# Patient Record
Sex: Female | Born: 1935 | Race: White | Hispanic: No | Smoking: Former smoker
Health system: Southern US, Community
[De-identification: ages and names within clinical notes are randomized; demographics above are authoritative.]

## PROBLEM LIST (undated history)

## (undated) DIAGNOSIS — K624 Stenosis of anus and rectum: Secondary | ICD-10-CM

## (undated) DIAGNOSIS — I4891 Unspecified atrial fibrillation: Secondary | ICD-10-CM

## (undated) DIAGNOSIS — M810 Age-related osteoporosis without current pathological fracture: Secondary | ICD-10-CM

## (undated) DIAGNOSIS — Z974 Presence of external hearing-aid: Secondary | ICD-10-CM

## (undated) DIAGNOSIS — J449 Chronic obstructive pulmonary disease, unspecified: Secondary | ICD-10-CM

## (undated) DIAGNOSIS — Q438 Other specified congenital malformations of intestine: Secondary | ICD-10-CM

## (undated) DIAGNOSIS — Z8719 Personal history of other diseases of the digestive system: Secondary | ICD-10-CM

## (undated) DIAGNOSIS — I10 Essential (primary) hypertension: Secondary | ICD-10-CM

## (undated) DIAGNOSIS — K5730 Diverticulosis of large intestine without perforation or abscess without bleeding: Secondary | ICD-10-CM

## (undated) DIAGNOSIS — K5792 Diverticulitis of intestine, part unspecified, without perforation or abscess without bleeding: Secondary | ICD-10-CM

## (undated) DIAGNOSIS — K529 Noninfective gastroenteritis and colitis, unspecified: Secondary | ICD-10-CM

## (undated) DIAGNOSIS — A310 Pulmonary mycobacterial infection: Secondary | ICD-10-CM

## (undated) DIAGNOSIS — K222 Esophageal obstruction: Secondary | ICD-10-CM

## (undated) DIAGNOSIS — K449 Diaphragmatic hernia without obstruction or gangrene: Secondary | ICD-10-CM

## (undated) DIAGNOSIS — K589 Irritable bowel syndrome without diarrhea: Secondary | ICD-10-CM

## (undated) DIAGNOSIS — J988 Other specified respiratory disorders: Secondary | ICD-10-CM

## (undated) DIAGNOSIS — G4710 Hypersomnia, unspecified: Secondary | ICD-10-CM

## (undated) DIAGNOSIS — K219 Gastro-esophageal reflux disease without esophagitis: Secondary | ICD-10-CM

## (undated) DIAGNOSIS — I2510 Atherosclerotic heart disease of native coronary artery without angina pectoris: Secondary | ICD-10-CM

## (undated) DIAGNOSIS — T4145XA Adverse effect of unspecified anesthetic, initial encounter: Secondary | ICD-10-CM

## (undated) DIAGNOSIS — F411 Generalized anxiety disorder: Secondary | ICD-10-CM

## (undated) DIAGNOSIS — J439 Emphysema, unspecified: Secondary | ICD-10-CM

## (undated) DIAGNOSIS — G4730 Sleep apnea, unspecified: Secondary | ICD-10-CM

## (undated) DIAGNOSIS — N390 Urinary tract infection, site not specified: Secondary | ICD-10-CM

## (undated) DIAGNOSIS — T8859XA Other complications of anesthesia, initial encounter: Secondary | ICD-10-CM

## (undated) DIAGNOSIS — K5909 Other constipation: Secondary | ICD-10-CM

## (undated) DIAGNOSIS — B3781 Candidal esophagitis: Secondary | ICD-10-CM

## (undated) DIAGNOSIS — I7300 Raynaud's syndrome without gangrene: Secondary | ICD-10-CM

## (undated) DIAGNOSIS — K760 Fatty (change of) liver, not elsewhere classified: Secondary | ICD-10-CM

## (undated) DIAGNOSIS — Z8679 Personal history of other diseases of the circulatory system: Secondary | ICD-10-CM

## (undated) DIAGNOSIS — J8410 Pulmonary fibrosis, unspecified: Secondary | ICD-10-CM

## (undated) DIAGNOSIS — C50919 Malignant neoplasm of unspecified site of unspecified female breast: Secondary | ICD-10-CM

## (undated) DIAGNOSIS — J309 Allergic rhinitis, unspecified: Secondary | ICD-10-CM

## (undated) DIAGNOSIS — K8020 Calculus of gallbladder without cholecystitis without obstruction: Secondary | ICD-10-CM

## (undated) HISTORY — DX: Diaphragmatic hernia without obstruction or gangrene: K44.9

## (undated) HISTORY — DX: Sleep apnea, unspecified: G47.30

## (undated) HISTORY — DX: Diverticulosis of large intestine without perforation or abscess without bleeding: K57.30

## (undated) HISTORY — PX: BLADDER SURGERY: SHX569

## (undated) HISTORY — DX: Allergic rhinitis, unspecified: J30.9

## (undated) HISTORY — PX: BILATERAL SALPINGOOPHORECTOMY: SHX1223

## (undated) HISTORY — DX: Pulmonary mycobacterial infection: A31.0

## (undated) HISTORY — DX: Gastro-esophageal reflux disease without esophagitis: K21.9

## (undated) HISTORY — DX: Malignant neoplasm of unspecified site of unspecified female breast: C50.919

## (undated) HISTORY — DX: Personal history of other diseases of the digestive system: Z87.19

## (undated) HISTORY — PX: MOUTH SURGERY: SHX715

## (undated) HISTORY — DX: Irritable bowel syndrome, unspecified: K58.9

## (undated) HISTORY — DX: Diverticulitis of intestine, part unspecified, without perforation or abscess without bleeding: K57.92

## (undated) HISTORY — DX: Chronic obstructive pulmonary disease, unspecified: J44.9

## (undated) HISTORY — DX: Generalized anxiety disorder: F41.1

## (undated) HISTORY — DX: Age-related osteoporosis without current pathological fracture: M81.0

## (undated) HISTORY — DX: Raynaud's syndrome without gangrene: I73.00

## (undated) HISTORY — DX: Fatty (change of) liver, not elsewhere classified: K76.0

## (undated) HISTORY — DX: Noninfective gastroenteritis and colitis, unspecified: K52.9

## (undated) HISTORY — PX: APPENDECTOMY: SHX54

## (undated) HISTORY — PX: TONSILLECTOMY: SUR1361

## (undated) HISTORY — DX: Personal history of other diseases of the circulatory system: Z86.79

## (undated) HISTORY — DX: Unspecified atrial fibrillation: I48.91

## (undated) HISTORY — PX: BREAST CYST EXCISION: SHX579

## (undated) HISTORY — DX: Pulmonary fibrosis, unspecified: J84.10

## (undated) HISTORY — PX: EYE SURGERY: SHX253

## (undated) HISTORY — DX: Hypersomnia, unspecified: G47.10

## (undated) HISTORY — DX: Esophageal obstruction: K22.2

## (undated) HISTORY — DX: Calculus of gallbladder without cholecystitis without obstruction: K80.20

## (undated) HISTORY — PX: SHOULDER OPEN ROTATOR CUFF REPAIR: SHX2407

## (undated) HISTORY — DX: Other specified respiratory disorders: J98.8

## (undated) HISTORY — DX: Candidal esophagitis: B37.81

## (undated) HISTORY — DX: Atherosclerotic heart disease of native coronary artery without angina pectoris: I25.10

## (undated) HISTORY — PX: MELANOMA EXCISION: SHX5266

## (undated) HISTORY — PX: THORACOTOMY: SUR1349

## (undated) HISTORY — DX: Other specified congenital malformations of intestine: Q43.8

## (undated) HISTORY — DX: Emphysema, unspecified: J43.9

## (undated) HISTORY — PX: LUNG SURGERY: SHX703

## (undated) HISTORY — DX: Stenosis of anus and rectum: K62.4

## (undated) HISTORY — DX: Other constipation: K59.09

## (undated) HISTORY — DX: Essential (primary) hypertension: I10

## (undated) HISTORY — PX: ABDOMINAL HYSTERECTOMY: SHX81

---

## 1997-01-11 ENCOUNTER — Encounter: Payer: Self-pay | Admitting: Gastroenterology

## 1997-09-14 ENCOUNTER — Encounter: Admission: RE | Admit: 1997-09-14 | Discharge: 1997-12-13 | Payer: Self-pay | Admitting: Otolaryngology

## 1997-12-25 ENCOUNTER — Encounter: Admission: RE | Admit: 1997-12-25 | Discharge: 1998-03-25 | Payer: Self-pay

## 1998-02-06 ENCOUNTER — Encounter: Admission: RE | Admit: 1998-02-06 | Discharge: 1998-05-02 | Payer: Self-pay | Admitting: Anesthesiology

## 1998-03-12 ENCOUNTER — Encounter: Admission: RE | Admit: 1998-03-12 | Discharge: 1998-06-10 | Payer: Self-pay | Admitting: Anesthesiology

## 1998-09-17 ENCOUNTER — Other Ambulatory Visit: Admission: RE | Admit: 1998-09-17 | Discharge: 1998-09-17 | Payer: Self-pay | Admitting: Gynecology

## 1999-07-05 ENCOUNTER — Encounter: Payer: Self-pay | Admitting: Surgery

## 1999-07-05 ENCOUNTER — Encounter: Admission: RE | Admit: 1999-07-05 | Discharge: 1999-07-05 | Payer: Self-pay | Admitting: Surgery

## 2000-07-24 ENCOUNTER — Encounter: Admission: RE | Admit: 2000-07-24 | Discharge: 2000-07-24 | Payer: Self-pay | Admitting: Surgery

## 2000-07-24 ENCOUNTER — Encounter: Payer: Self-pay | Admitting: Surgery

## 2001-04-02 ENCOUNTER — Encounter: Payer: Self-pay | Admitting: Gastroenterology

## 2001-04-02 ENCOUNTER — Encounter: Admission: RE | Admit: 2001-04-02 | Discharge: 2001-04-02 | Payer: Self-pay | Admitting: Gastroenterology

## 2001-10-22 ENCOUNTER — Encounter: Admission: RE | Admit: 2001-10-22 | Discharge: 2001-10-22 | Payer: Self-pay | Admitting: Critical Care Medicine

## 2001-10-22 ENCOUNTER — Encounter: Payer: Self-pay | Admitting: Critical Care Medicine

## 2001-11-08 ENCOUNTER — Encounter: Admission: RE | Admit: 2001-11-08 | Discharge: 2001-11-08 | Payer: Self-pay | Admitting: Surgery

## 2001-11-08 ENCOUNTER — Encounter: Payer: Self-pay | Admitting: Surgery

## 2002-09-23 ENCOUNTER — Encounter: Payer: Self-pay | Admitting: Internal Medicine

## 2002-09-23 ENCOUNTER — Encounter: Admission: RE | Admit: 2002-09-23 | Discharge: 2002-09-23 | Payer: Self-pay | Admitting: Internal Medicine

## 2003-07-26 ENCOUNTER — Encounter: Admission: RE | Admit: 2003-07-26 | Discharge: 2003-07-26 | Payer: Self-pay | Admitting: Surgery

## 2003-07-28 ENCOUNTER — Ambulatory Visit (HOSPITAL_BASED_OUTPATIENT_CLINIC_OR_DEPARTMENT_OTHER): Admission: RE | Admit: 2003-07-28 | Discharge: 2003-07-28 | Payer: Self-pay | Admitting: Pulmonary Disease

## 2003-11-06 ENCOUNTER — Encounter: Admission: RE | Admit: 2003-11-06 | Discharge: 2003-11-06 | Payer: Self-pay | Admitting: Internal Medicine

## 2003-11-06 ENCOUNTER — Encounter: Payer: Self-pay | Admitting: Internal Medicine

## 2004-03-12 ENCOUNTER — Ambulatory Visit: Payer: Self-pay | Admitting: Critical Care Medicine

## 2004-03-22 ENCOUNTER — Ambulatory Visit: Payer: Self-pay | Admitting: Internal Medicine

## 2004-04-17 ENCOUNTER — Ambulatory Visit: Payer: Self-pay | Admitting: Pulmonary Disease

## 2004-05-09 ENCOUNTER — Ambulatory Visit: Payer: Self-pay | Admitting: Family Medicine

## 2004-05-09 ENCOUNTER — Encounter: Admission: RE | Admit: 2004-05-09 | Discharge: 2004-05-09 | Payer: Self-pay | Admitting: Family Medicine

## 2004-06-10 ENCOUNTER — Ambulatory Visit: Payer: Self-pay | Admitting: Critical Care Medicine

## 2004-07-19 ENCOUNTER — Ambulatory Visit: Payer: Self-pay | Admitting: Internal Medicine

## 2004-08-13 ENCOUNTER — Ambulatory Visit: Payer: Self-pay | Admitting: Internal Medicine

## 2004-08-19 ENCOUNTER — Ambulatory Visit: Payer: Self-pay | Admitting: Critical Care Medicine

## 2004-08-27 ENCOUNTER — Ambulatory Visit: Payer: Self-pay | Admitting: Internal Medicine

## 2004-08-30 ENCOUNTER — Ambulatory Visit: Payer: Self-pay | Admitting: Internal Medicine

## 2004-09-10 ENCOUNTER — Ambulatory Visit: Payer: Self-pay | Admitting: Internal Medicine

## 2004-09-13 ENCOUNTER — Ambulatory Visit: Payer: Self-pay | Admitting: Internal Medicine

## 2004-09-16 ENCOUNTER — Ambulatory Visit: Payer: Self-pay | Admitting: Internal Medicine

## 2004-09-20 ENCOUNTER — Ambulatory Visit: Payer: Self-pay | Admitting: Critical Care Medicine

## 2004-10-11 ENCOUNTER — Ambulatory Visit: Payer: Self-pay | Admitting: Internal Medicine

## 2004-10-18 ENCOUNTER — Ambulatory Visit: Payer: Self-pay | Admitting: Critical Care Medicine

## 2004-11-06 ENCOUNTER — Ambulatory Visit: Payer: Self-pay | Admitting: Internal Medicine

## 2004-12-04 ENCOUNTER — Encounter: Admission: RE | Admit: 2004-12-04 | Discharge: 2004-12-04 | Payer: Self-pay | Admitting: Internal Medicine

## 2004-12-05 ENCOUNTER — Ambulatory Visit: Payer: Self-pay | Admitting: Internal Medicine

## 2004-12-06 ENCOUNTER — Encounter: Payer: Self-pay | Admitting: Gastroenterology

## 2004-12-10 ENCOUNTER — Ambulatory Visit: Payer: Self-pay | Admitting: Internal Medicine

## 2004-12-19 ENCOUNTER — Ambulatory Visit: Payer: Self-pay | Admitting: Critical Care Medicine

## 2005-01-14 ENCOUNTER — Encounter: Payer: Self-pay | Admitting: Gastroenterology

## 2005-03-11 ENCOUNTER — Ambulatory Visit: Payer: Self-pay | Admitting: Critical Care Medicine

## 2005-03-14 ENCOUNTER — Encounter: Admission: RE | Admit: 2005-03-14 | Discharge: 2005-03-14 | Payer: Self-pay | Admitting: Internal Medicine

## 2005-03-18 ENCOUNTER — Ambulatory Visit: Payer: Self-pay | Admitting: Internal Medicine

## 2005-03-19 ENCOUNTER — Ambulatory Visit: Payer: Self-pay | Admitting: Cardiology

## 2005-03-28 ENCOUNTER — Ambulatory Visit: Payer: Self-pay | Admitting: Internal Medicine

## 2005-06-17 ENCOUNTER — Ambulatory Visit: Payer: Self-pay | Admitting: Critical Care Medicine

## 2005-06-23 ENCOUNTER — Ambulatory Visit: Payer: Self-pay | Admitting: Internal Medicine

## 2005-07-21 ENCOUNTER — Ambulatory Visit: Payer: Self-pay | Admitting: Internal Medicine

## 2005-08-04 ENCOUNTER — Encounter: Payer: Self-pay | Admitting: Critical Care Medicine

## 2005-08-06 ENCOUNTER — Ambulatory Visit: Payer: Self-pay | Admitting: Internal Medicine

## 2005-08-13 ENCOUNTER — Ambulatory Visit: Payer: Self-pay | Admitting: Internal Medicine

## 2005-09-11 ENCOUNTER — Encounter: Payer: Self-pay | Admitting: Critical Care Medicine

## 2005-09-16 ENCOUNTER — Ambulatory Visit: Payer: Self-pay | Admitting: Internal Medicine

## 2005-09-22 ENCOUNTER — Ambulatory Visit: Payer: Self-pay | Admitting: Critical Care Medicine

## 2005-10-02 ENCOUNTER — Ambulatory Visit: Payer: Self-pay | Admitting: Internal Medicine

## 2005-10-05 ENCOUNTER — Encounter: Payer: Self-pay | Admitting: Critical Care Medicine

## 2005-10-23 ENCOUNTER — Ambulatory Visit: Payer: Self-pay | Admitting: Pulmonary Disease

## 2005-11-10 ENCOUNTER — Ambulatory Visit: Payer: Self-pay | Admitting: Internal Medicine

## 2005-12-04 ENCOUNTER — Ambulatory Visit (HOSPITAL_BASED_OUTPATIENT_CLINIC_OR_DEPARTMENT_OTHER): Admission: RE | Admit: 2005-12-04 | Discharge: 2005-12-04 | Payer: Self-pay | Admitting: Pulmonary Disease

## 2005-12-12 ENCOUNTER — Ambulatory Visit: Payer: Self-pay | Admitting: Pulmonary Disease

## 2005-12-30 ENCOUNTER — Ambulatory Visit: Payer: Self-pay | Admitting: Internal Medicine

## 2006-01-06 ENCOUNTER — Ambulatory Visit: Payer: Self-pay | Admitting: Critical Care Medicine

## 2006-01-14 ENCOUNTER — Ambulatory Visit: Payer: Self-pay | Admitting: Pulmonary Disease

## 2006-01-28 ENCOUNTER — Ambulatory Visit: Payer: Self-pay | Admitting: Internal Medicine

## 2006-02-24 ENCOUNTER — Ambulatory Visit: Payer: Self-pay

## 2006-03-04 ENCOUNTER — Ambulatory Visit: Payer: Self-pay | Admitting: Internal Medicine

## 2006-03-06 ENCOUNTER — Ambulatory Visit: Payer: Self-pay | Admitting: Internal Medicine

## 2006-03-09 ENCOUNTER — Ambulatory Visit: Payer: Self-pay | Admitting: Critical Care Medicine

## 2006-03-23 ENCOUNTER — Encounter: Admission: RE | Admit: 2006-03-23 | Discharge: 2006-03-23 | Payer: Self-pay | Admitting: Internal Medicine

## 2006-04-14 ENCOUNTER — Ambulatory Visit: Payer: Self-pay | Admitting: Internal Medicine

## 2006-05-08 ENCOUNTER — Ambulatory Visit: Payer: Self-pay | Admitting: Critical Care Medicine

## 2006-05-25 ENCOUNTER — Ambulatory Visit: Payer: Self-pay | Admitting: Critical Care Medicine

## 2006-06-01 ENCOUNTER — Ambulatory Visit: Payer: Self-pay | Admitting: Pulmonary Disease

## 2006-06-23 ENCOUNTER — Ambulatory Visit: Payer: Self-pay | Admitting: Critical Care Medicine

## 2006-06-24 ENCOUNTER — Ambulatory Visit: Payer: Self-pay | Admitting: *Deleted

## 2006-06-30 ENCOUNTER — Ambulatory Visit: Payer: Self-pay | Admitting: Internal Medicine

## 2006-06-30 LAB — CONVERTED CEMR LAB
ALT: 32 units/L (ref 0–40)
AST: 43 units/L — ABNORMAL HIGH (ref 0–37)
Albumin: 3.9 g/dL (ref 3.5–5.2)
Alkaline Phosphatase: 56 units/L (ref 39–117)
BUN: 8 mg/dL (ref 6–23)
Basophils Absolute: 0 10*3/uL (ref 0.0–0.1)
Basophils Relative: 0.3 % (ref 0.0–1.0)
Bilirubin, Direct: 0.1 mg/dL (ref 0.0–0.3)
CO2: 32 meq/L (ref 19–32)
Calcium: 9.7 mg/dL (ref 8.4–10.5)
Chloride: 101 meq/L (ref 96–112)
Cholesterol: 201 mg/dL (ref 0–200)
Creatinine, Ser: 0.9 mg/dL (ref 0.4–1.2)
Direct LDL: 131.5 mg/dL
Eosinophils Absolute: 0.1 10*3/uL (ref 0.0–0.6)
Eosinophils Relative: 1.4 % (ref 0.0–5.0)
GFR calc Af Amer: 77 mL/min
GFR calc non Af Amer: 64 mL/min
Glucose, Bld: 105 mg/dL — ABNORMAL HIGH (ref 70–99)
HCT: 42.6 % (ref 36.0–46.0)
HDL: 57.3 mg/dL (ref >39.0)
Hemoglobin: 14.6 g/dL (ref 12.0–15.0)
Lymphocytes Relative: 27.4 % (ref 12.0–46.0)
MCHC: 34.2 g/dL (ref 30.0–36.0)
MCV: 90.5 fL (ref 78.0–100.0)
Monocytes Absolute: 0.6 10*3/uL (ref 0.2–0.7)
Monocytes Relative: 10.7 % (ref 3.0–11.0)
Neutro Abs: 3.2 10*3/uL (ref 1.4–7.7)
Neutrophils Relative %: 60.2 % (ref 43.0–77.0)
Platelets: 221 10*3/uL (ref 150–400)
Potassium: 4.9 meq/L (ref 3.5–5.1)
RBC: 4.71 M/uL (ref 3.87–5.11)
RDW: 12.8 % (ref 11.5–14.6)
Sodium: 140 meq/L (ref 135–145)
Total Bilirubin: 0.9 mg/dL (ref 0.3–1.2)
Total CHOL/HDL Ratio: 3.5
Total Protein: 6.7 g/dL (ref 6.0–8.3)
Triglycerides: 120 mg/dL (ref 0–149)
VLDL: 24 mg/dL (ref 0–40)
WBC: 5.4 10*3/uL (ref 4.5–10.5)

## 2006-07-28 ENCOUNTER — Ambulatory Visit: Payer: Self-pay | Admitting: Critical Care Medicine

## 2006-08-12 ENCOUNTER — Ambulatory Visit: Payer: Self-pay | Admitting: Critical Care Medicine

## 2006-08-12 LAB — CONVERTED CEMR LAB
ALT: 36 units/L (ref 0–40)
Basophils Absolute: 0 10*3/uL (ref 0.0–0.1)
CK-MB: 2.1 ng/mL (ref 0.3–4.0)
Chloride: 104 meq/L (ref 96–112)
Creatinine, Ser: 0.9 mg/dL (ref 0.4–1.2)
Eosinophils Absolute: 0.1 10*3/uL (ref 0.0–0.6)
Glucose, Bld: 113 mg/dL — ABNORMAL HIGH (ref 70–99)
HCT: 44.1 % (ref 36.0–46.0)
MCHC: 34.5 g/dL (ref 30.0–36.0)
MCV: 89.3 fL (ref 78.0–100.0)
Monocytes Absolute: 0.4 10*3/uL (ref 0.2–0.7)
Monocytes Relative: 7.5 % (ref 3.0–11.0)
Neutrophils Relative %: 61.5 % (ref 43.0–77.0)
Pro B Natriuretic peptide (BNP): 70 pg/mL (ref 0.0–100.0)
RBC: 4.94 M/uL (ref 3.87–5.11)
Sodium: 140 meq/L (ref 135–145)

## 2006-09-24 ENCOUNTER — Ambulatory Visit: Payer: Self-pay | Admitting: Pulmonary Disease

## 2006-10-09 ENCOUNTER — Ambulatory Visit: Payer: Self-pay | Admitting: Internal Medicine

## 2006-10-09 LAB — CONVERTED CEMR LAB
Basophils Absolute: 0 10*3/uL (ref 0.0–0.1)
Basophils Relative: 0.1 % (ref 0.0–1.0)
Eosinophils Absolute: 0.1 10*3/uL (ref 0.0–0.6)
Eosinophils Relative: 0.9 % (ref 0.0–5.0)
Folate: >20 ng/mL
HCT: 46.1 % — ABNORMAL HIGH (ref 36.0–46.0)
Hemoglobin: 15.6 g/dL — ABNORMAL HIGH (ref 12.0–15.0)
Lymphocytes Relative: 28.8 % (ref 12.0–46.0)
MCHC: 33.9 g/dL (ref 30.0–36.0)
MCV: 89.5 fL (ref 78.0–100.0)
Monocytes Absolute: 0.6 10*3/uL (ref 0.2–0.7)
Monocytes Relative: 8.9 % (ref 3.0–11.0)
Neutro Abs: 3.9 10*3/uL (ref 1.4–7.7)
Neutrophils Relative %: 61.3 % (ref 43.0–77.0)
Platelets: 211 10*3/uL (ref 150–400)
RBC: 5.15 M/uL — ABNORMAL HIGH (ref 3.87–5.11)
RDW: 12.8 % (ref 11.5–14.6)
TSH: 1.52 microintl units/mL (ref 0.35–5.50)
Vitamin B-12: 423 pg/mL (ref 211–911)
WBC: 6.5 10*3/uL (ref 4.5–10.5)

## 2006-10-19 ENCOUNTER — Ambulatory Visit: Payer: Self-pay | Admitting: Internal Medicine

## 2006-11-11 ENCOUNTER — Ambulatory Visit: Payer: Self-pay | Admitting: Internal Medicine

## 2006-11-12 DIAGNOSIS — I10 Essential (primary) hypertension: Secondary | ICD-10-CM

## 2006-11-12 HISTORY — DX: Essential (primary) hypertension: I10

## 2006-11-18 ENCOUNTER — Ambulatory Visit: Payer: Self-pay | Admitting: Critical Care Medicine

## 2007-01-27 ENCOUNTER — Ambulatory Visit: Payer: Self-pay | Admitting: Critical Care Medicine

## 2007-02-01 DIAGNOSIS — A310 Pulmonary mycobacterial infection: Secondary | ICD-10-CM

## 2007-02-01 DIAGNOSIS — G4710 Hypersomnia, unspecified: Secondary | ICD-10-CM

## 2007-02-01 DIAGNOSIS — G4730 Sleep apnea, unspecified: Secondary | ICD-10-CM

## 2007-02-01 HISTORY — DX: Pulmonary mycobacterial infection: A31.0

## 2007-02-01 HISTORY — DX: Hypersomnia, unspecified: G47.10

## 2007-02-16 ENCOUNTER — Ambulatory Visit: Payer: Self-pay | Admitting: Internal Medicine

## 2007-02-17 LAB — CONVERTED CEMR LAB
ALT: 40 U/L — ABNORMAL HIGH
AST: 41 U/L — ABNORMAL HIGH
Albumin: 4 g/dL
Alkaline Phosphatase: 60 U/L
BUN: 9 mg/dL
Basophils Absolute: 0 K/uL
Basophils Relative: 0.2 %
Bilirubin, Direct: 0.2 mg/dL
CO2: 32 meq/L
Calcium: 9.5 mg/dL
Chloride: 98 meq/L
Creatinine, Ser: 0.9 mg/dL
Eosinophils Absolute: 0.1 K/uL
Eosinophils Relative: 1.4 %
Folate: >20 ng/mL
GFR calc Af Amer: 77 mL/min
GFR calc non Af Amer: 64 mL/min
Glucose, Bld: 83 mg/dL
HCT: 45 %
Hemoglobin: 15.4 g/dL — ABNORMAL HIGH
Lymphocytes Relative: 26.3 %
MCHC: 34.2 g/dL
MCV: 92.1 fL
Monocytes Absolute: 0.5 K/uL
Monocytes Relative: 8.6 %
Neutro Abs: 4 K/uL
Neutrophils Relative %: 63.5 %
Platelets: 194 K/uL
Potassium: 5.5 meq/L — ABNORMAL HIGH
RBC: 4.88 M/uL
RDW: 12.6 %
Sodium: 137 meq/L
TSH: 1.67 u[IU]/mL
Total Bilirubin: 1.1 mg/dL
Total Protein: 6.5 g/dL
Vitamin B-12: 314 pg/mL
WBC: 6.3 10*3/microliter

## 2007-03-08 ENCOUNTER — Ambulatory Visit: Payer: Self-pay | Admitting: Internal Medicine

## 2007-04-14 ENCOUNTER — Encounter: Payer: Self-pay | Admitting: Internal Medicine

## 2007-04-14 ENCOUNTER — Encounter: Payer: Self-pay | Admitting: Gastroenterology

## 2007-04-19 ENCOUNTER — Ambulatory Visit: Payer: Self-pay | Admitting: Critical Care Medicine

## 2007-04-19 DIAGNOSIS — K219 Gastro-esophageal reflux disease without esophagitis: Secondary | ICD-10-CM

## 2007-04-19 HISTORY — DX: Gastro-esophageal reflux disease without esophagitis: K21.9

## 2007-04-21 ENCOUNTER — Ambulatory Visit: Payer: Self-pay | Admitting: Internal Medicine

## 2007-05-03 ENCOUNTER — Telehealth (INDEPENDENT_AMBULATORY_CARE_PROVIDER_SITE_OTHER): Payer: Self-pay | Admitting: *Deleted

## 2007-05-17 ENCOUNTER — Ambulatory Visit: Payer: Self-pay | Admitting: Internal Medicine

## 2007-06-16 ENCOUNTER — Encounter: Admission: RE | Admit: 2007-06-16 | Discharge: 2007-06-16 | Payer: Self-pay | Admitting: Internal Medicine

## 2007-06-28 ENCOUNTER — Encounter (INDEPENDENT_AMBULATORY_CARE_PROVIDER_SITE_OTHER): Payer: Self-pay | Admitting: Diagnostic Radiology

## 2007-06-28 ENCOUNTER — Encounter: Payer: Self-pay | Admitting: Internal Medicine

## 2007-06-28 ENCOUNTER — Encounter: Admission: RE | Admit: 2007-06-28 | Discharge: 2007-06-28 | Payer: Self-pay | Admitting: Internal Medicine

## 2007-06-29 ENCOUNTER — Telehealth: Payer: Self-pay | Admitting: Internal Medicine

## 2007-07-02 ENCOUNTER — Ambulatory Visit: Payer: Self-pay | Admitting: Critical Care Medicine

## 2007-07-05 ENCOUNTER — Telehealth: Payer: Self-pay | Admitting: Critical Care Medicine

## 2007-07-06 ENCOUNTER — Encounter: Payer: Self-pay | Admitting: Internal Medicine

## 2007-07-07 ENCOUNTER — Ambulatory Visit: Payer: Self-pay | Admitting: Internal Medicine

## 2007-07-08 ENCOUNTER — Encounter: Admission: RE | Admit: 2007-07-08 | Discharge: 2007-07-08 | Payer: Self-pay | Admitting: Internal Medicine

## 2007-07-14 ENCOUNTER — Encounter: Admission: RE | Admit: 2007-07-14 | Discharge: 2007-07-14 | Payer: Self-pay | Admitting: Internal Medicine

## 2007-07-21 ENCOUNTER — Ambulatory Visit (HOSPITAL_COMMUNITY): Admission: RE | Admit: 2007-07-21 | Discharge: 2007-07-22 | Payer: Self-pay | Admitting: Surgery

## 2007-07-21 ENCOUNTER — Encounter: Admission: RE | Admit: 2007-07-21 | Discharge: 2007-07-21 | Payer: Self-pay | Admitting: Surgery

## 2007-07-21 ENCOUNTER — Encounter (INDEPENDENT_AMBULATORY_CARE_PROVIDER_SITE_OTHER): Payer: Self-pay | Admitting: Surgery

## 2007-07-21 ENCOUNTER — Encounter: Payer: Self-pay | Admitting: Internal Medicine

## 2007-07-21 HISTORY — PX: MASTECTOMY: SHX3

## 2007-07-21 HISTORY — PX: BREAST SURGERY: SHX581

## 2007-07-28 ENCOUNTER — Encounter: Payer: Self-pay | Admitting: Internal Medicine

## 2007-07-29 ENCOUNTER — Ambulatory Visit: Payer: Self-pay | Admitting: Oncology

## 2007-08-05 ENCOUNTER — Ambulatory Visit: Admission: RE | Admit: 2007-08-05 | Discharge: 2007-11-03 | Payer: Self-pay | Admitting: Radiation Oncology

## 2007-08-06 ENCOUNTER — Encounter: Payer: Self-pay | Admitting: Internal Medicine

## 2007-08-09 ENCOUNTER — Encounter: Payer: Self-pay | Admitting: Internal Medicine

## 2007-08-19 ENCOUNTER — Ambulatory Visit: Payer: Self-pay | Admitting: Critical Care Medicine

## 2007-09-29 ENCOUNTER — Ambulatory Visit: Payer: Self-pay | Admitting: Critical Care Medicine

## 2007-10-25 ENCOUNTER — Telehealth: Payer: Self-pay | Admitting: Critical Care Medicine

## 2007-10-29 ENCOUNTER — Encounter: Payer: Self-pay | Admitting: Critical Care Medicine

## 2007-11-10 ENCOUNTER — Encounter: Payer: Self-pay | Admitting: Critical Care Medicine

## 2007-11-10 ENCOUNTER — Ambulatory Visit: Payer: Self-pay | Admitting: Internal Medicine

## 2007-11-10 LAB — CONVERTED CEMR LAB
Glucose, Urine, Semiquant: NEGATIVE
Nitrite: NEGATIVE
Protein, U semiquant: NEGATIVE
Urobilinogen, UA: 1

## 2007-11-11 ENCOUNTER — Telehealth: Payer: Self-pay | Admitting: Internal Medicine

## 2007-11-29 ENCOUNTER — Encounter: Payer: Self-pay | Admitting: Critical Care Medicine

## 2007-12-22 ENCOUNTER — Ambulatory Visit: Payer: Self-pay | Admitting: Critical Care Medicine

## 2007-12-28 ENCOUNTER — Telehealth: Payer: Self-pay | Admitting: Internal Medicine

## 2007-12-29 ENCOUNTER — Ambulatory Visit: Payer: Self-pay | Admitting: Internal Medicine

## 2007-12-30 ENCOUNTER — Encounter: Payer: Self-pay | Admitting: Internal Medicine

## 2007-12-30 LAB — CONVERTED CEMR LAB
Chloride: 101 meq/L (ref 96–112)
Direct LDL: 138.7 mg/dL
GFR calc Af Amer: 77 mL/min
GFR calc non Af Amer: 64 mL/min
HDL: 52.5 mg/dL (ref >39.0)
Potassium: 4.4 meq/L (ref 3.5–5.1)
Sodium: 140 meq/L (ref 135–145)
Total CHOL/HDL Ratio: 3.8
VLDL: 17 mg/dL (ref 0–40)

## 2008-02-01 ENCOUNTER — Telehealth: Payer: Self-pay | Admitting: Internal Medicine

## 2008-02-03 ENCOUNTER — Telehealth: Payer: Self-pay | Admitting: Internal Medicine

## 2008-02-03 ENCOUNTER — Encounter: Payer: Self-pay | Admitting: Internal Medicine

## 2008-02-15 ENCOUNTER — Ambulatory Visit: Payer: Self-pay | Admitting: Internal Medicine

## 2008-02-15 DIAGNOSIS — C50919 Malignant neoplasm of unspecified site of unspecified female breast: Secondary | ICD-10-CM

## 2008-02-15 HISTORY — DX: Malignant neoplasm of unspecified site of unspecified female breast: C50.919

## 2008-02-16 ENCOUNTER — Encounter: Payer: Self-pay | Admitting: Internal Medicine

## 2008-03-03 ENCOUNTER — Ambulatory Visit: Payer: Self-pay | Admitting: Critical Care Medicine

## 2008-03-14 ENCOUNTER — Telehealth: Payer: Self-pay | Admitting: Internal Medicine

## 2008-03-14 ENCOUNTER — Encounter: Payer: Self-pay | Admitting: Gastroenterology

## 2008-03-15 ENCOUNTER — Telehealth: Payer: Self-pay | Admitting: Internal Medicine

## 2008-03-16 ENCOUNTER — Telehealth: Payer: Self-pay | Admitting: Gastroenterology

## 2008-03-20 ENCOUNTER — Telehealth: Payer: Self-pay | Admitting: Internal Medicine

## 2008-04-04 DIAGNOSIS — M810 Age-related osteoporosis without current pathological fracture: Secondary | ICD-10-CM | POA: Insufficient documentation

## 2008-04-04 HISTORY — DX: Age-related osteoporosis without current pathological fracture: M81.0

## 2008-04-07 ENCOUNTER — Ambulatory Visit: Payer: Self-pay | Admitting: Internal Medicine

## 2008-04-07 ENCOUNTER — Ambulatory Visit: Payer: Self-pay | Admitting: Gastroenterology

## 2008-04-07 ENCOUNTER — Telehealth: Payer: Self-pay | Admitting: Internal Medicine

## 2008-04-07 DIAGNOSIS — K5730 Diverticulosis of large intestine without perforation or abscess without bleeding: Secondary | ICD-10-CM | POA: Insufficient documentation

## 2008-04-07 HISTORY — DX: Diverticulosis of large intestine without perforation or abscess without bleeding: K57.30

## 2008-04-07 LAB — CONVERTED CEMR LAB
Bilirubin Urine: NEGATIVE
Ketones, urine, test strip: NEGATIVE
Specific Gravity, Urine: 1.02

## 2008-04-08 ENCOUNTER — Encounter: Payer: Self-pay | Admitting: Internal Medicine

## 2008-04-21 ENCOUNTER — Ambulatory Visit: Payer: Self-pay | Admitting: Pulmonary Disease

## 2008-05-03 ENCOUNTER — Ambulatory Visit: Payer: Self-pay | Admitting: Cardiology

## 2008-05-03 ENCOUNTER — Ambulatory Visit: Payer: Self-pay | Admitting: Internal Medicine

## 2008-05-03 ENCOUNTER — Inpatient Hospital Stay (HOSPITAL_COMMUNITY): Admission: AD | Admit: 2008-05-03 | Discharge: 2008-05-04 | Payer: Self-pay | Admitting: Internal Medicine

## 2008-05-10 ENCOUNTER — Ambulatory Visit: Payer: Self-pay | Admitting: Gastroenterology

## 2008-05-16 ENCOUNTER — Ambulatory Visit: Payer: Self-pay | Admitting: Critical Care Medicine

## 2008-05-17 ENCOUNTER — Telehealth: Payer: Self-pay | Admitting: Gastroenterology

## 2008-05-17 ENCOUNTER — Telehealth: Payer: Self-pay | Admitting: Internal Medicine

## 2008-05-19 ENCOUNTER — Ambulatory Visit: Payer: Self-pay | Admitting: Gastroenterology

## 2008-05-19 ENCOUNTER — Encounter: Payer: Self-pay | Admitting: Gastroenterology

## 2008-05-24 ENCOUNTER — Encounter: Payer: Self-pay | Admitting: Gastroenterology

## 2008-05-24 ENCOUNTER — Ambulatory Visit: Payer: Self-pay

## 2008-05-24 ENCOUNTER — Encounter: Payer: Self-pay | Admitting: Internal Medicine

## 2008-05-24 ENCOUNTER — Encounter: Payer: Self-pay | Admitting: Cardiology

## 2008-05-24 ENCOUNTER — Encounter: Payer: Self-pay | Admitting: Critical Care Medicine

## 2008-05-31 ENCOUNTER — Ambulatory Visit: Payer: Self-pay | Admitting: Cardiology

## 2008-06-05 ENCOUNTER — Ambulatory Visit: Payer: Self-pay | Admitting: Internal Medicine

## 2008-06-19 ENCOUNTER — Telehealth: Payer: Self-pay | Admitting: Internal Medicine

## 2008-07-17 ENCOUNTER — Encounter: Admission: RE | Admit: 2008-07-17 | Discharge: 2008-07-17 | Payer: Self-pay | Admitting: Surgery

## 2008-08-16 ENCOUNTER — Ambulatory Visit: Payer: Self-pay | Admitting: Gastroenterology

## 2008-08-16 DIAGNOSIS — F411 Generalized anxiety disorder: Secondary | ICD-10-CM | POA: Insufficient documentation

## 2008-08-16 HISTORY — DX: Generalized anxiety disorder: F41.1

## 2008-08-23 ENCOUNTER — Ambulatory Visit (HOSPITAL_COMMUNITY): Admission: RE | Admit: 2008-08-23 | Discharge: 2008-08-23 | Payer: Self-pay | Admitting: Gastroenterology

## 2008-08-23 DIAGNOSIS — K8020 Calculus of gallbladder without cholecystitis without obstruction: Secondary | ICD-10-CM

## 2008-08-23 HISTORY — DX: Calculus of gallbladder without cholecystitis without obstruction: K80.20

## 2008-08-30 ENCOUNTER — Ambulatory Visit: Payer: Self-pay | Admitting: Internal Medicine

## 2008-09-04 ENCOUNTER — Ambulatory Visit: Payer: Self-pay | Admitting: Critical Care Medicine

## 2008-09-27 ENCOUNTER — Encounter: Payer: Self-pay | Admitting: Gastroenterology

## 2008-09-27 ENCOUNTER — Encounter: Payer: Self-pay | Admitting: Cardiology

## 2008-10-07 DIAGNOSIS — Z8679 Personal history of other diseases of the circulatory system: Secondary | ICD-10-CM | POA: Insufficient documentation

## 2008-10-07 HISTORY — DX: Personal history of other diseases of the circulatory system: Z86.79

## 2008-10-11 ENCOUNTER — Ambulatory Visit: Payer: Self-pay | Admitting: Internal Medicine

## 2008-10-16 LAB — CONVERTED CEMR LAB
Basophils Relative: 0.7 % (ref 0.0–3.0)
Eosinophils Absolute: 0.1 10*3/uL (ref 0.0–0.7)
Eosinophils Relative: 2.9 % (ref 0.0–5.0)
Lymphocytes Relative: 29.1 % (ref 12.0–46.0)
MCHC: 34.1 g/dL (ref 30.0–36.0)
MCV: 93.6 fL (ref 78.0–100.0)
Monocytes Absolute: 0.6 10*3/uL (ref 0.1–1.0)
Neutrophils Relative %: 54.5 % (ref 43.0–77.0)
Platelets: 153 10*3/uL (ref 150.0–400.0)
RBC: 4.41 M/uL (ref 3.87–5.11)
WBC: 4.6 10*3/uL (ref 4.5–10.5)

## 2008-10-17 ENCOUNTER — Ambulatory Visit: Payer: Self-pay | Admitting: Cardiology

## 2008-10-17 DIAGNOSIS — I2510 Atherosclerotic heart disease of native coronary artery without angina pectoris: Secondary | ICD-10-CM

## 2008-10-17 HISTORY — DX: Atherosclerotic heart disease of native coronary artery without angina pectoris: I25.10

## 2008-10-18 ENCOUNTER — Encounter: Payer: Self-pay | Admitting: Internal Medicine

## 2008-10-18 ENCOUNTER — Ambulatory Visit (HOSPITAL_COMMUNITY): Admission: RE | Admit: 2008-10-18 | Discharge: 2008-10-18 | Payer: Self-pay | Admitting: Surgery

## 2008-10-23 ENCOUNTER — Telehealth: Payer: Self-pay | Admitting: Internal Medicine

## 2008-11-06 ENCOUNTER — Ambulatory Visit: Payer: Self-pay | Admitting: Critical Care Medicine

## 2008-11-08 ENCOUNTER — Ambulatory Visit: Payer: Self-pay | Admitting: Internal Medicine

## 2008-11-08 LAB — CONVERTED CEMR LAB
Basophils Relative: 0.2 % (ref 0.0–3.0)
Eosinophils Absolute: 0.1 10*3/uL (ref 0.0–0.7)
Eosinophils Relative: 3.3 % (ref 0.0–5.0)
Hemoglobin: 15 g/dL (ref 12.0–15.0)
Lymphocytes Relative: 29.2 % (ref 12.0–46.0)
MCHC: 34.9 g/dL (ref 30.0–36.0)
Neutro Abs: 2.3 10*3/uL (ref 1.4–7.7)
Neutrophils Relative %: 54.5 % (ref 43.0–77.0)
RBC: 4.63 M/uL (ref 3.87–5.11)
WBC: 4.3 10*3/uL — ABNORMAL LOW (ref 4.5–10.5)

## 2008-12-06 ENCOUNTER — Telehealth (INDEPENDENT_AMBULATORY_CARE_PROVIDER_SITE_OTHER): Payer: Self-pay | Admitting: *Deleted

## 2008-12-08 ENCOUNTER — Encounter: Payer: Self-pay | Admitting: Cardiology

## 2008-12-08 ENCOUNTER — Encounter: Payer: Self-pay | Admitting: Internal Medicine

## 2008-12-29 ENCOUNTER — Ambulatory Visit: Payer: Self-pay | Admitting: Critical Care Medicine

## 2008-12-31 ENCOUNTER — Encounter: Payer: Self-pay | Admitting: Critical Care Medicine

## 2009-01-08 ENCOUNTER — Telehealth: Payer: Self-pay | Admitting: Internal Medicine

## 2009-01-26 ENCOUNTER — Ambulatory Visit: Payer: Self-pay | Admitting: Internal Medicine

## 2009-01-29 LAB — CONVERTED CEMR LAB
ALT: 35 units/L (ref 0–35)
AST: 48 units/L — ABNORMAL HIGH (ref 0–37)
Albumin: 4 g/dL (ref 3.5–5.2)
Alkaline Phosphatase: 65 units/L (ref 39–117)
BUN: 7 mg/dL (ref 6–23)
CO2: 30 meq/L (ref 19–32)
Calcium: 9.3 mg/dL (ref 8.4–10.5)
Chloride: 106 meq/L (ref 96–112)
Creatinine, Ser: 0.9 mg/dL (ref 0.4–1.2)
GFR calc non Af Amer: 63.52 mL/min (ref >60)
Glucose, Bld: 104 mg/dL — ABNORMAL HIGH (ref 70–99)
Potassium: 5.3 meq/L — ABNORMAL HIGH (ref 3.5–5.1)
Sodium: 141 meq/L (ref 135–145)
Total Bilirubin: 1.5 mg/dL — ABNORMAL HIGH (ref 0.3–1.2)
Total Protein: 7.1 g/dL (ref 6.0–8.3)

## 2009-02-07 ENCOUNTER — Telehealth: Payer: Self-pay | Admitting: Internal Medicine

## 2009-02-28 ENCOUNTER — Ambulatory Visit: Payer: Self-pay | Admitting: Internal Medicine

## 2009-03-01 LAB — CONVERTED CEMR LAB
BUN: 8 mg/dL (ref 6–23)
Chloride: 102 meq/L (ref 96–112)
GFR calc non Af Amer: 84.88 mL/min (ref >60)
Potassium: 4.9 meq/L (ref 3.5–5.1)
Sodium: 138 meq/L (ref 135–145)

## 2009-03-09 ENCOUNTER — Ambulatory Visit: Payer: Self-pay | Admitting: Critical Care Medicine

## 2009-04-13 ENCOUNTER — Encounter (INDEPENDENT_AMBULATORY_CARE_PROVIDER_SITE_OTHER): Payer: Self-pay | Admitting: *Deleted

## 2009-04-25 ENCOUNTER — Ambulatory Visit: Payer: Self-pay | Admitting: Internal Medicine

## 2009-04-25 DIAGNOSIS — I7300 Raynaud's syndrome without gangrene: Secondary | ICD-10-CM

## 2009-04-25 HISTORY — DX: Raynaud's syndrome without gangrene: I73.00

## 2009-05-28 ENCOUNTER — Telehealth: Payer: Self-pay | Admitting: Internal Medicine

## 2009-06-19 ENCOUNTER — Ambulatory Visit: Payer: Self-pay | Admitting: Cardiology

## 2009-06-22 ENCOUNTER — Encounter: Admission: RE | Admit: 2009-06-22 | Discharge: 2009-06-22 | Payer: Self-pay | Admitting: Surgery

## 2009-07-02 ENCOUNTER — Telehealth: Payer: Self-pay | Admitting: Internal Medicine

## 2009-07-06 ENCOUNTER — Telehealth: Payer: Self-pay | Admitting: Internal Medicine

## 2009-07-09 ENCOUNTER — Ambulatory Visit: Payer: Self-pay | Admitting: Critical Care Medicine

## 2009-07-12 ENCOUNTER — Telehealth: Payer: Self-pay | Admitting: Internal Medicine

## 2009-07-20 ENCOUNTER — Encounter: Admission: RE | Admit: 2009-07-20 | Discharge: 2009-07-20 | Payer: Self-pay | Admitting: Surgery

## 2009-07-24 ENCOUNTER — Encounter: Payer: Self-pay | Admitting: Internal Medicine

## 2009-08-03 ENCOUNTER — Encounter: Payer: Self-pay | Admitting: Internal Medicine

## 2009-08-03 ENCOUNTER — Encounter: Payer: Self-pay | Admitting: Gastroenterology

## 2009-08-23 ENCOUNTER — Ambulatory Visit: Payer: Self-pay | Admitting: Internal Medicine

## 2009-09-03 ENCOUNTER — Encounter: Payer: Self-pay | Admitting: Gastroenterology

## 2009-09-05 LAB — CONVERTED CEMR LAB
ALT: 20 units/L (ref 0–35)
AST: 27 units/L (ref 0–37)
BUN: 9 mg/dL (ref 6–23)
Basophils Relative: 0.4 % (ref 0.0–3.0)
Bilirubin, Direct: 0.1 mg/dL (ref 0.0–0.3)
Calcium: 9.3 mg/dL (ref 8.4–10.5)
Eosinophils Absolute: 0.1 10*3/uL (ref 0.0–0.7)
Eosinophils Relative: 1.5 % (ref 0.0–5.0)
GFR calc non Af Amer: 72.67 mL/min (ref >60)
Glucose, Bld: 91 mg/dL (ref 70–99)
HCT: 42.9 % (ref 36.0–46.0)
Lymphs Abs: 1.6 10*3/uL (ref 0.7–4.0)
MCHC: 34.6 g/dL (ref 30.0–36.0)
MCV: 92.2 fL (ref 78.0–100.0)
Monocytes Absolute: 0.6 10*3/uL (ref 0.1–1.0)
Platelets: 177 10*3/uL (ref 150.0–400.0)
Sodium: 137 meq/L (ref 135–145)
Total Bilirubin: 0.6 mg/dL (ref 0.3–1.2)
WBC: 6.2 10*3/uL (ref 4.5–10.5)

## 2009-09-12 ENCOUNTER — Encounter: Payer: Self-pay | Admitting: Gastroenterology

## 2009-09-21 ENCOUNTER — Ambulatory Visit: Payer: Self-pay | Admitting: Critical Care Medicine

## 2009-09-21 ENCOUNTER — Encounter: Payer: Self-pay | Admitting: Internal Medicine

## 2009-10-01 ENCOUNTER — Ambulatory Visit: Payer: Self-pay | Admitting: Internal Medicine

## 2009-10-01 ENCOUNTER — Encounter: Payer: Self-pay | Admitting: Internal Medicine

## 2009-10-15 ENCOUNTER — Telehealth: Payer: Self-pay | Admitting: Pulmonary Disease

## 2009-10-17 ENCOUNTER — Encounter: Payer: Self-pay | Admitting: Pulmonary Disease

## 2009-11-26 ENCOUNTER — Telehealth: Payer: Self-pay | Admitting: Critical Care Medicine

## 2009-11-29 ENCOUNTER — Ambulatory Visit: Payer: Self-pay | Admitting: Diagnostic Radiology

## 2009-11-29 ENCOUNTER — Ambulatory Visit (HOSPITAL_BASED_OUTPATIENT_CLINIC_OR_DEPARTMENT_OTHER): Admission: RE | Admit: 2009-11-29 | Discharge: 2009-11-29 | Payer: Self-pay | Admitting: Critical Care Medicine

## 2009-11-29 ENCOUNTER — Ambulatory Visit: Payer: Self-pay | Admitting: Critical Care Medicine

## 2009-11-30 ENCOUNTER — Ambulatory Visit: Payer: Self-pay | Admitting: Internal Medicine

## 2009-12-10 ENCOUNTER — Telehealth (INDEPENDENT_AMBULATORY_CARE_PROVIDER_SITE_OTHER): Payer: Self-pay | Admitting: *Deleted

## 2009-12-17 ENCOUNTER — Ambulatory Visit: Payer: Self-pay | Admitting: Critical Care Medicine

## 2009-12-17 ENCOUNTER — Telehealth (INDEPENDENT_AMBULATORY_CARE_PROVIDER_SITE_OTHER): Payer: Self-pay | Admitting: *Deleted

## 2009-12-19 ENCOUNTER — Telehealth (INDEPENDENT_AMBULATORY_CARE_PROVIDER_SITE_OTHER): Payer: Self-pay | Admitting: *Deleted

## 2009-12-20 ENCOUNTER — Telehealth: Payer: Self-pay | Admitting: Internal Medicine

## 2010-01-04 ENCOUNTER — Ambulatory Visit: Payer: Self-pay | Admitting: Internal Medicine

## 2010-01-04 DIAGNOSIS — J4489 Other specified chronic obstructive pulmonary disease: Secondary | ICD-10-CM

## 2010-01-04 DIAGNOSIS — J439 Emphysema, unspecified: Secondary | ICD-10-CM

## 2010-01-04 DIAGNOSIS — J449 Chronic obstructive pulmonary disease, unspecified: Secondary | ICD-10-CM

## 2010-01-04 HISTORY — DX: Chronic obstructive pulmonary disease, unspecified: J44.9

## 2010-01-04 HISTORY — DX: Other specified chronic obstructive pulmonary disease: J44.89

## 2010-01-09 ENCOUNTER — Ambulatory Visit: Payer: Self-pay | Admitting: Critical Care Medicine

## 2010-01-30 ENCOUNTER — Telehealth (INDEPENDENT_AMBULATORY_CARE_PROVIDER_SITE_OTHER): Payer: Self-pay | Admitting: *Deleted

## 2010-02-25 ENCOUNTER — Encounter: Payer: Self-pay | Admitting: Internal Medicine

## 2010-03-01 ENCOUNTER — Ambulatory Visit: Payer: Self-pay | Admitting: Internal Medicine

## 2010-03-07 ENCOUNTER — Telehealth: Payer: Self-pay | Admitting: Internal Medicine

## 2010-03-11 ENCOUNTER — Encounter
Admission: RE | Admit: 2010-03-11 | Discharge: 2010-04-12 | Payer: Self-pay | Source: Home / Self Care | Admitting: Internal Medicine

## 2010-03-12 ENCOUNTER — Telehealth: Payer: Self-pay | Admitting: Internal Medicine

## 2010-03-13 ENCOUNTER — Encounter: Payer: Self-pay | Admitting: Internal Medicine

## 2010-03-14 ENCOUNTER — Encounter: Payer: Self-pay | Admitting: Internal Medicine

## 2010-03-15 ENCOUNTER — Ambulatory Visit: Payer: Self-pay | Admitting: Critical Care Medicine

## 2010-04-02 ENCOUNTER — Telehealth (INDEPENDENT_AMBULATORY_CARE_PROVIDER_SITE_OTHER): Payer: Self-pay | Admitting: *Deleted

## 2010-04-03 ENCOUNTER — Encounter: Payer: Self-pay | Admitting: Critical Care Medicine

## 2010-04-25 ENCOUNTER — Telehealth: Payer: Self-pay | Admitting: Critical Care Medicine

## 2010-05-11 ENCOUNTER — Ambulatory Visit
Admission: RE | Admit: 2010-05-11 | Discharge: 2010-05-11 | Payer: Self-pay | Source: Home / Self Care | Attending: Family Medicine | Admitting: Family Medicine

## 2010-05-14 ENCOUNTER — Telehealth: Payer: Self-pay | Admitting: Internal Medicine

## 2010-05-15 ENCOUNTER — Ambulatory Visit
Admission: RE | Admit: 2010-05-15 | Discharge: 2010-05-15 | Payer: Self-pay | Source: Home / Self Care | Attending: Critical Care Medicine | Admitting: Critical Care Medicine

## 2010-05-15 DIAGNOSIS — M25569 Pain in unspecified knee: Secondary | ICD-10-CM | POA: Insufficient documentation

## 2010-05-17 ENCOUNTER — Encounter: Payer: Self-pay | Admitting: Critical Care Medicine

## 2010-05-17 ENCOUNTER — Encounter: Payer: Self-pay | Admitting: Internal Medicine

## 2010-05-28 ENCOUNTER — Ambulatory Visit
Admission: RE | Admit: 2010-05-28 | Discharge: 2010-05-28 | Payer: Self-pay | Source: Home / Self Care | Attending: Internal Medicine | Admitting: Internal Medicine

## 2010-06-02 ENCOUNTER — Encounter: Payer: Self-pay | Admitting: Surgery

## 2010-06-02 ENCOUNTER — Encounter: Payer: Self-pay | Admitting: Internal Medicine

## 2010-06-13 NOTE — Assessment & Plan Note (Signed)
Summary: Pulmonary OV   Copy to:  [name] Primary Provider/Referring Provider:  Birdie Sons, MD  CC:  4 month follow up.  states breathing is worse-having increased SOB with activity and chest pressure in left chest-states these sxs have gotten worse over the past 2 months.  states she has a "hacky cough every now and then."  .  History of Present Illness: Pulmonary OV:    This is an 77 yo patient with a history of COPD and mycobacterium avium intracellulare with  chronic granulomatous lung disease.  The pt also has hx of upper airway obstruction with difficult intubation with prior operative procedures. Also hx Breast CA s/p resection and XRT. Hx Gallstone disease with conservative Rx due to difficult intubation status.  March 09, 2009 2:35 PM No new issues.  Dyspnea is at baseline.  Chronic pn drip noted. Pt denies any significant sore throat, nasal congestion or excess secretions, fever, chills, sweats, unintended weight loss, pleurtic or exertional chest pain, orthopnea PND, or leg swelling Pt denies any increase in rescue therapy over baseline, denies waking up needing it or having any early am or nocturnal exacerbations of coughing/wheezing/or dyspnea.   July 09, 2009 3:23 PM since last ov spo2 98% dyspnea has been up and down. saw T wall few weeks ago, BP was soft, cut bp meds in 1/2???  pt did not f/u no real cough now. pt has a dry hacky cough.  pt is running around and trying to get house on the market has a pain on the L side.  had an u/s on gallstones.  gallstones are more numerous and are larger.   Preventive Screening-Counseling & Management  Alcohol-Tobacco     Smoking Status: never  Current Medications (verified): 1)  Aspirin 325 Mg Tabs (Aspirin) .... Once Daily 2)  Oxygen .... 2l At Bedtime 3)  Polyethylene Glycol 1000   Powd (Polyethylene Glycol 1000) .... 1/2 Dose Every Day 4)  Benadryl 25 Mg  Caps (Diphenhydramine Hcl) .... Take 1  Capsule By Mouth At Bedtime 5)  Diazepam 5 Mg Tabs (Diazepam) .... 1/2 Tablet By Mouth At Bedtime 6)  Nitroglycerin 0.4 Mg Subl (Nitroglycerin) .... One Tablet Under Tongue Every 5 Minutes As Needed For Chest Pain---May Repeat Times Three 7)  Afrin Nasal Spray 0.05 % Soln (Oxymetazoline Hcl) .... At Bedtime 8)  Losartan Potassium 100 Mg Tabs (Losartan Potassium) .... Take 1 Tablet By Mouth Once A Day 9)  Actigall 300 Mg Caps (Ursodiol) .... Two Times A Day  Allergies (verified): 1)  ! * Z Pack 2)  Penicillin G Potassium (Penicillin G Potassium) 3)  Spiriva Handihaler (Tiotropium Bromide Monohydrate) 4)  Sulfamethoxazole (Sulfamethoxazole)  Past History:  Past medical, surgical, family and social histories (including risk factors) reviewed, and no changes noted (except as noted below).  Past Medical History: Reviewed history from 01/26/2009 and no changes required. MITRAL VALVE PROLAPSE, HX OF (ICD-V12.50) ATRIAL FIBRILLATION (ICD-427.31) CHEST PAIN (ICD-786.50) COPD (ICD-496)...  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 Hysterectomy age 4 Oophorectomy, bilateral SO age 51 prolapsed bladder S/P surgery granuloma,  pulmonary-thoracotomy Breast Surgery: partial mastectomy R 07/21/07 for breast CA all margins neg and LNs neg Tonsillectomy Appendectomy Rotator Cuff  Repair  Family History: Reviewed history from 04/07/2008 and no changes required. father had parkinsonism. Mother died in her 33s of old age. Cancer in the family  Family History of Colon Cancer:Aunt (diagnosed age 41) Family History of Uterine Cancer:Aunt   Social History: Reviewed history from 04/07/2008 and no changes required. lives alone widow Former Smoker Alcohol Use - yes Illicit Drug Use - no  Smoking Status:  never  Review of Systems       The patient complains of shortness of breath with activity.  The patient denies shortness of breath at rest, productive cough, non-productive cough, coughing up blood, chest pain, irregular heartbeats, acid heartburn, indigestion, loss of appetite, weight change, abdominal pain, difficulty swallowing, sore throat, tooth/dental problems, headaches, nasal congestion/difficulty breathing through nose, sneezing, itching, ear ache, anxiety, depression, hand/feet swelling, joint stiffness or pain, rash, change in color of mucus, and fever.    Vital Signs:  Patient profile:   77 year old female Height:      61 inches Weight:      135 pounds O2 Sat:      98 % on Room air Temp:     97.8 degrees F oral Pulse rate:   70 / minute BP sitting:   118 / 70  (left arm) Cuff size:   regular  Vitals Entered By: Gweneth Dimitri RN (July 09, 2009 3:17 PM)  O2 Flow:  Room air CC: 4 month follow up.  states breathing is worse-having increased SOB with activity and chest pressure in left chest-states these sxs have gotten worse over the past 2 months.  states she has a "hacky cough every now and then."   Comments Medications reviewed with patient Daytime contact number verified with patient. Gweneth Dimitri RN  July 09, 2009 3:17 PM    Physical Exam  Additional Exam:  No acute distress with stable vital signs. HEENT:  Unremarkable.  Oropharynx is clear. LUNG FIELDS:  clear HEART:  There is a regular rhythm without murmur, gallop,  rub. ABDOMEN:  Soft, non-tender EXTREMITIES:  Warm without calf tenderness, cyanosis, clubbing, edema.      Impression & Recommendations:  Problem # 1:  COPD (ICD-496) Assessment Unchanged  doing well Subglottic stenosis,  COPD stable at this time, CXR stable 8/10 plan: cont inhaled meds as prescribed  Medications Added to Medication List This Visit: 1)  Afrin Nasal Spray 0.05 % Soln (Oxymetazoline hcl) .... At bedtime 2)  Actigall 300 Mg Caps (Ursodiol) .... Two times a day  Complete Medication List: 1)  Aspirin 325 Mg Tabs (Aspirin) .... Once daily 2)  Oxygen  .... 2l at bedtime 3)  Polyethylene Glycol 1000 Powd (Polyethylene glycol 1000) .... 1/2 dose every day 4)  Benadryl 25 Mg Caps (Diphenhydramine hcl) .... Take 1 capsule by mouth at bedtime 5)  Diazepam 5 Mg Tabs (Diazepam) .... 1/2 tablet by mouth at bedtime 6)  Nitroglycerin 0.4 Mg Subl (Nitroglycerin) .... One tablet under tongue every 5 minutes as needed for chest pain---may repeat times three 7)  Afrin Nasal Spray 0.05 % Soln (Oxymetazoline hcl) .... At bedtime 8)  Losartan Potassium 100 Mg Tabs (Losartan potassium) .... Take 1 tablet by mouth once a day 9)  Actigall 300 Mg Caps (Ursodiol) .... Two times a day  Other Orders: Est. Patient Level III (46962)  Patient Instructions: 1)  No changes in medications 2)  Return 3 months

## 2010-06-13 NOTE — Assessment & Plan Note (Signed)
Summary: Pulmonary OV   Copy to:  [name] Primary Provider/Referring Provider:  Birdie Sons, MD  CC:  3 mos, Pt C/O dizziness & fattigue X4 wks, pt states she has not had any syncope episides. Pt also complains of SOB upon exertion x 4wks, and she states she is having a productive cough in the morning that brings up a yellowish mucus x 4 weeks as well.Marland Kitchen  History of Present Illness: Pulmonary OV:    This is an 77 yo patient with a history of COPD and mycobacterium avium intracellulare with  chronic granulomatous lung disease.  The pt also has hx of upper airway obstruction with difficult intubation with prior operative procedures. Also hx Breast CA s/p resection and XRT. Hx Gallstone disease with conservative Rx due to difficult intubation status.  March 09, 2009 2:35 PM No new issues.  Dyspnea is at baseline.  Chronic pn drip noted. Pt denies any significant sore throat, nasal congestion or excess secretions, fever, chills, sweats, unintended weight loss, pleurtic or exertional chest pain, orthopnea PND, or leg swelling Pt denies any increase in rescue therapy over baseline, denies waking up needing it or having any early am or nocturnal exacerbations of coughing/wheezing/or dyspnea.   July 09, 2009 3:23 PM since last ov spo2 98% dyspnea has been up and down. saw T wall few weeks ago, BP was soft, cut bp meds in 1/2???  pt did not f/u no real cough now. pt has a dry hacky cough.  pt is running around and trying to get house on the market has a pain on the L side.  had an u/s on gallstones.  gallstones are more numerous and are larger.  Sep 21, 2009 2:42 PM  [name] to [facility]  for opinion for GB surgery.  Saw a PA .   Surgeon said no further surgery. Is still on Actigall.  Dr Ezzard Standing.   Also rx protonx has scar in LLs  and nodules due to MAI Still dyspneic and has dry cough.   Pt is still on nasal oxygen 2L.    Clinical Reports  Reviewed:  PFT's:  12/31/2008: FEF 25/75 %Predicted:  90 FEV1 %Predicted:  108 FVC %Predicted:  86   Preventive Screening-Counseling & Management  Alcohol-Tobacco     Smoking Status: quit > 6 months  Current Medications (verified): 1)  Aspirin 325 Mg Tabs (Aspirin) .... Once Daily 2)  Oxygen .... 2l At Bedtime 3)  Polyethylene Glycol 1000   Powd (Polyethylene Glycol 1000) .... 1/2 Dose Every Day 4)  Benadryl 25 Mg  Caps (Diphenhydramine Hcl) .... Take 1 Capsule By Mouth At Bedtime 5)  Nitroglycerin 0.4 Mg Subl (Nitroglycerin) .... One Tablet Under Tongue Every 5 Minutes As Needed For Chest Pain---May Repeat Times Three 6)  Afrin Nasal Spray 0.05 % Soln (Oxymetazoline Hcl) .... At Bedtime As Needed 7)  Losartan Potassium 100 Mg Tabs (Losartan Potassium) .... Take 1 Tablet By Mouth Once A Day  Allergies: 1)  ! * Z Pack 2)  ! * Shellfish 3)  Penicillin G Potassium (Penicillin G Potassium) 4)  Spiriva Handihaler (Tiotropium Bromide Monohydrate) 5)  Sulfamethoxazole (Sulfamethoxazole)  Past History:  Past medical, surgical, family and social histories (including risk factors) reviewed, and no changes noted (except as noted below).  Past Medical History: Reviewed history from 01/26/2009 and no changes required. MITRAL VALVE PROLAPSE, HX OF (ICD-V12.50) ATRIAL FIBRILLATION (ICD-427.31) CHEST PAIN (ICD-786.50) COPD (ICD-496)...  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 Hysterectomy age 16 Oophorectomy, bilateral SO age 30 prolapsed bladder S/P surgery granuloma, pulmonary-thoracotomy Breast Surgery: partial mastectomy R 07/21/07 for breast CA all margins neg and LNs neg Tonsillectomy Appendectomy Rotator Cuff Repair  Family History: Reviewed history from 04/07/2008 and no changes required. father had parkinsonism. Mother died in her 53s of old age. Cancer in the family  Family History of Colon Cancer:Aunt (diagnosed age 10) Family History of Uterine Cancer:Aunt   Social History: Reviewed history from 04/07/2008 and no changes required. lives alone widow Former Smoker Alcohol Use - yes Illicit Drug Use - no  Smoking Status:  quit > 6 months  Review of Systems       The patient complains of shortness of breath with activity and non-productive cough.  The patient denies shortness of breath at rest, productive cough, coughing up blood, chest pain, irregular heartbeats, acid heartburn, indigestion, loss of appetite, weight change, abdominal pain, difficulty swallowing, sore throat, tooth/dental problems, headaches, nasal congestion/difficulty breathing through nose, sneezing, itching, ear ache, anxiety, depression, hand/feet swelling, joint stiffness or pain, rash, change in color of mucus, and fever.    Vital Signs:  Patient profile:   77 year old female Height:      61 inches Weight:      134 pounds O2 Sat:      96 % on room air Temp:     97.9 degrees F oral Pulse rate:   66 / minute BP sitting:   120 / 68  (left arm) Cuff size:   regular  Vitals Entered By: Denna Haggard, CMA (Sep 21, 2009 2:32 PM)  O2 Sat at Rest %:  96% O2 Flow:  room air  Reason for Visit 3 month ROV patient states she thinks her new problems are accompanied by her moving from her home and being exposed to dust. Denna Haggard, CMA  Sep 21, 2009  2:37 PM   Physical Exam  Additional Exam:  No acute distress with stable vital signs. HEENT:  Unremarkable.  Oropharynx is clear. LUNG FIELDS:  clear HEART:  There is a regular rhythm without murmur, gallop, rub. ABDOMEN:  Soft, non-tender EXTREMITIES:  Warm without calf tenderness, cyanosis, clubbing, edema.      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 Patient Level III (09323)  Problem # 2:  GERD (ICD-530.81) Assessment: Unchanged  On ppi  Orders: Est. Patient Level IV (55732)  Problem # 3:  COPD (ICD-496) Assessment: Unchanged stable copd golds stage II,  spirometry stable,  historically intolerant of DPIs plan no changes  Complete Medication List: 1)  Aspirin  325 Mg Tabs (Aspirin) .... Once daily 2)  Oxygen  .... 2l at bedtime 3)  Polyethylene Glycol 1000 Powd (Polyethylene glycol 1000) .... 1/2 dose every day 4)  Benadryl 25 Mg Caps (Diphenhydramine hcl) .... Take 1 capsule by mouth at bedtime 5)  Nitroglycerin 0.4 Mg Subl (Nitroglycerin) .... One tablet under tongue every 5 minutes as needed for chest pain---may repeat times three 6)  Afrin Nasal Spray 0.05 % Soln (Oxymetazoline hcl) .... At bedtime as needed 7)  Losartan Potassium 100 Mg Tabs (Losartan potassium) .... Take 1 tablet by mouth once a day  Patient Instructions: 1)  No change in medications 2)  Return in     3     months     CardioPerfect Spirometry  ID: [id number] Patient: [name] [name], [name] [name] DOB: 1923/08/05 Age: 77 Years Old Sex: Female Race: White Physician: Birdie Sons  MD Height: 61 Weight: 134 Status: Confirmed Past Medical History:  MITRAL VALVE PROLAPSE, HX OF (ICD-V12.50) ATRIAL FIBRILLATION (ICD-427.31) CHEST PAIN (ICD-786.50) COPD (ICD-496)...  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

## 2010-06-13 NOTE — Assessment & Plan Note (Signed)
Summary: 4 month rov/njrpt rescd from [facility]//ccm   Vital Signs:  Patient profile:   77 year old female Weight:      133 pounds Temp:     98.8 degrees F oral Pulse rate:   72 / minute Pulse rhythm:   regular Resp:     12 per minute BP sitting:   106 / 78  (left arm) Cuff size:   regular  Vitals Entered By: Gladis Riffle, RN (August 23, 2009 12:19 PM) CC: 4 month rov--discuss [facility] consult--takes 1/2 diazepam at bedtime and another 1/2 when wakes up in middle of night per self Is Patient Diabetic? No   Primary Care Provider:  Birdie Sons, MD  CC:  4 month rov--discuss [facility] consult--takes 1/2 diazepam at bedtime and another 1/2 when wakes up in middle of night per self.  History of Present Illness:  Follow-Up Visit      This is an 77 year old woman who presents for Follow-up visit.  The patient denies chest pain and palpitations.  Since the last visit the patient notes being seen by a specialist.  The patient reports taking meds as prescribed.  When questioned about possible medication side effects, the patient notes none.  reviewed [facility] note regarding gall stones (discussed with dr Delford Field previously). She has noted that abdominal discomfort related to cholelithiasis have minimized.   All other systems reviewed and were negative   Preventive Screening-Counseling & Management  Alcohol-Tobacco     Smoking Status: never     Year Started: 1942     Year Quit: 1990     Pack years: 21  Current Problems (verified): 1)  Raynaud's Syndrome  (ICD-443.0) 2)  Cad, Unspecified Site  (ICD-414.00) 3)  Mitral Valve Prolapse, Hx of  (ICD-V12.50) 4)  COPD  (ICD-496) 5)  Hypertension  (ICD-401.9) 6)  Gerd  (ICD-530.81) 7)  Cholelithiasis  (ICD-574.20) 8)  Diverticulosis-colon  (ICD-562.10) 9)  Osteoporosis  (ICD-733.00) 10)  Airway Obstruction  (ICD-519.8) 11)  Neoplasm, Malignant, Breast, Right  (ICD-174.9) 12)  Hypersomnia, Associated With Sleep Apnea  (ICD-780.53) 13)  Disease,  Pulmonary D/t Mycobacteria  (ICD-031.0) 14)  Allergic Rhinitis  (ICD-477.9)  Current Medications (verified): 1)  Aspirin 325 Mg Tabs (Aspirin) .... Once Daily 2)  Oxygen .... 2l At Bedtime 3)  Polyethylene Glycol 1000   Powd (Polyethylene Glycol 1000) .... 1/2 Dose Every Day 4)  Benadryl 25 Mg  Caps (Diphenhydramine Hcl) .... Take 1 Capsule By Mouth At Bedtime 5)  Diazepam 5 Mg Tabs (Diazepam) .... 1/2 Tablet By Mouth At Bedtime, Repeats Once As Needed 6)  Nitroglycerin 0.4 Mg Subl (Nitroglycerin) .... One Tablet Under Tongue Every 5 Minutes As Needed For Chest Pain---May Repeat Times Three 7)  Afrin Nasal Spray 0.05 % Soln (Oxymetazoline Hcl) .... At Bedtime As Needed 8)  Losartan Potassium 100 Mg Tabs (Losartan Potassium) .... Take 1 Tablet By Mouth Once A Day 9)  Actigall 300 Mg Caps (Ursodiol) .... Two Times A Day 10)  Nasacort Aq 55 Mcg/act Aers (Triamcinolone Acetonide(Nasal)) .... One Spray Each Nostril At Bedtime  Allergies: 1)  ! * Z Pack 2)  ! * Shellfish 3)  Penicillin G Potassium (Penicillin G Potassium) 4)  Spiriva Handihaler (Tiotropium Bromide Monohydrate) 5)  Sulfamethoxazole (Sulfamethoxazole)  Past History:  Past Medical History: Last updated: 01/26/2009 MITRAL VALVE PROLAPSE, HX OF (ICD-V12.50) ATRIAL FIBRILLATION (ICD-427.31) CHEST PAIN (ICD-786.50) COPD (ICD-496)...  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 Mother died in her 42s of old age. Cancer in the family  Family History of Colon Cancer:Aunt (diagnosed age 72) Family History of Uterine Cancer:Aunt   Social History: Last updated: 04/07/2008 lives alone widow Former Smoker Alcohol Use - yes Illicit Drug Use - no   Risk Factors: Smoking Status: never (08/23/2009)  Physical Exam  General:  Well developed, well nourished, in no acute distress. Head:  normocephalic and atraumatic.   Eyes:  pupils equal and pupils round.   Ears:  R ear normal and L ear normal.   Nose:  no external deformity and no external erythema.   Neck:  No deformities, masses, or tenderness noted. Chest Wall:  no deformities.   Lungs:  normal respiratory effort and no intercostal retractions.   Heart:  normal rate and regular rhythm.   Abdomen:  soft and non-tender.   Msk:  No deformity or scoliosis noted of thoracic or lumbar spine.   Neurologic:  cranial nerves II-XII intact and gait normal.     Impression & Recommendations:  Problem # 1:  CHOLELITHIASIS (ICD-574.20)  minimal sxs if any conitnue Actigall she had a Contrast CT---will [facility] from [facility]  Orders: Venipuncture (16109) TLB-Hepatic/Liver Function Pnl (80076-HEPATIC) TLB-CBC Platelet - w/Differential (85025-CBCD)  Problem # 2:  GERD (ICD-530.81) she is not taking any PPI and is doing well   Problem # 3:  HYPERTENSION (ICD-401.9)  controlled continue current medications  Her updated medication list for this problem includes:    Losartan Potassium 100 Mg Tabs (Losartan potassium) .Marland Kitchen... Take 1 tablet by mouth once a day  BP  today: 106/78 Prior BP: 118/70 (07/09/2009)  Labs Reviewed: K+: 4.9 (02/28/2009) Creat: : 0.7 (02/28/2009)   Chol: 201 (12/29/2007)   HDL: 52.5 (12/29/2007)   LDL: DEL (12/29/2007)   TG: 84 (12/29/2007)  Orders: TLB-BMP (Basic Metabolic Panel-BMET) (80048-METABOL)  Problem # 4:  NEOPLASM, MALIGNANT, BREAST, RIGHT (ICD-174.9) no known recurrence  Problem # 5:  CAD, UNSPECIFIED SITE (ICD-414.00) nonobstructive dzs Her updated medication list for this problem includes:    Aspirin 325 Mg Tabs (Aspirin) ..... Once daily    Nitroglycerin 0.4 Mg Subl (Nitroglycerin) ..... One tablet under tongue every 5 minutes as needed for chest pain---may repeat times three    Losartan Potassium 100 Mg Tabs (Losartan potassium) .Marland Kitchen... Take 1 tablet by mouth once a day she has a significant amount of stress related to life changes (moving, selling a home, etc...) she describes a reaction to shellfish  Complete Medication List: 1)  Aspirin 325 Mg Tabs (Aspirin) .... Once daily 2)  Oxygen  .... 2l at bedtime 3)  Polyethylene Glycol 1000 Powd (Polyethylene glycol 1000) .... 1/2 dose every day 4)  Benadryl 25 Mg Caps (Diphenhydramine hcl) .... Take 1 capsule by mouth at bedtime 5)  Diazepam 5 Mg Tabs (Diazepam) .... Take 1 tab by mouth at bedtime 6)  Nitroglycerin 0.4 Mg Subl (Nitroglycerin) .... One tablet under tongue  every 5 minutes as needed for chest pain---may repeat times three 7)  Afrin Nasal Spray 0.05 % Soln (Oxymetazoline hcl) .... At bedtime as needed 8)  Losartan Potassium 100 Mg Tabs (Losartan potassium) .... Take 1 tablet by mouth once a day 9)  Actigall 300 Mg Caps (Ursodiol) .... Two times a day 10)  Nasacort Aq 55 Mcg/act Aers (Triamcinolone acetonide(nasal)) .... One spray each nostril at bedtime  Patient Instructions: 1)  Please schedule a follow-up appointment in 4 months. Prescriptions: DIAZEPAM 5 MG TABS (DIAZEPAM) Take 1 tab by mouth at bedtime  #30 x 3   Entered and Authorized  by:   Birdie Sons MD   Signed by:   Birdie Sons MD on 08/23/2009   Method used:   Print then Give to Patient   RxID:   [id number]

## 2010-06-13 NOTE — Progress Notes (Signed)
Summary: returned call  Phone Note Call from Patient Call back at Home Phone [phone]   Caller: Patient Call For: wright Summary of Call: pt says someone called her but she couldn't get to the phone.  Initial call taken by: Tivis Ringer, CNA,  December 19, 2009 12:51 PM  Follow-up for Phone Call        I do not see anything in EMR as to why the patient may have been called. PW or Crystal, did either one of you call the patient, if so please advise and I dont mind calling her for you. Thanks.Reynaldo Minium CMA  December 19, 2009 2:42 PM   I have not tried to call this pt.  Dr. Delford Field, did you try calling her? Gweneth Dimitri RN  December 19, 2009 3:55 PM   Additional Follow-up for Phone Call Additional follow up Details #1::        I have not tried to call this pt Additional Follow-up by: Storm Frisk MD,  December 19, 2009 4:35 PM    Additional Follow-up for Phone Call Additional follow up Details #2::    Patient is aware no one has tried to call her from this office. She says meant to call Dr. Cato Mulligan office. Follow-up by: Michel Bickers CMA,  December 19, 2009 4:54 PM

## 2010-06-13 NOTE — Consult Note (Signed)
Summary: [facility]  [facility]   Imported By: Maryln Gottron 08/29/2009 14:35:33  _____________________________________________________________________  External Attachment:    Type:   Image     Comment:   External Document

## 2010-06-13 NOTE — Progress Notes (Signed)
Summary: TALK TO NURSE  Phone Note Call from Patient   Caller: Patient Call For: Tinsley Everman Summary of Call: PT HAD PNUEMONIA WHILE ON VACATION WAS TREATED AND FEELING BETTER WOULD LIKE TO KNOW IF SHE NEED TO HAVE AN OV SOONER THAN 8/8. Initial call taken by: Rickard Patience,  November 26, 2009 8:59 AM  Follow-up for Phone Call        called and spoke with pt.  pt states she recently got back from vacation to [place].  Pt states when she left for this trip she noted a sore throat.  Pt states while in [place] sore throat worsened and she was seen by urgent care MD and dx with sinus infection and given a zpak.  Pt states she "got worse" over the days and went to another urgent care and was in "acute critical condition" with sats in the 60s and dx with pna.  Pt states she was hospitalized at [facility] (pt has hosp records with her) x 6 days.  Pt states she was d/c'd from hosp on 11/10/2009.  Pt flew back to [place] yesterday.  Pt states she is now "feeling better."  States her sats are 94% RA.  Pt denies a cough, sore throat or increased sob.  Pt states she just feels "weak."  Pt has pending appt with PW for 12-17-2009.  Pt wanted to know since these recent events if he wanted to see her sooner.   Lorain Childes- pt does have an appt with Dr. Cato Mulligan this Friday 7-22).  Pelase advise.  Aundra Millet Reynolds LPN  November 26, 2009 10:17 AM   Additional Follow-up for Phone Call Additional follow up Details #1::        she will need to be seen this week ?[place]? CXR first  Additional Follow-up by: Storm Frisk MD,  November 26, 2009 10:21 AM  New Problems: PNEUMONIA, COMMUNITY ACQUIRED, PNEUMOCOCCAL (ICD-481)   Additional Follow-up for Phone Call Additional follow up Details #2::    called and spoke with pt and she agreed to appt on thursday am with PW in [facility] office--pt is aware to arrive a little early and have cxr prior to ov.  gave pt address and phone number in case she gets lost. Randell Loop CMA  November 26, 2009 5:07 PM   New Problems: PNEUMONIA, COMMUNITY ACQUIRED, PNEUMOCOCCAL (ICD-481)

## 2010-06-13 NOTE — Progress Notes (Signed)
Summary: records/triage   Phone Note Call from Patient Call back at Home Phone [phone]   Caller: Patient Call For: Nerea Bordenave Reason for Call: Talk to Nurse Details for Reason: records/triage Summary of Call: would like status on M?R from Dr Aurora Mask office ---pt has Colitis and needs to be seen soon  Initial call taken by: Guadlupe Spanish [facility],  March 16, 2008 9:51 AM  Follow-up for Phone Call        Pt wanted to know if we have recieved records from Dr. Jennye Boroughs office. I informed pt that we do not have them yet. Pt demanded to be seen tomorrow by Dr. Russella Dar. I told pt that Dr. Russella Dar will not see her until we have the records to review. I told pt to call her PCP to be seen sooner and she states she tried and they refused her a appt and referred her to [place]. Pt states she will call Dr. Jennye Boroughs office and will call me back.  Follow-up by: Christie Nottingham CMA,  March 16, 2008 10:05 AM  Additional Follow-up for Phone Call Additional follow up Details #1::        Pt states that Dr. Jennye Boroughs office mailed records to [place] on Tuesday. I told pt that we have not recieved the records yet but as soon as the doctor reviews them and accepts her as a pt then we can schedule her appt. Pt verbalized understanding.  Additional Follow-up by: Christie Nottingham CMA,  March 16, 2008 12:13 PM      Appended Document: records/triage spoke to the patient again today and she would like to know if we have recieved her records yet, I advised her that we have not and Marchelle Folks would call her after Dr. Russella Dar has reviewed the records.

## 2010-06-13 NOTE — Progress Notes (Signed)
Summary: speak to nurse  Phone Note Call from Patient Call back at Home Phone [phone]   Caller: Patient Call For: wright Summary of Call: Wants to order a portable O2 meter from [facility], pls advise. Initial call taken by: Darletta Moll,  December 10, 2009 3:30 PM  Follow-up for Phone Call        Spoke with pt.  She states would like order for her own pulse ox.  She feels like she would really benefit this and wants order sent asap.  Will forward to doc of the day in PW's absense.  Please advise, thanks! Follow-up by: Vernie Murders,  December 10, 2009 4:41 PM  Additional Follow-up for Phone Call Additional follow up Details #1::        We can give her a script for this, but it wont always matter (won't always help w insurance, etc). Sh ecan buy one at pharmacy but they are costly. We can write her a script if she wants to take it with he rto pharm.  Additional Follow-up by: Leslye Peer MD,  December 10, 2009 5:31 PM    Additional Follow-up for Phone Call Additional follow up Details #2::    called and spoke with pt.  pt aware of RB's response.  Pt would still like rx for pulse ox.  had RB sign rx for pulse ox and mailed to pt's home adress per pt's request which i verified we had correct home address on file.  Arman Filter LPN  December 10, 2009 5:38 PM    Appended Document: speak to nurse agree  pw

## 2010-06-13 NOTE — Assessment & Plan Note (Signed)
Summary: leg pain//ccm   Vital Signs:  Patient profile:   77 year old female Weight:      136 pounds Temp:     98.6 degrees F oral Pulse rate:   72 / minute Pulse rhythm:   regular BP sitting:   142 / 88  (left arm) Cuff size:   regular  Vitals Entered By: Alfred Levins, CMA (May 28, 2010 11:11 AM) CC: f/u on leg pain   Primary Care Provider:  Birdie Sons, MD  CC:  f/u on leg pain.  History of Present Illness: reviewed Dr. Ernst Spell note from December she comes in now complaining of left knee pain---she starts her hx with a long description of a knee injury in the 1980s. she tells me that she had a fractured tibia at that s he---no surgical intervention.   She states thatshe fell this fall when trimming a tree. She has some discomfort all of the time and notes most of the pain when walking.  Pain intensity: 8/10 at times.  she tells me that she has seen ortho: I have called for the consult note. she has tried [facility]  She has a list of medical concerns: she wonders about b12 deficiency, she wonders about OA of back, she complains of fatigue  Current Medications (verified): 1)  Aspirin 325 Mg Tabs (Aspirin) .... Once Daily 2)  Oxygen .... 2l At Night, You May Take Oxygen Off At Rest 3)  Polyethylene Glycol 1000   Powd (Polyethylene Glycol 1000) .... 1/2 Dose Every Day 4)  Nitroglycerin 0.4 Mg Subl (Nitroglycerin) .... One Tablet Under Tongue Every 5 Minutes As Needed For Chest Pain---May Repeat Times Three 5)  Losartan Potassium 100 Mg Tabs (Losartan Potassium) .... Take 1 Tablet By Mouth Once A Day 6)  Protonix 40 Mg Tbec (Pantoprazole Sodium) .... Qd 7)  Spiriva Handihaler 18 Mcg Caps (Tiotropium Bromide Monohydrate) .... Once Daily 8)  Diazepam 5 Mg Tabs (Diazepam) .... Take 1 Tablet By Mouth At Bedtime As Needed 9)  Nasonex 50 Mcg/act Susp (Mometasone Furoate) .Marland Kitchen.. 1 Spray Each Nostril Once Daily 10)  Aleve 220 Mg Tabs (Naproxen Sodium) .... As Needed  Allergies  (verified): 1)  ! * Z Pack 2)  ! * Shellfish 3)  ! * Shrimp 4)  Penicillin G Potassium (Penicillin G Potassium) 5)  Spiriva Handihaler (Tiotropium Bromide Monohydrate) 6)  Sulfamethoxazole (Sulfamethoxazole)  Past History:  Past Medical History: Last updated: 03/01/2010 MITRAL VALVE PROLAPSE, HX OF (ICD-V12.50) ATRIAL FIBRILLATION (ICD-427.31) COPD (ICD-496)...  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 Mother died in her 47s of old age. Cancer in the family  Family History of Colon Cancer:Aunt (diagnosed age 46) Family History of Uterine Cancer:Aunt   Social History: Last updated: 04/07/2008 lives alone widow Former Smoker Alcohol Use - yes Illicit Drug Use - no   Risk Factors: Smoking Status: quit > 6 months (01/09/2010)  Physical Exam  General:   elderly female in no acute distress. HEENT exam atraumatic,  normocephalic, neck supple. Chest clear to auscultation cardiac exam S1-S2 are regular. Extremities she has no edema. Musculoskeletal exam she has full range of motion of both knees. I do think she has a left knee joint effusion. nontender to palpation.   Impression & Recommendations:  Problem # 1:  KNEE PAIN, LEFT (ICD-719.46)  Her  updated medication list for this problem includes:    Aspirin 325 Mg Tabs (Aspirin) ..... Once daily  Complete Medication List: 1)  Aspirin 325 Mg Tabs (Aspirin) .... Once daily 2)  Oxygen  .... 2l at night, you may take oxygen off at rest 3)  Polyethylene Glycol 1000 Powd (Polyethylene glycol 1000) .... 1/2 dose every day 4)  Nitroglycerin 0.4 Mg Subl (Nitroglycerin) .... One tablet under tongue every 5 minutes as needed for chest pain---may repeat times three 5)  Losartan Potassium 100 Mg Tabs (Losartan potassium) .... Take 1 tablet by mouth once a day 6)  Protonix 40 Mg Tbec (Pantoprazole sodium) .... Qd 7)  Spiriva Handihaler 18 Mcg Caps (Tiotropium bromide monohydrate) .... Once daily 8)  Diazepam 5 Mg Tabs (Diazepam) .... Take 1 tablet by mouth at bedtime as needed 9)  Nasonex 50 Mcg/act Susp (Mometasone furoate) .Marland Kitchen.. 1 spray each nostril once daily 10)  Meloxicam 7.5 Mg Tabs (Meloxicam) .... One by mouth  with food  Patient Instructions: 1)  if sxs persist for 3-4 weeks call and we will consider steroid injection.  Prescriptions: MELOXICAM 7.5 MG  TABS (MELOXICAM) one by mouth  with food  #30 x 0   Entered and Authorized by:   Birdie Sons MD   Signed by:   Birdie Sons MD on 05/28/2010   Method used:   Electronically to        [facility]. [phone]* (retail)       [phone]       [place], [place]  [address]       Ph: [phone]       Fax: [phone]   RxID:   [id number]    Orders Added: 1)  Est. Patient Level III [66440]

## 2010-06-13 NOTE — Progress Notes (Signed)
Summary: letter for airline-LMTCB x 1  Phone Note Call from Patient Call back at Home Phone [phone]   Caller: Patient Call For: wright Summary of Call: pt leaves for [place] in 1 wk. needs a letter of medical necessity re:" travel equipment converter"/ O2  that she uses at night. pt wants this mailed to her home address.  Initial call taken by: Tivis Ringer, CNA,  October 15, 2009 12:39 PM  Follow-up for Phone Call        [facility] Vernie Murders  October 15, 2009 3:15 PM   pt returned call Follow-up by: Eugene Gavia,  October 16, 2009 9:57 AM  Additional Follow-up for Phone Call Additional follow up Details #1::        pt states she only uses oxygen 2 liters at night, but she wants to carry her portable converter onthe plane with her. She will be out of town x 2 weeks. She states even though she will not be using the oxygen on the plane she still needs a letter of medical nec. in order to carry converter on the plane. She is scheduled to fly out on 10-28-09. i advised that PW is out of office until 10-22-09. Pt is concerned this will be cutting it too close to when she is set to fly and wants Dr. Shelle Iron asked if he will write the letter because she has seen him in the past for sleep. According to IDX pt last saw [facility] in 2008. I advised I will ask KC, but if he cannot do letter then we will have to wait until PW arrives in office. Pt staets understanding. KC please advise if you are comfortable writing letter. Carron Curie CMA  October 16, 2009 10:12 AM      Additional Follow-up for Phone Call Additional follow up Details #2::    done.  put in triage box Follow-up by: Barbaraann Share MD,  October 17, 2009 5:49 PM   Appended Document: letter for airline-LMTCB x 1 Letter mailed to pt. pt aware. copy sent to be scanned.

## 2010-06-13 NOTE — Progress Notes (Signed)
Summary: [facility] rx  Phone Note Refill Request   Refills Requested: Medication #1:  PROTONIX 40 MG TBEC qd pt needs 90 day rx with 3 refills fax to [phone]  Initial call taken by: Heron Sabins,  December 20, 2009 11:38 AM  Follow-up for Phone Call        see Rx. Follow-up by: Gladis Riffle, RN,  December 20, 2009 11:54 AM    Prescriptions: PROTONIX 40 MG TBEC (PANTOPRAZOLE SODIUM) qd  #90 x 3   Entered by:   Gladis Riffle, RN   Authorized by:   Birdie Sons MD   Signed by:   Gladis Riffle, RN on 12/20/2009   Method used:   Faxed to ...       [facility] (mail-order)             , [place]         Ph: [phone]       Fax: [phone]   RxID:   [id number]

## 2010-06-13 NOTE — Consult Note (Signed)
Summary: [facility]  [facility]   Imported By: Maryln Gottron 10/22/2009 15:09:34  _____________________________________________________________________  External Attachment:    Type:   Image     Comment:   External Document

## 2010-06-13 NOTE — Assessment & Plan Note (Signed)
Summary: yearly/sl    Visit Type:  77 yr f/u Referring Provider:  [name] Primary Provider:  Birdie Sons, MD  CC:  chest pain..sob...edema/hands ..pt c/o bilateral leg recently while sleeping.  History of Present Illness: [name] comes in today for further management are nonobstructive coronary artery disease, hypertension, and mitral valve prolapse.  She has an occasional episode of chest discomfort relieved with one or 2 nitroglycerin. This is using at night. She also wears oxygen at night for O2 dependent COPD.  She's had a lot of problems with cold hands and feet and has a diagnosis of Raynaud's. We are managing this conservatively.  She's the process of selling her home. She is derived great benefit and pleasure by attending her yard in the past. She says she just does not have the energy to do that anymore.  Current Medications (verified): 1)  Aspirin 325 Mg Tabs (Aspirin) .... Once Daily 2)  Oxygen .... 2l At Bedtime 3)  Polyethylene Glycol 1000   Powd (Polyethylene Glycol 1000) .... 1/2 Dose Every Day 4)  Benadryl 25 Mg  Caps (Diphenhydramine Hcl) .... Take 1 Capsule By Mouth At Bedtime 5)  Diazepam 5 Mg Tabs (Diazepam) .... 1/2 Tablet By Mouth At Bedtime 6)  Nitroglycerin 0.4 Mg Subl (Nitroglycerin) .... One Tablet Under Tongue Every 5 Minutes As Needed For Chest Pain---May Repeat Times Three 7)  Afrin Nasal Spray 0.05 % Soln (Oxymetazoline Hcl) .... Prn 8)  Astelin 137 Mcg/spray Soln (Azelastine Hcl) .... Use 2 Sprays in Each Nostril Once Daily 9)  Losartan Potassium 100 Mg Tabs (Losartan Potassium) .... Take 1 Tablet By Mouth Once A Day  Allergies: 1)  ! * Z Pack 2)  Penicillin G Potassium (Penicillin G Potassium) 3)  Spiriva Handihaler (Tiotropium Bromide Monohydrate) 4)  Sulfamethoxazole (Sulfamethoxazole)  Review of Systems       negative other than history of present illness  Vital Signs:  Patient profile:   77 year old female Height:       61 inches Weight:      131 pounds Pulse rate:   71 / minute Pulse rhythm:   irregular BP sitting:   100 / 62  (left arm) Cuff size:   large  Vitals Entered By: Danielle Rankin, CMA (June 19, 2009 1:37 PM)  Physical Exam  General:  Well developed, well nourished, in no acute distress. Head:  normocephalic and atraumatic Eyes:  PERRLA/EOM intact; conjunctiva and lids normal. Neck:  Neck supple, no JVD. No masses, thyromegaly or abnormal cervical nodes. Chest Willis Holquin:  no deformities or breast masses noted Lungs:  Clear bilaterally to auscultation and percussion. Heart:  regular rate and rhythm, no click or significant murmur Msk:  Back normal, normal gait. Muscle strength and tone normal. Pulses:  pulses normal in all 4 extremities Extremities:  trace left pedal edema and trace right pedal edema.   Neurologic:  Alert and oriented x 3. Skin:  Intact without lesions or rashes. Psych:  Normal affect.   EKG  Procedure date:  06/19/2009  Findings:      normal sinus rhythm with a first-degree AV block, left atrial enlargement, no significant change since previous ECG  Impression & Recommendations:  Problem # 1:  CAD, UNSPECIFIED SITE (ICD-414.00) Assessment Unchanged  Her updated medication list for this problem includes:    Aspirin 325 Mg Tabs (Aspirin) ..... Once daily    Nitroglycerin 0.4 Mg Subl (Nitroglycerin) ..... One tablet under tongue  every 5 minutes as needed for chest pain---may repeat times three  Problem # 2:  MITRAL VALVE PROLAPSE, HX OF (ICD-V12.50) Assessment: Unchanged  Problem # 3:  HYPERTENSION (ICD-401.9) Assessment: Improved her pressures running a little bit low and she's complaining of some fatigue. We'll decrease her losartan 50 mg per day. Her updated medication list for this problem includes:    Aspirin 325 Mg Tabs (Aspirin) ..... Once daily    Losartan Potassium 100 Mg Tabs (Losartan potassium) .Marland Kitchen... Take 1 tablet by mouth once a day  Other  Orders: EKG w/ Interpretation (93000)  Patient Instructions: 1)  Your physician recommends that you schedule a follow-up appointment in: year with dr Teniqua Marron 2)  Your physician has recommended you make the following change in your medication: decrease losarten to 50 mg every day Prescriptions: NITROGLYCERIN 0.4 MG SUBL (NITROGLYCERIN) One tablet under tongue every 5 minutes as needed for chest pain---may repeat times three  #25 x 4   Entered by:   Scherrie Bateman, LPN   Authorized by:   Gaylord Shih, MD, [facility]   Signed by:   Scherrie Bateman, LPN on 11/91/4782   Method used:   Electronically to        [facility]. [phone]* (retail)       [phone]       [place], [place]  [address]       Ph: [phone]       Fax: [phone]   RxID:   [id number]

## 2010-06-13 NOTE — Letter (Signed)
Summary: [facility]   Imported By: Sherian Rein 10/10/2009 14:05:44  _____________________________________________________________________  External Attachment:    Type:   Image     Comment:   External Document

## 2010-06-13 NOTE — Assessment & Plan Note (Signed)
Summary: LEG PAIN/DLO   Vital Signs:  Patient profile:   77 year old female Height:      62 inches (157.48 cm) Weight:      135.50 pounds (61.59 kg) BMI:     24.87 O2 Sat:      96 % on Room air Temp:     98.5 degrees F (36.94 degrees C) oral Pulse rate:   70 / minute BP sitting:   130 / 86  (left arm) Cuff size:   regular  Vitals Entered By: Brenton Grills CMA Duncan Dull) (May 11, 2010 12:45 PM)  O2 Flow:  Room air CC: Bilateral leg pain x 3 days (sharp shooting pain at night)/aj Is Patient Diabetic? No Pain Assessment Patient in pain? yes     Location: thigh Onset of pain  Sudden   History of Present Illness: Pt here c/o leg pain,  L knee pain.   Pt has been to PT ---she saw Dr Cato Mulligan for same complaint and she was sent to PT.  Pt states pain no better.  She called on call nurse who insisted she come in today.    Pt states pain is now in both legs.  No known injury.    No CP, no calf pain.  Current Medications (verified): 1)  Aspirin 325 Mg Tabs (Aspirin) .... Once Daily 2)  Oxygen .... 2l  With Exertion and At Night, You May Take Oxygen Off At Rest 3)  Polyethylene Glycol 1000   Powd (Polyethylene Glycol 1000) .... 1/2 Dose Every Day 4)  Nitroglycerin 0.4 Mg Subl (Nitroglycerin) .... One Tablet Under Tongue Every 5 Minutes As Needed For Chest Pain---May Repeat Times Three 5)  Losartan Potassium 100 Mg Tabs (Losartan Potassium) .... Take 1 Tablet By Mouth Once A Day 6)  Protonix 40 Mg Tbec (Pantoprazole Sodium) .... Qd 7)  Spiriva Handihaler 18 Mcg Caps (Tiotropium Bromide Monohydrate) .... Once Daily 8)  Diazepam 5 Mg Tabs (Diazepam) .... Take 1 Tablet By Mouth At Bedtime As Needed 9)  Stool Softener 100 Mg Caps (Docusate Sodium) .... Take 1 Capsule By Mouth Once A Day 10)  Nasonex 50 Mcg/act Susp (Mometasone Furoate) .... 2 Sprays Each Nostril Once Daily 11)  Aleve 220 Mg Tabs (Naproxen Sodium) 12)  Ultram 50 Mg Tabs (Tramadol Hcl) .... 1/2 -1 By Mouth Every 6 Hours As  Needed  Allergies (verified): 1)  ! * Z Pack 2)  ! * Shellfish 3)  Penicillin G Potassium (Penicillin G Potassium) 4)  Spiriva Handihaler (Tiotropium Bromide Monohydrate) 5)  Sulfamethoxazole (Sulfamethoxazole)  Family History: Reviewed history from 04/07/2008 and no changes required. father had parkinsonism. Mother died in her 21s of old age. Cancer in the family  Family History of Colon Cancer:Aunt (diagnosed age 59) Family History of Uterine Cancer:Aunt   Social History: Reviewed history from 04/07/2008 and no changes required. lives alone widow Former Smoker Alcohol Use - yes Illicit Drug Use - no   Review of Systems      See HPI  Physical Exam  General:  Well-developed,well-nourished,in no acute distress; alert,appropriate and cooperative throughout examination Lungs:  Normal respiratory effort, chest expands symmetrically. Lungs are clear to auscultation, no crackles or wheezes. Heart:  normal rate and no murmur.   Msk:  normal ROM.   Extremities:  No clubbing, cyanosis, edema, or deformity noted with normal full range of motion of all joints.   no calf pain Neurologic:  alert & oriented X3, strength normal in all extremities, gait normal, and  DTRs symmetrical and normal.   Psych:  Oriented X3 and normally interactive.     Impression & Recommendations:  Problem # 1:  LEG PAIN, BILATERAL (ICD-729.5)  Pt will try aleve first --if no relief she will fill ultram  f/u pcp next week  Complete Medication List: 1)  Aspirin 325 Mg Tabs (Aspirin) .... Once daily 2)  Oxygen  .... 2l  with exertion and at night, you may take oxygen off at rest 3)  Polyethylene Glycol 1000 Powd (Polyethylene glycol 1000) .... 1/2 dose every day 4)  Nitroglycerin 0.4 Mg Subl (Nitroglycerin) .... One tablet under tongue every 5 minutes as needed for chest pain---may repeat times three 5)  Losartan Potassium 100 Mg Tabs (Losartan potassium) .... Take 1 tablet by mouth once a day 6)   Protonix 40 Mg Tbec (Pantoprazole sodium) .... Qd 7)  Spiriva Handihaler 18 Mcg Caps (Tiotropium bromide monohydrate) .... Once daily 8)  Diazepam 5 Mg Tabs (Diazepam) .... Take 1 tablet by mouth at bedtime as needed 9)  Stool Softener 100 Mg Caps (Docusate sodium) .... Take 1 capsule by mouth once a day 10)  Nasonex 50 Mcg/act Susp (Mometasone furoate) .... 2 sprays each nostril once daily 11)  Aleve 220 Mg Tabs (Naproxen sodium) 12)  Ultram 50 Mg Tabs (Tramadol hcl) .... 1/2 -1 by mouth every 6 hours as needed Prescriptions: ULTRAM 50 MG TABS (TRAMADOL HCL) 1/2 -1 by mouth every 6 hours as needed  #30 x 0   Entered and Authorized by:   Loreen Freud DO   Signed by:   Loreen Freud DO on 05/11/2010   Method used:   Print then Give to Patient   RxID:   [id number]    Orders Added: 1)  Est. Patient Level III [86578]

## 2010-06-13 NOTE — Progress Notes (Signed)
Summary: u/s report  Phone Note Call from Patient   Caller: Patient Call For: Birdie Sons MD Summary of Call: had abd u/s 1 week ago at [facility] ; hasn't heard anything, Dr Tommi Emery) said he was going to talk with Dr Cato Mulligan- waiting for report. Initial call taken by: Raechel Ache, RN,  July 02, 2009 8:20 AM

## 2010-06-13 NOTE — Assessment & Plan Note (Signed)
Summary: fu from pneumonia/njr   Vital Signs:  Patient profile:   77 year old female Weight:      131 pounds O2 Sat:      94 % on Room air Temp:     98.8 degrees F oral Pulse rate:   80 / minute Pulse rhythm:   regular Resp:     12 per minute BP sitting:   110 / 76  (left arm) Cuff size:   regular  Vitals Entered By: Gladis Riffle, RN (November 30, 2009 8:48 AM)  O2 Flow:  Room air CC: FU pneumonia, was in hospital x 6 days Is Patient Diabetic? No   Primary Care Provider:  Birdie Sons, MD  CC:  FU pneumonia and was in hospital x 6 days.  History of Present Illness: in hosp  6/27- 7/2   went to [place] first  had sore throat and first went to [place] and was in [facility]  and urgent care first rx zpak and hydromet and proair then got worse  (rx 6/25) GOT TO  point of altered mental status, and walking sideways.  went to [place] and was hosp  [place] and hosp ED and admitted rx levaquin and oxygen.   was d/c 11/10/09.   Stayed out [place] until 11/23/09.  Son flew back. did not used oxygen.  90% on oximetry on plane since home:  sats in 80%.  and HR up.  advair and spiriva added.    Now  symptoms:  chest stays tight, there is chest pain across the top.    no cough,  is dypsneic with exertion and is weak and tired. Not self.  slurrs speech now.   Notes pn drip.   [facility] ,  nasal cannula irritates. no f/c/s.    no edema.  eats ok   she is feeling much better reviewed above hx with pt---she agrees  All other systems reviewed and were negative   Preventive Screening-Counseling & Management  Alcohol-Tobacco     Smoking Status: quit > 6 months     Year Started: 1942     Year Quit: 1990     Pack years: 4  Current Problems (verified): 1)  Pneumonia, Community Acquired, Pneumococcal  (ICD-481) 2)  Raynaud's Syndrome  (ICD-443.0) 3)  Cad, Unspecified Site  (ICD-414.00) 4)  Mitral Valve Prolapse, Hx of  (ICD-V12.50) 5)  COPD  (ICD-496) 6)  Hypertension   (ICD-401.9) 7)  Gerd  (ICD-530.81) 8)  Cholelithiasis  (ICD-574.20) 9)  Diverticulosis-colon  (ICD-562.10) 10)  Osteoporosis  (ICD-733.00) 11)  Airway Obstruction  (ICD-519.8) 12)  Neoplasm, Malignant, Breast, Right  (ICD-174.9) 13)  Hypersomnia, Associated With Sleep Apnea  (ICD-780.53) 14)  Disease, 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 Once Daily 2)  Oxygen .... 2l  With Exertion and At Night, You May Take Oxygen Off At Rest 3)  Polyethylene Glycol 1000   Powd (Polyethylene Glycol 1000) .... 1/2 Dose Every Day 4)  Nitroglycerin 0.4 Mg Subl (Nitroglycerin) .... One Tablet Under Tongue Every 5 Minutes As Needed For Chest Pain---May Repeat Times Three 5)  Afrin Nasal Spray 0.05 % Soln (Oxymetazoline Hcl) .... At Bedtime As Needed 6)  Losartan Potassium 100 Mg Tabs (Losartan Potassium) .... Take 1 Tablet By Mouth Once A Day 7)  Protonix 40 Mg Tbec (Pantoprazole Sodium) .... Qd 8)  Spiriva Handihaler 18 Mcg Caps (Tiotropium Bromide Monohydrate) .... Once Daily 9)  Diazepam 5 Mg Tabs (Diazepam) .... Take 1 Tablet By Mouth At Bedtime As Needed  Allergies: 1)  ! * Z Pack 2)  ! * Shellfish 3)  Penicillin G Potassium (Penicillin G Potassium) 4)  Spiriva Handihaler (Tiotropium Bromide Monohydrate) 5)  Sulfamethoxazole (Sulfamethoxazole)  Past History:  Past Surgical History: Last updated: 04/07/2008 Hysterectomy age 28 Oophorectomy, bilateral SO age 36 prolapsed bladder S/P surgery granuloma, pulmonary-thoracotomy Breast Surgery: partial mastectomy R 07/21/07 for breast CA all margins neg and LNs neg Tonsillectomy Appendectomy Rotator Cuff Repair  Family History: Last updated: 04/07/2008 father had parkinsonism. Mother died in her 74s of old age. Cancer in the family  Family History of Colon Cancer:Aunt (diagnosed age 75) Family History of Uterine Cancer:Aunt   Social History: Last updated:  04/07/2008 lives alone widow Former Smoker Alcohol Use - yes Illicit Drug Use - no   Risk Factors: Smoking Status: quit > 6 months (11/30/2009)  Past Medical History: MITRAL VALVE PROLAPSE, HX OF (ICD-V12.50) ATRIAL FIBRILLATION (ICD-427.31) COPD (ICD-496)...  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   Head:  normocephalic and atraumatic.   Eyes:  pupils equal and pupils round.   Ears:  R ear normal and L ear normal.   Neck:  No deformities, masses, or tenderness noted. Chest Wall:  no deformities.   Lungs:  normal respiratory effort and no intercostal retractions.   Heart:  normal rate and regular rhythm.   Abdomen:  soft and non-tender.   Msk:  the left fifth finger was swollen and ecchymotic with a flexion deformity. Neurologic:  cranial nerves II-XII intact and gait normal.   Skin:  turgor normal and color normal.     Impression & Recommendations:  Problem # 1:  PNEUMONIA, COMMUNITY ACQUIRED, PNEUMOCOCCAL (ICD-481)  reviewed records from dr Delford Field and from [place] she is clearly much better she is using oxygen 24 hours per day she has completed antibiotics  Problem # 2:  COPD (ICD-496) clearly contributes to her risk of pneumonia Her updated medication list for this problem includes:    Spiriva Handihaler 18 Mcg Caps (Tiotropium bromide monohydrate) ..... Once daily  Complete  Medication List: 1)  Aspirin 325 Mg Tabs (Aspirin) .... Once daily 2)  Oxygen  .... 2l  with exertion and at night, you may take oxygen off at rest 3)  Polyethylene Glycol 1000 Powd (Polyethylene glycol 1000) .... 1/2 dose every day 4)  Nitroglycerin 0.4 Mg Subl (Nitroglycerin) .... One tablet under tongue every 5 minutes as needed for chest pain---may repeat times three 5)  Afrin Nasal Spray 0.05 % Soln (Oxymetazoline hcl) .... At bedtime as needed 6)  Losartan Potassium 100 Mg Tabs (Losartan potassium) .... Take 1 tablet by mouth once a day 7)  Protonix 40 Mg Tbec (Pantoprazole sodium) .... Qd 8)  Spiriva Handihaler 18 Mcg Caps (Tiotropium bromide monohydrate) .... Once daily 9)  Diazepam 5 Mg Tabs (Diazepam) .... Take 1 tablet by mouth at bedtime as needed  Patient Instructions: 1)  see me 6 weeks

## 2010-06-13 NOTE — Progress Notes (Signed)
Summary: prior auth for Pantoprazole approved for 1 year.  Phone Note Call from Patient Call back at Home Phone [phone]   Caller: Patient---triage vm Summary of Call: [phone] for pantoprazole. needs prior auth. on early refill. they have the rx. Initial call taken by: Warnell Forester,  March 12, 2010 10:19 AM  Follow-up for Phone Call        spoke with gina @ [employer]. Prior auth for Pantoprazole has been approved from 03-12-2010 through 03-13-2011. Follow-up by: Warnell Forester,  March 12, 2010 10:27 AM

## 2010-06-13 NOTE — Letter (Signed)
Summary: [facility]   Imported By: Lennie Odor 05/23/2010 17:13:15  _____________________________________________________________________  External Attachment:    Type:   Image     Comment:   External Document

## 2010-06-13 NOTE — Progress Notes (Signed)
Summary: talk to nurse  Phone Note Call from Patient Call back at Home Phone [phone]   Caller: Patient Call For: wright Reason for Call: Talk to Nurse Summary of Call: Pt has appt with PW at 4p today, wants to know if he wants her to come in for x-ray prior to her appt pls advise. Initial call taken by: Darletta Moll,  December 17, 2009 8:09 AM  Follow-up for Phone Call        I do not see on last office note that Dr. Delford Field wanted pt to have a cxr prior to seeing him. Told the pt to come for 4pm appt and if Dr. Delford Field decided to do cxr he would order it while she is here for OV. Follow-up by: Michel Bickers CMA,  December 17, 2009 9:43 AM

## 2010-06-13 NOTE — Medication Information (Signed)
Summary: Protonix Approved  Protonix Approved   Imported By: Maryln Gottron 03/15/2010 13:12:56  _____________________________________________________________________  External Attachment:    Type:   Image     Comment:   External Document

## 2010-06-13 NOTE — Progress Notes (Signed)
Summary: called pt and she is still having pain.  Will await referral.  ---- Converted from flag ---- ---- 07/12/2009 7:48 AM, Birdie Sons MD wrote: Call patient. if she is still having symptomatic gall bladder disease please refer to [facility] for surgical and anesthesia consultation (she has tracheal stenosis and needs anesthesia consultation. if she is feeling well then no need for referral  ---- 07/10/2009 1:34 PM, Storm Frisk MD wrote: ok  ---- 07/10/2009 7:05 AM, Birdie Sons MD wrote: Thanks, we'll call her  ---- 07/09/2009 8:32 PM, Storm Frisk MD wrote: Smitty Cords  this pt has progressive gallstone disease.  needs her GB out but [place] anesthesia wont put her to sleep due to upper airway obstruction  ?referral to [facility] or [facility] for GB surgery??????  pw ------------------------------ called pt and she continues to have pains,  she will await when and where of referral.  order in process. Gladis Riffle, RN  July 12, 2009 1:47 PM

## 2010-06-13 NOTE — Assessment & Plan Note (Signed)
Summary: 6 wk rov/njr pt rsc/njr   Vital Signs:  Patient profile:   77 year old female Weight:      134 pounds Temp:     98.2 degrees F oral Pulse rate:   68 / minute Resp:     12 per minute BP sitting:   128 / 74  Vitals Entered By: Lynann Beaver CMA (January 04, 2010 10:10 AM) CC: rov Is Patient Diabetic? No Pain Assessment Patient in pain? no        Primary Care Diahann Guajardo:  Birdie Sons, MD  CC:  rov.  History of Present Illness:  Follow-Up Visit      This is an 77 year old woman who presents for Follow-up visit.  The patient complains of SOB, but denies chest pain and palpitations.  Since the last visit the patient notes no new problems or concerns and being seen by a specialist---has seen dr wright--reviewed note.  The patient reports taking meds as prescribed.  When questioned about possible medication side effects, the patient notes none.    All other systems reviewed and were negative except she says that astelin doesn't work and that she would like to stop she also reports intermittent constipation---only if she misses glycolax  Current Problems (verified): 1)  Pneumonia, Community Acquired, Pneumococcal  (ICD-481) 2)  Raynaud's Syndrome  (ICD-443.0) 3)  Cad, Unspecified Site  (ICD-414.00) 4)  Mitral Valve Prolapse, Hx of  (ICD-V12.50) 5)  COPD  (ICD-496) 6)  Hypertension  (ICD-401.9) 7)  Gerd  (ICD-530.81) 8)  Cholelithiasis  (ICD-574.20) 9)  Diverticulosis-colon  (ICD-562.10) 10)  Osteoporosis  (ICD-733.00) 11)  Airway Obstruction  (ICD-519.8) 12)  Neoplasm, Malignant, Breast, Right  (ICD-174.9) 13)  Hypersomnia, Associated With Sleep Apnea  (ICD-780.53) 14)  Disease, 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 Once Daily 2)  Oxygen .... 2l  With Exertion and At Night, You May Take Oxygen Off At Rest 3)  Polyethylene Glycol 1000   Powd (Polyethylene Glycol 1000) .... 1/2 Dose Every  Day 4)  Nitroglycerin 0.4 Mg Subl (Nitroglycerin) .... One Tablet Under Tongue Every 5 Minutes As Needed For Chest Pain---May Repeat Times Three 5)  Afrin Nasal Spray 0.05 % Soln (Oxymetazoline Hcl) .... At Bedtime As Needed 6)  Losartan Potassium 100 Mg Tabs (Losartan Potassium) .... Take 1 Tablet By Mouth Once A Day 7)  Protonix 40 Mg Tbec (Pantoprazole Sodium) .... Qd 8)  Spiriva Handihaler 18 Mcg Caps (Tiotropium Bromide Monohydrate) .... Once Daily 9)  Diazepam 5 Mg Tabs (Diazepam) .... Take 1 Tablet By Mouth At Bedtime As Needed 10)  Astelin 137 Mcg/spray Soln (Azelastine Hcl) .... Two Sprays Each Nostril Daily As Needed At Bedtime  Allergies (verified): 1)  ! * Z Pack 2)  ! * Shellfish 3)  Penicillin G Potassium (Penicillin G Potassium) 4)  Spiriva Handihaler (Tiotropium Bromide Monohydrate) 5)  Sulfamethoxazole (Sulfamethoxazole)  Past History:  Past Medical History: Last updated: 11/30/2009 MITRAL VALVE PROLAPSE, HX OF (ICD-V12.50) ATRIAL FIBRILLATION (ICD-427.31) COPD (ICD-496)...  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 Mother died in her 80s of old age. Cancer in the family  Family History of Colon Cancer:Aunt (diagnosed age 42) Family History of Uterine Cancer:Aunt   Social History: Last updated: 04/07/2008 lives alone widow Former Smoker Alcohol Use - yes Illicit Drug Use - no   Risk Factors: Smoking Status: quit > 6 months (12/17/2009)  Physical Exam  General:  alert and well-developed.   Head:  normocephalic and atraumatic.   Eyes:  pupils equal and pupils round.   Ears:  R ear normal and L ear normal.   Neck:  No deformities, masses, or tenderness noted. Lungs:  normal respiratory effort and no intercostal retractions.   Heart:  normal rate and regular rhythm.   Abdomen:  soft and non-tender.   Msk:  No deformity or scoliosis noted of thoracic or lumbar spine.   Pulses:  R radial normal and L radial normal.   Extremities:  No clubbing, cyanosis, edema, or deformity noted (heberden's , bouchard's nodes) Neurologic:  cranial nerves II-XII intact and gait normal.   Skin:  turgor normal and color normal.   Cervical Nodes:  no anterior cervical adenopathy and no posterior cervical adenopathy.   Psych:  normally interactive and good eye contact.     Impression & Recommendations:  Problem # 1:  PNEUMONIA, COMMUNITY ACQUIRED, PNEUMOCOCCAL (ICD-481) clinically resolved she says her SOB is better than when she was sick she does note that she does better with oxygen with acitivity  Problem # 2:  HYPERTENSION (ICD-401.9) will need to follow Her updated medication list for this problem includes:    Losartan Potassium 100 Mg Tabs (Losartan potassium) .Marland Kitchen... Take 1 tablet by mouth once a day  BP today: 146/80 Prior BP: 126/78 (12/17/2009)  Labs Reviewed: K+: 4.6 (08/23/2009) Creat: : 0.8 (08/23/2009)   Chol: 201 (12/29/2007)   HDL: 52.5 (12/29/2007)   LDL: DEL (12/29/2007)   TG: 84 (12/29/2007)  Problem # 3:  COPD  (ICD-496) chronic, stable sxs she feels that she does better with oxygen Her updated medication list for this problem includes:    Spiriva Handihaler 18 Mcg Caps (Tiotropium bromide monohydrate) ..... Once daily  Problem # 4:  RAYNAUD'S SYNDROME (ICD-443.0)  no significant sxs  Problem # 5:  GERD (ICD-530.81) she is compliant with meds has rare break through discomfort---advised Tums.  Her updated medication list for this problem includes:    Protonix 40 Mg Tbec (Pantoprazole sodium) ..... Qd  Problem # 6:  CONSTIPATION (ICD-564.00) she should be complaint with miralax---she knows that it works  Problem # 7:  URINARY HESITANCY (ZOX-096.04)  she reports ongoing trouble with urinary hesitancy refer urology  Orders: Urology Referral (Urology)  Complete Medication List: 1)  Aspirin 325 Mg Tabs (Aspirin) .... Once daily 2)  Oxygen  .... 2l  with exertion and at night, you may take oxygen off at rest 3)  Polyethylene Glycol 1000 Powd (Polyethylene glycol 1000) .... 1/2 dose every day 4)  Nitroglycerin 0.4 Mg Subl (Nitroglycerin) .... One tablet under tongue every 5 minutes as needed for chest pain---may repeat times three 5)  Losartan Potassium 100 Mg Tabs (Losartan potassium) .... Take 1 tablet by mouth once a day 6)  Protonix 40 Mg Tbec (Pantoprazole sodium) .... Qd 7)  Spiriva Handihaler 18 Mcg  Caps (Tiotropium bromide monohydrate) .... Once daily 8)  Diazepam 5 Mg Tabs (Diazepam) .... Take 1 tablet by mouth at bedtime as needed  Patient Instructions: 1)  Please schedule a follow-up appointment in 2 months.

## 2010-06-13 NOTE — Assessment & Plan Note (Signed)
Summary: finger trauma/dm   Vital Signs:  Patient profile:   77 year old female Weight:      134 pounds Temp:     98.0 degrees F oral BP sitting:   130 / 70  (right arm) Cuff size:   regular  Vitals Entered By: Duard Brady LPN (Oct 01, 2009 12:54 PM) CC: c/o (L) little finger trauma - shut in car door this weekend   Is Patient Diabetic? No   Primary Care Provider:  Birdie Sons, MD  CC:  c/o (L) little finger trauma - shut in car door this weekend  .  History of Present Illness: 77 year old patient of who is seen today for follow-up.  yesterday she accidentally slammed a car door on her left fifth finger.  She has had persistent pain and ecchymoses and has been unable to extend the finger.  She has a history of COPD and hypertension, which have been stable  Allergies: 1)  ! * Z Pack 2)  ! * Shellfish 3)  Penicillin G Potassium (Penicillin G Potassium) 4)  Spiriva Handihaler (Tiotropium Bromide Monohydrate) 5)  Sulfamethoxazole (Sulfamethoxazole)  Physical Exam  General:  Well-developed,well-nourished,in no acute distress; alert,appropriate and cooperative throughout examination Msk:  the left fifth finger was swollen and ecchymotic with a flexion deformity.   Impression & Recommendations:  Problem # 1:  FRACTURE, FINGER (ICD-816.00)  Orders: Orthopedic Surgeon Referral (Ortho Surgeon)  Problem # 2:  COPD (ICD-496)  Problem # 3:  HYPERTENSION (ICD-401.9)  Her updated medication list for this problem includes:    Losartan Potassium 100 Mg Tabs (Losartan potassium) .Marland Kitchen... Take 1 tablet by mouth once a day  Complete Medication List: 1)  Aspirin 325 Mg Tabs (Aspirin) .... Once daily 2)  Oxygen  .... 2l at bedtime 3)  Polyethylene Glycol 1000 Powd (Polyethylene glycol 1000) .... 1/2 dose every day 4)  Benadryl 25 Mg Caps (Diphenhydramine hcl) .... Take 1 capsule by mouth at bedtime 5)  Nitroglycerin 0.4 Mg Subl (Nitroglycerin) .... One tablet under tongue every  5 minutes as needed for chest pain---may repeat times three 6)  Afrin Nasal Spray 0.05 % Soln (Oxymetazoline hcl) .... At bedtime as needed 7)  Losartan Potassium 100 Mg Tabs (Losartan potassium) .... Take 1 tablet by mouth once a day 8)  Protonix 40 Mg Tbec (Pantoprazole sodium) .... Qd  Patient Instructions: 1)  orthopedic evaluation today

## 2010-06-13 NOTE — Assessment & Plan Note (Signed)
Summary: Pulmonary OV   Copy to:  [name] Primary Ryland Tungate/Referring Honor Frison:  Birdie Sons, MD  CC:  3 wk follow up with CXR.  Pt states breathing has improved.  Hoarseness more noticable x couple weeks.  Off o2 during the day x 2 days -- doing fine.  Still coughing "a little."  Cough prod at times with yellow mucus..  History of Present Illness: Pulmonary OV:     This is an 77  yo patient with a history of COPD and mycobacterium avium intracellulare with  chronic granulomatous lung disease.  The pt also has hx of upper airway obstruction with difficult intubation with prior operative procedures. Also hx Breast CA s/p resection and XRT. Hx Gallstone disease with conservative Rx due to difficult intubation status.  Aug 31: Since last ov has done well, now ambulating without oxygen. Has a home pulse ox and does well with this. Pt denies any significant sore throat, nasal congestion or excess secretions, fever, chills, sweats, unintended weight loss, pleurtic or exertional chest pain, orthopnea PND, or leg swelling Pt denies any increase in rescue therapy over baseline, denies waking up needing it or having any early am or nocturnal exacerbations of coughing/wheezing/or dyspnea.  December 17, 2009 4:03 PM At last ov we Rec: Stop Advair Stay on Spiriva daily No further antibiotics Stay on oxygen 2 Liter at bedtime and with exertion,  Pt still dyspneic with exertion.  Too hot to get out.  Not very active.  Feels weak and not feels like doing anything. Wants to lie down in middle of the day.  Now sl cough.  Will get hoarse.     Preventive Screening-Counseling & Management  Alcohol-Tobacco     Smoking Status: quit > 6 months     Year Started: 1942     Year Quit: 1990     Pack years: 78  Current Medications (verified): 1)  Aspirin 325 Mg Tabs (Aspirin) .... Once Daily 2)  Oxygen .... 2l  With Exertion and At Night, You May Take Oxygen Off At Rest 3)  Polyethylene  Glycol 1000   Powd (Polyethylene Glycol 1000) .... 1/2 Dose Every Day 4)  Nitroglycerin 0.4 Mg Subl (Nitroglycerin) .... One Tablet Under Tongue Every 5 Minutes As Needed For Chest Pain---May Repeat Times Three 5)  Losartan Potassium 100 Mg Tabs (Losartan Potassium) .... Take 1 Tablet By Mouth Once A Day 6)  Protonix 40 Mg Tbec (Pantoprazole Sodium) .... Qd 7)  Spiriva Handihaler 18 Mcg Caps (Tiotropium Bromide Monohydrate) .... Once Daily 8)  Diazepam 5 Mg Tabs (Diazepam) .... Take 1 Tablet By Mouth At Bedtime As Needed 9)  Stool Softener 100 Mg Caps (Docusate Sodium) .... Take 1 Capsule By Mouth Once A Day  Allergies (verified): 1)  ! * Z Pack 2)  ! * Shellfish 3)  Penicillin G Potassium (Penicillin G Potassium) 4)  Spiriva Handihaler (Tiotropium Bromide Monohydrate) 5)  Sulfamethoxazole (Sulfamethoxazole)  Past History:  Past medical, surgical, family and social histories (including risk factors) reviewed, and no changes noted (except as noted below).  Past Medical History: Reviewed history from 11/30/2009 and no changes required. MITRAL VALVE PROLAPSE, HX OF (ICD-V12.50) ATRIAL FIBRILLATION (ICD-427.31) COPD (ICD-496)...  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 Hysterectomy age 29 Oophorectomy, bilateral SO age 68 prolapsed bladder S/P surgery granuloma,  pulmonary-thoracotomy Breast Surgery: partial mastectomy R 07/21/07 for breast CA all margins neg and LNs neg Tonsillectomy Appendectomy Rotator Cuff Repair  Family History: Reviewed history from 04/07/2008 and no changes required. father had parkinsonism. Mother died in her 21s of old age. Cancer in the family  Family History of Colon Cancer:Aunt (diagnosed age 51) Family History of Uterine Cancer:Aunt   Social History: Reviewed history from 04/07/2008 and no changes required. lives alone widow Former Smoker Alcohol Use - yes Illicit Drug Use - no   Review of Systems       The patient complains of shortness of breath with activity.  The patient denies shortness of breath at rest, productive cough, non-productive cough, coughing up blood, chest pain, irregular heartbeats, acid heartburn, indigestion, loss of appetite, weight change, abdominal pain, difficulty swallowing, sore throat, tooth/dental problems, headaches, nasal congestion/difficulty breathing through nose, sneezing, itching, ear ache, anxiety, depression, hand/feet swelling, joint stiffness or pain, rash, change in color of mucus, and fever.    Vital Signs:  Patient profile:   77 year old female Height:      62 inches Weight:      135.50 pounds BMI:     24.87 O2 Sat:      97 % on Room air Temp:     98.1 degrees F oral Pulse rate:   66 / minute BP sitting:   126 / 70  (left arm) Cuff size:   regular  Vitals Entered By: Gweneth Dimitri RN (January 09, 2010 2:34 PM)  O2 Flow:  Room air CC: 3 wk follow up with CXR.  Pt states breathing has improved.  Hoarseness more noticable x couple weeks.  Off o2 during the day x 2 days -- doing fine.  Still coughing "a little."  Cough prod at times with yellow mucus. Comments Medications reviewed with patient Daytime contact number verified with patient. Gweneth Dimitri RN  January 09, 2010 2:32 PM    Physical Exam  Additional Exam:  Gen: Pleasant, well-nourished, in no distress  , normal affect ENT: no lesions, no post nasal drip Neck: No JVD, no TMG, no carotid bruits Lungs: No use of accessory muscles, no dullness to percussion, distant BS,  Cardiovascular: RRR, heart sounds normal, no murmurs or gallops, no peripheral edema Abdomen: soft and non-tender, no HSM, BS normal Musculoskeletal: No deformities, no cyanosis or clubbing Neuro: alert, non-focal     CXR  Procedure date:  01/09/2010  Findings:      IMPRESSION: Postoperative changes in the left hemithorax, stable.  No acute findings.  Impression & Recommendations:  Problem # 1:  PNEUMONIA, COMMUNITY ACQUIRED, PNEUMOCOCCAL (ICD-481) Assessment Improved resolved pna on cxr plan  no further abx Orders: T-2 View CXR (71020TC)  Problem # 2:  COPD (ICD-496) Assessment: Improved copd stable plan No change in inhaled medications.   Maintain treatment program as currently prescribed. cont at bedtime oxygen d/c portable oxygen flu vaccine   Medications Added to Medication List This Visit: 1)  Stool Softener 100 Mg Caps (Docusate sodium) .... Take 1 capsule by mouth once a day  Complete Medication List: 1)  Aspirin 325 Mg Tabs (Aspirin) .... Once daily 2)  Oxygen  .... 2l  with exertion and at night, you may take oxygen  off at rest 3)  Polyethylene Glycol 1000 Powd (Polyethylene glycol 1000) .... 1/2 dose every day 4)  Nitroglycerin 0.4 Mg Subl (Nitroglycerin) .... One tablet under tongue every 5 minutes as needed for chest pain---may repeat times three 5)  Losartan Potassium 100 Mg Tabs (Losartan potassium) .... Take 1 tablet by mouth once a day 6)  Protonix 40 Mg Tbec (Pantoprazole sodium) .... Qd 7)  Spiriva Handihaler 18 Mcg Caps (Tiotropium bromide monohydrate) .... Once daily 8)  Diazepam 5 Mg Tabs (Diazepam) .... Take 1 tablet by mouth at bedtime as needed 9)  Stool Softener 100 Mg Caps (Docusate sodium) .... Take 1 capsule by mouth once a day  Other Orders: Est. Patient Level III  (14782) Influenza Vaccine MCR (95621) DME Referral (DME)  Patient Instructions: 1)  Stop portable oxygen 2)  Stay on nocturnal oxygen 3)  No other medication changes 4)  Return 3 months 5)  Flu vaccine today  Prevention & Chronic Care Immunizations   Influenza vaccine: Fluvax MCR  (01/09/2010)    Tetanus booster: 05/12/2005: Td    Pneumococcal vaccine: Pneumovax (Medicare)  (03/09/2009)    H. zoster vaccine: 04/14/2006: Zostavax  Colorectal Screening   Hemoccult: Not documented    Colonoscopy: Not documented  Other Screening   Pap smear: Not documented    Mammogram: Not documented    DXA bone density scan: Not documented   Smoking status: quit > 6 months  (01/09/2010)  Lipids   Total Cholesterol: 201  (12/29/2007)   LDL: DEL  (12/29/2007)   LDL Direct: 138.7  (12/29/2007)   HDL: 52.5  (12/29/2007)   Triglycerides: 84  (12/29/2007)  Hypertension   Last Blood Pressure: 126 / 70  (01/09/2010)   Serum creatinine: 0.8  (08/23/2009)   Serum potassium 4.6  (08/23/2009)  Self-Management Support :    Hypertension self-management support: Not documented   Nursing Instructions: Give Flu vaccine today    Immunizations Administered:  Influenza Vaccine # 1:    Vaccine Type: Fluvax MCR    Site: left deltoid    Mfr: Gl[employer]oSmith[employer]    Dose: 0.5 ml    Route: IM    Given by: Gweneth Dimitri RN    Exp. Date: 11/09/2010    Lot #: [id number]    VIS given: 12/03/06 version given January 09, 2010.  Flu Vaccine Consent Questions:    Do you have a history of severe allergic reactions to this vaccine? no    Any prior history of allergic reactions to egg and/or gelatin? no    Do you have a sensitivity to the preservative Thimersol? no    Do you have a past history of Guillan-Barre Syndrome? no    Do you currently have an acute febrile illness? no    Have you ever had a severe reaction to latex? no    Vaccine information given and explained to patient? yes    Are you  currently pregnant? no

## 2010-06-13 NOTE — Progress Notes (Signed)
Summary: leg pain, burning sensation of feet  Phone Note Call from Patient Call back at Home Phone [phone]   Caller: vm Call For: swords/ellen Reason for Call: Talk to Nurse, Talk to Doctor Summary of Call: Molar removal & gum work Fri.  Sleep sedation.  Also bad problems legs. Initial call taken by: Rudy Jew, RN,  May 28, 2009 9:29 AM  Follow-up for Phone Call        c/o bilateral leg pain that awakens her (not cramping).  Also when elevated feet from dependent position, they feel ice cold even with socks on.  At times has burning sensation of feet.  Has had x 2 months, but is worsening.  Also is to have molar extraction and gum surgery on Fri 1/21 and wants advise if should do now in light of above. Follow-up by: Gladis Riffle, RN,  May 28, 2009 10:28 AM  Additional Follow-up for Phone Call Additional follow up Details #1::        surgery should be fine schedule non-urgent visit for this complaint only.  Additional Follow-up by: Birdie Sons MD,  May 28, 2009 3:45 PM    Additional Follow-up for Phone Call Additional follow up Details #2::    Patient notified. Appt made for 1/31 Follow-up by: Gladis Riffle, RN,  May 28, 2009 3:57 PM

## 2010-06-13 NOTE — Assessment & Plan Note (Signed)
Summary: Pulmonary OV   Visit Type:  Follow-up Copy to:  [name] [name] Primary [name] [name]/Referring Aviv Lengacher:  Birdie Sons, MD  CC:  COPD follow-up...the patient says her breathing has improved on Spiriva...cough is worse and both prod and non-prod.  History of Present Illness: Pulmonary OV:     This is an 77  yo patient with a history of COPD and mycobacterium avium intracellulare with  chronic granulomatous lung disease.  The pt also has hx of upper airway obstruction with difficult intubation with prior operative procedures. Also hx Breast CA s/p resection and XRT. Hx Gallstone disease with conservative Rx due to difficult intubation status.  Aug 31: Since last ov has done well, now ambulating without oxygen. Has a home pulse ox and does well with this. Pt denies any significant sore throat, nasal congestion or excess secretions, fever, chills, sweats, unintended weight loss, pleurtic or exertional chest pain, orthopnea PND, or leg swelling Pt denies any increase in rescue therapy over baseline, denies waking up needing it or having any early am or nocturnal exacerbations of coughing/wheezing/or dyspnea.   March 15, 2010 12:10 PM  Not as good as before the pneumonia.  No energy.  Feels depressed, house is not selling. Notes cough and is dry and occ mucus and is yellowish.  Spiriva has helped sats stay ok during day, uses oxygen at night.  Has had two falls. December 17, 2009 4:03 PM At last ov we Rec: Stop Advair Stay on Spiriva daily No further antibiotics Stay on oxygen 2 Liter at bedtime and with exertion,  Pt still dyspneic with exertion.  Too hot to get out.  Not very active.  Feels weak and not feels like doing anything. Wants to lie down in middle of the day.  Now sl cough.  Will get hoarse.     Current Medications (verified): 1)  Aspirin 325 Mg Tabs (Aspirin) .... Once Daily 2)  Oxygen .... 2l  With Exertion and At Night, You May Take Oxygen Off At  Rest 3)  Polyethylene Glycol 1000   Powd (Polyethylene Glycol 1000) .... 1/2 Dose Every Day 4)  Nitroglycerin 0.4 Mg Subl (Nitroglycerin) .... One Tablet Under Tongue Every 5 Minutes As Needed For Chest Pain---May Repeat Times Three 5)  Losartan Potassium 100 Mg Tabs (Losartan Potassium) .... Take 1 Tablet By Mouth Once A Day 6)  Protonix 40 Mg Tbec (Pantoprazole Sodium) .... Qd 7)  Spiriva Handihaler 18 Mcg Caps (Tiotropium Bromide Monohydrate) .... Once Daily 8)  Diazepam 5 Mg Tabs (Diazepam) .... Take 1 Tablet By Mouth At Bedtime As Needed 9)  Stool Softener 100 Mg Caps (Docusate Sodium) .... Take 1 Capsule By Mouth Once A Day  Allergies (verified): 1)  ! * Z Pack 2)  ! * Shellfish 3)  Penicillin G Potassium (Penicillin G Potassium) 4)  Spiriva Handihaler (Tiotropium Bromide Monohydrate) 5)  Sulfamethoxazole (Sulfamethoxazole)  Past History:  Past medical, surgical, family and social histories (including risk factors) reviewed, and no changes noted (except as noted below).  Past Medical History: Reviewed history from 03/01/2010 and no changes required. MITRAL VALVE PROLAPSE, HX OF (ICD-V12.50) ATRIAL FIBRILLATION (ICD-427.31) COPD (ICD-496)...  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 Hysterectomy age 43 Oophorectomy, bilateral SO age 28 prolapsed bladder S/P surgery granuloma, pulmonary-thoracotomy Breast Surgery: partial mastectomy R 07/21/07 for breast CA all margins neg and LNs neg Tonsillectomy Appendectomy Rotator Cuff  Repair  Family History: Reviewed history from 04/07/2008 and no changes required. father had parkinsonism. Mother died in her 104s of old age. Cancer in the family  Family History of Colon Cancer:Aunt (diagnosed age 55) Family History of Uterine Cancer:Aunt   Social History: Reviewed history from 04/07/2008 and no changes required. lives alone widow Former Smoker Alcohol Use - yes Illicit Drug Use - no   Vital Signs:  Patient profile:   77 year old female Height:      62 inches (157.48 cm) Weight:      137 pounds (62.27 kg) BMI:     25.15 O2 Sat:      96 % on Room air Temp:     97.8 degrees F (36.56 degrees C) oral Pulse rate:   69 / minute BP sitting:   118 / 72  (left arm) Cuff size:   regular  Vitals Entered By: Michel Bickers CMA (March 15, 2010 12:03 PM)  O2 Sat at Rest %:  96 O2 Flow:  Room air  Clinical Reports Reviewed:  PFT's:  12/05/2009: FEF 25/75 %Predicted:  99 FEV1 %Predicted:  108 FEV1/FVC %Predicted:  122 FVC %Predicted:  84  09/21/2009: FEV1 %Predicted:  71.80 FEV1/FVC %Predicted:  109.80 FVC %Predicted:  65.80  12/31/2008: FEF 25/75 %Predicted:  90 FEV1 %Predicted:  108 FVC %Predicted:  86  CC: COPD follow-up...the patient says her breathing has improved on Spiriva...cough is worse and both prod and non-prod Comments Medications reviewed with patient Michel Bickers CMA  March 15, 2010 12:04 PM   Physical Exam  Additional Exam:  Gen: Pleasant, well-nourished, in no distress , normal affect ENT: no lesions, no post nasal drip Neck: No JVD, no TMG, no carotid bruits Lungs: No use of accessory muscles, no dullness to percussion, distant BS,  Cardiovascular: RRR, heart sounds normal, no murmurs or gallops, no peripheral edema Abdomen: soft and non-tender, no HSM, BS normal Musculoskeletal: No deformities, no cyanosis or clubbing Neuro: alert, non-focal     Impression & Recommendations:  Problem # 1:  COPD (ICD-496) Assessment  Unchanged  tracheobronchitis flare plan avelox x 5days  cont inhalerd med s   Medications Added to Medication List This Visit: 1)  Avelox 400 Mg Tabs (Moxifloxacin hcl) .... By mouth daily  Complete Medication List: 1)  Aspirin 325 Mg Tabs (Aspirin) .... Once daily 2)  Oxygen  .... 2l  with exertion and at night, you may take oxygen off at rest 3)  Polyethylene Glycol 1000 Powd (Polyethylene glycol 1000) .... 1/2 dose every day 4)  Nitroglycerin 0.4 Mg Subl (Nitroglycerin) .... One tablet under tongue every 5 minutes as needed for chest pain---may repeat times three 5)  Losartan Potassium 100 Mg Tabs (Losartan potassium) .... Take 1 tablet by mouth once a day 6)  Protonix 40 Mg Tbec (Pantoprazole sodium) .... Qd 7)  Spiriva Handihaler 18 Mcg Caps (Tiotropium bromide monohydrate) .... Once daily 8)  Diazepam 5 Mg Tabs (Diazepam) .... Take 1 tablet by mouth at bedtime as needed 9)  Stool Softener 100 Mg Caps (Docusate sodium) .... Take 1 capsule by mouth once a day 10)  Avelox 400 Mg Tabs (Moxifloxacin hcl) .... By mouth daily  Other Orders: Est. Patient Level IV (16109)  Patient Instructions:  1)  Avelox one daily for 5days (2 samples, 3 sent to pharmacy) 2)  No change in spiriva 3)  Return two months Prescriptions: AVELOX 400 MG  TABS (MOXIFLOXACIN HCL) By mouth daily  #3 x 0   Entered and Authorized by:   Storm Frisk MD   Signed by:   Storm Frisk MD on 03/15/2010   Method used:   Electronically to        [facility]. [phone]* (retail)       [phone]       [place], [place]  [address]       Ph: [phone]       Fax: [phone]   RxID:   [id number]   Prevention & Chronic Care Immunizations   Influenza vaccine: Fluvax MCR  (01/09/2010)    Tetanus booster: 05/12/2005: Td    Pneumococcal vaccine: Pneumovax (Medicare)  (03/09/2009)    H. zoster vaccine: 04/14/2006: Zostavax  Colorectal Screening   Hemoccult:  Not documented    Colonoscopy: Not documented  Other Screening   Pap smear: Not documented    Mammogram: Not documented    DXA bone density scan: Not documented   Smoking status: quit > 6 months  (01/09/2010)  Lipids   Total Cholesterol: 201  (12/29/2007)   LDL: DEL  (12/29/2007)   LDL Direct: 138.7  (12/29/2007)   HDL: 52.5  (12/29/2007)   Triglycerides: 84  (12/29/2007)  Hypertension   Last Blood Pressure: 118 / 72  (03/15/2010)   Serum creatinine: 0.8  (08/23/2009)   Serum potassium 4.6  (08/23/2009)  Self-Management Support :    Hypertension self-management support: Not documented

## 2010-06-13 NOTE — Letter (Signed)
Summary: Letter for [employer]  Letter for [employer]   Imported By: Sherian Rein 10/25/2009 08:47:37  _____________________________________________________________________  External Attachment:    Type:   Image     Comment:   External Document

## 2010-06-13 NOTE — Progress Notes (Signed)
Summary: Call-A-Nurse Report    Call-A-Nurse Triage Call Report Triage Record Num: [id number] Operator: Audelia Hives Patient Name: [name] [name] Call Date & Time: 05/11/2010 11:15:37AM Patient Phone: [phone] PCP: Valetta Mole. Demarrius Guerrero Patient Gender: Female PCP Fax : [phone] Patient DOB: 1923-06-04 [facility] Name: [facility] Reason for Call: [facility] calling regarding a fall that happened in sept 2011, went to rehab x 1 month. Had balance test in rehab.Having pain to both legs that started 05/08/10 and the pain is sever that is has woke her up from her sleep. Has a burnig sensation in her legs. No swelling and pt able to walk, does not want to go to the ED. Emergent s/s for Leg-Non Injury r/o per protocol except for see in 24 hours. Transferred pt to [facility] office and appt sch for 12:00 pm and appt made by Lupita Leash. Protocol(s) Used: Leg Non-Injury Recommended Outcome per Protocol: See Provider within 24 hours Reason for Outcome: New onset mild to moderate pain that has not improved with 24 hours of home care Care Advice:  ~ 05/11/2010 11:34:04AM Page 1 of 1 [email or web address]

## 2010-06-13 NOTE — Miscellaneous (Signed)
Summary: Initial Summary for PT Services/[facility]  Initial Summary for PT Services/[facility]   Imported By: Maryln Gottron 03/22/2010 13:21:57  _____________________________________________________________________  External Attachment:    Type:   Image     Comment:   External Document

## 2010-06-13 NOTE — Assessment & Plan Note (Signed)
Summary: 2 month rov/njr   Vital Signs:  Patient profile:   77 year old female Weight:      136 pounds Temp:     98.2 degrees F oral Pulse rhythm:   regular Resp:     12 per minute BP sitting:   142 / 80  Vitals Entered By: Lynann Beaver CMA AAMA (March 01, 2010 11:11 AM) CC: rov Pain Assessment Patient in pain? no        Primary Care Provider:  Birdie Sons, MD  CC:  rov.  History of Present Illness: she reports two falls in the past two weeks. she can't recall what made her fall. However, after questioning her she is able to describe each fall in exauisite detail She had been relatively inactive this summer she states that one of the falls was related to clumsiness related to working on a limb.  second fall was in garage when she was trying to catch "a bug". Had a large echymosis on right hip. Slight headache initially where she hit head...resolved.   She also complains of left knee pain---ongoing for decades---some worse after the fall.   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 Once Daily 2)  Oxygen .... 2l  With Exertion and At Night, You May Take Oxygen Off At Rest 3)  Polyethylene Glycol 1000   Powd (Polyethylene Glycol 1000) .... 1/2 Dose Every Day 4)  Nitroglycerin 0.4 Mg Subl (Nitroglycerin) .... One Tablet Under Tongue Every 5 Minutes As  Needed For Chest Pain---May Repeat Times Three 5)  Losartan Potassium 100 Mg Tabs (Losartan Potassium) .... Take 1 Tablet By Mouth Once A Day 6)  Protonix 40 Mg Tbec (Pantoprazole Sodium) .... Qd 7)  Spiriva Handihaler 18 Mcg Caps (Tiotropium Bromide Monohydrate) .... Once Daily 8)  Diazepam 5 Mg Tabs (Diazepam) .... Take 1 Tablet By Mouth At Bedtime As Needed 9)  Stool Softener 100 Mg Caps (Docusate Sodium) .... Take 1 Capsule By Mouth Once A Day  Allergies (verified): 1)  ! * Z Pack 2)  ! * Shellfish 3)  Penicillin G Potassium (Penicillin G Potassium) 4)  Spiriva Handihaler (Tiotropium Bromide Monohydrate) 5)  Sulfamethoxazole (Sulfamethoxazole)  Past History:  Past Surgical History: Last updated: 04/07/2008 Hysterectomy age 45 Oophorectomy, bilateral SO age 47 prolapsed bladder S/P surgery granuloma, pulmonary-thoracotomy Breast Surgery: partial mastectomy R 07/21/07 for breast CA all margins neg and LNs neg Tonsillectomy Appendectomy Rotator Cuff Repair  Family History: Last updated: 04/07/2008 father had parkinsonism. Mother died in her 93s of old age. Cancer in the family  Family History of Colon Cancer:Aunt (diagnosed age 24) Family History of Uterine Cancer:Aunt   Social History: Last updated: 04/07/2008 lives alone widow Former Smoker Alcohol Use - yes Illicit Drug Use - no   Risk Factors: Smoking Status: quit > 6 months (01/09/2010)  Past Medical History: MITRAL VALVE PROLAPSE, HX OF (ICD-V12.50) ATRIAL FIBRILLATION (ICD-427.31) COPD (ICD-496)...  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 father had parkinsonism. Mother died in her 102s of old age. Cancer in the family  Family History of Colon Cancer:Aunt (diagnosed age 86) Family History of Uterine Cancer:Aunt   Social History: Reviewed history from 04/07/2008 and no changes required. lives alone widow Former Smoker Alcohol Use - yes Illicit Drug Use - no   Physical Exam  General:  alert and well-developed.   Head:  normocephalic and atraumatic.   Eyes:  pupils equal and pupils round.   Ears:  R ear normal and L ear normal.   Neck:  No deformities, masses, or tenderness noted. Lungs:  normal respiratory effort and no intercostal retractions.   Heart:  normal rate and regular rhythm.   Abdomen:  soft and non-tender.   Skin:  turgor normal and color normal.  echymosis right hip   Impression & Recommendations:  Problem # 1:  GAIT DISTURBANCE (ICD-781.2)  discussed falls she is convinced that her gait is "off" I will refer to PT for eval and treat  Orders: Physical Therapy Referral (PT)  Problem # 2:  COPD (ICD-496) she has f/u with Dr. Delford Field Her updated medication list for this problem includes:    Spiriva Handihaler 18 Mcg Caps (Tiotropium bromide monohydrate) ..... Once daily  Pulmonary Functions Reviewed: FEV1: 1.61 (12/05/2009)   FEV 25-75: 1.73 (12/05/2009)   O2 sat: 97 (01/09/2010)     Vaccines Reviewed: Pneumovax: Pneumovax (Medicare) (03/09/2009)   Flu Vax: Fluvax MCR (01/09/2010)  Problem # 3:  ANXIETY (ICD-300.00) she admits to a great deal of stress and discusses all of her stressors in great detail she discussed family issues, car trouble, home trouble, etc Her updated medication list for this problem includes:    Diazepam 5 Mg Tabs (Diazepam) .Marland Kitchen... Take 1 tablet by mouth at bedtime as needed  Problem # 4:  GERD (ICD-530.81) no sxs continue current medications  Her updated medication list for  this problem includes:    Protonix 40 Mg Tbec (Pantoprazole sodium) ..... Qd  EGD: Location: [facility]   (05/19/2008)  Labs Reviewed: Hgb: 14.8 (08/23/2009)   Hct: 42.9 (08/23/2009) she has seen dr Terri Piedra recently for dermatologic eval  Complete Medication List: 1)  Aspirin 325 Mg Tabs (Aspirin) .... Once daily 2)  Oxygen  .... 2l  with exertion and at night, you may take oxygen off at rest 3)  Polyethylene Glycol 1000 Powd (Polyethylene glycol 1000) .... 1/2 dose every day 4)  Nitroglycerin 0.4 Mg Subl (Nitroglycerin) .... One tablet under tongue every 5 minutes as needed for chest pain---may repeat times three 5)  Losartan Potassium 100 Mg Tabs (Losartan potassium) .... Take 1 tablet by mouth once a day 6)  Protonix 40 Mg Tbec (Pantoprazole sodium) .... Qd 7)  Spiriva Handihaler 18 Mcg Caps (Tiotropium bromide monohydrate) .... Once daily 8)  Diazepam 5 Mg Tabs (Diazepam) .... Take 1 tablet by mouth at bedtime as needed 9)  Stool Softener 100 Mg Caps (Docusate sodium) .... Take 1 capsule by mouth once a day   Orders Added: 1)  Est. Patient Level IV [04540] 2)  Physical Therapy Referral [PT]

## 2010-06-13 NOTE — Progress Notes (Signed)
Summary: refill  Phone Note Call from Patient Call back at Home Phone [phone]   Caller: Patient Call For: [name] [name] Reason for Call: Refill Medication Summary of Call: Patient needs refill for spiriva--[facility]--3 month supply Initial call taken by: Lehman Prom,  April 25, 2010 11:07 AM  Follow-up for Phone Call        Called and verified with pt on Spiriva and 90 day supply to go to [facility]. Zackery Barefoot CMA  April 25, 2010 11:46 AM     Prescriptions: SPIRIVA HANDIHALER 18 MCG CAPS (TIOTROPIUM BROMIDE MONOHYDRATE) once daily  #90 x 3   Entered by:   Zackery Barefoot CMA   Authorized by:   Storm Frisk MD   Signed by:   Zackery Barefoot CMA on 04/25/2010   Method used:   Faxed to ...       [facility] (mail-order)             , [place]         Ph: [phone]       Fax: [phone]   RxID:   [id number]

## 2010-06-13 NOTE — Assessment & Plan Note (Signed)
Summary: Pulmonary OV   Copy to:  [name] Primary Shriley Joffe/Referring Mikah Poss:  Birdie Sons, MD  CC:  3 wk follow up.  Pt states breathing is the same and still having SOB with activity but states this is no better or worse.  Occ nonprod cough..  History of Present Illness: Pulmonary OV:     This is an 77  yo patient with a history of COPD and mycobacterium avium intracellulare with  chronic granulomatous lung disease.  The pt also has hx of upper airway obstruction with difficult intubation with prior operative procedures. Also hx Breast CA s/p resection and XRT. Hx Gallstone disease with conservative Rx due to difficult intubation status.  November 29, 2009 11:24 AM The pt was in hosp  6/27- 7/2  in [place].  Pt with prior hx of trip to [place]. The pt went to [place] first  had sore throat and first went to [place] and was in [facility]  and urgent care  where the pt was first rx with zpak and hydromet and proair.  She then travelled by car to [facility], (her son lives in [place]).  Pt  then got worse and went to ED whereupon sats noted 67%.   CXR with RUL infilt and LLL Infiltr. All c/s was neg.  Pt rx with levaquin IV.  She gradually improved and was d/c to home. The pt had gotten to the point of altered mental status, and walking sideways.  ED and admitted rx levaquin and oxygen.   was d/c 11/10/09.   Stayed out [place] until 11/23/09.  Son flew back with the pt.  She did not require oxygen therapy though it was set up as such.    Since home sats ok on home oximeter.  Pt had   advair and spiriva added upon d/c from [place] hosp.  Now  symptoms:  chest stays tight, there is chest pain across the top.    no cough,  is dypsneic with exertion and is weak and tired. Not self.  slurrs speech now.   Notes pn drip.   The pt notes nasal cannula irritates her nose. no f/c/s.    no edema.  Appt is ok December 17, 2009 4:03 PM At last ov we Rec: Stop Advair Stay on Spiriva daily No  further antibiotics Stay on oxygen 2 Liter at bedtime and with exertion,  Pt still dyspneic with exertion.  Too hot to get out.  Not very active.  Feels weak and not feels like doing anything. Wants to lie down in middle of the day.  Now sl cough.  Will get hoarse.   December 17, 2009 4:03 PM At last ov we Rec: Stop Advair Stay on Spiriva daily No further antibiotics Stay on oxygen 2 Liter at bedtime and with exertion,  Pt still dyspneic with exertion.  Too hot to get out.  Not very active.  Feels weak and not feels like doing anything. Wants to lie down in middle of the day.  Now sl cough.  Will get hoarse.     Preventive Screening-Counseling & Management  Alcohol-Tobacco     Smoking Status: quit > 6 months     Year Started: 1942     Year Quit: 1990     Pack years: 8  Current Medications (verified): 1)  Aspirin 325 Mg Tabs (Aspirin) .... Once Daily 2)  Oxygen .... 2l  With Exertion and At Night, You May Take  Oxygen Off At Rest 3)  Polyethylene Glycol 1000   Powd (Polyethylene Glycol 1000) .... 1/2 Dose Every Day 4)  Nitroglycerin 0.4 Mg Subl (Nitroglycerin) .... One Tablet Under Tongue Every 5 Minutes As Needed For Chest Pain---May Repeat Times Three 5)  Afrin Nasal Spray 0.05 % Soln (Oxymetazoline Hcl) .... At Bedtime As Needed 6)  Losartan Potassium 100 Mg Tabs (Losartan Potassium) .... Take 1 Tablet By Mouth Once A Day 7)  Protonix 40 Mg Tbec (Pantoprazole Sodium) .... Qd 8)  Spiriva Handihaler 18 Mcg Caps (Tiotropium Bromide Monohydrate) .... Once Daily 9)  Diazepam 5 Mg Tabs (Diazepam) .... Take 1 Tablet By Mouth At Bedtime As Needed  Allergies (verified): 1)  ! * Z Pack 2)  ! * Shellfish 3)  Penicillin G Potassium (Penicillin G Potassium) 4)  Spiriva Handihaler (Tiotropium Bromide Monohydrate) 5)  Sulfamethoxazole (Sulfamethoxazole)  Past History:  Past medical, surgical, family and social histories (including risk factors) reviewed, and no changes noted (except as noted  below).  Past Medical History: Reviewed history from 11/30/2009 and no changes required. MITRAL VALVE PROLAPSE, HX OF (ICD-V12.50) ATRIAL FIBRILLATION (ICD-427.31) COPD (ICD-496)...  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 Hysterectomy age 66 Oophorectomy, bilateral SO age 41 prolapsed bladder S/P surgery granuloma, pulmonary-thoracotomy Breast Surgery: partial mastectomy R 07/21/07 for breast CA all margins neg and LNs neg Tonsillectomy Appendectomy Rotator Cuff Repair  Family History: Reviewed history from 04/07/2008 and no changes required. father had parkinsonism. Mother died in her 23s of old age. Cancer in the family  Family History of Colon Cancer:Aunt (diagnosed age 25) Family History of Uterine Cancer:Aunt   Social History: Reviewed history from 04/07/2008 and no changes required. lives alone widow Former Smoker Alcohol Use - yes Illicit Drug Use - no   Review of Systems       The patient complains of shortness of breath with activity, shortness of breath at rest, productive cough, and non-productive cough.  The patient denies coughing up blood, chest pain, irregular heartbeats, acid heartburn, indigestion, loss of appetite, weight change, abdominal pain, difficulty swallowing, sore throat, tooth/dental problems, headaches, nasal congestion/difficulty  breathing through nose, sneezing, itching, ear ache, anxiety, depression, hand/feet swelling, joint stiffness or pain, rash, change in color of mucus, and fever.    Vital Signs:  Patient profile:   77 year old female Height:      62 inches Weight:      134.25 pounds BMI:     24.64 O2 Sat:      93 % on Room air Temp:     98.8 degrees F rectal Pulse rate:   83 / minute BP sitting:   126 / 78  (right arm) Cuff size:   regular  Vitals Entered By: Gweneth Dimitri RN (December 17, 2009 3:54 PM)  O2 Flow:  Room air CC: 3 wk follow up.  Pt states breathing is the same, still having SOB with activity but states this is no better or worse.  Occ nonprod cough. Comments Medications reviewed with patient Daytime contact number verified with patient. Gweneth Dimitri RN  December 17, 2009 3:56  PM    Physical Exam  Additional Exam:  Gen: Pleasant, well-nourished, in no distress , normal affect ENT: no lesions, no post nasal drip Neck: No JVD, no TMG, no carotid bruits Lungs: No use of accessory muscles, no dullness to percussion, distant BS,  focal wheeze in LLL Cardiovascular: RRR, heart sounds normal, no murmurs or gallops, no peripheral edema Abdomen: soft and non-tender, no HSM, BS normal Musculoskeletal: No deformities, no cyanosis or clubbing Neuro: alert, non-focal     Impression & Recommendations:  Problem # 1:  PNEUMONIA, COMMUNITY ACQUIRED, PNEUMOCOCCAL (ICD-481) Assessment Unchanged probable organized PNA organism unspecified plan avelox x 7days repulse prednisone Her updated medication list for this problem includes:    Avelox 400 Mg Tabs (Moxifloxacin hcl) ..... One by mouth daily  Orders: Est. Patient Level IV (40981)  Medications Added to Medication List This Visit: 1)  Astelin 137 Mcg/spray Soln (Azelastine hcl) .... Two sprays each nostril daily as needed at bedtime 2)  Avelox 400 Mg Tabs (Moxifloxacin hcl) .... One by mouth daily 3)  Prednisone 10 Mg Tabs (Prednisone)  .... Take as directed take 4 daily for two days, then 3 daily for two days, then two daily for two days then one daily for two days then stop  Complete Medication List: 1)  Aspirin 325 Mg Tabs (Aspirin) .... Once daily 2)  Oxygen  .... 2l  with exertion and at night, you may take oxygen off at rest 3)  Polyethylene Glycol 1000 Powd (Polyethylene glycol 1000) .... 1/2 dose every day 4)  Nitroglycerin 0.4 Mg Subl (Nitroglycerin) .... One tablet under tongue every 5 minutes as needed for chest pain---may repeat times three 5)  Afrin Nasal Spray 0.05 % Soln (Oxymetazoline hcl) .... At bedtime as needed 6)  Losartan Potassium 100 Mg Tabs (Losartan potassium) .... Take 1 tablet by mouth once a day 7)  Protonix 40 Mg Tbec (Pantoprazole sodium) .... Qd 8)  Spiriva Handihaler 18 Mcg Caps (Tiotropium bromide monohydrate) .... Once daily 9)  Diazepam 5 Mg Tabs (Diazepam) .... Take 1 tablet by mouth at bedtime as needed 10)  Astelin 137 Mcg/spray Soln (Azelastine hcl) .... Two sprays each nostril daily as needed at bedtime 11)  Avelox 400 Mg Tabs (Moxifloxacin hcl) .... One by mouth daily 12)  Prednisone 10 Mg Tabs (Prednisone) .... Take as directed take 4 daily for two days, then 3 daily for two days, then two daily for two days then one daily for two days then stop  Patient Instructions: 1)  Take avelox one daily for 5 days 2)  Prednisone 10mg  Take 4 daily for two days, then 3 daily for two days, then two daily for two days then one daily for two days then stop 3)  No other medication changes 4)  Return in 3 weeks with Chest Xray Prescriptions: PREDNISONE 10 MG  TABS (PREDNISONE) Take as directed Take 4 daily for two days, then 3 daily for two days, then two daily for two days then one daily for two days then stop  #20 x 0   Entered and Authorized by:   Storm Frisk MD   Signed by:   Storm Frisk MD on 12/17/2009   Method used:   Electronically to        [facility]. [phone]*  (retail)       [phone] [address]       [place], [place]  [address]  Ph: [phone]       Fax: [phone]   RxID:   [id number] AVELOX 400 MG TABS (MOXIFLOXACIN HCL) One by mouth daily  #3 x 0   Entered and Authorized by:   Storm Frisk MD   Signed by:   Storm Frisk MD on 12/17/2009   Method used:   Electronically to        [facility]. [phone]* (retail)       [phone]       [place], [place]  [address]       Ph: [phone]       Fax: [phone]   RxID:   [id number]

## 2010-06-13 NOTE — Assessment & Plan Note (Signed)
Summary: Pulmonary OV   Copy to:  [name] Primary Provider/Referring Provider:  Birdie Sons, MD  CC:  2 month follow up. Pt states breathing is slightly worse when doing activities and c/o faitgue.  Marland Kitchen  History of Present Illness: Pulmonary OV:     This is an 77  yo patient with a history of COPD and mycobacterium avium intracellulare with  chronic granulomatous lung disease.  The pt also has hx of upper airway obstruction with difficult intubation with prior operative procedures. Also hx Breast CA s/p resection and XRT. Hx Gallstone disease with conservative Rx due to difficult intubation status.  Aug 31: Since last ov has done well, now ambulating without oxygen. Has a home pulse ox and does well with this. Pt denies any significant sore throat, nasal congestion or excess secretions, fever, chills, sweats, unintended weight loss, pleurtic or exertional chest pain, orthopnea PND, or leg swelling Pt denies any increase in rescue therapy over baseline, denies waking up needing it or having any early am or nocturnal exacerbations of coughing/wheezing/or dyspnea.   March 15, 2010 12:10 PM  Not as good as before the pneumonia.  No energy.  Feels depressed, house is not selling. Notes cough and is dry and occ mucus and is yellowish.  Spiriva has helped sats stay ok during day, uses oxygen at night.  Has had two falls. Pt R knee is stiff and hurts, pt desires ortho referral for non surgical approach,  PT did not helpJanuary  4, 2012 2:22 PM Had two falls since last OV.  Knee hurts due to this on L cannot walk properly and has to use legs differently dyspnea comes and goes. December 17, 2009 4:03 PM At last ov we Rec: Stop Advair Stay on Spiriva daily No further antibiotics Stay on oxygen 2 Liter at bedtime and with exertion,  Pt still dyspneic with exertion.  Too hot to get out.  Not very active.  Feels weak and not feels like doing anything. Wants to lie down in  middle of the day.  Now sl cough.  Will get hoarse.     Current Medications (verified): 1)  Aspirin 325 Mg Tabs (Aspirin) .... Once Daily 2)  Oxygen .... 2l At Night, You May Take Oxygen Off At Rest 3)  Polyethylene Glycol 1000   Powd (Polyethylene Glycol 1000) .... 1/2 Dose Every Day 4)  Nitroglycerin 0.4 Mg Subl (Nitroglycerin) .... One Tablet Under Tongue Every 5 Minutes As Needed For Chest Pain---May Repeat Times Three 5)  Losartan Potassium 100 Mg Tabs (Losartan Potassium) .... Take 1 Tablet By Mouth Once A Day 6)  Protonix 40 Mg Tbec (Pantoprazole Sodium) .... Qd 7)  Spiriva Handihaler 18 Mcg Caps (Tiotropium Bromide Monohydrate) .... Once Daily 8)  Diazepam 5 Mg Tabs (Diazepam) .... Take 1 Tablet By Mouth At Bedtime As Needed 9)  Stool Softener 100 Mg Caps (Docusate Sodium) .... Take 1 Capsule By Mouth Once A Day 10)  Nasonex 50 Mcg/act Susp (Mometasone Furoate) .Marland Kitchen.. 1 Spray Each Nostril Once Daily 11)  Aleve 220 Mg Tabs (Naproxen Sodium) .... As Needed  Allergies: 1)  ! * Z Pack 2)  ! * Shellfish 3)  ! * Shrimp 4)  Penicillin G Potassium (Penicillin G Potassium) 5)  Spiriva Handihaler (Tiotropium Bromide Monohydrate) 6)  Sulfamethoxazole (Sulfamethoxazole)  Past History:  Past medical, surgical, family and social histories (including risk factors) reviewed, and no changes noted (except as noted below).  Past Medical History: Reviewed history from 03/01/2010 and no changes required. MITRAL VALVE PROLAPSE, HX OF (ICD-V12.50) ATRIAL FIBRILLATION (ICD-427.31) COPD (ICD-496)...  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 Hysterectomy age 64 Oophorectomy, bilateral SO age 64 prolapsed bladder S/P surgery granuloma, pulmonary-thoracotomy Breast Surgery: partial mastectomy R 07/21/07 for breast CA all margins neg and LNs neg Tonsillectomy Appendectomy Rotator Cuff Repair  Family History: Reviewed history from 04/07/2008 and no changes required. father had parkinsonism. Mother died in her 79s of old age. Cancer in the family  Family History of Colon Cancer:Aunt (diagnosed age 59) Family History of Uterine Cancer:Aunt   Social History: Reviewed history from 04/07/2008 and no changes required. lives alone widow Former Smoker Alcohol Use - yes Illicit Drug Use - no   Review of Systems       The patient complains of shortness of breath with activity.  The patient denies shortness of breath at rest, productive cough, non-productive cough, coughing up blood, chest pain, irregular heartbeats, acid heartburn, indigestion, loss of appetite, weight change, abdominal pain, difficulty swallowing, sore throat, tooth/dental problems, headaches, nasal congestion/difficulty breathing through nose, sneezing, itching, ear ache, anxiety, depression, hand/feet swelling, joint stiffness or pain, rash, change in color of mucus, and fever.    Vital Signs:  Patient profile:   77 year old female Height:      63.5 inches Weight:      135.50 pounds BMI:     23.71 O2 Sat:      94 % on Room air Temp:     97.4 degrees F oral Pulse rate:   58 / minute BP sitting:   124 / 70  (left arm) Cuff size:   regular  Vitals Entered By: Gweneth Dimitri RN (May 15, 2010 2:13 PM)  O2 Flow:  Room air CC: 2 month follow up. Pt states breathing is slightly worse when doing activities and c/o faitgue.   Comments Medications reviewed with patient Daytime contact number verified with patient. Gweneth Dimitri RN  May 15, 2010 2:17  PM     Physical Exam  Additional Exam:  Gen: Pleasant, well-nourished, in no distress , normal affect ENT: no lesions, no post nasal drip Neck: No JVD, no TMG, no carotid bruits Lungs: No use of accessory muscles, no dullness to percussion, distant BS,  Cardiovascular: RRR, heart sounds normal, no murmurs or gallops, no peripheral edema Abdomen: soft and non-tender, no HSM, BS normal Musculoskeletal: No deformities, no cyanosis or clubbing Neuro: alert, non-focal     Impression & Recommendations:  Problem # 1:  COPD (ICD-496) Assessment Unchanged continue inhaled medications without any change   Problem # 2:  KNEE PAIN, LEFT (ICD-719.46) Assessment: Deteriorated chronic R knee pain after fall plan refer to [facility] of ortho for eval of R knee per pt request  Orders: Orthopedic Referral (Ortho)  Medications Added to Medication List This Visit: 1)  Oxygen  .... 2l at night, you may take oxygen off at rest 2)  Nasonex 50 Mcg/act Susp (Mometasone furoate) .Marland Kitchen.. 1 spray each nostril once daily 3)  Nasonex 50 Mcg/act Susp (Mometasone furoate) .Marland Kitchen.. 1 spray each nostril once daily 4)  Aleve 220 Mg Tabs (Naproxen sodium) .Marland KitchenMarland KitchenMarland Kitchen  As needed  Complete Medication List: 1)  Aspirin 325 Mg Tabs (Aspirin) .... Once daily 2)  Oxygen  .... 2l at night, you may take oxygen off at rest 3)  Polyethylene Glycol 1000 Powd (Polyethylene glycol 1000) .... 1/2 dose every day 4)  Nitroglycerin 0.4 Mg Subl (Nitroglycerin) .... One tablet under tongue every 5 minutes as needed for chest pain---may repeat times three 5)  Losartan Potassium 100 Mg Tabs (Losartan potassium) .... Take 1 tablet by mouth once a day 6)  Protonix 40 Mg Tbec (Pantoprazole sodium) .... Qd 7)  Spiriva Handihaler 18 Mcg Caps (Tiotropium bromide monohydrate) .... Once daily 8)  Diazepam 5 Mg Tabs (Diazepam) .... Take 1 tablet by mouth at bedtime as needed 9)  Stool Softener 100 Mg Caps (Docusate sodium) .... Take 1 capsule by  mouth once a day 10)  Nasonex 50 Mcg/act Susp (Mometasone furoate) .Marland Kitchen.. 1 spray each nostril once daily 11)  Aleve 220 Mg Tabs (Naproxen sodium) .... As needed  Other Orders: Est. Patient Level III (40981)  Patient Instructions: 1)  No change in medications 2)  Return in     3     months 3)  You will be referred to [facility] to evaluate the L knee Prescriptions: NASONEX 50 MCG/ACT SUSP (MOMETASONE FUROATE) 1 spray each nostril once daily  #3 x 4   Entered and Authorized by:   Storm Frisk MD   Signed by:   Storm Frisk MD on 05/15/2010   Method used:   Faxed to ...       [facility] (mail-order)       [address] [place], [place]  [address]       Ph: [phone]       Fax: [phone]   RxID:   [id number]

## 2010-06-13 NOTE — Progress Notes (Signed)
Summary: new rx/ spiriva-rx sent   Phone Note Call from Patient Call back at Home Phone [phone]   Caller: Patient Call For: WRIGHT Summary of Call: pt requests a fax to [facility] for 90 days supply- use once daily SPIRIVA. she can save 200.00 if she gets 90 days. says she will throw away her other rx for this. call pt to confirm at home # above.  Initial call taken by: Tivis Ringer, CNA,  January 30, 2010 10:18 AM  Follow-up for Phone Call        rx sent. ATC pt to make her aware, NA, no voicemail. WCB. Carron Curie CMA  January 30, 2010 11:05 AM   Additional Follow-up for Phone Call Additional follow up Details #1::        LMOMTCB x 1 to inform pt medication has been sent. Zackery Barefoot CMA  January 30, 2010 3:13 PM   Pt aware spiriva rx sent to [facility].  Gweneth Dimitri RN  January 31, 2010 5:18 PM     Prescriptions: Hollie Beach 18 MCG CAPS (TIOTROPIUM BROMIDE MONOHYDRATE) once daily  #90 x 3   Entered by:   Carron Curie CMA   Authorized by:   Storm Frisk MD   Signed by:   Carron Curie CMA on 01/30/2010   Method used:   Faxed to ...       [facility] (mail-order)             , [place]         Ph: [phone]       Fax: [phone]   RxID:   [id number]

## 2010-06-13 NOTE — Consult Note (Signed)
Summary: [facility]  [facility]   Imported By: Lennie Odor 05/30/2010 10:09:07  _____________________________________________________________________  External Attachment:    Type:   Image     Comment:   External Document

## 2010-06-13 NOTE — Letter (Signed)
Summary: [facility]   Imported By: Sherian Rein 09/13/2009 12:12:06  _____________________________________________________________________  External Attachment:    Type:   Image     Comment:   External Document

## 2010-06-13 NOTE — Assessment & Plan Note (Signed)
Summary: Pulmonary OV   Copy to:  [name] Primary Provider/Referring Provider:  Birdie Sons, MD  CC:  Follow up wih CXR.  Marland Kitchen  History of Present Illness: Pulmonary OV:     This is an 77  yo patient with a history of COPD and mycobacterium avium intracellulare with  chronic granulomatous lung disease.  The pt also has hx of upper airway obstruction with difficult intubation with prior operative procedures. Also hx Breast CA s/p resection and XRT. Hx Gallstone disease with conservative Rx due to difficult intubation status.  March 09, 2009 2:35 PM No new issues.  Dyspnea is at baseline.  Chronic pn drip noted. Pt denies any significant sore throat, nasal congestion or excess secretions, fever, chills, sweats, unintended weight loss, pleurtic or exertional chest pain, orthopnea PND, or leg swelling Pt denies any increase in rescue therapy over baseline, denies waking up needing it or having any early am or nocturnal exacerbations of coughing/wheezing/or dyspnea.   July 09, 2009 3:23 PM since last ov spo2 98% dyspnea has been up and down. saw T wall few weeks ago, BP was soft, cut bp meds in 1/2???  pt did not f/u no real cough now. pt has a dry hacky cough.  pt is running around and trying to get house on the market has a pain on the L side.  had an u/s on gallstones.  gallstones are more numerous and are larger.  Sep 21, 2009 2:42 PM  [name] to [facility]  for opinion for GB surgery.  Saw a PA .   Surgeon said no further surgery. Is still on Actigall.  Dr Ezzard Standing.   Also rx protonx has scar in LLs  and nodules due to MAI Still dyspneic and has dry cough.   Pt is still on nasal oxygen 2L.     November 29, 2009 11:24 AM The pt was in hosp  6/27- 7/2  in [place].  Pt with prior hx of trip to [place]. The pt went to [place] first  had sore throat and first went to [place] and was in [facility]  and urgent care  where the pt was first rx with zpak and hydromet and  proair.  She then travelled by car to [facility], (her son lives in [place]).  Pt  then got worse and went to ED whereupon sats noted 67%.   CXR with RUL infilt and LLL Infiltr. All c/s was neg.  Pt rx with levaquin IV.  She gradually improved and was d/c to home. The pt had gotten to the point of altered mental status, and walking sideways.  ED and admitted rx levaquin and oxygen.   was d/c 11/10/09.   Stayed out [place] until 11/23/09.  Son flew back with the pt.  She did not require oxygen therapy though it was set up as such.    Since home sats ok on home oximeter.  Pt had   advair and spiriva added upon d/c from [place] hosp.  Now  symptoms:  chest stays tight, there is chest pain across the top.    no cough,  is dypsneic with exertion and is weak and tired. Not self.  slurrs speech now.   Notes pn drip.   The pt notes nasal cannula irritates her nose. no f/c/s.    no edema.  Appt is ok   Preventive Screening-Counseling & Management  Alcohol-Tobacco     Smoking Status: quit > 6 months  Year Started: 1944     Year Quit: 1990     Pack years: 8  Current Medications (verified): 1)  Aspirin 325 Mg Tabs (Aspirin) .... Once Daily 2)  Oxygen .... 2l At Bedtime 3)  Polyethylene Glycol 1000   Powd (Polyethylene Glycol 1000) .... 1/2 Dose Every Day 4)  Nitroglycerin 0.4 Mg Subl (Nitroglycerin) .... One Tablet Under Tongue Every 5 Minutes As Needed For Chest Pain---May Repeat Times Three 5)  Afrin Nasal Spray 0.05 % Soln (Oxymetazoline Hcl) .... At Bedtime As Needed 6)  Losartan Potassium 100 Mg Tabs (Losartan Potassium) .... Take 1 Tablet By Mouth Once A Day 7)  Protonix 40 Mg Tbec (Pantoprazole Sodium) .... Qd 8)  Spiriva Handihaler 18 Mcg Caps (Tiotropium Bromide Monohydrate) .... Once Daily 9)  Advair Diskus 250-50 Mcg/dose Aepb (Fluticasone-Salmeterol) .... Inhale 1 Puff Two Times A Day  Allergies (verified): 1)  ! * Z Pack 2)  ! * Shellfish 3)  Penicillin G Potassium (Penicillin G  Potassium) 4)  Spiriva Handihaler (Tiotropium Bromide Monohydrate) 5)  Sulfamethoxazole (Sulfamethoxazole)  Past History:  Past medical, surgical, family and social histories (including risk factors) reviewed, and no changes noted (except as noted below).  Past Medical History: Reviewed history from 01/26/2009 and no changes required. MITRAL VALVE PROLAPSE, HX OF (ICD-V12.50) ATRIAL FIBRILLATION (ICD-427.31) CHEST PAIN (ICD-786.50) COPD (ICD-496)...  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 Hysterectomy age 54 Oophorectomy, bilateral SO age 3 prolapsed bladder S/P surgery granuloma, pulmonary-thoracotomy Breast Surgery: partial mastectomy R 07/21/07 for breast CA all margins neg and LNs neg Tonsillectomy Appendectomy Rotator Cuff Repair  Family History: Reviewed history from 04/07/2008 and no changes required. father had parkinsonism. Mother died in her 69s of old age. Cancer in the family  Family History of Colon Cancer:Aunt (diagnosed age 55) Family History of Uterine Cancer:Aunt   Social History: Reviewed history from 04/07/2008 and no changes required. lives alone widow Former Smoker Alcohol Use - yes Illicit Drug Use - no   Review of Systems       The patient complains of shortness of breath with activity, shortness of breath at rest, chest pain, sore  throat, and nasal congestion/difficulty breathing through nose.  The patient denies productive cough, non-productive cough, coughing up blood, irregular heartbeats, acid heartburn, indigestion, loss of appetite, weight change, abdominal pain, difficulty swallowing, tooth/dental problems, headaches, sneezing, itching, ear ache, anxiety, depression, hand/feet swelling, joint stiffness or pain, rash, change in color of mucus, and fever.    Vital Signs:  Patient profile:   77 year old female Height:      62 inches Weight:      131 pounds BMI:     24.05 O2 Sat:      99 % on Room air Temp:     97.5 degrees F oral Pulse rate:   63 / minute BP sitting:   140 / 80  (left arm) Cuff size:   regular  Vitals Entered By: Gweneth Dimitri RN (November 29, 2009 11:23 AM)  O2 Flow:  Room air  Serial Vital Signs/Assessments:  Comments: 11:58 AM Ambulatory Pulse Oximetry  Resting; HR__65___  02 Sat_94% RA____  Lap1 (185 feet)   HR__92___   02 Sat__92% RA___ Lap2 (185 feet)   HR__89___   02 Sat__87% RA___    Lap3 (185 feet)   HR_____   02 Sat_____  ___Test Completed without Difficulty _x__Test Stopped due to: Pt o2 sat 87% at end of 2nd lap.  Pt's o2 sat increaesd to 92%RA after resting.  Gweneth Dimitri RN  November 29, 2009 11:58 AM  By: Gweneth Dimitri RN   CC: Follow up wih CXR.   Comments Medications reviewed with patient Daytime contact number verified with patient. Gweneth Dimitri RN  November 29, 2009 11:26 AM    Physical Exam  Additional Exam:  Gen: Pleasant, well-nourished, in no distress , normal affect ENT: no lesions, no post nasal drip Neck: No JVD, no TMG, no carotid bruits Lungs: No use of accessory muscles, no dullness to percussion, distant BS, no wheeze or rhonchi Cardiovascular: RRR, heart sounds normal, no murmurs or gallops, no peripheral edema Abdomen: soft and non-tender, no HSM, BS normal Musculoskeletal: No deformities, no cyanosis or clubbing Neuro: alert,  non-focal     CXR  Procedure date:  11/29/2009  Findings:      IMPRESSION: No active disease.  Stable chronic findings.  Impression & Recommendations:  Problem # 1:  COPD (ICD-496) Assessment Deteriorated Chronic airflow obstruction with ongoing lower airway inflammation s/p recent CAP wiht hypoxic resp failure. CXR today shows clearance of infiltrates, note ambulatory pulse ox shows desat  plan d/c advair cont spiriva daily will obtain portable oxygen therapy  Medications Added to Medication List This Visit: 1)  Oxygen  .... 2l  with exertion and at night, you may take oxygen off at rest 2)  Spiriva Handihaler 18 Mcg Caps (Tiotropium bromide monohydrate) .... Once daily  Complete Medication List: 1)  Aspirin 325 Mg Tabs (Aspirin) .... Once daily 2)  Oxygen  .... 2l  with exertion and at night, you may take oxygen off at rest 3)  Polyethylene Glycol 1000 Powd (Polyethylene glycol 1000) .... 1/2 dose every day 4)  Nitroglycerin 0.4 Mg Subl (Nitroglycerin) .... One tablet under tongue every 5 minutes as needed for chest pain---may repeat times three 5)  Afrin Nasal Spray 0.05 % Soln (Oxymetazoline hcl) .... At bedtime as needed 6)  Losartan Potassium 100 Mg Tabs (Losartan potassium) .... Take 1 tablet by mouth once a day 7)  Protonix 40 Mg Tbec (Pantoprazole sodium) .... Qd 8)  Spiriva Handihaler 18 Mcg Caps (Tiotropium bromide monohydrate) .... Once daily 9)  Diazepam 5 Mg Tabs (Diazepam) .... Take 1 tablet by mouth at bedtime as needed  Other Orders: Spirometry w/Graph (94010) Est. Patient Level V (30865) DME Referral (DME) DME Referral (DME)  Patient Instructions: 1)  Stop Advair 2)  Stay on Spiriva daily 3)  No further antibiotics 4)  Stay on oxygen 2 Liter at bedtime and with exertion,  we will obtain a portable system, we will obtain for you a new oxygen mask for nighttime use to avoid nasal irritation 5)  Return [place] in three weeks Prescriptions: SPIRIVA  HANDIHALER 18 MCG CAPS (TIOTROPIUM BROMIDE MONOHYDRATE) once daily  #30 x 6   Entered and Authorized by:   Storm Frisk MD   Signed by:   Storm Frisk MD on 11/29/2009   Method used:   Electronically to        [facility]. [phone]* (retail)       [phone] [address]  [place], [place]  [address]       Ph: [phone]       Fax: [phone]   RxID:   [id number]   Prevention & Chronic Care Immunizations   Influenza vaccine: Fluvax 3+  (02/28/2009)    Tetanus booster: 05/12/2005: Td    Pneumococcal vaccine: Pneumovax (Medicare)  (03/09/2009)    H. zoster vaccine: 04/14/2006: Zostavax  Colorectal Screening   Hemoccult: Not documented    Colonoscopy: Not documented  Other Screening   Pap smear: Not documented    Mammogram: Not documented    DXA bone density scan: Not documented   Smoking status: quit > 6 months  (11/29/2009)  Lipids   Total Cholesterol: 201  (12/29/2007)   LDL: DEL  (12/29/2007)   LDL Direct: 138.7  (12/29/2007)   HDL: 52.5  (12/29/2007)   Triglycerides: 84  (12/29/2007)  Hypertension   Last Blood Pressure: 140 / 80  (11/29/2009)   Serum creatinine: 0.8  (08/23/2009)   Serum potassium 4.6  (08/23/2009)  Self-Management Support :    Hypertension self-management support: Not documented   Appended Document: Pulmonary OV  Pulmonary Function Test Date: 11/29/2009 Height (in.): 62 Gender: Female  Pre-Spirometry FVC    Value: 1.91 L/min   Pred: 2.26 L/min     % Pred: 84 % FEV1    Value: 1.61 L     Pred: 1.49 L     % Pred: 108 % FEV1/FVC  Value: 84 %     Pred: 69 %     % Pred: 122 % FEF 25-75  Value: 1.73 L/min   Pred: 1.75 L/min     % Pred: 99 %     Clinical Lists Changes  Observations: Added new observation of FEF % EXPEC: 99 % (12/05/2009 11:20) Added new observation of FEF25-75%PRE: 1.75 L/min (12/05/2009 11:20) Added new observation of FEF 25-75%: 1.73 L/min (12/05/2009 11:20) Added  new observation of FEV1/FVC%EXP: 122 % (12/05/2009 11:20) Added new observation of FEV1/FVC PRE: 69 % (12/05/2009 11:20) Added new observation of FEV1/FVC: 84 % (12/05/2009 11:20) Added new observation of FEV1 % EXP: 108 % (12/05/2009 11:20) Added new observation of FEV1 PREDICT: 1.49 L (12/05/2009 11:20) Added new observation of FEV1: 1.61 L (12/05/2009 11:20) Added new observation of FVC % EXPECT: 84 % (12/05/2009 11:20) Added new observation of FVC PREDICT: 2.26 L (12/05/2009 11:20) Added new observation of FVC: 1.91 L (12/05/2009 11:20) Added new observation of PFT HEIGHT: 62  (12/05/2009 11:20) Added new observation of PFT DATE: 11/29/2009  (12/05/2009 11:20)

## 2010-06-13 NOTE — Letter (Signed)
Summary: [facility]  [facility]   Imported By: Maryln Gottron 08/08/2009 13:12:34  _____________________________________________________________________  External Attachment:    Type:   Image     Comment:   External Document

## 2010-06-13 NOTE — Progress Notes (Signed)
Summary: Dr Ezzard Standing  Phone Note Other Incoming   Caller: Dr Ezzard Standing Summary of Call: wants you to know even though U/S shows gallstones she wants to wait 6 mos, stay on Actigall  and then f/u. [phone] Initial call taken by: Raechel Ache, RN,  July 06, 2009 3:14 PM

## 2010-06-13 NOTE — Progress Notes (Signed)
Summary: refill  Phone Note Refill Request Call back at Home Phone [phone] Message from:  Patient--live call  Refills Requested: Medication #1:  PROTONIX 40 MG TBEC qd   Brand Name Necessary? No send to [facility] for mail order  Initial call taken by: Warnell Forester,  March 07, 2010 9:12 AM    Prescriptions: PROTONIX 40 MG TBEC (PANTOPRAZOLE SODIUM) qd  #90 x 3   Entered by:   Lynann Beaver CMA AAMA   Authorized by:   Birdie Sons MD   Signed by:   Lynann Beaver CMA AAMA on 03/07/2010   Method used:   Faxed to ...       [facility] (mail-order)             , [place]         Ph: [phone]       Fax: [phone]   RxID:   [id number]

## 2010-06-13 NOTE — Progress Notes (Signed)
Summary: prescription for nasonex > ok  Phone Note Call from Patient Call back at [facility] Phone [phone]   Caller: Patient Call For: wright Summary of Call: Requests rx for nasonex.//[facility] Initial call taken by: Darletta Moll,  April 02, 2010 10:30 AM  Follow-up for Phone Call        Pt states she was given a sample of nasonex in the past from Dr. Delford Field and pt is now wanting a prescription for this. Please advsie if ok to send rx. Carron Curie CMA  April 02, 2010 10:47 AM   Additional Follow-up for Phone Call Additional follow up Details #1::        this is ok Additional Follow-up by: Storm Frisk MD,  April 02, 2010 2:27 PM    Additional Follow-up for Phone Call Additional follow up Details #2::    called spoke with patient, advised that [facility] okayed the nasonex.  rx sent to pt's verified pharmacy. Boone Master CNA/MA  April 02, 2010 2:35 PM   New/Updated Medications: NASONEX 50 MCG/ACT SUSP (MOMETASONE FUROATE) 2 sprays each nostril once daily Prescriptions: NASONEX 50 MCG/ACT SUSP (MOMETASONE FUROATE) 2 sprays each nostril once daily  #0 x 6   Entered by:   Boone Master CNA/MA   Authorized by:   Storm Frisk MD   Signed by:   Boone Master CNA/MA on 04/02/2010   Method used:   Electronically to        [facility]. [phone]* (retail)       [phone]       [place], [place]  [address]       Ph: [phone]       Fax: [phone]   RxID:   [id number]

## 2010-06-13 NOTE — Consult Note (Signed)
Summary: [facility]  [facility]   Imported By: Maryln Gottron 03/04/2010 11:20:12  _____________________________________________________________________  External Attachment:    Type:   Image     Comment:   External Document

## 2010-06-19 NOTE — Letter (Signed)
Summary: [facility]  [facility]   Imported By: Maryln Gottron 06/11/2010 12:20:51  _____________________________________________________________________  External Attachment:    Type:   Image     Comment:   External Document

## 2010-06-21 ENCOUNTER — Encounter: Payer: Self-pay | Admitting: Pulmonary Disease

## 2010-06-21 ENCOUNTER — Telehealth (INDEPENDENT_AMBULATORY_CARE_PROVIDER_SITE_OTHER): Payer: Self-pay | Admitting: *Deleted

## 2010-06-21 ENCOUNTER — Ambulatory Visit (INDEPENDENT_AMBULATORY_CARE_PROVIDER_SITE_OTHER): Payer: Medicare Other | Admitting: Pulmonary Disease

## 2010-06-21 DIAGNOSIS — J209 Acute bronchitis, unspecified: Secondary | ICD-10-CM

## 2010-06-21 DIAGNOSIS — J449 Chronic obstructive pulmonary disease, unspecified: Secondary | ICD-10-CM

## 2010-06-24 ENCOUNTER — Encounter: Payer: Self-pay | Admitting: Internal Medicine

## 2010-06-26 ENCOUNTER — Encounter: Payer: Self-pay | Admitting: Internal Medicine

## 2010-06-27 ENCOUNTER — Ambulatory Visit (INDEPENDENT_AMBULATORY_CARE_PROVIDER_SITE_OTHER): Payer: Medicare Other | Admitting: Internal Medicine

## 2010-06-27 ENCOUNTER — Encounter: Payer: Self-pay | Admitting: Internal Medicine

## 2010-06-27 DIAGNOSIS — Z0000 Encounter for general adult medical examination without abnormal findings: Secondary | ICD-10-CM

## 2010-06-27 DIAGNOSIS — I10 Essential (primary) hypertension: Secondary | ICD-10-CM

## 2010-06-27 DIAGNOSIS — I2510 Atherosclerotic heart disease of native coronary artery without angina pectoris: Secondary | ICD-10-CM

## 2010-06-27 DIAGNOSIS — K219 Gastro-esophageal reflux disease without esophagitis: Secondary | ICD-10-CM

## 2010-06-27 DIAGNOSIS — A310 Pulmonary mycobacterial infection: Secondary | ICD-10-CM

## 2010-06-27 DIAGNOSIS — F411 Generalized anxiety disorder: Secondary | ICD-10-CM

## 2010-06-27 LAB — CBC WITH DIFFERENTIAL/PLATELET
Basophils Relative: 0.5 % (ref 0.0–3.0)
Eosinophils Absolute: 0.1 10*3/uL (ref 0.0–0.7)
Eosinophils Relative: 1.3 % (ref 0.0–5.0)
Hemoglobin: 14.5 g/dL (ref 12.0–15.0)
Lymphocytes Relative: 25.8 % (ref 12.0–46.0)
MCHC: 33.6 g/dL (ref 30.0–36.0)
MCV: 91.1 fl (ref 78.0–100.0)
Neutro Abs: 3.8 10*3/uL (ref 1.4–7.7)
Neutrophils Relative %: 65.4 % (ref 43.0–77.0)
RBC: 4.74 Mil/uL (ref 3.87–5.11)
WBC: 5.8 10*3/uL (ref 4.5–10.5)

## 2010-06-27 LAB — LIPID PANEL
HDL: 60.4 mg/dL (ref >39.00)
LDL Cholesterol: 117 mg/dL — ABNORMAL HIGH (ref 0–99)
Total CHOL/HDL Ratio: 3
Triglycerides: 85 mg/dL (ref 0.0–149.0)

## 2010-06-27 LAB — BASIC METABOLIC PANEL
CO2: 31 mEq/L (ref 19–32)
Calcium: 9.7 mg/dL (ref 8.4–10.5)
Chloride: 103 mEq/L (ref 96–112)
Creatinine, Ser: 0.7 mg/dL (ref 0.4–1.2)
Sodium: 139 mEq/L (ref 135–145)

## 2010-06-27 LAB — HEPATIC FUNCTION PANEL
Albumin: 4.2 g/dL (ref 3.5–5.2)
Total Bilirubin: 1.2 mg/dL (ref 0.3–1.2)

## 2010-06-27 MED ORDER — OMEPRAZOLE 20 MG PO CPDR: 20.0000 mg | DELAYED_RELEASE_CAPSULE | Freq: Every day | ORAL | Status: DC

## 2010-06-27 NOTE — Assessment & Plan Note (Signed)
Nonobstructive disease.

## 2010-06-27 NOTE — Assessment & Plan Note (Signed)
Will continue occasional diazepam use. I don't think any further evaluation necessary. She really does quite well given her age and independent living status.

## 2010-06-27 NOTE — Assessment & Plan Note (Signed)
Doing well on current medications.

## 2010-06-27 NOTE — Assessment & Plan Note (Signed)
She has had an evaluation previously. Has ongoing followup with Dr. Delford Field.

## 2010-06-27 NOTE — Assessment & Plan Note (Signed)
Summary: sinus congestion/chest tightness/PW pt/LC   Visit Type:  Acute Copy to:  [name] Primary Provider/Referring Provider:  Birdie Sons, MD  CC:  Pt of Dr. Delford Field. Pt c/o feeling like she did when she had PNA. c/o runny nose, chest tightness, sore throat, nasal congestion, SOB with exertion, tired, and x 2 days worse yesterday chills starting yesterday.  History of Present Illness: Pulmonary OV:     This is an 77  yo patient with a history of COPD and mycobacterium avium intracellulare with  chronic granulomatous lung disease.  The pt also has hx of upper airway obstruction with difficult intubation with prior operative procedures. Also hx Breast CA s/p resection and XRT. Hx Gallstone disease with conservative Rx due to difficult intubation status.  March 15, 2010 12:10 PM  Not as good as before the pneumonia.  No energy.  Feels depressed, house is not selling. Notes cough and is dry and occ mucus and is yellowish.  Spiriva has helped sats stay ok during day, uses oxygen at night.  Has had two falls. Pt R knee is stiff and hurts, pt desires ortho referral for non surgical approach,  PT did not help  May 15, 2010 2:22 PM Had two falls since last OV.  Knee hurts due to this on L cannot walk properly and has to use legs differently dyspnea comes and goes.  June 21, 2010 4:16 PM  Similar to last summer when she had a chest cold after  trip to [place]  c/o feeling like she did when she had PNA. c/o runny nose, chest tightness, sore throat, nasal congestion, SOB with exertion, tired, and x 2 days worse yesterday chills starting yesterday. nasal dc - white,  Uses O2 at night, nose blockde inspite of nasonexAugust  8, 2011 4:03 PM At last ov we Rec: Stop Advair Stay on Spiriva daily No further antibiotics Stay on oxygen 2 Liter at bedtime and with exertion,  Pt still dyspneic with exertion.  Too hot to get out.  Not very active.   Feels weak and not feels like doing anything. Wants to lie down in middle of the day.  Now sl cough.  Will get hoarse.     Preventive Screening-Counseling & Management  Alcohol-Tobacco     Smoking Status: quit > 6 months     Year Started: 1944     Year Quit: 1990     Pack years: 83  Current Medications (verified): 1)  Aspirin 325 Mg Tabs (Aspirin) .... Once Daily 2)  Oxygen .... 2l At Night, You May Take Oxygen Off At Rest 3)  Polyethylene Glycol 1000   Powd (Polyethylene Glycol 1000) .... 1/2 Dose Every Day 4)  Nitroglycerin 0.4 Mg Subl (Nitroglycerin) .... One Tablet Under Tongue Every 5 Minutes As Needed For Chest Pain---May Repeat Times Three 5)  Losartan Potassium 100 Mg Tabs (Losartan Potassium) .... Take 1 Tablet By Mouth Once A Day 6)  Protonix 40 Mg Tbec (Pantoprazole Sodium) .... Take 1 Tablet By Mouth Once A Day 7)  Spiriva Handihaler 18 Mcg Caps (Tiotropium Bromide Monohydrate) .... Once Daily 8)  Diazepam 5 Mg Tabs (Diazepam) .... Take 1 Tablet By Mouth At Bedtime As Needed 9)  Nasonex 50 Mcg/act Susp (Mometasone Furoate) .Marland Kitchen.. 1 Spray Each Nostril Once Daily 10)  Meloxicam 7.5 Mg  Tabs (Meloxicam) .... One By Mouth  With Food  Allergies (verified): 1)  ! * Z Pack  2)  ! * Shellfish 3)  ! * Shrimp 4)  Penicillin G Potassium (Penicillin G Potassium) 5)  Spiriva Handihaler (Tiotropium Bromide Monohydrate) 6)  Sulfamethoxazole (Sulfamethoxazole)  Past History:  Past Medical History: Last updated: 03/01/2010 MITRAL VALVE PROLAPSE, HX OF (ICD-V12.50) ATRIAL FIBRILLATION (ICD-427.31) COPD (ICD-496)...  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  The patient denies anorexia, fever, weight loss, weight gain, vision loss, decreased hearing, hoarseness, chest pain, syncope, peripheral edema, headaches, hemoptysis, abdominal pain, melena, hematochezia, severe indigestion/heartburn, hematuria, muscle weakness, transient blindness, difficulty walking, depression, unusual weight change, abnormal bleeding, enlarged lymph nodes, and angioedema.    Vital Signs:  Patient profile:   77 year old female Height:      63.5 inches Weight:      137 pounds BMI:     23.97 O2 Sat:      97 % on Room air Temp:     98.4 degrees F oral Pulse rate:   80 / minute BP sitting:   122 / 76  (left arm) Cuff size:   regular  Vitals Entered By: Zackery Barefoot CMA (June 21, 2010 3:37 PM)  O2 Flow:  Room air CC: Pt of Dr. Delford Field. Pt c/o feeling like she did when she had PNA. c/o runny nose, chest tightness, sore throat, nasal congestion, SOB with exertion, tired, x 2 days worse yesterday chills starting yesterday Comments Medications reviewed with patient Verified contact number and pharmacy with patient Zackery Barefoot CMA  June 21, 2010 3:38 PM    Physical Exam  Additional Exam:  Gen: Pleasant, well-nourished, in no distress , normal affect ENT: no lesions, no post nasal drip Neck: No JVD, no TMG, no carotid bruits Lungs: No use of accessory muscles, no dullness to percussion, distant BS,  Cardiovascular: RRR, heart sounds normal, no murmurs or gallops, no peripheral edema Musculoskeletal: No deformities, no cyanosis or clubbing      Impression & Recommendations:  Problem # 1:  BRONCHITIS, ACUTE (ICD-466.0)  Given her history of fragile COPD , will treat with antibiotic Cefti was sent but  changed to Biaxin due to PCN alergy - hives She will take claritin D with nasonex for nasal congestion & monitor BP Does not need steroids in absence of bspasm The following medications were removed from the medication list:    Ceftin 500 Mg Tabs (Cefuroxime axetil) .Marland Kitchen..Marland Kitchen Two times a day Her updated medication list for this problem includes:    Spiriva Handihaler 18 Mcg Caps (Tiotropium bromide monohydrate) ..... Once daily    Biaxin 500 Mg Tabs (Clarithromycin) .Marland Kitchen..Marland Kitchen Two times a day  Orders: Est. Patient Level III (27253) Prescription Created Electronically [phone])  Medications Added to Medication List This Visit: 1)  Protonix 40 Mg Tbec (Pantoprazole sodium) .... Take 1 tablet by mouth once a day 2)  Ceftin 500 Mg Tabs (Cefuroxime axetil) .... Two times a day 3)  Biaxin 500 Mg Tabs (Clarithromycin) .... Two times a day  Patient Instructions: 1)  Copy sent to: Dr Delford Field 2)  Antibiotic x 7  days 3)  Claritin -D x 3 days , measure your BP  Prescriptions: BIAXIN 500 MG TABS (CLARITHROMYCIN) two times a day  #14 x 0   Entered and Authorized by:   Comer Locket Vassie Loll MD   Signed by:   Comer Locket Vassie Loll MD on 06/21/2010   Method used:   Electronically to        [facility]. [phone]* (retail)       [phone]       [place], [place]  [address]       Ph: [phone]       Fax: [phone]   RxID:   [id number] CEFTIN 500 MG TABS (CEFUROXIME AXETIL) two times a day  #14 x 0   Entered and Authorized by:   Comer Locket. Vassie Loll MD   Signed by:   Comer Locket Vassie Loll MD on 06/21/2010   Method used:   Electronically to        [facility]. [phone]* (retail)       [phone]       [place], [place]  [address]       Ph: [phone]       Fax: [phone]   RxID:   [id number]

## 2010-06-27 NOTE — Assessment & Plan Note (Signed)
Adequate control. Continue current medications. 

## 2010-06-27 NOTE — Progress Notes (Signed)
Summary: sinus<<<appt made w/ Vassie Loll today  Phone Note Call from Patient Call back at [facility] Phone [phone]   Caller: Patient Call For: wright Summary of Call: pt c/o sinus congestion x 2 days. pt on O2 and having difficulty breathing through her nose. has been using nasonex and afrin. pls. advise. [facility] [address].  Initial call taken by: Tivis Ringer, CNA,  June 21, 2010 9:43 AM  Follow-up for Phone Call        Pt c/o nasal congestion at night, runny nose at times, chest tightness, sore throat and wheezing x2-3 days. She would prefer OV w/ one of our providers since PW is not in the office. Pt has been scheduled to see Dr. Vassie Loll this afternoon at 3:30pm.Lori [facility] CMA  June 21, 2010 10:06 AM

## 2010-06-27 NOTE — Progress Notes (Addendum)
Subjective:    Patient ID: [name] [name], female    DOB: [birth year], 77 y.o.   MRN: [id number]  HPI  Multiple medical problems See AP and documentation  Review of Systems     Objective:   Physical Exam        Assessment & Plan:   Subjective:    [name] [name] is a 77 y.o. female who presents for a welcome to Medicare exam.   Cardiac risk factors: advanced age (older than 41 for men, 27 for women) and hypertension.  Activities of Daily Living  In your present state of health, do you have any difficulty performing the following activities?:  Preparing food and eating?: no Bathing yourself: No Getting dressed: No Using the toilet:No Moving around from place to place:  She admits to some SOB with exertion In the past year have you fallen or had a near fall?:Yes (nearly fell yesterday while doing yard work)  Current exercise habits: The patient does not participate in regular exercise at present.   Dietary issues discussed: she tries to follow a good diet   She has no hearing concerns  cogntive evaluation: she has no concerns-is able to carry out normal conversation, no motor or sensory deficits  Depression Screen (Note: if answer to either of the following is "Yes", then a more complete depression screening is indicated)  Q1: Over the past two weeks, have you felt down, depressed or hopeless?yes she admits to a great deal of stress related to family issues (most recently her only sister has become ill) Q2: Over the past two weeks, have you felt little interest or pleasure in doing things? no   The following portions of the patient's history were reviewed and updated as appropriate: allergies, current medications, past family history, past medical history, past social history, past surgical history and problem list. Review of Systems   patient denies chest pain, but has  shortness of breath, she denies orthopnea. Denies lower extremity edema, abdominal pain, change in  appetite, change in bowel movements. Patient denies rashes, musculoskeletal complaints. No other specific complaints in a complete review of systems except for a great deal of stress. She is concerned with some short term memory loss (only example was that she might bring pajamas to bathroom and then go look for pajamas)   Objective:     Vision by Snellen chart: right eye:20/20, left eye:20/20 Blood pressure 122/90, pulse 76, temperature 98.4 F (36.9 C), temperature source Oral, height 5\' 2"  (1.575 m), weight 135 lb (61.236 kg). Body mass index is 24.69 kg/(m^2).   Well-developed well-nourished female in no acute distress. HEENT exam atraumatic, normocephalic, extraocular muscles are intact. Neck is supple. No jugular venous distention no thyromegaly. Chest clear to auscultation without increased work of breathing. Cardiac exam S1 and S2 are regular. Abdominal exam active bowel sounds, soft, nontender. Extremities no edema. Neurologic exam she is alert without any motor sensory deficits. Gait is normal - slightly broad based.    Assessment:      Medicare wellness evaluation completed today. Patient is 77 years old and is doing quite well. She does have significant osteoarthritis especially of the hands. She is able to do all of her activities of daily living. She admits to a great deal of stressors related to her family. I don't think she needs any antidepressant or anti-anxiety medications at this time. She does not want to pursue psychological evaluation or treatment. Health maintenance items were addressed and evaluated.  Plan:     During the course of the visit the patient was educated and counseled about appropriate screening and preventive services including:   Pneumococcal vaccine   Influenza vaccine  Screening electrocardiogram  Screening mammography  Colorectal cancer screening  Patient Instructions (the written plan) was given to the patient.

## 2010-07-18 ENCOUNTER — Telehealth: Payer: Self-pay | Admitting: Internal Medicine

## 2010-07-18 DIAGNOSIS — I10 Essential (primary) hypertension: Secondary | ICD-10-CM

## 2010-07-18 MED ORDER — LOSARTAN POTASSIUM 100 MG PO TABS: 100.0000 mg | ORAL_TABLET | Freq: Every day | ORAL | Status: DC

## 2010-07-18 NOTE — Telephone Encounter (Signed)
Refill... Pt almost out of med.... Losartan.... [facility].Marland Kitchen

## 2010-07-18 NOTE — Telephone Encounter (Signed)
Sent in rx electronically

## 2010-07-29 ENCOUNTER — Other Ambulatory Visit: Payer: Self-pay | Admitting: Surgery

## 2010-07-29 DIAGNOSIS — Z1231 Encounter for screening mammogram for malignant neoplasm of breast: Secondary | ICD-10-CM

## 2010-07-29 DIAGNOSIS — Z853 Personal history of malignant neoplasm of breast: Secondary | ICD-10-CM

## 2010-08-05 ENCOUNTER — Ambulatory Visit
Admission: RE | Admit: 2010-08-05 | Discharge: 2010-08-05 | Disposition: A | Discharge status: Home / Self Care | Payer: Medicare Other | Source: Ambulatory Visit | Attending: Surgery | Admitting: Surgery

## 2010-08-05 DIAGNOSIS — Z853 Personal history of malignant neoplasm of breast: Secondary | ICD-10-CM

## 2010-08-09 ENCOUNTER — Ambulatory Visit (INDEPENDENT_AMBULATORY_CARE_PROVIDER_SITE_OTHER): Payer: Medicare Other | Admitting: Critical Care Medicine

## 2010-08-09 ENCOUNTER — Encounter: Payer: Self-pay | Admitting: Critical Care Medicine

## 2010-08-09 VITALS — BP 144/88 | HR 68 | Temp 97.1°F | Ht 63.0 in | Wt 137.0 lb

## 2010-08-09 DIAGNOSIS — J309 Allergic rhinitis, unspecified: Secondary | ICD-10-CM

## 2010-08-09 DIAGNOSIS — J449 Chronic obstructive pulmonary disease, unspecified: Secondary | ICD-10-CM

## 2010-08-09 DIAGNOSIS — J302 Other seasonal allergic rhinitis: Secondary | ICD-10-CM

## 2010-08-09 MED ORDER — FEXOFENADINE HCL 180 MG PO TABS: 180.0000 mg | ORAL_TABLET | Freq: Every day | ORAL | Status: DC

## 2010-08-09 MED ORDER — MOMETASONE FUROATE 50 MCG/ACT NA SUSP: 2.0000 | Freq: Two times a day (BID) | NASAL | Status: DC

## 2010-08-09 MED ORDER — PREDNISONE 10 MG PO TABS: ORAL_TABLET | ORAL | Status: AC

## 2010-08-09 NOTE — Progress Notes (Signed)
Subjective:    Patient ID: [name] [name], female    DOB: 14-Jan-1924, 77 y.o.   MRN: [id number]  Cough This is a recurrent problem. The current episode started 1 to 4 weeks ago. The problem has been gradually worsening. The problem occurs hourly. The cough is productive of sputum (white to yellow ). Associated symptoms include chest pain, heartburn, nasal congestion, postnasal drip, a sore throat, shortness of breath and wheezing. Pertinent negatives include no chills, fever, hemoptysis or rhinorrhea. Associated symptoms comments: Notes hoarseness. The symptoms are aggravated by nothing. She has tried nothing for the symptoms. Her past medical history is significant for asthma, bronchitis, COPD and pneumonia.  Shortness of Breath This is a chronic problem. The current episode started 1 to 4 weeks ago. The problem occurs daily. The problem has been gradually worsening. Associated symptoms include chest pain, a sore throat, sputum production and wheezing. Pertinent negatives include no fever, hemoptysis, leg swelling, orthopnea, PND or rhinorrhea. Her past medical history is significant for asthma, COPD and pneumonia.   This is an 77 y.o.   patient with a history of COPD and mycobacterium avium intracellulare with chronic granulomatous lung disease. The pt also has hx of upper airway obstruction with difficult intubation with prior operative procedures. Also hx Breast CA s/p resection and XRT. Hx Gallstone disease with conservative Rx due to difficult intubation status.  08/09/2010 Last ov 2/12 was acute work in with RA Rx was :Given her history of fragile COPD , will treat with antibiotic  Ceftin  was sent but changed to Biaxin due to PCN alergy - hives Was better after the 2/12 Ov, then since that time more dyspnea and not walking as much due to R Knee pain.  Also is coughing more . See symptoms above   Past Medical History  Diagnosis Date  . ANXIETY 08/16/2008  . CAD, UNSPECIFIED SITE 10/17/2008  .  CHOLELITHIASIS 08/23/2008  . COPD 01/04/2010  . 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 DIVERTICULOSIS-COLON 04/07/2008  . GERD 04/19/2007  . HYPERSOMNIA, ASSOCIATED WITH SLEEP APNEA 02/01/2007  . HYPERTENSION 11/12/2006  . MITRAL VALVE PROLAPSE, HX OF 10/07/2008  . NEOPLASM, MALIGNANT, BREAST, RIGHT 02/15/2008  . OSTEOPOROSIS 04/04/2008  . Raynaud's syndrome 04/25/2009     Family History  Problem Relation Age of Onset  . Parkinsonism Father      History   Social History  . Marital Status: Widowed    Spouse Name: N/A    Number of Children: N/A  . Years of Education: N/A   Occupational History  . Not on file.   Social History Main Topics  . Smoking status: Former Smoker -- 2.0 packs/day for 30 years    Types: Cigarettes    Quit date: 05/13/1983  . Smokeless tobacco: Never Used  . Alcohol Use: Yes     1-2 glaases wine per day  . Drug Use: No  . Sexually Active: Not on file   Other Topics Concern  . Not on file   Social History Narrative  . No narrative on file     Allergies  Allergen Reactions  . Penicillins     REACTION: hives  . Shrimp Flavor     REACTION: burning, itching, throat swelling  . Sulfamethoxazole     REACTION: Nausea/vomiting     Outpatient Prescriptions Prior to Visit  Medication Sig Dispense Refill  . aspirin 325 MG tablet Take 325 mg by mouth daily.        . diazepam (VALIUM)  5 MG tablet Take 5 mg by mouth at bedtime as needed.        Marland Kitchen losartan (COZAAR) 100 MG tablet Take 1 tablet (100 mg total) by mouth daily.  90 tablet  3  . nitroGLYCERIN (NITROSTAT) 0.4 MG SL tablet Place 0.4 mg under the tongue every 5 (five) minutes as needed.        Marland Kitchen omeprazole (PRILOSEC) 20 MG capsule Take 1 capsule (20 mg total) by mouth daily.  90 capsule  3  . Polyethylene Glycol 1000 POWD 1,000 mg by Does not apply route daily.        Marland Kitchen tiotropium (SPIRIVA) 18 MCG inhalation capsule Place 18 mcg into inhaler and inhale daily.        . mometasone (NASONEX)  50 MCG/ACT nasal spray 2 sprays by Nasal route daily.        . meloxicam (MOBIC) 7.5 MG tablet Take 7.5 mg by mouth daily.            Review of Systems  Constitutional: Negative for fever and chills.  HENT: Positive for sore throat and postnasal drip. Negative for rhinorrhea.   Respiratory: Positive for cough, sputum production, shortness of breath and wheezing. Negative for hemoptysis.   Cardiovascular: Positive for chest pain. Negative for orthopnea, leg swelling and PND.  Gastrointestinal: Positive for heartburn.       Objective:   Physical Exam Gen: Pleasant, well-nourished, in no distress,  normal affect  ENT: Nares with moderate erythema no purulence,  mouth clear,  oropharynx clear, moderate  postnasal drip  Neck: No JVD, no TMG, no carotid bruits  Lungs: No use of accessory muscles, no dullness to percussion, distant BS   Cardiovascular: RRR, heart sounds normal, no murmur or gallops, no peripheral edema  Abdomen: soft and NT, no HSM,  BS normal  Musculoskeletal: No deformities, no cyanosis or clubbing  Neuro: alert, non focal  Skin: Warm, no lesions or rashes         Assessment & Plan:   Allergic rhinitis, seasonal Flare of allergic rhinitis   Plan Generic allegra 180mg /d Increase nasonex to two puff bid Pulse prednisone   COPD Copd, mild exacerbation d/t allergic rhinitis flare with postnasal drip syndrome Plan Pulse prednisone 10mg  Take 4 for two days, three for two days, two for two days, then one for two days and stop Double nasonex to two puff bid Start fexofenidine daily    Updated Medication List Outpatient Encounter Prescriptions as of 08/09/2010  Medication Sig Dispense Refill  . aspirin 325 MG tablet Take 325 mg by mouth daily.        . diazepam (VALIUM) 5 MG tablet Take 5 mg by mouth at bedtime as needed.        Marland Kitchen losartan (COZAAR) 100 MG tablet Take 1 tablet (100 mg total) by mouth daily.  90 tablet  3  . mometasone (NASONEX) 50  MCG/ACT nasal spray 2 sprays by Nasal route 2 (two) times daily.  17 g  11  . nitroGLYCERIN (NITROSTAT) 0.4 MG SL tablet Place 0.4 mg under the tongue every 5 (five) minutes as needed.        Marland Kitchen omeprazole (PRILOSEC) 20 MG capsule Take 1 capsule (20 mg total) by mouth daily.  90 capsule  3  . Polyethylene Glycol 1000 POWD 1,000 mg by Does not apply route daily.        Marland Kitchen tiotropium (SPIRIVA) 18 MCG inhalation capsule Place 18 mcg into inhaler and inhale daily.        Marland Kitchen  DISCONTD: mometasone (NASONEX) 50 MCG/ACT nasal spray 2 sprays by Nasal route daily.        . fexofenadine (ALLEGRA) 180 MG tablet Take 1 tablet (180 mg total) by mouth daily.  30 tablet  11  . meloxicam (MOBIC) 7.5 MG tablet Take 7.5 mg by mouth daily.        . predniSONE (DELTASONE) 10 MG tablet Take 4 for two days, three for two days, two for two days, then one for two days and stop   20 tablet  0

## 2010-08-09 NOTE — Assessment & Plan Note (Signed)
Copd, mild exacerbation d/t allergic rhinitis flare with postnasal drip syndrome Plan Pulse prednisone 10mg  Take 4 for two days, three for two days, two for two days, then one for two days and stop Double nasonex to two puff bid Start fexofenidine daily

## 2010-08-09 NOTE — Patient Instructions (Signed)
Take prednisone 10mg  Take 4 for two days, three for two days, two for two days, then one for two days and stop Start Allegra one daily  (generic fexofenidene) Increase nasonex two puff twice daily Stay on spiriva Return 2 months

## 2010-08-09 NOTE — Assessment & Plan Note (Signed)
Flare of allergic rhinitis   Plan Generic allegra 180mg /d Increase nasonex to two puff bid Pulse prednisone

## 2010-08-13 ENCOUNTER — Other Ambulatory Visit: Payer: Self-pay | Admitting: Internal Medicine

## 2010-08-14 ENCOUNTER — Telehealth: Payer: Self-pay | Admitting: Critical Care Medicine

## 2010-08-14 NOTE — Telephone Encounter (Signed)
Spoke with pt and she states she started prednisone on Saturday and every since she has been wired. Pt is not able to sleep at all and she feels exhausted. Pt is currently down to taking 2 x2 days. Pt is wanting Dr. Lynelle Doctor rec's on what to do. Pt states she doesn't think she continue on like this. Please advise Dr. Delford Field. Thanks  Allergies  Allergen Reactions  . Penicillins     REACTION: hives  . Shrimp Flavor     REACTION: burning, itching, throat swelling  . Sulfamethoxazole     REACTION: Nausea/vomiting    Carver Fila, CMA

## 2010-08-14 NOTE — Telephone Encounter (Signed)
Stop prednisone now

## 2010-08-14 NOTE — Telephone Encounter (Signed)
Pt advised to stop prednisone and she verbalized Chad

## 2010-08-21 ENCOUNTER — Telehealth: Payer: Self-pay | Admitting: Internal Medicine

## 2010-08-21 NOTE — Telephone Encounter (Signed)
Pt needs refill diazepam call into [facility] [address] [phone]

## 2010-08-22 NOTE — Telephone Encounter (Signed)
rx called into pharmacy

## 2010-09-02 ENCOUNTER — Telehealth: Payer: Self-pay | Admitting: Critical Care Medicine

## 2010-09-02 MED ORDER — TIOTROPIUM BROMIDE MONOHYDRATE 18 MCG IN CAPS: 18.0000 ug | ORAL_CAPSULE | Freq: Every day | RESPIRATORY_TRACT | Status: DC

## 2010-09-02 NOTE — Telephone Encounter (Signed)
Advised pt rx was sent to pharmacy and nothing further was needed

## 2010-09-04 ENCOUNTER — Other Ambulatory Visit: Payer: Self-pay | Admitting: *Deleted

## 2010-09-04 MED ORDER — TIOTROPIUM BROMIDE MONOHYDRATE 18 MCG IN CAPS: 18.0000 ug | ORAL_CAPSULE | Freq: Every day | RESPIRATORY_TRACT | Status: DC

## 2010-09-04 NOTE — Telephone Encounter (Signed)
Fax received from [facility] stating that the pt no longer has prescription benefits through them and that the prescription needs to go to [facility]. I called and spoke with the patient and she confirmed that the prescription should go to [facility]. I will resned spiriva to [facility].

## 2010-09-11 ENCOUNTER — Other Ambulatory Visit: Payer: Self-pay | Admitting: Internal Medicine

## 2010-09-18 ENCOUNTER — Encounter (INDEPENDENT_AMBULATORY_CARE_PROVIDER_SITE_OTHER): Payer: Self-pay | Admitting: Surgery

## 2010-09-18 DIAGNOSIS — Z803 Family history of malignant neoplasm of breast: Secondary | ICD-10-CM

## 2010-09-18 DIAGNOSIS — H9190 Unspecified hearing loss, unspecified ear: Secondary | ICD-10-CM | POA: Insufficient documentation

## 2010-09-18 DIAGNOSIS — R32 Unspecified urinary incontinence: Secondary | ICD-10-CM

## 2010-09-18 DIAGNOSIS — J349 Unspecified disorder of nose and nasal sinuses: Secondary | ICD-10-CM | POA: Insufficient documentation

## 2010-09-18 DIAGNOSIS — J029 Acute pharyngitis, unspecified: Secondary | ICD-10-CM

## 2010-09-19 ENCOUNTER — Encounter: Payer: Self-pay | Admitting: Cardiology

## 2010-09-20 ENCOUNTER — Encounter: Payer: Self-pay | Admitting: Cardiology

## 2010-09-20 ENCOUNTER — Ambulatory Visit (INDEPENDENT_AMBULATORY_CARE_PROVIDER_SITE_OTHER): Payer: Medicare Other | Admitting: Cardiology

## 2010-09-20 DIAGNOSIS — I2510 Atherosclerotic heart disease of native coronary artery without angina pectoris: Secondary | ICD-10-CM

## 2010-09-20 DIAGNOSIS — Z8679 Personal history of other diseases of the circulatory system: Secondary | ICD-10-CM

## 2010-09-20 NOTE — Patient Instructions (Signed)
Your physician recommends that you schedule a follow-up appointment in: 1 year with Dr. Wall  

## 2010-09-20 NOTE — Assessment & Plan Note (Signed)
Stable, no change in treatment.

## 2010-09-20 NOTE — Progress Notes (Signed)
   Patient ID: [name] [name], female    DOB: 11/19/1923, 77 y.o.   MRN: [id number]  HPI  [name] [name] returns for E and M of her nonobtructive CAD, MVP, and history of palpitations. Her biggest complaint today are nocturnal leg cramps. She wears O2 at night and does not get around walking like she used to. She took a bad fall in October which she has not recovered from. Her potassium was 5 in 2/12. In addition, she had a life threatening pneumonia last summer in [place] while visiting her son. She finally got off of O2 in the fall. She is followed closely by Dr Delford Field.  She denies angina or palpitations.   EKG today shows NSR with LAE and low voltage, no change.   Review of Systems  Constitutional: Positive for activity change and fatigue.  Respiratory: Positive for shortness of breath.   Cardiovascular: Negative for chest pain and palpitations.  Musculoskeletal: Positive for arthralgias and gait problem.  Neurological: Positive for weakness.  All other systems reviewed and are negative.      Physical Exam  Nursing note and vitals reviewed. Constitutional: She is oriented to person, place, and time. She appears well-developed and well-nourished. No distress.  HENT:  Head: Normocephalic and atraumatic.  Eyes: EOM are normal. Pupils are equal, round, and reactive to light.  Neck: Neck supple. No JVD present. No tracheal deviation present. No thyromegaly present.  Cardiovascular: Normal rate, regular rhythm, S1 normal, S2 normal, normal heart sounds and normal pulses.   No extrasystoles are present. PMI is not displaced.   No murmur heard. Pulmonary/Chest: Effort normal.       Decreased breath sounds throughout  Abdominal: Soft. Bowel sounds are normal.  Musculoskeletal:       Decreased ROM  Neurological: She is alert and oriented to person, place, and time.  Skin: Skin is warm and dry.  Psychiatric: She has a normal mood and affect.

## 2010-09-20 NOTE — Assessment & Plan Note (Signed)
Stable, no change in management 

## 2010-09-24 NOTE — Assessment & Plan Note (Signed)
[facility]                            CARDIOLOGY OFFICE NOTE   NAME:[name], [name] GAUER                         MRN:          [id number]  DATE:05/31/2008                            DOB:          1923/08/03    HISTORY OF PRESENT ILLNESS:  [name] [name] comes in today.   The day before Christmas, she was out shoveling snow in very cold  weather.  She is 73!   She developed substernal chest pressure and [name] [name] left arm felt heavy.  She refused to go to the hospital or call 911.   She went to Dr. Cato Mulligan' office.  He admitted [name] [name] to [facility].   During [name] [name] admission, Dr. Willa Rough from our service saw [name] [name].  She  had no acute EKG changes.  She had had no previous history of coronary  artery disease.  [name] [name] cardiac enzymes were negative and was arranged for  [name] [name] to have a 2-D echocardiogram to assess [name] [name] LV function as well as a  stress adenosine Myoview.  The adenosine Myoview on May 24, 2008  showed EF that could not be gated because of frequent PACs, but she had  no sinus scar or ischemia.  [name] [name] 2-D echocardiogram was essentially  normal with no regional wall motion abnormalities.  She had mild LVH.  [name] [name] aortic valve was mildly thickened and she had mild mitral valve  regurgitation.   She has had no further events.   She has a history of mild mitral valve prolapse that I used to follow  for years ago.   CURRENT MEDICATIONS:  Include  1. Protonix 40 mg a day.  2. Singulair 10 mg per day.  3. Actonel 30 mg per week.  4. MiraLax.  5. O2 at night.  6. Benicar 20 mg per day.  7. Aspirin 81 mg a day.  8. Veramyst bilaterally, spray.  9. Aspirin 325 mg per day.   She is not carrying sublingual nitroglycerin.   PHYSICAL EXAMINATION:  GENERAL:  She is very pleasant.  She looks  younger than stated age.  VITAL SIGNS:  [name] [name] blood pressure is 120/82.  [name] [name] pulse is 80 and  regular.  Weight is 134.  HEENT:  Normal.  NECK:  Carotid upstrokes are equal  bilaterally without bruits, no JVD.  Thyroid is not enlarged.  Trachea is midline.  LUNGS:  Clear to auscultation and percussion.  HEART:  Reveals regular rate and rhythm.  No gallop.  PMI is  nondisplaced.  No click was audible.  ABDOMEN:  Soft, good bowel sounds.  No midline bruit.  No pulsatile  mass.  EXTREMITIES:  No cyanosis, clubbing, or edema.  Pulses are present.  She  has some dependent rubor and [name] [name] toes are cool with decreased capillary  refill.  NEUROLOGIC:  Grossly intact.   [name] [name] EKG shows normal sinus rhythm with some small, insignificant Qs  inferiorly.  These have been noted in the past when I saw [name] [name] remotely.   ASSESSMENT AND PLAN:  I suspect [name] [name] did have some coronary  vasospasm with [name] [name] typical symptoms.  She probably has some mild  underlying coronary artery disease, but certainly not obstructive  disease based on [name] [name] Myoview.  She did not have an acute myocardial  injury.   I have recommended sublingual nitroglycerin p.r.n..  It is of note that  EMS at [facility]' office gave [name] [name] (nitro, which relieved [name] [name] pain).   She will continue with other medications.  She has been instructed on  the use of sublingual nitroglycerin.  If she has an episode again that  does not respond to 2 or 3 sublingual nitroglycerin she has been advised  to call 911.  I will plan on seeing [name] [name] back in a year.     Thomas C. Daleen Squibb, MD, [facility]  Electronically Signed    TCW/MedQ  DD: 05/31/2008  DT: 06/01/2008  Job #: [id number]

## 2010-09-24 NOTE — Assessment & Plan Note (Signed)
[facility]                             PULMONARY OFFICE NOTE   NAME:Boakye, SHATON LORE                         MRN:          [id number]  DATE:01/27/2007                            DOB:          Feb 27, 1924    Ms. Cozad returns in followup and is an 77 year old white female with a  history of chronic obstructive lung disease, asthmatic bronchitis,  obstructive sleep apnea, allergic rhinitis, reflux disease.  She is  having some hoarseness, minimal reflux symptoms, but she is using  Protonix at bedtime, instead of before meals.  She maintains this 40 mg  nightly, Singulair 10 mg daily, Actonel 35 mg weekly, oxygen 2 liters  nightly, Benicar 20 mg daily, aspirin 325 mg daily, Veramyst 2 sprays  each nostril daily.   PHYSICAL EXAMINATION:  Temp 97, blood pressure 108/70, pulse 53,  saturation 98% on room air.  CHEST:  Clear without evidence of wheeze or rhonchi.  CARDIAC:  Regular rate and rhythm without S3.  Normal S1 and S2.  ABDOMEN:  Soft and nontender.  EXTREMITIES:  No clubbing or edema.  SKIN:  Clear.   IMPRESSION:  Chronic obstructive lung disease with obstructive sleep  apnea, allergic rhinitis.  Reflux disease.   Patient was instructed to utilize her Protonix before meals, otherwise  no change in plan of care was made.  We will see the patient back in  followup.     Charlcie Cradle Delford Field, MD, [facility]  Electronically Signed    PEW/MedQ  DD: 01/27/2007  DT: 01/27/2007  Job #: [id number]

## 2010-09-24 NOTE — Op Note (Signed)
NAMELAMYAH, CREED                  ACCOUNT NO.:  [id number]   MEDICAL RECORD NO.:  [id number]          PATIENT TYPE:  AMB   LOCATION:  SDS                          FACILITY:  [facility]   PHYSICIAN:  Sandria Bales. Ezzard Standing, M.D.  DATE OF BIRTH:  [birth year]   DATE OF PROCEDURE:  07/21/2007  DATE OF DISCHARGE:                               OPERATIVE REPORT   Why can't surgery date be placed here?   PREOPERATIVE DIAGNOSIS:  Carcinoma of the right breast at the 11 o'clock  position.   POSTOPERATIVE DIAGNOSIS:  Carcinoma of the right breast at the 11  o'clock position, negative sentinel lymph node biopsy.   PROCEDURE:  Needle localization right breast lumpectomy, injection of  methylene blue, and right axillary sentinel lymph node biopsy.   SURGEON:  Sandria Bales. Ezzard Standing, M.D.   FIRST ASSISTANT:  None.   ANESTHESIA:  MAC anesthesia, approximately 67 mL of 0.5% Xylocaine  plain.   COMPLICATIONS:  None.   PROCEDURE IN DETAIL:  Ms. Beneke is an 77 year old white female patient  of Dr. Birdie Sons who has a biopsy proven right breast carcinoma.  A  discussion was carried out with her about the options for treatment  which include mastectomy versus lumpectomy and the need for sentinel  lymph node biopsy.  Ms. Waguespack has a peculiar history in that she has had  a history of airway problems involving severe hypopharyngeal stenosis.  For this reason, a lengthy discussion was carried out about the  indications and risks of surgery.  For her, the risks of airway  management exceed the risk of the surgery.   She wants to undergo a lumpectomy under MAC anesthesia and a sentinel  lymph node biopsy.  She understands she may need general anesthesia and  may require a tracheostomy just for airway management.  The tumor,  itself, the risks of bleeding, infection, nerve injury, and recurrence  of tumor.   OPERATIVE NOTE:  The patient was placed in a supine position.  She had a  wire placed in the right [facility]  [facility]. She had an  injection of 1 millicurie of technetium sulphur colloid in a  circumareolar fashion in her right breast.  Her right breast was prepped  and draped with Betadine solution.  The right breast had been marked  before surgery and a time out was held identifying the operation and the  patient.  I injected about 3 mL of 40% methylene blue mixed with 0.25%  Marcaine.  We decided to use MAC anesthesia and then I used 0.25%  Marcaine plain as a local anesthetic.   Attention was turned first to the right axilla.  She never really found  much hot in the right axilla.  There was one area that I found which was  very mildly warm, maybe counts of 4-6 with a background of 0, and I went  on and took out the lower axillary fat pad.  I found that one area that  had a warm node and marked another area that was pale blue, but there  was  no convincing sentinel lymph node in a normal fashion.  I thought  that I got adequate specimens for evaluation.   In speaking with Dr. Baruch Merl, the pathologist, he did find a lymph  node node near the radioactive counts area, so I think this was a  sentinel lymph node.  That node was negative.  Hemostasis was with Bovie  electrocautery and clips and 3-0 Vicryl ties.   I then turn my attention to the breast. The wire came out at the 11  o'clock position, the right breast was angled along the lateral aspect  of the breast, so I took out a block of breast tissue approximately 3-4  cm diameter about 5-6 cm in length and sent this for specimen  mammography.  I marked a long suture in the medial aspect, a short  suture cranial.  In orienting this for Dr. Rosalie Gums, first the clip  that had been left in the breast did not represent where the tumor was,  it may have migrated or moved some, and she was worried about the deep  and lateral margins being close to what she saw on specimen mammography.  I then took back another section of breast  tissue, probably 5 cm in  length, about 1 cm of thickness, which was the lateral and deep margin  of the orginal biopsy cavity.  This was labeled with a long suture which  abutted the old breast specimen, a short suture medial, which  goes  under the nippple, and then a suture with clips which is cranial.   I thought this completed the breast dissection.  There was no obvious  tumor that I left behind.  I irrigated the wounds out.  I closed the  wounds with 3-0 Vicryl suture and the skin with 5-0 Vicryl suture,  painted each with tincture of Benzoin and Steri-Strips and put an Ace  bandage on her for a binder.  Sponge and needle count were correct at  the end of the case.  There were no airway issues during the operation.   The patient tolerated the procedure well and was transported to the  recovery room in good condition.      Sandria Bales. Ezzard Standing, M.D.  Electronically Signed     DHN/MEDQ  D:  07/21/2007  T:  07/22/2007  Job:  [id number]   cc:   Valetta Mole. Swords, MD  Gloris Manchester. Lazarus Salines, M.D.  Leatha Gilding. Mezer, M.D.

## 2010-09-24 NOTE — Assessment & Plan Note (Signed)
[facility]                             PULMONARY OFFICE NOTE   NAME:[name], [name] KEN                         MRN:          [id number]  DATE:11/18/2006                            DOB:          1923/05/24    [name] [name] is an 77 year old white female.   HISTORY:  1. Chronic obstructive lung disease, asthmatic bronchitic component.  2. 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  3. Reflux disease.  4. Obstructive sleep apnea   Overall her level of dyspnea and cough are at baseline.  She is having  no active symptoms on a respiratory standpoint except for minimal  postnasal drip.  She is not tolerating her bilevel therapy, uses oxygen  at bedtime.  Her symptoms have not worsened since stopping Symbicort.  She is on the Veramyst and Singulair daily.  Protonix is maintained also  at 40 mg daily.   EXAMINATION:  Temperature 97, blood pressure 120/72, pulse 60,  saturation 97% room air.  CHEST:  Showed to be clear today without evidence of adventitious breath  sounds.  CARDIAC:  Showed a regular rate and rhythm, without S3, normal S1-S2.  ABDOMEN:  Soft, nontender.  EXTREMITIES:  Showed no edema or clubbing.  SKIN:  Clear.   IMPRESSION:  1. Postnasal drip syndrome.  2. Stable vocal cord dysfunction.  3. No evidence of active mycobacterial infection.  4. Stable airway function.   PLAN:  The patient maintain off Symbicort and maintain Veramyst and  Singulair as is and we will see the patient back in return followup in 4-  5 months.     Charlcie Cradle Delford Field, MD, [facility]  Electronically Signed    PEW/MedQ  DD: 11/19/2006  DT: 11/19/2006  Job #: [id number]   cc:   Valetta Mole. Swords, MD

## 2010-09-24 NOTE — Consult Note (Signed)
NAME:  [name] [name], [name] [name] NO.:  [id number]   MEDICAL RECORD NO.:  [id number]          PATIENT TYPE:  INP   LOCATION:  4706                         FACILITY:  [facility]   PHYSICIAN:  Luis Abed, MD, FACCDATE OF BIRTH:  11/05/1923   DATE OF CONSULTATION:  DATE OF DISCHARGE:                                 CONSULTATION   [name] [name] is a delightful, active, spry, 77 year old female.  She is a  friend of Dr. Daleen Squibb and has seen him as a patient many years ago.  She  follows carefully with Dr. Cato Mulligan and with Dr. Danise Mina of our  pulmonary division.  The patient has a history of atrial fib.  She has a  history of GERD.  She also has chronic tracheal stenosis and COPD.  She  has had some increasing shortness of breath.  However, she remains  active.  Recently, she went outside to shovel in the snow.  She became  very short of breath and had some chest heaviness.  She was quite  concerned.  She was encouraged to go to the emergency room but insisted  on seeing Dr. Cato Mulligan in the office which she did.  She was stable and he  admitted her to the hospital.  Since being here, there has been no  definite evidence of a myocardial infarction.   PAST MEDICAL HISTORY:   ALLERGIES:  1. PENICILLIN.  2. SULFA.   MEDICATIONS:  1. Aspirin.  2. Benicar.  3. Protonix.  4. Valium p.r.n.  5. Tylenol.  6. Benadryl p.r.n.  7. Astelin spray.   OTHER MEDICAL PROBLEMS:  See the complete list below.   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  She  lives in [place].  She is widowed.  She stopped smoking 20 years ago.  She is very active.   FAMILY HISTORY:  There is no strong family history of coronary disease.   REVIEW OF SYSTEMS:  She has had some sweating episodes.  She has had  chest pain and shortness of breath as described in the HPI.  She has had  some increasing fatigue over time and shortness of breath.  She is not  having any GI or GU symptoms.   However, she might have some reflux  symptoms.  Otherwise, her review of systems is negative.   PHYSICAL EXAM:  Blood pressure is 150/79.  Her pulse is 59.  Respiratory  rate is 20.  Temperature is 97.  O2 sat is 96% on 2 L.  GENERAL:  She is oriented to person, time, and place.  Her affect is  normal.  She is pleasantly talkative.  HEENT:  Reveals no xanthelasma.  She has normal extraocular motion.  There are no carotid bruits.  There is no jugular venous distention.  LUNGS:  Reveal some scattered wheezes.  She does not have any rales.  CARDIAC EXAM:  Reveals a soft systolic murmur.  ABDOMEN:  Soft.  She has no significant peripheral edema.  NEUROLOGIC:  Grossly intact.   The EKG at this time  shows small Q-waves.  These are seen in the  inferior leads.  She does have sinus rhythm.  BUN is 6 and creatinine  1.0.  Potassium 4.6.  Hemoglobin is 14.4.  Chest x-ray reveals some mild  cardiac enlargement and some mild chronic interstitial changes.  Troponins are 0.01, 0.01, at 0.01.   PROBLEMS:  Include:  1. History of right-sided breast cancer in the past.  2. GERD.  3. COPD.  4. History of diverticulosis.  5. History of fatty liver.  6. Hypertension.  7. Chronic tracheal stenosis.  8. ALLERGY TO PENICILLIN AND SULFA.  9. Recent shortness of breath and some chest heaviness after shoveling      some snow.   At this point, there is no proof of an acute coronary syndrome.  Her  enzymes are negative.  Her troponins are normal.  Her EKG shows no  significant change.  The patient is stable.  She will be discharged  home.  We will proceed with a followup nuclear exercise test to rule out  ischemia and an echo to reassess her LV function since she has very  small inferior Q-waves.  Her medicines will not be changed.  She will  have a followup visit with Dr. Juanito Doom.      Luis Abed, MD, [facility]  Electronically Signed     JDK/MEDQ  D:  05/04/2008  T:  05/04/2008  Job:   [id number]   cc:   Thomas C. Wall, MD, FACC  Bruce H. Swords, MD

## 2010-09-26 ENCOUNTER — Telehealth: Payer: Self-pay | Admitting: Critical Care Medicine

## 2010-09-26 NOTE — Telephone Encounter (Signed)
Called and spoke with pt and she is aware that this test is necessary for her to requalify for the oxygen.  Pt voiced her understanding of this and will do the test since PW ordered her to have it done.  Pt is aware that [facility] will explain how the test is going to be done to her when they get there.

## 2010-09-27 NOTE — Assessment & Plan Note (Signed)
[facility]                             PULMONARY OFFICE NOTE   NAME:[name] [name], [name] [name]                         MRN:          [id number]  DATE:05/08/2006                            DOB:          1923-10-12    [name] [name] is an 77 year old white female with a history of chronic  obstructive lung disease, asthmatic bronchitis.  She notes her level of  dyspnea is worse.  She has been off all inhaled medicines for over a  year.  She states that being off, her dyspnea is worsening.  She has no  chest pain.  She used to be on Asmanex and also used to be on Foradil.  She does maintain Veramyst 1 spray each nostril daily, Benicar 20 mg  daily, oxygen 2 liters nightly, MiraLax daily, Actonel weekly, Singulair  10 mg daily, Protonix 40 mg daily, aspirin 81 mg daily, Aleve 1 b.i.d.   PHYSICAL EXAMINATION:  VITAL SIGNS:  Temp 98, blood pressure 120/80,  pulse 66, saturation 99% on room air.  CHEST:  Distant breath sounds without evidence of wheeze or rhonchi.  CARDIAC:  Regular rate and rhythm without S3.  Normal S1 and S2.  ABDOMEN:  Soft and nontender.  EXTREMITIES:  No edema or clubbing.  SKIN:  Clear.   IMPRESSION:  Likely progressive primary emphysema in a patient with  progressive dyspnea.   PLAN:  Repeat full set of pulmonary function studies in the interim,  begin Spiriva one capsule daily.  Will see the patient back in followup.     Charlcie Cradle Delford Field, MD, [facility]  Electronically Signed    PEW/MedQ  DD: 05/08/2006  DT: 05/08/2006  Job #: [id number]   cc:   Valetta Mole. Swords, MD

## 2010-09-27 NOTE — Procedures (Signed)
NAME:  [name] [name], [name] [name] NO.:  [id number]   MEDICAL RECORD NO.:  [id number]          PATIENT TYPE:  OUT   LOCATION:  SLEEP CENTER                 FACILITY:  [facility]   PHYSICIAN:  Marcelyn Bruins, M.D. [facility] DATE OF BIRTH:  August 11, 1923   DATE OF STUDY:  12/04/2005                              NOCTURNAL POLYSOMNOGRAM    REFERRING PHYSICIAN:  Dr. Marcelyn Bruins   INDICATIONS FOR THE STUDY:  Hypersomnia with sleep apnea.   EPWORTH SCORE:  3.   SLEEP ARCHITECTURE:  The patient had total sleep time of 135 minutes with no  stage 3/4 sleep or REM.  Sleep onset latency was prolonged at 97 minutes and  sleep efficiency was very decreased at 31%.  The patient was very restless  during the night secondary to joint pain.   REPIRATORY DATA:  The patient was found to have two hypopneas and 20 apneas  for a respiratory disturbance index of 10 events per hour.  Events were not  positional and loud snoring was noted throughout.   OXYGEN DATA:  The patient's sleep study was done on 2 liters nasal cannula  and her lowest O2 saturation throughout the night was 95%.   CARDIAC DATA:  No clinically significant cardiac arrhythmias.   MOVEMENT/PARASOMNIAS:  The patient had no abnormal movements or behaviors  noted.   IMPRESSION/RECOMMENDATION:  1.  Mild obstructive sleep apnea/hypopnea syndrome with a respiratory      disturbance index of 10 events per hour and O2 saturation as low as 95%      on 2 liters.  Treatment for this degree of sleep apnea may include      weight loss alone if applicable, upper airway surgery, oral appliance,      and finally CPAP.           ______________________________  Marcelyn Bruins, M.D. [facility]  Diplomate, American Board of Sleep  Medicine     KC/MEDQ  D:  12/12/2005 16:51:14  T:  12/12/2005 22:56:49  Job:  [id number]

## 2010-09-27 NOTE — Assessment & Plan Note (Signed)
[facility]                               PULMONARY OFFICE NOTE   NAME:[name], [name]                         MRN:          [id number]  DATE:03/09/2006                            DOB:          1923/06/08    Ms. 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  Overall the patient's level of chest tightness is the same. She is still  having some dyspnea. She had an adenosine Cardiolite that was normal. The  patient maintains:  1. Benicar 20 mg daily.  2. Oxygen 2 liters h.s.  3. Actonel weekly.  4. Singulair 10 mg daily.  5. Protonix 40 mg daily.  6. Aspirin 325 mg daily.   PHYSICAL EXAMINATION:  VITAL SIGNS:  Temperature 98, blood pressure 140/80,  pulse 60, saturations 96% on room air.  CHEST:  Distant breath sounds with prolonged respiratory phase, no wheeze or  rhonchi.  CARDIAC:  Regular rate and rhythm without S3, normal S1, S2.  ABDOMEN:  Soft, nontender.  EXTREMITIES:  Showed no edema or clubbing.  SKIN:  Clear.  NEUROLOGIC:  Intact.   IMPRESSION:  1. Asthmatic bronchitis with chronic lung disease stable at this time.  2. Allergic rhinitis.   PLAN:  Begin Veramyst 2 sprays each nostril daily, maintain saline lavage  via nasal passages. Maintain Singulair daily and will see the patient back  in return followup in 3 months.     Charlcie Cradle Delford Field, MD, [facility]    PEW/MedQ  DD: 03/09/2006  DT: 03/10/2006  Job #: [id number]   cc:   Valetta Mole. Swords, MD

## 2010-09-27 NOTE — Assessment & Plan Note (Signed)
[facility]                             PULMONARY OFFICE NOTE   NAME:[name] [name] CRESWELL                         MRN:          [id number]  DATE:06/01/2006                            DOB:          07-12-1923    HISTORY OF PRESENT ILLNESS:  The patient is an 77 year old white female  patient of Dr. Lynelle Doctor with a known history of COPD with an asthmatic  bronchitic component.  The patient returns today for followup.  The  patient was recently started on Spiriva three weeks ago.  The patient  does report that she has only taken the medicine a total of three times.  After her last dose, the patient complains that she felt tingling in her  arms and legs bilaterally and thought that her mouth was extremely thick  and coated.  The patient had had a similar feeling of the thickness of  the mouth with a sour taste in it after the first time she used it;  however, with rinsing, this got better.  The patient does report that  she did not rinse her mouth after the third dose.  The patient did stop  he medication two days ago and has had no further episodes of tingling,  night sweats, or distaste in her mouth.  The patient denies any chest  pain at rest, radiating pains, or exertional chest pain.  The patient  denies any nausea, vomiting, reflux symptoms, slurred speech, visual  changes, or overt reflux symptoms.  The patient does report that she has  recently had a stress Cardiolite that she reports was unremarkable.  Unfortunately, I do not have the results of this test.   PAST MEDICAL HISTORY:  Reviewed.   CURRENT MEDICATIONS:  Reviewed.   PHYSICAL EXAMINATION:  GENERAL:  The patient is a pleasant female in no  acute distress.  VITAL SIGNS:  She is afebrile with stable vital signs.  O2 saturation is  96% on room air.  HEENT:  Posterior pharynx is clear.  Tongue is normal.  NECK:  Supple without adenopathy.  LUNGS:  Lung sounds reveal diminished breath sounds in  the bases.  CARDIAC:  Regular rate and rhythm.  ABDOMEN:  Soft and benign.  EXTREMITIES:  Warm without any calf tenderness, cyanosis, clubbing, or  edema.   IMPRESSION AND PLAN:  1. Questionable adverse medication effect of Spiriva with subjective      paresthesias.  The patient has had no further symptoms since      discontinuation of Spiriva.  Will not restart this medication at      this time.  The patient is to report if symptoms return and to      contact us immediately or go to the closest emergency department.  2. Chronic obstructive pulmonary disease and asthmatic bronchitis with      recent increase in level of dyspnea with activity.  Have suggested      that we give a trial of Symbicort 80/4.5, two puffs twice daily.      The patient did take her first dose today in the office  and      demonstrated correct use of her MDI.  The patient will return here      in two weeks with Dr. Delford Field or sooner if needed.   DIAGNOSTIC DATA:  EKG done today in the office shows a sinus bradycardia  with a first-degree AV block which appears to be unchanged from previous  EKG comparison in August of 2002.  No acute changes were noted.      Rubye Oaks, NP  Electronically Signed      Charlcie Cradle Delford Field, MD, [facility]  Electronically Signed   TP/MedQ  DD: 06/02/2006  DT: 06/02/2006  Job #: [id number]

## 2010-09-27 NOTE — Assessment & Plan Note (Signed)
[facility]                             PULMONARY OFFICE NOTE   NAME:Smits, MILIA WARTH                         MRN:          [id number]  DATE:06/24/2006                            DOB:          1923-05-20    Ms. Matsuoka returns today in followup. She is an 77 year old white female  here today for followup for chronic obstructive lung disease, asthmatic  bronchitis, underlying history of mycobacterium avium intracellulare  infection. Her biggest complaint is that of continued weakness,  shortness of breath and she states that her throat becomes irritated  while using the Symbicort. She was seen by the nurse practitioner on  June 01, 2006, who stopped her Spiriva and placed her on the  Symbicort 80/4.5 at two sprays daily. She was utilizing the Spiriva and  it caused tingling sensations in the arms and legs and the mouth was  feeling thick. She also had night sweats. She continues to not sleep  well. Her hoarseness is still persistent. She is using the Symbicort  straight off the direct inhaler.   Other medicines include:  1. Protonix 40 mg daily.  2. Singulair 10 mg daily.  3. Actonel weekly.  4. Oxygen 2 liters at h.s.  5. Benicar 20 mg daily.  6. Veramyst one spray each nostril daily.   PHYSICAL EXAMINATION:  Temperature 97.0, blood pressure 108/76, pulse  60, saturation was 95% on room air.  CHEST: Showed distant breath sounds with prolonged expiratory phase. No  wheeze or rhonchi.  CARDIAC: Showed a regular rate and rhythm without S3. Normal S1, S2.  ABDOMEN: Soft, nontender.  EXTREMITIES: Showed no edema or clubbing.  SKIN: Was clear.  NEUROLOGIC: Was intact.  HEENT: Showed nares to be clear. Oropharynx was clear and supple.   IMPRESSION:  Is that of asthmatic bronchitis, history of mycobacterium  avium intracellulare infection. The patient is clearing. Whether there  could be recurrence in this area, upper airway irritability with  utilization of the Symbicort inhaler.   PLAN:  Is to provide for the patient an [employer]. She will utilize  this with two sprays daily. We will also obtain a CT scan of the chest  to evaluate for interval change in the mycobacterium  avium complex process in her chest. Otherwise, will see the patient back  in followup in two months.     Charlcie Cradle Delford Field, MD, [facility]  Electronically Signed    PEW/MedQ  DD: 06/24/2006  DT: 06/24/2006  Job #: [id number]   cc:   Valetta Mole. Swords, MD

## 2010-09-27 NOTE — Assessment & Plan Note (Signed)
[facility]                             PULMONARY OFFICE NOTE   NAME:[name], [name] DIGIULIO                         MRN:          [id number]  DATE:07/28/2006                            DOB:          February 23, 1924    [name] [name] returns today in follow-up, an 77 year old white female,  history of chronic obstructive lung disease, asthmatic bronchitic  component, allergic rhinitis, obstructive sleep apnea, reflux disease.  She notes that the Symbicort is doing well for her.  She is tolerating  it well at 80/4.5 at 2 sprays daily.  She is on the Veramyst one spray  daily, oxygen 2 L h.s., Singulair 10 mg daily, Protonix 40 mg daily.   PHYSICAL EXAMINATION:  VITAL SIGNS:  Temperature 98, blood pressure  136/80, pulse 78, saturation 97% in room air.  CHEST:  Distant breath sounds with a prolonged expiratory phase.  No  wheeze or rhonchi were noted.  CARDIAC:  A regular rate and rhythm without S3, normal S1, S2.  ABDOMEN:  Soft, nontender.  EXTREMITIES:  No edema or clubbing.   Recent CT scan of the chest was reviewed and revealed no changes  compatible with COPD, scarring at the apices compatible with previous  Mycobacterium infection but no active infection seen.   IMPRESSION:  Quiescent Mycobacterium avium intracellulare infection with  stable chronic obstructive pulmonary disease.   PLAN:  Maintain inhaled medicines as currently dosed, and we will see  this patient back in return follow-up in 2 months.     [name] Cradle Delford Field, MD, [facility]  Electronically Signed    PEW/MedQ  DD: 07/28/2006  DT: 07/29/2006  Job #: [id number]

## 2010-09-27 NOTE — Assessment & Plan Note (Signed)
[name]                             PULMONARY OFFICE NOTE   NAME:[name] [name], [name] [name]                         MRN:          [id number]  DATE:08/12/2006                            DOB:          [birth year]    [name] is an 77 year old white female seen here in followup who was  just seen in on the 18th of March. She continues to complain of chest  tightness, pain in the lower back, dyspnea. She is not able to tolerate  Symbicort after two puffs of the 80/4.5 daily. She has increased  tachycardia.   She is maintaining Veramyst one spray each nostril daily, Benicar 20 mg  daily, aspirin 81 mg daily, oxygen 2.5 liters at h.s., Singulair 10 mg  daily, Protonix 40 mg daily.   PHYSICAL EXAMINATION:  Temperature 98.6, blood pressure 162/84, pulse  63, saturation is 95% on room air.  CHEST: Showed distant breath sounds with prolonged expiratory phase. No  wheeze or rhonchi were noted.  CARDIAC: Showed a regular rate and rhythm without S3. Normal S1, S2.  ABDOMEN: Soft, nontender.  EXTREMITIES: Showed no edema or clubbing.  SKIN: Was clear.  NEUROLOGIC: Was intact.  HEENT: Showed no jugular venous distention. No lymphadenopathy.  Oropharynx was clear.  NECK: Supple.   Chest x-ray was obtained today and showed no active disease process.   IMPRESSION:  No real overt physical findings consistent with a patient's  symptom complex. We will obtain a CMET, BNP, CBC, set of cardiac enzymes  on this patient. Once the results of these are available, will confer  with Dr. Birdie Sons in regards to further internal medicine workup.  Pulmonary wise she appears to be stable. We have instructed her to stop  the Symbicort as she is not getting benefit from this and having side  effects. Will see the patient back in followup.     Charlcie Cradle Delford Field, MD, [facility]  Electronically Signed    PEW/MedQ  DD: 08/12/2006  DT: 08/12/2006  Job #: [id number]   cc:   Valetta Mole. Swords,  MD

## 2010-09-27 NOTE — Assessment & Plan Note (Signed)
[facility]                               PULMONARY OFFICE NOTE   NAME:[name], [name] [name]                         MRN:          [id number]  DATE:01/06/2006                            DOB:          02/05/1924    [name] [name] is an 77 year old white female with a history of chronic  obstructive lung disease, asthmatic bronchitis, history of chronic  Mycobacterium avium-intracellulare infection, obstructive sleep apnea,  allergic rhinitis.  Patient is noting dyspnea with exertion, needing a  Singulair refill.  Maintaining Singulair 10 mg daily, is still on Prinivil  20 mg a day, Flonase 2 sprays each nostril daily, oxygen 2 liters nightly,  Protonix 40 mg daily.   PHYSICAL EXAMINATION:  VITAL SIGNS:  Temp 97, blood pressure 110/66, pulse  70.  Saturation 98% on room air.  CHEST:  Clear without evidence of wheeze or rhonchi.  CARDIAC:  Regular rate and rhythm without S3.  Normal S1 and S2.  ABDOMEN:  Soft and nontender.  EXTREMITIES:  No clubbing or edema.  SKIN:  Clear.  NEUROLOGIC:  Intact.  HEENT/NECK:  No jugular venous distention, lymphadenopathy.  Oropharynx  clear.  Neck supple.   IMPRESSION:  Upper airway instability with chronic upper airway narrowing,  obstructive sleep apnea, mild asthmatic bronchitis, and history of  Mycobacterium avium-intracellulare infection.   Plan for the patient is to switch Prinivil to Benicar to avoid upper airway  irritability.  Benicar will be at 20 mg daily.  She will maintain Flonase as  is, nasal oxygen nightly, and Singulair daily.  Will see the patient back in  followup in four months.                                   Charlcie Cradle Delford Field, MD, FCCP   PEW/MedQ  DD:  01/06/2006  DT:  01/07/2006  Job #:  [id number]   cc:   [name] Mole. Swords, MD

## 2010-09-30 ENCOUNTER — Telehealth: Payer: Self-pay | Admitting: Critical Care Medicine

## 2010-09-30 NOTE — Telephone Encounter (Signed)
I spoke with the patient and she states that [facility] is telling her she needs an ONO  On room air to requalify for her oxygen at night. The pt  is anxious about not using her oxygen with the test. Per [facility] the pt has to do the test on room air in order to qualify. I  advised the pt of this. Carron Curie, CMA

## 2010-09-30 NOTE — Telephone Encounter (Signed)
[id number] 

## 2010-10-01 ENCOUNTER — Telehealth: Payer: Self-pay | Admitting: *Deleted

## 2010-10-01 NOTE — Telephone Encounter (Signed)
Appt made

## 2010-10-01 NOTE — Telephone Encounter (Signed)
Left message for pt to come in for her UTI she had called about earlier.  She can see any MD.

## 2010-10-02 ENCOUNTER — Ambulatory Visit (INDEPENDENT_AMBULATORY_CARE_PROVIDER_SITE_OTHER): Payer: Medicare Other | Admitting: Internal Medicine

## 2010-10-02 ENCOUNTER — Encounter: Payer: Self-pay | Admitting: Internal Medicine

## 2010-10-02 VITALS — BP 134/90 | Temp 98.3°F | Wt 134.0 lb

## 2010-10-02 DIAGNOSIS — N390 Urinary tract infection, site not specified: Secondary | ICD-10-CM

## 2010-10-02 LAB — POCT URINALYSIS DIPSTICK
Ketones, UA: NEGATIVE
Nitrite, UA: NEGATIVE
Protein, UA: NEGATIVE
pH, UA: 6.5

## 2010-10-02 MED ORDER — CIPROFLOXACIN HCL 250 MG PO TABS: 250.0000 mg | ORAL_TABLET | Freq: Two times a day (BID) | ORAL | Status: DC

## 2010-10-02 NOTE — Progress Notes (Signed)
  Subjective:    Patient ID: [name] [name], female    DOB: 1924-03-09, 77 y.o.   MRN: [id number]  HPI 1 week of dysuria, urgency, chilled sensation No relief with cranberry juice  Past Medical History  Diagnosis Date  . ANXIETY 08/16/2008  . CAD, UNSPECIFIED SITE 10/17/2008  . CHOLELITHIASIS 08/23/2008  . COPD 01/04/2010    FeV1 101%, DLCO64% 2008  . 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 DIVERTICULOSIS-COLON 04/07/2008  . GERD 04/19/2007  . HYPERSOMNIA, ASSOCIATED WITH SLEEP APNEA 02/01/2007  . HYPERTENSION 11/12/2006  . MITRAL VALVE PROLAPSE, HX OF 10/07/2008  . NEOPLASM, MALIGNANT, BREAST, RIGHT 02/15/2008  . OSTEOPOROSIS 04/04/2008  . Raynaud's syndrome 04/25/2009  . Atrial fibrillation   . Airway obstruction   . History of diverticulitis of colon   . Tortuous colon   . Anal stricture   . Chronic constipation    Past Surgical History  Procedure Date  . Abdominal hysterectomy   . Bilateral salpingoophorectomy     s/p prolapsed bladder  . Bladder surgery     prolapsed  . Thoracotomy     granuloma, pulmonary-thoracotomy  . Breast surgery 07-21-07    mastectomy (right), all margins neg and LNs neg   . Appendectomy   . Tonsillectomy   . Rotator cuff repair   . Mastectomy     right    reports that she quit smoking about 27 years ago. Her smoking use included Cigarettes. She has a 60 pack-year smoking history. She has never used smokeless tobacco. She reports that she drinks alcohol. She reports that she does not use illicit drugs. family history includes Cancer in an unspecified family member; Colon cancer in an unspecified family member; Parkinsonism in her father; and Uterine cancer in an unspecified family member. Allergies  Allergen Reactions  . Penicillins     REACTION: hives  . Prednisone   . Shrimp Flavor     REACTION: burning, itching, throat swelling  . Sulfamethoxazole     REACTION: Nausea/vomiting      Review of Systems  patient denies chest pain,  shortness of breath, orthopnea. Denies lower extremity edema, abdominal pain, change in appetite, change in bowel movements. Patient denies rashes, musculoskeletal complaints. No other specific complaints in a complete review of systems.      Objective:   Physical Exam  Well-developed well-nourished female in no acute distress. HEENT exam atraumatic, normocephalic, extraocular muscles are intact. Neck is supple. No jugular venous distention no thyromegaly. Chest clear to auscultation without increased work of breathing. Cardiac exam S1 and S2 are regular. Abdominal exam active bowel sounds, soft, nontender.       Assessment & Plan:  uti-needs Rx Side effects discussed Cipro

## 2010-10-09 ENCOUNTER — Ambulatory Visit (INDEPENDENT_AMBULATORY_CARE_PROVIDER_SITE_OTHER)
Admission: RE | Admit: 2010-10-09 | Discharge: 2010-10-09 | Disposition: A | Discharge status: Home / Self Care | Payer: Medicare Other | Source: Ambulatory Visit | Attending: Critical Care Medicine | Admitting: Critical Care Medicine

## 2010-10-09 ENCOUNTER — Ambulatory Visit (INDEPENDENT_AMBULATORY_CARE_PROVIDER_SITE_OTHER): Payer: Medicare Other | Admitting: Critical Care Medicine

## 2010-10-09 ENCOUNTER — Encounter: Payer: Self-pay | Admitting: Critical Care Medicine

## 2010-10-09 VITALS — BP 118/82 | HR 78 | Temp 98.4°F | Ht 62.0 in | Wt 132.2 lb

## 2010-10-09 DIAGNOSIS — J40 Bronchitis, not specified as acute or chronic: Secondary | ICD-10-CM

## 2010-10-09 DIAGNOSIS — J449 Chronic obstructive pulmonary disease, unspecified: Secondary | ICD-10-CM

## 2010-10-09 MED ORDER — DOXYCYCLINE HYCLATE 100 MG PO TABS: 100.0000 mg | ORAL_TABLET | Freq: Two times a day (BID) | ORAL | Status: AC

## 2010-10-09 MED ORDER — TIOTROPIUM BROMIDE MONOHYDRATE 18 MCG IN CAPS: 18.0000 ug | ORAL_CAPSULE | Freq: Every day | RESPIRATORY_TRACT | Status: DC

## 2010-10-09 MED ORDER — BECLOMETHASONE DIPROPIONATE 80 MCG/ACT IN AERS: 1.0000 | INHALATION_SPRAY | RESPIRATORY_TRACT | Status: DC | PRN

## 2010-10-09 NOTE — Patient Instructions (Signed)
Start Qvar two puff twice a day until sample gone Stay on nasonex/spiriva Start Doxycycline one twice daily for 7days A chest xray will be obtained today No other medication changes Return 2 weeks with Tammy Parrett for recheck

## 2010-10-09 NOTE — Progress Notes (Signed)
Subjective:    Patient ID: [name] [name], female    DOB: May 29, 1923, 77 y.o.   MRN: [id number]  Cough This is a recurrent problem. The current episode started 1 to 4 weeks ago. The problem has been gradually worsening. The problem occurs hourly. The cough is productive of sputum (white to yellow ). Associated symptoms include chest pain, heartburn, nasal congestion, postnasal drip, a sore throat, shortness of breath and wheezing. Pertinent negatives include no chills, fever, hemoptysis or rhinorrhea. Associated symptoms comments: Notes hoarseness. The symptoms are aggravated by nothing. She has tried nothing for the symptoms. Her past medical history is significant for asthma, bronchitis, COPD and pneumonia.  Shortness of Breath This is a chronic problem. The current episode started 1 to 4 weeks ago. The problem occurs daily. The problem has been gradually worsening. Associated symptoms include chest pain, a sore throat, sputum production and wheezing. Pertinent negatives include no fever, hemoptysis, leg swelling, orthopnea, PND or rhinorrhea. Her past medical history is significant for asthma, COPD and pneumonia.   This is an 77 y.o.   patient with a history of COPD and mycobacterium avium intracellulare with chronic granulomatous lung disease. The pt also has hx of upper airway obstruction with difficult intubation with prior operative procedures. Also hx Breast CA s/p resection and XRT. Hx Gallstone disease with conservative Rx due to difficult intubation status.  08/09/10 Last ov 2/12 was acute work in with RA Rx was :Given her history of fragile COPD , will treat with antibiotic  Ceftin  was sent but changed to Biaxin due to PCN alergy - hives Was better after the 2/12 Ov, then since that time more dyspnea and not walking as much due to R Knee pain.  Also is coughing more . See symptoms above  10/10/2010 At last ov we Rx pred and pt had severe reaction.  Never any better.  Now no chest pain.   Notes labored breathing and feels tight.  Pain in back. Cough is productive of yellow.  No real fever.  No real edema in feet.     Had a recent UTI and saw [facility] and Rx cipro.  Past Medical History  Diagnosis Date  . ANXIETY 08/16/2008  . CAD, UNSPECIFIED SITE 10/17/2008  . CHOLELITHIASIS 08/23/2008  . COPD 01/04/2010    FeV1 101%, DLCO64% 2008  . 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 DIVERTICULOSIS-COLON 04/07/2008  . GERD 04/19/2007  . HYPERSOMNIA, ASSOCIATED WITH SLEEP APNEA 02/01/2007  . HYPERTENSION 11/12/2006  . MITRAL VALVE PROLAPSE, HX OF 10/07/2008  . NEOPLASM, MALIGNANT, BREAST, RIGHT 02/15/2008  . OSTEOPOROSIS 04/04/2008  . Raynaud's syndrome 04/25/2009  . Atrial fibrillation   . Airway obstruction   . History of diverticulitis of colon   . Tortuous colon   . Anal stricture   . Chronic constipation      Family History  Problem Relation Age of Onset  . Parkinsonism Father   . Cancer      family  . Colon cancer      aunt, age 47  . Uterine cancer      aunt     History   Social History  . Marital Status: Widowed    Spouse Name: N/A    Number of Children: N/A  . Years of Education: N/A   Occupational History  . Not on file.   Social History Main Topics  . Smoking status: Former Smoker -- 2.0 packs/day for 30 years    Types: Cigarettes  Quit date: 05/13/1983  . Smokeless tobacco: Never Used  . Alcohol Use: Yes     1-2 glaases wine per day  . Drug Use: No  . Sexually Active: Not on file   Other Topics Concern  . Not on file   Social History Narrative  . No narrative on file     Allergies  Allergen Reactions  . Penicillins     REACTION: hives  . Prednisone   . Shrimp Flavor     REACTION: burning, itching, throat swelling  . Sulfamethoxazole     REACTION: Nausea/vomiting     Outpatient Prescriptions Prior to Visit  Medication Sig Dispense Refill  . aspirin 325 MG tablet Take 325 mg by mouth daily.        . diazepam (VALIUM) 5 MG  tablet take 1 tablet by mouth at bedtime if needed for sleep  30 tablet  3  . fexofenadine (ALLEGRA) 180 MG tablet Take 1 tablet (180 mg total) by mouth daily.  30 tablet  11  . losartan (COZAAR) 100 MG tablet Take 1 tablet (100 mg total) by mouth daily.  90 tablet  3  . meloxicam (MOBIC) 7.5 MG tablet Take 7.5 mg by mouth daily.        . mometasone (NASONEX) 50 MCG/ACT nasal spray 2 sprays by Nasal route 2 (two) times daily.  17 g  11  . nitroGLYCERIN (NITROSTAT) 0.4 MG SL tablet Place 0.4 mg under the tongue every 5 (five) minutes as needed.        Marland Kitchen omeprazole (PRILOSEC) 20 MG capsule Take 1 capsule (20 mg total) by mouth daily.  90 capsule  3  . Polyethylene Glycol 1000 POWD 1,000 mg by Does not apply route daily.        Marland Kitchen tiotropium (SPIRIVA) 18 MCG inhalation capsule Place 1 capsule (18 mcg total) into inhaler and inhale daily.  90 capsule  3  . ciprofloxacin (CIPRO) 250 MG tablet Take 1 tablet (250 mg total) by mouth 2 (two) times daily.  10 tablet  0      Review of Systems  Constitutional: Negative for fever and chills.  HENT: Positive for sore throat and postnasal drip. Negative for rhinorrhea.   Respiratory: Positive for cough, sputum production, shortness of breath and wheezing. Negative for hemoptysis.   Cardiovascular: Positive for chest pain. Negative for orthopnea, leg swelling and PND.  Gastrointestinal: Positive for heartburn.       Objective:   Physical Exam  Gen: Pleasant, well-nourished, in no distress,  normal affect  ENT: Nares with moderate erythema no purulence,  mouth clear,  oropharynx clear, moderate  postnasal drip  Neck: No JVD, no TMG, no carotid bruits  Lungs: No use of accessory muscles, no dullness to percussion, distant BS, scattered rhonchi,    Cardiovascular: RRR, heart sounds normal, no murmur or gallops, no peripheral edema  Abdomen: soft and NT, no HSM,  BS normal  Musculoskeletal: No deformities, no cyanosis or clubbing  Neuro: alert,  non focal  Skin: Warm, no lesions or rashes         Assessment & Plan:   COPD Ongoing lower airway inflammation, failed pred pulse d/t side effects. Plan Doxycycline 100mg  bid x 7days Stay on nasonex and spiriva Add Qvar two puff bid x one sample then d/c Rov with Tammy Parrett in two weeks      Updated Medication List Outpatient Encounter Prescriptions as of 10/09/2010  Medication Sig Dispense Refill  . aspirin 325  MG tablet Take 325 mg by mouth daily.        . diazepam (VALIUM) 5 MG tablet take 1 tablet by mouth at bedtime if needed for sleep  30 tablet  3  . fexofenadine (ALLEGRA) 180 MG tablet Take 1 tablet (180 mg total) by mouth daily.  30 tablet  11  . losartan (COZAAR) 100 MG tablet Take 1 tablet (100 mg total) by mouth daily.  90 tablet  3  . meloxicam (MOBIC) 7.5 MG tablet Take 7.5 mg by mouth daily.        . mometasone (NASONEX) 50 MCG/ACT nasal spray 2 sprays by Nasal route 2 (two) times daily.  17 g  11  . nitroGLYCERIN (NITROSTAT) 0.4 MG SL tablet Place 0.4 mg under the tongue every 5 (five) minutes as needed.        Marland Kitchen omeprazole (PRILOSEC) 20 MG capsule Take 1 capsule (20 mg total) by mouth daily.  90 capsule  3  . Polyethylene Glycol 1000 POWD 1,000 mg by Does not apply route daily.        Marland Kitchen tiotropium (SPIRIVA) 18 MCG inhalation capsule Place 1 capsule (18 mcg total) into inhaler and inhale daily.  90 capsule  3  . DISCONTD: tiotropium (SPIRIVA) 18 MCG inhalation capsule Place 1 capsule (18 mcg total) into inhaler and inhale daily.  90 capsule  3  . beclomethasone (QVAR) 80 MCG/ACT inhaler Inhale 1 puff into the lungs as needed.  1 Inhaler  0  . doxycycline (VIBRA-TABS) 100 MG tablet Take 1 tablet (100 mg total) by mouth 2 (two) times daily.  14 tablet  0  . DISCONTD: ciprofloxacin (CIPRO) 250 MG tablet Take 1 tablet (250 mg total) by mouth 2 (two) times daily.  10 tablet  0

## 2010-10-10 NOTE — Progress Notes (Signed)
Quick Note:  Called, spoke with pt. She was informed of CXR results and recs per PW. She verbalized understanding of this. ______ 

## 2010-10-10 NOTE — Assessment & Plan Note (Addendum)
Ongoing lower airway inflammation, failed pred pulse d/t side effects. Plan Doxycycline 100mg  bid x 7days Stay on nasonex and spiriva Add Qvar two puff bid x one sample then d/c Rov with Tammy Parrett in two weeks

## 2010-10-10 NOTE — Progress Notes (Signed)
Quick Note:  Notify the patient that the Xray is stable and no pneumonia No change in medications are recommended. Continue current meds as prescribed at last office visit ______ 

## 2010-10-11 ENCOUNTER — Telehealth: Payer: Self-pay | Admitting: Critical Care Medicine

## 2010-10-11 NOTE — Telephone Encounter (Signed)
Called and spoke with patient and advised of ONO on RA results. Advised pt to continue to use oxygen at night on 2 lpm. Pt stated that she uses 2.5 lpm and that this is what she has always been on. Just wanted to inform you of this and if this is correct. Please advise.

## 2010-10-11 NOTE — Telephone Encounter (Signed)
Call the pt and let her know I reviewed the ONO on RA,  She still qualifies and should use oxygen at night 2Liters  Send report to [facility],  It shows desat to 81%   28% of recorded time.   I have asked the ONO  To be scanned into emr.

## 2010-10-13 NOTE — Telephone Encounter (Signed)
This is ok

## 2010-10-14 NOTE — Telephone Encounter (Signed)
Called, spoke with pt.  She is aware this is ok per PW for the 2.5lpm at bedtime.  She verbalized understanding of this and voiced no further questions.

## 2010-10-15 ENCOUNTER — Encounter: Payer: Self-pay | Admitting: Critical Care Medicine

## 2010-10-24 ENCOUNTER — Ambulatory Visit (INDEPENDENT_AMBULATORY_CARE_PROVIDER_SITE_OTHER): Payer: Medicare Other | Admitting: Adult Health

## 2010-10-24 ENCOUNTER — Encounter: Payer: Self-pay | Admitting: Adult Health

## 2010-10-24 ENCOUNTER — Telehealth: Payer: Self-pay | Admitting: Adult Health

## 2010-10-24 DIAGNOSIS — J449 Chronic obstructive pulmonary disease, unspecified: Secondary | ICD-10-CM

## 2010-10-24 MED ORDER — CEFDINIR 300 MG PO CAPS: 300.0000 mg | ORAL_CAPSULE | Freq: Two times a day (BID) | ORAL | Status: AC

## 2010-10-24 NOTE — Telephone Encounter (Signed)
Yes we are aware she says she can take cephalosporins  So she is fine to take Thanks for [facility]

## 2010-10-24 NOTE — Progress Notes (Signed)
  Subjective:    Patient ID: [name] [name], female    DOB: Aug 24, 1923, 77 y.o.   MRN: [id number]  HPI his is an 77 y.o.   patient with a history of COPD and mycobacterium avium intracellulare with chronic granulomatous lung disease. The pt also has hx of upper airway obstruction with difficult intubation with prior operative procedures. Also hx Breast CA s/p resection and XRT. Hx Gallstone disease with conservative Rx due to difficult intubation status.  08/09/10 Last ov 2/12 was acute work in with RA Rx was :Given her history of fragile COPD , will treat with antibiotic  Ceftin  was sent but changed to Biaxin due to PCN alergy - hives Was better after the 2/12 Ov, then since that time more dyspnea and not walking as much due to R Knee pain.  Also is coughing more . See symptoms above  10/10/2010 At last ov we Rx pred and pt had severe reaction.  Never any better.  Now no chest pain.  Notes labored breathing and feels tight.  Pain in back.Cough is productive of yellow.  No real fever.  No real edema in feet.  >>tx w/ doxycycline  10/24/10 follow up  Returns for follow up . Complains that she does not feel any better. Very stress that she has family reuinion with family coming to her home next week. Reports breathing is unchanged since last ov w/ SOB, wheezing, prod cough with yellow mucus, hoarseness, loos of appetite.  reports did not tolerate the doxycycline given at last ov.         Review of Systems Constitutional:   No  weight loss, night sweats,  Fevers, chills, fatigue, or  lassitude.  HEENT:   No headaches,  Difficulty swallowing,  Tooth/dental problems, or  Sore throat,             + sneezing, itching,  nasal congestion, post nasal drip,   CV:  No chest pain,  Orthopnea, PND, swelling in lower extremities, anasarca, dizziness, palpitations, syncope.   GI  No heartburn, indigestion, abdominal pain, nausea, vomiting, diarrhea, change in bowel habits, loss of appetite, bloody stools.     Resp:  ,  No coughing up of blood.     No wheezing.  No chest wall deformity  Skin: no rash or lesions.  GU: no dysuria, change in color of urine, no urgency or frequency.  No flank pain, no hematuria   MS:  No joint pain or swelling.  No decreased range of motion.  No back pain.  Psych:  No change in mood or affect. No depression or anxiety.  No memory loss.         Objective:   Physical Exam GEN: A/Ox3; pleasant , NAD, elderly  HEENT:  [place]/AT,  EACs-clear, TMs-wnl, NOSE-clear, THROAT-clear, no lesions, no postnasal drip or exudate noted.   NECK:  Supple w/ fair ROM; no JVD; normal carotid impulses w/o bruits; no thyromegaly or nodules palpated; no lymphadenopathy.  RESP  Coarse BS w/ no wheezingno accessory muscle use, no dullness to percussion  CARD:  RRR, no m/r/g  , no peripheral edema, pulses intact, no cyanosis or clubbing.  GI:   Soft & nt; nml bowel sounds; no organomegaly or masses detected.  Musco: Warm bil, no deformities or joint swelling noted.   Neuro: alert, no focal deficits noted.    Skin: Warm, no lesions or rashes         Assessment & Plan:

## 2010-10-24 NOTE — Patient Instructions (Signed)
Omnicef 300mg  Twice daily  For 10 days  Mucinex DM Twice daily  As needed  Cough/congestion  Fluids and rest  Saline nasal rinses daily  follow up Dr. Delford Field  In 6 weeks  Please contact office for sooner follow up if symptoms do not improve or worsen or seek emergency care

## 2010-10-24 NOTE — Telephone Encounter (Signed)
Called, spoke with Denny Peon.  She is aware of TP's statement and verbalized understanding of this.

## 2010-10-24 NOTE — Assessment & Plan Note (Addendum)
Slow to resolve flare with supsected sinusitis  Plan:  Omnicef 300mg  Twice daily  For 10 days  Mucinex DM Twice daily  As needed  Cough/congestion  Fluids and rest  Saline nasal rinses daily  follow up Dr. Delford Field  In 6 weeks  Please contact office for sooner follow up if symptoms do not improve or worsen or seek emergency care

## 2010-10-24 NOTE — Telephone Encounter (Signed)
Called and spoke with Denny Peon from [facility]. Denny Peon just wanted to give [place] an FYI.  States pt was prescribed Omnicef today in the office.  Pt is allergic to PCN- causes hives/itching.  Denny Peon states pt was aware of the allergic reaction and still went ahead and picked up rx to take.  Will forward message to TP as an FYI.

## 2010-10-25 ENCOUNTER — Other Ambulatory Visit: Payer: Self-pay | Admitting: Internal Medicine

## 2010-10-28 ENCOUNTER — Emergency Department (HOSPITAL_COMMUNITY): Payer: Medicare Other

## 2010-10-28 ENCOUNTER — Ambulatory Visit: Payer: Medicare Other | Admitting: Adult Health

## 2010-10-28 ENCOUNTER — Telehealth: Payer: Self-pay | Admitting: Gastroenterology

## 2010-10-28 ENCOUNTER — Emergency Department (HOSPITAL_COMMUNITY)
Admission: EM | Admit: 2010-10-28 | Discharge: 2010-10-28 | Disposition: A | Discharge status: Home / Self Care | Payer: Medicare Other | Attending: Emergency Medicine | Admitting: Emergency Medicine

## 2010-10-28 DIAGNOSIS — K5900 Constipation, unspecified: Secondary | ICD-10-CM | POA: Insufficient documentation

## 2010-10-28 DIAGNOSIS — R109 Unspecified abdominal pain: Secondary | ICD-10-CM | POA: Insufficient documentation

## 2010-10-28 DIAGNOSIS — J449 Chronic obstructive pulmonary disease, unspecified: Secondary | ICD-10-CM | POA: Insufficient documentation

## 2010-10-28 DIAGNOSIS — I10 Essential (primary) hypertension: Secondary | ICD-10-CM | POA: Insufficient documentation

## 2010-10-28 DIAGNOSIS — Z79899 Other long term (current) drug therapy: Secondary | ICD-10-CM | POA: Insufficient documentation

## 2010-10-28 DIAGNOSIS — Z7982 Long term (current) use of aspirin: Secondary | ICD-10-CM | POA: Insufficient documentation

## 2010-10-28 DIAGNOSIS — J4489 Other specified chronic obstructive pulmonary disease: Secondary | ICD-10-CM | POA: Insufficient documentation

## 2010-10-28 DIAGNOSIS — R110 Nausea: Secondary | ICD-10-CM | POA: Insufficient documentation

## 2010-10-28 LAB — CBC
MCH: 29.9 pg (ref 26.0–34.0)
MCHC: 34.5 g/dL (ref 30.0–36.0)
MCV: 86.6 fL (ref 78.0–100.0)
Platelets: 169 10*3/uL (ref 150–400)
RBC: 4.62 MIL/uL (ref 3.87–5.11)
RDW: 13.3 % (ref 11.5–15.5)

## 2010-10-28 LAB — DIFFERENTIAL
Eosinophils Absolute: 0.1 10*3/uL (ref 0.0–0.7)
Eosinophils Relative: 2 % (ref 0–5)
Lymphs Abs: 1.2 10*3/uL (ref 0.7–4.0)
Monocytes Absolute: 0.5 10*3/uL (ref 0.1–1.0)
Monocytes Relative: 10 % (ref 3–12)

## 2010-10-28 LAB — URINALYSIS, ROUTINE W REFLEX MICROSCOPIC
Bilirubin Urine: NEGATIVE
Leukocytes, UA: NEGATIVE
Nitrite: NEGATIVE
Specific Gravity, Urine: 1.005 (ref 1.005–1.030)
Urobilinogen, UA: 0.2 mg/dL (ref 0.0–1.0)
pH: 7 (ref 5.0–8.0)

## 2010-10-28 LAB — COMPREHENSIVE METABOLIC PANEL
AST: 22 U/L (ref 0–37)
CO2: 25 mEq/L (ref 19–32)
Calcium: 8.8 mg/dL (ref 8.4–10.5)
Creatinine, Ser: 0.53 mg/dL (ref 0.50–1.10)
GFR calc non Af Amer: >60 mL/min (ref >60)
Total Protein: 6.1 g/dL (ref 6.0–8.3)

## 2010-10-28 LAB — OCCULT BLOOD, POC DEVICE: Fecal Occult Bld: NEGATIVE

## 2010-10-28 NOTE — Telephone Encounter (Signed)
OK with me.

## 2010-10-28 NOTE — Telephone Encounter (Signed)
Dr Juanda Chance will you accept the patient ?

## 2010-10-28 NOTE — Telephone Encounter (Signed)
Dr. Stark do you approve of change? 

## 2010-10-28 NOTE — Telephone Encounter (Signed)
OK with me., please oote that I will be out of town

## 2010-10-29 NOTE — Telephone Encounter (Signed)
Appointment scheduled with the patient for 11/14/10 10:00.  I have mailed her new patient paperwork.  The patient went to the ER yesterday and had a large fecal impaction.  They have started her on laxatives.  She will continue the ER regimen until she returns to the office on 11/14/10

## 2010-10-29 NOTE — Telephone Encounter (Signed)
Left message for patient to call back  

## 2010-11-14 ENCOUNTER — Ambulatory Visit (INDEPENDENT_AMBULATORY_CARE_PROVIDER_SITE_OTHER): Payer: Medicare Other | Admitting: Internal Medicine

## 2010-11-14 ENCOUNTER — Encounter: Payer: Self-pay | Admitting: Internal Medicine

## 2010-11-14 VITALS — BP 126/68 | HR 60 | Ht 63.0 in | Wt 130.0 lb

## 2010-11-14 DIAGNOSIS — K5901 Slow transit constipation: Secondary | ICD-10-CM

## 2010-11-14 DIAGNOSIS — K219 Gastro-esophageal reflux disease without esophagitis: Secondary | ICD-10-CM

## 2010-11-14 MED ORDER — PSYLLIUM 58.6 % PO POWD: ORAL | Status: DC

## 2010-11-14 MED ORDER — DIBUCAINE 1 % EX OINT: TOPICAL_OINTMENT | CUTANEOUS | Status: AC | PRN

## 2010-11-14 NOTE — Patient Instructions (Signed)
Please take Metamucil 1 teaspoon dissolved in at least 8 ounces of water or juice once daily. This may be purchased over the counter. Please purchase Nupercainal ointment over the counter. You may apply this to your rectum as a topical anesthetic. CC: Dr Garth Schlatter

## 2010-11-14 NOTE — Progress Notes (Signed)
[name] [name] 11-12-1923 MRN [id number]     History of Present Illness:  This is an 77 year old white female with chronic constipation of at least 60 years duration. She has been seen by several gastroenterologists. She was evaluated thoroughly by Dr. Kinnie [name] but switched to [facility] because Dr. Kinnie [name] did not take Medicare. She has anal stenosis and severe narrowing of her left colon which precluded the colonoscopy. The exam was attempted in 1998. She subsequently had an air-contrast barium enema which showed severe diverticulosis of the left colon. She had a virtual colonoscopy in 2005 which showed diffuse diverticulosis. She also has a history of gastroesophageal reflux and possibly LPR causing occasional hoarseness. She had breast cancer and obstructive sleep apnea followed by Dr. Sherene [name]. An upper endoscopy in January 2010 showed a mild esophageal stricture at 38 cm which was not dilated. She has urinary stress incontinence and difficult evacuation of her stool. Her current laxative regimen consists of MiraLax 9 g 3 times a week alternating with Colace. She also takes milk of magnesia occasionally.   Past Medical History  Diagnosis Date  . ANXIETY 08/16/2008  . CAD, UNSPECIFIED SITE 10/17/2008  . CHOLELITHIASIS 08/23/2008  . COPD 01/04/2010    FeV1 101%, DLCO64% 2008  . 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 DIVERTICULOSIS-COLON 04/07/2008  . GERD 04/19/2007  . HYPERSOMNIA, ASSOCIATED WITH SLEEP APNEA 02/01/2007  . HYPERTENSION 11/12/2006  . MITRAL VALVE PROLAPSE, HX OF 10/07/2008  . NEOPLASM, MALIGNANT, BREAST, RIGHT 02/15/2008  . OSTEOPOROSIS 04/04/2008  . Raynaud's syndrome 04/25/2009  . Atrial fibrillation   . Airway obstruction   . History of diverticulitis of colon   . Tortuous colon   . Anal stricture   . Chronic constipation   . Fatty liver   . Hiatal hernia   . Esophageal stricture   . IBS (irritable bowel syndrome)   . Pulmonary fibrosis   . Sleep apnea   . COPD (chronic  obstructive pulmonary disease)    Past Surgical History  Procedure Date  . Abdominal hysterectomy   . Bilateral salpingoophorectomy     s/p prolapsed bladder  . Bladder surgery     prolapsed  . Thoracotomy     granuloma, pulmonary-thoracotomy  . Breast surgery 07-21-07    mastectomy (right), all margins neg and LNs neg   . Appendectomy   . Tonsillectomy   . Rotator cuff repair   . Mastectomy     right  . Breast cyst excision     left  . Mouth surgery     reports that she quit smoking about 27 years ago. Her smoking use included Cigarettes. She has a 60 pack-year smoking history. She has never used smokeless tobacco. She reports that she drinks alcohol. She reports that she does not use illicit drugs. family history includes Breast cancer in an unspecified family member; Colon cancer in an unspecified family member; Coronary artery disease in her father; Heart failure in her mother; Parkinsonism in her father; Stroke in her mother; and Uterine cancer in an unspecified family member. Allergies  Allergen Reactions  . Penicillins     REACTION: hives  . Prednisone   . Shrimp Flavor     REACTION: burning, itching, throat swelling  . Sulfamethoxazole     REACTION: Nausea/vomiting        Review of Systems:Denies dysphagia odynophagia. Admits to shortness of breath denies chest pain  The remainder of the 10  point ROS is negative except as outlined in H&P  Physical Exam: General appearance  Well developed, in no distress. Eyes- non icteric. HEENT nontraumatic, normocephalic. Mouth no lesions, tongue papillated, no cheilosis. Neck supple without adenopathy, thyroid not enlarged, no carotid bruits, no JVD. Lungs Clear to auscultation bilaterally. Cor normal S1 normal S2, regular rhythm , no murmur,  quiet precordium. Abdomen Soft, mildly tender abdomen especially in left lower quadrant and left middle quadrant. No distention. No bruits, liver edge at costal margin. Post  appendectomy scar and post hysterectomy scar.  Rectal:Slightly increased rectal sphincter tone but definitely no stenosis. Mild discomfort on digital exam. Small amount of Hemoccult-negative stool in rectal ampulla. No external hemorrhoids.  Extremities no pedal edema. Skin no lesions. Neurological alert and oriented x 3. Psychological normal mood and affect.  Assessment and Plan:  Problem #1 Chronic constipation due to colonic hypomotility and mild narrowing of the sigmoid colon due to diverticulosis. She is managing well on the regimen of MiraLax and Colace as well as a high-fiber diet. I suggested adding Metamucil 1 teaspoon daily and possibly using glycerin suppositories or Fleets enema for difficult evacuation. I will see her again in 6 months. I encouraged her to stay physically active. We agreed that surgical correction of her urinary incontinence and rectocele would not be advisable at her age.  Problem #2 Gastroesophageal reflux disease with LPR. This is well-controlled on Prilosec 20 mg daily.   11/14/2010 [name] [name]

## 2010-12-05 ENCOUNTER — Ambulatory Visit (INDEPENDENT_AMBULATORY_CARE_PROVIDER_SITE_OTHER): Payer: Medicare Other | Admitting: Surgery

## 2010-12-05 VITALS — Temp 97.1°F

## 2010-12-05 DIAGNOSIS — C50919 Malignant neoplasm of unspecified site of unspecified female breast: Secondary | ICD-10-CM

## 2010-12-05 NOTE — Progress Notes (Signed)
  [name] [name]  Is an 77 year old white female has a very complex history. She sees Dr. Birdie Sons is her primary care doctor. She's Dr. Danise Mina from a pulmonary standpoint.  She had a right breast carcinoma which was 6 mm invasive ductal carcinoma. This was ER and PR receptor negative, Ki67 was 14%, and HER-2/neu negative. Her surgery was in July 21, 2007 and was a right breast lumpectomy and right axillary sentinel lymph node biopsy.  Social last saw her she's actually done very well with her breast cancer. She had her mammograms in March of 2012 which were negative.  Her primary problem continued to her lung disease. She is on nightly oxygen.  Physical exam: Filed Vitals:   12/05/10 1459  Temp: 97.1 F (36.2 C)   Neck: Supple. No thyroid mass. Lymph nodes: No cervical, supraclavicular, or axillary adenopathy. Breasts: No palpable breast mass or nodule.  ASSESSMENT AND PLAN: 1.  Right breast cancer.  T1b, N0.  Disease free and doing well a little over 3 years from her surgery.  She will see me back in 6 months.  2.  History of gall stones.  Continue to observe.  3.  On home O2 at night. 4.  COPD. 5.  History of left thoracotomy where she still has pain issues.

## 2010-12-05 NOTE — Patient Instructions (Signed)
Doing well from breast cancer.  See me back in 6 months.

## 2010-12-09 ENCOUNTER — Ambulatory Visit (INDEPENDENT_AMBULATORY_CARE_PROVIDER_SITE_OTHER): Payer: Medicare Other | Admitting: Critical Care Medicine

## 2010-12-09 ENCOUNTER — Encounter: Payer: Self-pay | Admitting: Critical Care Medicine

## 2010-12-09 VITALS — BP 120/76 | HR 63 | Temp 97.7°F | Ht 63.0 in | Wt 132.6 lb

## 2010-12-09 DIAGNOSIS — J449 Chronic obstructive pulmonary disease, unspecified: Secondary | ICD-10-CM

## 2010-12-09 DIAGNOSIS — J40 Bronchitis, not specified as acute or chronic: Secondary | ICD-10-CM

## 2010-12-09 MED ORDER — BECLOMETHASONE DIPROPIONATE 80 MCG/ACT IN AERS: 1.0000 | INHALATION_SPRAY | Freq: Two times a day (BID) | RESPIRATORY_TRACT | Status: DC

## 2010-12-09 NOTE — Progress Notes (Signed)
Subjective:    Patient ID: [name] [name], female    DOB: [birth year], 77 y.o.   MRN: [id number]  HPI  his is an 77 y.o.   patient with a history of COPD and mycobacterium avium intracellulare with chronic granulomatous lung disease. The pt also has hx of upper airway obstruction with difficult intubation with prior operative procedures. Also hx Breast CA s/p resection and XRT. Hx Gallstone disease with conservative Rx due to difficult intubation status.  08/09/10 Last ov 2/12 was acute work in with RA Rx was :Given her history of fragile COPD , will treat with antibiotic  Ceftin  was sent but changed to Biaxin due to PCN alergy - hives Was better after the 2/12 Ov, then since that time more dyspnea and not walking as much due to R Knee pain.  Also is coughing more . See symptoms above  10/10/2010 At last ov we Rx pred and pt had severe reaction.  Never any better.  Now no chest pain.  Notes labored breathing and feels tight.  Pain in back.Cough is productive of yellow.  No real fever.  No real edema in feet.  >>tx w/ doxycycline  10/24/10 follow up  Returns for follow up . Complains that she does not feel any better. Very stress that she has family reuinion with family coming to her home next week. Reports breathing is unchanged since last ov w/ SOB, wheezing, prod cough with yellow mucus, hoarseness, loos of appetite.  reports did not tolerate the doxycycline given at last ov.     12/09/2010 Saw NP 6/14.   Pt offered Omnicef 300mg  Twice daily For 10 days  Mucinex DM Twice daily As needed Cough/congestion  Pt did not take omniceph d/t concerns re: pcn allergy Occ mucus and deep yellow.  Still dyspneic.  Did not tolerate doxy CXR: 5/12 IMPRESSION:  Chronic surgical and interstitial scarring changes. No acute  pulmonary findings.      Review of Systems  Constitutional:   No  weight loss, night sweats,  Fevers, chills, fatigue, or  lassitude.  HEENT:   No headaches,  Difficulty  swallowing,  Tooth/dental problems, or  Sore throat,             + sneezing, itching,  nasal congestion, post nasal drip,   CV:  No chest pain,  Orthopnea, PND, swelling in lower extremities, anasarca, dizziness, palpitations, syncope.   GI  No heartburn, indigestion, abdominal pain, nausea, vomiting, diarrhea, change in bowel habits, loss of appetite, bloody stools.   Resp:  ,  No coughing up of blood.     No wheezing.  No chest wall deformity  Skin: no rash or lesions.  GU: no dysuria, change in color of urine, no urgency or frequency.  No flank pain, no hematuria   MS:  No joint pain or swelling.  No decreased range of motion.  No back pain.  Psych:  No change in mood or affect. No depression or anxiety.  No memory loss.         Objective:   Physical Exam  GEN: A/Ox3; pleasant , NAD, elderly  HEENT:  [place]/AT,  EACs-clear, TMs-wnl, NOSE-clear, THROAT-clear, no lesions, no postnasal drip or exudate noted.   NECK:  Supple w/ fair ROM; no JVD; normal carotid impulses w/o bruits; no thyromegaly or nodules palpated; no lymphadenopathy.  RESP  Coarse BS w/ no wheezingno accessory muscle use, no dullness to percussion  CARD:  RRR, no m/r/g  , no peripheral  edema, pulses intact, no cyanosis or clubbing.  GI:   Soft & nt; nml bowel sounds; no organomegaly or masses detected.  Musco: Warm bil, no deformities or joint swelling noted.   Neuro: alert, no focal deficits noted.    Skin: Warm, no lesions or rashes         Assessment & Plan:   COPD  home o2 requiring COPD Golds III Pt not using ICS regularly Major issues are anxiety and stress over home situation, needing to downsize Plan Resume Qvar Try to downsize and get house sold and move into retirement community    Updated Medication List Outpatient Encounter Prescriptions as of 12/09/2010  Medication Sig Dispense Refill  . AMBULATORY NON FORMULARY MEDICATION Oxygen at bedtime       . aspirin 325 MG tablet Take  325 mg by mouth daily.        . beclomethasone (QVAR) 80 MCG/ACT inhaler Inhale 1 puff into the lungs 2 (two) times daily.  1 Inhaler  0  . diazepam (VALIUM) 5 MG tablet take 1 tablet by mouth at bedtime if needed for sleep  30 tablet  3  . Docusate Calcium (STOOL SOFTENER PO) Take by mouth at bedtime.        . fexofenadine (ALLEGRA) 180 MG tablet Take 1 tablet (180 mg total) by mouth daily.  30 tablet  11  . losartan (COZAAR) 100 MG tablet Take 1 tablet (100 mg total) by mouth daily.  90 tablet  3  . meloxicam (MOBIC) 7.5 MG tablet Take 7.5 mg by mouth daily as needed.       . mometasone (NASONEX) 50 MCG/ACT nasal spray 2 sprays by Nasal route 2 (two) times daily.  17 g  11  . nitroGLYCERIN (NITROSTAT) 0.4 MG SL tablet Place 0.4 mg under the tongue every 5 (five) minutes as needed.        Marland Kitchen omeprazole (PRILOSEC) 20 MG capsule Take 1 capsule (20 mg total) by mouth daily.  90 capsule  3  . Polyethylene Glycol 1000 POWD Take 1,000 mg by mouth. Takes as needed      . psyllium (METAMUCIL) 58.6 % powder Take 1 teaspoons dissolved in at least 8 ounces of water or juice and drink once daily  1 g  0  . tiotropium (SPIRIVA) 18 MCG inhalation capsule Place 1 capsule (18 mcg total) into inhaler and inhale daily.  90 capsule  3  . DISCONTD: beclomethasone (QVAR) 80 MCG/ACT inhaler Inhale 1 puff into the lungs as needed.  1 Inhaler  0

## 2010-12-09 NOTE — Patient Instructions (Addendum)
Resume Qvar one puff twice daily No other medication changes Return in       3 months

## 2010-12-10 ENCOUNTER — Ambulatory Visit: Payer: Medicare Other | Admitting: Critical Care Medicine

## 2010-12-10 NOTE — Assessment & Plan Note (Signed)
home o2 requiring COPD Golds III Pt not using ICS regularly Major issues are anxiety and stress over home situation, needing to downsize Plan Resume Qvar Try to downsize and get house sold and move into retirement community

## 2010-12-11 ENCOUNTER — Telehealth: Payer: Self-pay | Admitting: *Deleted

## 2010-12-11 NOTE — Telephone Encounter (Signed)
Pt. Saw Dr. Delford Field, and asked about whooping cough.  He did not have the injection or any information to give her, and asked that she ask Dr. Cato Mulligan if she needs one.  States she is around a lot of children at church.

## 2010-12-12 NOTE — Telephone Encounter (Signed)
Will schedule Tdap.

## 2010-12-12 NOTE — Telephone Encounter (Signed)
Ok to give tdap?

## 2010-12-23 ENCOUNTER — Telehealth: Payer: Self-pay | Admitting: Critical Care Medicine

## 2010-12-23 DIAGNOSIS — J40 Bronchitis, not specified as acute or chronic: Secondary | ICD-10-CM

## 2010-12-23 MED ORDER — TIOTROPIUM BROMIDE MONOHYDRATE 18 MCG IN CAPS: 18.0000 ug | ORAL_CAPSULE | Freq: Every day | RESPIRATORY_TRACT | Status: DC

## 2010-12-23 NOTE — Telephone Encounter (Signed)
Rx verified and sent to pharmacy.

## 2010-12-25 ENCOUNTER — Telehealth: Payer: Self-pay | Admitting: Internal Medicine

## 2010-12-25 ENCOUNTER — Ambulatory Visit (INDEPENDENT_AMBULATORY_CARE_PROVIDER_SITE_OTHER): Payer: Medicare Other | Admitting: Internal Medicine

## 2010-12-25 ENCOUNTER — Encounter: Payer: Self-pay | Admitting: Internal Medicine

## 2010-12-25 VITALS — BP 132/94 | Temp 98.3°F | Wt 129.0 lb

## 2010-12-25 DIAGNOSIS — N390 Urinary tract infection, site not specified: Secondary | ICD-10-CM

## 2010-12-25 DIAGNOSIS — R300 Dysuria: Secondary | ICD-10-CM

## 2010-12-25 DIAGNOSIS — Z23 Encounter for immunization: Secondary | ICD-10-CM

## 2010-12-25 DIAGNOSIS — N3090 Cystitis, unspecified without hematuria: Secondary | ICD-10-CM | POA: Insufficient documentation

## 2010-12-25 LAB — POCT URINALYSIS DIPSTICK
Glucose, UA: NEGATIVE
Nitrite, UA: NEGATIVE
Protein, UA: NEGATIVE
Urobilinogen, UA: 0.2

## 2010-12-25 MED ORDER — CIPROFLOXACIN HCL 250 MG PO TABS: 250.0000 mg | ORAL_TABLET | Freq: Two times a day (BID) | ORAL | Status: AC

## 2010-12-25 NOTE — Progress Notes (Signed)
Subjective:    Patient ID: [name] [name], female    DOB: July 08, 1923, 77 y.o.   MRN: [id number]  HPI  77 y/o female who comes for Tdap complains of dysuria x 1 week.  She has hx of chronic urinary incontinence after bladder tac procedure.  She wears pads and usually changes 2 x per day.  She denies hx of recurrent UTIs.  She has been more stressed than usual because she is in process of selling her house.   Review of Systems  She denies fever or back pain  Past Medical History  Diagnosis Date  . ANXIETY 08/16/2008  . CAD, UNSPECIFIED SITE 10/17/2008  . CHOLELITHIASIS 08/23/2008  . COPD 01/04/2010    FeV1 101%, DLCO64% 2008  . 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 DIVERTICULOSIS-COLON 04/07/2008  . GERD 04/19/2007  . HYPERSOMNIA, ASSOCIATED WITH SLEEP APNEA 02/01/2007  . HYPERTENSION 11/12/2006  . MITRAL VALVE PROLAPSE, HX OF 10/07/2008  . NEOPLASM, MALIGNANT, BREAST, RIGHT 02/15/2008  . OSTEOPOROSIS 04/04/2008  . Raynaud's syndrome 04/25/2009  . Atrial fibrillation   . Airway obstruction   . History of diverticulitis of colon   . Tortuous colon   . Anal stricture   . Chronic constipation   . Fatty liver   . Hiatal hernia   . Esophageal stricture   . IBS (irritable bowel syndrome)   . Pulmonary fibrosis   . Sleep apnea   . COPD (chronic obstructive pulmonary disease)     History   Social History  . Marital Status: Widowed    Spouse Name: N/A    Number of Children: N/A  . Years of Education: N/A   Occupational History  . Not on file.   Social History Main Topics  . Smoking status: Former Smoker -- 2.0 packs/day for 30 years    Types: Cigarettes    Quit date: 05/13/1983  . Smokeless tobacco: Never Used  . Alcohol Use: Yes     1-2 glaases wine per day  . Drug Use: No  . Sexually Active: Not on file   Other Topics Concern  . Not on file   Social History Narrative  . No narrative on file    Past Surgical History  Procedure Date  . Abdominal hysterectomy    . Bilateral salpingoophorectomy     s/p prolapsed bladder  . Bladder surgery     prolapsed  . Thoracotomy     granuloma, pulmonary-thoracotomy  . Breast surgery 07-21-07    mastectomy (right), all margins neg and LNs neg   . Appendectomy   . Tonsillectomy   . Rotator cuff repair   . Mastectomy     right  . Breast cyst excision     left  . Mouth surgery     Family History  Problem Relation Age of Onset  . Parkinsonism Father   . Breast cancer      aunt  . Colon cancer      aunt, age 36  . Uterine cancer      aunt  . Coronary artery disease Father   . Stroke Mother   . Heart failure Mother     Allergies  Allergen Reactions  . Penicillins     REACTION: hives  . Prednisone   . Shrimp Flavor     REACTION: burning, itching, throat swelling  . Sulfamethoxazole     REACTION: Nausea/vomiting    Current Outpatient Prescriptions on File Prior to Visit  Medication Sig Dispense Refill  . AMBULATORY  NON FORMULARY MEDICATION Oxygen at bedtime       . aspirin 325 MG tablet Take 325 mg by mouth daily.        . beclomethasone (QVAR) 80 MCG/ACT inhaler Inhale 1 puff into the lungs 2 (two) times daily.  1 Inhaler  0  . diazepam (VALIUM) 5 MG tablet take 1 tablet by mouth at bedtime if needed for sleep  30 tablet  3  . Docusate Calcium (STOOL SOFTENER PO) Take by mouth at bedtime.        . fexofenadine (ALLEGRA) 180 MG tablet Take 1 tablet (180 mg total) by mouth daily.  30 tablet  11  . losartan (COZAAR) 100 MG tablet Take 1 tablet (100 mg total) by mouth daily.  90 tablet  3  . meloxicam (MOBIC) 7.5 MG tablet Take 7.5 mg by mouth daily as needed.       . mometasone (NASONEX) 50 MCG/ACT nasal spray 2 sprays by Nasal route 2 (two) times daily.  17 g  11  . nitroGLYCERIN (NITROSTAT) 0.4 MG SL tablet Place 0.4 mg under the tongue every 5 (five) minutes as needed.        Marland Kitchen omeprazole (PRILOSEC) 20 MG capsule Take 1 capsule (20 mg total) by mouth daily.  90 capsule  3  . Polyethylene  Glycol 1000 POWD Take 1,000 mg by mouth. Takes as needed      . psyllium (METAMUCIL) 58.6 % powder Take 1 teaspoons dissolved in at least 8 ounces of water or juice and drink once daily  1 g  0  . tiotropium (SPIRIVA) 18 MCG inhalation capsule Place 1 capsule (18 mcg total) into inhaler and inhale daily.  90 capsule  3    BP 132/94  Temp(Src) 98.3 F (36.8 C) (Oral)  Wt 129 lb (58.514 kg)  She is allergic to pcn - hives     Objective:   Physical Exam   Constitutional: Appears well-developed and well-nourished. No distress.  Cardiovascular: Normal rate, regular rhythm and normal heart sounds.  Exam reveals no gallop and no friction rub.   No murmur heard. Pulmonary/Chest: Effort normal and breath sounds normal.  No wheezes. No rales.  Abdominal: Soft. Bowel sounds are normal. Suprapubic tenderness. No CVA tenderness Neurological: Alert. No cranial nerve deficit.  Skin: Skin is warm and dry.  Psychiatric: Normal mood and affect. Behavior is normal.       Assessment & Plan:

## 2010-12-25 NOTE — Assessment & Plan Note (Signed)
77 y/o with symptoms of acute cystitis x 1 week. No sign of pyelo.  No fever Treat with cipro 250 bid x 1 wk.  Increase fluids Advised to call back directly if there are further questions, or if these symptoms fail to improve as anticipated or worsen. Advised nursing staff to send urine for culture

## 2010-12-25 NOTE — Telephone Encounter (Signed)
Pt is coming in today at 1:30 for whooping cough inj. Pt is req to get a UA ordered for ?uti. Pt would like to leave urine sample today.

## 2010-12-25 NOTE — Patient Instructions (Signed)
Please call our office if your urinary symptoms persist or gets worse.

## 2010-12-26 NOTE — Telephone Encounter (Signed)
Pt seen Dr Artist Pais for a UTI

## 2010-12-27 ENCOUNTER — Telehealth: Payer: Self-pay | Admitting: *Deleted

## 2010-12-27 LAB — URINE CULTURE: Colony Count: 80000

## 2010-12-27 NOTE — Telephone Encounter (Signed)
She should be ok Urine culture next week

## 2010-12-27 NOTE — Telephone Encounter (Signed)
Make sure she is ok Dr Artist Pais this afternoon if necessary

## 2010-12-27 NOTE — Telephone Encounter (Signed)
[facility] Triage Call Report Triage Record Num: [id number] Operator: Frederico Hamman Patient Name: [name] [name] Call Date & Time: 12/26/2010 5:25:37PM Patient Phone: [phone] PCP: Valetta Mole. [facility] Patient Gender: Female PCP Fax : [phone] Patient DOB: [birth year] Practice Name: [facility] Reason for Call: [name] calling on emergent line and states that she has been taking Ciprofloxin . C/o swollen throat and tongue. Per allergic reaction protocol advised to be seen in ED due to signs of Anaphylaxis-swelling of throat and tongue advised to hang up and call 911.Refuses to call 911. Refuses to go to ED. States "if it gets worse she will call her daughter and go to ED". States she has been drinking water. Explained to [facility] that these are life threatening symptoms that need to be evaluated. Cont to decline 911. Advised to f/u with office in AM if she does not go to ED. Protocol(s) Used: Allergic Reaction, Severe Recommended Outcome per Protocol: Activate EMS 911 Reason for Outcome: Signs/symptoms of anaphylaxis Care Advice: ~ Do not give the patient anything to eat or drink. ~ An adult should stay with the patient, preferably one trained in CPR. ~ IMMEDIATE ACTION Write down provider's name. List or place the following in a bag for transport with the patient: current prescription and/or nonprescription medications; alternative treatments, therapies and medications; and street drugs. ~ If previously prescribed for these symptoms by provider for this person, administer dose of epinephrine (e.g. EpiPen) as directed. ~ 12/26/2010 5:35:38PM Page 1 of 1 [id number]

## 2010-12-27 NOTE — Telephone Encounter (Signed)
Pt. Is doing much better this morning.  Is taking Benadryl and able to eat today.  Stopped Cipro.  Do you want to give her a different antibiotic for the UTI?

## 2010-12-27 NOTE — Telephone Encounter (Signed)
Pt. Has appt the 30th, and will get hr Ur. Culture at that time.  Will call if she develops UTI symptoms.

## 2011-01-10 ENCOUNTER — Ambulatory Visit (INDEPENDENT_AMBULATORY_CARE_PROVIDER_SITE_OTHER): Payer: Medicare Other | Admitting: Internal Medicine

## 2011-01-10 ENCOUNTER — Encounter: Payer: Self-pay | Admitting: Internal Medicine

## 2011-01-10 VITALS — BP 122/82 | HR 72 | Temp 97.6°F | Wt 130.0 lb

## 2011-01-10 DIAGNOSIS — N390 Urinary tract infection, site not specified: Secondary | ICD-10-CM

## 2011-01-10 DIAGNOSIS — I10 Essential (primary) hypertension: Secondary | ICD-10-CM

## 2011-01-10 DIAGNOSIS — I2510 Atherosclerotic heart disease of native coronary artery without angina pectoris: Secondary | ICD-10-CM

## 2011-01-10 DIAGNOSIS — F411 Generalized anxiety disorder: Secondary | ICD-10-CM

## 2011-01-10 LAB — POCT URINALYSIS DIPSTICK
Blood, UA: NEGATIVE
Glucose, UA: NEGATIVE
Nitrite, UA: NEGATIVE
Spec Grav, UA: 1.01
Urobilinogen, UA: 0.2
pH, UA: 7

## 2011-01-10 MED ORDER — OMEPRAZOLE 20 MG PO CPDR: 20.0000 mg | DELAYED_RELEASE_CAPSULE | Freq: Every day | ORAL | Status: DC

## 2011-01-10 NOTE — Progress Notes (Signed)
  Subjective:    Patient ID: [name] [name], female    DOB: 1924-03-06, 77 y.o.   MRN: [id number]  HPI  Reviewed Drs. Artist Pais and Wright's notes. She has trouble staying on track regarding her medical issues. She is much more interested in talking about selling her home and the stress/anxiety related to selling her home.  htn---tolerating meds  GERD---tolerating PPI  Anxiety---doing reasonably well  Recent uti---sxs resolved.  Past Medical History  Diagnosis Date  . ANXIETY 08/16/2008  . CAD, UNSPECIFIED SITE 10/17/2008  . CHOLELITHIASIS 08/23/2008  . COPD 01/04/2010    FeV1 101%, DLCO64% 2008  . 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 DIVERTICULOSIS-COLON 04/07/2008  . GERD 04/19/2007  . HYPERSOMNIA, ASSOCIATED WITH SLEEP APNEA 02/01/2007  . HYPERTENSION 11/12/2006  . MITRAL VALVE PROLAPSE, HX OF 10/07/2008  . NEOPLASM, MALIGNANT, BREAST, RIGHT 02/15/2008  . OSTEOPOROSIS 04/04/2008  . Raynaud's syndrome 04/25/2009  . Atrial fibrillation   . Airway obstruction   . History of diverticulitis of colon   . Tortuous colon   . Anal stricture   . Chronic constipation   . Fatty liver   . Hiatal hernia   . Esophageal stricture   . IBS (irritable bowel syndrome)   . Pulmonary fibrosis   . Sleep apnea   . COPD (chronic obstructive pulmonary disease)    Past Surgical History  Procedure Date  . Abdominal hysterectomy   . Bilateral salpingoophorectomy     s/p prolapsed bladder  . Bladder surgery     prolapsed  . Thoracotomy     granuloma, pulmonary-thoracotomy  . Breast surgery 07-21-07    mastectomy (right), all margins neg and LNs neg   . Appendectomy   . Tonsillectomy   . Rotator cuff repair   . Mastectomy     right  . Breast cyst excision     left  . Mouth surgery     reports that she quit smoking about 27 years ago. Her smoking use included Cigarettes. She has a 60 pack-year smoking history. She has never used smokeless tobacco. She reports that she drinks alcohol. She  reports that she does not use illicit drugs. family history includes Breast cancer in an unspecified family member; Colon cancer in an unspecified family member; Coronary artery disease in her father; Heart failure in her mother; Parkinsonism in her father; Stroke in her mother; and Uterine cancer in an unspecified family member. Allergies  Allergen Reactions  . Ciprofloxacin Swelling  . Penicillins     REACTION: hives  . Prednisone   . Shrimp Flavor     REACTION: burning, itching, throat swelling  . Sulfamethoxazole     REACTION: Nausea/vomiting     Review of Systems  patient denies chest pain, shortness of breath, orthopnea. Denies lower extremity edema, abdominal pain, change in appetite, change in bowel movements. Patient denies rashes, musculoskeletal complaints. No other specific complaints in a complete review of systems.      Objective:   Physical Exam   Well-developed well-nourished female in no acute distress. HEENT exam atraumatic, normocephalic, extraocular muscles are intact. Neck is supple. No jugular venous distention no thyromegaly. Chest clear to auscultation without increased work of breathing. Cardiac exam S1 and S2 are regular. Abdominal exam active bowel sounds, soft, nontender. Extremities no edema. Neurologic exam she is alert without any motor sensory deficits.     Assessment & Plan:

## 2011-01-19 NOTE — Assessment & Plan Note (Signed)
No symptoms. We'll continue to address risk factors. No need for statin therapy.

## 2011-01-19 NOTE — Assessment & Plan Note (Signed)
She uses occasional Valium. She has significant trouble with anxiety at times. She does report that she recently sold her house and that has provided some relief for her.

## 2011-01-19 NOTE — Assessment & Plan Note (Signed)
BP Readings from Last 3 Encounters:  01/10/11 122/82  12/25/10 132/94  12/09/10 120/76   Well-controlled. Continue current medications.

## 2011-01-22 ENCOUNTER — Ambulatory Visit (INDEPENDENT_AMBULATORY_CARE_PROVIDER_SITE_OTHER): Payer: Medicare Other | Admitting: Internal Medicine

## 2011-01-22 ENCOUNTER — Encounter: Payer: Self-pay | Admitting: Internal Medicine

## 2011-01-22 VITALS — BP 122/84 | Temp 98.4°F | Wt 127.0 lb

## 2011-01-22 DIAGNOSIS — N3090 Cystitis, unspecified without hematuria: Secondary | ICD-10-CM

## 2011-01-22 DIAGNOSIS — N390 Urinary tract infection, site not specified: Secondary | ICD-10-CM

## 2011-01-22 DIAGNOSIS — R079 Chest pain, unspecified: Secondary | ICD-10-CM

## 2011-01-22 DIAGNOSIS — R0789 Other chest pain: Secondary | ICD-10-CM

## 2011-01-22 LAB — POCT URINALYSIS DIPSTICK
Glucose, UA: NEGATIVE
Ketones, UA: NEGATIVE
Protein, UA: NEGATIVE
Spec Grav, UA: 1.01
Urobilinogen, UA: 0.2

## 2011-01-22 MED ORDER — CEFUROXIME AXETIL 250 MG PO TABS: 250.0000 mg | ORAL_TABLET | Freq: Two times a day (BID) | ORAL | Status: AC

## 2011-01-22 NOTE — Assessment & Plan Note (Signed)
Treat with cefuroxime 250 mg twice a day x7 days.  I am aware of her penicillin allergy (hives only) She was not able to tolerate Cipro in the past

## 2011-01-22 NOTE — Patient Instructions (Signed)
Call our office if your chest tightness gets worse.

## 2011-01-22 NOTE — Progress Notes (Signed)
Subjective:    Patient ID: [name] [name], female    DOB: 11/25/1923, 77 y.o.   MRN: [id number]  HPI  77 y/o female complains of dysuria.  She was initially seen on 12/25/2010 for similar complaints. She was prescribed Cipro but she could not tolerate 2 to complaints of throat swelling. She increased her fluids but did not take a different antibiotic.  Urine culture from 8/15  showed (S.AGALACTIAE).  She is allergic to pcn - hives  Patient complains she has had on and off dysuria which has recently worsened. She denies fevers or rigors.  She denies back pain.  Patient also complains of chest tightness in the morning. She has history of COPD and is followed by Dr. Delford Field. Chest history of coronary disease and is followed by Dr. Daleen Squibb.  Her chest pain does not radiate. It is substernal. She has mild associated shortness of breath. Chest pain resolves in one or 2 hours. She attributes symptoms to increased stress.  She is in the middle of selling her house and moving.  Review of Systems See HPI    Past Medical History  Diagnosis Date  . ANXIETY 08/16/2008  . CAD, UNSPECIFIED SITE 10/17/2008  . CHOLELITHIASIS 08/23/2008  . COPD 01/04/2010    FeV1 101%, DLCO64% 2008  . 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 DIVERTICULOSIS-COLON 04/07/2008  . GERD 04/19/2007  . HYPERSOMNIA, ASSOCIATED WITH SLEEP APNEA 02/01/2007  . HYPERTENSION 11/12/2006  . MITRAL VALVE PROLAPSE, HX OF 10/07/2008  . NEOPLASM, MALIGNANT, BREAST, RIGHT 02/15/2008  . OSTEOPOROSIS 04/04/2008  . Raynaud's syndrome 04/25/2009  . Atrial fibrillation   . Airway obstruction   . History of diverticulitis of colon   . Tortuous colon   . Anal stricture   . Chronic constipation   . Fatty liver   . Hiatal hernia   . Esophageal stricture   . IBS (irritable bowel syndrome)   . Pulmonary fibrosis   . Sleep apnea   . COPD (chronic obstructive pulmonary disease)     History   Social History  . Marital Status: Widowed    Spouse  Name: N/A    Number of Children: N/A  . Years of Education: N/A   Occupational History  . Not on file.   Social History Main Topics  . Smoking status: Former Smoker -- 2.0 packs/day for 30 years    Types: Cigarettes    Quit date: 05/13/1983  . Smokeless tobacco: Never Used  . Alcohol Use: Yes     1-2 glaases wine per day  . Drug Use: No  . Sexually Active: Not on file   Other Topics Concern  . Not on file   Social History Narrative  . No narrative on file    Past Surgical History  Procedure Date  . Abdominal hysterectomy   . Bilateral salpingoophorectomy     s/p prolapsed bladder  . Bladder surgery     prolapsed  . Thoracotomy     granuloma, pulmonary-thoracotomy  . Breast surgery 07-21-07    mastectomy (right), all margins neg and LNs neg   . Appendectomy   . Tonsillectomy   . Rotator cuff repair   . Mastectomy     right  . Breast cyst excision     left  . Mouth surgery     Family History  Problem Relation Age of Onset  . Parkinsonism Father   . Breast cancer      aunt  . Colon cancer  aunt, age 2  . Uterine cancer      aunt  . Coronary artery disease Father   . Stroke Mother   . Heart failure Mother     Allergies  Allergen Reactions  . Ciprofloxacin Swelling  . Penicillins     REACTION: hives  . Prednisone   . Shrimp Flavor     REACTION: burning, itching, throat swelling  . Sulfamethoxazole     REACTION: Nausea/vomiting    Current Outpatient Prescriptions on File Prior to Visit  Medication Sig Dispense Refill  . AMBULATORY NON FORMULARY MEDICATION Oxygen at bedtime       . aspirin 325 MG tablet Take 325 mg by mouth daily.        . beclomethasone (QVAR) 80 MCG/ACT inhaler Inhale 1 puff into the lungs 2 (two) times daily.  1 Inhaler  0  . diazepam (VALIUM) 5 MG tablet take 1 tablet by mouth at bedtime if needed for sleep  30 tablet  3  . Docusate Calcium (STOOL SOFTENER PO) Take by mouth at bedtime.        . fexofenadine (ALLEGRA) 180  MG tablet Take 1 tablet (180 mg total) by mouth daily.  30 tablet  11  . losartan (COZAAR) 100 MG tablet Take 1 tablet (100 mg total) by mouth daily.  90 tablet  3  . meloxicam (MOBIC) 7.5 MG tablet Take 7.5 mg by mouth daily as needed.       . mometasone (NASONEX) 50 MCG/ACT nasal spray 2 sprays by Nasal route 2 (two) times daily.  17 g  11  . nitroGLYCERIN (NITROSTAT) 0.4 MG SL tablet Place 0.4 mg under the tongue every 5 (five) minutes as needed.        Marland Kitchen omeprazole (PRILOSEC) 20 MG capsule Take 1 capsule (20 mg total) by mouth daily.  14 capsule  0  . tiotropium (SPIRIVA) 18 MCG inhalation capsule Place 1 capsule (18 mcg total) into inhaler and inhale daily.  90 capsule  3  . Polyethylene Glycol 1000 POWD Take 1,000 mg by mouth. Takes as needed      . psyllium (METAMUCIL) 58.6 % powder Take 1 teaspoons dissolved in at least 8 ounces of water or juice and drink once daily  1 g  0    BP 122/84  Temp(Src) 98.4 F (36.9 C) (Oral)  Wt 127 lb (57.607 kg)  EKG shows NSR at 66 bpm.  No ST changes.  1st degree AV block   Objective:   Physical Exam   Constitutional: Appears well-developed and well-nourished. No distress.  Neck: Normal range of motion. Neck supple. No thyromegaly present. No carotid bruit Cardiovascular: Normal rate, regular rhythm and normal heart sounds.  Exam reveals no gallop and no friction rub.   No murmur heard. Pulmonary/Chest: Effort normal and breath sounds normal.  No wheezes. No rales.  Abdominal: Soft. Mild suprapubic tenderness. No flank tenderness Neurological: Alert. No cranial nerve deficit.  Ext:  No lower extremity edema or calf tenderness Skin: Skin is warm and dry.  Psychiatric: Normal mood and affect. Behavior is normal.        Assessment & Plan:

## 2011-01-22 NOTE — Assessment & Plan Note (Signed)
77 year old female with atypical chest pain. EKG shows normal sinus rhythm at 66 beats per minute with first degree AV block. Negative for ST changes. Her symptoms are likely related to stress from moving and selling her house. She defers referral to cardiologist for now. Patient advised to call office if symptoms persist or worsen.

## 2011-01-23 ENCOUNTER — Ambulatory Visit: Payer: Medicare Other | Admitting: Internal Medicine

## 2011-02-03 LAB — PROTIME-INR
INR: 0.9
Prothrombin Time: 12.7

## 2011-02-03 LAB — COMPREHENSIVE METABOLIC PANEL
BUN: 8
CO2: 30
Calcium: 9.9
Chloride: 98
Creatinine, Ser: 0.88
GFR calc non Af Amer: >60
Total Bilirubin: 1

## 2011-02-03 LAB — DIFFERENTIAL
Basophils Absolute: 0
Lymphocytes Relative: 25
Lymphs Abs: 1.9
Neutro Abs: 5.1
Neutrophils Relative %: 67

## 2011-02-03 LAB — CBC
HCT: 45
MCHC: 34.2
MCV: 91.3
RBC: 4.93
WBC: 7.7

## 2011-02-03 LAB — APTT: aPTT: 29

## 2011-02-06 ENCOUNTER — Ambulatory Visit (INDEPENDENT_AMBULATORY_CARE_PROVIDER_SITE_OTHER): Payer: Medicare Other | Admitting: Family Medicine

## 2011-02-06 ENCOUNTER — Encounter: Payer: Self-pay | Admitting: Family Medicine

## 2011-02-06 VITALS — BP 120/70 | Temp 97.7°F | Wt 127.0 lb

## 2011-02-06 DIAGNOSIS — Z23 Encounter for immunization: Secondary | ICD-10-CM

## 2011-02-06 DIAGNOSIS — S51819A Laceration without foreign body of unspecified forearm, initial encounter: Secondary | ICD-10-CM

## 2011-02-06 DIAGNOSIS — S51809A Unspecified open wound of unspecified forearm, initial encounter: Secondary | ICD-10-CM

## 2011-02-06 NOTE — Progress Notes (Signed)
  Subjective:    Patient ID: [name] [name], female    DOB: 04-11-1924, 77 y.o.   MRN: [id number]  HPI Acute visit. Left arm injury. Occurred 3 days ago while moving. She was pulling a box and left arm hit against a shelf. Last tetanus one month ago. Denies other injury. Intermittent bleeding since then. Fragile skin. Cleaning with soap and water and daily topical antibiotic. Denies any history of dizziness or syncope. No falls. Requesting flu vaccine. No contraindications   Review of Systems  Constitutional: Negative for fever and chills.  Respiratory: Negative for cough and shortness of breath.   Cardiovascular: Negative for chest pain.  Musculoskeletal: Negative for arthralgias.       Objective:   Physical Exam  Constitutional: She appears well-developed and well-nourished. No distress.  Cardiovascular: Normal rate and regular rhythm.   Pulmonary/Chest: Effort normal and breath sounds normal. No respiratory distress. She has no wheezes. She has no rales.  Musculoskeletal:       Left forearm 2 cm laceration. Very fragile skin. Minimal ecchymosis. No cellulitis changes. No warmth or erythema. No drainage.          Assessment & Plan:  Laceration left forearm. Wound care instruction given. Tetanus up-to-date. Followup for secondary infection. Flu vaccine given

## 2011-02-06 NOTE — Patient Instructions (Signed)
Clean laceration daily with soap and water. Continued daily application of topical antibiotic and dressing for the next 3-4 days Follow up promptly for signs of secondary infection such as redness, warmth, or puslike drainage.

## 2011-02-14 LAB — D-DIMER, QUANTITATIVE: D-Dimer, Quant: 0.35 ug/mL-FEU (ref 0.00–0.48)

## 2011-02-14 LAB — CARDIAC PANEL(CRET KIN+CKTOT+MB+TROPI)
CK, MB: 1.7 ng/mL (ref 0.3–4.0)
CK, MB: 1.9 ng/mL (ref 0.3–4.0)
Relative Index: INVALID (ref 0.0–2.5)
Total CK: 53 U/L (ref 7–177)
Troponin I: <0.01 ng/mL (ref 0.00–0.06)
Troponin I: <0.01 ng/mL (ref 0.00–0.06)

## 2011-02-14 LAB — CBC
MCHC: 33.6 g/dL (ref 30.0–36.0)
MCV: 93.1 fL (ref 78.0–100.0)
RBC: 4.59 MIL/uL (ref 3.87–5.11)
RDW: 13.1 % (ref 11.5–15.5)

## 2011-02-14 LAB — BASIC METABOLIC PANEL
CO2: 27 mEq/L (ref 19–32)
Calcium: 9 mg/dL (ref 8.4–10.5)
Creatinine, Ser: 1 mg/dL (ref 0.4–1.2)
GFR calc Af Amer: >60 mL/min (ref >60)
GFR calc non Af Amer: 53 mL/min — ABNORMAL LOW (ref >60)
Sodium: 137 mEq/L (ref 135–145)

## 2011-02-21 ENCOUNTER — Encounter: Payer: Self-pay | Admitting: Gastroenterology

## 2011-02-22 ENCOUNTER — Other Ambulatory Visit: Payer: Self-pay | Admitting: Internal Medicine

## 2011-02-23 ENCOUNTER — Inpatient Hospital Stay (HOSPITAL_COMMUNITY)
Admission: EM | Admit: 2011-02-23 | Discharge: 2011-03-07 | DRG: 394 | Disposition: A | Discharge status: Skilled Nursing Facility | Payer: Medicare Other | Attending: Internal Medicine | Admitting: Internal Medicine

## 2011-02-23 ENCOUNTER — Emergency Department (HOSPITAL_COMMUNITY): Payer: Medicare Other

## 2011-02-23 DIAGNOSIS — R5381 Other malaise: Secondary | ICD-10-CM | POA: Diagnosis present

## 2011-02-23 DIAGNOSIS — G4733 Obstructive sleep apnea (adult) (pediatric): Secondary | ICD-10-CM | POA: Diagnosis present

## 2011-02-23 DIAGNOSIS — J449 Chronic obstructive pulmonary disease, unspecified: Secondary | ICD-10-CM | POA: Diagnosis present

## 2011-02-23 DIAGNOSIS — J392 Other diseases of pharynx: Secondary | ICD-10-CM | POA: Diagnosis present

## 2011-02-23 DIAGNOSIS — J9610 Chronic respiratory failure, unspecified whether with hypoxia or hypercapnia: Secondary | ICD-10-CM | POA: Diagnosis present

## 2011-02-23 DIAGNOSIS — Z853 Personal history of malignant neoplasm of breast: Secondary | ICD-10-CM

## 2011-02-23 DIAGNOSIS — K5289 Other specified noninfective gastroenteritis and colitis: Secondary | ICD-10-CM | POA: Diagnosis present

## 2011-02-23 DIAGNOSIS — J4489 Other specified chronic obstructive pulmonary disease: Secondary | ICD-10-CM | POA: Diagnosis present

## 2011-02-23 DIAGNOSIS — K5730 Diverticulosis of large intestine without perforation or abscess without bleeding: Secondary | ICD-10-CM | POA: Diagnosis present

## 2011-02-23 DIAGNOSIS — Z9981 Dependence on supplemental oxygen: Secondary | ICD-10-CM

## 2011-02-23 DIAGNOSIS — Z87891 Personal history of nicotine dependence: Secondary | ICD-10-CM

## 2011-02-23 DIAGNOSIS — K6289 Other specified diseases of anus and rectum: Principal | ICD-10-CM | POA: Diagnosis present

## 2011-02-23 DIAGNOSIS — B3781 Candidal esophagitis: Secondary | ICD-10-CM | POA: Diagnosis present

## 2011-02-23 DIAGNOSIS — E440 Moderate protein-calorie malnutrition: Secondary | ICD-10-CM | POA: Diagnosis not present

## 2011-02-23 DIAGNOSIS — E871 Hypo-osmolality and hyponatremia: Secondary | ICD-10-CM | POA: Diagnosis present

## 2011-02-23 DIAGNOSIS — R609 Edema, unspecified: Secondary | ICD-10-CM | POA: Diagnosis present

## 2011-02-23 DIAGNOSIS — D72829 Elevated white blood cell count, unspecified: Secondary | ICD-10-CM | POA: Diagnosis present

## 2011-02-23 DIAGNOSIS — Z902 Acquired absence of lung [part of]: Secondary | ICD-10-CM

## 2011-02-23 DIAGNOSIS — Z880 Allergy status to penicillin: Secondary | ICD-10-CM

## 2011-02-23 DIAGNOSIS — I10 Essential (primary) hypertension: Secondary | ICD-10-CM | POA: Diagnosis present

## 2011-02-23 DIAGNOSIS — K5900 Constipation, unspecified: Secondary | ICD-10-CM | POA: Diagnosis present

## 2011-02-23 DIAGNOSIS — Z882 Allergy status to sulfonamides status: Secondary | ICD-10-CM

## 2011-02-23 DIAGNOSIS — Z9010 Acquired absence of unspecified breast and nipple: Secondary | ICD-10-CM

## 2011-02-23 DIAGNOSIS — Z79899 Other long term (current) drug therapy: Secondary | ICD-10-CM

## 2011-02-23 LAB — CBC
HCT: 41.9 % (ref 36.0–46.0)
Hemoglobin: 15 g/dL (ref 12.0–15.0)
MCV: 85.2 fL (ref 78.0–100.0)
RBC: 4.92 MIL/uL (ref 3.87–5.11)
RDW: 12.7 % (ref 11.5–15.5)
WBC: 18.5 10*3/uL — ABNORMAL HIGH (ref 4.0–10.5)

## 2011-02-23 LAB — BASIC METABOLIC PANEL
BUN: 11 mg/dL (ref 6–23)
Chloride: 92 mEq/L — ABNORMAL LOW (ref 96–112)
GFR calc non Af Amer: 81 mL/min — ABNORMAL LOW (ref >90)
Glucose, Bld: 115 mg/dL — ABNORMAL HIGH (ref 70–99)
Potassium: 4.8 mEq/L (ref 3.5–5.1)
Sodium: 130 mEq/L — ABNORMAL LOW (ref 135–145)

## 2011-02-23 LAB — DIFFERENTIAL
Basophils Relative: 0 % (ref 0–1)
Eosinophils Absolute: 0 10*3/uL (ref 0.0–0.7)
Neutrophils Relative %: 90 % — ABNORMAL HIGH (ref 43–77)

## 2011-02-23 LAB — POCT I-STAT, CHEM 8
Calcium, Ion: 1.09 mmol/L — ABNORMAL LOW (ref 1.12–1.32)
Chloride: 95 mEq/L — ABNORMAL LOW (ref 96–112)
HCT: 48 % — ABNORMAL HIGH (ref 36.0–46.0)
Sodium: 130 mEq/L — ABNORMAL LOW (ref 135–145)
TCO2: 26 mmol/L (ref 0–100)

## 2011-02-23 LAB — URINALYSIS, ROUTINE W REFLEX MICROSCOPIC
Bilirubin Urine: NEGATIVE
Glucose, UA: NEGATIVE mg/dL
Hgb urine dipstick: NEGATIVE
Specific Gravity, Urine: 1.015 (ref 1.005–1.030)
Urobilinogen, UA: 1 mg/dL (ref 0.0–1.0)
pH: 7.5 (ref 5.0–8.0)

## 2011-02-23 MED ORDER — IOHEXOL 300 MG/ML  SOLN
100.0000 mL | Freq: Once | INTRAMUSCULAR | Status: AC | PRN
  Administered 2011-02-23: 100 mL via INTRAVENOUS

## 2011-02-24 ENCOUNTER — Telehealth: Payer: Self-pay | Admitting: *Deleted

## 2011-02-24 DIAGNOSIS — K5900 Constipation, unspecified: Secondary | ICD-10-CM

## 2011-02-24 DIAGNOSIS — R1033 Periumbilical pain: Secondary | ICD-10-CM

## 2011-02-24 DIAGNOSIS — R933 Abnormal findings on diagnostic imaging of other parts of digestive tract: Secondary | ICD-10-CM

## 2011-02-24 LAB — CBC
Hemoglobin: 13.5 g/dL (ref 12.0–15.0)
Platelets: 175 10*3/uL (ref 150–400)
RBC: 4.5 MIL/uL (ref 3.87–5.11)
WBC: 16.6 10*3/uL — ABNORMAL HIGH (ref 4.0–10.5)

## 2011-02-24 LAB — DIFFERENTIAL
Basophils Relative: 0 % (ref 0–1)
Eosinophils Absolute: 0 10*3/uL (ref 0.0–0.7)
Lymphs Abs: 1.2 10*3/uL (ref 0.7–4.0)
Neutro Abs: 14.3 10*3/uL — ABNORMAL HIGH (ref 1.7–7.7)
Neutrophils Relative %: 86 % — ABNORMAL HIGH (ref 43–77)

## 2011-02-24 LAB — MAGNESIUM: Magnesium: 2 mg/dL (ref 1.5–2.5)

## 2011-02-24 LAB — URINE CULTURE
Colony Count: NO GROWTH
Culture  Setup Time: 201210142111

## 2011-02-24 LAB — CANCER ANTIGEN 19-9: CA 19-9: 8 U/mL — ABNORMAL LOW (ref <35.0)

## 2011-02-24 NOTE — H&P (Signed)
NAMEJOSEPHENE, MARRONE NO.:  [id number]  MEDICAL RECORD NO.:  [id number]  LOCATION:  5012                         FACILITY:  [facility]  PHYSICIAN:  Talmage Nap, MD  DATE OF BIRTH:  Mar 10, 1924  DATE OF ADMISSION:  02/23/2011 DATE OF DISCHARGE:                             HISTORY & PHYSICAL   PRIMARY PULMONOLOGIST:  Charlcie Cradle. Delford Field, MD, [facility]  PRIMARY CARE PHYSICIAN:  Valetta Mole. Swords, MD  PRIMARY CARDIOLOGIST:  Jesse Sans. Daleen Squibb, MD, [facility]  PRIMARY GASTROENTEROLOGIST:  Everardo Beals Juanda Chance, MD, [facility]  History obtainable from the patient and the patient's daughter.  CHIEF COMPLAINT:  Abdominal pain of about 10 days duration.  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 The patient was said to have had difficulty in defecation over the years, however, when she moved over to the retirement home, her symptom was said to have been getting progressively worse.  It initially started as lower abdominal pain which the patient described as stabbing in nature with associated difficulty in defecating.  The patient claimed that her last defecation was about 10 days ago.  She claims she was nauseated, but denied any vomiting.  She also claims she had difficulty in trying to void urine but denies any hematuria.  The patient claims she has used different laxatives as she was having extreme difficulty in trying to defecate. She also gave a history of about 10 pounds weight loss over the past 3 months.  According to patient's daughter, this in part was attributable to the fact that the patient has not been eating well, especially when she was living alone and this got progressively worse  when she moved to the retirement home.  The lower abdominal pain was said to be getting progressively worse with extreme difficulty in defecating and with perirectal pain and subsequently brought to the emergency room to be evaluated.  PAST MEDICAL HISTORY: 1. Positive for COPD. 2. Chronic respiratory failure on home O2. 3. Hypertension. 4. Sleep apnea. 5. Breast CA. 6. Hypopharyngeal stenosis.  PAST SURGICAL HISTORY: 1. Hysterectomy. 2. Bladder prolapse status post bladder tack. 3. Rectal colonoscopy. 4. Left lobectomy secondary to lung mass and biopsy according to the     patient was said to be benign.  Her preadmission med without dosages include: 1. Aleve. 2. Diazepam. 3. Losartan. 4. Nasonex. 5. Prilosec. 6. QVAR. 7. Spiriva HandiHaler.  ALLERGIES:  PENICILLIN and SULFA.  SOCIAL HISTORY:  Negative for alcohol or tobacco use, and the patient lives in a retirement home.  FAMILY HISTORY:  The patient's side is negative for GI malignancy, however, on the side of her spouse said to be positive for diabetes mellitus, hypertension, and coronary artery disease.  REVIEW OF SYSTEMS:  The patient denies any history of headaches. Complained about nausea but no vomiting.  She denies any fever, no chills, no rigor.  No chest pain  or shortness of breath.  Denies any cough. Complained about pain in the lower abdomen, which she described as stabbing nature with associated difficulty in defecating and also void urine.  She denies any dysuria or hematuria.  Complained of chronic constipation the past 10-14 days.  No intolerance to heat or cold and no neuropsychiatric disorder.  PHYSICAL EXAMINATION:  GENERAL:  A very pleasant elderly lady in mild painful distress but not in any respiratory distress with suboptimal hydration at present. VITAL SIGNS:  Blood pressure is 152/70, pulse is 77, respiratory rate 22, temperature is 99. HEENT:  Pupils are reactive to light  and extraocular muscles are intact. NECK:  No jugular venous distention.  No carotid bruit.  No lymphadenopathy. CHEST:  Clear to auscultation. HEART:  Sounds are 1 and 2. ABDOMEN:  Soft, slightly distended at the suprapubic region.  Liver, spleen, kidney not palpable.  Bowel sounds are hypoactive. EXTREMITIES:  No pedal edema. NEUROLOGIC:  Nonfocal. MUSCULOSKELETAL SYSTEM:  Arthritic changes in the knees and in the feet. NEUROPSYCHIATRIC:  Evaluation is unremarkable. SKIN:  Decreased turgor.  LABORATORY DATA:  Chemistry showed sodium of 130, potassium of 4.8, chloride is 92, bicarb is 27, glucose is 105, BUN is 11.  Urine microscopy unremarkable.  Complete blood count differential showed WBC of 18.5, hemoglobin of 10.0, hematocrit of 41.9, MCV of 85.2 with a platelet count of 177,000, neutrophils 90, and absolute neutrophil count is 16.7.   Imaging studies done on the patient include CT of the abdomen and pelvis with contrast which showed large amount of stool in the colon with associated perirectal edema suspicious for proctitis. There is no evidence of perforation or intraabdominal abscess.  There is indeterminate low-density lesion involving the upper pole of the right kidney which appears new from CT that was done on June 22, 2009. Acute abdominal series was essentially unremarkable.  IMPRESSION: 1. Chronic constipation, rule out colonic malignancy 2. Perirectal edema, questionable proctitis. 3. Chronic obstructive pulmonary disease. 4. Respiratory failure on home O2. 5. History of hypopharyngeal stenosis - difficulty with intubation. 6. Obstructive sleep apnea. 7. Hypertension. 8. History of breast cancer.  PLAN:  Admit the patient to general medical floor.  The patient will be slowly rehydrated with normal saline IV to go at rate of 75 mL an hour. Pain control would be done with Dilaudid 1 mg IV q.4 p.r.n.  The patient will be on Colace 100 mg p.o. b.i.d. and  Dulcolax 10 mg p.o. b.i.d.  For the patient's blood pressure, this should be maintained on losartan 50 mg p.o. daily and she will also be on albuterol and Atrovent nebs q.6 p.r.n. for shortness of breath.  She will continue on O2 via nasal cannula 3 liters per minute at night.  Because of perirectal edema with questionable proctitis with leukocytosis and left shift, the patient empirically will be given Cipro 400 mg IV q.12 and Flagyl 500 mg IV q.8 hourly.  Bladder catheter will be left in situ.  GI prophylaxis will be done with Protonix 40 mg IV q.24 and DVT prophylaxis with TED stockings. Further workup to be done on this patient will include assay  tumor markers and this will include serum CEA level, CA-125, and CA 19-9. CBC, CMP, and magnesium will be repeated in the a.m.  Finally, the patient'S GI physician, Dr. Juanda Chance will be consulted in a.m. for further evaluation of this patient.  The patient will be evaluated on day-to-day basis.     Talmage Nap, MD  CN/MEDQ  D:  02/23/2011  T:  02/23/2011  Job:  [id number]  Electronically Signed by Talmage Nap  on 02/24/2011 02:30:56 AM

## 2011-02-24 NOTE — Telephone Encounter (Signed)
[facility] Triage Call Report Triage Record Num: [id number] Operator: Freddie Breech Patient Name: [name] [name] Call Date & Time: 02/23/2011 12:47:53PM Patient Phone: [phone] PCP: Valetta Mole. Swords Patient Gender: Female PCP Fax : [phone] Patient DOB: 1924/01/01 Practice Name: [facility] Reason for Call: Pt c/o unable urinate at all this AM, unbearable abd pain. Advised 911 now per Abd Pain Protocol. Protocol(s) Used: Abdominal Pain Recommended Outcome per Protocol: Activate EMS 911 Reason for Outcome: Over 26 years of age AND new onset (first episode) unbearable back or abdominal pain Care Advice: ~ Do not give the patient anything to eat or drink. ~ Do not push on abdomen. ~ An adult should stay with the patient, preferably one trained in CPR. Write down provider's name. List or place the following in a bag for transport with the patient: current prescription and/or nonprescription medications; alternative treatments, therapies and medications; and street drugs. ~ 02/23/2011 12:54:20PM Page 1 of 1 [email or web address]

## 2011-02-25 ENCOUNTER — Inpatient Hospital Stay (HOSPITAL_COMMUNITY): Payer: Medicare Other

## 2011-02-25 DIAGNOSIS — R1033 Periumbilical pain: Secondary | ICD-10-CM

## 2011-02-25 DIAGNOSIS — K5900 Constipation, unspecified: Secondary | ICD-10-CM

## 2011-02-25 DIAGNOSIS — R933 Abnormal findings on diagnostic imaging of other parts of digestive tract: Secondary | ICD-10-CM

## 2011-02-25 LAB — CBC
HCT: 39 % (ref 36.0–46.0)
MCH: 29.8 pg (ref 26.0–34.0)
MCHC: 34.1 g/dL (ref 30.0–36.0)
MCV: 87.4 fL (ref 78.0–100.0)
RDW: 13.1 % (ref 11.5–15.5)

## 2011-02-25 LAB — BASIC METABOLIC PANEL
BUN: 8 mg/dL (ref 6–23)
Calcium: 8.8 mg/dL (ref 8.4–10.5)
Creatinine, Ser: 0.72 mg/dL (ref 0.50–1.10)
GFR calc Af Amer: 88 mL/min — ABNORMAL LOW (ref >90)
GFR calc non Af Amer: 76 mL/min — ABNORMAL LOW (ref >90)

## 2011-02-26 ENCOUNTER — Other Ambulatory Visit: Payer: Self-pay | Admitting: Internal Medicine

## 2011-02-26 ENCOUNTER — Inpatient Hospital Stay (HOSPITAL_COMMUNITY): Payer: Medicare Other

## 2011-02-26 ENCOUNTER — Ambulatory Visit: Payer: Medicare Other | Admitting: Critical Care Medicine

## 2011-02-26 LAB — CBC
HCT: 36.4 % (ref 36.0–46.0)
MCHC: 34.1 g/dL (ref 30.0–36.0)
Platelets: 149 10*3/uL — ABNORMAL LOW (ref 150–400)
RDW: 13.1 % (ref 11.5–15.5)

## 2011-02-26 LAB — BASIC METABOLIC PANEL
BUN: 7 mg/dL (ref 6–23)
GFR calc Af Amer: >90 mL/min (ref >90)
GFR calc non Af Amer: 79 mL/min — ABNORMAL LOW (ref >90)
Potassium: 4 mEq/L (ref 3.5–5.1)
Sodium: 128 mEq/L — ABNORMAL LOW (ref 135–145)

## 2011-02-27 ENCOUNTER — Encounter: Payer: Self-pay | Admitting: Internal Medicine

## 2011-02-27 ENCOUNTER — Encounter (HOSPITAL_COMMUNITY): Payer: Self-pay | Admitting: Anatomic Pathology & Clinical Pathology

## 2011-02-27 LAB — CBC
Hemoglobin: 11.6 g/dL — ABNORMAL LOW (ref 12.0–15.0)
MCHC: 34 g/dL (ref 30.0–36.0)
WBC: 6.8 10*3/uL (ref 4.0–10.5)

## 2011-02-27 LAB — BASIC METABOLIC PANEL
GFR calc Af Amer: >90 mL/min (ref >90)
GFR calc non Af Amer: 80 mL/min — ABNORMAL LOW (ref >90)
Glucose, Bld: 123 mg/dL — ABNORMAL HIGH (ref 70–99)
Potassium: 3.4 mEq/L — ABNORMAL LOW (ref 3.5–5.1)
Sodium: 131 mEq/L — ABNORMAL LOW (ref 135–145)

## 2011-02-27 LAB — LACTATE DEHYDROGENASE: LDH: 134 U/L (ref 94–250)

## 2011-02-28 ENCOUNTER — Encounter (HOSPITAL_COMMUNITY): Payer: Self-pay | Admitting: Radiology

## 2011-02-28 ENCOUNTER — Inpatient Hospital Stay (HOSPITAL_COMMUNITY): Payer: Medicare Other

## 2011-02-28 ENCOUNTER — Other Ambulatory Visit (HOSPITAL_COMMUNITY): Payer: Medicare Other

## 2011-02-28 DIAGNOSIS — K5900 Constipation, unspecified: Secondary | ICD-10-CM

## 2011-02-28 DIAGNOSIS — R1033 Periumbilical pain: Secondary | ICD-10-CM

## 2011-02-28 DIAGNOSIS — R933 Abnormal findings on diagnostic imaging of other parts of digestive tract: Secondary | ICD-10-CM

## 2011-02-28 LAB — DIFFERENTIAL
Basophils Relative: 0 % (ref 0–1)
Eosinophils Absolute: 0.4 10*3/uL (ref 0.0–0.7)
Lymphs Abs: 1.6 10*3/uL (ref 0.7–4.0)
Neutrophils Relative %: 61 % (ref 43–77)

## 2011-02-28 LAB — BASIC METABOLIC PANEL
CO2: 26 mEq/L (ref 19–32)
Chloride: 96 mEq/L (ref 96–112)
Sodium: 131 mEq/L — ABNORMAL LOW (ref 135–145)

## 2011-02-28 LAB — CBC
Platelets: 197 10*3/uL (ref 150–400)
RBC: 4.32 MIL/uL (ref 3.87–5.11)
WBC: 7.5 10*3/uL (ref 4.0–10.5)

## 2011-02-28 MED ORDER — IOHEXOL 350 MG/ML SOLN
100.0000 mL | Freq: Once | INTRAVENOUS | Status: AC | PRN
  Administered 2011-02-28: 100 mL via INTRAVENOUS

## 2011-03-01 DIAGNOSIS — K5900 Constipation, unspecified: Secondary | ICD-10-CM

## 2011-03-01 DIAGNOSIS — R1033 Periumbilical pain: Secondary | ICD-10-CM

## 2011-03-01 DIAGNOSIS — R933 Abnormal findings on diagnostic imaging of other parts of digestive tract: Secondary | ICD-10-CM

## 2011-03-01 LAB — BASIC METABOLIC PANEL
GFR calc Af Amer: >90 mL/min (ref >90)
GFR calc non Af Amer: 81 mL/min — ABNORMAL LOW (ref >90)
Potassium: 4 mEq/L (ref 3.5–5.1)
Sodium: 131 mEq/L — ABNORMAL LOW (ref 135–145)

## 2011-03-02 ENCOUNTER — Other Ambulatory Visit: Payer: Self-pay | Admitting: Internal Medicine

## 2011-03-02 DIAGNOSIS — K5289 Other specified noninfective gastroenteritis and colitis: Secondary | ICD-10-CM

## 2011-03-02 LAB — STOOL CULTURE

## 2011-03-03 DIAGNOSIS — K5730 Diverticulosis of large intestine without perforation or abscess without bleeding: Secondary | ICD-10-CM

## 2011-03-03 LAB — DIFFERENTIAL
Basophils Absolute: 0 10*3/uL (ref 0.0–0.1)
Lymphocytes Relative: 18 % (ref 12–46)
Neutro Abs: 5.6 10*3/uL (ref 1.7–7.7)

## 2011-03-03 LAB — COMPREHENSIVE METABOLIC PANEL
ALT: 7 U/L (ref 0–35)
AST: 14 U/L (ref 0–37)
Albumin: 3 g/dL — ABNORMAL LOW (ref 3.5–5.2)
CO2: 28 mEq/L (ref 19–32)
Calcium: 8.8 mg/dL (ref 8.4–10.5)
Chloride: 94 mEq/L — ABNORMAL LOW (ref 96–112)
GFR calc non Af Amer: 82 mL/min — ABNORMAL LOW (ref >90)
Sodium: 130 mEq/L — ABNORMAL LOW (ref 135–145)

## 2011-03-03 LAB — CBC
HCT: 38.7 % (ref 36.0–46.0)
Hemoglobin: 13.4 g/dL (ref 12.0–15.0)
RDW: 12.7 % (ref 11.5–15.5)
WBC: 8.4 10*3/uL (ref 4.0–10.5)

## 2011-03-03 LAB — TRIGLYCERIDES: Triglycerides: 98 mg/dL (ref <150)

## 2011-03-03 LAB — GLUCOSE, CAPILLARY: Glucose-Capillary: 146 mg/dL — ABNORMAL HIGH (ref 70–99)

## 2011-03-03 LAB — CHOLESTEROL, TOTAL: Cholesterol: 158 mg/dL (ref 0–200)

## 2011-03-04 ENCOUNTER — Inpatient Hospital Stay (HOSPITAL_COMMUNITY): Payer: Medicare Other

## 2011-03-04 ENCOUNTER — Encounter: Payer: Self-pay | Admitting: Internal Medicine

## 2011-03-04 DIAGNOSIS — K5900 Constipation, unspecified: Secondary | ICD-10-CM

## 2011-03-04 DIAGNOSIS — R1033 Periumbilical pain: Secondary | ICD-10-CM

## 2011-03-04 DIAGNOSIS — K5730 Diverticulosis of large intestine without perforation or abscess without bleeding: Secondary | ICD-10-CM

## 2011-03-04 DIAGNOSIS — R933 Abnormal findings on diagnostic imaging of other parts of digestive tract: Secondary | ICD-10-CM

## 2011-03-04 LAB — BASIC METABOLIC PANEL
CO2: 29 mEq/L (ref 19–32)
Chloride: 95 mEq/L — ABNORMAL LOW (ref 96–112)
Creatinine, Ser: 0.58 mg/dL (ref 0.50–1.10)
Potassium: 3.9 mEq/L (ref 3.5–5.1)
Sodium: 132 mEq/L — ABNORMAL LOW (ref 135–145)

## 2011-03-04 LAB — GLUCOSE, CAPILLARY
Glucose-Capillary: 146 mg/dL — ABNORMAL HIGH (ref 70–99)
Glucose-Capillary: 150 mg/dL — ABNORMAL HIGH (ref 70–99)
Glucose-Capillary: 153 mg/dL — ABNORMAL HIGH (ref 70–99)

## 2011-03-04 LAB — MAGNESIUM: Magnesium: 2 mg/dL (ref 1.5–2.5)

## 2011-03-04 LAB — PHOSPHORUS: Phosphorus: 4 mg/dL (ref 2.3–4.6)

## 2011-03-04 MED ORDER — IOHEXOL 300 MG/ML  SOLN
600.0000 mL | Freq: Once | INTRAMUSCULAR | Status: AC | PRN
  Administered 2011-03-04: 600 mL

## 2011-03-06 LAB — CBC
MCHC: 35 g/dL (ref 30.0–36.0)
Platelets: 207 10*3/uL (ref 150–400)
RDW: 12.5 % (ref 11.5–15.5)
WBC: 6 10*3/uL (ref 4.0–10.5)

## 2011-03-06 LAB — COMPREHENSIVE METABOLIC PANEL
ALT: 12 U/L (ref 0–35)
Alkaline Phosphatase: 49 U/L (ref 39–117)
CO2: 28 mEq/L (ref 19–32)
Chloride: 99 mEq/L (ref 96–112)
GFR calc Af Amer: >90 mL/min (ref >90)
GFR calc non Af Amer: 83 mL/min — ABNORMAL LOW (ref >90)
Glucose, Bld: 92 mg/dL (ref 70–99)
Potassium: 3.8 mEq/L (ref 3.5–5.1)
Sodium: 134 mEq/L — ABNORMAL LOW (ref 135–145)
Total Bilirubin: 0.6 mg/dL (ref 0.3–1.2)

## 2011-03-07 LAB — URINALYSIS, ROUTINE W REFLEX MICROSCOPIC
Glucose, UA: NEGATIVE mg/dL
Hgb urine dipstick: NEGATIVE
Ketones, ur: NEGATIVE mg/dL
Protein, ur: NEGATIVE mg/dL

## 2011-03-07 LAB — URINE MICROSCOPIC-ADD ON

## 2011-03-07 NOTE — Discharge Summary (Signed)
NAMEDALANA, PFAHLER NO.:  [id number]  MEDICAL RECORD NO.:  [id number]  LOCATION:  5012                         FACILITY:  [facility]  PHYSICIAN:  Isidor Holts, M.D.  DATE OF BIRTH:  04-02-1924  DATE OF ADMISSION:  02/23/2011 DATE OF DISCHARGE:  03/07/2011                        DISCHARGE SUMMARY - REFERRING   PRIMARY MD:  Valetta Mole. Swords, MD  PRIMARY CARDIOLOGIST:  Jesse Sans. Daleen Squibb, MD, [facility]  PRIMARY PULMONOLOGIST:  Charlcie Cradle. Delford Field, MD, [facility].  PRIMARY GASTROENTEROLOGIST:  Hedwig Morton. Juanda Chance, MD  DISCHARGE DIAGNOSES: 1. Distal sigmoid colitis/proctitis. 2. Diverticulosis/constipation. 3. Candidal esophagitis, confirmed on EGD, March 02, 2011. 4. Chronic obstructive pulmonary disease. 5. Chronic respiratory failure, on home O2. 6. Hypertension. 7. Obstructive sleep apnea syndrome. 8. History of hypopharyngeal stenosis. 9. Previous history of breast cancer. 10.Moderate protein-calorie malnutrition. 11.Deconditioning.  DISCHARGE MEDICATIONS: 1. Albuterol nebulizer solution (2.5 mg/3 mL) one treatment p.r.n. q.4     hours for shortness of breath. 2. Bisacodyl 5 mg p.o. daily. 3. Bentyl 10 mg p.o. q.i.d. 4. Ensure Clinical Strength creamy milk chocolate, nutritional     supplement 237 mL p.o. t.i.d. 5. Hydrocortisone 2.5% cream (Anusol-HC) one application daily. 6. MiraLax 17 g p.o. daily. 7. Hydrocil Instant packet, one packet p.o. daily. 8. Diazepam 5 mg p.o. p.r.n. at bedtime for insomnia. 9. Losartan 100 mg p.o. daily. 10.Nasonex nasal spray 1-2 sprays each nostril daily. 11.Omeprazole 20 mg p.o. daily. 12.QVAR (beclomethasone inhalation 18 mcg) one puff b.i.d. 13.Spiriva hand inhaler (18 mcg) one capsule daily.  Note: Aleve has been discontinued.  PROCEDURES: 1. Abdominal/chest x-ray February 23, 2011.  This showed no obstruction     or free air.  There were low lung volumes, chronic changes/scarring     in the left lung. 2. Abdominal/pelvic  CT scan February 23, 2011.  This showed large     amount of stool in the colon associated with perirectal edema     suspicious for proctitis, no evidence of perforation or intra-     abdominal abscess, indeterminate low-density lesion involving the     upper pole of the right kidney, appears new from prior CT but was     seen on ultrasound December 03, 2009.  This is consistent with small     mildly complex cyst. 3. Abdominal x-ray February 25, 2011.  This showed no evidence of bowel     obstruction. 4. Abdominal x-ray February 26, 2011.  This showed residual contrast     throughout the nondistended colon with diverticula noted.  There     were gallstones. 5. CT angiogram of the abdomen/pelvis March 01, 2011.  They showed     prominent wall thickening in the distal sigmoid colon and rectum     without pneumatosis and without significant mesenteric vascular     stenosis, although vasospasm can involve small vessels and cause     ischemia and favor the prominent wall thickening in the sigmoid     colon and rectum to be inflammatory/infectious given the rectal     involvement would be unusual for ischemia.  Presacral edema is     present.  There was lumbar facet arthropathy,  scattered atelectasis     or scarring at the lung bases.  Small right renal cyst with     indeterminate small right kidney upper pole lesion as noted on     prior exam.  Cardiomegaly, atherosclerosis, possible     atherosclerotic narrowing of the left main coronary artery.     However, this could be spurious and today's examination was not     cardiac gated. 6. Water-soluble contrast enema March 04, 2011.  This was negative     for colonic obstruction or mass.  There was moderate-to-severe     diverticulosis, most severe in the sigmoid colon. 7. Flexible sigmoidoscopy done February 26, 2011 by Dr. [name],     the patient's gastroenterologist.  This showed proctitis, severe     diverticulosis in the sigmoid colon,  otherwise normal examination.     Biopsies done. 8. Upper GI endoscopy March 05, 2011 by Dr. [name].  This     showed candida esophagitis, confirmed on pathologic examination and     was otherwise normal examination.  A 3-day course of Diflucan was     recommended.  CONSULTATIONS:  Dr. [name], gastroenterologist.  ADMISSION HISTORY:  As in H and P notes of February 23, 2011 dictated by Dr. Talmage Nap. However, in brief, this is an 77 year old female, with known history of COPD, chronic respiratory failure on home oxygen supplementation, hypertension, obstructive sleep apnea syndrome, previous history of breast cancer, history of hypopharyngeal stenosis, status post hysterectomy, status post bladder prolapse status post bladder tack, status post left lobectomy secondary to lung mass with benign findings on biopsy per patient, presenting with a 10-day history of worsening abdominal pain and difficulty in defecation.  She has also had about 10 pounds weight loss over the preceding 3 months, secondary to poor appetite. Because of escalating symptoms and perirectal pain, the patient presented to the emergency department and was admitted for further evaluation, investigation, and management.   CLINICAL COURSE: 1. Distal sigmoid colitis/proctitis.  The patient presented as     described above.  Imaging studies confirmed findings consistent     with a distal sigmoid colitis/acute proctitis.  GI consultation was     kindly provided by Dr. [name], who performed a flexible     sigmoidoscopy, which confirmed the above findings.  The patient was     managed with empiric ciprofloxacin and Flagyl, as well as laxatives     and Anusol-HC suppository, with satisfactory clinical response.     Over the course of her hospitalization, GI symptoms improved.  She     was managed of course, with bowel rest, as well as parenteral     hyperalimentation. As of March 04, 2011, we were  able to wean her off     total nutritional admixture.  She was commenced on oral intake     which she tolerated and as of March 06, 2011, oral intake has     significantly improved, and clinical symptoms had considerably     ameliorated.  2. Diverticulosis.  The patient has been troubled by constipation in     the past, although this had become worse over the 10 days prior to     this hospitalization.  Imaging studies confirmed diverticulosis,     which is felt to be contributory to this.  Bentyl has been added to     her treatment regimen as well as a bowel regimen.  As of the date of  this dictation on March 06, 2011, constipation had resolved.  3. COPD.  The patient does have a known history of COPD/chronic     respiratory failure, on home oxygen.  She remained clinically     stable from this viewpoint, during the course of her hospitalization     and had no exacerbation.  4. Esophageal candidiasis.  The patient underwent upper GI endoscopy     on March 02, 2011, in an  uncomplicated procedure performed by Dr.     [name], gastroenterologist.  This demonstrated findings     consistent with candida esophagitis and was subsequently confirmed     by pathologic examination of brushings.  She was managed with a     course of Diflucan, with resolution of symptoms.  5. Hypertension.  This was managed with preadmission antihypertensive     medications.  6. History of obstructive sleep apnea syndrome.  This did not prove     problematic.  7. Moderate protein-calorie malnutrition.  The patient is     malnourished.  She had lost about 10 pounds in weight prior to     presentation and this was deemed secondary to poor oral intake.     She was evaluated by the nutritionist during this hospitalization     and appropriate recommendations made.  She has been commenced on     nutritional supplements.  8. Deconditioning.  The patient is of course, deconditioned secondary     to  her recent acute medical problems.  She has been evaluated by     PT/OT, and rehabilitation in skilled nursing facility     is recommended.   DISPOSITION:     The patient as of March 06, 2011 had clearly attained clinical stability.       There were no new issues.     Provided no acute problems arise in the interim, she will be     discharged to skilled nursing facility on March 07, 2011.  ACTIVITY:  As tolerated, otherwise per PT/OT.  DIET:  Heart healthy.  FOLLOW-UP INSTRUCTIONS:  The patient will follow up routinely with her primary MD, Dr. Birdie Sons, per prior scheduled appointment.  She will also follow up with Dr. Valera Castle, her primary cardiologist per prior scheduled appointment and with Dr. Shan Levans, primary pulmonologist per prior scheduled appointment.  She will follow up with Dr. [name], her primary gastroenterologist within 1-2 weeks of discharge. She has been instructed to call to make an appointment and has verbalized understanding.     Isidor Holts, M.D.     CO/MEDQ  D:  03/06/2011  T:  03/06/2011  Job:  [id number]  cc:   Valetta Mole. Swords, MD Jesse Sans. Daleen Squibb, MD, [facility] Charlcie Cradle. Delford Field, MD, [facility] Hedwig Morton. Juanda Chance, MD  Electronically Signed by Isidor Holts M.D. on 03/07/2011 09:11:53 AM

## 2011-03-10 NOTE — Progress Notes (Signed)
NAMEGWENDLOYN, [name] [name] NO.:  [id number]  MEDICAL RECORD NO.:  [id number]  LOCATION:  5012                         FACILITY:  [facility]  PHYSICIAN:  Altha Harm, MDDATE OF BIRTH:  1923-11-24                                PROGRESS NOTE   DATE OF DISCHARGE: Anticipated in 48 hours, however, not yet determined.  DIAGNOSES: At the time of this dictation, includes the following 1. Distal sigmoid colitis. 2. Diverticulosis. 3. Acute proctitis, improved. 4. Candidal esophagitis, currently being treated. 5. History of chronic obstructive pulmonary disease. 6. History of breast cancer. 7. Chronic respiratory failure on O2 at home. 8. Hypertension. 9. Sleep apnea. 10.Hypopharyngeal stenosis.  MEDICATIONS: To be dictated at the time of discharge.  CONSULTANTS: [facility].  PROCEDURES DONE: 1. Flexible sigmoidoscopy done by Dr. [name] which revealed proctitis with severe erythema and exudate. Contact bleeding consistent with nonspecific proctitis. 1. Gastrografin enema done on today which is negative for colonic     obstruction or mass.  There is moderate to severe diverticulosis     most severe in the sigmoid colon.  DIAGNOSTIC STUDIES: 1. Acute abdominal x-ray which shows no obstruction or free air. 2. Low lung volumes with chronic changes and scarring in the left     lung. 3. CT of the abdomen and pelvis done on the 14th which shows a large     amount of stool in the colon associated with perirectal edema     suspicious for proctitis. Impression A.  No evidence of perforation or intra-abdominal abscess. B.  Indeterminate low-density lesion involving the upper pole of the right kidney appears new from prior CTs that was seen on ultrasound on June 22, 2009, and is consistent with a small mildly complex cyst. 1. Abdomen one-view x-ray which showed no evidence of bowel     obstruction. 2. Repeat abdominal one-view x-ray which  showed residual contrast     throughout the nondistended colon with diverticulum noted. Impression A.  Gallstones. 1. CT angiogram of the abdomen which shows prominent wall thickening     of the distal sigmoid colon, rectum without pneumatosis and without     significant mesenteric vascular stenosis.  Also, vasospasm can     involve small vessels and cause ischemia, and favor the problem of     wall thickening in the sigmoid colon.  The rectum could be     inflammatory or infectious given that rectal involvement would be     unusual for ischemia.  Presacral edema is present.  It may be     prudent to test for C. diff toxin if not already performed. Impression A.  Lumbar facet arthropathy. B.  Scattered atelectasis or scarring of the lung base. C.  Small right renal cyst with indeterminate small right kidney upper pole lesion is noted on prior exam. D.  Cardiomegaly. E.  Atherosclerosis. F.  Possible atherosclerotic narrowing of the left main coronary artery. However, this could be spurious and today's examination was not cardiac gated. 1. Gastrografin enema which is negative for colonic obstruction or     mass.  There is moderate to severe diverticulosis more severe in  the sigmoid colon. 2. EGD by Dr. [name] which showed candidal esophagitis.  PRIMARY CARE PHYSICIAN: Valetta Mole. Swords, MD.  PRIMARY PULMONOLOGIST: Charlcie Cradle. Delford Field, MD, FCCP  PRIMARY CARDIOLOGIST: Jesse Sans. Wall, MD, [facility].  PRIMARY GASTROENTEROLOGIST: Hedwig Morton. Juanda Chance, MD  ALLERGIES: 1. PENICILLIN. 2. SULFA.  CODE STATUS: Full code.  CHIEF COMPLAINT: Abdominal pain.  The pain is about 10 days' duration.  HISTORY OF PRESENT ILLNESS: Please refer to the H and P by Dr. Beverly Gust for details of the HPI. However, in short, this is an 77 year old lady with a history of COPD who presented to the emergency room with abdominal pain and difficulty with defecation.  She was referred to [facility]  for further evaluation and management.  HOSPITAL COURSE: 1. Acute proctitis.  The patient is having abdominal pain, and she     related rectal pain is being the most prominent symptom that she     was having.  A CT of the abdomen and pelvis was done which showed     findings consistent with acute proctitis.  Gastroenterology was     consulted and took the patient for a flex sig where she was found     to have proctitis.  The patient has been treated with empiric     antibiotic Cipro and Flagyl.  She has also been treated with Anusol     suppositories and laxatives.  The patient has had improvement in     her rectal pain. 2. Abdominal pain.  The patient had a pattern of abdominal pain which     the patient had significant postprandial pain.  Owing to the fact     that the first CT did not show any evidence of colitis and the     patient had no symptoms of an infection, it was unclear as to the     cause of this pain.  There was some postulation that this may have     been referred pain from the upper GI system.  However, there was     also some concern that this could have been mesenteric ischemia     given the fact that the symptoms occur postprandially.  To that     end, the patient received an EGD which showed Candida esophagitis     but no evidence of ulcerations.  The patient also had a CT angiogram of the mesenteric arteries done which showed no evidence of mesenteric atherosclerosis.  The patient can be continued to have significant pain and the decision was made for bowel rest and TNA.  After the patient's bowels have chance to cool down, she was given a Gastrografin enema on today which appeared to have been diagnostic as well as therapeutic.  The patient was actually looks much better today than she has in several days.  She was up sitting up and states that she has had several watery stools which is likely partly the Gastrografin that she has developing.  Nevertheless she  feels better.  The plan at this point, after discussion with Dr. Christella Hartigan is to discontinue the TNA to advance the patient's diet, and to observe her progress.  He will treat her with fiber and laxatives.  Candidal esophagitis.  The patient is noted to have Candida on her EGD and was treated with fluconazole.  Her last dose was today.  Otherwise, the patient has remained stable during this hospitalization without any exacerbations of her chronic conditions.  Please note that the patient  had been started on Bentyl for spasms, however, as her symptoms resolved there may be some consideration of discontinuing the Bentyl.  LABORATORY STUDIES: On today shows a sodium of 132 potassium 3.9, chloride 95 bicarb 29, BUN 14, creatinine 0.58.  PHYSICAL EXAMINATION: HEENT:  She is normocephalic, atraumatic.  Pupils equally round, reactive to light and accommodation.  Extraocular movements are intact. Oropharynx is moist.  No exudate, erythema, or lesions are noted. NECK:  Trachea is midline.  No masses.  No thyromegaly.  No JVD.  No carotid bruit. RESPIRATORY:  She has a normal respiratory effort.  Equal excursion bilaterally.  No wheezing or rhonchi noted. CARDIOVASCULAR:  She has got a normal S1 and S2.  No murmurs, rubs, or gallops are noted.  PMI is nondisplaced.  No heaves or thrills on palpation. ABDOMEN:  Soft, nontender, nondistended.  No masses.  No hepatosplenomegaly is noted. EXTREMITIES:  Show no clubbing, cyanosis, or edema.  PLAN: As noted above.  This brings [place] up-to-date on the patient's clinical condition.     Altha Harm, MD     MAM/MEDQ  D:  03/04/2011  T:  03/04/2011  Job:  [id number]  Electronically Signed by Marthann Schiller MD on 03/10/2011 01:26:45 PM

## 2011-03-20 ENCOUNTER — Encounter: Payer: Self-pay | Admitting: *Deleted

## 2011-03-28 ENCOUNTER — Ambulatory Visit (INDEPENDENT_AMBULATORY_CARE_PROVIDER_SITE_OTHER): Payer: Medicare Other | Admitting: Internal Medicine

## 2011-03-28 ENCOUNTER — Encounter: Payer: Self-pay | Admitting: Internal Medicine

## 2011-03-28 VITALS — BP 124/82 | HR 85 | Ht 63.0 in | Wt 127.0 lb

## 2011-03-28 DIAGNOSIS — K5289 Other specified noninfective gastroenteritis and colitis: Secondary | ICD-10-CM

## 2011-03-28 DIAGNOSIS — K5901 Slow transit constipation: Secondary | ICD-10-CM

## 2011-03-28 MED ORDER — DOCUSATE SODIUM 100 MG PO CAPS: 100.0000 mg | ORAL_CAPSULE | Freq: Every evening | ORAL | Status: DC | PRN

## 2011-03-28 NOTE — Progress Notes (Signed)
[name] [name] 1923-12-30 MRN [id number]    History of Present Illness:  This is a 77 year old white female who is post recent hospitalization for acute colitis; likely ischemic. She has chronic constipation and was treated for colonic inertia.She has a hx of severe diverticulosis of the left colon She has difficulty with evacuation of the stool. She was transferred from the hospital to a rehabilitation unit where she is currently residing. She still has some abdominal discomfort although she is much improved. She is complaining about the food quality at the nursing home. She is on a laxative regimen which includes stool softener, MiraLax and Senokot. She is having some ropey and watery stools. Her rectum has remained irritated.   Past Medical History  Diagnosis Date  . ANXIETY 08/16/2008  . CAD, UNSPECIFIED SITE 10/17/2008  . CHOLELITHIASIS 08/23/2008  . COPD 01/04/2010    FeV1 101%, DLCO64% 2008  . 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 DIVERTICULOSIS-COLON 04/07/2008  . GERD 04/19/2007  . HYPERSOMNIA, ASSOCIATED WITH SLEEP APNEA 02/01/2007  . HYPERTENSION 11/12/2006  . MITRAL VALVE PROLAPSE, HX OF 10/07/2008  . NEOPLASM, MALIGNANT, BREAST, RIGHT 02/15/2008  . OSTEOPOROSIS 04/04/2008  . Raynaud's syndrome 04/25/2009  . Atrial fibrillation   . Airway obstruction   . History of diverticulitis of colon   . Tortuous colon   . Anal stricture   . Chronic constipation   . Fatty liver   . Hiatal hernia   . Esophageal stricture   . IBS (irritable bowel syndrome)   . Pulmonary fibrosis   . Sleep apnea   . COPD (chronic obstructive pulmonary disease)   . Colitis     sigmoid  . Candida esophagitis   . Diverticulitis    Past Surgical History  Procedure Date  . Abdominal hysterectomy   . Bilateral salpingoophorectomy     s/p prolapsed bladder  . Bladder surgery     prolapsed  . Thoracotomy     granuloma, pulmonary-thoracotomy  . Breast surgery 07-21-07    mastectomy (right), all  margins neg and LNs neg   . Appendectomy   . Tonsillectomy   . Rotator cuff repair   . Mastectomy     right  . Breast cyst excision     left  . Mouth surgery     reports that she quit smoking about 27 years ago. Her smoking use included Cigarettes. She has a 60 pack-year smoking history. She has never used smokeless tobacco. She reports that she drinks alcohol. She reports that she does not use illicit drugs. family history includes Breast cancer in an unspecified family member; Colon cancer in an unspecified family member; Coronary artery disease in her father; Heart failure in her mother; Parkinsonism in her father; Stroke in her mother; and Uterine cancer in an unspecified family member. Allergies  Allergen Reactions  . Ciprofloxacin Swelling  . Penicillins     REACTION: hives  . Prednisone   . Shrimp Flavor     REACTION: burning, itching, throat swelling  . Sulfamethoxazole     REACTION: Nausea/vomiting        Review of Systems: Denies any dysphagia odynophagia or chest pain  The remainder of the 10 point ROS is negative except as outlined in H&P   Physical Exam: General appearance  Well developed, in no distress. She is in a wheelchair Eyes- non icteric. HEENT nontraumatic, normocephalic. Mouth no lesions, tongue papillated, no cheilosis. Neck supple without adenopathy, thyroid not enlarged, no carotid bruits, no JVD. Lungs Clear  to auscultation bilaterally. Cor normal S1, normal S2, regular rhythm, no murmur,  quiet precordium. Abdomen: Soft and relaxed. Not distended. Much improved from the last exam 3 weeks ago. She has normal active bowel sounds but diffuse tenderness throughout the abdomen mostly in the left lower quadrant. There is no palpable mass. Rectal: Very tender anal canal. Small amount of Hemoccult negative stool. Extremities no pedal edema. Skin no lesions. Neurological alert and oriented x 3. Psychological normal mood and affect.  Assessment and  Plan:  Problem #1 chronic constipation due to decreased colonic motility, severe diverticulosis with partial sigmoid colon obstruction  and due to rectocele. She is much improved since her hospitalization. We have discussed extensively her laxative regimen. We will reduce the MiraLax to only 4 ounces every day or every other day. We will also change the stool softener to an as needed basis but will continue her Senokot one a day. She will add hydrocortisone cream 1% to the rectum. She will be discharged from the facility soon and we will start her on a high fiber diet. She will also continue on Metamucil 1 teaspoon daily. I will see her in 8 weeks.   03/28/2011 [name] [name]

## 2011-03-28 NOTE — Patient Instructions (Addendum)
Metamucil 1 teaspoon daily Colace 100 mg 1 capsule by mouth every night as needed. Urinalysis and culture to be completed at [facility]. Follow up with Dr Juanda Chance in January 2013. CC: Dr Cato Mulligan

## 2011-04-08 ENCOUNTER — Telehealth: Payer: Self-pay | Admitting: Internal Medicine

## 2011-04-08 NOTE — Telephone Encounter (Signed)
Pt was in hosp for a few wks and then was sent to [facility]. Pt had a blocked colon. Pt was discharged and is needs hosp fup asap with pcp.

## 2011-04-09 NOTE — Telephone Encounter (Signed)
Pt scheduled for 04/16/11 at 11:00. Left a detailed message for pt with appt time. Requested pt call back if time did not work

## 2011-04-09 NOTE — Telephone Encounter (Signed)
We will try to work in next week

## 2011-04-11 ENCOUNTER — Telehealth: Payer: Self-pay | Admitting: *Deleted

## 2011-04-11 NOTE — Telephone Encounter (Signed)
Cam Hai, nurse from [facility], called to inform that per patient they will no longer send nurses to her home.  PT will continue.  FYI.

## 2011-04-16 ENCOUNTER — Ambulatory Visit (INDEPENDENT_AMBULATORY_CARE_PROVIDER_SITE_OTHER): Payer: Medicare Other | Admitting: Internal Medicine

## 2011-04-16 ENCOUNTER — Encounter: Payer: Self-pay | Admitting: Internal Medicine

## 2011-04-16 DIAGNOSIS — K219 Gastro-esophageal reflux disease without esophagitis: Secondary | ICD-10-CM

## 2011-04-16 DIAGNOSIS — I10 Essential (primary) hypertension: Secondary | ICD-10-CM

## 2011-04-16 DIAGNOSIS — C50919 Malignant neoplasm of unspecified site of unspecified female breast: Secondary | ICD-10-CM

## 2011-04-16 NOTE — Assessment & Plan Note (Signed)
BP Readings from Last 3 Encounters:  04/16/11 114/74  03/28/11 124/82  02/06/11 120/70   Well controlled, continue current meds

## 2011-04-16 NOTE — Assessment & Plan Note (Signed)
No known recurrence 

## 2011-04-16 NOTE — Assessment & Plan Note (Signed)
She has not had any sxs and states that she is not taking any meds

## 2011-04-16 NOTE — Progress Notes (Signed)
Patient ID: [name] [name], female   DOB: July 03, 1923, 77 y.o.   MRN: [id number] Hospitalized in 02/2011 with the following d/c diagnoses DISCHARGE DIAGNOSES:  1. Distal sigmoid colitis/proctitis. --reviewed imaging studies 2. Diverticulosis/constipation.  3. Candidal esophagitis, confirmed on EGD, March 02, 2011. ---sxs have resolved 4. Chronic obstructive pulmonary disease. ---chronic problem--uses home O2 5. Chronic respiratory failure, on home O2. --- no sxs 6. Hypertension. --tolerating meds 7. Obstructive sleep apnea syndrome. --uses Home O2, has regular f/u with pulmonary 8. History of hypopharyngeal stenosis.  9. Previous history of breast cancer.  10.Moderate protein-calorie malnutrition.  11.Deconditioning.  Past Medical History  Diagnosis Date  . ANXIETY 08/16/2008  . CAD, UNSPECIFIED SITE 10/17/2008  . CHOLELITHIASIS 08/23/2008  . COPD 01/04/2010    FeV1 101%, DLCO64% 2008  . 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 DIVERTICULOSIS-COLON 04/07/2008  . GERD 04/19/2007  . HYPERSOMNIA, ASSOCIATED WITH SLEEP APNEA 02/01/2007  . HYPERTENSION 11/12/2006  . MITRAL VALVE PROLAPSE, HX OF 10/07/2008  . NEOPLASM, MALIGNANT, BREAST, RIGHT 02/15/2008  . OSTEOPOROSIS 04/04/2008  . Raynaud's syndrome 04/25/2009  . Atrial fibrillation   . Airway obstruction   . History of diverticulitis of colon   . Tortuous colon   . Anal stricture   . Chronic constipation   . Fatty liver   . Hiatal hernia   . Esophageal stricture   . IBS (irritable bowel syndrome)   . Pulmonary fibrosis   . Sleep apnea   . COPD (chronic obstructive pulmonary disease)   . Colitis     sigmoid  . Candida esophagitis   . Diverticulitis     History   Social History  . Marital Status: Widowed    Spouse Name: N/A    Number of Children: 3  . Years of Education: N/A   Occupational History  . retireed    Social History Main Topics  . Smoking status: Former Smoker -- 2.0 packs/day for 30 years    Types:  Cigarettes    Quit date: 05/13/1983  . Smokeless tobacco: Never Used  . Alcohol Use: Yes     1-2 glaases wine per day  . Drug Use: No  . Sexually Active: Not on file   Other Topics Concern  . Not on file   Social History Narrative  . No narrative on file    Past Surgical History  Procedure Date  . Abdominal hysterectomy   . Bilateral salpingoophorectomy     s/p prolapsed bladder  . Bladder surgery     prolapsed  . Thoracotomy     granuloma, pulmonary-thoracotomy  . Breast surgery 07-21-07    mastectomy (right), all margins neg and LNs neg   . Appendectomy   . Tonsillectomy   . Rotator cuff repair   . Mastectomy     right  . Breast cyst excision     left  . Mouth surgery     Family History  Problem Relation Age of Onset  . Parkinsonism Father   . Breast cancer      aunt  . Colon cancer      aunt, age 75  . Uterine cancer      aunt  . Coronary artery disease Father   . Stroke Mother   . Heart failure Mother     Allergies  Allergen Reactions  . Ciprofloxacin Swelling  . Penicillins     REACTION: hives  . Prednisone   . Shrimp Flavor     REACTION: burning, itching, throat swelling  .  Sulfamethoxazole     REACTION: Nausea/vomiting    Current Outpatient Prescriptions on File Prior to Visit  Medication Sig Dispense Refill  . AMBULATORY NON FORMULARY MEDICATION Oxygen at bedtime       . aspirin 325 MG tablet Take 325 mg by mouth daily.        . beclomethasone (QVAR) 80 MCG/ACT inhaler Inhale 1 puff into the lungs 2 (two) times daily.  1 Inhaler  0  . diazepam (VALIUM) 5 MG tablet take 1 tablet by mouth at bedtime if needed for sleep  30 tablet  3  . fexofenadine (ALLEGRA) 180 MG tablet Take 1 tablet (180 mg total) by mouth daily.  30 tablet  11  . losartan (COZAAR) 100 MG tablet Take 1 tablet (100 mg total) by mouth daily.  90 tablet  3  . meloxicam (MOBIC) 7.5 MG tablet Take 7.5 mg by mouth daily as needed.       . mometasone (NASONEX) 50 MCG/ACT nasal  spray 2 sprays by Nasal route 2 (two) times daily.  17 g  11  . nitroGLYCERIN (NITROSTAT) 0.4 MG SL tablet Place 0.4 mg under the tongue every 5 (five) minutes as needed.        Marland Kitchen omeprazole (PRILOSEC) 20 MG capsule Take 1 capsule (20 mg total) by mouth daily.  14 capsule  0  . polyethylene glycol (MIRALAX / GLYCOLAX) packet Take 17 g by mouth daily. At bedtime        . Polyethylene Glycol 1000 POWD Take 1,000 mg by mouth. Takes as needed      . sennosides-docusate sodium (SENOKOT-S) 8.6-50 MG tablet Take 1 tablet by mouth daily.        Marland Kitchen tiotropium (SPIRIVA) 18 MCG inhalation capsule Place 1 capsule (18 mcg total) into inhaler and inhale daily.  90 capsule  3     patient denies chest pain, shortness of breath, orthopnea. Denies lower extremity edema, abdominal pain, change in appetite, change in bowel movements. Patient denies rashes, musculoskeletal complaints. No other specific complaints in a complete review of systems.   BP 114/74  Pulse 72  Temp(Src) 98.5 F (36.9 C) (Oral)  Wt 123 lb (55.792 kg)  Well-developed well-nourished female in no acute distress. HEENT exam atraumatic, normocephalic, extraocular muscles are intact. Neck is supple. No jugular venous distention no thyromegaly. Chest clear to auscultation without increased work of breathing. Cardiac exam S1 and S2 are regular. Abdominal exam active bowel sounds, soft, nontender. Extremities no edema.

## 2011-04-21 ENCOUNTER — Encounter (INDEPENDENT_AMBULATORY_CARE_PROVIDER_SITE_OTHER): Payer: Self-pay | Admitting: Surgery

## 2011-04-21 ENCOUNTER — Ambulatory Visit (INDEPENDENT_AMBULATORY_CARE_PROVIDER_SITE_OTHER): Payer: Medicare Other | Admitting: Surgery

## 2011-04-21 VITALS — BP 120/84 | HR 76 | Temp 98.3°F | Resp 18 | Ht 62.5 in | Wt 123.6 lb

## 2011-04-21 DIAGNOSIS — Z853 Personal history of malignant neoplasm of breast: Secondary | ICD-10-CM | POA: Insufficient documentation

## 2011-04-21 NOTE — Patient Instructions (Signed)
1.  See me back in 6 weeks if right breast is still bothering you.  Nodules or pain.

## 2011-04-21 NOTE — Progress Notes (Signed)
[facility]  Ovidio Kin, MD,  FACS [address]  [address] [place], [place]    [address] Phone:  [phone] FAX:  [phone]   Re:   [name] [name] DOB:   04/21/1924 MRN:   [id number]  ASSESSMENT AND PLAN: 1.  . Right breast cancer. T1b, N0.   Right lumpectomy and Right SLNBx - 07/21/2007.  Disease free and doing well over 3 years from her surgery.   Pain and nodules in right breast.  There is nothing I can find that is of concern, but I will see her back in 6 weeks.  If she is still having pain or there are nodules, willl move her annual mammogram up.  If she is doing better, she can call and cancel, and we'll arrange when to see her.  2. History of gall stones. Continue to observe.  3. On home O2 at night.  4. COPD.   Sees Dr. Jonni Sanger. 5. History of left thoracotomy where she still has pain issues.  HISTORY OF PRESENT ILLNESS: Chief Complaint  Patient presents with  . Other    est pt new prob- eval swelling in lt axillary. hx of breast ca on same side    [name] [name] is a 77 y.o. (DOB: 1924/04/10)  white female who is a patient of Judie Petit, MD, MD and comes to me today for follow up breast cancer.  She had a right breast carcinoma which was 6 mm invasive ductal carcinoma. This was ER and PR receptor negative, Ki67 was 14%, and HER-2/neu negative. Her surgery was in July 21, 2007 and was a right breast lumpectomy and right axillary sentinel lymph node biopsy.  She was doing well, but felt some nodules caudad to her scar on the right and has some tenderness where these are.  She has noticed these for about 10 days.  There is no incident which started the pain.  She has had no nipple discharge.  Her daughter is with her.  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   PHYSICAL EXAM: BP 120/84  Pulse 76  Temp(Src) 98.3 F (36.8 C) (Temporal)  Resp 18  Ht 5'  2.5" (1.588 m)  Wt 123 lb 9.6 oz (56.065 kg)  BMI 22.25 kg/m2  HEENT:  Pupils equal.  Dentition good.  No injury. NECK:  Supple.  No thyroid mass. LYMPH NODES:  No cervical, supraclavicular, or axillary adenopathy. BREASTS -  RIGHT:  Scar at 12 o'clock.  Tender beneath scar and she can feel some nodules.  I may feel two areas of scar caudad to the incision.   LEFT:  No palpable mass or nodule.  No nipple discharge. UPPER EXTREMITIES:  No evidence of lymphedema.  DATA REVIEWED: [place] in office - I see only bland breast tissue.  No nodule of concern.  Ovidio Kin, MD, FACS Office:  [phone]

## 2011-06-06 ENCOUNTER — Encounter (INDEPENDENT_AMBULATORY_CARE_PROVIDER_SITE_OTHER): Payer: Medicare Other | Admitting: Surgery

## 2011-06-10 ENCOUNTER — Ambulatory Visit (INDEPENDENT_AMBULATORY_CARE_PROVIDER_SITE_OTHER): Payer: Medicare Other | Admitting: Critical Care Medicine

## 2011-06-10 ENCOUNTER — Encounter: Payer: Self-pay | Admitting: Critical Care Medicine

## 2011-06-10 DIAGNOSIS — J449 Chronic obstructive pulmonary disease, unspecified: Secondary | ICD-10-CM

## 2011-06-10 DIAGNOSIS — J302 Other seasonal allergic rhinitis: Secondary | ICD-10-CM

## 2011-06-10 DIAGNOSIS — J309 Allergic rhinitis, unspecified: Secondary | ICD-10-CM | POA: Diagnosis not present

## 2011-06-10 DIAGNOSIS — J40 Bronchitis, not specified as acute or chronic: Secondary | ICD-10-CM | POA: Diagnosis not present

## 2011-06-10 MED ORDER — MOMETASONE FUROATE 50 MCG/ACT NA SUSP: 2.0000 | Freq: Two times a day (BID) | NASAL | Status: DC

## 2011-06-10 MED ORDER — TIOTROPIUM BROMIDE MONOHYDRATE 18 MCG IN CAPS: 18.0000 ug | ORAL_CAPSULE | Freq: Every day | RESPIRATORY_TRACT | Status: DC

## 2011-06-10 NOTE — Assessment & Plan Note (Signed)
Moderate to severe chronic obstructive lung disease oxygen dependent at night stable at this time Plan Maintain inhaled medications as prescribed Maintain nasal steroid

## 2011-06-10 NOTE — Progress Notes (Signed)
Subjective:    Patient ID: [name] [name], female    DOB: 1923/06/01, 78 y.o.   MRN: [id number]  HPI  his is an 78 y.o.   patient with a history of COPD and mycobacterium avium intracellulare with chronic granulomatous lung disease. The pt also has hx of upper airway obstruction with difficult intubation with prior operative procedures. Also hx Breast CA s/p resection and XRT. Hx Gallstone disease with conservative Rx due to difficult intubation status.    06/10/2011 Did go to [facility].  Was in hospital this fall for bowel obstruction. Went to [facility] for rehab.  Now; sl cough, chest does get tight and gets chest pain at night.  No wheeze is noted. Notes dyspnea at times.  Has a dog, took over daughters dog.     Review of Systems  Constitutional:   No  weight loss, night sweats,  Fevers, chills, fatigue, or  lassitude.  HEENT:   No headaches,  Difficulty swallowing,  Tooth/dental problems, or  Sore throat,             + sneezing, itching,  nasal congestion, post nasal drip,   CV:  No chest pain,  Orthopnea, PND, swelling in lower extremities, anasarca, dizziness, palpitations, syncope.   GI  No heartburn, indigestion, abdominal pain, nausea, vomiting, diarrhea, change in bowel habits, loss of appetite, bloody stools.   Resp:  ,  No coughing up of blood.     No wheezing.  No chest wall deformity  Skin: no rash or lesions.  GU: no dysuria, change in color of urine, no urgency or frequency.  No flank pain, no hematuria   MS:  No joint pain or swelling.  No decreased range of motion.  No back pain.  Psych:  No change in mood or affect. No depression or anxiety.  No memory loss.         Objective:   Physical Exam BP 122/78  Pulse 70  Temp(Src) 97.8 F (36.6 C) (Oral)  Ht 5\' 3"  (1.6 m)  Wt 125 lb 3.2 oz (56.79 kg)  BMI 22.18 kg/m2  SpO2 96%  GEN: A/Ox3; pleasant , NAD, elderly  HEENT:  /AT,  EACs-clear, TMs-wnl, NOSE-clear, THROAT-clear, no lesions, no  postnasal drip or exudate noted.   NECK:  Supple w/ fair ROM; no JVD; normal carotid impulses w/o bruits; no thyromegaly or nodules palpated; no lymphadenopathy.  RESP  Coarse BS w/ no wheezingno accessory muscle use, no dullness to percussion  CARD:  RRR, no m/r/g  , no peripheral edema, pulses intact, no cyanosis or clubbing.  GI:   Soft & nt; nml bowel sounds; no organomegaly or masses detected.  Musco: Warm bil, no deformities or joint swelling noted.   Neuro: alert, no focal deficits noted.    Skin: Warm, no lesions or rashes         Assessment & Plan:   COPD Moderate to severe chronic obstructive lung disease oxygen dependent at night stable at this time Plan Maintain inhaled medications as prescribed Maintain nasal steroid     Updated Medication List Outpatient Encounter Prescriptions as of 06/10/2011  Medication Sig Dispense Refill  . AMBULATORY NON FORMULARY MEDICATION Oxygen at bedtime       . aspirin 325 MG tablet Take 325 mg by mouth daily.        . diazepam (VALIUM) 5 MG tablet take 1 tablet by mouth at bedtime if needed for sleep  30 tablet  3  . losartan (  COZAAR) 100 MG tablet Take 1 tablet (100 mg total) by mouth daily.  90 tablet  3  . mometasone (NASONEX) 50 MCG/ACT nasal spray Place 2 sprays into the nose 2 (two) times daily.  41 g  4  . omeprazole (PRILOSEC) 20 MG capsule Take 1 capsule by mouth Daily.      . polyethylene glycol (MIRALAX / GLYCOLAX) packet Take 17 g by mouth daily. At bedtime        . tiotropium (SPIRIVA) 18 MCG inhalation capsule Place 1 capsule (18 mcg total) into inhaler and inhale daily.  90 capsule  3  . DISCONTD: mometasone (NASONEX) 50 MCG/ACT nasal spray 2 sprays by Nasal route 2 (two) times daily.  17 g  11  . DISCONTD: tiotropium (SPIRIVA) 18 MCG inhalation capsule Place 1 capsule (18 mcg total) into inhaler and inhale daily.  90 capsule  3  . DISCONTD: beclomethasone (QVAR) 80 MCG/ACT inhaler Inhale 1 puff into the lungs 2  (two) times daily.  1 Inhaler  0

## 2011-06-10 NOTE — Patient Instructions (Signed)
No change in medications. Return in         4 months 

## 2011-06-11 ENCOUNTER — Other Ambulatory Visit: Payer: Self-pay | Admitting: *Deleted

## 2011-06-11 DIAGNOSIS — J302 Other seasonal allergic rhinitis: Secondary | ICD-10-CM

## 2011-06-11 DIAGNOSIS — J40 Bronchitis, not specified as acute or chronic: Secondary | ICD-10-CM

## 2011-06-11 NOTE — Telephone Encounter (Signed)
Pt returned triage's call & asked to be reached after 4:30 p.m. Today. [name] [name]

## 2011-06-11 NOTE — Telephone Encounter (Signed)
lmomtcb to inform pt - then will send rxs to [facility] for Spiriva and Nasonex.

## 2011-06-12 MED ORDER — TIOTROPIUM BROMIDE MONOHYDRATE 18 MCG IN CAPS: 18.0000 ug | ORAL_CAPSULE | Freq: Every day | RESPIRATORY_TRACT | Status: DC

## 2011-06-12 MED ORDER — MOMETASONE FUROATE 50 MCG/ACT NA SUSP: 2.0000 | Freq: Two times a day (BID) | NASAL | Status: DC

## 2011-06-12 NOTE — Telephone Encounter (Signed)
Called, spoke with pt.  I advised we received fax from [facility] stating her rxs will need to be sent to [facility].  She is aware rxs for nasonex and spiriva sent to [facility].  She verbalized understanding of this and voiced no further questions at this time.

## 2011-06-27 ENCOUNTER — Other Ambulatory Visit (INDEPENDENT_AMBULATORY_CARE_PROVIDER_SITE_OTHER): Payer: Self-pay | Admitting: Surgery

## 2011-06-27 DIAGNOSIS — Z853 Personal history of malignant neoplasm of breast: Secondary | ICD-10-CM

## 2011-07-11 ENCOUNTER — Encounter: Payer: Self-pay | Admitting: Internal Medicine

## 2011-07-11 ENCOUNTER — Ambulatory Visit (INDEPENDENT_AMBULATORY_CARE_PROVIDER_SITE_OTHER): Payer: Medicare Other | Admitting: Internal Medicine

## 2011-07-11 DIAGNOSIS — G4710 Hypersomnia, unspecified: Secondary | ICD-10-CM | POA: Diagnosis not present

## 2011-07-11 DIAGNOSIS — K219 Gastro-esophageal reflux disease without esophagitis: Secondary | ICD-10-CM | POA: Diagnosis not present

## 2011-07-11 DIAGNOSIS — G4730 Sleep apnea, unspecified: Secondary | ICD-10-CM

## 2011-07-11 DIAGNOSIS — I2510 Atherosclerotic heart disease of native coronary artery without angina pectoris: Secondary | ICD-10-CM | POA: Diagnosis not present

## 2011-07-11 DIAGNOSIS — I10 Essential (primary) hypertension: Secondary | ICD-10-CM | POA: Diagnosis not present

## 2011-07-11 NOTE — Assessment & Plan Note (Signed)
She states that her sleep is "cured".  She seems to think that sleep is better because she has a dog that requires a lot of exercise

## 2011-07-11 NOTE — Assessment & Plan Note (Signed)
No sxs Continue PPI

## 2011-07-11 NOTE — Assessment & Plan Note (Signed)
No sxs She has had thorough evaluation previously

## 2011-07-11 NOTE — Progress Notes (Signed)
  Subjective:    Patient ID: [name] [name], female    DOB: Mar 11, 1924, 78 y.o.   MRN: [id number]  HPI  CAD--no sxs of CP, SOB, PND Htn_tolerating meds without difficulty COPD---has regular f/u with Dr. Delford Field Breast CA--followed by Dr. Ezzard Standing  Past Medical History  Diagnosis Date  . ANXIETY 08/16/2008  . CAD, UNSPECIFIED SITE 10/17/2008  . CHOLELITHIASIS 08/23/2008  . COPD 01/04/2010    FeV1 101%, DLCO64% 2008  . 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 DIVERTICULOSIS-COLON 04/07/2008  . GERD 04/19/2007  . HYPERSOMNIA, ASSOCIATED WITH SLEEP APNEA 02/01/2007  . HYPERTENSION 11/12/2006  . MITRAL VALVE PROLAPSE, HX OF 10/07/2008  . NEOPLASM, MALIGNANT, BREAST, RIGHT 02/15/2008  . OSTEOPOROSIS 04/04/2008  . Raynaud's syndrome 04/25/2009  . Atrial fibrillation   . Airway obstruction   . History of diverticulitis of colon   . Tortuous colon   . Anal stricture   . Chronic constipation   . Fatty liver   . Hiatal hernia   . Esophageal stricture   . IBS (irritable bowel syndrome)   . Pulmonary fibrosis   . Sleep apnea   . COPD (chronic obstructive pulmonary disease)   . Colitis     sigmoid  . Candida esophagitis   . Diverticulitis    Past Surgical History  Procedure Date  . Abdominal hysterectomy   . Bilateral salpingoophorectomy     s/p prolapsed bladder  . Bladder surgery     prolapsed  . Thoracotomy     granuloma, pulmonary-thoracotomy  . Breast surgery 07-21-07    mastectomy (right), all margins neg and LNs neg   . Appendectomy   . Tonsillectomy   . Rotator cuff repair   . Mastectomy     right  . Breast cyst excision     left  . Mouth surgery     reports that she quit smoking about 28 years ago. Her smoking use included Cigarettes. She has a 60 pack-year smoking history. She has never used smokeless tobacco. She reports that she drinks alcohol. She reports that she does not use illicit drugs. family history includes Breast cancer in an unspecified family member;  Cancer in her maternal aunt; Colon cancer in an unspecified family member; Coronary artery disease in her father; Heart failure in her mother; Parkinsonism in her father; Stroke in her mother; and Uterine cancer in an unspecified family member. Allergies  Allergen Reactions  . Ciprofloxacin Swelling  . Penicillins     REACTION: hives  . Prednisone   . Shrimp Flavor     REACTION: burning, itching, throat swelling  . Sulfamethoxazole     REACTION: Nausea/vomiting   Review of Systems  patient denies chest pain, shortness of breath, orthopnea. Denies lower extremity edema, abdominal pain, change in appetite, change in bowel movements. Patient denies rashes, musculoskeletal complaints. No other specific complaints in a complete review of systems.      Objective:   Physical Exam    Well-developed well-nourished female in no acute distress. HEENT exam atraumatic, normocephalic, extraocular muscles are intact. Neck is supple. No jugular venous distention no thyromegaly. Chest clear to auscultation without increased work of breathing. Cardiac exam S1 and S2 are regular. Abdominal exam active bowel sounds, soft, nontender. Extremities no edema.     Assessment & Plan:

## 2011-07-11 NOTE — Assessment & Plan Note (Signed)
Tolerating meds Repeat BP 135/70

## 2011-07-18 ENCOUNTER — Encounter (INDEPENDENT_AMBULATORY_CARE_PROVIDER_SITE_OTHER): Payer: Medicare Other | Admitting: Surgery

## 2011-07-21 ENCOUNTER — Telehealth: Payer: Self-pay | Admitting: Internal Medicine

## 2011-07-21 NOTE — Telephone Encounter (Signed)
Pt came by office to see if her note to get a hearing test done, was ready for pick up. Pt said that she needs this note asap, because has appt with and audiologist tomorrow a.m. Pt said that Dr Cato Mulligan did a letter about 1 year ago and pt said that this letter would be fine. Pt would like to wait until letter is ready for pick up or pick up it tomorrow a.m. Before ov.

## 2011-07-21 NOTE — Telephone Encounter (Signed)
Pt needs a note stating that she need to have a hearing test so that medicare will cover it. Pt stated Dr.Swords gave her a hand written note in the past but was about a year ago and it requesting a new note. Pt needs to pick it up today her appt is in the morning

## 2011-07-22 NOTE — Telephone Encounter (Signed)
Pt picked up referral order

## 2011-08-06 DIAGNOSIS — H905 Unspecified sensorineural hearing loss: Secondary | ICD-10-CM | POA: Diagnosis not present

## 2011-08-11 ENCOUNTER — Telehealth: Payer: Self-pay | Admitting: *Deleted

## 2011-08-11 MED ORDER — CEPHALEXIN 500 MG PO CAPS: 500.0000 mg | ORAL_CAPSULE | Freq: Four times a day (QID) | ORAL | Status: AC

## 2011-08-11 NOTE — Telephone Encounter (Signed)
Daughter called back stating Mom is running a fever of about 100 and keeping some liguids down, but still has diarrhea.  She is having dysuria, and chills with low back pain when urinating now, and thinks she has an UTI.  Should we send antibiotics or send her to the ER???

## 2011-08-11 NOTE — Telephone Encounter (Signed)
Daughter is calling re: mother having vomiting and diarrhea.  Will go to her home and check her temp and evaluate her.  She will call back.  Dr. Cato Mulligan says if she cannot keep any liquids down, she has to go to the hospital.  Otherwise, she has to go to the hospital for IV's and possible admission.

## 2011-08-11 NOTE — Telephone Encounter (Signed)
Pt is incontinent and cannot get a specimen today.  Her temp is running about 101, but she is getting down water.

## 2011-08-11 NOTE — Telephone Encounter (Signed)
As long as keeping down water, i'm ok calling in keflex 500 mg po qid for 7 days. This is ok despite pcn allergy. If possible I'd like to get urine sample to be sent for culture before she takes ABX

## 2011-08-11 NOTE — Telephone Encounter (Signed)
Pt aware.

## 2011-08-11 NOTE — Telephone Encounter (Signed)
No answer when attempt to call back.

## 2011-08-12 ENCOUNTER — Telehealth: Payer: Self-pay | Admitting: *Deleted

## 2011-08-12 ENCOUNTER — Telehealth: Payer: Self-pay | Admitting: Family Medicine

## 2011-08-12 NOTE — Telephone Encounter (Signed)
Ok to start emeric ABX

## 2011-08-12 NOTE — Telephone Encounter (Signed)
Debby, it looks like you sent a request for a 7-day antibiotic to a mail order pharmacy. Please call the daughter back and ask her where she wants the abx sent to - a mail order pharmacy is not appropriate in this situation. Please call ASAP, as she's been waiting for this for some time. Thanks!

## 2011-08-12 NOTE — Telephone Encounter (Signed)
[name] [name], this lady and I spoke about 4- 5 x yesterday, and there were questions re: whether she could take pills: due to a stricture in her esophagus ?  What she was allergic to, and if it was a true allergy or side effect.  The medication has ( I hope)  gotten to the correct pharmacy now.  This happens several times daily with our local and or mail pharmacies.  In a lot of cases we end up just calling it to multiple pharmacies to finally get it right.  Thanks for the help, and info.!

## 2011-08-12 NOTE — Telephone Encounter (Signed)
Pt's daughter calling. Pulled from triage vmail. Time of call was 9:09. States she spoke with Debby several times yesterday about mother and that an abx was to be called in to pharm. Nothing has been done. Please call her back.

## 2011-08-15 NOTE — Telephone Encounter (Signed)
Opened in error//slm °

## 2011-08-25 ENCOUNTER — Ambulatory Visit
Admission: RE | Admit: 2011-08-25 | Discharge: 2011-08-25 | Disposition: A | Discharge status: Home / Self Care | Payer: Medicare Other | Source: Ambulatory Visit | Attending: Surgery | Admitting: Surgery

## 2011-08-25 ENCOUNTER — Other Ambulatory Visit (INDEPENDENT_AMBULATORY_CARE_PROVIDER_SITE_OTHER): Payer: Self-pay | Admitting: Surgery

## 2011-08-25 ENCOUNTER — Other Ambulatory Visit: Payer: Self-pay | Admitting: Internal Medicine

## 2011-08-25 DIAGNOSIS — Z853 Personal history of malignant neoplasm of breast: Secondary | ICD-10-CM

## 2011-08-25 DIAGNOSIS — N644 Mastodynia: Secondary | ICD-10-CM | POA: Diagnosis not present

## 2011-08-27 ENCOUNTER — Ambulatory Visit (INDEPENDENT_AMBULATORY_CARE_PROVIDER_SITE_OTHER): Payer: Medicare Other | Admitting: Critical Care Medicine

## 2011-08-27 ENCOUNTER — Encounter: Payer: Self-pay | Admitting: Critical Care Medicine

## 2011-08-27 VITALS — BP 122/68 | HR 78 | Temp 98.4°F | Ht 62.0 in | Wt 124.4 lb

## 2011-08-27 DIAGNOSIS — A310 Pulmonary mycobacterial infection: Secondary | ICD-10-CM

## 2011-08-27 NOTE — Assessment & Plan Note (Signed)
Chronic obstructive lower airways disease on the basis of prior mycobacterial lung infection with MAC and mild bronchiectasis  Plan Maintain Spiriva No additional systemic antibiotics indicated

## 2011-08-27 NOTE — Patient Instructions (Signed)
No change in medications. Return in         4 months 

## 2011-08-27 NOTE — Progress Notes (Signed)
Subjective:    Patient ID: [name] [name], female    DOB: 1924/01/15, 78 y.o.   MRN: [id number]  HPI  This is a 78 y.o. 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 The pt also has hx of upper airway obstruction with difficult intubation with prior operative procedures. Also hx Breast CA s/p resection and XRT. Hx Gallstone disease with conservative Rx due to difficult intubation status.    1/29 Did go to [facility].  Was in hospital this fall for bowel obstruction. Went to [facility] for rehab.  Now; sl cough, chest does get tight and gets chest pain at night.  No wheeze is noted. Notes dyspnea at times.  Has a dog, took over daughters dog.  08/27/2011 Doing well, dyspnea is the same. Issues with fast walking.  If goes slow is ok.   Uses oxygen at night.     Past Medical History  Diagnosis Date  . ANXIETY 08/16/2008  . CAD, UNSPECIFIED SITE 10/17/2008  . CHOLELITHIASIS 08/23/2008  . COPD 01/04/2010    FeV1 101%, DLCO64% 2008  . 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 DIVERTICULOSIS-COLON 04/07/2008  . GERD 04/19/2007  . HYPERSOMNIA, ASSOCIATED WITH SLEEP APNEA 02/01/2007  . HYPERTENSION 11/12/2006  . MITRAL VALVE PROLAPSE, HX OF 10/07/2008  . NEOPLASM, MALIGNANT, BREAST, RIGHT 02/15/2008  . OSTEOPOROSIS 04/04/2008  . Raynaud's syndrome 04/25/2009  . Atrial fibrillation   . Airway obstruction   . History of diverticulitis of colon   . Tortuous colon   . Anal stricture   . Chronic constipation   . Fatty liver   . Hiatal hernia   . Esophageal stricture   . IBS (irritable bowel syndrome)   . Pulmonary fibrosis   . Sleep apnea   . COPD (chronic obstructive pulmonary disease)   . Colitis     sigmoid  . Candida esophagitis   . Diverticulitis      Family History  Problem Relation Age of Onset  . Parkinsonism Father   . Coronary artery disease Father   . Breast cancer      aunt  . Colon cancer        aunt, age 60  . Uterine cancer      aunt  . Stroke Mother   . Heart failure Mother   . Cancer Maternal Aunt     breast     History   Social History  . Marital Status: Widowed    Spouse Name: N/A    Number of Children: 3  . Years of Education: N/A   Occupational History  . retireed    Social History Main Topics  . Smoking status: Former Smoker -- 2.0 packs/day for 30 years    Types: Cigarettes    Quit date: 05/13/1983  . Smokeless tobacco: Never Used  . Alcohol Use: Yes     1-2 glaases wine per day  . Drug Use: No  . Sexually Active: Not on file   Other Topics Concern  . Not on file   Social History Narrative  . No narrative on file     Allergies  Allergen Reactions  . Ciprofloxacin Swelling  . Penicillins     REACTION: hives  . Prednisone   . Shrimp Flavor     REACTION: burning, itching, throat swelling  . Sulfamethoxazole     REACTION: Nausea/vomiting     Outpatient Prescriptions Prior to Visit  Medication Sig Dispense Refill  .  AMBULATORY NON FORMULARY MEDICATION Oxygen at bedtime       . aspirin 325 MG tablet Take 325 mg by mouth daily.        . diazepam (VALIUM) 5 MG tablet take 1 tablet by mouth at bedtime if needed for sleep  30 tablet  3  . losartan (COZAAR) 100 MG tablet TAKE 1 TABLET EVERY DAY  90 tablet  0  . mometasone (NASONEX) 50 MCG/ACT nasal spray Place 2 sprays into the nose 2 (two) times daily.  41 g  4  . omeprazole (PRILOSEC) 20 MG capsule Take 1 capsule by mouth Daily.      . polyethylene glycol (MIRALAX / GLYCOLAX) packet Take 17 g by mouth daily. At bedtime        . tiotropium (SPIRIVA) 18 MCG inhalation capsule Place 1 capsule (18 mcg total) into inhaler and inhale daily.  90 capsule  3     Review of Systems  Constitutional:   No  weight loss, night sweats,  Fevers, chills, fatigue, or  lassitude.  HEENT:   No headaches,  Difficulty swallowing,  Tooth/dental problems, or  Sore throat,             + sneezing, itching,   nasal congestion, post nasal drip,   CV:  No chest pain,  Orthopnea, PND, swelling in lower extremities, anasarca, dizziness, palpitations, syncope.   GI  No heartburn, indigestion, abdominal pain, nausea, vomiting, diarrhea, change in bowel habits, loss of appetite, bloody stools.   Resp:  ,  No coughing up of blood.     No wheezing.  No chest wall deformity  Skin: no rash or lesions.  GU: no dysuria, change in color of urine, no urgency or frequency.  No flank pain, no hematuria   MS:  No joint pain or swelling.  No decreased range of motion.  No back pain.  Psych:  No change in mood or affect. No depression or anxiety.  No memory loss.         Objective:   Physical Exam BP 122/68  Pulse 78  Temp(Src) 98.4 F (36.9 C) (Oral)  Ht 5\' 2"  (1.575 m)  Wt 124 lb 6.4 oz (56.427 kg)  BMI 22.75 kg/m2  SpO2 94%  GEN: A/Ox3; pleasant , NAD, elderly  HEENT:  [place]/AT,  EACs-clear, TMs-wnl, NOSE-clear, THROAT-clear, no lesions, no postnasal drip or exudate noted.   NECK:  Supple w/ fair ROM; no JVD; normal carotid impulses w/o bruits; no thyromegaly or nodules palpated; no lymphadenopathy.  RESP  Coarse BS w/ no wheezingno accessory muscle use, no dullness to percussion  CARD:  RRR, no m/r/g  , no peripheral edema, pulses intact, no cyanosis or clubbing.  GI:   Soft & nt; nml bowel sounds; no organomegaly or masses detected.  Musco: Warm bil, no deformities or joint swelling noted.   Neuro: alert, no focal deficits noted.    Skin: Warm, no lesions or rashes         Assessment & Plan:   DISEASE, PULMONARY D/T MYCOBACTERIA Chronic obstructive lower airways disease on the basis of prior mycobacterial lung infection with MAC and mild bronchiectasis  Plan Maintain Spiriva No additional systemic antibiotics indicated     Updated Medication List Outpatient Encounter Prescriptions as of 08/27/2011  Medication Sig Dispense Refill  . AMBULATORY NON FORMULARY MEDICATION  Oxygen at bedtime       . aspirin 325 MG tablet Take 325 mg by mouth daily.        Marland Kitchen  diazepam (VALIUM) 5 MG tablet take 1 tablet by mouth at bedtime if needed for sleep  30 tablet  3  . losartan (COZAAR) 100 MG tablet TAKE 1 TABLET EVERY DAY  90 tablet  0  . mometasone (NASONEX) 50 MCG/ACT nasal spray Place 2 sprays into the nose 2 (two) times daily.  41 g  4  . omeprazole (PRILOSEC) 20 MG capsule Take 1 capsule by mouth Daily.      . polyethylene glycol (MIRALAX / GLYCOLAX) packet Take 17 g by mouth daily. At bedtime        . tiotropium (SPIRIVA) 18 MCG inhalation capsule Place 1 capsule (18 mcg total) into inhaler and inhale daily.  90 capsule  3

## 2011-09-04 ENCOUNTER — Ambulatory Visit (INDEPENDENT_AMBULATORY_CARE_PROVIDER_SITE_OTHER): Payer: Medicare Other | Admitting: Surgery

## 2011-09-04 VITALS — BP 118/82 | HR 72 | Temp 97.9°F | Resp 12 | Ht 62.0 in | Wt 123.0 lb

## 2011-09-04 DIAGNOSIS — Z853 Personal history of malignant neoplasm of breast: Secondary | ICD-10-CM | POA: Diagnosis not present

## 2011-09-04 NOTE — Progress Notes (Signed)
[facility]  Ovidio Kin, MD,  FACS [address]  [address] [place], [place]    [address] Phone:  [phone] FAX:  [phone]   Re:   [name] [name] DOB:   08/12/1923 MRN:   [id number]  ASSESSMENT AND PLAN: 1.  . Right breast cancer. T1b, N0.   Right lumpectomy and Right SLNBx - 07/21/2007.  Disease free and doing well at 4 years from her surgery.  Though she has some pain in her right breast which is chronic.   Disease free.  I will see her back in 6 months.  2. History of gall stones. Continue to observe.  3. On home O2 at night.  4. COPD.   Sees Dr. Jonni Sanger. 5. History of left thoracotomy where she still has pain issues.  HISTORY OF PRESENT ILLNESS: Chief Complaint  Patient presents with  . Breast Cancer Long Term Follow Up    [name] [name] is a 78 y.o. (DOB: 18-Dec-1923)  white female who is a patient of Judie Petit, MD, MD and comes to me today for follow up breast cancer.  She had a right breast carcinoma which was 6 mm invasive ductal carcinoma. This was ER and PR receptor negative, Ki67 was 14%, and HER-2/neu negative. Her surgery was in July 21, 2007 and was a right breast lumpectomy and right axillary sentinel lymph node biopsy.  She has some lumpiness in the right breast.  Though to me it is not much different from the left side, just sorer.  Nothing has changed since December.  [Her daughter  works with the [employer] group and they have a concert this Saturday, 12/15, at [employer]  PHYSICAL EXAM: BP 118/82  Pulse 72  Temp(Src) 97.9 F (36.6 C) (Temporal)  Resp 12  Ht 5\' 2"  (1.575 m)  Wt 123 lb (55.792 kg)  BMI 22.50 kg/m2  HEENT:  Pupils equal.  Dentition good.  No injury. NECK:  Supple.  No thyroid mass. LYMPH NODES:  No cervical, supraclavicular, or axillary adenopathy. BREASTS -  RIGHT:  Scar at 12 o'clock.  She has some tenderness around the scar.  She always has more tenderness in the  right breast compared to the left breast.   LEFT:  No palpable mass or nodule.  No nipple discharge. UPPER EXTREMITIES:  No evidence of lymphedema.  DATA REVIEWED: Mammogram and [place] - 08/25/2011 - Negative  Ovidio Kin, MD, FACS Office:  [phone]

## 2011-09-24 ENCOUNTER — Ambulatory Visit (INDEPENDENT_AMBULATORY_CARE_PROVIDER_SITE_OTHER): Payer: Medicare Other | Admitting: Cardiology

## 2011-09-24 ENCOUNTER — Encounter: Payer: Self-pay | Admitting: Cardiology

## 2011-09-24 ENCOUNTER — Telehealth: Payer: Self-pay | Admitting: Cardiology

## 2011-09-24 VITALS — BP 142/92 | HR 65 | Ht 62.0 in | Wt 122.0 lb

## 2011-09-24 DIAGNOSIS — I10 Essential (primary) hypertension: Secondary | ICD-10-CM

## 2011-09-24 DIAGNOSIS — I208 Other forms of angina pectoris: Secondary | ICD-10-CM

## 2011-09-24 DIAGNOSIS — I2510 Atherosclerotic heart disease of native coronary artery without angina pectoris: Secondary | ICD-10-CM | POA: Diagnosis not present

## 2011-09-24 DIAGNOSIS — Z8679 Personal history of other diseases of the circulatory system: Secondary | ICD-10-CM

## 2011-09-24 DIAGNOSIS — I209 Angina pectoris, unspecified: Secondary | ICD-10-CM

## 2011-09-24 DIAGNOSIS — I2089 Other forms of angina pectoris: Secondary | ICD-10-CM | POA: Insufficient documentation

## 2011-09-24 MED ORDER — AMLODIPINE BESYLATE 2.5 MG PO TABS: 2.5000 mg | ORAL_TABLET | Freq: Every day | ORAL | Status: DC

## 2011-09-24 MED ORDER — NITROGLYCERIN 0.4 MG SL SUBL: 0.4000 mg | SUBLINGUAL_TABLET | SUBLINGUAL | Status: DC | PRN

## 2011-09-24 NOTE — Patient Instructions (Addendum)
Your physician has recommended you make the following change in your medication:  Amlodipine 2.5 mg 1 tablet every morning.  Your physician wants you to follow-up in: 1 year. You will receive a reminder letter in the mail two months in advance. If you don't receive a letter, please call our office to schedule the follow-up appointment.     Nitroglycerin sublingual tablets What is this medicine? NITROGLYCERIN (nye troe GLI ser in) is a type of vasodilator. It relaxes blood vessels, increasing the blood and oxygen supply to your heart. This medicine is used to relieve chest pain caused by angina. It is also used to prevent chest pain before activities like climbing stairs, going outdoors in cold weather, or sexual activity. . How should I use this medicine? Take this medicine by mouth as needed. At the first sign of an angina attack (chest pain or tightness) place one tablet under your tongue. You can also take this medicine 5 to 10 minutes before an event likely to produce chest pain. Follow the directions on the prescription label. Let the tablet dissolve under the tongue. Do not swallow whole. Replace the dose if you accidentally swallow it. It will help if your mouth is not dry. Saliva around the tablet will help it to dissolve more quickly. Do not eat or drink, smoke or chew tobacco while a tablet is dissolving. If you are not better within 5 minutes after taking ONE dose of nitroglycerin, call 9-1-1 immediately to seek emergency medical care. Do not take more than 3 nitroglycerin tablets over 15 minutes. If you take this medicine often to relieve symptoms of angina, your doctor or health care professional may provide you with different instructions to manage your symptoms. If symptoms do not go away after following these instructions, it is important to call 9-1-1 immediately. Do not take more than 3 nitroglycerin tablets over 15 minutes.  Tell your doctor or health care professional if you feel  your medicine is no longer working. Keep this medicine with you at all times. Sit or lie down when you take your medicine to prevent falling if you feel dizzy or faint after using it. Try to remain calm. This will help you to feel better faster. If you feel dizzy, take several deep breaths and lie down with your feet propped up, or bend forward with your head resting between your knees. You may get drowsy or dizzy. Do not drive, use machinery, or do anything that needs mental alertness until you know how this drug affects you. Do not stand or sit up quickly, especially if you are an older patient. This reduces the risk of dizzy or fainting spells. Alcohol can make you more drowsy and dizzy. Avoid alcoholic drinks. Do not treat yourself for coughs, colds, or pain while you are taking this medicine without asking your doctor or health care professional for advice.

## 2011-09-24 NOTE — Assessment & Plan Note (Signed)
Clinically having angina at rest and with exertion. We'll treat conservatively with 2.5 mg of amlodipine daily and sublingual nitroglycerin. Instructions given on issues and how to activate 911.

## 2011-09-24 NOTE — Assessment & Plan Note (Signed)
Suboptimal control. Low dose amlodipine added.

## 2011-09-24 NOTE — Telephone Encounter (Signed)
PT WAS IN TODAY AND WAS PUT  ON A NEW BP MED WANTS TO KNOW IF NEEDS TO TAKE THE LOSARTIN WITH IT OR D/C?

## 2011-09-24 NOTE — Progress Notes (Signed)
HPI [name] [name] comes in today for evaluation and management of the exertional and rest chest pressure and tightness. This is relatively new onset. One episode she had some nausea. This is different from her reflux and esophageal symptoms.  She has a lot of shortness of breath which is chronic. She has sleep apnea but cannot tolerate CPAP. She wears oxygen at night.  Blood pressures a little bit elevated today.  Past Medical History  Diagnosis Date  . ANXIETY 08/16/2008  . CAD, UNSPECIFIED SITE 10/17/2008  . CHOLELITHIASIS 08/23/2008  . COPD 01/04/2010    FeV1 101%, DLCO64% 2008  . 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 DIVERTICULOSIS-COLON 04/07/2008  . GERD 04/19/2007  . HYPERSOMNIA, ASSOCIATED WITH SLEEP APNEA 02/01/2007  . HYPERTENSION 11/12/2006  . MITRAL VALVE PROLAPSE, HX OF 10/07/2008  . NEOPLASM, MALIGNANT, BREAST, RIGHT 02/15/2008  . OSTEOPOROSIS 04/04/2008  . Raynaud's syndrome 04/25/2009  . Atrial fibrillation   . Airway obstruction   . History of diverticulitis of colon   . Tortuous colon   . Anal stricture   . Chronic constipation   . Fatty liver   . Hiatal hernia   . Esophageal stricture   . IBS (irritable bowel syndrome)   . Pulmonary fibrosis   . Sleep apnea   . COPD (chronic obstructive pulmonary disease)   . Colitis     sigmoid  . Candida esophagitis   . Diverticulitis     Current Outpatient Prescriptions  Medication Sig Dispense Refill  . AMBULATORY NON FORMULARY MEDICATION Oxygen at bedtime       . aspirin 325 MG tablet Take 325 mg by mouth daily.        . diazepam (VALIUM) 5 MG tablet take 1 tablet by mouth at bedtime if needed for sleep  30 tablet  3  . losartan (COZAAR) 100 MG tablet TAKE 1 TABLET EVERY DAY  90 tablet  0  . mometasone (NASONEX) 50 MCG/ACT nasal spray Place 2 sprays into the nose 2 (two) times daily.  41 g  4  . omeprazole (PRILOSEC) 20 MG capsule Take 1 capsule by mouth Daily.      . polyethylene glycol (MIRALAX / GLYCOLAX) packet  Take 17 g by mouth daily. At bedtime        . tiotropium (SPIRIVA) 18 MCG inhalation capsule Place 1 capsule (18 mcg total) into inhaler and inhale daily.  90 capsule  3    Allergies  Allergen Reactions  . Ciprofloxacin Swelling  . Penicillins     REACTION: hives  . Prednisone   . Shrimp Flavor     REACTION: burning, itching, throat swelling  . Sulfamethoxazole     REACTION: Nausea/vomiting    Family History  Problem Relation Age of Onset  . Parkinsonism Father   . Coronary artery disease Father   . Breast cancer      aunt  . Colon cancer      aunt, age 59  . Uterine cancer      aunt  . Stroke Mother   . Heart failure Mother   . Cancer Maternal Aunt     breast    History   Social History  . Marital Status: Widowed    Spouse Name: N/A    Number of Children: 3  . Years of Education: N/A   Occupational History  . retireed    Social History Main Topics  . Smoking status: Former Smoker -- 2.0 packs/day for 30 years    Types: Cigarettes  Quit date: 05/13/1983  . Smokeless tobacco: Never Used  . Alcohol Use: Yes     1-2 glaases wine per day  . Drug Use: No  . Sexually Active: Not on file   Other Topics Concern  . Not on file   Social History Narrative  . No narrative on file    ROS ALL NEGATIVE EXCEPT THOSE NOTED IN HPI  PE  General Appearance: well developed, well nourished in no acute distress HEENT: symmetrical face, PERRLA, good dentition  Neck: no JVD, thyromegaly, or adenopathy, trachea midline Chest: symmetric without deformity Cardiac: PMI non-displaced, RRR, normal S1, S2, no gallop or murmur Lung: clear to ausculation and percussion Vascular: all pulses full without bruits  Abdominal: nondistended, nontender, good bowel sounds, no HSM, no bruits Extremities: no cyanosis, clubbing or edema, no sign of DVT, no varicosities  Skin: normal color, no rashes Neuro: alert and oriented x 3, non-focal Pysch: normal affect  EKG Normal sinus  rhythm with first-degree AV block, right atrial enlargement,  No acute changes.BMET    Component Value Date/Time   NA 134* 03/06/2011 0605   K 3.8 03/06/2011 0605   CL 99 03/06/2011 0605   CO2 28 03/06/2011 0605   GLUCOSE 92 03/06/2011 0605   BUN 9 03/06/2011 0605   CREATININE 0.55 03/06/2011 0605   CALCIUM 9.2 03/06/2011 0605   GFRNONAA 83* 03/06/2011 0605   GFRAA >90 03/06/2011 0605    Lipid Panel     Component Value Date/Time   CHOL 158 03/03/2011 0500   TRIG 98 03/03/2011 0500   HDL 60.40 06/27/2010 1032   CHOLHDL 3 06/27/2010 1032   VLDL 17.0 06/27/2010 1032   LDLCALC 117* 06/27/2010 1032    CBC    Component Value Date/Time   WBC 6.0 03/06/2011 0605   RBC 4.30 03/06/2011 0605   HGB 12.8 03/06/2011 0605   HCT 36.6 03/06/2011 0605   PLT 207 03/06/2011 0605   MCV 85.1 03/06/2011 0605   MCH 29.8 03/06/2011 0605   MCHC 35.0 03/06/2011 0605   RDW 12.5 03/06/2011 0605   LYMPHSABS 1.5 03/03/2011 0500   MONOABS 1.0 03/03/2011 0500   EOSABS 0.3 03/03/2011 0500   BASOSABS 0.0 03/03/2011 0500

## 2011-09-26 NOTE — Telephone Encounter (Signed)
Detailed message left on home voice mail to continue losartan. Mylo Red RN

## 2011-10-03 ENCOUNTER — Ambulatory Visit: Payer: Medicare Other | Admitting: Cardiology

## 2011-10-08 DIAGNOSIS — M1710 Unilateral primary osteoarthritis, unspecified knee: Secondary | ICD-10-CM | POA: Diagnosis not present

## 2011-10-08 DIAGNOSIS — S9030XA Contusion of unspecified foot, initial encounter: Secondary | ICD-10-CM | POA: Diagnosis not present

## 2011-10-10 ENCOUNTER — Ambulatory Visit: Payer: Medicare Other | Admitting: Critical Care Medicine

## 2011-10-10 ENCOUNTER — Telehealth: Payer: Self-pay | Admitting: Critical Care Medicine

## 2011-10-10 DIAGNOSIS — J302 Other seasonal allergic rhinitis: Secondary | ICD-10-CM

## 2011-10-10 MED ORDER — MOMETASONE FUROATE 50 MCG/ACT NA SUSP: 2.0000 | Freq: Two times a day (BID) | NASAL | Status: DC

## 2011-10-10 NOTE — Telephone Encounter (Signed)
Pt returned call. She is aware that her rx has been sent to pharmacy and also was reminded of her upcoming appt with dr. Delford Field. Nothing further is needed per pt. [name] [name]

## 2011-10-10 NOTE — Telephone Encounter (Signed)
RX has been sent--lmomtcb x1 for pt to make aware 

## 2011-10-15 ENCOUNTER — Ambulatory Visit (INDEPENDENT_AMBULATORY_CARE_PROVIDER_SITE_OTHER): Payer: Medicare Other | Admitting: Critical Care Medicine

## 2011-10-15 ENCOUNTER — Encounter: Payer: Self-pay | Admitting: Critical Care Medicine

## 2011-10-15 ENCOUNTER — Ambulatory Visit: Payer: Medicare Other | Admitting: Critical Care Medicine

## 2011-10-15 VITALS — BP 110/72 | HR 71 | Temp 98.0°F | Ht 62.0 in | Wt 122.8 lb

## 2011-10-15 DIAGNOSIS — J438 Other emphysema: Secondary | ICD-10-CM

## 2011-10-15 DIAGNOSIS — J439 Emphysema, unspecified: Secondary | ICD-10-CM

## 2011-10-15 MED ORDER — CHLORPHENIRAMINE MALEATE 12 MG PO CP24: 12.0000 mg | ORAL_CAPSULE | Freq: Every day | ORAL | Status: DC

## 2011-10-15 NOTE — Progress Notes (Signed)
Subjective:    Patient ID: [name] [name], female    DOB: [birth year], 78 y.o.   MRN: [id number]  HPI  This is a 78 y.o. 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 The pt also has hx of upper airway obstruction with difficult intubation with prior operative procedures. Also hx Breast CA s/p resection and XRT. Hx Gallstone disease with conservative Rx due to difficult intubation status.   10/15/2011 Pt notes dyspnea is the same. Pt did note some congestion a few weeks ago and now is gone. Pt notes hoarse d/t pndrip.  Pt notes clear water . Pt denies any significant sore throat, nasal congestion or excess secretions, fever, chills, sweats, unintended weight loss, pleurtic or exertional chest pain, orthopnea PND, or leg swelling Pt denies any increase in rescue therapy over baseline, denies waking up needing it or having any early am or nocturnal exacerbations of coughing/wheezing/or dyspnea. Pt also denies any obvious fluctuation in symptoms with  weather or environmental change or other alleviating or aggravating factors     Past Medical History  Diagnosis Date  . ANXIETY 08/16/2008  . CAD, UNSPECIFIED SITE 10/17/2008  . CHOLELITHIASIS 08/23/2008  . COPD 01/04/2010    FeV1 101%, DLCO64% 2008  . 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 DIVERTICULOSIS-COLON 04/07/2008  . GERD 04/19/2007  . HYPERSOMNIA, ASSOCIATED WITH SLEEP APNEA 02/01/2007  . HYPERTENSION 11/12/2006  . MITRAL VALVE PROLAPSE, HX OF 10/07/2008  . NEOPLASM, MALIGNANT, BREAST, RIGHT 02/15/2008  . OSTEOPOROSIS 04/04/2008  . Raynaud's syndrome 04/25/2009  . Atrial fibrillation   . Airway obstruction   . History of diverticulitis of colon   . Tortuous colon   . Anal stricture   . Chronic constipation   . Fatty liver   . Hiatal hernia   . Esophageal stricture   . IBS (irritable bowel syndrome)   . Pulmonary fibrosis   . Sleep apnea   . COPD (chronic  obstructive pulmonary disease)   . Colitis     sigmoid  . Candida esophagitis   . Diverticulitis      Family History  Problem Relation Age of Onset  . Parkinsonism Father   . Coronary artery disease Father   . Breast cancer      aunt  . Colon cancer      aunt, age 86  . Uterine cancer      aunt  . Stroke Mother   . Heart failure Mother   . Cancer Maternal Aunt     breast     History   Social History  . Marital Status: Widowed    Spouse Name: N/A    Number of Children: 3  . Years of Education: N/A   Occupational History  . retireed    Social History Main Topics  . Smoking status: Former Smoker -- 2.0 packs/day for 30 years    Types: Cigarettes    Quit date: 05/13/1983  . Smokeless tobacco: Never Used  . Alcohol Use: Yes     1-2 glaases wine per day  . Drug Use: No  . Sexually Active: Not on file   Other Topics Concern  . Not on file   Social History Narrative  . No narrative on file     Allergies  Allergen Reactions  . Ciprofloxacin Swelling  . Penicillins     REACTION: hives  . Prednisone   . Shrimp Flavor     REACTION: burning, itching,  throat swelling  . Sulfamethoxazole     REACTION: Nausea/vomiting     Outpatient Prescriptions Prior to Visit  Medication Sig Dispense Refill  . AMBULATORY NON FORMULARY MEDICATION Oxygen at bedtime-2lpm      . amLODipine (NORVASC) 2.5 MG tablet Take 1 tablet (2.5 mg total) by mouth daily.  90 tablet  3  . aspirin 325 MG tablet Take 325 mg by mouth daily.        . diazepam (VALIUM) 5 MG tablet take 1 tablet by mouth at bedtime if needed for sleep  30 tablet  3  . losartan (COZAAR) 100 MG tablet TAKE 1 TABLET EVERY DAY  90 tablet  0  . mometasone (NASONEX) 50 MCG/ACT nasal spray Place 2 sprays into the nose 2 (two) times daily.  41 g  4  . nitroGLYCERIN (NITROSTAT) 0.4 MG SL tablet Place 1 tablet (0.4 mg total) under the tongue every 5 (five) minutes as needed.  25 tablet  11  . omeprazole (PRILOSEC) 20 MG  capsule Take 1 capsule by mouth Daily.      . polyethylene glycol (MIRALAX / GLYCOLAX) packet Take 17 g by mouth daily. At bedtime        . tiotropium (SPIRIVA) 18 MCG inhalation capsule Place 1 capsule (18 mcg total) into inhaler and inhale daily.  90 capsule  3     Review of Systems  Constitutional:   No  weight loss, night sweats,  Fevers, chills, fatigue, or  lassitude.  HEENT:   No headaches,  Difficulty swallowing,  Tooth/dental problems, or  Sore throat,             + sneezing, itching,  nasal congestion, post nasal drip,   CV:  No chest pain,  Orthopnea, PND, swelling in lower extremities, anasarca, dizziness, palpitations, syncope.   GI  No heartburn, indigestion, abdominal pain, nausea, vomiting, diarrhea, change in bowel habits, loss of appetite, bloody stools.   Resp:  ,  No coughing up of blood.     No wheezing.  No chest wall deformity  Skin: no rash or lesions.  GU: no dysuria, change in color of urine, no urgency or frequency.  No flank pain, no hematuria   MS:  No joint pain or swelling.  No decreased range of motion.  No back pain.  Psych:  No change in mood or affect. No depression or anxiety.  No memory loss.         Objective:   Physical Exam BP 110/72  Pulse 71  Temp(Src) 98 F (36.7 C) (Oral)  Ht 5\' 2"  (1.575 m)  Wt 122 lb 12.8 oz (55.702 kg)  BMI 22.46 kg/m2  SpO2 96%  GEN: A/Ox3; pleasant , NAD, elderly  HEENT:  [place]/AT,  EACs-clear, TMs-wnl, NOSE-clear, THROAT-clear, no lesions, no postnasal drip or exudate noted.   NECK:  Supple w/ fair ROM; no JVD; normal carotid impulses w/o bruits; no thyromegaly or nodules palpated; no lymphadenopathy.  RESP  Coarse BS w/ no wheezingno accessory muscle use, no dullness to percussion  CARD:  RRR, no m/r/g  , no peripheral edema, pulses intact, no cyanosis or clubbing.  GI:   Soft & nt; nml bowel sounds; no organomegaly or masses detected.  Musco: Warm bil, no deformities or joint swelling noted.    Neuro: alert, no focal deficits noted.    Skin: Warm, no lesions or rashes         Assessment & Plan:   COPD with emphysema Like obstructive  lung disease gold stage C. home oxygen dependent at night only stable at this time Chronic perennial rhinitis with postnasal drip syndrome Plan Continue Nasonex Begin chlorpheniramine 12 mg at at bedtime for postnasal drip Maintain Spiriva Maintain nocturnal oxygen Return 3 months     Updated Medication List Outpatient Encounter Prescriptions as of 10/15/2011  Medication Sig Dispense Refill  . AMBULATORY NON FORMULARY MEDICATION Oxygen at bedtime-2lpm      . amLODipine (NORVASC) 2.5 MG tablet Take 1 tablet (2.5 mg total) by mouth daily.  90 tablet  3  . aspirin 325 MG tablet Take 325 mg by mouth daily.        . diazepam (VALIUM) 5 MG tablet take 1 tablet by mouth at bedtime if needed for sleep  30 tablet  3  . losartan (COZAAR) 100 MG tablet TAKE 1 TABLET EVERY DAY  90 tablet  0  . mometasone (NASONEX) 50 MCG/ACT nasal spray Place 2 sprays into the nose 2 (two) times daily.  41 g  4  . nitroGLYCERIN (NITROSTAT) 0.4 MG SL tablet Place 1 tablet (0.4 mg total) under the tongue every 5 (five) minutes as needed.  25 tablet  11  . omeprazole (PRILOSEC) 20 MG capsule Take 1 capsule by mouth Daily.      . polyethylene glycol (MIRALAX / GLYCOLAX) packet Take 17 g by mouth daily. At bedtime        . tiotropium (SPIRIVA) 18 MCG inhalation capsule Place 1 capsule (18 mcg total) into inhaler and inhale daily.  90 capsule  3  . Chlorpheniramine Maleate 12 MG CP24 Take 1 capsule (12 mg total) by mouth at bedtime.  30 each  6

## 2011-10-15 NOTE — Assessment & Plan Note (Signed)
Like obstructive lung disease gold stage C. home oxygen dependent at night only stable at this time Chronic perennial rhinitis with postnasal drip syndrome Plan Continue Nasonex Begin chlorpheniramine 12 mg at at bedtime for postnasal drip Maintain Spiriva Maintain nocturnal oxygen Return 3 months

## 2011-10-15 NOTE — Patient Instructions (Signed)
Try Chlorpheniramine 12mg  daily at bedtime No other medication changes Return 3 months

## 2011-10-27 ENCOUNTER — Other Ambulatory Visit: Payer: Self-pay | Admitting: Internal Medicine

## 2011-10-27 ENCOUNTER — Telehealth: Payer: Self-pay | Admitting: Internal Medicine

## 2011-10-27 NOTE — Telephone Encounter (Signed)
Ok to see Padonda 

## 2011-10-27 NOTE — Telephone Encounter (Signed)
Joni Reining can you call pt and schedule with pt?  Thanks

## 2011-10-27 NOTE — Telephone Encounter (Signed)
Caller: Bluford Kaufmann; PCP: Birdie Sons; CB#: [phone]; Call regarding Sleeping Trouble Due To Feet Pain/ Cramps.  Caller does not rate pain when requested and unable to sleep due to pain x 3 weeks.   Emergent sx ruled out.  See provider in 2 weeks per Foot Non-Injury protocol. Home care for the interim and parameters for callback given. See provider within 2 weeks.   Caller insists on seeing PCP only.  Contacted  Holly at office; advised none available and to send note to provider for follow up.  Caller relates she has moved and diet changed, questions if she needs to get blood work done before September.    Caller requests that message be left on her voice mail [phone] or call  daughter's cell phone [phone].

## 2011-10-28 ENCOUNTER — Ambulatory Visit (INDEPENDENT_AMBULATORY_CARE_PROVIDER_SITE_OTHER): Payer: Medicare Other | Admitting: Family

## 2011-10-28 ENCOUNTER — Encounter: Payer: Self-pay | Admitting: Family

## 2011-10-28 VITALS — BP 120/80 | Temp 97.8°F | Wt 123.0 lb

## 2011-10-28 DIAGNOSIS — R252 Cramp and spasm: Secondary | ICD-10-CM | POA: Diagnosis not present

## 2011-10-28 DIAGNOSIS — M79609 Pain in unspecified limb: Secondary | ICD-10-CM | POA: Diagnosis not present

## 2011-10-28 DIAGNOSIS — R5381 Other malaise: Secondary | ICD-10-CM

## 2011-10-28 DIAGNOSIS — M79671 Pain in right foot: Secondary | ICD-10-CM

## 2011-10-28 DIAGNOSIS — R7309 Other abnormal glucose: Secondary | ICD-10-CM | POA: Diagnosis not present

## 2011-10-28 DIAGNOSIS — R739 Hyperglycemia, unspecified: Secondary | ICD-10-CM

## 2011-10-28 DIAGNOSIS — R5383 Other fatigue: Secondary | ICD-10-CM | POA: Diagnosis not present

## 2011-10-28 LAB — BASIC METABOLIC PANEL
CO2: 30 mEq/L (ref 19–32)
Calcium: 8.7 mg/dL (ref 8.4–10.5)
Chloride: 99 mEq/L (ref 96–112)
Sodium: 137 mEq/L (ref 135–145)

## 2011-10-28 LAB — HEMOGLOBIN A1C: Hgb A1c MFr Bld: 5.8 % (ref 4.6–6.5)

## 2011-10-28 LAB — CBC WITH DIFFERENTIAL/PLATELET
Basophils Relative: 0.2 % (ref 0.0–3.0)
Eosinophils Absolute: 0.1 10*3/uL (ref 0.0–0.7)
Eosinophils Relative: 1 % (ref 0.0–5.0)
Hemoglobin: 13.2 g/dL (ref 12.0–15.0)
Lymphocytes Relative: 23.6 % (ref 12.0–46.0)
MCHC: 32.8 g/dL (ref 30.0–36.0)
MCV: 89.9 fl (ref 78.0–100.0)
Neutro Abs: 5.9 10*3/uL (ref 1.4–7.7)
RBC: 4.48 Mil/uL (ref 3.87–5.11)
WBC: 8.7 10*3/uL (ref 4.5–10.5)

## 2011-10-28 NOTE — Patient Instructions (Signed)
Restless Legs Syndrome  Restless legs syndrome is a movement disorder. It may also be called a sensori-motor disorder.   CAUSES   No one knows what specifically causes restless legs syndrome, but it tends to run in families. It is also more common in people with low iron, in pregnancy, in people who need dialysis, and those with nerve damage (neuropathy).Some medications may make restless legs syndrome worse.Those medications include drugs to treat high blood pressure, some heart conditions, nausea, colds, allergies, and depression.  SYMPTOMS  Symptoms include uncomfortable sensations in the legs. These leg sensations are worse during periods of inactivity or rest. They are also worse while sitting or lying down. Individuals that have the disorder describe sensations in the legs that feel like:   Pulling.   Drawing.   Crawling.   Worming.   Boring.   Tingling.   Pins and needles.   Prickling.   Pain.  The sensations are usually accompanied by an overwhelming urge to move the legs. Sudden muscle jerks may also occur. Movement provides temporary relief from the discomfort. In rare cases, the arms may also be affected. Symptoms may interfere with going to sleep (sleep onset insomnia). Restless legs syndrome may also be related to periodic limb movement disorder (PLMD). PLMD is another more common motor disorder. It also causes interrupted sleep. The symptoms from PLMD usually occur most often when you are awake.  TREATMENT   Treatment for restless legs syndrome is symptomatic. This means that the symptoms are treated.    Massage and cold compresses may provide temporary relief.   Walk, stretch, or take a cold or hot bath.   Get regular exercise and a good night's sleep.   Avoid caffeine, alcohol, nicotine, and medications that can make it worse.   Do activities that provide mental stimulation like discussions, needlework, and video games. These may be helpful if you are not able to walk or  stretch.  Some medications are effective in relieving the symptoms. However, many of these medications have side effects. Ask your caregiver about medications that may help your symptoms. Correcting iron deficiency may improve symptoms for some patients.  Document Released: 04/18/2002 Document Revised: 04/17/2011 Document Reviewed: 07/25/2010  [employer] Patient Information 2012 [employer].

## 2011-10-28 NOTE — Telephone Encounter (Signed)
lmom for pt to call back and schedule

## 2011-10-29 NOTE — Progress Notes (Signed)
Subjective:    Patient ID: [name] [name], female    DOB: 09/15/1923, 78 y.o.   MRN: [id number]  HPI  78 year old white female, nonsmoker, patient of Dr. Cato Mulligan is in today with complaints of pain in both of her feet, cramps and spasms that occur at bedtime x3 nights. She also reports weakness in her legs and feeling fatigued. The discomfort goes away when she ambulates. She has not taken any medication for relief. It is better with propping her feet up on a pillow. No known history of any neuropathic disorders. Her daughter reports that she has been more fatigue in general.  Her daughter is concerned that she may not begin adequate oxygen at night related to her sleep apnea.  Review of Systems  Constitutional: Positive for fatigue.  HENT: Negative.   Respiratory: Negative.   Cardiovascular: Negative.  Negative for chest pain, palpitations and leg swelling.  Gastrointestinal: Negative.   Genitourinary: Negative.   Musculoskeletal: Positive for arthralgias.  Neurological: Positive for weakness and numbness.       Numbness and tingling in the legs at night.  Psychiatric/Behavioral: Negative.    Past Medical History  Diagnosis Date  . ANXIETY 08/16/2008  . CAD, UNSPECIFIED SITE 10/17/2008  . CHOLELITHIASIS 08/23/2008  . COPD 01/04/2010    FeV1 101%, DLCO64% 2008  . 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 DIVERTICULOSIS-COLON 04/07/2008  . GERD 04/19/2007  . HYPERSOMNIA, ASSOCIATED WITH SLEEP APNEA 02/01/2007  . HYPERTENSION 11/12/2006  . MITRAL VALVE PROLAPSE, HX OF 10/07/2008  . NEOPLASM, MALIGNANT, BREAST, RIGHT 02/15/2008  . OSTEOPOROSIS 04/04/2008  . Raynaud's syndrome 04/25/2009  . Atrial fibrillation   . Airway obstruction   . History of diverticulitis of colon   . Tortuous colon   . Anal stricture   . Chronic constipation   . Fatty liver   . Hiatal hernia   . Esophageal stricture   . IBS (irritable bowel syndrome)   . Pulmonary fibrosis   . Sleep apnea   . COPD  (chronic obstructive pulmonary disease)   . Colitis     sigmoid  . Candida esophagitis   . Diverticulitis     History   Social History  . Marital Status: Widowed    Spouse Name: N/A    Number of Children: 3  . Years of Education: N/A   Occupational History  . retireed    Social History Main Topics  . Smoking status: Former Smoker -- 2.0 packs/day for 30 years    Types: Cigarettes    Quit date: 05/13/1983  . Smokeless tobacco: Never Used  . Alcohol Use: Yes     1-2 glaases wine per day  . Drug Use: No  . Sexually Active: Not on file   Other Topics Concern  . Not on file   Social History Narrative  . No narrative on file    Past Surgical History  Procedure Date  . Abdominal hysterectomy   . Bilateral salpingoophorectomy     s/p prolapsed bladder  . Bladder surgery     prolapsed  . Thoracotomy     granuloma, pulmonary-thoracotomy  . Breast surgery 07-21-07    mastectomy (right), all margins neg and LNs neg   . Appendectomy   . Tonsillectomy   . Rotator cuff repair   . Mastectomy     right  . Breast cyst excision     left  . Mouth surgery     Family History  Problem Relation Age of Onset  .  Parkinsonism Father   . Coronary artery disease Father   . Breast cancer      aunt  . Colon cancer      aunt, age 31  . Uterine cancer      aunt  . Stroke Mother   . Heart failure Mother   . Cancer Maternal Aunt     breast    Allergies  Allergen Reactions  . Ciprofloxacin Swelling  . Penicillins     REACTION: hives  . Prednisone   . Shrimp Flavor     REACTION: burning, itching, throat swelling  . Sulfamethoxazole     REACTION: Nausea/vomiting    Current Outpatient Prescriptions on File Prior to Visit  Medication Sig Dispense Refill  . AMBULATORY NON FORMULARY MEDICATION Oxygen at bedtime-2lpm      . amLODipine (NORVASC) 2.5 MG tablet Take 1 tablet (2.5 mg total) by mouth daily.  90 tablet  3  . aspirin 325 MG tablet Take 325 mg by mouth daily.         . Chlorpheniramine Maleate 12 MG CP24 Take 1 capsule (12 mg total) by mouth at bedtime.  30 each  6  . diazepam (VALIUM) 5 MG tablet take 1 tablet by mouth at bedtime if needed  30 tablet  3  . losartan (COZAAR) 100 MG tablet TAKE 1 TABLET EVERY DAY  90 tablet  0  . mometasone (NASONEX) 50 MCG/ACT nasal spray Place 2 sprays into the nose 2 (two) times daily.  41 g  4  . nitroGLYCERIN (NITROSTAT) 0.4 MG SL tablet Place 1 tablet (0.4 mg total) under the tongue every 5 (five) minutes as needed.  25 tablet  11  . omeprazole (PRILOSEC) 20 MG capsule Take 1 capsule by mouth Daily.      . polyethylene glycol (MIRALAX / GLYCOLAX) packet Take 17 g by mouth daily. At bedtime        . tiotropium (SPIRIVA) 18 MCG inhalation capsule Place 1 capsule (18 mcg total) into inhaler and inhale daily.  90 capsule  3    BP 120/80  Temp 97.8 F (36.6 C) (Oral)  Wt 123 lb (55.792 kg)chart   Objective:   Physical Exam  Constitutional: She is oriented to person, place, and time. She appears well-developed and well-nourished.  Neck: Normal range of motion. Neck supple.  Cardiovascular: Normal rate, regular rhythm and normal heart sounds.   Pulmonary/Chest: Effort normal and breath sounds normal.  Abdominal: Soft. Bowel sounds are normal.  Musculoskeletal: Normal range of motion.  Neurological: She is alert and oriented to person, place, and time. She has normal reflexes.  Skin: Skin is warm and dry.  Psychiatric: She has a normal mood and affect.          Assessment & Plan:  Assessment: Restless leg syndrome-probable, muscle spasms, fatigue  Plan: Lab sent to include BMP, CBC, TSH, A1c when the patient in the results. Patient has a home pulse oximetry reading and she will put on her finger at night and have someone read. They will call and report those results. We'll consider Mirapex for restless legs if her labs are normal.

## 2011-11-24 ENCOUNTER — Other Ambulatory Visit: Payer: Self-pay | Admitting: Internal Medicine

## 2012-01-16 ENCOUNTER — Ambulatory Visit: Payer: Medicare Other | Admitting: Internal Medicine

## 2012-01-28 ENCOUNTER — Ambulatory Visit (INDEPENDENT_AMBULATORY_CARE_PROVIDER_SITE_OTHER): Payer: Medicare Other | Admitting: Critical Care Medicine

## 2012-01-28 ENCOUNTER — Encounter: Payer: Self-pay | Admitting: Critical Care Medicine

## 2012-01-28 VITALS — BP 122/70 | HR 63 | Temp 97.7°F | Ht 62.5 in | Wt 132.2 lb

## 2012-01-28 DIAGNOSIS — J439 Emphysema, unspecified: Secondary | ICD-10-CM

## 2012-01-28 DIAGNOSIS — J438 Other emphysema: Secondary | ICD-10-CM | POA: Diagnosis not present

## 2012-01-28 MED ORDER — CETIRIZINE HCL 10 MG PO CAPS: 10.0000 mg | ORAL_CAPSULE | Freq: Every day | ORAL | Status: DC

## 2012-01-28 MED ORDER — TIOTROPIUM BROMIDE MONOHYDRATE 18 MCG IN CAPS: 18.0000 ug | ORAL_CAPSULE | Freq: Every day | RESPIRATORY_TRACT | Status: DC

## 2012-01-28 NOTE — Progress Notes (Signed)
Subjective:    Patient ID: [name] [name], female    DOB: 1923/05/29, 78 y.o.   MRN: [id number]  HPI  This is a 78 y.o. 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 The pt also has hx of upper airway obstruction with difficult intubation with prior operative procedures. Also hx Breast CA s/p resection and XRT. Hx Gallstone disease with conservative Rx due to difficult intubation status.   10/15/2011 Pt notes dyspnea is the same. Pt did note some congestion a few weeks ago and now is gone. Pt notes hoarse d/t pndrip.  Pt notes clear water . Pt denies any significant sore throat, nasal congestion or excess secretions, fever, chills, sweats, unintended weight loss, pleurtic or exertional chest pain, orthopnea PND, or leg swelling Pt denies any increase in rescue therapy over baseline, denies waking up needing it or having any early am or nocturnal exacerbations of coughing/wheezing/or dyspnea. Pt also denies any obvious fluctuation in symptoms with  weather or environmental change or other alleviating or aggravating factors  01/28/2012 Pt with more pndrip and allergies.  Hoarseness.  Dyspnea is stable.  Pt notes more cough.   Pt notes yellow drainage from sinuses.  No chest pain. Pt denies any significant sore throat, nasal congestion or excess secretions, fever, chills, sweats, unintended weight loss, pleurtic or exertional chest pain, orthopnea PND, or leg swelling Pt denies any increase in rescue therapy over baseline, denies waking up needing it or having any early am or nocturnal exacerbations of coughing/wheezing/or dyspnea. Pt also denies any obvious fluctuation in symptoms with  weather or environmental change or other alleviating or aggravating factors      Past Medical History  Diagnosis Date  . ANXIETY 08/16/2008  . CAD, UNSPECIFIED SITE 10/17/2008  . CHOLELITHIASIS 08/23/2008  . COPD 01/04/2010    FeV1 101%, DLCO64%  2008  . 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 DIVERTICULOSIS-COLON 04/07/2008  . GERD 04/19/2007  . HYPERSOMNIA, ASSOCIATED WITH SLEEP APNEA 02/01/2007  . HYPERTENSION 11/12/2006  . MITRAL VALVE PROLAPSE, HX OF 10/07/2008  . NEOPLASM, MALIGNANT, BREAST, RIGHT 02/15/2008  . OSTEOPOROSIS 04/04/2008  . Raynaud's syndrome 04/25/2009  . Atrial fibrillation   . Airway obstruction   . History of diverticulitis of colon   . Tortuous colon   . Anal stricture   . Chronic constipation   . Fatty liver   . Hiatal hernia   . Esophageal stricture   . IBS (irritable bowel syndrome)   . Pulmonary fibrosis   . Sleep apnea   . COPD (chronic obstructive pulmonary disease)   . Colitis     sigmoid  . Candida esophagitis   . Diverticulitis      Family History  Problem Relation Age of Onset  . Parkinsonism Father   . Coronary artery disease Father   . Breast cancer      aunt  . Colon cancer      aunt, age 76  . Uterine cancer      aunt  . Stroke Mother   . Heart failure Mother   . Cancer Maternal Aunt     breast     History   Social History  . Marital Status: Widowed    Spouse Name: N/A    Number of Children: 3  . Years of Education: N/A   Occupational History  . retireed    Social History Main Topics  . Smoking status: Former Smoker -- 2.0  packs/day for 30 years    Types: Cigarettes    Quit date: 05/13/1983  . Smokeless tobacco: Never Used  . Alcohol Use: Yes     1-2 glaases wine per day  . Drug Use: No  . Sexually Active: Not on file   Other Topics Concern  . Not on file   Social History Narrative  . No narrative on file     Allergies  Allergen Reactions  . Ciprofloxacin Swelling  . Penicillins     REACTION: hives  . Prednisone   . Shrimp Flavor     REACTION: burning, itching, throat swelling  . Sulfamethoxazole     REACTION: Nausea/vomiting     Outpatient Prescriptions Prior to Visit  Medication Sig Dispense Refill  . AMBULATORY NON FORMULARY  MEDICATION Oxygen at bedtime-2lpm      . amLODipine (NORVASC) 2.5 MG tablet Take 1 tablet (2.5 mg total) by mouth daily.  90 tablet  3  . aspirin 325 MG tablet Take 325 mg by mouth daily.        . diazepam (VALIUM) 5 MG tablet take 1 tablet by mouth at bedtime if needed  30 tablet  3  . losartan (COZAAR) 100 MG tablet TAKE 1 TABLET EVERY DAY  90 tablet  0  . mometasone (NASONEX) 50 MCG/ACT nasal spray Place 2 sprays into the nose 2 (two) times daily.  41 g  4  . nitroGLYCERIN (NITROSTAT) 0.4 MG SL tablet Place 1 tablet (0.4 mg total) under the tongue every 5 (five) minutes as needed.  25 tablet  11  . omeprazole (PRILOSEC) 20 MG capsule Take 1 capsule by mouth Daily.      . polyethylene glycol powder (GLYCOLAX/MIRALAX) powder use 1/2 dose once daily  527 g  6  . Chlorpheniramine Maleate 12 MG CP24 Take 1 capsule (12 mg total) by mouth at bedtime.  30 each  6  . tiotropium (SPIRIVA) 18 MCG inhalation capsule Place 1 capsule (18 mcg total) into inhaler and inhale daily.  90 capsule  3  . polyethylene glycol (MIRALAX / GLYCOLAX) packet Take 17 g by mouth daily. At bedtime           Review of Systems  Constitutional:   No  weight loss, night sweats,  Fevers, chills, fatigue, or  lassitude.  HEENT:   No headaches,  Difficulty swallowing,  Tooth/dental problems, or  Sore throat,             + sneezing, itching,  nasal congestion, post nasal drip,   CV:  No chest pain,  Orthopnea, PND, swelling in lower extremities, anasarca, dizziness, palpitations, syncope.   GI  No heartburn, indigestion, abdominal pain, nausea, vomiting, diarrhea, change in bowel habits, loss of appetite, bloody stools.   Resp:  ,  No coughing up of blood.     No wheezing.  No chest wall deformity  Skin: no rash or lesions.  GU: no dysuria, change in color of urine, no urgency or frequency.  No flank pain, no hematuria   MS:  No joint pain or swelling.  No decreased range of motion.  No back pain.  Psych:  No change  in mood or affect. No depression or anxiety.  No memory loss.         Objective:   Physical Exam BP 122/70  Pulse 63  Temp 97.7 F (36.5 C) (Oral)  Ht 5' 2.5" (1.588 m)  Wt 132 lb 3.2 oz (59.966 kg)  BMI 23.79 kg/m2  SpO2 96%  GEN: A/Ox3; pleasant , NAD, elderly  HEENT:  [place]/AT,  EACs-clear, TMs-wnl, NOSE-clear, THROAT-clear, no lesions, no postnasal drip or exudate noted.   NECK:  Supple w/ fair ROM; no JVD; normal carotid impulses w/o bruits; no thyromegaly or nodules palpated; no lymphadenopathy.  RESP  Coarse BS w/ no wheezingno accessory muscle use, no dullness to percussion  CARD:  RRR, no m/r/g  , no peripheral edema, pulses intact, no cyanosis or clubbing.  GI:   Soft & nt; nml bowel sounds; no organomegaly or masses detected.  Musco: Warm bil, no deformities or joint swelling noted.   Neuro: alert, no focal deficits noted.    Skin: Warm, no lesions or rashes         Assessment & Plan:   COPD with emphysema Oxygen QHS dependent Copd Golds C stable Plan No change in inhaled or maintenance medications. Return in  4  months Flu vaccine per PCP    Updated Medication List Outpatient Encounter Prescriptions as of 01/28/2012  Medication Sig Dispense Refill  . AMBULATORY NON FORMULARY MEDICATION Oxygen at bedtime-2lpm      . amLODipine (NORVASC) 2.5 MG tablet Take 1 tablet (2.5 mg total) by mouth daily.  90 tablet  3  . aspirin 325 MG tablet Take 325 mg by mouth daily.        . diazepam (VALIUM) 5 MG tablet take 1 tablet by mouth at bedtime if needed  30 tablet  3  . losartan (COZAAR) 100 MG tablet TAKE 1 TABLET EVERY DAY  90 tablet  0  . mometasone (NASONEX) 50 MCG/ACT nasal spray Place 2 sprays into the nose 2 (two) times daily.  41 g  4  . nitroGLYCERIN (NITROSTAT) 0.4 MG SL tablet Place 1 tablet (0.4 mg total) under the tongue every 5 (five) minutes as needed.  25 tablet  11  . omeprazole (PRILOSEC) 20 MG capsule Take 1 capsule by mouth Daily.      .  polyethylene glycol powder (GLYCOLAX/MIRALAX) powder use 1/2 dose once daily  527 g  6  . tiotropium (SPIRIVA) 18 MCG inhalation capsule Place 1 capsule (18 mcg total) into inhaler and inhale daily.  30 capsule  11  . DISCONTD: Chlorpheniramine Maleate 12 MG CP24 Take 1 capsule (12 mg total) by mouth at bedtime.  30 each  6  . DISCONTD: tiotropium (SPIRIVA) 18 MCG inhalation capsule Place 1 capsule (18 mcg total) into inhaler and inhale daily.  90 capsule  3  . DISCONTD: tiotropium (SPIRIVA) 18 MCG inhalation capsule Place 18 mcg into inhaler and inhale daily. **Using PRN per patient**      . Cetirizine HCl (ZYRTEC ALLERGY) 10 MG CAPS Take 1 capsule (10 mg total) by mouth daily.      Marland Kitchen DISCONTD: polyethylene glycol (MIRALAX / GLYCOLAX) packet Take 17 g by mouth daily. At bedtime

## 2012-01-28 NOTE — Patient Instructions (Addendum)
Get your flu vaccine from Dr swords  Use Zyrtec one daily Stay on nasonex No change in medications. Return in          4 months

## 2012-01-29 NOTE — Assessment & Plan Note (Addendum)
Oxygen QHS dependent Copd Golds C stable Plan No change in inhaled or maintenance medications. Return in  4  months Flu vaccine per PCP

## 2012-01-30 DIAGNOSIS — M1710 Unilateral primary osteoarthritis, unspecified knee: Secondary | ICD-10-CM | POA: Diagnosis not present

## 2012-01-30 DIAGNOSIS — Z23 Encounter for immunization: Secondary | ICD-10-CM | POA: Diagnosis not present

## 2012-02-13 ENCOUNTER — Encounter: Payer: Self-pay | Admitting: Internal Medicine

## 2012-02-13 ENCOUNTER — Ambulatory Visit (INDEPENDENT_AMBULATORY_CARE_PROVIDER_SITE_OTHER): Payer: Medicare Other | Admitting: Internal Medicine

## 2012-02-13 VITALS — BP 120/70 | HR 74 | Temp 97.8°F | Wt 131.0 lb

## 2012-02-13 DIAGNOSIS — J439 Emphysema, unspecified: Secondary | ICD-10-CM

## 2012-02-13 DIAGNOSIS — I10 Essential (primary) hypertension: Secondary | ICD-10-CM

## 2012-02-13 DIAGNOSIS — J438 Other emphysema: Secondary | ICD-10-CM | POA: Diagnosis not present

## 2012-02-13 DIAGNOSIS — I2510 Atherosclerotic heart disease of native coronary artery without angina pectoris: Secondary | ICD-10-CM | POA: Diagnosis not present

## 2012-02-13 NOTE — Assessment & Plan Note (Signed)
BP Readings from Last 3 Encounters:  02/13/12 120/70  01/28/12 122/70  10/28/11 120/80   Well controlled Continue meds

## 2012-02-13 NOTE — Progress Notes (Signed)
Patient ID: [name] [name], female   DOB: 11/17/1923, 78 y.o.   MRN: [id number] Knee pain- followed by dr. Charlett Blake  Sinus congestion- uses nasonex with variable results  htn- tolerating meds  Fatigue-- she states she has no energy- She states that she isn't motivated to do anything. She doesn't deny depression but denies crying  Past Medical History  Diagnosis Date  . ANXIETY 08/16/2008  . CAD, UNSPECIFIED SITE 10/17/2008  . CHOLELITHIASIS 08/23/2008  . COPD 01/04/2010    FeV1 101%, DLCO64% 2008  . 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 DIVERTICULOSIS-COLON 04/07/2008  . GERD 04/19/2007  . HYPERSOMNIA, ASSOCIATED WITH SLEEP APNEA 02/01/2007  . HYPERTENSION 11/12/2006  . MITRAL VALVE PROLAPSE, HX OF 10/07/2008  . NEOPLASM, MALIGNANT, BREAST, RIGHT 02/15/2008  . OSTEOPOROSIS 04/04/2008  . Raynaud's syndrome 04/25/2009  . Atrial fibrillation   . Airway obstruction   . History of diverticulitis of colon   . Tortuous colon   . Anal stricture   . Chronic constipation   . Fatty liver   . Hiatal hernia   . Esophageal stricture   . IBS (irritable bowel syndrome)   . Pulmonary fibrosis   . Sleep apnea   . COPD (chronic obstructive pulmonary disease)   . Colitis     sigmoid  . Candida esophagitis   . Diverticulitis     History   Social History  . Marital Status: Widowed    Spouse Name: N/A    Number of Children: 3  . Years of Education: N/A   Occupational History  . retireed    Social History Main Topics  . Smoking status: Former Smoker -- 2.0 packs/day for 30 years    Types: Cigarettes    Quit date: 05/13/1983  . Smokeless tobacco: Never Used  . Alcohol Use: Yes     1-2 glaases wine per day  . Drug Use: No  . Sexually Active: Not on file   Other Topics Concern  . Not on file   Social History Narrative  . No narrative on file    Past Surgical History  Procedure Date  . Abdominal hysterectomy   . Bilateral salpingoophorectomy     s/p prolapsed bladder  .  Bladder surgery     prolapsed  . Thoracotomy     granuloma, pulmonary-thoracotomy  . Breast surgery 07-21-07    mastectomy (right), all margins neg and LNs neg   . Appendectomy   . Tonsillectomy   . Rotator cuff repair   . Mastectomy     right  . Breast cyst excision     left  . Mouth surgery     Family History  Problem Relation Age of Onset  . Parkinsonism Father   . Coronary artery disease Father   . Breast cancer      aunt  . Colon cancer      aunt, age 63  . Uterine cancer      aunt  . Stroke Mother   . Heart failure Mother   . Cancer Maternal Aunt     breast    Allergies  Allergen Reactions  . Ciprofloxacin Swelling  . Penicillins     REACTION: hives  . Prednisone   . Shrimp Flavor     REACTION: burning, itching, throat swelling  . Sulfamethoxazole     REACTION: Nausea/vomiting    Current Outpatient Prescriptions on File Prior to Visit  Medication Sig Dispense Refill  . AMBULATORY NON FORMULARY MEDICATION Oxygen at bedtime-2lpm      .  amLODipine (NORVASC) 2.5 MG tablet Take 1 tablet (2.5 mg total) by mouth daily.  90 tablet  3  . aspirin 325 MG tablet Take 325 mg by mouth daily.        . Cetirizine HCl (ZYRTEC ALLERGY) 10 MG CAPS Take 1 capsule (10 mg total) by mouth daily.      . diazepam (VALIUM) 5 MG tablet take 1 tablet by mouth at bedtime if needed  30 tablet  3  . losartan (COZAAR) 100 MG tablet TAKE 1 TABLET EVERY DAY  90 tablet  0  . mometasone (NASONEX) 50 MCG/ACT nasal spray Place 2 sprays into the nose 2 (two) times daily.  41 g  4  . nitroGLYCERIN (NITROSTAT) 0.4 MG SL tablet Place 1 tablet (0.4 mg total) under the tongue every 5 (five) minutes as needed.  25 tablet  11  . omeprazole (PRILOSEC) 20 MG capsule Take 1 capsule by mouth Daily.      . polyethylene glycol powder (GLYCOLAX/MIRALAX) powder use 1/2 dose once daily  527 g  6  . tiotropium (SPIRIVA) 18 MCG inhalation capsule Place 1 capsule (18 mcg total) into inhaler and inhale daily.  30  capsule  11     patient denies chest pain, shortness of breath, orthopnea. Denies lower extremity edema, abdominal pain, change in appetite, change in bowel movements. Patient denies rashes, musculoskeletal complaints. No other specific complaints in a complete review of systems.   BP 120/70  Pulse 74  Temp 97.8 F (36.6 C) (Oral)  Wt 131 lb (59.421 kg)  Well-developed well-nourished female in no acute distress. HEENT exam atraumatic, normocephalic, extraocular muscles are intact. Neck is supple. No jugular venous distention no thyromegaly. Chest clear to auscultation without increased work of breathing. Cardiac exam S1 and S2 are regular. Abdominal exam active bowel sounds, soft, nontender. Extremities no edema.

## 2012-02-13 NOTE — Assessment & Plan Note (Signed)
No sxs. No need for further evaluation 

## 2012-02-13 NOTE — Assessment & Plan Note (Signed)
She continues to use home oxygen  She complains of fatigue- i think this is due to deconditioning and inactivity

## 2012-02-16 ENCOUNTER — Encounter: Payer: Self-pay | Admitting: Internal Medicine

## 2012-02-21 ENCOUNTER — Other Ambulatory Visit: Payer: Self-pay | Admitting: Internal Medicine

## 2012-02-23 ENCOUNTER — Ambulatory Visit (INDEPENDENT_AMBULATORY_CARE_PROVIDER_SITE_OTHER)
Admission: RE | Admit: 2012-02-23 | Discharge: 2012-02-23 | Disposition: A | Discharge status: Home / Self Care | Payer: Medicare Other | Source: Ambulatory Visit | Attending: Adult Health | Admitting: Adult Health

## 2012-02-23 ENCOUNTER — Ambulatory Visit (INDEPENDENT_AMBULATORY_CARE_PROVIDER_SITE_OTHER): Payer: Medicare Other | Admitting: Adult Health

## 2012-02-23 ENCOUNTER — Telehealth: Payer: Self-pay | Admitting: Critical Care Medicine

## 2012-02-23 ENCOUNTER — Encounter: Payer: Self-pay | Admitting: Adult Health

## 2012-02-23 ENCOUNTER — Ambulatory Visit: Payer: Medicare Other | Admitting: Pulmonary Disease

## 2012-02-23 VITALS — BP 126/72 | HR 75 | Temp 98.0°F | Ht 62.5 in | Wt 132.0 lb

## 2012-02-23 DIAGNOSIS — J40 Bronchitis, not specified as acute or chronic: Secondary | ICD-10-CM | POA: Diagnosis not present

## 2012-02-23 DIAGNOSIS — J439 Emphysema, unspecified: Secondary | ICD-10-CM

## 2012-02-23 DIAGNOSIS — J438 Other emphysema: Secondary | ICD-10-CM | POA: Diagnosis not present

## 2012-02-23 MED ORDER — AZITHROMYCIN 250 MG PO TABS: ORAL_TABLET | ORAL | Status: AC

## 2012-02-23 NOTE — Telephone Encounter (Signed)
Pt here now to be seen now. Will sign off message

## 2012-02-23 NOTE — Assessment & Plan Note (Signed)
Mild Flare  Check cxr today   Plan Zpack take as directed  Mucinex DM Twice daily  As needed  Cough/congestion  Fluids and rest  Saline nasal rinses daily  I will call with xray results.  follow up Dr. Delford Field  In 6 weeks  Please contact office for sooner follow up if symptoms do not improve or worsen or seek emergency care

## 2012-02-23 NOTE — Progress Notes (Signed)
Subjective:    Patient ID: [name] [name], female    DOB: 12-Nov-1923, 78 y.o.   MRN: [id number]  HPI  This is a 78 y.o. 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 The pt also has hx of upper airway obstruction with difficult intubation with prior operative procedures. Also hx Breast CA s/p resection and XRT. Hx Gallstone disease with conservative Rx due to difficult intubation status.   10/15/2011 Pt notes dyspnea is the same. Pt did note some congestion a few weeks ago and now is gone. Pt notes hoarse d/t pndrip.  Pt notes clear water .  01/28/2012 Pt with more pndrip and allergies.  Hoarseness.  Dyspnea is stable.  Pt notes more cough.   Pt notes yellow drainage from sinuses.  No chest pain.  02/23/2012 Acute OV  Pt walked in today to be seen , [facility]  Complains of sore throat , drainage, cough, stuffy nose, drippy nose, coughing up thick yellow mucus for 1 week, worse for last 2 days.  No hemoptysis , edema or chest pain.  OTC cold meds not working.  She is very worried this will lead to PNA, requests cxr today.      Past Medical History  Diagnosis Date  . ANXIETY 08/16/2008  . CAD, UNSPECIFIED SITE 10/17/2008  . CHOLELITHIASIS 08/23/2008  . COPD 01/04/2010    FeV1 101%, DLCO64% 2008  . 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 DIVERTICULOSIS-COLON 04/07/2008  . GERD 04/19/2007  . HYPERSOMNIA, ASSOCIATED WITH SLEEP APNEA 02/01/2007  . HYPERTENSION 11/12/2006  . MITRAL VALVE PROLAPSE, HX OF 10/07/2008  . NEOPLASM, MALIGNANT, BREAST, RIGHT 02/15/2008  . OSTEOPOROSIS 04/04/2008  . Raynaud's syndrome 04/25/2009  . Atrial fibrillation   . Airway obstruction   . History of diverticulitis of colon   . Tortuous colon   . Anal stricture   . Chronic constipation   . Fatty liver   . Hiatal hernia   . Esophageal stricture   . IBS (irritable bowel syndrome)   . Pulmonary  fibrosis   . Sleep apnea   . COPD (chronic obstructive pulmonary disease)   . Colitis     sigmoid  . Candida esophagitis   . Diverticulitis      Family History  Problem Relation Age of Onset  . Parkinsonism Father   . Coronary artery disease Father   . Breast cancer      aunt  . Colon cancer      aunt, age 35  . Uterine cancer      aunt  . Stroke Mother   . Heart failure Mother   . Cancer Maternal Aunt     breast     History   Social History  . Marital Status: Widowed    Spouse Name: N/A    Number of Children: 3  . Years of Education: N/A   Occupational History  . retireed    Social History Main Topics  . Smoking status: Former Smoker -- 2.0 packs/day for 30 years    Types: Cigarettes    Quit date: 05/13/1983  . Smokeless tobacco: Never Used  . Alcohol Use: Yes     1-2 glaases wine per day  . Drug Use: No  . Sexually Active: Not on file   Other Topics Concern  . Not on file   Social History Narrative   Living in assisted living  Allergies  Allergen Reactions  . Ciprofloxacin Swelling  . Penicillins     REACTION: hives  . Prednisone   . Shrimp Flavor     REACTION: burning, itching, throat swelling  . Sulfamethoxazole     REACTION: Nausea/vomiting     Outpatient Prescriptions Prior to Visit  Medication Sig Dispense Refill  . AMBULATORY NON FORMULARY MEDICATION Oxygen at bedtime-2lpm      . amLODipine (NORVASC) 2.5 MG tablet Take 1 tablet (2.5 mg total) by mouth daily.  90 tablet  3  . aspirin 325 MG tablet Take 325 mg by mouth daily.        . Cetirizine HCl (ZYRTEC ALLERGY) 10 MG CAPS Take 1 capsule (10 mg total) by mouth daily.      . diazepam (VALIUM) 5 MG tablet take 1 tablet by mouth at bedtime if needed  30 tablet  3  . losartan (COZAAR) 100 MG tablet TAKE 1 TABLET EVERY DAY  90 tablet  0  . meloxicam (MOBIC) 15 MG tablet Take 15 mg by mouth daily.      . mometasone (NASONEX) 50 MCG/ACT nasal spray Place 2 sprays into the nose 2 (two)  times daily.  41 g  4  . nitroGLYCERIN (NITROSTAT) 0.4 MG SL tablet Place 1 tablet (0.4 mg total) under the tongue every 5 (five) minutes as needed.  25 tablet  11  . omeprazole (PRILOSEC) 20 MG capsule Take 1 capsule by mouth Daily.      . polyethylene glycol powder (GLYCOLAX/MIRALAX) powder use 1/2 dose once daily  527 g  6  . tiotropium (SPIRIVA) 18 MCG inhalation capsule Place 1 capsule (18 mcg total) into inhaler and inhale daily.  30 capsule  11     Review of Systems  Constitutional:   No  weight loss, night sweats,  Fevers, chills, fatigue, or  lassitude.  HEENT:   No headaches,  Difficulty swallowing,  Tooth/dental problems, or  Sore throat,             + sneezing, itching,  nasal congestion, post nasal drip,   CV:  No chest pain,  Orthopnea, PND, swelling in lower extremities, anasarca, dizziness, palpitations, syncope.   GI  No heartburn, indigestion, abdominal pain, nausea, vomiting, diarrhea, change in bowel habits, loss of appetite, bloody stools.   Resp:  ,  No coughing up of blood.     No wheezing.  No chest wall deformity  Skin: no rash or lesions.  GU: no dysuria, change in color of urine, no urgency or frequency.  No flank pain, no hematuria   MS:  No joint pain or swelling.  No decreased range of motion.  No back pain.  Psych:  No change in mood or affect. No depression or anxiety.  No memory loss.         Objective:   Physical Exam  GEN: A/Ox3; pleasant , NAD, elderly  HEENT:  [place]/AT,  EACs-clear, TMs-wnl, NOSE-clear, THROAT-clear, no lesions, no postnasal drip or exudate noted.   NECK:  Supple w/ fair ROM; no JVD; normal carotid impulses w/o bruits; no thyromegaly or nodules palpated; no lymphadenopathy.  RESP  Coarse BS w/ no wheezingno accessory muscle use, no dullness to percussion  CARD:  RRR, no m/r/g  , no peripheral edema, pulses intact, no cyanosis or clubbing.  GI:   Soft & nt; nml bowel sounds; no organomegaly or masses detected.  Musco:  Warm bil, no deformities or joint swelling noted.   Neuro: alert,  no focal deficits noted.    Skin: Warm, no lesions or rashes         Assessment & Plan:

## 2012-02-23 NOTE — Telephone Encounter (Signed)
I spoke with pt and ordinally scheduled her to see KC at 11:30. Per pt she has to find a ride to bring her. She is not sure when she will be able to make it here. I advised her will cancel the appt for 11:30 w/ KC and to call us back once she knows when someone can bring her. She voiced her understanding. Will await call back.

## 2012-02-23 NOTE — Patient Instructions (Addendum)
Zpack take as directed  Mucinex DM Twice daily  As needed  Cough/congestion  Fluids and rest  Saline nasal rinses daily  I will call with xray results.  follow up Dr. Delford Field  In 6 weeks  Please contact office for sooner follow up if symptoms do not improve or worsen or seek emergency care

## 2012-02-25 ENCOUNTER — Other Ambulatory Visit: Payer: Self-pay | Admitting: Internal Medicine

## 2012-02-25 NOTE — Progress Notes (Signed)
Quick Note:  Called spoke with patient, advised of cxr results / recs as stated by TP. Pt verbalized her understanding and denied any questions. ______ 

## 2012-03-01 ENCOUNTER — Telehealth: Payer: Self-pay | Admitting: Adult Health

## 2012-03-01 MED ORDER — AZITHROMYCIN 250 MG PO TABS: ORAL_TABLET | ORAL | Status: DC

## 2012-03-01 NOTE — Telephone Encounter (Signed)
agree

## 2012-03-01 NOTE — Telephone Encounter (Signed)
I spoke with pt and is aware of TP recs. I have sent RX into the pharmacy. Pt will call for appt if she is not feeling any better after this round of ABX. Will forward to Dr. Delford Field as an Lorain Childes

## 2012-03-01 NOTE — Telephone Encounter (Signed)
I spoke with the pt and she states she finished antibiotics yesterday. Pt states she feels better, but is still having an increased productive cough with yellow phlegm and a lot of chest tightness and fatigue. Pt is concerned that she is still having these symptoms.  Pt states her sob is improved. Pt states she has not used the mucinex as directed but is using saline nasal rinses. Pt feels she would benefit from more antibiotics. Please advise.Carron Curie, CMA Allergies  Allergen Reactions  . Ciprofloxacin Swelling  . Penicillins     REACTION: hives  . Prednisone   . Shrimp Flavor     REACTION: burning, itching, throat swelling  . Sulfamethoxazole     REACTION: Nausea/vomiting

## 2012-03-01 NOTE — Telephone Encounter (Signed)
May have Zpack #1 take as directed.  If not any better will need ov with Dr. Delford Field   mucinex Twice daily  As needed   Please contact office for sooner follow up if symptoms do not improve or worsen or seek emergency care

## 2012-03-31 DIAGNOSIS — H26499 Other secondary cataract, unspecified eye: Secondary | ICD-10-CM | POA: Diagnosis not present

## 2012-04-01 ENCOUNTER — Encounter (INDEPENDENT_AMBULATORY_CARE_PROVIDER_SITE_OTHER): Payer: Self-pay | Admitting: Surgery

## 2012-04-01 ENCOUNTER — Ambulatory Visit (INDEPENDENT_AMBULATORY_CARE_PROVIDER_SITE_OTHER): Payer: Medicare Other | Admitting: Surgery

## 2012-04-01 VITALS — BP 128/72 | HR 60 | Temp 97.7°F | Resp 18 | Ht 61.0 in | Wt 132.2 lb

## 2012-04-01 DIAGNOSIS — Z853 Personal history of malignant neoplasm of breast: Secondary | ICD-10-CM

## 2012-04-01 NOTE — Progress Notes (Signed)
[facility]  Ovidio Kin, MD,  FACS [address] [place]  [address] [place], [place]    [address] Phone:  [phone] FAX:  [phone]   Re:   [name] [name] DOB:   11/16/1923 MRN:   [id number]  ASSESSMENT AND PLAN: 1.  . Right breast cancer. T1b, N0.   Right lumpectomy and Right SLNBx - 07/21/2007.  Disease free and doing well at 4 1/2 years from her surgery.  Though she has some pain in her right breast which is chronic.   Disease free.  I will see her back in 6 months.  2. History of gall stones. Continue to observe.  3. On home O2 at night.  4. COPD.   Sees Dr. Jonni Sanger. 5. History of left thoracotomy where she still has pain issues. 6.  5 mm mole over right clavicle  She would like this removed and we will schedule at our office.  HISTORY OF PRESENT ILLNESS: Chief Complaint  Patient presents with  . Breast Cancer Long Term Follow Up    [name] [name] is a 78 y.o. (DOB: 1923/06/13)  white female who is a patient of Judie Petit, MD and comes to me today for follow up breast cancer.  She has done well with no new concern or lump.  She had a right breast carcinoma which was 6 mm invasive ductal carcinoma. This was ER and PR receptor negative, Ki67 was 14%, and HER-2/neu negative. Her surgery was in July 21, 2007 and was a right breast lumpectomy and right axillary sentinel lymph node biopsy.  She has some lumpiness in the right breast.  Though to me, the breast is not much different from the left side, just sorer.    Her daughter  works with the [employer] group and they have a concert Saturday, 12/15, at [place]  PHYSICAL EXAM: BP 128/72  Pulse 60  Temp 97.7 F (36.5 C) (Oral)  Resp 18  Ht 5\' 1"  (1.549 m)  Wt 132 lb 3.2 oz (59.966 kg)  BMI 24.98 kg/m2  HEENT:  Pupils equal.  Dentition good.  No injury. NECK:  Supple.  No thyroid mass. LYMPH NODES:  No cervical, supraclavicular, or axillary adenopathy. BREASTS -  RIGHT:   Scar at 12 o'clock.  She has some tenderness around the scar and medial right breast.  She always has more tenderness in the right breast compared to the left breast.   LEFT:  No palpable mass or nodule.  No nipple discharge. UPPER EXTREMITIES:  No evidence of lymphedema.  DATA REVIEWED: Mammogram and [place] - 08/25/2011 - Negative.  She is for mammograms in Feb 2014.  Ovidio Kin, MD, FACS Office:  [phone]

## 2012-04-06 ENCOUNTER — Telehealth (INDEPENDENT_AMBULATORY_CARE_PROVIDER_SITE_OTHER): Payer: Self-pay

## 2012-04-06 NOTE — Telephone Encounter (Signed)
Pt aware of appt 04/15/12 11:00 . Dr Ezzard Standing will be coming from surgery to remove her mole on her chest. [facility] are aware of Date/time.

## 2012-04-15 ENCOUNTER — Other Ambulatory Visit (INDEPENDENT_AMBULATORY_CARE_PROVIDER_SITE_OTHER): Payer: Self-pay | Admitting: Surgery

## 2012-04-15 ENCOUNTER — Encounter (INDEPENDENT_AMBULATORY_CARE_PROVIDER_SITE_OTHER): Payer: Self-pay | Admitting: Surgery

## 2012-04-15 ENCOUNTER — Ambulatory Visit (INDEPENDENT_AMBULATORY_CARE_PROVIDER_SITE_OTHER): Payer: Medicare Other | Admitting: Surgery

## 2012-04-15 VITALS — BP 126/74 | HR 66 | Temp 97.0°F | Resp 18 | Ht 61.0 in | Wt 130.8 lb

## 2012-04-15 DIAGNOSIS — C4359 Malignant melanoma of other part of trunk: Secondary | ICD-10-CM

## 2012-04-15 DIAGNOSIS — L989 Disorder of the skin and subcutaneous tissue, unspecified: Secondary | ICD-10-CM

## 2012-04-15 NOTE — Addendum Note (Signed)
Addended byEldridge Scot on: 04/15/2012 02:24 PM   Modules accepted: Orders

## 2012-04-15 NOTE — Progress Notes (Signed)
*   No surgery found *  12:09 PM  PATIENT:  Karilyn Wind, 78 y.o., female, MRN: [id number]  PREOP DIAGNOSIS:  7 mm lesion right medial clavicular area  POSTOP DIAGNOSIS:   7 mm lesion right medial clavicular area  PROCEDURE:   Excision of right medial clavicular skin lesion.  SURGEON:   Ovidio Kin, M.D.  ANESTHESIA:   4 cc 1% xylocaine  EBL:  minimal  ml  SPECIMEN:   Skin lesion  INDICATIONS FOR PROCEDURE:  Ezmeralda Stefanick is a 78 y.o. (DOB: Jan 03, 1924) white female whose primary care physician is Judie Petit, MD and comes for excision of lesion at the medial right clavicular area.  Her daughter is with her.   The indications and risks of the surgery were explained to the patient.  The risks include, but are not limited to, infection, bleeding, and nerve injury.   She will see me back in 1 to 2 weeks for wound check.

## 2012-04-21 DIAGNOSIS — M1710 Unilateral primary osteoarthritis, unspecified knee: Secondary | ICD-10-CM | POA: Diagnosis not present

## 2012-04-28 ENCOUNTER — Encounter (INDEPENDENT_AMBULATORY_CARE_PROVIDER_SITE_OTHER): Payer: Self-pay | Admitting: Surgery

## 2012-04-28 ENCOUNTER — Ambulatory Visit (INDEPENDENT_AMBULATORY_CARE_PROVIDER_SITE_OTHER): Payer: Medicare Other | Admitting: Surgery

## 2012-04-28 VITALS — BP 118/72 | HR 76 | Temp 97.6°F | Resp 24 | Ht 62.0 in | Wt 133.4 lb

## 2012-04-28 DIAGNOSIS — M1710 Unilateral primary osteoarthritis, unspecified knee: Secondary | ICD-10-CM | POA: Diagnosis not present

## 2012-04-28 DIAGNOSIS — C4359 Malignant melanoma of other part of trunk: Secondary | ICD-10-CM

## 2012-04-28 NOTE — Progress Notes (Addendum)
[facility]  Ovidio Kin, MD,  FACS [address] [place]  [address] [place], [place]    [address] Phone:  [phone] FAX:  [phone]   Re:   [name] [name] DOB:   Jul 06, 1923 MRN:   [id number]  ASSESSMENT AND PLAN: 1.  . Right breast cancer. T1b, N0.   Right lumpectomy and Right SLNBx - 07/21/2007.  Disease free and doing well at 4 1/2 years from her surgery.  Though she has some pain in her right breast which is chronic.   Disease free.  I will see her back in 6 months.  2. Right subclavicular melanoma  Breslow's level - 0.5 mm  T1a N0  Comes for follow up.  She will need a wider excision to clear the margins.  I explained the indications and risks of further surgery.  I will schedule this at one of the outpatient surgical centers.  3. History of gall stones. Continue to observe.  4. On home O2 at night.  5. COPD.   Sees Dr. Jonni Sanger.  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 Per Dr. Lynelle Doctor note.  DN 05/20/2012] 6. History of left thoracotomy where she still has pain issues.  [This pt is cleared for planned operative intervention on melanoma pulmonary wise. She has a difficult airway so if you plan intubation she would need anesthesia preop consult.  From Danise Mina.  DN 05/21/2011]   HISTORY OF PRESENT ILLNESS: Chief Complaint  Patient presents with  . Follow-up    reck rt chest incision / path report    [name] [name] is a 78 y.o. (DOB: Sep 22, 1923)  white female who is a patient of Judie Petit, MD and comes to me today for follow up of a right subclavian biopsy which proved to be melanoma.  She will need a wider excision.  She showed me other moles, but they all look benign.  She is excited about her granddaughter's wedding.  Breast history: She had a right breast carcinoma which was 6 mm invasive ductal carcinoma. This was ER and PR receptor negative, Ki67 was 14%, and HER-2/neu negative. Her surgery was in  July 21, 2007 and was a right breast lumpectomy and right axillary sentinel lymph node biopsy.  She has some lumpiness in the right breast.  Though to me, the breast is not much different from the left side, just sorer.    Social history: Daughter is with her.  Her granddaughter is getting married on December 28. Her daughter  works with the [employer] group and they have a concert Saturday, 12/15, at [employer]. [employer]  PHYSICAL EXAM: BP 118/72  Pulse 76  Temp 97.6 F (36.4 C) (Temporal)  Resp 24  Ht 5\' 2"  (1.575 m)  Wt 133 lb 6.4 oz (60.51 kg)  BMI 24.40 kg/m2  Right subclavicular area - wound looks good I stripped the patient down and examined her entire dermis.  She does have a lot of moles of her trunk, but none look suspicious.  I took a Building services engineer of her back.  DATA REVIEWED: Path to patient.  Ovidio Kin, MD, FACS Office:  [phone]

## 2012-05-07 ENCOUNTER — Ambulatory Visit: Payer: Medicare Other | Admitting: Internal Medicine

## 2012-05-10 DIAGNOSIS — M1710 Unilateral primary osteoarthritis, unspecified knee: Secondary | ICD-10-CM | POA: Diagnosis not present

## 2012-05-13 ENCOUNTER — Telehealth: Payer: Self-pay | Admitting: Internal Medicine

## 2012-05-13 NOTE — Telephone Encounter (Signed)
Pt is having melanoma surgery on 05-28-2012. Pt would like to see MD before surgery. Pt decline to see another provider. Where can I sch pt at?

## 2012-05-15 NOTE — Telephone Encounter (Signed)
Have her see padonda or dr. Selena Batten

## 2012-05-17 NOTE — Telephone Encounter (Signed)
Pt is sch for 05-20-2012 Dr Selena Batten

## 2012-05-19 ENCOUNTER — Encounter: Payer: Self-pay | Admitting: Critical Care Medicine

## 2012-05-19 ENCOUNTER — Ambulatory Visit (INDEPENDENT_AMBULATORY_CARE_PROVIDER_SITE_OTHER)
Admission: RE | Admit: 2012-05-19 | Discharge: 2012-05-19 | Disposition: A | Discharge status: Home / Self Care | Payer: Medicare Other | Source: Ambulatory Visit | Attending: Critical Care Medicine | Admitting: Critical Care Medicine

## 2012-05-19 ENCOUNTER — Ambulatory Visit (INDEPENDENT_AMBULATORY_CARE_PROVIDER_SITE_OTHER): Payer: Medicare Other | Admitting: Critical Care Medicine

## 2012-05-19 VITALS — BP 130/76 | HR 74 | Temp 98.4°F | Ht 62.0 in | Wt 132.0 lb

## 2012-05-19 DIAGNOSIS — J449 Chronic obstructive pulmonary disease, unspecified: Secondary | ICD-10-CM | POA: Diagnosis not present

## 2012-05-19 DIAGNOSIS — J438 Other emphysema: Secondary | ICD-10-CM | POA: Diagnosis not present

## 2012-05-19 DIAGNOSIS — J439 Emphysema, unspecified: Secondary | ICD-10-CM

## 2012-05-19 NOTE — Patient Instructions (Addendum)
Chest xray today No change in medications I will speak to Dr Ezzard Standing regarding your pulmonary risk Return 4 months

## 2012-05-19 NOTE — Progress Notes (Signed)
Subjective:    Patient ID: [name] [name], female    DOB: 09/30/1923, 79 y.o.   MRN: [id number]  HPI  This is a 79 y.o. 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 The pt also has hx of upper airway obstruction with difficult intubation with prior operative procedures. Also hx Breast CA s/p resection and XRT. Hx Gallstone disease with conservative Rx due to difficult intubation status.   05/19/2012 Pt dx melanoma R upper chest area.  Pt scheduled [facility].  Conscious sedation and local anesthesia.   Since last OV:  More dyspneic with exertion. Pt is hoarse.  Pt notes some pndrip. Pt had recent URI.  Dyspneic with exertion.  Pt feels weaker.  Legs not moving well.    Past Medical History  Diagnosis Date  . ANXIETY 08/16/2008  . CAD, UNSPECIFIED SITE 10/17/2008  . CHOLELITHIASIS 08/23/2008  . COPD 01/04/2010    FeV1 101%, DLCO64% 2008  . 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 DIVERTICULOSIS-COLON 04/07/2008  . GERD 04/19/2007  . HYPERSOMNIA, ASSOCIATED WITH SLEEP APNEA 02/01/2007  . HYPERTENSION 11/12/2006  . MITRAL VALVE PROLAPSE, HX OF 10/07/2008  . NEOPLASM, MALIGNANT, BREAST, RIGHT 02/15/2008  . OSTEOPOROSIS 04/04/2008  . Raynaud's syndrome 04/25/2009  . Atrial fibrillation   . Airway obstruction   . History of diverticulitis of colon   . Tortuous colon   . Anal stricture   . Chronic constipation   . Fatty liver   . Hiatal hernia   . Esophageal stricture   . IBS (irritable bowel syndrome)   . Pulmonary fibrosis   . Sleep apnea   . COPD (chronic obstructive pulmonary disease)   . Colitis     sigmoid  . Candida esophagitis   . Diverticulitis      Family History  Problem Relation Age of Onset  . Parkinsonism Father   . Coronary artery disease Father   . Breast cancer      aunt  . Colon cancer      aunt, age 41  . Uterine cancer      aunt  . Stroke Mother   . Heart failure Mother   . Cancer  Maternal Aunt     breast     History   Social History  . Marital Status: Widowed    Spouse Name: N/A    Number of Children: 3  . Years of Education: N/A   Occupational History  . retireed    Social History Main Topics  . Smoking status: Former Smoker -- 2.0 packs/day for 30 years    Types: Cigarettes    Quit date: 05/13/1983  . Smokeless tobacco: Never Used  . Alcohol Use: Yes     Comment: 1-2 glaases wine per day  . Drug Use: No  . Sexually Active: Not on file   Other Topics Concern  . Not on file   Social History Narrative   Living in assisted living     Allergies  Allergen Reactions  . Ciprofloxacin Swelling  . Penicillins     REACTION: hives  . Prednisone   . Sulfamethoxazole     REACTION: Nausea/vomiting     Outpatient Prescriptions Prior to Visit  Medication Sig Dispense Refill  . AMBULATORY NON FORMULARY MEDICATION Oxygen at bedtime-2lpm      . amLODipine (NORVASC) 2.5 MG tablet Take 1 tablet (2.5 mg total) by mouth daily.  90 tablet  3  . aspirin 325 MG  tablet Take 325 mg by mouth daily.        . Cetirizine HCl (ZYRTEC ALLERGY) 10 MG CAPS Take 1 capsule (10 mg total) by mouth daily.      . diazepam (VALIUM) 5 MG tablet take 1 tablet by mouth at bedtime if needed  30 tablet  3  . losartan (COZAAR) 100 MG tablet TAKE 1 TABLET EVERY DAY  90 tablet  0  . meloxicam (MOBIC) 15 MG tablet Take 15 mg by mouth daily.      . mometasone (NASONEX) 50 MCG/ACT nasal spray Place 2 sprays into the nose 2 (two) times daily.  41 g  4  . nitroGLYCERIN (NITROSTAT) 0.4 MG SL tablet Place 1 tablet (0.4 mg total) under the tongue every 5 (five) minutes as needed.  25 tablet  11  . omeprazole (PRILOSEC) 20 MG capsule Take 1 capsule by mouth Daily.      . polyethylene glycol powder (GLYCOLAX/MIRALAX) powder use 1/2 dose once daily  527 g  6  . tiotropium (SPIRIVA) 18 MCG inhalation capsule Place 1 capsule (18 mcg total) into inhaler and inhale daily.  30 capsule  11  .  [DISCONTINUED] azithromycin (ZITHROMAX) 250 MG tablet Take as directed  6 each  0  Last reviewed on 05/19/2012 12:17 PM by Storm Frisk, MD   Review of Systems  Constitutional:   No  weight loss, night sweats,  Fevers, chills, fatigue, or  lassitude.  HEENT:   No headaches,  Difficulty swallowing,  Tooth/dental problems, or  Sore throat,             + sneezing, itching,  nasal congestion, post nasal drip,   CV:  No chest pain,  Orthopnea, PND, swelling in lower extremities, anasarca, dizziness, palpitations, syncope.   GI  No heartburn, indigestion, abdominal pain, nausea, vomiting, diarrhea, change in bowel habits, loss of appetite, bloody stools.   Resp:  ,  No coughing up of blood.     No wheezing.  No chest wall deformity  Skin: no rash or lesions.  GU: no dysuria, change in color of urine, no urgency or frequency.  No flank pain, no hematuria   MS:  No joint pain or swelling.  No decreased range of motion.  No back pain.  Psych:  No change in mood or affect. No depression or anxiety.  No memory loss.         Objective:   Physical Exam BP 130/76  Pulse 74  Temp 98.4 F (36.9 C) (Oral)  Ht 5\' 2"  (1.575 m)  Wt 59.875 kg (132 lb)  BMI 24.14 kg/m2  SpO2 97%  GEN: A/Ox3; pleasant , NAD, elderly  HEENT:  /AT,  EACs-clear, TMs-wnl, NOSE-clear, THROAT-clear, no lesions, no postnasal drip or exudate noted.   NECK:  Supple w/ fair ROM; no JVD; normal carotid impulses w/o bruits; no thyromegaly or nodules palpated; no lymphadenopathy.  RESP  Coarse BS w/ no wheezingno accessory muscle use, no dullness to percussion  CARD:  RRR, no m/r/g  , no peripheral edema, pulses intact, no cyanosis or clubbing.  GI:   Soft & nt; nml bowel sounds; no organomegaly or masses detected.  Musco: Warm bil, no deformities or joint swelling noted.   Neuro: alert, no focal deficits noted.    Skin: Warm, no lesions or rashes       Dg Chest 2 View  05/19/2012  *RADIOLOGY REPORT*   Clinical Data: COPD.  CHEST - 2 VIEW  Comparison:  February 23, 2012; February 28, 2009.  Findings: Stable cardiomegaly.  Surgical clips in right axillary region.  Stable scarring and postsurgical changes seen involving left lung.  No acute abnormality is noted.  IMPRESSION: No acute cardiopulmonary abnormality seen.   Original Report Authenticated By: Lupita Raider.,  M.D.      Assessment & Plan:   COPD with emphysema Stable golds C Copd Plan No change in inhaled meds CXR is stable Pt is clear for planned OR on melanoma on chest    Updated Medication List Outpatient Encounter Prescriptions as of 05/19/2012  Medication Sig Dispense Refill  . AMBULATORY NON FORMULARY MEDICATION Oxygen at bedtime-2lpm      . amLODipine (NORVASC) 2.5 MG tablet Take 1 tablet (2.5 mg total) by mouth daily.  90 tablet  3  . aspirin 325 MG tablet Take 325 mg by mouth daily.        . Cetirizine HCl (ZYRTEC ALLERGY) 10 MG CAPS Take 1 capsule (10 mg total) by mouth daily.      . diazepam (VALIUM) 5 MG tablet take 1 tablet by mouth at bedtime if needed  30 tablet  3  . losartan (COZAAR) 100 MG tablet TAKE 1 TABLET EVERY DAY  90 tablet  0  . meloxicam (MOBIC) 15 MG tablet Take 15 mg by mouth daily.      . mometasone (NASONEX) 50 MCG/ACT nasal spray Place 2 sprays into the nose 2 (two) times daily.  41 g  4  . nitroGLYCERIN (NITROSTAT) 0.4 MG SL tablet Place 1 tablet (0.4 mg total) under the tongue every 5 (five) minutes as needed.  25 tablet  11  . omeprazole (PRILOSEC) 20 MG capsule Take 1 capsule by mouth Daily.      . polyethylene glycol powder (GLYCOLAX/MIRALAX) powder use 1/2 dose once daily  527 g  6  . tiotropium (SPIRIVA) 18 MCG inhalation capsule Place 1 capsule (18 mcg total) into inhaler and inhale daily.  30 capsule  11  . [DISCONTINUED] azithromycin (ZITHROMAX) 250 MG tablet Take as directed  6 each  0

## 2012-05-19 NOTE — Assessment & Plan Note (Signed)
Stable golds C Copd Plan No change in inhaled meds CXR is stable Pt is clear for planned OR on melanoma on chest

## 2012-05-20 ENCOUNTER — Telehealth: Payer: Self-pay | Admitting: Critical Care Medicine

## 2012-05-20 ENCOUNTER — Ambulatory Visit (INDEPENDENT_AMBULATORY_CARE_PROVIDER_SITE_OTHER): Payer: Medicare Other | Admitting: Family Medicine

## 2012-05-20 ENCOUNTER — Encounter: Payer: Self-pay | Admitting: Family Medicine

## 2012-05-20 VITALS — BP 110/72 | HR 100 | Temp 97.7°F | Wt 130.0 lb

## 2012-05-20 DIAGNOSIS — R5383 Other fatigue: Secondary | ICD-10-CM

## 2012-05-20 DIAGNOSIS — R5381 Other malaise: Secondary | ICD-10-CM

## 2012-05-20 LAB — CBC WITH DIFFERENTIAL/PLATELET
Basophils Absolute: 0 10*3/uL (ref 0.0–0.1)
Eosinophils Absolute: 0.2 10*3/uL (ref 0.0–0.7)
HCT: 39.8 % (ref 36.0–46.0)
Lymphs Abs: 2 10*3/uL (ref 0.7–4.0)
MCHC: 33.2 g/dL (ref 30.0–36.0)
Monocytes Absolute: 0.5 10*3/uL (ref 0.1–1.0)
Monocytes Relative: 7.3 % (ref 3.0–12.0)
Platelets: 210 10*3/uL (ref 150.0–400.0)
RDW: 14.3 % (ref 11.5–14.6)

## 2012-05-20 LAB — BASIC METABOLIC PANEL
BUN: 12 mg/dL (ref 6–23)
GFR: 72.2 mL/min (ref >60.00)
Glucose, Bld: 99 mg/dL (ref 70–99)
Potassium: 4.6 mEq/L (ref 3.5–5.1)

## 2012-05-20 NOTE — Progress Notes (Signed)
Chief Complaint  Patient presents with  . Discuss melanoa surgery    pt would like to have blood work done  . 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  Past Medical History  Diagnosis Date  . ANXIETY 08/16/2008  . CAD, UNSPECIFIED SITE 10/17/2008  . CHOLELITHIASIS 08/23/2008  . COPD 01/04/2010    FeV1 101%, DLCO64% 2008  . 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 DIVERTICULOSIS-COLON 04/07/2008  . GERD 04/19/2007  . HYPERSOMNIA, ASSOCIATED WITH SLEEP APNEA 02/01/2007  . HYPERTENSION 11/12/2006  . MITRAL VALVE PROLAPSE, HX OF 10/07/2008  . NEOPLASM, MALIGNANT, BREAST, RIGHT 02/15/2008  . OSTEOPOROSIS 04/04/2008  . Raynaud's syndrome 04/25/2009  . Atrial fibrillation   . Airway obstruction   . History of diverticulitis of colon   . Tortuous colon   . Anal stricture   . Chronic constipation   . Fatty liver   . Hiatal hernia   . Esophageal stricture   . IBS (irritable bowel syndrome)   . Pulmonary fibrosis   . Sleep apnea   . COPD (chronic obstructive pulmonary disease)   . Colitis     sigmoid  . Candida  esophagitis   . Diverticulitis     Family History  Problem Relation Age of Onset  . Parkinsonism Father   . Coronary artery disease Father   . Breast cancer      aunt  . Colon cancer      aunt, age 82  . Uterine cancer      aunt  . Stroke Mother   . Heart failure Mother   . Cancer Maternal Aunt     breast    History   Social History  . Marital Status: Widowed    Spouse Name: N/A    Number of Children: 3  . Years of Education: N/A   Occupational History  . retireed    Social History Main Topics  . Smoking status: Former Smoker -- 2.0 packs/day for 30 years    Types: Cigarettes    Quit date: 05/13/1983  . Smokeless tobacco: Never Used  . Alcohol Use: Yes     Comment: 1-2 glaases wine per day  . Drug Use: No  . Sexually Active: None   Other Topics Concern  . None   Social History Narrative   Living in assisted living    Current outpatient prescriptions:AMBULATORY NON FORMULARY MEDICATION, Oxygen at bedtime-2lpm, Disp: , Rfl: ;  amLODipine (NORVASC) 2.5 MG tablet, Take 1 tablet (2.5 mg total) by mouth daily., Disp: 90 tablet, Rfl: 3;  aspirin 325 MG tablet, Take 325 mg by  mouth daily.  , Disp: , Rfl: ;  Cetirizine HCl (ZYRTEC ALLERGY) 10 MG CAPS, Take 1 capsule (10 mg total) by mouth daily., Disp: , Rfl:  diazepam (VALIUM) 5 MG tablet, take 1 tablet by mouth at bedtime if needed, Disp: 30 tablet, Rfl: 3;  losartan (COZAAR) 100 MG tablet, TAKE 1 TABLET EVERY DAY, Disp: 90 tablet, Rfl: 0;  meloxicam (MOBIC) 15 MG tablet, Take 15 mg by mouth daily., Disp: , Rfl: ;  mometasone (NASONEX) 50 MCG/ACT nasal spray, Place 2 sprays into the nose 2 (two) times daily., Disp: 41 g, Rfl: 4 nitroGLYCERIN (NITROSTAT) 0.4 MG SL tablet, Place 1 tablet (0.4 mg total) under the tongue every 5 (five) minutes as needed., Disp: 25 tablet, Rfl: 11;  omeprazole (PRILOSEC) 20 MG capsule, Take 1 capsule by mouth Daily., Disp: , Rfl: ;  polyethylene glycol powder (GLYCOLAX/MIRALAX) powder, use 1/2  dose once daily, Disp: 527 g, Rfl: 6 tiotropium (SPIRIVA) 18 MCG inhalation capsule, Place 1 capsule (18 mcg total) into inhaler and inhale daily., Disp: 30 capsule, Rfl: 11  EXAM:  Filed Vitals:   05/20/12 1441  BP: 110/72  Pulse: 100  Temp: 97.7 F (36.5 C)    There is no height on file to calculate BMI.  GENERAL: vitals reviewed and listed above, alert, oriented, appears well hydrated and in no acute distress  HEENT: atraumatic, conjunttiva clear, no obvious abnormalities on inspection of external nose and ears  NECK: no obvious masses on inspection  LUNGS:  no wheezes, rales or rhonchi, good air movement  CV: HRRR, no peripheral edema  MS: moves all extremities without noticeable abnormality  PSYCH: pleasant and cooperative, no obvious depression or anxiety  ASSESSMENT AND PLAN:  Discussed the following assessment and plan:  1. Fatigue  Basic metabolic panel, CBC with Differential   -I am not very familiar with this pt - per review of chart has many medical problems that could cause fatigue. Had lab work in 10/2011 with PCP. Will check CBC and BMP today. Advised follow up with her PCP in a month. No cardiac symptoms- but advised to follow up with her cardiologist if continued symptoms or any chest pain.  -Patient advised to return or notify a doctor immediately if symptoms worsen or persist or new concerns arise.  There are no Patient Instructions on file for this visit.   Kriste Basque R.

## 2012-05-20 NOTE — Telephone Encounter (Signed)
Pt is aware of CXR results.

## 2012-05-21 ENCOUNTER — Telehealth: Payer: Self-pay | Admitting: Family Medicine

## 2012-05-21 NOTE — Telephone Encounter (Signed)
Please let her know her labs (blood counts, kidney function, electrolytes) look great!

## 2012-05-21 NOTE — Telephone Encounter (Signed)
Called and spoke with pt and pt is aware.  

## 2012-05-25 ENCOUNTER — Telehealth: Payer: Self-pay | Admitting: Critical Care Medicine

## 2012-05-25 NOTE — Telephone Encounter (Signed)
I spoke with the pt and she states she was told that Dr. Delford Field has not spoken with Dr. Ezzard Standing about her pulmonary status and she requests that this be done before her surgery on 05-28-12. I advised I will send a message to Dr. Delford Field to advise. Please advise.   Also the pt is having a lot of nasal drainage. She states it is clear drainage. She states she is using nasonex but wanted to know if there was anything OTC that she could use. I advised her to try chlorpheniramine 4mg  tablets at bedtime. Pt states understanding. Please advise if you have spoken with Dr. Ezzard Standing. Carron Curie, CMA

## 2012-05-25 NOTE — Telephone Encounter (Signed)
i sent a message to newman already so this is incorrect information

## 2012-05-25 NOTE — Telephone Encounter (Signed)
ATC x2 line rang several times and then went to busy signal. WCB. Carron Curie, CMA

## 2012-05-25 NOTE — Telephone Encounter (Signed)
LMTC x 1  

## 2012-05-26 NOTE — Telephone Encounter (Signed)
Pt advised. Jennifer Castillo, CMA  

## 2012-05-28 ENCOUNTER — Other Ambulatory Visit (INDEPENDENT_AMBULATORY_CARE_PROVIDER_SITE_OTHER): Payer: Self-pay | Admitting: Surgery

## 2012-05-28 DIAGNOSIS — C4359 Malignant melanoma of other part of trunk: Secondary | ICD-10-CM

## 2012-05-28 DIAGNOSIS — C4360 Malignant melanoma of unspecified upper limb, including shoulder: Secondary | ICD-10-CM | POA: Diagnosis not present

## 2012-06-01 ENCOUNTER — Telehealth (INDEPENDENT_AMBULATORY_CARE_PROVIDER_SITE_OTHER): Payer: Self-pay

## 2012-06-01 NOTE — Telephone Encounter (Signed)
P/O appt 06/09/12@ 1230( Voice mail)

## 2012-06-04 ENCOUNTER — Other Ambulatory Visit: Payer: Self-pay | Admitting: Internal Medicine

## 2012-06-09 ENCOUNTER — Ambulatory Visit (INDEPENDENT_AMBULATORY_CARE_PROVIDER_SITE_OTHER): Payer: Medicare Other | Admitting: Surgery

## 2012-06-09 ENCOUNTER — Encounter (INDEPENDENT_AMBULATORY_CARE_PROVIDER_SITE_OTHER): Payer: Self-pay | Admitting: Surgery

## 2012-06-09 VITALS — BP 118/70 | HR 82 | Temp 97.0°F | Resp 18 | Ht 62.0 in | Wt 133.6 lb

## 2012-06-09 DIAGNOSIS — C4359 Malignant melanoma of other part of trunk: Secondary | ICD-10-CM

## 2012-06-09 DIAGNOSIS — M1710 Unilateral primary osteoarthritis, unspecified knee: Secondary | ICD-10-CM | POA: Diagnosis not present

## 2012-06-09 NOTE — Progress Notes (Signed)
[facility]  Ovidio Kin, MD,  FACS [address] [place]  [address] [place], [place]    [address] Phone:  [phone] FAX:  [phone]   Re:   [name] [name] DOB:   1923-12-20 MRN:   [id number]  ASSESSMENT AND PLAN: 1.  . Right breast cancer. T1b, N0.   Right lumpectomy and Right SLNBx - 07/21/2007.  Disease free and doing well at 4 1/2 years from her surgery.  Though she has some pain in her right breast which is chronic.   Disease free.  I will see her back in 6 months.  2. Right subclavicular melanoma, T1a N0  Breslow's level - 0.5 mm  Wide excision showed some in situ melanoma, margins clear - 05/28/2012.  3. History of gall stones. Continue to observe.  4. On home O2 at night.  5. COPD.   Sees Dr. Jonni Sanger.  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 Per Dr. Lynelle Doctor note.   6. History of left thoracotomy where she still has pain issues.  HISTORY OF PRESENT ILLNESS: Chief Complaint  Patient presents with  . Routine Post Op    reck lesion excision    [name] [name] is a 79 y.o. (DOB: Jul 10, 1923)  white female who is a patient of Judie Petit, MD and comes to me today for follow up of a right subclavian biopsy which proved to be melanoma.  Wider excision did well.  The margins are clear.  Daughter with patient.  The incision hurts her.  Breast history: She had a right breast carcinoma which was 6 mm invasive ductal carcinoma. This was ER and PR receptor negative, Ki67 was 14%, and HER-2/neu negative. Her surgery was in July 21, 2007 and was a right breast lumpectomy and right axillary sentinel lymph node biopsy.  She has some lumpiness in the right breast.  Though to me, the breast is not much different from the left side, just sorer.    Social history: Daughter is with her.  Her granddaughter is getting married on December 28. Her daughter  works with the [employer] group and they have a concert  Saturday, 12/15, at [employer]. [employer]  PHYSICAL EXAM: BP 118/70  Pulse 82  Temp 97 F (36.1 C) (Temporal)  Resp 18  Ht 5\' 2"  (1.575 m)  Wt 133 lb 9.6 oz (60.601 kg)  BMI 24.44 kg/m2  Right subclavicular area - wound looks good.  Sutures removed.  DATA REVIEWED: Path to patient. I have a photo of her back.  Ovidio Kin, MD, FACS Office:  [phone]

## 2012-06-14 ENCOUNTER — Other Ambulatory Visit: Payer: Self-pay | Admitting: Internal Medicine

## 2012-06-16 DIAGNOSIS — M1710 Unilateral primary osteoarthritis, unspecified knee: Secondary | ICD-10-CM | POA: Diagnosis not present

## 2012-06-18 ENCOUNTER — Encounter: Payer: Self-pay | Admitting: Adult Health

## 2012-06-18 ENCOUNTER — Ambulatory Visit (INDEPENDENT_AMBULATORY_CARE_PROVIDER_SITE_OTHER): Payer: Medicare Other | Admitting: Adult Health

## 2012-06-18 VITALS — BP 112/74 | HR 73 | Temp 99.3°F | Ht 62.0 in | Wt 132.0 lb

## 2012-06-18 DIAGNOSIS — J438 Other emphysema: Secondary | ICD-10-CM | POA: Diagnosis not present

## 2012-06-18 DIAGNOSIS — J439 Emphysema, unspecified: Secondary | ICD-10-CM

## 2012-06-18 MED ORDER — LEVALBUTEROL HCL 0.63 MG/3ML IN NEBU
0.6300 mg | INHALATION_SOLUTION | Freq: Once | RESPIRATORY_TRACT | Status: AC
  Administered 2012-06-18: 0.63 mg via RESPIRATORY_TRACT

## 2012-06-18 MED ORDER — AZITHROMYCIN 250 MG PO TABS: ORAL_TABLET | ORAL | Status: AC

## 2012-06-18 NOTE — Progress Notes (Signed)
Subjective:    Patient ID: [name] [name], female    DOB: 13-May-1923, 79 y.o.   MRN: [id number]  HPI  This is a 79 y.o. 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 The pt also has hx of upper airway obstruction with difficult intubation with prior operative procedures. Also hx Breast CA s/p resection and XRT. Hx Gallstone disease with conservative Rx due to difficult intubation status.   05/19/2012 Pt dx melanoma R upper chest area.  Pt scheduled [facility].  Conscious sedation and local anesthesia.   Since last OV:  More dyspneic with exertion. Pt is hoarse.  Pt notes some pndrip. Pt had recent URI.  Dyspneic with exertion.  Pt feels weaker.  Legs not moving well.  06/18/2012 Acute OV  Complains of prod cough with "deep" yellow mucus, increased SOB, wheezing, tightness in chest x2days - denies f/c/s.   Has not been using her spiriva every day.  Patient denies any hemoptysis, orthopnea, PND, or leg swelling Mucinex is not helping. Her cough or congestion   Past Medical History  Diagnosis Date  . ANXIETY 08/16/2008  . CAD, UNSPECIFIED SITE 10/17/2008  . CHOLELITHIASIS 08/23/2008  . COPD 01/04/2010    FeV1 101%, DLCO64% 2008  . 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 DIVERTICULOSIS-COLON 04/07/2008  . GERD 04/19/2007  . HYPERSOMNIA, ASSOCIATED WITH SLEEP APNEA 02/01/2007  . HYPERTENSION 11/12/2006  . MITRAL VALVE PROLAPSE, HX OF 10/07/2008  . NEOPLASM, MALIGNANT, BREAST, RIGHT 02/15/2008  . OSTEOPOROSIS 04/04/2008  . Raynaud's syndrome 04/25/2009  . Atrial fibrillation   . Airway obstruction   . History of diverticulitis of colon   . Tortuous colon   . Anal stricture   . Chronic constipation   . Fatty liver   . Hiatal hernia   . Esophageal stricture   . IBS (irritable bowel syndrome)   . Pulmonary fibrosis   . Sleep apnea   . COPD (chronic obstructive pulmonary disease)   . Colitis     sigmoid  . Candida  esophagitis   . Diverticulitis      Family History  Problem Relation Age of Onset  . Parkinsonism Father   . Coronary artery disease Father   . Breast cancer      aunt  . Colon cancer      aunt, age 32  . Uterine cancer      aunt  . Stroke Mother   . Heart failure Mother   . Cancer Maternal Aunt     breast     History   Social History  . Marital Status: Widowed    Spouse Name: N/A    Number of Children: 3  . Years of Education: N/A   Occupational History  . retireed    Social History Main Topics  . Smoking status: Former Smoker -- 2.0 packs/day for 30 years    Types: Cigarettes    Quit date: 05/13/1983  . Smokeless tobacco: Never Used  . Alcohol Use: Yes     Comment: 1-2 glaases wine per day  . Drug Use: No  . Sexually Active: Not on file   Other Topics Concern  . Not on file   Social History Narrative   Living in assisted living     Allergies  Allergen Reactions  . Ciprofloxacin Swelling  . Penicillins     REACTION: hives  . Prednisone   . Sulfamethoxazole     REACTION: Nausea/vomiting  Outpatient Prescriptions Prior to Visit  Medication Sig Dispense Refill  . AMBULATORY NON FORMULARY MEDICATION Oxygen at bedtime-2lpm      . amLODipine (NORVASC) 2.5 MG tablet Take 1 tablet (2.5 mg total) by mouth daily.  90 tablet  3  . aspirin 325 MG tablet Take 325 mg by mouth daily.        . Cetirizine HCl (ZYRTEC ALLERGY) 10 MG CAPS Take 1 capsule (10 mg total) by mouth daily.      . diazepam (VALIUM) 5 MG tablet take 1 tablet by mouth at bedtime if needed  30 tablet  3  . losartan (COZAAR) 100 MG tablet TAKE 1 TABLET EVERY DAY  90 tablet  0  . meloxicam (MOBIC) 15 MG tablet Take 15 mg by mouth daily.      . mometasone (NASONEX) 50 MCG/ACT nasal spray Place 2 sprays into the nose 2 (two) times daily.  41 g  4  . nitroGLYCERIN (NITROSTAT) 0.4 MG SL tablet Place 1 tablet (0.4 mg total) under the tongue every 5 (five) minutes as needed.  25 tablet  11  .  omeprazole (PRILOSEC) 20 MG capsule Take 1 capsule by mouth Daily.      . polyethylene glycol powder (GLYCOLAX/MIRALAX) powder use 1/2 dose once daily  527 g  6  . tiotropium (SPIRIVA) 18 MCG inhalation capsule Place 1 capsule (18 mcg total) into inhaler and inhale daily.  30 capsule  11  Last reviewed on 06/18/2012  5:02 PM by Julio Sicks, NP   Review of Systems  Constitutional:   No  weight loss, night sweats,  Fevers, chills, fatigue, or  lassitude.  HEENT:   No headaches,  Difficulty swallowing,  Tooth/dental problems, or  Sore throat,             + sneezing, itching,  nasal congestion, post nasal drip,   CV:  No chest pain,  Orthopnea, PND, swelling in lower extremities, anasarca, dizziness, palpitations, syncope.   GI  No heartburn, indigestion, abdominal pain, nausea, vomiting, diarrhea, change in bowel habits, loss of appetite, bloody stools.   Resp:  ,  No coughing up of blood.     No wheezing.  No chest wall deformity  Skin: no rash or lesions.  GU: no dysuria, change in color of urine, no urgency or frequency.  No flank pain, no hematuria   MS:  No joint pain or swelling.  No decreased range of motion.  No back pain.  Psych:  No change in mood or affect. No depression or anxiety.  No memory loss.         Objective:   Physical Exam BP 112/74  Pulse 73  Temp 99.3 F (37.4 C) (Oral)  Ht 5\' 2"  (1.575 m)  Wt 132 lb (59.875 kg)  BMI 24.14 kg/m2  SpO2 95%  GEN: A/Ox3; pleasant , NAD, elderly  HEENT:  [place]/AT,  EACs-clear, TMs-wnl, NOSE-clear, THROAT-clear, no lesions, no postnasal drip or exudate noted.   NECK:  Supple w/ fair ROM; no JVD; normal carotid impulses w/o bruits; no thyromegaly or nodules palpated; no lymphadenopathy.  RESP  Coarse BS w/ no wheezingno accessory muscle use, no dullness to percussion  CARD:  RRR, no m/r/g  , no peripheral edema, pulses intact, no cyanosis or clubbing.  GI:   Soft & nt; nml bowel sounds; no organomegaly or masses  detected.  Musco: Warm bil, no deformities or joint swelling noted.   Neuro: alert, no focal deficits noted.  Skin: Warm, no lesions or rashes       No results found.   Assessment & Plan:   No problem-specific assessment & plan notes found for this encounter.   Updated Medication List Outpatient Encounter Prescriptions as of 06/18/2012  Medication Sig Dispense Refill  . AMBULATORY NON FORMULARY MEDICATION Oxygen at bedtime-2lpm      . amLODipine (NORVASC) 2.5 MG tablet Take 1 tablet (2.5 mg total) by mouth daily.  90 tablet  3  . aspirin 325 MG tablet Take 325 mg by mouth daily.        . Cetirizine HCl (ZYRTEC ALLERGY) 10 MG CAPS Take 1 capsule (10 mg total) by mouth daily.      . diazepam (VALIUM) 5 MG tablet take 1 tablet by mouth at bedtime if needed  30 tablet  3  . losartan (COZAAR) 100 MG tablet TAKE 1 TABLET EVERY DAY  90 tablet  0  . meloxicam (MOBIC) 15 MG tablet Take 15 mg by mouth daily.      . mometasone (NASONEX) 50 MCG/ACT nasal spray Place 2 sprays into the nose 2 (two) times daily.  41 g  4  . nitroGLYCERIN (NITROSTAT) 0.4 MG SL tablet Place 1 tablet (0.4 mg total) under the tongue every 5 (five) minutes as needed.  25 tablet  11  . omeprazole (PRILOSEC) 20 MG capsule Take 1 capsule by mouth Daily.      . polyethylene glycol powder (GLYCOLAX/MIRALAX) powder use 1/2 dose once daily  527 g  6  . tiotropium (SPIRIVA) 18 MCG inhalation capsule Place 1 capsule (18 mcg total) into inhaler and inhale daily.  30 capsule  11

## 2012-06-18 NOTE — Patient Instructions (Addendum)
Zpack take as directed  Mucinex DM Twice daily  As needed  Cough/congestion  Fluids and rest  Saline nasal rinses daily  follow up Dr. Delford Field  In 6 weeks  Please contact office for sooner follow up if symptoms do not improve or worsen or seek emergency care

## 2012-06-21 NOTE — Assessment & Plan Note (Signed)
Exacerbation  Plan Zpack take as directed  Mucinex DM Twice daily  As needed  Cough/congestion  Fluids and rest  Saline nasal rinses daily  follow up Dr. Delford Field  In 6 weeks  Please contact office for sooner follow up if symptoms do not improve or worsen or seek emergency care

## 2012-06-22 ENCOUNTER — Telehealth: Payer: Self-pay | Admitting: Critical Care Medicine

## 2012-06-22 MED ORDER — DOXYCYCLINE HYCLATE 100 MG PO TABS: 100.0000 mg | ORAL_TABLET | Freq: Two times a day (BID) | ORAL | Status: DC

## 2012-06-22 NOTE — Telephone Encounter (Signed)
Spoke with pt She states that she is not much better since 2/7 when she was seen by TP She is still having prod cough with moderate yellow sputum, although this is some better since she finished zpack No CP, increased SOB, f/c/s- "just don't feel well yet" I offered ov and she refused at this time PW, please advise thanks! Allergies  Allergen Reactions  . Ciprofloxacin Swelling  . Penicillins     REACTION: hives  . Prednisone   . Sulfamethoxazole     REACTION: Nausea/vomiting

## 2012-06-22 NOTE — Telephone Encounter (Signed)
Call in doxycycline 100mg  bid x 7days

## 2012-06-22 NOTE — Telephone Encounter (Signed)
I spoke with pt and is aware of PW recs. I have called in RX into the pharmacy. Nothing further was needed

## 2012-07-03 ENCOUNTER — Other Ambulatory Visit: Payer: Self-pay | Admitting: Internal Medicine

## 2012-07-18 NOTE — Assessment & Plan Note (Signed)
She does remarkably well She has intermittent f/u with pulmonary

## 2012-07-18 NOTE — Progress Notes (Signed)
Reviewed recent hx. Note melanoma with clear margins. Reviewed dr Allene Pyo note.   She has known COPD-- does quite well  Hx breast CA-- no known recurrence  GERD, no sxs  Reviewed meds, shx, pmhx  ROS: she complains of knee pain- has known OA and not a candidate for knee replacement She has watery eyes and runny nose-- uses nasonex Bladder- she complains of frequent urinary incontinence   Well-developed well-nourished female in no acute distress. HEENT exam atraumatic, normocephalic, extraocular muscles are intact. Neck is supple. No jugular venous distention no thyromegaly. Chest clear to auscultation without increased work of breathing. Cardiac exam S1 and S2 are regular. Abdominal exam active bowel sounds, soft, nontender. Extremities no edema. Neurologic exam she is alert without any motor sensory deficits. Gait is normal.

## 2012-07-18 NOTE — Assessment & Plan Note (Signed)
No sxs.  No need for further eval

## 2012-07-19 ENCOUNTER — Encounter: Payer: Self-pay | Admitting: Internal Medicine

## 2012-07-19 ENCOUNTER — Ambulatory Visit (INDEPENDENT_AMBULATORY_CARE_PROVIDER_SITE_OTHER): Payer: Medicare Other | Admitting: Internal Medicine

## 2012-07-19 VITALS — BP 134/84 | Temp 98.4°F | Wt 132.0 lb

## 2012-07-19 DIAGNOSIS — J439 Emphysema, unspecified: Secondary | ICD-10-CM

## 2012-07-19 DIAGNOSIS — H9193 Unspecified hearing loss, bilateral: Secondary | ICD-10-CM

## 2012-07-19 DIAGNOSIS — A310 Pulmonary mycobacterial infection: Secondary | ICD-10-CM | POA: Diagnosis not present

## 2012-07-19 DIAGNOSIS — R32 Unspecified urinary incontinence: Secondary | ICD-10-CM

## 2012-07-19 DIAGNOSIS — J438 Other emphysema: Secondary | ICD-10-CM

## 2012-07-19 DIAGNOSIS — C4359 Malignant melanoma of other part of trunk: Secondary | ICD-10-CM

## 2012-07-19 DIAGNOSIS — I10 Essential (primary) hypertension: Secondary | ICD-10-CM | POA: Diagnosis not present

## 2012-07-19 DIAGNOSIS — H9190 Unspecified hearing loss, unspecified ear: Secondary | ICD-10-CM

## 2012-07-19 LAB — POCT URINALYSIS DIPSTICK
Leukocytes, UA: NEGATIVE
Nitrite, UA: NEGATIVE
Protein, UA: NEGATIVE
Urobilinogen, UA: 0.2
pH, UA: 6

## 2012-07-19 NOTE — Assessment & Plan Note (Signed)
Well controlled Continue meds 

## 2012-07-19 NOTE — Assessment & Plan Note (Signed)
She uses hearing aid

## 2012-07-19 NOTE — Patient Instructions (Signed)
Call Dr. Terri Piedra for dermatology appointment

## 2012-07-22 ENCOUNTER — Other Ambulatory Visit: Payer: Self-pay | Admitting: *Deleted

## 2012-07-22 MED ORDER — CIPROFLOXACIN HCL 250 MG PO TABS: 250.0000 mg | ORAL_TABLET | Freq: Two times a day (BID) | ORAL | Status: DC

## 2012-07-26 ENCOUNTER — Encounter: Payer: Self-pay | Admitting: Internal Medicine

## 2012-08-04 DIAGNOSIS — N3946 Mixed incontinence: Secondary | ICD-10-CM | POA: Diagnosis not present

## 2012-08-11 DIAGNOSIS — M25519 Pain in unspecified shoulder: Secondary | ICD-10-CM | POA: Diagnosis not present

## 2012-09-04 ENCOUNTER — Other Ambulatory Visit: Payer: Self-pay | Admitting: Internal Medicine

## 2012-09-09 ENCOUNTER — Telehealth: Payer: Self-pay | Admitting: Internal Medicine

## 2012-09-09 NOTE — Telephone Encounter (Signed)
Patient Information:  Caller Name: [name] [name]  Phone: [phone]  Patient: [name] [name], [name] [name]  Gender: Female  DOB: [birth year]  Age: 79 Years  PCP: Birdie Sons (Adults only)  Office Follow Up:  Does the office need to follow up with this patient?: Yes  Instructions For The Office: Pt requesting RX for UTI sx. Thank you.  RN note:  Caller declined appt. Instructed caller would forward for review.  Caller is overwhelmed d/t death in the family.  Symptoms  Reason For Call & Symptoms: Pt retrurning call from office.  RN unable to verify. 1.  Pt having dysuria and requesting a Rx to treat.  Pt unable to come in office this week d/t death in family.  2.  Caller wants to inform [name] [name] [name] [name]) [name] [name],  her son in law passed away on 2012/09/14 He is a pt of Dr. Cato [name].  Pt died of a heart attack while out on a run. 3. Pt needs a Rx refill for Miralax sent to [facility] located @ [address].   Reviewed Health History In EMR: Yes  Reviewed Medications In EMR: Yes  Reviewed Allergies In EMR: Yes  Reviewed Surgeries / Procedures: Yes  Date of Onset of Symptoms: 09/08/2012  Guideline(s) Used:  Urination Pain - Female  Disposition Per Guideline:   See Today in Office  Reason For Disposition Reached:   > 2 UTIs in last year  Advice Given:  Fluids:   Drink extra fluids. Drink 8-10 glasses of liquids a day (Reason: to produce a dilute, non-irritating urine).  Call back : If you develop fever Symptoms worsen  Patient Refused Recommendation:  Patient Requests Prescription

## 2012-09-10 NOTE — Telephone Encounter (Signed)
Pt is either going to have to come here or go to Urgent Care.  Dr Cato Mulligan is out of the office today

## 2012-09-10 NOTE — Telephone Encounter (Signed)
Left message for patient to call to schedule. 

## 2012-09-11 ENCOUNTER — Encounter: Payer: Self-pay | Admitting: Family Medicine

## 2012-09-11 ENCOUNTER — Ambulatory Visit (INDEPENDENT_AMBULATORY_CARE_PROVIDER_SITE_OTHER): Payer: Medicare Other | Admitting: Family Medicine

## 2012-09-11 VITALS — BP 120/72 | Temp 97.9°F | Wt 133.0 lb

## 2012-09-11 DIAGNOSIS — N390 Urinary tract infection, site not specified: Secondary | ICD-10-CM | POA: Diagnosis not present

## 2012-09-11 DIAGNOSIS — R300 Dysuria: Secondary | ICD-10-CM | POA: Diagnosis not present

## 2012-09-11 LAB — POCT URINALYSIS DIPSTICK
Bilirubin, UA: NEGATIVE
Blood, UA: NEGATIVE
Glucose, UA: NEGATIVE
Spec Grav, UA: 1.01

## 2012-09-11 NOTE — Progress Notes (Signed)
  Subjective:    Patient ID: Guy Franco, female    DOB: November 08, 1923, 79 y.o.   MRN: [id number]  HPI Tamyka is a 79 year old female who comes in today for evaluation of a urinary tract infection  She states 5 days ago she began having dysuria. At that time she began left over Cipro 1 twice a day. She took 6 doses and after one day her symptoms abated. Her last dose was Thursday. She's asymptomatic but she came in today anyway just to get her urine checked   Review of Systems Review of systems negative    Objective:   Physical Exam  Well-developed and nourished female no acute distress abdominal exam negative specifically bowel sounds normal there is no tenderness scars from previous surgery no palpable masses  Urinalysis normal      Assessment & Plan:  Urinary tract infection resolve with increased fluid intake, cranberry juice, and Cipro x6 tabs

## 2012-09-11 NOTE — Patient Instructions (Addendum)
Drink 32 ounces of water daily  Cranberry juice,,,,,,,,,, 4 ounces twice daily for 1 more week  Return when necessary

## 2012-09-17 ENCOUNTER — Telehealth: Payer: Self-pay | Admitting: Critical Care Medicine

## 2012-09-17 DIAGNOSIS — J302 Other seasonal allergic rhinitis: Secondary | ICD-10-CM

## 2012-09-17 MED ORDER — MOMETASONE FUROATE 50 MCG/ACT NA SUSP: 2.0000 | Freq: Two times a day (BID) | NASAL | Status: DC

## 2012-09-17 NOTE — Telephone Encounter (Signed)
Spoke with patient, Patient requesting Rx refill on Nasonex Rx sent to verified pharmacy and nothing further needed at this time

## 2012-09-21 ENCOUNTER — Ambulatory Visit (INDEPENDENT_AMBULATORY_CARE_PROVIDER_SITE_OTHER): Payer: Medicare Other | Admitting: Family Medicine

## 2012-09-21 ENCOUNTER — Encounter: Payer: Self-pay | Admitting: Family Medicine

## 2012-09-21 VITALS — BP 120/70 | Temp 98.1°F | Wt 137.0 lb

## 2012-09-21 DIAGNOSIS — M7989 Other specified soft tissue disorders: Secondary | ICD-10-CM

## 2012-09-21 NOTE — Progress Notes (Signed)
Chief Complaint  Patient presents with  . Foot Swelling    by late afternoon swollen     HPI:  Acute visit for bilat feet swelling: -could not get in to see PCP -noticed last week in the afternoon after a big meal of crab cakes, has now resolved -has been eating more out and at retirement home then in the past, also has been less mobile due to OA of knees being tx by ortho -MMP per [facility] followed by pulm for lung disease and cards for heart disease, circulatory issues - appears missed 1 year follow up with cards - but she will schedule this appointment -on CCB, not on diuretic -denies: CP, increase SOB or orthopnea or DOE, palpitations, swelling since, trauma, fall -has gained weight since moving to retirement community because eating a lot more  -ROS: See pertinent positives and negatives per HPI.  Past Medical History  Diagnosis Date  . ANXIETY 08/16/2008  . CAD, UNSPECIFIED SITE 10/17/2008  . CHOLELITHIASIS 08/23/2008  . COPD 01/04/2010    FeV1 101%, DLCO64% 2008  . 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 DIVERTICULOSIS-COLON 04/07/2008  . GERD 04/19/2007  . HYPERSOMNIA, ASSOCIATED WITH SLEEP APNEA 02/01/2007  . HYPERTENSION 11/12/2006  . MITRAL VALVE PROLAPSE, HX OF 10/07/2008  . NEOPLASM, MALIGNANT, BREAST, RIGHT 02/15/2008  . OSTEOPOROSIS 04/04/2008  . Raynaud's syndrome 04/25/2009  . Atrial fibrillation   . Airway obstruction   . History of diverticulitis of colon   . Tortuous colon   . Anal stricture   . Chronic constipation   . Fatty liver   . Hiatal hernia   . Esophageal stricture   . IBS (irritable bowel syndrome)   . Pulmonary fibrosis   . Sleep apnea   . COPD (chronic obstructive pulmonary disease)   . Colitis     sigmoid  . Candida esophagitis   . Diverticulitis     Family History  Problem Relation Age of Onset  . Parkinsonism Father   . Coronary artery disease Father   . Breast cancer      aunt  . Colon cancer      aunt, age 5  . Uterine cancer       aunt  . Stroke Mother   . Heart failure Mother   . Cancer Maternal Aunt     breast    History   Social History  . Marital Status: Widowed    Spouse Name: N/A    Number of Children: 3  . Years of Education: N/A   Occupational History  . retireed    Social History Main Topics  . Smoking status: Former Smoker -- 2.00 packs/day for 30 years    Types: Cigarettes    Quit date: 05/13/1983  . Smokeless tobacco: Never Used  . Alcohol Use: Yes     Comment: 1-2 glaases wine per day  . Drug Use: No  . Sexually Active: None   Other Topics Concern  . None   Social History Narrative   Living in assisted living    Current outpatient prescriptions:AMBULATORY NON FORMULARY MEDICATION, Oxygen at bedtime-2lpm, Disp: , Rfl: ;  amLODipine (NORVASC) 2.5 MG tablet, Take 1 tablet (2.5 mg total) by mouth daily., Disp: 90 tablet, Rfl: 3;  aspirin 325 MG tablet, Take 325 mg by mouth daily.  , Disp: , Rfl: ;  Cetirizine HCl (ZYRTEC ALLERGY) 10 MG CAPS, Take 1 capsule (10 mg total) by mouth daily., Disp: , Rfl:  diazepam (VALIUM) 5 MG tablet, take  1 tablet by mouth at bedtime if needed, Disp: 30 tablet, Rfl: 3;  losartan (COZAAR) 100 MG tablet, TAKE 1 TABLET BY MOUTH ONCE DAILY, Disp: 90 tablet, Rfl: 0;  meloxicam (MOBIC) 15 MG tablet, Take 15 mg by mouth daily., Disp: , Rfl: ;  mometasone (NASONEX) 50 MCG/ACT nasal spray, Place 2 sprays into the nose 2 (two) times daily., Disp: 41 g, Rfl: 4 nitroGLYCERIN (NITROSTAT) 0.4 MG SL tablet, Place 1 tablet (0.4 mg total) under the tongue every 5 (five) minutes as needed., Disp: 25 tablet, Rfl: 11;  omeprazole (PRILOSEC) 20 MG capsule, TAKE ONE CAPSULE BY MOUTH EVERY DAY, Disp: 90 capsule, Rfl: 3;  polyethylene glycol powder (GLYCOLAX/MIRALAX) powder, use 1/2 dose once daily, Disp: 527 g, Rfl: 6 tiotropium (SPIRIVA) 18 MCG inhalation capsule, Place 1 capsule (18 mcg total) into inhaler and inhale daily., Disp: 30 capsule, Rfl: 11;  ciprofloxacin (CIPRO) 250 MG  tablet, Take 1 tablet (250 mg total) by mouth 2 (two) times daily., Disp: 10 tablet, Rfl: 0  EXAM:  Filed Vitals:   09/21/12 1342  BP: 120/70  Temp: 98.1 F (36.7 C)    Body mass index is 25.05 kg/(m^2).  GENERAL: vitals reviewed and listed above, alert, oriented, appears well hydrated and in no acute distress  HEENT: atraumatic, conjunttiva clear, no obvious abnormalities on inspection of external nose and ears  NECK: no obvious masses on inspection  LUNGS: clear to auscultation bilaterally, no wheezes, rales or rhonchi, good air movement  CV: HRRR, no peripheral edema  MS: moves all extremities without noticeable abnormality  PSYCH: pleasant and cooperative, no obvious depression or anxiety  ASSESSMENT AND PLAN:  Discussed the following assessment and plan:  Bilateral swelling of feet  -no swelling on exam, no other symptoms, she will follow up with her cardiologist- but suspect this was related to less mobility due to knee pain and high sodium meal. She will monitor and let her doctor now if returns or other concerns. -Patient advised to return or notify a doctor immediately if symptoms worsen or persist or new concerns arise.  There are no Patient Instructions on file for this visit.   Kriste Basque R.

## 2012-10-08 ENCOUNTER — Encounter (INDEPENDENT_AMBULATORY_CARE_PROVIDER_SITE_OTHER): Payer: Self-pay | Admitting: Surgery

## 2012-10-09 ENCOUNTER — Other Ambulatory Visit: Payer: Self-pay | Admitting: Internal Medicine

## 2012-10-11 ENCOUNTER — Encounter: Payer: Self-pay | Admitting: Critical Care Medicine

## 2012-10-11 ENCOUNTER — Ambulatory Visit (INDEPENDENT_AMBULATORY_CARE_PROVIDER_SITE_OTHER): Payer: Medicare Other | Admitting: Critical Care Medicine

## 2012-10-11 VITALS — BP 118/70 | HR 81 | Temp 97.6°F | Ht 61.25 in | Wt 136.0 lb

## 2012-10-11 DIAGNOSIS — J439 Emphysema, unspecified: Secondary | ICD-10-CM

## 2012-10-11 DIAGNOSIS — J438 Other emphysema: Secondary | ICD-10-CM

## 2012-10-11 NOTE — Patient Instructions (Addendum)
Stop Spiriva Return 4 months, call sooner if your breathing declines

## 2012-10-11 NOTE — Assessment & Plan Note (Signed)
Golds A. COPD with nonadherence to Spiriva Note current pulmonary function studies improved with FEV1 and greater than 80% of predicted Previously the patient had a low diffusion capacity at 64% predicted Plan Maintain off Spiriva at this time The patient is to notify us if her pulmonary function decline

## 2012-10-11 NOTE — Progress Notes (Signed)
Subjective:    Patient ID: [name] [name], female    DOB: [birth year], 79 y.o.   MRN: [id number]  HPI  This is a 79 y.o. 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 The pt also has hx of upper airway obstruction with difficult intubation with prior operative procedures. Also hx Breast CA s/p resection and XRT. Hx Gallstone disease with conservative Rx due to difficult intubation status.   10/11/2012 Pt with freq UTIs. Sees PCP.  Allergies bad this year. Now not much cough. Mucus does drain from the head. No real wheeze.  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 Notes some pndrip  No mucus from lungs    Past Medical History  Diagnosis Date  . ANXIETY 08/16/2008  . CAD, UNSPECIFIED SITE 10/17/2008  . CHOLELITHIASIS 08/23/2008  . COPD 01/04/2010    FeV1 101%, DLCO64% 2008  . 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 DIVERTICULOSIS-COLON 04/07/2008  . GERD 04/19/2007  . HYPERSOMNIA, ASSOCIATED WITH SLEEP APNEA 02/01/2007  . HYPERTENSION 11/12/2006  . MITRAL VALVE PROLAPSE, HX OF 10/07/2008  . NEOPLASM, MALIGNANT, BREAST, RIGHT 02/15/2008  . OSTEOPOROSIS 04/04/2008  . Raynaud's syndrome 04/25/2009  . Atrial fibrillation   . Airway obstruction   . History of diverticulitis of colon   . Tortuous colon   . Anal stricture   . Chronic constipation   . Fatty liver   . Hiatal hernia   . Esophageal stricture   . IBS (irritable bowel syndrome)   . Pulmonary fibrosis   . Sleep apnea   . COPD (chronic obstructive pulmonary disease)   . Colitis     sigmoid  . Candida esophagitis   . Diverticulitis      Family History  Problem Relation Age of Onset  . Parkinsonism Father   . Coronary artery disease Father   . Breast cancer      aunt  . Colon cancer      aunt, age 26  . Uterine cancer      aunt  . Stroke Mother   . Heart failure Mother   . Cancer Maternal Aunt     breast     History   Social  History  . Marital Status: Widowed    Spouse Name: N/A    Number of Children: 3  . Years of Education: N/A   Occupational History  . retireed    Social History Main Topics  . Smoking status: Former Smoker -- 2.00 packs/day for 30 years    Types: Cigarettes    Quit date: 05/13/1983  . Smokeless tobacco: Never Used  . Alcohol Use: Yes     Comment: 1-2 glaases wine per day  . Drug Use: No  . Sexually Active: Not on file   Other Topics Concern  . Not on file   Social History Narrative   Living in assisted living     Allergies  Allergen Reactions  . Ciprofloxacin Swelling  . Penicillins     REACTION: hives  . Prednisone   . Sulfamethoxazole     REACTION: Nausea/vomiting     Outpatient Prescriptions Prior to Visit  Medication Sig Dispense Refill  . AMBULATORY NON FORMULARY MEDICATION Oxygen at bedtime-2lpm      . amLODipine (NORVASC) 2.5 MG tablet Take 1 tablet (2.5 mg total) by mouth daily.  90 tablet  3  . aspirin 325 MG tablet Take 325 mg by mouth daily.        Marland Kitchen  Cetirizine HCl (ZYRTEC ALLERGY) 10 MG CAPS Take 1 capsule (10 mg total) by mouth daily.      . diazepam (VALIUM) 5 MG tablet take 1 tablet by mouth at bedtime if needed  30 tablet  3  . losartan (COZAAR) 100 MG tablet TAKE 1 TABLET BY MOUTH ONCE DAILY  90 tablet  0  . mometasone (NASONEX) 50 MCG/ACT nasal spray Place 2 sprays into the nose 2 (two) times daily.  41 g  4  . nitroGLYCERIN (NITROSTAT) 0.4 MG SL tablet Place 1 tablet (0.4 mg total) under the tongue every 5 (five) minutes as needed.  25 tablet  11  . polyethylene glycol powder (GLYCOLAX/MIRALAX) powder use 1/2 dose once daily  527 g  6  . tiotropium (SPIRIVA) 18 MCG inhalation capsule Place 1 capsule (18 mcg total) into inhaler and inhale daily.  30 capsule  11  . omeprazole (PRILOSEC) 20 MG capsule TAKE ONE CAPSULE BY MOUTH EVERY DAY  90 capsule  3  . ciprofloxacin (CIPRO) 250 MG tablet Take 1 tablet (250 mg total) by mouth 2 (two) times daily.  10  tablet  0  . meloxicam (MOBIC) 15 MG tablet Take 15 mg by mouth daily.       No facility-administered medications prior to visit.     Review of Systems  Constitutional:   No  weight loss, night sweats,  Fevers, chills, fatigue, or  lassitude.  HEENT:   No headaches,  Difficulty swallowing,  Tooth/dental problems, or  Sore throat,             + sneezing, itching,  nasal congestion, post nasal drip,   CV:  No chest pain,  Orthopnea, PND, swelling in lower extremities, anasarca, dizziness, palpitations, syncope.   GI  No heartburn, indigestion, abdominal pain, nausea, vomiting, diarrhea, change in bowel habits, loss of appetite, bloody stools.   Resp:  ,  No coughing up of blood.     No wheezing.  No chest wall deformity  Skin: no rash or lesions.  GU: no dysuria, change in color of urine, no urgency or frequency.  No flank pain, no hematuria   MS:  No joint pain or swelling.  No decreased range of motion.  No back pain.  Psych:  No change in mood or affect. No depression or anxiety.  No memory loss.         Objective:   Physical Exam BP 118/70  Pulse 81  Temp(Src) 97.6 F (36.4 C) (Oral)  Ht 5' 1.25" (1.556 m)  Wt 136 lb (61.689 kg)  BMI 25.48 kg/m2  SpO2 97%  GEN: A/Ox3; pleasant , NAD, elderly  HEENT:  [place]/AT,  EACs-clear, TMs-wnl, NOSE-clear, THROAT-clear, no lesions, no postnasal drip or exudate noted.   NECK:  Supple w/ fair ROM; no JVD; normal carotid impulses w/o bruits; no thyromegaly or nodules palpated; no lymphadenopathy.  RESP  Coarse BS w/ no wheezingno accessory muscle use, no dullness to percussion  CARD:  RRR, no m/r/g  , no peripheral edema, pulses intact, no cyanosis or clubbing.  GI:   Soft & nt; nml bowel sounds; no organomegaly or masses detected.  Musco: Warm bil, no deformities or joint swelling noted.   Neuro: alert, no focal deficits noted.    Skin: Warm, no lesions or rashes  Spirometry obtained 10/11/12: FEV1 greater than 80%  predicted      No results found.   Assessment & Plan:   COPD with emphysema, gold stage A Golds  A. COPD with nonadherence to Spiriva Note current pulmonary function studies improved with FEV1 and greater than 80% of predicted Previously the patient had a low diffusion capacity at 64% predicted Plan Maintain off Spiriva at this time The patient is to notify us if her pulmonary function decline    Updated Medication List Outpatient Encounter Prescriptions as of 10/11/2012  Medication Sig Dispense Refill  . AMBULATORY NON FORMULARY MEDICATION Oxygen at bedtime-2lpm      . amLODipine (NORVASC) 2.5 MG tablet Take 1 tablet (2.5 mg total) by mouth daily.  90 tablet  3  . aspirin 325 MG tablet Take 325 mg by mouth daily.        . Cetirizine HCl (ZYRTEC ALLERGY) 10 MG CAPS Take 1 capsule (10 mg total) by mouth daily.      . diazepam (VALIUM) 5 MG tablet take 1 tablet by mouth at bedtime if needed  30 tablet  3  . losartan (COZAAR) 100 MG tablet TAKE 1 TABLET BY MOUTH ONCE DAILY  90 tablet  0  . mometasone (NASONEX) 50 MCG/ACT nasal spray Place 2 sprays into the nose 2 (two) times daily.  41 g  4  . nitroGLYCERIN (NITROSTAT) 0.4 MG SL tablet Place 1 tablet (0.4 mg total) under the tongue every 5 (five) minutes as needed.  25 tablet  11  . polyethylene glycol powder (GLYCOLAX/MIRALAX) powder use 1/2 dose once daily  527 g  6  . [DISCONTINUED] tiotropium (SPIRIVA) 18 MCG inhalation capsule Place 1 capsule (18 mcg total) into inhaler and inhale daily.  30 capsule  11  . omeprazole (PRILOSEC) 20 MG capsule TAKE ONE CAPSULE BY MOUTH EVERY DAY  90 capsule  3  . [DISCONTINUED] ciprofloxacin (CIPRO) 250 MG tablet Take 1 tablet (250 mg total) by mouth 2 (two) times daily.  10 tablet  0  . [DISCONTINUED] meloxicam (MOBIC) 15 MG tablet Take 15 mg by mouth daily.       No facility-administered encounter medications on file as of 10/11/2012.

## 2012-10-20 DIAGNOSIS — Z85820 Personal history of malignant melanoma of skin: Secondary | ICD-10-CM | POA: Diagnosis not present

## 2012-10-20 DIAGNOSIS — D485 Neoplasm of uncertain behavior of skin: Secondary | ICD-10-CM | POA: Diagnosis not present

## 2012-10-20 DIAGNOSIS — D235 Other benign neoplasm of skin of trunk: Secondary | ICD-10-CM | POA: Diagnosis not present

## 2012-10-20 DIAGNOSIS — D1801 Hemangioma of skin and subcutaneous tissue: Secondary | ICD-10-CM | POA: Diagnosis not present

## 2012-10-25 DIAGNOSIS — M1710 Unilateral primary osteoarthritis, unspecified knee: Secondary | ICD-10-CM | POA: Diagnosis not present

## 2012-10-28 ENCOUNTER — Telehealth: Payer: Self-pay | Admitting: Cardiology

## 2012-10-28 NOTE — Telephone Encounter (Signed)
New Prob    Pt would like to speak to nurse regarding her being transitioned to a new provider. Her name was not on the referral spreadsheet that was sent to all schedulers. I offered pt first available and she declined. Please call.

## 2012-10-28 NOTE — Telephone Encounter (Signed)
I spoke with pt and have scheduled her to see Dr. Daleen Squibb on 11/18/12. Mylo Red RN

## 2012-11-04 DIAGNOSIS — M1710 Unilateral primary osteoarthritis, unspecified knee: Secondary | ICD-10-CM | POA: Diagnosis not present

## 2012-11-11 ENCOUNTER — Other Ambulatory Visit: Payer: Self-pay | Admitting: Cardiology

## 2012-11-14 ENCOUNTER — Other Ambulatory Visit: Payer: Self-pay | Admitting: Critical Care Medicine

## 2012-11-15 DIAGNOSIS — M1710 Unilateral primary osteoarthritis, unspecified knee: Secondary | ICD-10-CM | POA: Diagnosis not present

## 2012-11-18 ENCOUNTER — Ambulatory Visit (INDEPENDENT_AMBULATORY_CARE_PROVIDER_SITE_OTHER): Payer: Medicare Other | Admitting: Emergency Medicine

## 2012-11-18 ENCOUNTER — Encounter: Payer: Self-pay | Admitting: Cardiology

## 2012-11-18 ENCOUNTER — Encounter: Payer: Self-pay | Admitting: Emergency Medicine

## 2012-11-18 ENCOUNTER — Ambulatory Visit (INDEPENDENT_AMBULATORY_CARE_PROVIDER_SITE_OTHER): Payer: Medicare Other | Admitting: Cardiology

## 2012-11-18 VITALS — BP 124/80 | HR 86 | Ht 61.25 in | Wt 128.0 lb

## 2012-11-18 VITALS — BP 118/78 | HR 85 | Temp 98.6°F | Ht 62.0 in | Wt 131.0 lb

## 2012-11-18 DIAGNOSIS — I2510 Atherosclerotic heart disease of native coronary artery without angina pectoris: Secondary | ICD-10-CM

## 2012-11-18 DIAGNOSIS — I10 Essential (primary) hypertension: Secondary | ICD-10-CM | POA: Diagnosis not present

## 2012-11-18 DIAGNOSIS — J069 Acute upper respiratory infection, unspecified: Secondary | ICD-10-CM | POA: Diagnosis not present

## 2012-11-18 MED ORDER — AEROCHAMBER MV MISC: Status: DC

## 2012-11-18 MED ORDER — ASPIRIN 81 MG PO TABS: 325.0000 mg | ORAL_TABLET | Freq: Every day | ORAL | Status: DC

## 2012-11-18 MED ORDER — LOSARTAN POTASSIUM 50 MG PO TABS: 50.0000 mg | ORAL_TABLET | Freq: Every day | ORAL | Status: DC

## 2012-11-18 NOTE — Patient Instructions (Addendum)
Please use your zyrtec and nasonex as directed Start decongestant containing chlorpheniramine or brompheniramine as directed (OTC) Use albuterol 2 puffs through a spacer as needed for shortness of breath or chest tightness Call our office for any changes in your symptoms, especially an increase or change in color of your mucous, or for more shortness of breath Follow with Dr Delford Field in 1 month

## 2012-11-18 NOTE — Progress Notes (Signed)
Subjective:    Patient ID: [name] [name], female    DOB: [birth year], 79 y.o.   MRN: [id number]  HPI This is a 79 y.o. 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 The pt also has hx of upper airway obstruction with difficult intubation with prior operative procedures. Also hx Breast CA s/p resection and XRT. Hx Gallstone disease with conservative Rx due to difficult intubation status.   10/11/2012 Pt with freq UTIs. Sees PCP.  Allergies bad this year. Now not much cough. Mucus does drain from the head. No real wheeze.  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 Notes some pndrip  No mucus from lungs  Acute OV 11/18/12 -- hx COPD, MAIC, UA irritation followed by Dr Timoteo Ace.  She has been well until 2 -3 days ago. Some leg weakness, fatigue. Lots of nasal congestion. Throat irritation. Probable fever last night. She ran out of nasonex a couple weeks ago.  Has taken ASA. Is on zyrtec qd.    Past Medical History  Diagnosis Date  . ANXIETY 08/16/2008  . CAD, UNSPECIFIED SITE 10/17/2008  . CHOLELITHIASIS 08/23/2008  . COPD 01/04/2010    FeV1 101%, DLCO64% 2008  . 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 DIVERTICULOSIS-COLON 04/07/2008  . GERD 04/19/2007  . HYPERSOMNIA, ASSOCIATED WITH SLEEP APNEA 02/01/2007  . HYPERTENSION 11/12/2006  . MITRAL VALVE PROLAPSE, HX OF 10/07/2008  . NEOPLASM, MALIGNANT, BREAST, RIGHT 02/15/2008  . OSTEOPOROSIS 04/04/2008  . Raynaud's syndrome 04/25/2009  . Atrial fibrillation   . Airway obstruction   . History of diverticulitis of colon   . Tortuous colon   . Anal stricture   . Chronic constipation   . Fatty liver   . Hiatal hernia   . Esophageal stricture   . IBS (irritable bowel syndrome)   . Pulmonary fibrosis   . Sleep apnea   . COPD (chronic obstructive pulmonary disease)   . Colitis     sigmoid  . Candida esophagitis   . Diverticulitis      Family History  Problem  Relation Age of Onset  . Parkinsonism Father   . Coronary artery disease Father   . Breast cancer      aunt  . Colon cancer      aunt, age 59  . Uterine cancer      aunt  . Stroke Mother   . Heart failure Mother   . Cancer Maternal Aunt     breast     History   Social History  . Marital Status: Widowed    Spouse Name: N/A    Number of Children: 3  . Years of Education: N/A   Occupational History  . retireed    Social History Main Topics  . Smoking status: Former Smoker -- 2.00 packs/day for 30 years    Types: Cigarettes    Quit date: 05/13/1983  . Smokeless tobacco: Never Used  . Alcohol Use: Yes     Comment: 1-2 glaases wine per day  . Drug Use: No  . Sexually Active: Not on file   Other Topics Concern  . Not on file   Social History Narrative   Living in assisted living     Allergies  Allergen Reactions  . Ciprofloxacin Swelling  . Penicillins     REACTION: hives  . Prednisone   . Sulfamethoxazole     REACTION: Nausea/vomiting     Outpatient Prescriptions Prior to  Visit  Medication Sig Dispense Refill  . AMBULATORY NON FORMULARY MEDICATION Oxygen at bedtime-2lpm      . amLODipine (NORVASC) 2.5 MG tablet TAKE 1 TABLET BY MOUTH EVERY DAY  90 tablet  0  . aspirin 81 MG tablet Take 4 tablets (325 mg total) by mouth daily.      . Cetirizine HCl (ZYRTEC ALLERGY) 10 MG CAPS Take 1 capsule (10 mg total) by mouth daily.      . diazepam (VALIUM) 5 MG tablet take 1 tablet by mouth at bedtime if needed  30 tablet  3  . losartan (COZAAR) 50 MG tablet Take 1 tablet (50 mg total) by mouth daily.  90 tablet  3  . mometasone (NASONEX) 50 MCG/ACT nasal spray Place 2 sprays into the nose 2 (two) times daily.  41 g  4  . nitroGLYCERIN (NITROSTAT) 0.4 MG SL tablet Place 1 tablet (0.4 mg total) under the tongue every 5 (five) minutes as needed.  25 tablet  11  . omeprazole (PRILOSEC) 20 MG capsule TAKE ONE CAPSULE BY MOUTH EVERY DAY  90 capsule  3  . polyethylene glycol  powder (GLYCOLAX/MIRALAX) powder use 1/2 dose once daily  527 g  6  . NASONEX 50 MCG/ACT nasal spray USE 2 SPRAYS IN EACH NOSTRIL TWICE DAILY  51 g  1   No facility-administered medications prior to visit.     Review of Systems As above     Objective:   Physical ExamBP 118/78  Pulse 85  Temp(Src) 98.6 F (37 C) (Oral)  Ht 5\' 2"  (1.575 m)  Wt 131 lb (59.421 kg)  BMI 23.95 kg/m2  SpO2 96%  GEN: A/Ox3; pleasant , NAD, elderly  HEENT:  [place]/AT,  EACs-clear, TMs-wnl, NOSE-clear, THROAT-clear, no lesions, no postnasal drip or exudate noted.   NECK:  Supple w/ fair ROM; no JVD; normal carotid impulses w/o bruits; no thyromegaly or nodules palpated; no lymphadenopathy.  RESP  Coarse BS w/ no wheezingno accessory muscle use,   CARD:  RRR, no m/r/g  , no peripheral edema, pulses intact, no cyanosis or clubbing.  Musco: Warm bil, no deformities or joint swelling noted.   Neuro: alert, no focal deficits noted.    Skin: Warm, no lesions or rashes   Spirometry obtained 10/11/12: FEV1 greater than 80% predicted       Assessment & Plan:   Allergic rhinitis, seasonal Adding back nasonex to zyrtec  URI, acute - symptomatic relief > add chlorpheniramine or brompheniramine to ASA - discussed with her and daughter the signs sx of an AE COPD, explained that she is at risk for this and that she must call if she develops sx - taught albuterol via spacer.  - rov PW 1 mon    Updated Medication List Outpatient Encounter Prescriptions as of 11/18/2012  Medication Sig Dispense Refill  . AMBULATORY NON FORMULARY MEDICATION Oxygen at bedtime-2lpm      . amLODipine (NORVASC) 2.5 MG tablet TAKE 1 TABLET BY MOUTH EVERY DAY  90 tablet  0  . aspirin 81 MG tablet Take 4 tablets (325 mg total) by mouth daily.      . Cetirizine HCl (ZYRTEC ALLERGY) 10 MG CAPS Take 1 capsule (10 mg total) by mouth daily.      . diazepam (VALIUM) 5 MG tablet take 1 tablet by mouth at bedtime if needed  30 tablet  3   . losartan (COZAAR) 50 MG tablet Take 1 tablet (50 mg total) by mouth daily.  90  tablet  3  . mometasone (NASONEX) 50 MCG/ACT nasal spray Place 2 sprays into the nose 2 (two) times daily.  41 g  4  . nitroGLYCERIN (NITROSTAT) 0.4 MG SL tablet Place 1 tablet (0.4 mg total) under the tongue every 5 (five) minutes as needed.  25 tablet  11  . omeprazole (PRILOSEC) 20 MG capsule TAKE ONE CAPSULE BY MOUTH EVERY DAY  90 capsule  3  . polyethylene glycol powder (GLYCOLAX/MIRALAX) powder use 1/2 dose once daily  527 g  6  . Spacer/Aero-Holding Chambers (AEROCHAMBER MV) inhaler Use as instructed  1 each  0  . [DISCONTINUED] NASONEX 50 MCG/ACT nasal spray USE 2 SPRAYS IN EACH NOSTRIL TWICE DAILY  51 g  1   No facility-administered encounter medications on file as of 11/18/2012.

## 2012-11-18 NOTE — Assessment & Plan Note (Signed)
With her weakness particularly with standing up her blood pressure may be relatively too low for her I will decrease her losartan to 50 mg a day. No change in amlodipine.

## 2012-11-18 NOTE — Assessment & Plan Note (Signed)
Adding back nasonex to zyrtec

## 2012-11-18 NOTE — Assessment & Plan Note (Signed)
-   symptomatic relief > add chlorpheniramine or brompheniramine to ASA - discussed with her and daughter the signs sx of an AE COPD, explained that she is at risk for this and that she must call if she develops sx - taught albuterol via spacer.  - rov PW 1 mon

## 2012-11-18 NOTE — Patient Instructions (Addendum)
Medication change   DECREASE: Aspirin to 81mg                                     DECREASE:  Losartan to 50mg    Your physician wants you to follow-up in:  Dr. Dietrich Pates in 1 year.  You will receive a reminder letter in the mail two months in advance. If you don't receive a letter, please call our office to schedule the follow-up appointment.

## 2012-11-18 NOTE — Assessment & Plan Note (Signed)
Stable and subclinical. Continue current medical therapy and will decrease aspirin 81 mg a day.

## 2012-11-18 NOTE — Progress Notes (Signed)
HPI [name] [name] returns today for evaluation and management of her subclinical coronary artery disease, history of mitral valve prolapse, and history of hypertension.  Other than some generalized weakness and heaviness in her legs when she walks she has no complaints today. She does not walk a lot and uses a walker. She is in assisted living.  She also feels especially weak when she first gets up. She denies any syncope or presyncope.  Past Medical History  Diagnosis Date  . ANXIETY 08/16/2008  . CAD, UNSPECIFIED SITE 10/17/2008  . CHOLELITHIASIS 08/23/2008  . COPD 01/04/2010    FeV1 101%, DLCO64% 2008  . 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 DIVERTICULOSIS-COLON 04/07/2008  . GERD 04/19/2007  . HYPERSOMNIA, ASSOCIATED WITH SLEEP APNEA 02/01/2007  . HYPERTENSION 11/12/2006  . MITRAL VALVE PROLAPSE, HX OF 10/07/2008  . NEOPLASM, MALIGNANT, BREAST, RIGHT 02/15/2008  . OSTEOPOROSIS 04/04/2008  . Raynaud's syndrome 04/25/2009  . Atrial fibrillation   . Airway obstruction   . History of diverticulitis of colon   . Tortuous colon   . Anal stricture   . Chronic constipation   . Fatty liver   . Hiatal hernia   . Esophageal stricture   . IBS (irritable bowel syndrome)   . Pulmonary fibrosis   . Sleep apnea   . COPD (chronic obstructive pulmonary disease)   . Colitis     sigmoid  . Candida esophagitis   . Diverticulitis     Current Outpatient Prescriptions  Medication Sig Dispense Refill  . AMBULATORY NON FORMULARY MEDICATION Oxygen at bedtime-2lpm      . amLODipine (NORVASC) 2.5 MG tablet TAKE 1 TABLET BY MOUTH EVERY DAY  90 tablet  0  . aspirin 325 MG tablet Take 325 mg by mouth daily.        . Cetirizine HCl (ZYRTEC ALLERGY) 10 MG CAPS Take 1 capsule (10 mg total) by mouth daily.      . diazepam (VALIUM) 5 MG tablet take 1 tablet by mouth at bedtime if needed  30 tablet  3  . losartan (COZAAR) 100 MG tablet TAKE 1 TABLET BY MOUTH ONCE DAILY  90 tablet  0  . mometasone  (NASONEX) 50 MCG/ACT nasal spray Place 2 sprays into the nose 2 (two) times daily.  41 g  4  . NASONEX 50 MCG/ACT nasal spray USE 2 SPRAYS IN EACH NOSTRIL TWICE DAILY  51 g  1  . nitroGLYCERIN (NITROSTAT) 0.4 MG SL tablet Place 1 tablet (0.4 mg total) under the tongue every 5 (five) minutes as needed.  25 tablet  11  . omeprazole (PRILOSEC) 20 MG capsule TAKE ONE CAPSULE BY MOUTH EVERY DAY  90 capsule  3  . polyethylene glycol powder (GLYCOLAX/MIRALAX) powder use 1/2 dose once daily  527 g  6   No current facility-administered medications for this visit.    Allergies  Allergen Reactions  . Ciprofloxacin Swelling  . Penicillins     REACTION: hives  . Prednisone   . Sulfamethoxazole     REACTION: Nausea/vomiting    Family History  Problem Relation Age of Onset  . Parkinsonism Father   . Coronary artery disease Father   . Breast cancer      aunt  . Colon cancer      aunt, age 18  . Uterine cancer      aunt  . Stroke Mother   . Heart failure Mother   . Cancer Maternal Aunt     breast  History   Social History  . Marital Status: Widowed    Spouse Name: N/A    Number of Children: 3  . Years of Education: N/A   Occupational History  . retireed    Social History Main Topics  . Smoking status: Former Smoker -- 2.00 packs/day for 30 years    Types: Cigarettes    Quit date: 05/13/1983  . Smokeless tobacco: Never Used  . Alcohol Use: Yes     Comment: 1-2 glaases wine per day  . Drug Use: No  . Sexually Active: Not on file   Other Topics Concern  . Not on file   Social History Narrative   Living in assisted living    ROS ALL NEGATIVE EXCEPT THOSE NOTED IN HPI  PE  General Appearance: well developed, well nourished in no acute distress, looks younger than stated HEENT: symmetrical face, PERRLA, good dentition  Neck: no JVD, thyromegaly, or adenopathy, trachea midline Chest: symmetric without deformity Cardiac: PMI non-displaced, RRR, normal S1, S2, no gallop  or murmur Lung: clear to ausculation and percussion Vascular: all pulses full without bruits, decreased capillary refill  Abdominal: nondistended, nontender, good bowel sounds, no HSM, no bruits Extremities: no cyanosis, clubbing or edema, no sign of DVT, no varicosities  Skin: normal color, no rashes Neuro: alert and oriented x 3, non-focal Pysch: normal affect  EKG Normal sinus rhythm with right atrial enlargement BMET    Component Value Date/Time   NA 135 05/20/2012 1509   K 4.6 05/20/2012 1509   CL 99 05/20/2012 1509   CO2 29 05/20/2012 1509   GLUCOSE 99 05/20/2012 1509   BUN 12 05/20/2012 1509   CREATININE 0.8 05/20/2012 1509   CALCIUM 9.1 05/20/2012 1509   GFRNONAA 83* 03/06/2011 0605   GFRAA >90 03/06/2011 0605    Lipid Panel     Component Value Date/Time   CHOL 158 03/03/2011 0500   TRIG 98 03/03/2011 0500   HDL 60.40 06/27/2010 1032   CHOLHDL 3 06/27/2010 1032   VLDL 17.0 06/27/2010 1032   LDLCALC 117* 06/27/2010 1032    CBC    Component Value Date/Time   WBC 6.4 05/20/2012 1509   RBC 4.56 05/20/2012 1509   HGB 13.2 05/20/2012 1509   HCT 39.8 05/20/2012 1509   PLT 210.0 05/20/2012 1509   MCV 87.2 05/20/2012 1509   MCH 29.8 03/06/2011 0605   MCHC 33.2 05/20/2012 1509   RDW 14.3 05/20/2012 1509   LYMPHSABS 2.0 05/20/2012 1509   MONOABS 0.5 05/20/2012 1509   EOSABS 0.2 05/20/2012 1509   BASOSABS 0.0 05/20/2012 1509

## 2012-11-21 ENCOUNTER — Telehealth: Payer: Self-pay | Admitting: Pulmonary Disease

## 2012-11-21 MED ORDER — DOXYCYCLINE HYCLATE 100 MG PO TABS: 100.0000 mg | ORAL_TABLET | Freq: Two times a day (BID) | ORAL | Status: DC

## 2012-11-21 NOTE — Telephone Encounter (Signed)
Pt called and feels she is getting worse.  Increased congestion in head and chest, higher temp to 101.  Some increased sob, but sats 91%.  Will send in abx to cover sinobronchitis.  Allergic to pcn, sulfa, cipro.  Will call in doxycycline.  Also allergic to prednisone, so asked her to use rescue inhaler as needed.  To go to er if she worsens.

## 2012-11-22 ENCOUNTER — Ambulatory Visit (INDEPENDENT_AMBULATORY_CARE_PROVIDER_SITE_OTHER): Payer: Medicare Other | Admitting: Internal Medicine

## 2012-11-22 ENCOUNTER — Encounter: Payer: Self-pay | Admitting: Internal Medicine

## 2012-11-22 VITALS — BP 104/70 | HR 68 | Temp 98.0°F | Wt 130.0 lb

## 2012-11-22 DIAGNOSIS — R5383 Other fatigue: Secondary | ICD-10-CM | POA: Diagnosis not present

## 2012-11-22 DIAGNOSIS — R5381 Other malaise: Secondary | ICD-10-CM | POA: Diagnosis not present

## 2012-11-22 LAB — BASIC METABOLIC PANEL
BUN: 12 mg/dL (ref 6–23)
Chloride: 102 mEq/L (ref 96–112)
Glucose, Bld: 102 mg/dL — ABNORMAL HIGH (ref 70–99)
Potassium: 5.8 mEq/L — ABNORMAL HIGH (ref 3.5–5.1)
Sodium: 135 mEq/L (ref 135–145)

## 2012-11-22 LAB — CBC WITH DIFFERENTIAL/PLATELET
Basophils Relative: 0.3 % (ref 0.0–3.0)
Eosinophils Absolute: 0.2 10*3/uL (ref 0.0–0.7)
Eosinophils Relative: 3.5 % (ref 0.0–5.0)
Hemoglobin: 14 g/dL (ref 12.0–15.0)
Lymphocytes Relative: 26.3 % (ref 12.0–46.0)
MCHC: 33.3 g/dL (ref 30.0–36.0)
MCV: 86.1 fl (ref 78.0–100.0)
Neutro Abs: 3.4 10*3/uL (ref 1.4–7.7)
RBC: 4.89 Mil/uL (ref 3.87–5.11)
WBC: 5.8 10*3/uL (ref 4.5–10.5)

## 2012-11-22 LAB — SEDIMENTATION RATE: Sed Rate: 13 mm/hr (ref 0–22)

## 2012-11-22 NOTE — Progress Notes (Signed)
She comes in for f/u-daughter is present with her. Has acute complaints- This weekend had temp 102- reviewed pulmonary note and Dr. Shelle Iron phone note She is feeling better (less chest tightness) Breathing is better She did have chills and sweats last night.  GI-- has had some diarrhea with eating.   Ros- fever, chills, states that "legs won't carry me down the long hallway"- she is able to do the walking.   Physical exam: Review vital signs. Elderly female in no acute distress. Neck supple without lymphadenopathy Chest clear to auscultation without wheezing, rhonchi, signs of consolidation Cardiac exam S1-S2 are regular, Extremities no edema.  Assessment and plan: Patient with febrile illness. I agree with completion of doxycycline. She will call if her symptoms worsen. Specifically she will call for shortness of breath, worsening fever, general fatigue.

## 2012-11-26 DIAGNOSIS — M1710 Unilateral primary osteoarthritis, unspecified knee: Secondary | ICD-10-CM | POA: Diagnosis not present

## 2012-12-06 DIAGNOSIS — M1710 Unilateral primary osteoarthritis, unspecified knee: Secondary | ICD-10-CM | POA: Diagnosis not present

## 2012-12-07 ENCOUNTER — Other Ambulatory Visit (INDEPENDENT_AMBULATORY_CARE_PROVIDER_SITE_OTHER): Payer: Medicare Other

## 2012-12-07 DIAGNOSIS — I10 Essential (primary) hypertension: Secondary | ICD-10-CM | POA: Diagnosis not present

## 2012-12-07 LAB — BASIC METABOLIC PANEL
BUN: 10 mg/dL (ref 6–23)
Chloride: 103 mEq/L (ref 96–112)
Creatinine, Ser: 0.9 mg/dL (ref 0.4–1.2)
Glucose, Bld: 85 mg/dL (ref 70–99)
Potassium: 5 mEq/L (ref 3.5–5.1)

## 2012-12-21 ENCOUNTER — Encounter: Payer: Self-pay | Admitting: *Deleted

## 2013-01-04 ENCOUNTER — Ambulatory Visit (INDEPENDENT_AMBULATORY_CARE_PROVIDER_SITE_OTHER): Payer: Medicare Other | Admitting: Critical Care Medicine

## 2013-01-04 ENCOUNTER — Encounter: Payer: Self-pay | Admitting: Critical Care Medicine

## 2013-01-04 VITALS — BP 114/64 | HR 74 | Temp 97.7°F | Ht 62.0 in | Wt 129.2 lb

## 2013-01-04 DIAGNOSIS — K8020 Calculus of gallbladder without cholecystitis without obstruction: Secondary | ICD-10-CM | POA: Diagnosis not present

## 2013-01-04 DIAGNOSIS — J439 Emphysema, unspecified: Secondary | ICD-10-CM

## 2013-01-04 DIAGNOSIS — J438 Other emphysema: Secondary | ICD-10-CM

## 2013-01-04 DIAGNOSIS — R3129 Other microscopic hematuria: Secondary | ICD-10-CM | POA: Diagnosis not present

## 2013-01-04 DIAGNOSIS — R82998 Other abnormal findings in urine: Secondary | ICD-10-CM | POA: Diagnosis not present

## 2013-01-04 DIAGNOSIS — R109 Unspecified abdominal pain: Secondary | ICD-10-CM | POA: Diagnosis not present

## 2013-01-04 NOTE — Progress Notes (Signed)
Subjective:    Patient ID: [name] [name], female    DOB: 1923/06/12, 79 y.o.   MRN: [id number]  HPI  This is a 79 y.o. 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 The pt also has hx of upper airway obstruction with difficult intubation with prior operative procedures. Also hx Breast CA s/p resection and XRT. Hx Gallstone disease with conservative Rx due to difficult intubation status.   10/11/2012 Pt with freq UTIs. Sees PCP.  Allergies bad this year. Now not much cough. Mucus does drain from the head. No real wheeze.  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 Notes some pndrip  No mucus from lungs  Acute OV 11/18/12 -- hx COPD, MAIC, UA irritation followed by Dr Timoteo Ace.  She has been well until 2 -3 days ago. Some leg weakness, fatigue. Lots of nasal congestion. Throat irritation. Probable fever last night. She ran out of nasonex a couple weeks ago.  Has taken ASA. Is on zyrtec qd.   01/04/2013 Chief Complaint  Patient presents with  . Follow-up    pt reports she has been feeling "pretty good"-- having some head congestion and drainage but overall doing well  Pt with UTI recurrent. Urology, bladder is painful.  Incontinent Dysuria , chills etc.  Not on much ABX Dyspnea still with exertion.  Pt still with pndrip and clear mucus.    Past Medical History  Diagnosis Date  . ANXIETY 08/16/2008  . CAD, UNSPECIFIED SITE 10/17/2008  . CHOLELITHIASIS 08/23/2008  . COPD 01/04/2010    FeV1 101%, DLCO64% 2008  . 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 DIVERTICULOSIS-COLON 04/07/2008  . GERD 04/19/2007  . HYPERSOMNIA, ASSOCIATED WITH SLEEP APNEA 02/01/2007  . HYPERTENSION 11/12/2006  . MITRAL VALVE PROLAPSE, HX OF 10/07/2008  . NEOPLASM, MALIGNANT, BREAST, RIGHT 02/15/2008  . OSTEOPOROSIS 04/04/2008  . Raynaud's syndrome 04/25/2009  . Atrial fibrillation   . Airway obstruction   . History of diverticulitis of  colon   . Tortuous colon   . Anal stricture   . Chronic constipation   . Fatty liver   . Hiatal hernia   . Esophageal stricture   . IBS (irritable bowel syndrome)   . Pulmonary fibrosis   . Sleep apnea   . COPD (chronic obstructive pulmonary disease)   . Colitis     sigmoid  . Candida esophagitis   . Diverticulitis      Family History  Problem Relation Age of Onset  . Parkinsonism Father   . Coronary artery disease Father   . Breast cancer      aunt  . Colon cancer      aunt, age 64  . Uterine cancer      aunt  . Stroke Mother   . Heart failure Mother   . Cancer Maternal Aunt     breast     History   Social History  . Marital Status: Widowed    Spouse Name: N/A    Number of Children: 3  . Years of Education: N/A   Occupational History  . retireed    Social History Main Topics  . Smoking status: Former Smoker -- 2.00 packs/day for 30 years    Types: Cigarettes    Quit date: 05/13/1983  . Smokeless tobacco: Never Used  . Alcohol Use: Yes     Comment: 1-2 glaases wine per day  . Drug Use: No  . Sexual Activity:  Not on file   Other Topics Concern  . Not on file   Social History Narrative   Living in assisted living     Allergies  Allergen Reactions  . Ciprofloxacin Swelling  . Penicillins     REACTION: hives  . Prednisone   . Sulfamethoxazole     REACTION: Nausea/vomiting     Outpatient Prescriptions Prior to Visit  Medication Sig Dispense Refill  . albuterol (PROVENTIL HFA;VENTOLIN HFA) 108 (90 BASE) MCG/ACT inhaler Inhale 2 puffs into the lungs every 6 (six) hours as needed for wheezing.      . AMBULATORY NON FORMULARY MEDICATION Oxygen at bedtime-2lpm      . amLODipine (NORVASC) 2.5 MG tablet TAKE 1 TABLET BY MOUTH EVERY DAY  90 tablet  0  . aspirin 81 MG tablet Take 4 tablets (325 mg total) by mouth daily.      . Cetirizine HCl (ZYRTEC ALLERGY) 10 MG CAPS Take 1 capsule (10 mg total) by mouth daily.      . diazepam (VALIUM) 5 MG tablet  take 1 tablet by mouth at bedtime if needed  30 tablet  3  . losartan (COZAAR) 50 MG tablet Take 1 tablet (50 mg total) by mouth daily.  90 tablet  3  . nitroGLYCERIN (NITROSTAT) 0.4 MG SL tablet Place 1 tablet (0.4 mg total) under the tongue every 5 (five) minutes as needed.  25 tablet  11  . omeprazole (PRILOSEC) 20 MG capsule TAKE ONE CAPSULE BY MOUTH EVERY DAY  90 capsule  3  . polyethylene glycol powder (GLYCOLAX/MIRALAX) powder use 1/2 dose once daily  527 g  6  . Spacer/Aero-Holding Chambers (AEROCHAMBER MV) inhaler Use as instructed  1 each  0  . mometasone (NASONEX) 50 MCG/ACT nasal spray Place 2 sprays into the nose 2 (two) times daily.  41 g  4  . doxycycline (VIBRA-TABS) 100 MG tablet Take 1 tablet (100 mg total) by mouth 2 (two) times daily.  14 tablet  0   No facility-administered medications prior to visit.     Review of Systems  As above     Objective:   Physical Exam BP 114/64  Pulse 74  Temp(Src) 97.7 F (36.5 C) (Oral)  Ht 5\' 2"  (1.575 m)  Wt 129 lb 3.2 oz (58.605 kg)  BMI 23.63 kg/m2  SpO2 94%  GEN: A/Ox3; pleasant , NAD, elderly  HEENT:  [place]/AT,  EACs-clear, TMs-wnl, NOSE-clear, THROAT-clear, no lesions, no postnasal drip or exudate noted.   NECK:  Supple w/ fair ROM; no JVD; normal carotid impulses w/o bruits; no thyromegaly or nodules palpated; no lymphadenopathy.  RESP  Coarse BS w/ no wheezingno accessory muscle use,   CARD:  RRR, no m/r/g  , no peripheral edema, pulses intact, no cyanosis or clubbing.  Musco: Warm bil, no deformities or joint swelling noted.   Neuro: alert, no focal deficits noted.    Skin: Warm, no lesions or rashes         Assessment & Plan:   COPD with emphysema, gold stage A Gold stage A  COPD with nocturnal oxygen therapy stable at this time  Plan Continue nocturnal oxygen therapy Maintain inhaled medications as prescribed    Updated Medication List Outpatient Encounter Prescriptions as of 01/04/2013   Medication Sig Dispense Refill  . albuterol (PROVENTIL HFA;VENTOLIN HFA) 108 (90 BASE) MCG/ACT inhaler Inhale 2 puffs into the lungs every 6 (six) hours as needed for wheezing.      . AMBULATORY NON FORMULARY  MEDICATION Oxygen at bedtime-2lpm      . amLODipine (NORVASC) 2.5 MG tablet TAKE 1 TABLET BY MOUTH EVERY DAY  90 tablet  0  . aspirin 81 MG tablet Take 4 tablets (325 mg total) by mouth daily.      . Cetirizine HCl (ZYRTEC ALLERGY) 10 MG CAPS Take 1 capsule (10 mg total) by mouth daily.      . diazepam (VALIUM) 5 MG tablet take 1 tablet by mouth at bedtime if needed  30 tablet  3  . losartan (COZAAR) 50 MG tablet Take 1 tablet (50 mg total) by mouth daily.  90 tablet  3  . mometasone (NASONEX) 50 MCG/ACT nasal spray Place 1 spray into the nose 2 (two) times daily.      . nitroGLYCERIN (NITROSTAT) 0.4 MG SL tablet Place 1 tablet (0.4 mg total) under the tongue every 5 (five) minutes as needed.  25 tablet  11  . omeprazole (PRILOSEC) 20 MG capsule TAKE ONE CAPSULE BY MOUTH EVERY DAY  90 capsule  3  . polyethylene glycol powder (GLYCOLAX/MIRALAX) powder use 1/2 dose once daily  527 g  6  . Spacer/Aero-Holding Chambers (AEROCHAMBER MV) inhaler Use as instructed  1 each  0  . [DISCONTINUED] mometasone (NASONEX) 50 MCG/ACT nasal spray Place 2 sprays into the nose 2 (two) times daily.  41 g  4  . [DISCONTINUED] doxycycline (VIBRA-TABS) 100 MG tablet Take 1 tablet (100 mg total) by mouth 2 (two) times daily.  14 tablet  0   No facility-administered encounter medications on file as of 01/04/2013.

## 2013-01-04 NOTE — Patient Instructions (Addendum)
No change in medications. Return in        6 months        

## 2013-01-05 NOTE — Assessment & Plan Note (Addendum)
Gold stage A  COPD with nocturnal oxygen therapy stable at this time  Plan Continue nocturnal oxygen therapy Maintain inhaled medications as prescribed

## 2013-01-06 ENCOUNTER — Encounter: Payer: Self-pay | Admitting: Internal Medicine

## 2013-01-06 ENCOUNTER — Telehealth: Payer: Self-pay | Admitting: Critical Care Medicine

## 2013-01-06 DIAGNOSIS — R918 Other nonspecific abnormal finding of lung field: Secondary | ICD-10-CM

## 2013-01-06 NOTE — Telephone Encounter (Signed)
Pt daughter stated pt saw [facility] for UTI and lower abdominal pain. They did CT abdomen and was advised yesterday with the results. Was told it showed diverticulosis and 3 nodules on her lungs. The radiologists recommended a repeat CT in 3-6 months. [facility] images can be pulled up in the PACS. Please advise Dr. Delford Field thanks

## 2013-01-06 NOTE — Telephone Encounter (Signed)
lmomtcb for pt's daughter.  I have already placed order for CT Chest in 6 months.

## 2013-01-06 NOTE — Telephone Encounter (Signed)
Call daughter, I looked at the CTs, these look benign but order CT Chest non contrast in 6 months to compare

## 2013-01-07 NOTE — Telephone Encounter (Signed)
Spoke with pt's daughter - Informed her of results and recs per Dr. Delford Field.  She verbalized understanding and is aware she will receive another call to have CT Chest scheduled for Feb 2015.

## 2013-01-18 DIAGNOSIS — IMO0002 Reserved for concepts with insufficient information to code with codable children: Secondary | ICD-10-CM | POA: Diagnosis not present

## 2013-01-21 ENCOUNTER — Encounter: Payer: Self-pay | Admitting: Adult Health

## 2013-01-25 ENCOUNTER — Other Ambulatory Visit: Payer: Self-pay | Admitting: Critical Care Medicine

## 2013-01-31 DIAGNOSIS — IMO0002 Reserved for concepts with insufficient information to code with codable children: Secondary | ICD-10-CM | POA: Diagnosis not present

## 2013-02-07 ENCOUNTER — Other Ambulatory Visit: Payer: Self-pay | Admitting: Orthopedic Surgery

## 2013-02-07 ENCOUNTER — Telehealth: Payer: Self-pay | Admitting: Internal Medicine

## 2013-02-07 DIAGNOSIS — IMO0002 Reserved for concepts with insufficient information to code with codable children: Secondary | ICD-10-CM | POA: Diagnosis not present

## 2013-02-07 DIAGNOSIS — R52 Pain, unspecified: Secondary | ICD-10-CM

## 2013-02-07 NOTE — Telephone Encounter (Signed)
Patient Information:  Caller Name: Lynden Ang  Phone: [phone]  Patient: [name] [name]  Gender: Female  DOB: 1924-02-20  Age: 79 Years  PCP: Birdie Sons (Adults only)  Office Follow Up:  Does the office need to follow up with this patient?: Yes  Instructions For The Office: Daughter declines appt. due to  patient being  immobile related to a  to leg injury 2 weeks ago that is being treated by an Orthopedist. States patient has an MRI of her leg scheduled for 02/09/13. Daughter is requesting that an antibiotic, for Urinary Tract Infection,  be called into [facility] on [address]. at [phone]. Daughter can be reached at [phone].  RN Note:  Daughter states patient has history of frequent Urinary Tract Infections. States patient developed pain with urination, urinary frequency and urgency. Onset 02/06/13. Patient is voiding small, frequent amounts. Afebrile. Patient taking fluids. Denies hematuria. Denies flank pain. Care advice given per guidelines. Daughter declines appt. Daughter states patient is immobile due to leg injury 2 weeks ago that is being treated by an Orthopedist. States patient has an MRI of her leg scheduled for 02/09/13. Daughter is requesting that an antibiotic be called into [facility] on [address]. at [phone]. Daughter can be reached at [phone]. Care advice given per guidelines. Call back parameters reviewed. Daughter verbalizes understanding.  Symptoms  Reason For Call & Symptoms: Pain and burning with urination  Reviewed Health History In EMR: Yes  Reviewed Medications In EMR: Yes  Reviewed Allergies In EMR: Yes  Reviewed Surgeries / Procedures: Yes  Date of Onset of Symptoms: 02/06/2013  Guideline(s) Used:  Urination Pain - Female  Disposition Per Guideline:   See Today in Office  Reason For Disposition Reached:   > 2 UTIs in last year  Advice Given:  Fluids:   Drink extra fluids. Drink 8-10 glasses of liquids a day  (Reason: to produce a dilute, non-irritating urine).  Warm Saline SITZ Baths to Reduce Pain:  Sit in a warm saline bath for 20 minutes to cleanse the area and to reduce pain. Add 2 oz. of table salt or baking soda to a tub of water.  Call Back If:  You become worse.  CAUTION - Phenazopyridine:  It turns the urine bright orange. This can cause staining of underwear. It can also sometimes stain contact lenses.  OTC Phenazopyrine for Severe Dysuria and Frequency:  Phenazopyridine (Uristat) is available OTC (In [place] only). Dosage is 2 pills by mouth three times a day.  Patient Refused Recommendation:  Patient Requests Prescription  Daughter declines appt. due to  patient being  immobile related to a  to leg injury 2 weeks ago that is being treated by an Orthopedist. States patient has an MRI of her leg scheduled for 02/09/13. Daughter is requesting that an antibiotic, for Urinary Tract Infection,  be called into [facility] on [address]. at [phone]. Daughter can be reached at [phone].

## 2013-02-08 MED ORDER — NITROFURANTOIN MACROCRYSTAL 100 MG PO CAPS: 100.0000 mg | ORAL_CAPSULE | Freq: Two times a day (BID) | ORAL | Status: DC

## 2013-02-08 NOTE — Telephone Encounter (Signed)
rx sent in electronically, [facility] aware

## 2013-02-08 NOTE — Telephone Encounter (Signed)
Macrodantin 100 mg po bid for 7 days

## 2013-02-09 ENCOUNTER — Ambulatory Visit
Admission: RE | Admit: 2013-02-09 | Discharge: 2013-02-09 | Disposition: A | Discharge status: Home / Self Care | Payer: Medicare Other | Source: Ambulatory Visit | Attending: Orthopedic Surgery | Admitting: Orthopedic Surgery

## 2013-02-09 DIAGNOSIS — R52 Pain, unspecified: Secondary | ICD-10-CM

## 2013-02-09 DIAGNOSIS — IMO0002 Reserved for concepts with insufficient information to code with codable children: Secondary | ICD-10-CM | POA: Diagnosis not present

## 2013-02-15 DIAGNOSIS — IMO0002 Reserved for concepts with insufficient information to code with codable children: Secondary | ICD-10-CM | POA: Diagnosis not present

## 2013-02-22 ENCOUNTER — Encounter (HOSPITAL_COMMUNITY): Payer: Self-pay | Admitting: Respiratory Therapy

## 2013-02-23 NOTE — H&P (Signed)
  [facility] [address]   [address] [place], [place] [address] [phone] A Division of [facility]  Loreta Ave, M.D.   Robert A. Thurston Hole, M.D.   Burnell Blanks, M.D.   Eulas Post, M.D.   Lunette Stands, M.D Jewel Baize. Eulah Pont, M.D.  Buford Dresser, M.D.  Charlsie Quest, M.D.  Estell Harpin, M.D.   Melina Fiddler, M.D. Danford Bad. Willa Rough, PA-C  Kirstin A. Shepperson, PA-C  Josh [place], PA-C [place], [place]   RE: [place], [place]                                [id number]      DOB: October 16, 1923 PROGRESS NOTE: 02-15-13 Deb is seen in consultation today at the request of Dr. Charlett Blake.  This is a very spry active 79 year-old female.  Relatively abrupt onset of acute mechanical symptoms, right knee.  Conservative treatment including medication and injection by Dr. Charlett Blake without resolution.  Her degree of mechanical symptoms profound and it has really limited all of her activity.  Standing x-rays back in September really showed minimal degenerative change.  With failure of conservative treatment an MRI was completed.  This is avulsion medial meniscus posterior root with displaced large fragment.  No significant degenerative change.  Ruptured Bakers cyst.  Ligaments otherwise intact.  Based on all this information the only real viable curative option we have is an arthroscopy.  Dr. Charlett Blake has gone to great lengths to try to avoid this because of the patient's age.  From her point of view she would rather proceed, as this is really making it so she can't walk and having difficulty with all activities.  I met with her to discuss definitive treatment with arthroscopy.  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  She has no respiratory symptoms, but this was found at a different procedure by Dr. Edwyna Shell.  Pulmonologist is Dr. Delford Field.   History, workup and treatment to date is reviewed.     EXAMINATION: On exam she is a very spry, very with it 79 year-old.  Exquisitely tender medial joint line, right knee.  Positive McMurray's.  Fairly good motion.  Stable ligaments.    DISPOSITION:  The only viable option I have here is arthroscopy.  Maidie and her daughter, who is with her, thoroughly understand and want to proceed.  Because of this issue with her trachea my premise would be to do a knee block/femoral nerve block and try to do this under local as much as possible.  To be on the safe side I want to get pre-op anesthesia consult in regards to her airway.  If she gets clearance to proceed we will plan on proceeding with procedure as stated above.  This has been thoroughly discussed with Corrie Dandy and her daughter.  More than 25 minutes spent face-to-face covering things with them.  Paperwork complete.  All questions answered.  Hopefully I will see her at the time of outpatient operative intervention.    Loreta Ave, M.D.   Electronically verified by Loreta Ave, M.D. DFM:jjh D 02-15-13 T 02-16-13

## 2013-02-24 ENCOUNTER — Encounter (HOSPITAL_COMMUNITY)
Admission: RE | Admit: 2013-02-24 | Discharge: 2013-02-24 | Disposition: A | Discharge status: Home / Self Care | Payer: Medicare Other | Source: Ambulatory Visit | Attending: Orthopedic Surgery | Admitting: Orthopedic Surgery

## 2013-02-24 ENCOUNTER — Encounter (HOSPITAL_COMMUNITY): Payer: Self-pay

## 2013-02-24 ENCOUNTER — Encounter (HOSPITAL_COMMUNITY): Payer: Self-pay | Admitting: Vascular Surgery

## 2013-02-24 DIAGNOSIS — Z01812 Encounter for preprocedural laboratory examination: Secondary | ICD-10-CM | POA: Insufficient documentation

## 2013-02-24 DIAGNOSIS — Z01818 Encounter for other preprocedural examination: Secondary | ICD-10-CM | POA: Insufficient documentation

## 2013-02-24 HISTORY — DX: Presence of external hearing-aid: Z97.4

## 2013-02-24 LAB — CBC
HCT: 41.4 % (ref 36.0–46.0)
MCH: 28.5 pg (ref 26.0–34.0)
MCHC: 34.1 g/dL (ref 30.0–36.0)
RDW: 14.9 % (ref 11.5–15.5)
WBC: 7.1 10*3/uL (ref 4.0–10.5)

## 2013-02-24 LAB — BASIC METABOLIC PANEL
BUN: 11 mg/dL (ref 6–23)
Chloride: 102 mEq/L (ref 96–112)
Creatinine, Ser: 0.82 mg/dL (ref 0.50–1.10)
GFR calc Af Amer: 72 mL/min — ABNORMAL LOW (ref >90)
GFR calc non Af Amer: 63 mL/min — ABNORMAL LOW (ref >90)
Potassium: 5.6 mEq/L — ABNORMAL HIGH (ref 3.5–5.1)

## 2013-02-24 LAB — ABO/RH: ABO/RH(D): O POS

## 2013-02-24 LAB — TYPE AND SCREEN: Antibody Screen: NEGATIVE

## 2013-02-24 LAB — SURGICAL PCR SCREEN: MRSA, PCR: NEGATIVE

## 2013-02-24 MED ORDER — CHLORHEXIDINE GLUCONATE 4 % EX LIQD: 60.0000 mL | Freq: Once | CUTANEOUS | Status: DC

## 2013-02-24 NOTE — Pre-Procedure Instructions (Signed)
[name] [name]  02/24/2013   Your procedure is scheduled on:  March 01, 2013  Report to [facility] Entrance A at 5:30 AM.  Call this number if you have problems the morning of surgery: [phone]   Remember:   Do not eat food or drink liquids after midnight.   Take these medicines the morning of surgery with A SIP OF WATER: albuterol (PROVENTIL HFA;VENTOLIN HFA)    Do not wear jewelry, make-up or nail polish.  Do not wear lotions, powders, or perfumes. You may wear deodorant.  Do not shave 48 hours prior to surgery. Men may shave face and neck.  Do not bring valuables to the hospital.  [facility] is not responsible                  for any belongings or valuables.               Contacts, dentures or bridgework may not be worn into surgery.  Leave suitcase in the car. After surgery it may be brought to your room.  For patients admitted to the hospital, discharge time is determined by your                treatment team.               Patients discharged the day of surgery will not be allowed to drive  home.  Name and phone number of your driver:   Special Instructions: Shower using CHG 2 nights before surgery and the night before surgery.  If you shower the day of surgery use CHG.  Use special wash - you have one bottle of CHG for all showers.  You should use approximately 1/3 of the bottle for each shower.   Please read over the following fact sheets that you were given: Pain Booklet, Coughing and Deep Breathing and Surgical Site Infection Prevention

## 2013-02-24 NOTE — Anesthesia Preprocedure Evaluation (Deleted)
Anesthesia Evaluation Anesthesia Physical Anesthesia Plan  ASA:   Anesthesia Plan:    Post-op Pain Management:    Induction:   Airway Management Planned:   Additional Equipment:   Intra-op Plan:   Post-operative Plan:   Informed Consent:   Plan Discussed with:   Anesthesia Plan Comments: (See my anesthesia note.  Per Dr. Delford Field, patient has 5 mm airway.  Also with OSA (uses 2L/[place]), COPD.  Dr. Eulah Pont anticipates nerve block and local for this procedure.  Shonna Chock, PA-C)        Anesthesia Quick Evaluation

## 2013-02-24 NOTE — Progress Notes (Signed)
Anesthesia PAT Evaluation:  Patient is a 79 year old female scheduled for right knee arthroscopy due to a medial meniscus tear on 03/01/13 by Dr. Margarita Rana.  She provides a card (handwritten by Dr. 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  BMI ~ 25. Pulmonologist is Dr. Delford Field, last visit 01/05/13.  She has been evaluated by ENT Dr. Lazarus Salines, but not in many years. PCP is Dr. Birdie Sons.  She reports that she has always had a small airway making her a difficult intubation, but in 1999 she developed bleeding following a left thoracotomy for LLL wedge resection for what was thought to be cancer but was ultimately found to be mycobacterium avium.  She subsequently required two other emergency procedures within the following 12 hours.  By her report, anesthesia team was having difficultly with advancing her airway, and had a traumatic emergent intubation by her thoracic surgeon.  She denies stridor or SOB at rest.  She sleeps slightly upright on a small wedge without difficulty.    PFTs on 10/11/2012 showed FVC 1.46 (67%), 1.27 (82%), FEF25-75% 2.09 (186%). Moderate restriction.  Cardiologist is Dr. Daleen Squibb, last visit 11/18/12.  Continued  medical therapy for CAD was recommended. She denies any chest pain, new DOE, or significant edema. Heart RRR, no significant murmur noted. Lungs clear. No pitting edema.  No carotid bruits noted.  EKG on 11/18/12 showed SR with occasional PVC's right atrial enlargement.  Echo on 05/24/08 showed: - Overall left ventricular systolic function was normal. Left ventricular ejection fraction was estimated , range being 55% to 60 %. There were no left ventricular regional wall motion abnormalities. Left ventricular wall thickness was mildly increased. There was mild focal basal septal hypertrophy. - Aortic valve thickness was mildly increased. - There was mild mitral valvular regurgitation. - Mild tricuspid regurgitation.  CXR on 05/19/12 showed no acute cardiopulmonary abnormality seen.  Preoperative labs noted.  She has mild hyperkalemia. She is not on a KCL supplement, but is taking an ARB.  Will order an ISTAT for the day of surgery to re-evaluate for hyperkalemia--to ensure K+ remains < 6.0..    Her ASA is on hold for surgery.  She feels at baseline from a cardiopulmonary standpoint.  I did review the proposed anesthesia plan with Dr. Michelle Piper. It is anticipated that she can have this procedure with a nerve block and local.  I did discuss probable use of MAC as well, but will also need to keep her airway issues and OSA/COPD history in mind.  Although not anticipated, I did discuss that a LMA could be close at hand if needed.  She and her daughter are comfortable talking further with her assigned anesthesiologist on the day of surgery.  Velna Ochs [facility] Short Stay Center/Anesthesiology Phone [phone] 02/24/2013 5:01 PM

## 2013-02-28 ENCOUNTER — Telehealth: Payer: Self-pay | Admitting: Internal Medicine

## 2013-02-28 NOTE — Telephone Encounter (Signed)
Pt called stating that she is having knee surgery tomorrow at 5:30 am.  Pt began taking nitrofurantoin 100 mg this morning.  Per patient, you prescribed this to her on 02/08/13 for a UTI.  Pt began having symptoms of a UTI today.  Pt says she feels fine and wants to know can she still go through with the knee surgery with Dr. Renaye Rakers tomorrow since she is taking antibotics. Please advise.  If you cannot reach patient, please leave a message and if you would like she says you can call Dr. Eulah Pont and let them know that the surgery can still proceed at [phone].

## 2013-03-02 DIAGNOSIS — Z23 Encounter for immunization: Secondary | ICD-10-CM | POA: Diagnosis not present

## 2013-03-03 ENCOUNTER — Other Ambulatory Visit: Payer: Self-pay | Admitting: *Deleted

## 2013-03-03 NOTE — Telephone Encounter (Signed)
Pt will take a total of 5 days of macrodantin- and then have surgery 10-27-doesn't want to cancel surgery again

## 2013-03-03 NOTE — Telephone Encounter (Signed)
They will make decision at surgery-

## 2013-03-04 ENCOUNTER — Ambulatory Visit (INDEPENDENT_AMBULATORY_CARE_PROVIDER_SITE_OTHER): Payer: Medicare Other | Admitting: Family Medicine

## 2013-03-04 ENCOUNTER — Encounter: Payer: Self-pay | Admitting: Family Medicine

## 2013-03-04 VITALS — BP 126/60 | HR 101 | Temp 98.4°F | Wt 130.0 lb

## 2013-03-04 DIAGNOSIS — R509 Fever, unspecified: Secondary | ICD-10-CM | POA: Diagnosis not present

## 2013-03-04 DIAGNOSIS — J438 Other emphysema: Secondary | ICD-10-CM | POA: Diagnosis not present

## 2013-03-04 DIAGNOSIS — I2510 Atherosclerotic heart disease of native coronary artery without angina pectoris: Secondary | ICD-10-CM | POA: Diagnosis not present

## 2013-03-04 DIAGNOSIS — J439 Emphysema, unspecified: Secondary | ICD-10-CM

## 2013-03-04 DIAGNOSIS — N390 Urinary tract infection, site not specified: Secondary | ICD-10-CM

## 2013-03-04 NOTE — Progress Notes (Signed)
Spoke with pt's daughter, Ms. Fredricka Bonine about date and time change of surgery. She states she is on the way to take her mother to the doctor for a low grade fever. She asked whom should she call if there is a problem, I told her to call her mother's Careers adviser.

## 2013-03-04 NOTE — Progress Notes (Signed)
  Subjective:    Patient ID: [name] [name], female    DOB: [birth year], 79 y.o.   MRN: [id number]  HPI Here with her daughter asking for advice about potential surgery. She is scheduled for elective surgery on her right knee on 03-07-13 per Dr. Renaye Rakers. She has a hx of frequent UTIs and they had given her a course of Macrobid to take prior to the surgery. She has no UTI sx at this time. However when she woke up this morning she felt achy all over her body, she had a stiff neck and a HA, and she had a fever of 100 degrees. No ST or cough. No NVD. She has taken some Advil today and the fever has come down. She still feels achy and weak. Of note she had a flu shot 2 days ago at her nursing facility.    Review of Systems  Constitutional: Positive for fever, chills and fatigue.  Eyes: Negative.   Respiratory: Negative.   Cardiovascular: Negative.   Gastrointestinal: Negative.   Genitourinary: Negative.   Musculoskeletal: Positive for neck pain and neck stiffness. Negative for back pain.  Neurological: Positive for headaches.       Objective:   Physical Exam  Constitutional: She is oriented to person, place, and time. She appears well-developed and well-nourished. No distress.  In her wheelchair   Eyes: Conjunctivae and EOM are normal. Pupils are equal, round, and reactive to light.  Neck: Normal range of motion. Neck supple.  Cardiovascular: Normal rate, regular rhythm, normal heart sounds and intact distal pulses.   Pulmonary/Chest: Effort normal and breath sounds normal. No respiratory distress. She has no wheezes. She has no rales.  Abdominal: Soft. Bowel sounds are normal. She exhibits no distension and no mass. There is no tenderness. There is no rebound and no guarding.  Lymphadenopathy:    She has no cervical adenopathy.  Neurological: She is alert and oriented to person, place, and time.          Assessment & Plan:  She seems to be fine right now but we have no real idea about  where her symptoms have come from. I do not think it would be wise to proceed with an elective surgery in an elderly complicated patient who has many serious health issues, when she has had febrile symptoms today. We will call Dr. Greig Right office to cancel the surgery for now. She will take the Macrobid and drink plenty of fluids. Re-evaluate next week

## 2013-03-05 ENCOUNTER — Other Ambulatory Visit: Payer: Self-pay | Admitting: Cardiology

## 2013-03-06 MED ORDER — DEXTROSE-NACL 5-0.45 % IV SOLN: 100.0000 mL/h | INTRAVENOUS | Status: DC

## 2013-03-06 MED ORDER — ACETAMINOPHEN 500 MG PO TABS: 1000.0000 mg | ORAL_TABLET | Freq: Once | ORAL | Status: DC

## 2013-03-06 MED ORDER — CLINDAMYCIN PHOSPHATE 900 MG/50ML IV SOLN
900.0000 mg | INTRAVENOUS | Status: DC
  Filled 2013-03-06: qty 50

## 2013-03-07 ENCOUNTER — Ambulatory Visit (HOSPITAL_COMMUNITY): Admission: RE | Admit: 2013-03-07 | Payer: Medicare Other | Source: Ambulatory Visit | Admitting: Orthopedic Surgery

## 2013-03-07 ENCOUNTER — Encounter (HOSPITAL_COMMUNITY): Admission: RE | Payer: Self-pay | Source: Ambulatory Visit

## 2013-03-07 SURGERY — ARTHROSCOPY, KNEE
Anesthesia: General | Laterality: Right

## 2013-03-14 ENCOUNTER — Encounter (HOSPITAL_COMMUNITY): Payer: Self-pay | Admitting: Emergency Medicine

## 2013-03-14 ENCOUNTER — Other Ambulatory Visit: Payer: Self-pay

## 2013-03-14 ENCOUNTER — Encounter: Payer: Self-pay | Admitting: Family Medicine

## 2013-03-14 ENCOUNTER — Observation Stay (HOSPITAL_COMMUNITY): Payer: Medicare Other

## 2013-03-14 ENCOUNTER — Ambulatory Visit (INDEPENDENT_AMBULATORY_CARE_PROVIDER_SITE_OTHER): Payer: Medicare Other | Admitting: Family Medicine

## 2013-03-14 ENCOUNTER — Inpatient Hospital Stay (HOSPITAL_COMMUNITY)
Admission: EM | Admit: 2013-03-14 | Discharge: 2013-03-17 | DRG: 690 | Disposition: A | Discharge status: Home Health | Payer: Medicare Other | Attending: Internal Medicine | Admitting: Internal Medicine

## 2013-03-14 ENCOUNTER — Emergency Department (HOSPITAL_COMMUNITY): Payer: Medicare Other

## 2013-03-14 VITALS — BP 124/66 | HR 105 | Temp 98.0°F

## 2013-03-14 DIAGNOSIS — R059 Cough, unspecified: Secondary | ICD-10-CM | POA: Diagnosis not present

## 2013-03-14 DIAGNOSIS — C439 Malignant melanoma of skin, unspecified: Secondary | ICD-10-CM | POA: Diagnosis not present

## 2013-03-14 DIAGNOSIS — E860 Dehydration: Secondary | ICD-10-CM | POA: Diagnosis not present

## 2013-03-14 DIAGNOSIS — R509 Fever, unspecified: Secondary | ICD-10-CM

## 2013-03-14 DIAGNOSIS — Z853 Personal history of malignant neoplasm of breast: Secondary | ICD-10-CM

## 2013-03-14 DIAGNOSIS — I2510 Atherosclerotic heart disease of native coronary artery without angina pectoris: Secondary | ICD-10-CM

## 2013-03-14 DIAGNOSIS — R05 Cough: Secondary | ICD-10-CM | POA: Diagnosis not present

## 2013-03-14 DIAGNOSIS — K219 Gastro-esophageal reflux disease without esophagitis: Secondary | ICD-10-CM | POA: Diagnosis present

## 2013-03-14 DIAGNOSIS — N390 Urinary tract infection, site not specified: Principal | ICD-10-CM | POA: Diagnosis present

## 2013-03-14 DIAGNOSIS — J438 Other emphysema: Secondary | ICD-10-CM | POA: Diagnosis present

## 2013-03-14 DIAGNOSIS — F411 Generalized anxiety disorder: Secondary | ICD-10-CM

## 2013-03-14 DIAGNOSIS — J302 Other seasonal allergic rhinitis: Secondary | ICD-10-CM

## 2013-03-14 DIAGNOSIS — R5381 Other malaise: Secondary | ICD-10-CM | POA: Diagnosis not present

## 2013-03-14 DIAGNOSIS — Z87440 Personal history of urinary (tract) infections: Secondary | ICD-10-CM

## 2013-03-14 DIAGNOSIS — R531 Weakness: Secondary | ICD-10-CM

## 2013-03-14 DIAGNOSIS — I10 Essential (primary) hypertension: Secondary | ICD-10-CM | POA: Diagnosis not present

## 2013-03-14 DIAGNOSIS — J8410 Pulmonary fibrosis, unspecified: Secondary | ICD-10-CM | POA: Diagnosis not present

## 2013-03-14 DIAGNOSIS — E876 Hypokalemia: Secondary | ICD-10-CM

## 2013-03-14 DIAGNOSIS — C4359 Malignant melanoma of other part of trunk: Secondary | ICD-10-CM

## 2013-03-14 DIAGNOSIS — J439 Emphysema, unspecified: Secondary | ICD-10-CM

## 2013-03-14 DIAGNOSIS — Z880 Allergy status to penicillin: Secondary | ICD-10-CM

## 2013-03-14 DIAGNOSIS — C768 Malignant neoplasm of other specified ill-defined sites: Secondary | ICD-10-CM | POA: Diagnosis present

## 2013-03-14 DIAGNOSIS — A310 Pulmonary mycobacterial infection: Secondary | ICD-10-CM | POA: Diagnosis present

## 2013-03-14 HISTORY — DX: Other complications of anesthesia, initial encounter: T88.59XA

## 2013-03-14 HISTORY — DX: Adverse effect of unspecified anesthetic, initial encounter: T41.45XA

## 2013-03-14 LAB — CBC WITH DIFFERENTIAL/PLATELET
Basophils Absolute: 0 10*3/uL (ref 0.0–0.1)
Basophils Relative: 0 % (ref 0–1)
Eosinophils Absolute: 0.8 10*3/uL — ABNORMAL HIGH (ref 0.0–0.7)
HCT: 36.6 % (ref 36.0–46.0)
Hemoglobin: 12.7 g/dL (ref 12.0–15.0)
Lymphocytes Relative: 13 % (ref 12–46)
MCH: 28.6 pg (ref 26.0–34.0)
MCHC: 34.7 g/dL (ref 30.0–36.0)
MCV: 82.4 fL (ref 78.0–100.0)
Monocytes Absolute: 0.7 10*3/uL (ref 0.1–1.0)
Monocytes Relative: 8 % (ref 3–12)
Neutro Abs: 6.7 10*3/uL (ref 1.7–7.7)
Neutrophils Relative %: 71 % (ref 43–77)
RDW: 14.4 % (ref 11.5–15.5)
WBC: 9.5 10*3/uL (ref 4.0–10.5)

## 2013-03-14 LAB — COMPREHENSIVE METABOLIC PANEL
AST: 11 U/L (ref 0–37)
Albumin: 3.3 g/dL — ABNORMAL LOW (ref 3.5–5.2)
Alkaline Phosphatase: 53 U/L (ref 39–117)
BUN: 10 mg/dL (ref 6–23)
CO2: 28 mEq/L (ref 19–32)
Chloride: 95 mEq/L — ABNORMAL LOW (ref 96–112)
GFR calc Af Amer: 61 mL/min — ABNORMAL LOW (ref >90)
GFR calc non Af Amer: 52 mL/min — ABNORMAL LOW (ref >90)
Potassium: 3.2 mEq/L — ABNORMAL LOW (ref 3.5–5.1)
Total Bilirubin: 0.6 mg/dL (ref 0.3–1.2)

## 2013-03-14 LAB — POCT I-STAT TROPONIN I: Troponin i, poc: 0 ng/mL (ref 0.00–0.08)

## 2013-03-14 LAB — URINALYSIS W MICROSCOPIC + REFLEX CULTURE
Bilirubin Urine: NEGATIVE
Glucose, UA: NEGATIVE mg/dL
Hgb urine dipstick: NEGATIVE
Ketones, ur: NEGATIVE mg/dL
Nitrite: NEGATIVE
Protein, ur: NEGATIVE mg/dL
Specific Gravity, Urine: 1.02 (ref 1.005–1.030)
Urobilinogen, UA: 0.2 mg/dL (ref 0.0–1.0)

## 2013-03-14 MED ORDER — ONDANSETRON HCL 4 MG PO TABS: 4.0000 mg | ORAL_TABLET | Freq: Four times a day (QID) | ORAL | Status: DC | PRN

## 2013-03-14 MED ORDER — PANTOPRAZOLE SODIUM 40 MG PO TBEC
40.0000 mg | DELAYED_RELEASE_TABLET | Freq: Every day | ORAL | Status: DC
  Administered 2013-03-14 – 2013-03-17 (×4): 40 mg via ORAL
  Filled 2013-03-14 (×4): qty 1

## 2013-03-14 MED ORDER — HEPARIN SODIUM (PORCINE) 5000 UNIT/ML IJ SOLN
5000.0000 [IU] | Freq: Three times a day (TID) | INTRAMUSCULAR | Status: DC
  Administered 2013-03-14 – 2013-03-17 (×9): 5000 [IU] via SUBCUTANEOUS
  Filled 2013-03-14 (×11): qty 1

## 2013-03-14 MED ORDER — SODIUM CHLORIDE 0.9 % IV BOLUS (SEPSIS)
1000.0000 mL | Freq: Once | INTRAVENOUS | Status: AC
  Administered 2013-03-14: 1000 mL via INTRAVENOUS

## 2013-03-14 MED ORDER — POLYETHYLENE GLYCOL 3350 17 G PO PACK
17.0000 g | PACK | Freq: Every day | ORAL | Status: DC | PRN
  Filled 2013-03-14: qty 1

## 2013-03-14 MED ORDER — LOSARTAN POTASSIUM 50 MG PO TABS
50.0000 mg | ORAL_TABLET | Freq: Every day | ORAL | Status: DC
  Administered 2013-03-14 – 2013-03-17 (×4): 50 mg via ORAL
  Filled 2013-03-14 (×4): qty 1

## 2013-03-14 MED ORDER — DIAZEPAM 5 MG PO TABS
2.5000 mg | ORAL_TABLET | Freq: Every day | ORAL | Status: DC
  Administered 2013-03-14 – 2013-03-16 (×3): 2.5 mg via ORAL
  Filled 2013-03-14 (×3): qty 1

## 2013-03-14 MED ORDER — ONDANSETRON HCL 4 MG/2ML IJ SOLN: 4.0000 mg | Freq: Four times a day (QID) | INTRAMUSCULAR | Status: DC | PRN

## 2013-03-14 MED ORDER — SODIUM CHLORIDE 0.9 % IJ SOLN
3.0000 mL | Freq: Two times a day (BID) | INTRAMUSCULAR | Status: DC
  Administered 2013-03-15 – 2013-03-17 (×4): 3 mL via INTRAVENOUS

## 2013-03-14 MED ORDER — FLUTICASONE PROPIONATE 50 MCG/ACT NA SUSP
1.0000 | Freq: Every day | NASAL | Status: DC
  Administered 2013-03-14 – 2013-03-17 (×4): 1 via NASAL
  Filled 2013-03-14: qty 16

## 2013-03-14 MED ORDER — IOHEXOL 300 MG/ML  SOLN
80.0000 mL | Freq: Once | INTRAMUSCULAR | Status: AC | PRN
  Administered 2013-03-14: 22:00:00 80 mL via INTRAVENOUS

## 2013-03-14 MED ORDER — ACETAMINOPHEN 325 MG PO TABS: 650.0000 mg | ORAL_TABLET | Freq: Four times a day (QID) | ORAL | Status: DC | PRN

## 2013-03-14 MED ORDER — NITROGLYCERIN 0.4 MG SL SUBL: 0.4000 mg | SUBLINGUAL_TABLET | SUBLINGUAL | Status: DC | PRN

## 2013-03-14 MED ORDER — AMLODIPINE BESYLATE 2.5 MG PO TABS
2.5000 mg | ORAL_TABLET | Freq: Every day | ORAL | Status: DC
  Administered 2013-03-14 – 2013-03-17 (×4): 2.5 mg via ORAL
  Filled 2013-03-14 (×4): qty 1

## 2013-03-14 MED ORDER — ALBUTEROL SULFATE HFA 108 (90 BASE) MCG/ACT IN AERS
2.0000 | INHALATION_SPRAY | Freq: Four times a day (QID) | RESPIRATORY_TRACT | Status: DC | PRN
  Filled 2013-03-14: qty 6.7

## 2013-03-14 MED ORDER — SODIUM CHLORIDE 0.9 % IV SOLN
INTRAVENOUS | Status: DC
  Administered 2013-03-14 – 2013-03-16 (×3): via INTRAVENOUS

## 2013-03-14 MED ORDER — ASPIRIN 81 MG PO TABS
81.0000 mg | ORAL_TABLET | Freq: Every day | ORAL | Status: DC
  Administered 2013-03-14: 23:00:00 81 mg via ORAL
  Filled 2013-03-14 (×2): qty 1

## 2013-03-14 MED ORDER — ACETAMINOPHEN 650 MG RE SUPP: 650.0000 mg | Freq: Four times a day (QID) | RECTAL | Status: DC | PRN

## 2013-03-14 MED ORDER — POTASSIUM CHLORIDE CRYS ER 20 MEQ PO TBCR
40.0000 meq | EXTENDED_RELEASE_TABLET | Freq: Once | ORAL | Status: AC
  Administered 2013-03-15: 40 meq via ORAL
  Filled 2013-03-14: qty 2

## 2013-03-14 NOTE — H&P (Signed)
[facility] History and Physical  [name] [name] ZOX:[id number] DOB: 1924/02/16 DOA: 03/14/2013  Referring physician: Dr. Freida Busman PCP: Judie Petit, MD    Chief Complaint: fever and weakness  HPI: [name] [name] is a 79 y.o. female with past medical history of recurrent UTIs, COPD, lung nodules, CAD who presents today with concern about fever and weakness which have been occuring intermittently for about 10 days.  She presented to her PCP office today and was advised to come to the ED for further evaluation.  She states that fevers have been up to 102, most recently to 100 today.  She has been taking macrobid for a UTI for the past 7 days, this was prescribed by her orthopedist for presumed UTI.  She says that when she started taking the macrobid the fever seemed to get better, but recurred yesterday.  She denies current dysuria or flank pain.  She has a chronic runny nose and nasal congestion which is unchanged.  She has not had a cough, shortness of breath or chest pain.  She does not have any skin lesions or infections.  She has not had any nausea or vomiting or diarrhea.  She states that her appetite has been poor for several weeks.  She has been so tired she has not wanted to walk down the hall to meal times.  Review of Systems:  GEN: weakness, malaise and fatigue, recent weight loss of 5lbs, fever HEENT: no head ache or neck stiffness, no new congestion, throat pain, ear pain,  CV: no chest pain, palpitations, syncope, edema PULM: no shortness of breath, cough, wheezing GI as above no melena, hematochezia GU as above no current dysuria or frequency SKIN no rash or lesion   Past Medical History  Diagnosis Date  . ANXIETY 08/16/2008  . CAD, UNSPECIFIED SITE 10/17/2008  . CHOLELITHIASIS 08/23/2008  . COPD 01/04/2010    FeV1 101%, DLCO64% 2008  . 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 DIVERTICULOSIS-COLON 04/07/2008  . GERD 04/19/2007  . HYPERSOMNIA, ASSOCIATED WITH SLEEP APNEA  02/01/2007  . HYPERTENSION 11/12/2006  . MITRAL VALVE PROLAPSE, HX OF 10/07/2008  . NEOPLASM, MALIGNANT, BREAST, RIGHT 02/15/2008  . OSTEOPOROSIS 04/04/2008  . Raynaud's syndrome 04/25/2009  . Airway obstruction   . History of diverticulitis of colon   . Tortuous colon   . Anal stricture   . Chronic constipation   . Fatty liver   . Hiatal hernia   . Esophageal stricture   . IBS (irritable bowel syndrome)   . Pulmonary fibrosis   . Sleep apnea   . COPD (chronic obstructive pulmonary disease)   . Colitis     sigmoid  . Candida esophagitis   . Diverticulitis   . Hearing aid worn     bilateral  . Atrial fibrillation     she denies known hx of afib 02/24/13  . Complication of anesthesia     difficult intubation   Past Surgical History  Procedure Laterality Date  . Abdominal hysterectomy    . Bilateral salpingoophorectomy      s/p prolapsed bladder  . Bladder surgery      prolapsed  . Thoracotomy      granuloma, pulmonary-thoracotomy  . Breast surgery  07-21-07    mastectomy (right), all margins neg and LNs neg   . Appendectomy    . Tonsillectomy    . Rotator cuff repair    . Mastectomy      right  . Breast cyst excision  left  . Mouth surgery    . Eye surgery      cataract removal bilateral  . Melanoma excision      rt clavicle Dr Hettie Holstein office)  . Lung surgery     Social History:  reports that she quit smoking about 29 years ago. Her smoking use included Cigarettes. She has a 60 pack-year smoking history. She has never used smokeless tobacco. She reports that she drinks alcohol. She reports that she does not use illicit drugs. She lives in an assisted living facility.  Daughter Olegario Messier is present and very supportive.  Uses rolling walker for ambulation   Allergies  Allergen Reactions  . Ciprofloxacin Swelling  . Penicillins     REACTION: hives, has had mild doses that she did not have reactions to  . Prednisone   . Sulfamethoxazole     REACTION:  Nausea/vomiting    Family History  Problem Relation Age of Onset  . Parkinsonism Father   . Coronary artery disease Father   . Breast cancer      aunt  . Colon cancer      aunt, age 46  . Uterine cancer      aunt  . Stroke Mother   . Heart failure Mother   . Cancer Maternal Aunt     breast    Prior to Admission medications   Medication Sig Start Date End Date Taking? Authorizing Provider  albuterol (PROVENTIL HFA;VENTOLIN HFA) 108 (90 BASE) MCG/ACT inhaler Inhale 2 puffs into the lungs every 6 (six) hours as needed for wheezing.   Yes Historical Provider, MD  amLODipine (NORVASC) 2.5 MG tablet Take 2.5 mg by mouth daily.   Yes Historical Provider, MD  aspirin 81 MG tablet Take 81 mg by mouth daily.   Yes Historical Provider, MD  diazepam (VALIUM) 5 MG tablet Take 2.5 mg by mouth at bedtime.    Yes Historical Provider, MD  losartan (COZAAR) 50 MG tablet Take 50 mg by mouth daily.   Yes Historical Provider, MD  mometasone (NASONEX) 50 MCG/ACT nasal spray Place 1 spray into the nose 2 (two) times daily. 09/17/12  Yes Storm Frisk, MD  nitroGLYCERIN (NITROSTAT) 0.4 MG SL tablet Place 0.4 mg under the tongue every 5 (five) minutes as needed for chest pain.   Yes Historical Provider, MD  omeprazole (PRILOSEC) 20 MG capsule Take 20 mg by mouth daily.   Yes Historical Provider, MD  polyethylene glycol (MIRALAX / GLYCOLAX) packet Take 17 g by mouth daily as needed (for constipation).   Yes Historical Provider, MD  AMBULATORY NON FORMULARY MEDICATION Oxygen at bedtime-2 1/2 liters    Historical Provider, MD  Spacer/Aero-Holding Chambers (AEROCHAMBER MV) inhaler Use as instructed 11/18/12   Leslye Peer, MD   Physical Exam: Filed Vitals:   03/14/13 1855  BP:   Pulse:   Temp: 100 F (37.8 C)  Resp:      General:  No distress, calm  Eyes: PERRLA, ROMI, conjunctive clear  ENT: MMdry, oropharynx clear, neck supple, no LAD  Cardiovascular: irreg, distant, no mrg  Respiratory:  shallow resps, poor effort, clear  Abdomen: soft, nontender, non distended, normal bowel sounds  Skin: no rash or skin lesions noted  Musculoskeletal: no tender or swollen joints  Psychiatric: appropriate  Neurologic: CN2-12 intact, no focal defect   Labs on Admission:  Basic Metabolic Panel:  Recent Labs Lab 03/14/13 1600  NA 135  K 3.2*  CL 95*  CO2 28  GLUCOSE 126*  BUN 10  CREATININE 0.95  CALCIUM 9.5   Liver Function Tests:  Recent Labs Lab 03/14/13 1600  AST 11  ALT 7  ALKPHOS 53  BILITOT 0.6  PROT 6.7  ALBUMIN 3.3*   No results found for this basename: LIPASE, AMYLASE,  in the last 168 hours No results found for this basename: AMMONIA,  in the last 168 hours CBC:  Recent Labs Lab 03/14/13 1600  WBC 9.5  NEUTROABS 6.7  HGB 12.7  HCT 36.6  MCV 82.4  PLT 332   Cardiac Enzymes: No results found for this basename: CKTOTAL, CKMB, CKMBINDEX, TROPONINI,  in the last 168 hours  BNP (last 3 results) No results found for this basename: PROBNP,  in the last 8760 hours CBG: No results found for this basename: GLUCAP,  in the last 168 hours  Radiological Exams on Admission: Dg Chest 2 View  03/14/2013   CLINICAL DATA:  Cough and fever.  EXAM: CHEST  2 VIEW  COMPARISON:  05/19/2012.  FINDINGS: Stable scarring changes in the left long along with postoperative changes. No new/acute pulmonary findings. The heart is borderline enlarged but stable. Stable tortuosity and calcification of the thoracic aorta.  IMPRESSION: Chronic scarring changes in the left long and stable postoperative changes.  No acute pulmonary findings.   Electronically Signed   By: Loralie Champagne M.D.   On: 03/14/2013 15:31    Assessment/Plan  1)  Fever of unknown origin: No specific symptoms to reveal source of fever.  Differential includes infection, malignancy, PE. UA today is normal, does not seem to have persistent UTI on macrobid.  Blood cultures obtained.  No elevation in WBC's. CXR  does not show active process.  She does have history of lung nodules and granulomatous disease.  Will check CT of chest to evaluate for possible nodule progression.  She does not have chest pain, shortness of breath, tachycardia, LE tenderness or any other suggestion of PE. Does have history of breast cancer and also melanoma.    2) generalized weakness: due to fever and decreased PO intake.  PT consult.  Encourage PO intake.  Does not apear dehydrated.  3) HTN: controled. Continue amlodipine and cozaar  4) COPD: controled. Continue 02 at night.  Continue albuterol prn  5) GERD: PPI  6) hypokalemia: repleat   Code Status: full Family Communication: spoke with patient and daughter at bedside   Time spent: 60 minutes  [facility] Pager [phone]  If 7PM-7AM, please contact night-coverage [email or web address] Password [facility] 03/14/2013, 7:48 PM

## 2013-03-14 NOTE — Progress Notes (Signed)
  Subjective:    Patient ID: [name] [name], female    DOB: 06-Apr-1924, 79 y.o.   MRN: [id number]  HPI Here with her daughter for continued fevers, weakness, mild SOB, and mild body aches. The fever has been as high as 102 degrees, and she has been ill for about 2 weeks now. She was seen here on 03-04-13 for some fever, aches, and HA but her exam was unremarkable. She had been on Macrobid for a presumed UTI, although she denies any urgency or burning. Since that day she has continued to have fevers, her appetite is poor, and she is very thirsty. She still denies any coughing but she has had some left upper chest pains off and on. No NVD.    Review of Systems  Constitutional: Positive for fever and fatigue.  HENT: Negative.   Eyes: Negative.   Respiratory: Positive for chest tightness. Negative for cough and wheezing.   Cardiovascular: Positive for chest pain. Negative for palpitations and leg swelling.  Gastrointestinal: Negative.   Genitourinary: Negative.   Neurological: Negative.        Objective:   Physical Exam  Constitutional:  In her sitting walker, alert but appears frail and weak   HENT:  Right Ear: External ear normal.  Left Ear: External ear normal.  Nose: Nose normal.  Mouth/Throat: Oropharynx is clear and moist.  Eyes: Conjunctivae are normal. Pupils are equal, round, and reactive to light.  Neck: Normal range of motion. Neck supple.  Cardiovascular: Normal rate, normal heart sounds and intact distal pulses.   No murmur heard. Occasional ectopy   Pulmonary/Chest: Effort normal. No respiratory distress. She has no rales.  Scattered rhonchi and wheezes   Abdominal: Soft. Bowel sounds are normal. She exhibits no distension and no mass. There is no tenderness. There is no rebound and no guarding.  Lymphadenopathy:    She has no cervical adenopathy.          Assessment & Plan:  This is a frail elderly patient with 2 weeks of intermittent fevers who is weak and somewhat  dehydrated. Possible etiologies include pneumonia or urosepsis. I think she needs to be admitted for a detailed workup and for IV fluids. Her daughter will drive her from here to [facility] ER for further evaluation.

## 2013-03-14 NOTE — ED Provider Notes (Signed)
Medical screening examination/treatment/procedure(s) were conducted as a shared visit with non-physician practitioner(s) and myself.  I personally evaluated the patient during the encounter.  EKG Interpretation   None       Date: 03/14/2013  Rate: 79  Rhythm: normal sinus rhythm  QRS Axis: left  Intervals: normal  ST/T Wave abnormalities: normal  Conduction Disutrbances:supraventricular bigemeny  Narrative Interpretation:   Old EKG Reviewed: none available    Toy Baker, MD 03/19/13 [phone]

## 2013-03-14 NOTE — ED Provider Notes (Signed)
Medical screening examination/treatment/procedure(s) were performed by non-physician practitioner and as supervising physician I was immediately available for consultation/collaboration.  EKG Interpretation     Ventricular Rate:    PR Interval:    QRS Duration:   QT Interval:    QTC Calculation:   R Axis:     Text Interpretation:               Toy Baker, MD 03/14/13 2313

## 2013-03-14 NOTE — ED Provider Notes (Signed)
CSN: [id number]     Arrival date & time 03/14/13  1338 History   First MD Initiated Contact with Patient 03/14/13 1407     No chief complaint on file.  (Consider location/radiation/quality/duration/timing/severity/associated sxs/prior Treatment) HPI [name] [name] is a 79 y.o. female who presents to emergency department complaining of weakness and fevers intermittently for the last 10 days. Patient was sent here from her primary care doctor's office, Dr. Clent Ridges, who was concerned that patient may be dehydrated and septic. Patient states that she has had fever up to 102 for the last 10 days. States they 108 about 3 days ago but returned last night. States that she has been taking Tylenol for her fever, did not take any today. She also had some nasal congestion and slight cough which is also improved now. She was also is placed on antibiotic by her orthopedic surgeon approximately a week ago. States she has one more tablet left of this medication. She does not remember the name of the antibiotic. She denies any current upper respiratory symptoms, cough, abdominal pain, urinary symptoms, nausea vomiting. She states that she did have an episode of diarrhea here in the hospital. She states the reason she went back to her doctor today is because she is feeling increased weakness daily. States unable to function normally.  Past Medical History  Diagnosis Date  . ANXIETY 08/16/2008  . CAD, UNSPECIFIED SITE 10/17/2008  . CHOLELITHIASIS 08/23/2008  . COPD 01/04/2010    FeV1 101%, DLCO64% 2008  . 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 DIVERTICULOSIS-COLON 04/07/2008  . GERD 04/19/2007  . HYPERSOMNIA, ASSOCIATED WITH SLEEP APNEA 02/01/2007  . HYPERTENSION 11/12/2006  . MITRAL VALVE PROLAPSE, HX OF 10/07/2008  . NEOPLASM, MALIGNANT, BREAST, RIGHT 02/15/2008  . OSTEOPOROSIS 04/04/2008  . Raynaud's syndrome 04/25/2009  . Airway obstruction   . History of diverticulitis of colon   . Tortuous colon   . Anal  stricture   . Chronic constipation   . Fatty liver   . Hiatal hernia   . Esophageal stricture   . IBS (irritable bowel syndrome)   . Pulmonary fibrosis   . Sleep apnea   . COPD (chronic obstructive pulmonary disease)   . Colitis     sigmoid  . Candida esophagitis   . Diverticulitis   . Hearing aid worn     bilateral  . Atrial fibrillation     she denies known hx of afib 02/24/13   Past Surgical History  Procedure Laterality Date  . Abdominal hysterectomy    . Bilateral salpingoophorectomy      s/p prolapsed bladder  . Bladder surgery      prolapsed  . Thoracotomy      granuloma, pulmonary-thoracotomy  . Breast surgery  07-21-07    mastectomy (right), all margins neg and LNs neg   . Appendectomy    . Tonsillectomy    . Rotator cuff repair    . Mastectomy      right  . Breast cyst excision      left  . Mouth surgery    . Eye surgery      cataract removal bilateral  . Melanoma excision      rt clavicle Dr Hettie Holstein office)  . Lung surgery     Family History  Problem Relation Age of Onset  . Parkinsonism Father   . Coronary artery disease Father   . Breast cancer      aunt  . Colon cancer  aunt, age 44  . Uterine cancer      aunt  . Stroke Mother   . Heart failure Mother   . Cancer Maternal Aunt     breast   History  Substance Use Topics  . Smoking status: Former Smoker -- 2.00 packs/day for 30 years    Types: Cigarettes    Quit date: 05/13/1983  . Smokeless tobacco: Never Used  . Alcohol Use: Yes     Comment: 1-2 glaases wine per day   OB History   Grav Para Term Preterm Abortions TAB SAB Ect Mult Living                 Review of Systems  Constitutional: Positive for fever, chills and fatigue.  Respiratory: Negative for cough, chest tightness and shortness of breath.   Cardiovascular: Negative for chest pain, palpitations and leg swelling.  Gastrointestinal: Negative for nausea, vomiting, abdominal pain and diarrhea.  Genitourinary:  Negative for dysuria, flank pain, vaginal bleeding, vaginal discharge, vaginal pain and pelvic pain.  Musculoskeletal: Negative for arthralgias, myalgias, neck pain and neck stiffness.  Skin: Negative for rash.  Neurological: Positive for weakness. Negative for dizziness and headaches.  All other systems reviewed and are negative.    Allergies  Ciprofloxacin; Penicillins; Prednisone; and Sulfamethoxazole  Home Medications   Current Outpatient Rx  Name  Route  Sig  Dispense  Refill  . albuterol (PROVENTIL HFA;VENTOLIN HFA) 108 (90 BASE) MCG/ACT inhaler   Inhalation   Inhale 2 puffs into the lungs every 6 (six) hours as needed for wheezing.         Marland Kitchen amLODipine (NORVASC) 2.5 MG tablet   Oral   Take 2.5 mg by mouth daily.         Marland Kitchen aspirin 81 MG tablet   Oral   Take 81 mg by mouth daily.         . diazepam (VALIUM) 5 MG tablet   Oral   Take 2.5 mg by mouth at bedtime.          Marland Kitchen losartan (COZAAR) 50 MG tablet   Oral   Take 50 mg by mouth daily.         . mometasone (NASONEX) 50 MCG/ACT nasal spray   Nasal   Place 1 spray into the nose 2 (two) times daily.         . nitroGLYCERIN (NITROSTAT) 0.4 MG SL tablet   Sublingual   Place 0.4 mg under the tongue every 5 (five) minutes as needed for chest pain.         Marland Kitchen omeprazole (PRILOSEC) 20 MG capsule   Oral   Take 20 mg by mouth daily.         . polyethylene glycol (MIRALAX / GLYCOLAX) packet   Oral   Take 17 g by mouth daily as needed (for constipation).         . AMBULATORY NON FORMULARY MEDICATION      Oxygen at bedtime-2 1/2 liters         . Spacer/Aero-Holding Chambers (AEROCHAMBER MV) inhaler      Use as instructed   1 each   0    BP 109/60  Pulse 57  Temp(Src) 97.5 F (36.4 C) (Oral)  Resp 18  SpO2 96% Physical Exam  Nursing note and vitals reviewed. Constitutional: She is oriented to person, place, and time. She appears well-developed and well-nourished. No distress.  HENT:   Head: Normocephalic.  Nose: Nose normal.  Oral mucosa  dry, otherwise oropharynx normal  Eyes: Conjunctivae are normal.  Neck: Normal range of motion. Neck supple.  No meningismus  Cardiovascular: Regular rhythm, normal heart sounds and intact distal pulses.   No murmur heard. tachycardic  Pulmonary/Chest: Effort normal. No respiratory distress. She has wheezes. She has rales.  Decreased air movement at right base, slight expiratory wheezes and rales  Abdominal: Soft. Bowel sounds are normal. She exhibits no distension. There is no tenderness. There is no rebound and no guarding.  Musculoskeletal: She exhibits no edema.  Neurological: She is alert and oriented to person, place, and time.  Skin: Skin is warm and dry.    ED Course  Procedures (including critical care time) Labs Review Labs Reviewed  CBC WITH DIFFERENTIAL - Abnormal; Notable for the following:    Eosinophils Relative 9 (*)    Eosinophils Absolute 0.8 (*)    All other components within normal limits  COMPREHENSIVE METABOLIC PANEL - Abnormal; Notable for the following:    Potassium 3.2 (*)    Chloride 95 (*)    Glucose, Bld 126 (*)    Albumin 3.3 (*)    GFR calc non Af Amer 52 (*)    GFR calc Af Amer 61 (*)    All other components within normal limits  URINALYSIS W MICROSCOPIC + REFLEX CULTURE - Abnormal; Notable for the following:    Squamous Epithelial / LPF FEW (*)    All other components within normal limits  CG4 I-STAT (LACTIC ACID) - Abnormal; Notable for the following:    Lactic Acid, Venous 2.29 (*)    All other components within normal limits  CULTURE, BLOOD (ROUTINE X 2)  CULTURE, BLOOD (ROUTINE X 2)  POCT I-STAT TROPONIN I   Imaging Review Dg Chest 2 View  03/14/2013   CLINICAL DATA:  Cough and fever.  EXAM: CHEST  2 VIEW  COMPARISON:  05/19/2012.  FINDINGS: Stable scarring changes in the left long along with postoperative changes. No new/acute pulmonary findings. The heart is borderline enlarged  but stable. Stable tortuosity and calcification of the thoracic aorta.  IMPRESSION: Chronic scarring changes in the left long and stable postoperative changes.  No acute pulmonary findings.   Electronically Signed   By: Loralie Champagne M.D.   On: 03/14/2013 15:31    EKG Interpretation   None       MDM   1. Weakness   2. Fever   3. Dehydration     Patient with intermittent fever for last 10 days, was seen by primary care doctor today. Since then progressive weakness, night sweats, poor appetite. She was sent here for possible dehydration and sepsis. Initial presentation showed temperature of 98, heart rate 105, blood pressure is normal. I gave her IV normal saline emergency department for dehydration. A workup here did not show any major abnormalities except for lactic acid of 2.29, potassium 3.2. I do not think patient is septic at this time. Her UA is clear. Her chest x-ray did not show any signs of infection, to show chronic scarring in surgical changes. Patient's rectal temp is 100. Given her high fevers, prolonged illness, weakness, will admit for out. No device given this time given no sores. She has no signs of meningismus do not think she needs any further imaging at this time.  Filed Vitals:   03/14/13 1819 03/14/13 1826 03/14/13 1829 03/14/13 1855  BP: 138/58 148/58 136/53   Pulse: 73 81 88   Temp:    100 F (37.8 C)  TempSrc:    Rectal  Resp:      SpO2:         Lottie Mussel, PA-C 03/14/13 2105

## 2013-03-14 NOTE — ED Notes (Signed)
Pt was sent from her PCP to be evaluated for pneumonia. Pt was seen by her PCP 10 days ago and was told she had viral infection; reports fever up to 103, night sweats, poor appetite as well as chest discomfort. Pt has hx of COPD. Denies cough.

## 2013-03-15 ENCOUNTER — Inpatient Hospital Stay (HOSPITAL_COMMUNITY): Payer: Medicare Other

## 2013-03-15 ENCOUNTER — Encounter (HOSPITAL_COMMUNITY): Payer: Self-pay | Admitting: Radiology

## 2013-03-15 DIAGNOSIS — J309 Allergic rhinitis, unspecified: Secondary | ICD-10-CM

## 2013-03-15 DIAGNOSIS — R0602 Shortness of breath: Secondary | ICD-10-CM | POA: Diagnosis not present

## 2013-03-15 DIAGNOSIS — R509 Fever, unspecified: Secondary | ICD-10-CM | POA: Diagnosis not present

## 2013-03-15 DIAGNOSIS — R5381 Other malaise: Secondary | ICD-10-CM | POA: Diagnosis not present

## 2013-03-15 DIAGNOSIS — C439 Malignant melanoma of skin, unspecified: Secondary | ICD-10-CM | POA: Diagnosis present

## 2013-03-15 DIAGNOSIS — J8410 Pulmonary fibrosis, unspecified: Secondary | ICD-10-CM | POA: Diagnosis present

## 2013-03-15 DIAGNOSIS — J438 Other emphysema: Secondary | ICD-10-CM | POA: Diagnosis not present

## 2013-03-15 DIAGNOSIS — I2510 Atherosclerotic heart disease of native coronary artery without angina pectoris: Secondary | ICD-10-CM | POA: Diagnosis not present

## 2013-03-15 DIAGNOSIS — E876 Hypokalemia: Secondary | ICD-10-CM | POA: Diagnosis present

## 2013-03-15 DIAGNOSIS — Z880 Allergy status to penicillin: Secondary | ICD-10-CM | POA: Diagnosis not present

## 2013-03-15 DIAGNOSIS — R05 Cough: Secondary | ICD-10-CM | POA: Diagnosis not present

## 2013-03-15 DIAGNOSIS — I10 Essential (primary) hypertension: Secondary | ICD-10-CM | POA: Diagnosis present

## 2013-03-15 DIAGNOSIS — K219 Gastro-esophageal reflux disease without esophagitis: Secondary | ICD-10-CM | POA: Diagnosis present

## 2013-03-15 DIAGNOSIS — F411 Generalized anxiety disorder: Secondary | ICD-10-CM

## 2013-03-15 DIAGNOSIS — Z853 Personal history of malignant neoplasm of breast: Secondary | ICD-10-CM | POA: Diagnosis not present

## 2013-03-15 DIAGNOSIS — N390 Urinary tract infection, site not specified: Secondary | ICD-10-CM | POA: Diagnosis present

## 2013-03-15 DIAGNOSIS — C768 Malignant neoplasm of other specified ill-defined sites: Secondary | ICD-10-CM | POA: Diagnosis present

## 2013-03-15 DIAGNOSIS — Z87440 Personal history of urinary (tract) infections: Secondary | ICD-10-CM | POA: Diagnosis not present

## 2013-03-15 LAB — BASIC METABOLIC PANEL
BUN: 7 mg/dL (ref 6–23)
CO2: 25 mEq/L (ref 19–32)
Calcium: 8.3 mg/dL — ABNORMAL LOW (ref 8.4–10.5)
Chloride: 101 mEq/L (ref 96–112)
Creatinine, Ser: 0.78 mg/dL (ref 0.50–1.10)
Glucose, Bld: 119 mg/dL — ABNORMAL HIGH (ref 70–99)
Potassium: 3.5 mEq/L (ref 3.5–5.1)
Sodium: 137 mEq/L (ref 135–145)

## 2013-03-15 LAB — CBC
HCT: 33.6 % — ABNORMAL LOW (ref 36.0–46.0)
Hemoglobin: 11.5 g/dL — ABNORMAL LOW (ref 12.0–15.0)
MCH: 28.2 pg (ref 26.0–34.0)
MCV: 82.4 fL (ref 78.0–100.0)
Platelets: 286 10*3/uL (ref 150–400)
RBC: 4.08 MIL/uL (ref 3.87–5.11)
RDW: 14.5 % (ref 11.5–15.5)
WBC: 7.9 10*3/uL (ref 4.0–10.5)

## 2013-03-15 LAB — HIV ANTIBODY (ROUTINE TESTING W REFLEX): HIV: NONREACTIVE

## 2013-03-15 MED ORDER — IOHEXOL 300 MG/ML  SOLN
80.0000 mL | Freq: Once | INTRAMUSCULAR | Status: AC | PRN
  Administered 2013-03-15: 80 mL via INTRAVENOUS

## 2013-03-15 MED ORDER — VANCOMYCIN HCL IN DEXTROSE 750-5 MG/150ML-% IV SOLN
750.0000 mg | INTRAVENOUS | Status: AC
  Administered 2013-03-15: 750 mg via INTRAVENOUS
  Filled 2013-03-15: qty 150

## 2013-03-15 MED ORDER — ASPIRIN EC 81 MG PO TBEC
81.0000 mg | DELAYED_RELEASE_TABLET | Freq: Every day | ORAL | Status: DC
  Administered 2013-03-15 – 2013-03-17 (×3): 81 mg via ORAL
  Filled 2013-03-15 (×4): qty 1

## 2013-03-15 MED ORDER — DEXTROSE 5 % IV SOLN
1.0000 g | INTRAVENOUS | Status: DC
  Administered 2013-03-15 – 2013-03-17 (×3): 1 g via INTRAVENOUS
  Filled 2013-03-15 (×3): qty 1

## 2013-03-15 MED ORDER — IOHEXOL 300 MG/ML  SOLN
25.0000 mL | INTRAMUSCULAR | Status: AC
  Administered 2013-03-15 (×2): 25 mL via ORAL

## 2013-03-15 MED ORDER — VANCOMYCIN HCL 500 MG IV SOLR
500.0000 mg | Freq: Two times a day (BID) | INTRAVENOUS | Status: DC
  Administered 2013-03-15 – 2013-03-17 (×4): 500 mg via INTRAVENOUS
  Filled 2013-03-15 (×5): qty 500

## 2013-03-15 NOTE — Progress Notes (Addendum)
[facility] PROGRESS NOTE  [name] [name] ZOX:[id number] DOB: [birth year] DOA: 03/14/2013 PCP: Judie Petit, MD  Brief narrative: 79 y.o. female with past medical history of recurrent UTIs, COPD, lung nodules, CAD who presented to [facility] ED with main concern of persistent and intermittent episodes of fever and generalized weakness that initially started 10 days prior to this admission (PTA), with Tmax 102 F one day PTA. Her PCP advised her to go to ED. Pt explained she has been taking Macrobid for UTI for the past 7 days. She explained that fevers seemed less frequent when she initially started to take Macrobid. Pt denies any specific urinary or abdominal concerns such as pain, dysuria, hematuria, urinary urgency and frequency.   Assessment/Plan:  Principal Problem:   Fever of unknown origin - no clear source evident at this time - CXR, UA unrevealing and no WBC elevation on CBC noted  - CT chest with mild interstitial edema but no clear cardiopulmonary source identified  - Tmax over 24 hours 100 F - pt does have large granuloma as noted on the chest CT below but this is chronic in etiology - since pt has history of breat cancer, melanoma of the chest, malignancy is also in differential as is PE, other infectious process  - blood cultures and urine culture pending (please note that urine culture was not ordered on admission so pt already received 24 hours of ABX prior to urine culture)  - will proceed with CT abd/pelvis, HIV test  - consider ID consult if no clear source identified on the above studies  Active Problems:   HYPERTENSION - reasonable inpatient control - continue Cozaar and Amlodipine    CAD, UNSPECIFIED SITE - continue aspirin    COPD with emphysema, gold stage A - clinically compensated - pt maintaining oxygen saturations at target range    GERD - continue PPI   History of breast cancer, T1b, N0, Lumpectomy 07/21/2007 - per pt this has been stale with no recurrent  cancer    Melanoma of trunk, right chest   Hypokalemia - supplemented and within normal limits this AM   Code Status: full code Family Communication: no family at the bedside Disposition Plan: Remains inpatient   Manson Passey, MD  [facility] Pager [phone]  If 7PM-7AM, please contact night-coverage [email or web address] Password [email or web address] 03/15/2013, 6:36 AM   LOS: 1 day   Consultants:  None  Other consultants:  PT evaluation  Procedures/Studies: CXR 03/14/2013 Chronic scarring changes in the left long and stable postoperative changes.  No acute pulmonary findings.     Ct Chest W Contrast  03/14/2013    1. The appearance of the lungs suggests mild interstitial edema. Strictly speaking superimposed infection such as bronchitis is not excluded, but is not strongly favored at this time. 2. Sequela of old granulomatous disease with large LUL calcified granuloma, chronic scarring redemonstrated. There is likely focus round atelectasis in LLL.  3. Mild cardiomegaly.  4. Atherosclerosis, including three-vessel coronary artery disease.  5. Mild centrilobular emphysema.  Antibiotics:  Vancomycin11/08/2012 -->  Maxipime 03/15/2013 -->  HPI/Subjective: No events overnight. Pt denies chest pain or shortness of breath.   Objective: Filed Vitals:   03/14/13 1829 03/14/13 1855 03/14/13 2118 03/15/13 0553  BP: 136/53  140/74 127/59  Pulse: 88  73 66  Temp:  100 F (37.8 C) 99.1 F (37.3 C) 98 F (36.7 C)  TempSrc:  Rectal Oral Oral  Resp:   18 18  Height:   5\' 1"  (1.549 m)  Weight:   58.5 kg (128 lb 15.5 oz)   SpO2:   92% 97%    Intake/Output Summary (Last 24 hours) at 03/15/13 0636 Last data filed at 03/15/13 0600  Gross per 24 hour  Intake  867.5 ml  Output    350 ml  Net  517.5 ml    Exam:   General:  Pt is alert, follows commands appropriately, not in acute distress  Cardiovascular: Regular rate and rhythm, S1/S2, no murmurs, no rubs, no gallops  Respiratory:  Clear to auscultation bilaterally, no wheezing, no crackles, no rhonchi  Abdomen: Soft, non tender, non distended, bowel sounds present, no guarding  Extremities: No edema, pulses DP and PT palpable bilaterally  Data Reviewed: Basic Metabolic Panel:  Recent Labs Lab 03/14/13 1600 03/15/13 0518  NA 135 137  K 3.2* 3.5  CL 95* 101  CO2 28 25  GLUCOSE 126* 119*  BUN 10 7  CREATININE 0.95 0.78  CALCIUM 9.5 8.3*   Liver Function Tests:  Recent Labs Lab 03/14/13 1600  AST 11  ALT 7  ALKPHOS 53  BILITOT 0.6  PROT 6.7  ALBUMIN 3.3*   CBC:  Recent Labs Lab 03/14/13 1600 03/15/13 0518  WBC 9.5 7.9  NEUTROABS 6.7  --   HGB 12.7 11.5*  HCT 36.6 33.6*  MCV 82.4 82.4  PLT 332 286    Scheduled Meds: . amLODipine  2.5 mg Oral Daily  . aspirin  81 mg Oral Daily  . diazepam  2.5 mg Oral QHS  . fluticasone  1 spray Each Nare Daily  . heparin  5,000 Units Subcutaneous Q8H  . losartan  50 mg Oral Daily  . pantoprazole  40 mg Oral Daily   Continuous Infusions: . sodium chloride 75 mL/hr at 03/14/13 2314

## 2013-03-15 NOTE — Progress Notes (Signed)
Nutrition Brief Note  Patient identified on the Malnutrition Screening Tool (MST) Report  Wt Readings from Last 15 Encounters:  03/14/13 128 lb 15.5 oz (58.5 kg)  03/04/13 130 lb (58.968 kg)  02/24/13 132 lb 1 oz (59.903 kg)  01/04/13 129 lb 3.2 oz (58.605 kg)  11/22/12 130 lb (58.968 kg)  11/18/12 131 lb (59.421 kg)  11/18/12 128 lb (58.06 kg)  10/11/12 136 lb (61.689 kg)  09/21/12 137 lb (62.143 kg)  09/11/12 133 lb (60.328 kg)  07/19/12 132 lb (59.875 kg)  06/18/12 132 lb (59.875 kg)  06/09/12 133 lb 9.6 oz (60.601 kg)  05/20/12 130 lb (58.968 kg)  05/19/12 132 lb (59.875 kg)    Body mass index is 24.38 kg/(m^2). Patient meets criteria for Normal Weight based on current BMI. Pt states she usually weighs between 125-130 lbs, usually has a great appetite, and eats/cooks 3 meals daily. Pt states that for the past few days she didn't feel like eating due to flu-like symptoms but, she feel better today and her appetite has improved.   Current diet order is Heart Healthy, patient is consuming approximately 75-80% of meals at this time. Labs and medications reviewed.   No nutrition interventions warranted at this time. If nutrition issues arise, please consult RD.   Ian Malkin RD, LDN Inpatient Clinical Dietitian Pager: [phone] After Hours Pager: [phone]

## 2013-03-15 NOTE — Evaluation (Signed)
Physical Therapy Evaluation Patient Details Name: [name] [name] MRN: [id number] DOB: 01-Oct-1923 Today's Date: 03/15/2013 Time: 4098-1191 PT Time Calculation (min): 31 min  PT Assessment / Plan / Recommendation History of Present Illness  [name] [name] is a 79 y.o. female with past medical history of recurrent UTIs, COPD, lung nodules, CAD who presents today with concern about fever and weakness which have been occuring intermittently for about 10 days.  She presented to her PCP office today and was advised to come to the ED for further evaluation.  She states that fevers have been up to 102, most recently to 100 today.  She has been taking macrobid for a UTI for the past 7 days, this was prescribed by her orthopedist for presumed UTI.  She says that when she started taking the macrobid the fever seemed to get better, but recurred yesterday.  She denies current dysuria or flank pain.  She has a chronic runny nose and nasal congestion which is unchanged.  She has not had a cough, shortness of breath or chest pain.  She does not have any skin lesions or infections.  She has not had any nausea or vomiting or diarrhea.  She states that her appetite has been poor for several weeks.  She has been so tired she has not wanted to walk down the hall to meal times.  Clinical Impression  *Pt admitted with weakness*. Pt currently with functional limitations due to the deficits listed below (see PT Problem List).  Pt will benefit from skilled PT to increase their independence and safety with mobility to allow discharge to the venue listed below.   **    PT Assessment  Patient needs continued PT services    Follow Up Recommendations  Home health PT    Does the patient have the potential to tolerate intense rehabilitation      Barriers to Discharge        Equipment Recommendations  None recommended by PT    Recommendations for Other Services     Frequency Min 3X/week    Precautions / Restrictions  Precautions Precautions: Fall Precaution Comments: no h/o falls, but is generally weak now Restrictions Weight Bearing Restrictions: No   Pertinent Vitals/Pain **SaO2 93-97% on RA BP 129/83 in sitting -pt reported some dizziness 0/10 pain*      Mobility  Bed Mobility Bed Mobility: Supine to Sit Supine to Sit: 4: Min assist Details for Bed Mobility Assistance: assist to raise trunk Transfers Transfers: Sit to Stand;Stand to Sit Sit to Stand: 4: Min assist;From bed Stand to Sit: 4: Min guard;To chair/3-in-1 Details for Transfer Assistance: VCs hand placement, assist to rise/steady Ambulation/Gait Ambulation/Gait Assistance: 4: Min assist Ambulation Distance (Feet): 3 Feet Assistive device: 4-wheeled walker Ambulation/Gait Assistance Details: assist to steady, 4 pivotal steps to turn to recliner, distance limited by fatigue Gait Pattern: Decreased step length - right;Decreased step length - left    Exercises General Exercises - Lower Extremity Ankle Circles/Pumps: AROM;Both;10 reps;Supine   PT Diagnosis: Difficulty walking;Generalized weakness  PT Problem List: Decreased strength;Decreased activity tolerance;Decreased mobility PT Treatment Interventions: Gait training;Functional mobility training;Therapeutic activities;Therapeutic exercise;Patient/family education     PT Goals(Current goals can be found in the care plan section) Acute Rehab PT Goals Patient Stated Goal: to be able to walk to dining room (has to walk down long hallway "length of a football field") PT Goal Formulation: With patient/family Time For Goal Achievement: 03/29/13 Potential to Achieve Goals: Good  Visit Information  Last PT Received On:  03/15/13 Assistance Needed: +1 History of Present Illness: [name] [name] is a 79 y.o. female with past medical history of recurrent UTIs, COPD, lung nodules, CAD who presents today with concern about fever and weakness which have been occuring intermittently for about  10 days.  She presented to her PCP office today and was advised to come to the ED for further evaluation.  She states that fevers have been up to 102, most recently to 100 today.  She has been taking macrobid for a UTI for the past 7 days, this was prescribed by her orthopedist for presumed UTI.  She says that when she started taking the macrobid the fever seemed to get better, but recurred yesterday.  She denies current dysuria or flank pain.  She has a chronic runny nose and nasal congestion which is unchanged.  She has not had a cough, shortness of breath or chest pain.  She does not have any skin lesions or infections.  She has not had any nausea or vomiting or diarrhea.  She states that her appetite has been poor for several weeks.  She has been so tired she has not wanted to walk down the hall to meal times.       Prior Functioning  Home Living Family/patient expects to be discharged to:: Other (Comment) (Independent Living) Living Arrangements: Alone Home Equipment: Walker - 4 wheels;Other (comment) (oxygen) Additional Comments: daughter is able to come assist pt prn, meals can be delivered to pt's apartment; used O2 at night only Prior Function Level of Independence: Independent with assistive device(s) Comments: uses 4 wheeled walker to walk to dining room but this is getting tiring, plans to order a motorized scooter. Independent with bathing/dressing.  Communication Communication: No difficulties    Cognition  Cognition Arousal/Alertness: Awake/alert Behavior During Therapy: WFL for tasks assessed/performed Overall Cognitive Status: Within Functional Limits for tasks assessed    Extremity/Trunk Assessment Lower Extremity Assessment Lower Extremity Assessment: RLE deficits/detail RLE Deficits / Details: knee ext -4/5 Cervical / Trunk Assessment Cervical / Trunk Assessment: Kyphotic   Balance Balance Balance Assessed: Yes Static Sitting Balance Static Sitting - Balance Support:  Bilateral upper extremity supported;Feet supported Static Sitting - Level of Assistance: 6: Modified independent (Device/Increase time) Static Sitting - Comment/# of Minutes: 2  End of Session PT - End of Session Equipment Utilized During Treatment: Gait belt Activity Tolerance: Patient limited by fatigue Patient left: in chair;with call bell/phone within reach;with family/visitor present Nurse Communication: Mobility status  GP     [name] [name] 03/15/2013, 11:33 AM [phone]

## 2013-03-15 NOTE — Progress Notes (Signed)
ANTIBIOTIC CONSULT NOTE - INITIAL  Pharmacy Consult for Vanc, Cefepime Indication:   Allergies  Allergen Reactions  . Ciprofloxacin Swelling  . Penicillins     REACTION: hives, has had mild doses that she did not have reactions to  . Prednisone   . Sulfamethoxazole     REACTION: Nausea/vomiting    Patient Measurements: Height: 5\' 1"  (154.9 cm) Weight: 128 lb 15.5 oz (58.5 kg) IBW/kg (Calculated) : 47.8  Vital Signs: Temp: 98 F (36.7 C) (11/04 0553) Temp src: Oral (11/04 0553) BP: 127/59 mmHg (11/04 0553) Pulse Rate: 66 (11/04 0553) Intake/Output from previous day: 11/03 0701 - 11/04 0700 In: 867.5 [P.O.:360; I.V.:507.5] Out: 350 [Urine:350] Intake/Output from this shift:    Labs:  Recent Labs  03/14/13 1600 03/15/13 0518  WBC 9.5 7.9  HGB 12.7 11.5*  PLT 332 286  CREATININE 0.95 0.78    Assessment: 89 yoF from assisted living facility with h/o recurrent UTIs scheduled for elective R knee surgery 03/07/13 but canceled due to febrile symptoms, weakness, dehydration. Pt was prescribed Nitrofurantoin for presumed UTI without improvements and admitted for further workup 11/3. Pt reported intermittent fevers x 10 days. UA clear, CXR with no infection. MD order Zosyn for FUO but with PCN allergy - abx switched to Vanc/Cefepime.  11/4 >> Vanc >> 11/4 >> Zosyn >>  Tmax: 100 WBCs: wnl Renal: Scr 0.78, CG 41, N 56  11/3 blood x 2 >> pending  Goal of Therapy:  Vancomycin trough level 15-20 mcg/ml Renal adjustment for Cefepime  Plan:   Vancomycin 750 mg IV x 1 then 500 mg IV q12h  Cefepime 1g IV q24h   Pharmacy will f/u  Geoffry Paradise, PharmD, BCPS Pager: [phone] 7:26 AM Pharmacy #: [id number]

## 2013-03-15 NOTE — Progress Notes (Signed)
Utilization review completed.  

## 2013-03-16 DIAGNOSIS — C4359 Malignant melanoma of other part of trunk: Secondary | ICD-10-CM

## 2013-03-16 DIAGNOSIS — E876 Hypokalemia: Secondary | ICD-10-CM | POA: Diagnosis present

## 2013-03-16 LAB — CBC
Hemoglobin: 11.9 g/dL — ABNORMAL LOW (ref 12.0–15.0)
MCH: 27.9 pg (ref 26.0–34.0)
Platelets: 290 10*3/uL (ref 150–400)
RBC: 4.26 MIL/uL (ref 3.87–5.11)
WBC: 7.2 10*3/uL (ref 4.0–10.5)

## 2013-03-16 LAB — BASIC METABOLIC PANEL
CO2: 26 mEq/L (ref 19–32)
Calcium: 8.7 mg/dL (ref 8.4–10.5)
GFR calc Af Amer: 72 mL/min — ABNORMAL LOW (ref >90)
Glucose, Bld: 113 mg/dL — ABNORMAL HIGH (ref 70–99)
Potassium: 3.5 mEq/L (ref 3.5–5.1)
Sodium: 138 mEq/L (ref 135–145)

## 2013-03-16 LAB — URINE CULTURE: Culture: NO GROWTH

## 2013-03-16 NOTE — Progress Notes (Signed)
Patient had 7 beat run of SVT, NP on call notified.  Patient asymptomatic, resting comfortably.  Will continue to monitor.

## 2013-03-16 NOTE — Progress Notes (Signed)
[facility] PROGRESS NOTE  [name] [name] ZOX:[id number] DOB: 1923-05-25 DOA: 03/14/2013 PCP: Judie Petit, MD  Assessment/Plan: Fever; -To patient and daughter that the following criteria were not met for fever of unknown origin;  1). Fever higher than 38.3C on several occasions   2). Duration of fever for at least three weeks,   3). Uncertain diagnosis after one week of study in the hospital  - Patient's fever/elevated temperature most likely secondary to UTI (received 7 days of Macrobid prior to admission).   - CXR, UA unrevealing and no WBC elevation on CBC noted  - CT chest with mild interstitial edema but no clear cardiopulmonary source identified  - Tmax over 24 hours 100 F  - pt does have large granuloma as noted on the chest CT below but this is chronic in etiology  - since pt has history of breat cancer, melanoma of the chest, malignancy is also in the differential but CT scan shows no evidence of the etiology -Patient currently at her baseline SOB therefore would not expose to further radiation for a PE protocol CT scan, and less other signs/symptoms indicated - blood cultures and urine culture negative to date   - will proceed with CT abd/pelvis; see results below -HIV test ; negative -Will obtain a.m. CXR and if clear and patient is afebrile and negative WBC will discharge -    HYPERTENSION  - reasonable inpatient control  - continue Cozaar and Amlodipine   CAD, UNSPECIFIED SITE  - continue aspirin   COPD with emphysema, gold stage A  - clinically compensated  - pt maintaining oxygen saturations at target range   GERD  - continue PPI   History of breast cancer, T1b, N0, Lumpectomy 07/21/2007  - per pt this has been stable with no recurrent cancer   Melanoma of trunk, right chest   Hypokalemia  Resolved   Code Status: Full Family Communication: Daughter and patient aware of plan of care Disposition Plan: DC in a.m. if  stable   Consultants:    Procedures:  CT abdomen/pelvis with contrast 03/15/2013 1. There is exuberant sigmoid diverticulosis with a few diverticula  noted elsewhere especially in the descending colon. There is no  objective evidence of acute diverticulitis. Certainly low-grade  diverticulitis could be present with relatively few CT findings.  2. There are gallstones present without evidence of acute  cholecystitis. No acute abnormality of the liver or pancreas or  spleen is demonstrated.  3. There is no acute abnormality of the kidneys. There are cysts  present on the right which are stable.  4. There is mildly increased density at the lung bases as compared  to the previous study. I cannot exclude subsegmental atelectasis or  early pneumonia though the lungs are coarse are not included  entirely in the field of view. A followup chest x-ray would be  useful.   CT chest with contrast 03/14/2013 1. The appearance of the lungs suggests mild interstitial edema.  Strictly speaking superimposed infection such as bronchitis is  excluded, but is not strongly favored at this time.  2. Sequela of old granulomatous disease with large left upper lobe  calcified granuloma and chronic scarring redemonstrated. There is  also likely focus round atelectasis in the left lower lobe, as  above.  3. Mild cardiomegaly.  4. Atherosclerosis, including three-vessel coronary artery disease.  5. Mild centrilobular emphysema.  6. Additional incidental findings, as above.  CXR 03/14/2013 Chronic scarring changes in the left long and stable postoperative  changes.  No acute pulmonary findings.  All blood cultures; negative to date Urine culture 11/5; negative to date (final)  Antibiotics:  Cefepime 11/4>>>    HPI/Subjective: [name] [name] is a 79 y.o. female with past medical history of recurrent UTIs, COPD, lung nodules, CAD who presents today with concern about fever and weakness which have been  occuring intermittently for about 10 days. She presented to her PCP office today and was advised to come to the ED for further evaluation. She states that fevers have been up to 102, most recently to 100 today. She has been taking macrobid for a UTI for the past 7 days, this was prescribed by her orthopedist for presumed UTI. She says that when she started taking the macrobid the fever seemed to get better, but recurred yesterday. She denies current dysuria or flank pain. She has a chronic runny nose and nasal congestion which is unchanged. She has not had a cough, shortness of breath or chest pain. She does not have any skin lesions or infections. She has not had any nausea or vomiting or diarrhea. She states that her appetite has been poor for several weeks. She has been so tired she has not wanted to walk down the hall to meal times. TODAY patient feels greatly improved, no complaints      Objective: Filed Vitals:   03/15/13 1126 03/15/13 1414 03/15/13 2200 03/16/13 0500  BP: 129/83 154/51 128/60 129/63  Pulse:  68 69 62  Temp:  98.2 F (36.8 C) 98.5 F (36.9 C) 98.8 F (37.1 C)  TempSrc:  Axillary Axillary Axillary  Resp:  18 14 16   Height:      Weight:      SpO2: 97% 95% 98% 98%    Intake/Output Summary (Last 24 hours) at 03/16/13 0814 Last data filed at 03/16/13 0800  Gross per 24 hour  Intake 2465.5 ml  Output   1625 ml  Net  840.5 ml   Filed Weights   03/14/13 2118  Weight: 58.5 kg (128 lb 15.5 oz)         Social History: reports that she quit smoking about 29 years ago. Her smoking use included Cigarettes. She has a 60 pack-year smoking history. She has never used smokeless tobacco. She reports that she drinks alcohol. She reports that she does not use illicit drugs.  She lives in an assisted living facility. Daughter [name] [name] is present and very supportive. Uses rolling walker for ambulation   Exam: General: A./O. X4, not in acute distress  Cardiovascular: Regular rate  and rhythm, S1/S2, no murmurs, no rubs, no gallops, DP/PT pulse 2+ bilateral  Respiratory: Clear to auscultation bilaterally, no wheezing, no crackles, no rhonchi  Abdomen: Soft, non tender, non distended, bowel sounds present, no guarding  Extremities: No edema   Data Reviewed: Basic Metabolic Panel:  Recent Labs Lab 03/14/13 1600 03/15/13 0518 03/16/13 0528  NA 135 137 138  K 3.2* 3.5 3.5  CL 95* 101 103  CO2 28 25 26   GLUCOSE 126* 119* 113*  BUN 10 7 6   CREATININE 0.95 0.78 0.82  CALCIUM 9.5 8.3* 8.7   Liver Function Tests:  Recent Labs Lab 03/14/13 1600  AST 11  ALT 7  ALKPHOS 53  BILITOT 0.6  PROT 6.7  ALBUMIN 3.3*   No results found for this basename: LIPASE, AMYLASE,  in the last 168 hours No results found for this basename: AMMONIA,  in the last 168 hours CBC:  Recent Labs Lab 03/14/13  1600 03/15/13 0518 03/16/13 0528  WBC 9.5 7.9 7.2  NEUTROABS 6.7  --   --   HGB 12.7 11.5* 11.9*  HCT 36.6 33.6* 35.2*  MCV 82.4 82.4 82.6  PLT 332 286 290   Cardiac Enzymes: No results found for this basename: CKTOTAL, CKMB, CKMBINDEX, TROPONINI,  in the last 168 hours BNP (last 3 results) No results found for this basename: PROBNP,  in the last 8760 hours CBG: No results found for this basename: GLUCAP,  in the last 168 hours  Recent Results (from the past 240 hour(s))  CULTURE, BLOOD (ROUTINE X 2)     Status: None   Collection Time    03/14/13  4:23 PM      Result Value Range Status   Specimen Description BLOOD LEFT ANTECUBITAL   Final   Special Requests NONE BOTTLES DRAWN AEROBIC AND ANAEROBIC 5CC   Final   Culture  Setup Time     Final   Value: 03/14/2013 20:26     Performed at [facility]   Culture     Final   Value:        BLOOD CULTURE RECEIVED NO GROWTH TO DATE CULTURE WILL BE HELD FOR 5 DAYS BEFORE ISSUING A FINAL NEGATIVE REPORT     Performed at [facility]   Report Status PENDING   Incomplete  CULTURE, BLOOD (ROUTINE X 2)      Status: None   Collection Time    03/14/13  5:00 PM      Result Value Range Status   Specimen Description BLOOD L HAND   Final   Special Requests NONE BOTTLES DRAWN AEROBIC AND ANAEROBIC 2.5CC   Final   Culture  Setup Time     Final   Value: 03/14/2013 20:26     Performed at [facility]   Culture     Final   Value:        BLOOD CULTURE RECEIVED NO GROWTH TO DATE CULTURE WILL BE HELD FOR 5 DAYS BEFORE ISSUING A FINAL NEGATIVE REPORT     Performed at [facility]   Report Status PENDING   Incomplete     Studies: Dg Chest 2 View  03/14/2013   CLINICAL DATA:  Cough and fever.  EXAM: CHEST  2 VIEW  COMPARISON:  05/19/2012.  FINDINGS: Stable scarring changes in the left long along with postoperative changes. No new/acute pulmonary findings. The heart is borderline enlarged but stable. Stable tortuosity and calcification of the thoracic aorta.  IMPRESSION: Chronic scarring changes in the left long and stable postoperative changes.  No acute pulmonary findings.   Electronically Signed   By: Loralie Champagne M.D.   On: 03/14/2013 15:31   Ct Chest W Contrast  03/14/2013   CLINICAL DATA:  Weakness and fevers intermittently for the past 10 days.  EXAM: CT CHEST WITH CONTRAST  TECHNIQUE: Multidetector CT imaging of the chest was performed during intravenous contrast administration.  CONTRAST:  80mL OMNIPAQUE IOHEXOL 300 MG/ML  SOLN  COMPARISON:  Chest CT 06/24/1998.  FINDINGS: Mediastinum: Heart size is borderline. There is no significant pericardial fluid, thickening or pericardial calcification. There is atherosclerosis of the thoracic aorta, the great vessels of the mediastinum and the coronary arteries, including calcified atherosclerotic plaque in the left anterior descending, left circumflex and right coronary arteries. Numerous prominent but non pathologically enlarged mediastinal and nodes presumably. No definite pathologic lymphadenopathy identified esophagus is unremarkable in  appearance.  Lungs/Pleura: Large calcified nodule in  the left upper lobe old abutting the major fissure, surrounding which there is extensive architectural distortion which extends into the anterior aspect of the left upper lobe. These findings are similar to but increased compared to the prior examination, most compatible with chronic scarring. Pleural-based nodular opacity 1.9 x 0.8 cm the periphery of left lower lobe (image 38 of series 4) similar to the remote prior study from 06/24/2006, and favored to represent a focus of round atelectasis. Diffuse interlobular septal thickening, suggesting background mild interstitial edema. No confluent consolidative airspace disease. Mild diffuse bronchial wall thickening. Mild centrilobular emphysema. No pleural effusions.  Upper Abdomen: Exophytic low-attenuation lesion extending off the upper pole the right kidney is incompletely visualized, favored to represent a small cyst.  Musculoskeletal: There are no aggressive appearing lytic or blastic lesions noted in the visualized portions of the skeleton. Multiple old healed left-sided rib fractures  IMPRESSION: 1. The appearance of the lungs suggests mild interstitial edema. Strictly speaking superimposed infection such as bronchitis is excluded, but is not strongly favored at this time. 2. Sequela of old granulomatous disease with large left upper lobe calcified granuloma and chronic scarring redemonstrated. There is also likely focus round atelectasis in the left lower lobe, as above. 3. Mild cardiomegaly. 4.  Atherosclerosis, including three-vessel coronary artery disease. 5. Mild centrilobular emphysema. 6. Additional incidental findings, as above.   Electronically Signed   By: Trudie Reed M.D.   On: 03/14/2013 22:09   Ct Abdomen Pelvis W Contrast  03/15/2013   CLINICAL DATA:  Fever, urinary tract infection, history of colitis, COPD, and coronary artery disease. The patient has undergone previous appendectomy and  right mastectomy.  EXAM: CT ABDOMEN AND PELVIS WITH CONTRAST  TECHNIQUE: Multidetector CT imaging of the abdomen and pelvis was performed using the standard protocol following bolus administration of intravenous contrast.  CONTRAST:  80mL OMNIPAQUE IOHEXOL 300 MG/ML  SOLN  COMPARISON:  CT scan of the abdomen dated January 04, 2013.  FINDINGS: The liver exhibits decreased density diffusely consistent with fatty infiltration. There is no focal mass or ductal dilation. Gallstones are again demonstrated. There is no evidence of acute cholecystitis. The pancreas, spleen, adrenal glands, and kidneys exhibit no acute abnormalities. There are extrarenal pelves bilaterally. The abdominal aorta is normal in caliber.  The stomach is partially distended with gas and contrast and food particles. The duodenum and jejunum and much of the ileum contain oral contrast. The colon demonstrates a normal stool and gas pattern. There are scattered diverticula in the descending colon and there are exuberant diverticula in the rectosigmoid. There is no objective evidence of acute diverticulitis.  The urinary bladder is partially distended and grossly normal. The uterus is surgically absent. There are no adnexal masses.  At lung window settings there are emphysematous changes bilaterally. There is parenchymal density on the uppermost image of the scan in the region of the lingula. This is not entirely new but is more conspicuous than in the past. There is also a new tiny pleural effusion on the left. There is a minimal amount of subsegmental atelectasis in the posterior costophrenic gutter on the right as well. This is also new.  IMPRESSION: 1. There is exuberant sigmoid diverticulosis with a few diverticula noted elsewhere especially in the descending colon. There is no objective evidence of acute diverticulitis. Certainly low-grade diverticulitis could be present with relatively few CT findings. 2. There are gallstones present without  evidence of acute cholecystitis. No acute abnormality of the liver or pancreas or  spleen is demonstrated. 3. There is no acute abnormality of the kidneys. There are cysts present on the right which are stable. 4. There is mildly increased density at the lung bases as compared to the previous study. I cannot exclude subsegmental atelectasis or early pneumonia though the lungs are coarse are not included entirely in the field of view. A followup chest x-ray would be useful.   Electronically Signed   By: David  [place]   On: 03/15/2013 15:13    Scheduled Meds: . amLODipine  2.5 mg Oral Daily  . aspirin EC  81 mg Oral Daily  . ceFEPime (MAXIPIME) IV  1 g Intravenous Q24H  . diazepam  2.5 mg Oral QHS  . fluticasone  1 spray Each Nare Daily  . heparin  5,000 Units Subcutaneous Q8H  . losartan  50 mg Oral Daily  . pantoprazole  40 mg Oral Daily  . sodium chloride  3 mL Intravenous Q12H  . vancomycin  500 mg Intravenous Q12H   Continuous Infusions: . sodium chloride 50 mL/hr at 03/15/13 1646    Principal Problem:   Fever of unknown origin Active Problems:   DISEASE, PULMONARY D/T MYCOBACTERIA   ANXIETY   HYPERTENSION   CAD, UNSPECIFIED SITE   COPD with emphysema, gold stage A   GERD   History of breast cancer, T1b, N0, Lumpectomy 07/21/2007.   Melanoma of trunk, right chest    Time spent: 50 minute    Danisa Kopec, J  [facility] Pager [phone]. If 7PM-7AM, please contact night-coverage at [email or web address], password [facility] 03/16/2013, 8:15 AM  LOS: 2 days

## 2013-03-16 NOTE — Progress Notes (Signed)
Physical Therapy Treatment Patient Details Name: [name] [name] MRN: [id number] DOB: [birth year] Today's Date: 03/16/2013 Time: 7829-5621 PT Time Calculation (min): 27 min  PT Assessment / Plan / Recommendation  History of Present Illness     PT Comments   Pt in bed on 2 lts O2.  Daughter at bedside.  Assisted pt OOB to amb in hallway twice with one sitting rest breaks.     Follow Up Recommendations  Home health PT (@  ALF)     Does the patient have the potential to tolerate intense rehabilitation     Barriers to Discharge        Equipment Recommendations       Recommendations for Other Services    Frequency Min 3X/week   Progress towards PT Goals Progress towards PT goals: Progressing toward goals  Plan      Precautions / Restrictions Precautions Precautions: Fall Precaution Comments: O2 Restrictions Weight Bearing Restrictions: No    Pertinent Vitals/Pain No c/o pain    Mobility  Bed Mobility Bed Mobility: Supine to Sit;Sit to Supine Supine to Sit: 4: Min guard Sit to Supine: 4: Min guard Details for Bed Mobility Assistance: increased time Transfers Transfers: Sit to Stand;Stand to Sit Sit to Stand: 4: Min guard Stand to Sit: 4: Min guard Details for Transfer Assistance: 25% VC's on proper tech, hand placemnt and safety with turns Ambulation/Gait Ambulation/Gait Assistance: 4: Min assist;4: Min guard Ambulation Distance (Feet): 60 Feet Assistive device: 4-wheeled walker Ambulation/Gait Assistance Details: amb on 2 lts sats avg 95%.  Pt c/o mod fatigue and B LE weakness Gait Pattern: Decreased step length - right;Decreased step length - left Gait velocity: decreased     PT Goals (current goals can now be found in the care plan section)    Visit Information       Subjective Data      Cognition       Balance     End of Session PT - End of Session Equipment Utilized During Treatment: Gait belt Activity Tolerance: Patient limited by fatigue Patient  left: in bed;with call bell/phone within reach;with family/visitor present   Felecia Shelling  PTA [facility]  Acute  Rehab Pager      [phone]

## 2013-03-17 ENCOUNTER — Inpatient Hospital Stay (HOSPITAL_COMMUNITY): Payer: Medicare Other

## 2013-03-17 DIAGNOSIS — I10 Essential (primary) hypertension: Secondary | ICD-10-CM

## 2013-03-17 DIAGNOSIS — Z853 Personal history of malignant neoplasm of breast: Secondary | ICD-10-CM

## 2013-03-17 DIAGNOSIS — I2510 Atherosclerotic heart disease of native coronary artery without angina pectoris: Secondary | ICD-10-CM

## 2013-03-17 DIAGNOSIS — E876 Hypokalemia: Secondary | ICD-10-CM

## 2013-03-17 MED ORDER — DIAZEPAM 5 MG PO TABS: 2.5000 mg | ORAL_TABLET | Freq: Every day | ORAL | Status: DC

## 2013-03-17 MED ORDER — DIPHENHYDRAMINE HCL 12.5 MG/5ML PO ELIX
6.2500 mg | ORAL_SOLUTION | Freq: Once | ORAL | Status: DC
  Filled 2013-03-17: qty 5

## 2013-03-17 NOTE — Discharge Summary (Signed)
Physician Discharge Summary  [name] [name] WUJ:[id number] DOB: 1924-03-16 DOA: 03/14/2013  PCP: Judie Petit, MD  Admit date: 03/14/2013 Discharge date: 03/17/2013  Time spent: 40 minutes  Recommendations for Outpatient Follow-up:  Suspected Fever Unknown Origin;  -Consuled  patient and daughter that the following criteria were not met for fever of unknown origin;  1). Fever higher than 38.3C on several occasions  2). Duration of fever for at least three weeks,  3). Uncertain diagnosis after one week of study in the hospital  - Patient's fever/elevated temperature most likely secondary to UTI (received 7 days of Macrobid prior to admission).  - CXR, UA unrevealing and no WBC elevation on CBC noted  - CT chest with mild interstitial edema but no clear cardiopulmonary source identified  - Tmax over 24 hours 100 F  - pt does have large granuloma as noted on the chest CT below but this is chronic in etiology  - since pt has history of breat cancer, melanoma of the chest, malignancy is also in the differential but CT scan shows no evidence of the etiology  -Patient currently at her baseline SOB therefore would not expose to further radiation for a PE protocol CT scan, and less other signs/symptoms indicated  - blood cultures and urine culture negative to date  - will proceed with CT abd/pelvis; see results below  -HIV test ; negative  -Will obtain a.m. CXR and if clear and patient is afebrile and negative WBC will discharge    HYPERTENSION  - reasonable inpatient control  - continue Cozaar and Amlodipine   CAD, UNSPECIFIED SITE  - continue aspirin   COPD with emphysema, gold stage A  - clinically compensated  - pt maintaining oxygen saturations at target range   GERD  - continue PPI   History of breast cancer, T1b, N0, Lumpectomy 07/21/2007  - per pt this has been stable with no recurrent cancer    Hypokalemia  Resolved   Deconditioned -Will arrange home health and physical  therapy the patient    Discharge Diagnoses:  Active Problems:   DISEASE, PULMONARY D/T MYCOBACTERIA   ANXIETY   HYPERTENSION   CAD, UNSPECIFIED SITE   COPD with emphysema, gold stage A   GERD   History of breast cancer, T1b, N0, Lumpectomy 07/21/2007.   Melanoma of trunk, right chest   Hypokalemia   Discharge Condition: stable   Diet recommendation: heart healthy  Filed Weights   03/14/13 2118  Weight: 58.5 kg (128 lb 15.5 oz)    History of present illness:  [name] [name] is a 79 y.o. WF PMHx recurrent UTIs, COPD, lung nodules, CAD who presents today with concern about fever and weakness which have been occuring intermittently for about 10 days. She presented to her PCP office today and was advised to come to the ED for further evaluation. She states that fevers have been up to 102, most recently to 100 today. She has been taking macrobid for a UTI for the past 7 days, this was prescribed by her orthopedist for presumed UTI. She says that when she started taking the macrobid the fever seemed to get better, but recurred yesterday. She denies current dysuria or flank pain. She has a chronic runny nose and nasal congestion which is unchanged. She has not had a cough, shortness of breath or chest pain. She does not have any skin lesions or infections. She has not had any nausea or vomiting or diarrhea. She states that her appetite has been poor for  several weeks. She has been so tired she has not wanted to walk down the hall to meal times. 03/16/2013 patient feels greatly improved, no complaints. TODAY patient states feeling at baseline and eager to be discharged    Procedures:  CXR 03/17/2013 Moderate cardiac enlargement is again noted. No pleural or  pericardial effusion identified. Chronic disk pleural and  parenchymal scarring involving the left hemi thorax is similar to  previous exam. There are coarsened interstitial markings identified  bilaterally compatible with COPD. No  superimposed airspace  consolidation identified.  CT abdomen/pelvis with contrast 03/15/2013 1. There is exuberant sigmoid diverticulosis with a few diverticula  noted elsewhere especially in the descending colon. There is no  objective evidence of acute diverticulitis. Certainly low-grade  diverticulitis could be present with relatively few CT findings.  2. There are gallstones present without evidence of acute  cholecystitis. No acute abnormality of the liver or pancreas or  spleen is demonstrated.  3. There is no acute abnormality of the kidneys. There are cysts  present on the right which are stable.  4. There is mildly increased density at the lung bases as compared  to the previous study. I cannot exclude subsegmental atelectasis or  early pneumonia though the lungs are coarse are not included  entirely in the field of view. A followup chest x-ray would be  useful.   CT chest with contrast 03/14/2013  1. The appearance of the lungs suggests mild interstitial edema.  Strictly speaking superimposed infection such as bronchitis is  excluded, but is not strongly favored at this time.  2. Sequela of old granulomatous disease with large left upper lobe  calcified granuloma and chronic scarring redemonstrated. There is  also likely focus round atelectasis in the left lower lobe, as  above.  3. Mild cardiomegaly.  4. Atherosclerosis, including three-vessel coronary artery disease.  5. Mild centrilobular emphysema.  6. Additional incidental findings, as above.   CXR 03/14/2013  Chronic scarring changes in the left long and stable postoperative  changes.  No acute pulmonary findings.   All blood cultures; negative to date(Pending); will be held for 5 days before issuing final negative  Urine culture 11/5; negative to date (final)  Consultations:    Discharge Exam: Filed Vitals:   03/16/13 1324 03/16/13 2217 03/17/13 0501 03/17/13 0958  BP: 122/68 138/65 133/54 141/56  Pulse:  77 61 59   Temp: 98.1 F (36.7 C) 98.3 F (36.8 C) 98.2 F (36.8 C)   TempSrc: Oral Oral Oral   Resp: 16 16 16    Height:      Weight:      SpO2: 93% 98% 91%    General: A./O. X4, not in acute distress  Cardiovascular: Regular rate and rhythm, S1/S2, no murmurs, no rubs, no gallops, DP/PT pulse 2+ bilateral  Respiratory: Clear to auscultation bilaterally, no wheezing, no crackles, no rhonchi  Abdomen: Soft, non tender, non distended, bowel sounds present, no guarding  Extremities: No edema  Discharge Instructions     Medication List    ASK your doctor about these medications       AEROCHAMBER MV inhaler  Use as instructed     albuterol 108 (90 BASE) MCG/ACT inhaler  Commonly known as:  PROVENTIL HFA;VENTOLIN HFA  Inhale 2 puffs into the lungs every 6 (six) hours as needed for wheezing.     AMBULATORY NON FORMULARY MEDICATION  Oxygen at bedtime-2 1/2 liters     amLODipine 2.5 MG tablet  Commonly known as:  NORVASC  Take 2.5 mg by mouth daily.     aspirin 81 MG tablet  Take 81 mg by mouth daily.     diazepam 5 MG tablet  Commonly known as:  VALIUM  Take 2.5 mg by mouth at bedtime.     losartan 50 MG tablet  Commonly known as:  COZAAR  Take 50 mg by mouth daily.     mometasone 50 MCG/ACT nasal spray  Commonly known as:  NASONEX  Place 1 spray into the nose 2 (two) times daily.     nitroGLYCERIN 0.4 MG SL tablet  Commonly known as:  NITROSTAT  Place 0.4 mg under the tongue every 5 (five) minutes as needed for chest pain.     omeprazole 20 MG capsule  Commonly known as:  PRILOSEC  Take 20 mg by mouth daily.     polyethylene glycol packet  Commonly known as:  MIRALAX / GLYCOLAX  Take 17 g by mouth daily as needed (for constipation).       Allergies  Allergen Reactions  . Ciprofloxacin Swelling  . Penicillins     REACTION: hives, has had mild doses that she did not have reactions to  . Prednisone   . Sulfamethoxazole     REACTION: Nausea/vomiting       The results of significant diagnostics from this hospitalization (including imaging, microbiology, ancillary and laboratory) are listed below for reference.    Significant Diagnostic Studies: Dg Chest 2 View  03/17/2013   CLINICAL DATA:  Cough and shortness of Breath  EXAM: CHEST  2 VIEW  COMPARISON:  03/14/2013  FINDINGS: Moderate cardiac enlargement is again noted. No pleural or pericardial effusion identified. Chronic disk pleural and parenchymal scarring involving the left hemi thorax is similar to previous exam. There are coarsened interstitial markings identified bilaterally compatible with COPD. No superimposed airspace consolidation identified.  IMPRESSION: 1. Chronic scarring in the left lung and stable postoperative changes.  2. No acute findings.   Electronically Signed   By: Signa Kell M.D.   On: 03/17/2013 08:04   Dg Chest 2 View  03/14/2013   CLINICAL DATA:  Cough and fever.  EXAM: CHEST  2 VIEW  COMPARISON:  05/19/2012.  FINDINGS: Stable scarring changes in the left long along with postoperative changes. No new/acute pulmonary findings. The heart is borderline enlarged but stable. Stable tortuosity and calcification of the thoracic aorta.  IMPRESSION: Chronic scarring changes in the left long and stable postoperative changes.  No acute pulmonary findings.   Electronically Signed   By: Loralie Champagne M.D.   On: 03/14/2013 15:31   Ct Chest W Contrast  03/14/2013   CLINICAL DATA:  Weakness and fevers intermittently for the past 10 days.  EXAM: CT CHEST WITH CONTRAST  TECHNIQUE: Multidetector CT imaging of the chest was performed during intravenous contrast administration.  CONTRAST:  80mL OMNIPAQUE IOHEXOL 300 MG/ML  SOLN  COMPARISON:  Chest CT 06/24/1998.  FINDINGS: Mediastinum: Heart size is borderline. There is no significant pericardial fluid, thickening or pericardial calcification. There is atherosclerosis of the thoracic aorta, the great vessels of the mediastinum and the  coronary arteries, including calcified atherosclerotic plaque in the left anterior descending, left circumflex and right coronary arteries. Numerous prominent but non pathologically enlarged mediastinal and nodes presumably. No definite pathologic lymphadenopathy identified esophagus is unremarkable in appearance.  Lungs/Pleura: Large calcified nodule in the left upper lobe old abutting the major fissure, surrounding which there is extensive architectural distortion which extends into the anterior  aspect of the left upper lobe. These findings are similar to but increased compared to the prior examination, most compatible with chronic scarring. Pleural-based nodular opacity 1.9 x 0.8 cm the periphery of left lower lobe (image 38 of series 4) similar to the remote prior study from 06/24/2006, and favored to represent a focus of round atelectasis. Diffuse interlobular septal thickening, suggesting background mild interstitial edema. No confluent consolidative airspace disease. Mild diffuse bronchial wall thickening. Mild centrilobular emphysema. No pleural effusions.  Upper Abdomen: Exophytic low-attenuation lesion extending off the upper pole the right kidney is incompletely visualized, favored to represent a small cyst.  Musculoskeletal: There are no aggressive appearing lytic or blastic lesions noted in the visualized portions of the skeleton. Multiple old healed left-sided rib fractures  IMPRESSION: 1. The appearance of the lungs suggests mild interstitial edema. Strictly speaking superimposed infection such as bronchitis is excluded, but is not strongly favored at this time. 2. Sequela of old granulomatous disease with large left upper lobe calcified granuloma and chronic scarring redemonstrated. There is also likely focus round atelectasis in the left lower lobe, as above. 3. Mild cardiomegaly. 4.  Atherosclerosis, including three-vessel coronary artery disease. 5. Mild centrilobular emphysema. 6. Additional  incidental findings, as above.   Electronically Signed   By: Trudie Reed M.D.   On: 03/14/2013 22:09   Ct Abdomen Pelvis W Contrast  03/15/2013   CLINICAL DATA:  Fever, urinary tract infection, history of colitis, COPD, and coronary artery disease. The patient has undergone previous appendectomy and right mastectomy.  EXAM: CT ABDOMEN AND PELVIS WITH CONTRAST  TECHNIQUE: Multidetector CT imaging of the abdomen and pelvis was performed using the standard protocol following bolus administration of intravenous contrast.  CONTRAST:  80mL OMNIPAQUE IOHEXOL 300 MG/ML  SOLN  COMPARISON:  CT scan of the abdomen dated January 04, 2013.  FINDINGS: The liver exhibits decreased density diffusely consistent with fatty infiltration. There is no focal mass or ductal dilation. Gallstones are again demonstrated. There is no evidence of acute cholecystitis. The pancreas, spleen, adrenal glands, and kidneys exhibit no acute abnormalities. There are extrarenal pelves bilaterally. The abdominal aorta is normal in caliber.  The stomach is partially distended with gas and contrast and food particles. The duodenum and jejunum and much of the ileum contain oral contrast. The colon demonstrates a normal stool and gas pattern. There are scattered diverticula in the descending colon and there are exuberant diverticula in the rectosigmoid. There is no objective evidence of acute diverticulitis.  The urinary bladder is partially distended and grossly normal. The uterus is surgically absent. There are no adnexal masses.  At lung window settings there are emphysematous changes bilaterally. There is parenchymal density on the uppermost image of the scan in the region of the lingula. This is not entirely new but is more conspicuous than in the past. There is also a new tiny pleural effusion on the left. There is a minimal amount of subsegmental atelectasis in the posterior costophrenic gutter on the right as well. This is also new.   IMPRESSION: 1. There is exuberant sigmoid diverticulosis with a few diverticula noted elsewhere especially in the descending colon. There is no objective evidence of acute diverticulitis. Certainly low-grade diverticulitis could be present with relatively few CT findings. 2. There are gallstones present without evidence of acute cholecystitis. No acute abnormality of the liver or pancreas or spleen is demonstrated. 3. There is no acute abnormality of the kidneys. There are cysts present on the right which are  stable. 4. There is mildly increased density at the lung bases as compared to the previous study. I cannot exclude subsegmental atelectasis or early pneumonia though the lungs are coarse are not included entirely in the field of view. A followup chest x-ray would be useful.   Electronically Signed   By: David  [place]   On: 03/15/2013 15:13    Microbiology: Recent Results (from the past 240 hour(s))  CULTURE, BLOOD (ROUTINE X 2)     Status: None   Collection Time    03/14/13  4:23 PM      Result Value Range Status   Specimen Description BLOOD LEFT ANTECUBITAL   Final   Special Requests NONE BOTTLES DRAWN AEROBIC AND ANAEROBIC 5CC   Final   Culture  Setup Time     Final   Value: 03/14/2013 20:26     Performed at [facility]   Culture     Final   Value:        BLOOD CULTURE RECEIVED NO GROWTH TO DATE CULTURE WILL BE HELD FOR 5 DAYS BEFORE ISSUING A FINAL NEGATIVE REPORT     Performed at [facility]   Report Status PENDING   Incomplete  CULTURE, BLOOD (ROUTINE X 2)     Status: None   Collection Time    03/14/13  5:00 PM      Result Value Range Status   Specimen Description BLOOD L HAND   Final   Special Requests NONE BOTTLES DRAWN AEROBIC AND ANAEROBIC 2.5CC   Final   Culture  Setup Time     Final   Value: 03/14/2013 20:26     Performed at [facility]   Culture     Final   Value:        BLOOD CULTURE RECEIVED NO GROWTH TO DATE CULTURE WILL BE HELD FOR 5 DAYS  BEFORE ISSUING A FINAL NEGATIVE REPORT     Performed at [facility]   Report Status PENDING   Incomplete  URINE CULTURE     Status: None   Collection Time    03/15/13  1:00 PM      Result Value Range Status   Specimen Description URINE, CLEAN CATCH   Final   Special Requests NONE   Final   Culture  Setup Time     Final   Value: 03/15/2013 19:24     Performed at [facility] Count     Final   Value: NO GROWTH     Performed at [facility]   Culture     Final   Value: NO GROWTH     Performed at [facility]   Report Status 03/16/2013 FINAL   Final     Labs: Basic Metabolic Panel:  Recent Labs Lab 03/14/13 1600 03/15/13 0518 03/16/13 0528  NA 135 137 138  K 3.2* 3.5 3.5  CL 95* 101 103  CO2 28 25 26   GLUCOSE 126* 119* 113*  BUN 10 7 6   CREATININE 0.95 0.78 0.82  CALCIUM 9.5 8.3* 8.7   Liver Function Tests:  Recent Labs Lab 03/14/13 1600  AST 11  ALT 7  ALKPHOS 53  BILITOT 0.6  PROT 6.7  ALBUMIN 3.3*   No results found for this basename: LIPASE, AMYLASE,  in the last 168 hours No results found for this basename: AMMONIA,  in the last 168 hours CBC:  Recent Labs Lab 03/14/13 1600 03/15/13 0518 03/16/13 0528  WBC  9.5 7.9 7.2  NEUTROABS 6.7  --   --   HGB 12.7 11.5* 11.9*  HCT 36.6 33.6* 35.2*  MCV 82.4 82.4 82.6  PLT 332 286 290   Cardiac Enzymes: No results found for this basename: CKTOTAL, CKMB, CKMBINDEX, TROPONINI,  in the last 168 hours BNP: BNP (last 3 results) No results found for this basename: PROBNP,  in the last 8760 hours CBG: No results found for this basename: GLUCAP,  in the last 168 hours     Signed:  Carolyne Littles, MD [facility] [phone] pager

## 2013-03-17 NOTE — Progress Notes (Signed)
Patient discharged to assisted living facility.  IV removed prior to discharge, IV site clean, dry, and intact.  Discharge teaching performed prior to discharge, patient and patient's daughter voiced understanding of discharge education.

## 2013-03-17 NOTE — Care Management Note (Addendum)
    Page 1 of 1   03/18/2013     11:10:52 AM   CARE MANAGEMENT NOTE 03/18/2013  Patient:  [facility]   Account Number:  [id number]  Date Initiated:  03/17/2013  Documentation initiated by:  Lanier Clam  Subjective/Objective Assessment:   79 Y/O F ADMITTED W/SOB.     Action/Plan:   FROM [place] [facility]   Anticipated DC Date:  03/18/2013   Anticipated DC Plan:  HOME W HOME HEALTH SERVICES      DC Planning Services  CM consult      Choice offered to / List presented to:  C-1 Patient        HH arranged  HH-2 PT  HH-4 NURSE'S AIDE      HH agency  [facility]   Status of service:  Completed, signed off Medicare Important Message given?   (If response is "NO", the following Medicare IM given date fields will be blank) Date Medicare IM given:   Date Additional Medicare IM given:    Discharge Disposition:  HOME W HOME HEALTH SERVICES  Per UR Regulation:  Reviewed for med. necessity/level of care/duration of stay  If discussed at Long Length of Stay Meetings, dates discussed:    Comments:  03/17/13 Kadance Mccuistion RN,BSN NCM [phone] [facility] CHOSEN FOR HHC,KRISTEN AWARE OF HHC ORDERS,& D/C.

## 2013-03-17 NOTE — Progress Notes (Signed)
PT Cancellation Note  Patient Details Name: [name] [name] MRN: [id number] DOB: 07-Nov-1923   Cancelled Treatment:     Pt stated she didn't sleep at all last night and needs to rest right now. Will follow.   Ralene Bathe Kistler 03/17/2013, 11:31 AM [phone]

## 2013-03-20 LAB — CULTURE, BLOOD (ROUTINE X 2)
Culture: NO GROWTH
Culture: NO GROWTH

## 2013-03-23 DIAGNOSIS — R269 Unspecified abnormalities of gait and mobility: Secondary | ICD-10-CM | POA: Diagnosis not present

## 2013-03-25 ENCOUNTER — Ambulatory Visit (INDEPENDENT_AMBULATORY_CARE_PROVIDER_SITE_OTHER): Payer: Medicare Other | Admitting: Family Medicine

## 2013-03-25 ENCOUNTER — Encounter: Payer: Self-pay | Admitting: Family Medicine

## 2013-03-25 VITALS — BP 110/70 | HR 93 | Temp 99.5°F

## 2013-03-25 DIAGNOSIS — R509 Fever, unspecified: Secondary | ICD-10-CM

## 2013-03-25 DIAGNOSIS — R269 Unspecified abnormalities of gait and mobility: Secondary | ICD-10-CM

## 2013-03-25 DIAGNOSIS — R2681 Unsteadiness on feet: Secondary | ICD-10-CM

## 2013-03-25 MED ORDER — CEPHALEXIN 500 MG PO CAPS: 500.0000 mg | ORAL_CAPSULE | Freq: Three times a day (TID) | ORAL | Status: DC

## 2013-03-25 MED ORDER — PROMETHAZINE HCL 25 MG PO TABS: 25.0000 mg | ORAL_TABLET | ORAL | Status: DC | PRN

## 2013-03-25 NOTE — Progress Notes (Signed)
  Subjective:    Patient ID: [name] [name], female    DOB: 1924-03-04, 79 y.o.   MRN: [id number]  HPI Here to follow up a hospital stay from 03-14-13 to 03-17-13 for a fever of unknown origin, most likely from a UTI. All cultures remained negative. Her WBC count was normal. Her CXR and multiple CT scans revealed no specific etiologies. She got stronger with IV fluids and some nutrition and was sent back to [facility]. Since getting home she has continued to feel weak however and has intermittent low grade fevers. She has nausea and finds it hard to eat or drink. She has felt some SOB but no coughing or chest pain. She wants to work with a PT to help with strength and walking.    Review of Systems  Constitutional: Positive for fever.  HENT: Negative.   Respiratory: Positive for shortness of breath. Negative for cough, chest tightness and wheezing.   Cardiovascular: Negative.   Gastrointestinal: Positive for nausea. Negative for vomiting, abdominal pain, diarrhea, constipation, blood in stool and abdominal distention.  Genitourinary: Negative.   Musculoskeletal: Positive for gait problem.       Objective:   Physical Exam  Constitutional:  Alert, using a walker  Neck: Neck supple.  Cardiovascular: Normal rate, regular rhythm, normal heart sounds and intact distal pulses.   Pulmonary/Chest: Effort normal and breath sounds normal.  Lymphadenopathy:    She has no cervical adenopathy.          Assessment & Plan:  Probable partially treated UTI. Given Keflex for 10 days. Use Phenergan for nausea. Drink plenty of fluids. We will refer her to outpatient PT.

## 2013-03-25 NOTE — Progress Notes (Signed)
Pre visit review using our clinic review tool, if applicable. No additional management support is needed unless otherwise documented below in the visit note. 

## 2013-03-29 DIAGNOSIS — I2510 Atherosclerotic heart disease of native coronary artery without angina pectoris: Secondary | ICD-10-CM | POA: Diagnosis not present

## 2013-03-29 DIAGNOSIS — N390 Urinary tract infection, site not specified: Secondary | ICD-10-CM | POA: Diagnosis not present

## 2013-03-29 DIAGNOSIS — R269 Unspecified abnormalities of gait and mobility: Secondary | ICD-10-CM | POA: Diagnosis not present

## 2013-03-29 DIAGNOSIS — IMO0001 Reserved for inherently not codable concepts without codable children: Secondary | ICD-10-CM | POA: Diagnosis not present

## 2013-03-29 DIAGNOSIS — I10 Essential (primary) hypertension: Secondary | ICD-10-CM | POA: Diagnosis not present

## 2013-03-29 DIAGNOSIS — Z792 Long term (current) use of antibiotics: Secondary | ICD-10-CM | POA: Diagnosis not present

## 2013-03-30 ENCOUNTER — Inpatient Hospital Stay (HOSPITAL_COMMUNITY): Payer: Medicare Other

## 2013-03-30 ENCOUNTER — Inpatient Hospital Stay (HOSPITAL_COMMUNITY)
Admission: AD | Admit: 2013-03-30 | Discharge: 2013-04-01 | DRG: 193 | Disposition: A | Discharge status: Home Health | Payer: Medicare Other | Source: Ambulatory Visit | Attending: Critical Care Medicine | Admitting: Critical Care Medicine

## 2013-03-30 ENCOUNTER — Encounter (HOSPITAL_COMMUNITY): Payer: Self-pay

## 2013-03-30 ENCOUNTER — Telehealth: Payer: Self-pay | Admitting: Internal Medicine

## 2013-03-30 ENCOUNTER — Ambulatory Visit (INDEPENDENT_AMBULATORY_CARE_PROVIDER_SITE_OTHER): Payer: Medicare Other | Admitting: Critical Care Medicine

## 2013-03-30 ENCOUNTER — Encounter: Payer: Self-pay | Admitting: Critical Care Medicine

## 2013-03-30 VITALS — BP 122/84 | HR 77 | Temp 97.5°F | Ht 61.5 in | Wt 124.2 lb

## 2013-03-30 DIAGNOSIS — K5730 Diverticulosis of large intestine without perforation or abscess without bleeding: Secondary | ICD-10-CM

## 2013-03-30 DIAGNOSIS — Z8249 Family history of ischemic heart disease and other diseases of the circulatory system: Secondary | ICD-10-CM

## 2013-03-30 DIAGNOSIS — J189 Pneumonia, unspecified organism: Secondary | ICD-10-CM

## 2013-03-30 DIAGNOSIS — J441 Chronic obstructive pulmonary disease with (acute) exacerbation: Secondary | ICD-10-CM | POA: Diagnosis present

## 2013-03-30 DIAGNOSIS — C4359 Malignant melanoma of other part of trunk: Secondary | ICD-10-CM

## 2013-03-30 DIAGNOSIS — IMO0002 Reserved for concepts with insufficient information to code with codable children: Secondary | ICD-10-CM

## 2013-03-30 DIAGNOSIS — M810 Age-related osteoporosis without current pathological fracture: Secondary | ICD-10-CM | POA: Diagnosis present

## 2013-03-30 DIAGNOSIS — I517 Cardiomegaly: Secondary | ICD-10-CM | POA: Diagnosis present

## 2013-03-30 DIAGNOSIS — Z8049 Family history of malignant neoplasm of other genital organs: Secondary | ICD-10-CM | POA: Diagnosis not present

## 2013-03-30 DIAGNOSIS — I2510 Atherosclerotic heart disease of native coronary artery without angina pectoris: Secondary | ICD-10-CM | POA: Diagnosis present

## 2013-03-30 DIAGNOSIS — J8410 Pulmonary fibrosis, unspecified: Secondary | ICD-10-CM | POA: Diagnosis present

## 2013-03-30 DIAGNOSIS — K219 Gastro-esophageal reflux disease without esophagitis: Secondary | ICD-10-CM

## 2013-03-30 DIAGNOSIS — Z85820 Personal history of malignant melanoma of skin: Secondary | ICD-10-CM | POA: Diagnosis not present

## 2013-03-30 DIAGNOSIS — Y9289 Other specified places as the place of occurrence of the external cause: Secondary | ICD-10-CM

## 2013-03-30 DIAGNOSIS — J984 Other disorders of lung: Secondary | ICD-10-CM | POA: Diagnosis not present

## 2013-03-30 DIAGNOSIS — Z8679 Personal history of other diseases of the circulatory system: Secondary | ICD-10-CM

## 2013-03-30 DIAGNOSIS — E43 Unspecified severe protein-calorie malnutrition: Secondary | ICD-10-CM | POA: Diagnosis present

## 2013-03-30 DIAGNOSIS — R51 Headache: Secondary | ICD-10-CM | POA: Diagnosis not present

## 2013-03-30 DIAGNOSIS — I059 Rheumatic mitral valve disease, unspecified: Secondary | ICD-10-CM | POA: Diagnosis present

## 2013-03-30 DIAGNOSIS — J438 Other emphysema: Secondary | ICD-10-CM

## 2013-03-30 DIAGNOSIS — G4730 Sleep apnea, unspecified: Secondary | ICD-10-CM | POA: Diagnosis present

## 2013-03-30 DIAGNOSIS — I4891 Unspecified atrial fibrillation: Secondary | ICD-10-CM | POA: Diagnosis present

## 2013-03-30 DIAGNOSIS — M899 Disorder of bone, unspecified: Secondary | ICD-10-CM | POA: Diagnosis present

## 2013-03-30 DIAGNOSIS — R110 Nausea: Secondary | ICD-10-CM | POA: Diagnosis present

## 2013-03-30 DIAGNOSIS — Z800 Family history of malignant neoplasm of digestive organs: Secondary | ICD-10-CM

## 2013-03-30 DIAGNOSIS — J439 Emphysema, unspecified: Secondary | ICD-10-CM

## 2013-03-30 DIAGNOSIS — Z87891 Personal history of nicotine dependence: Secondary | ICD-10-CM | POA: Diagnosis not present

## 2013-03-30 DIAGNOSIS — Z823 Family history of stroke: Secondary | ICD-10-CM | POA: Diagnosis not present

## 2013-03-30 DIAGNOSIS — Z9181 History of falling: Secondary | ICD-10-CM

## 2013-03-30 DIAGNOSIS — W010XXA Fall on same level from slipping, tripping and stumbling without subsequent striking against object, initial encounter: Secondary | ICD-10-CM | POA: Diagnosis present

## 2013-03-30 DIAGNOSIS — G4710 Hypersomnia, unspecified: Secondary | ICD-10-CM

## 2013-03-30 DIAGNOSIS — C50919 Malignant neoplasm of unspecified site of unspecified female breast: Secondary | ICD-10-CM | POA: Diagnosis not present

## 2013-03-30 DIAGNOSIS — Z803 Family history of malignant neoplasm of breast: Secondary | ICD-10-CM | POA: Diagnosis not present

## 2013-03-30 DIAGNOSIS — E876 Hypokalemia: Secondary | ICD-10-CM

## 2013-03-30 DIAGNOSIS — I7300 Raynaud's syndrome without gangrene: Secondary | ICD-10-CM | POA: Diagnosis present

## 2013-03-30 DIAGNOSIS — R5381 Other malaise: Secondary | ICD-10-CM | POA: Diagnosis present

## 2013-03-30 DIAGNOSIS — I10 Essential (primary) hypertension: Secondary | ICD-10-CM | POA: Diagnosis present

## 2013-03-30 DIAGNOSIS — J302 Other seasonal allergic rhinitis: Secondary | ICD-10-CM

## 2013-03-30 DIAGNOSIS — S0990XA Unspecified injury of head, initial encounter: Secondary | ICD-10-CM | POA: Diagnosis not present

## 2013-03-30 DIAGNOSIS — Z853 Personal history of malignant neoplasm of breast: Secondary | ICD-10-CM

## 2013-03-30 DIAGNOSIS — K8020 Calculus of gallbladder without cholecystitis without obstruction: Secondary | ICD-10-CM

## 2013-03-30 DIAGNOSIS — K7689 Other specified diseases of liver: Secondary | ICD-10-CM | POA: Diagnosis present

## 2013-03-30 DIAGNOSIS — Z9849 Cataract extraction status, unspecified eye: Secondary | ICD-10-CM

## 2013-03-30 DIAGNOSIS — F411 Generalized anxiety disorder: Secondary | ICD-10-CM

## 2013-03-30 DIAGNOSIS — A310 Pulmonary mycobacterial infection: Secondary | ICD-10-CM

## 2013-03-30 LAB — PHOSPHORUS: Phosphorus: 3.1 mg/dL (ref 2.3–4.6)

## 2013-03-30 LAB — CBC WITH DIFFERENTIAL/PLATELET
Basophils Relative: 0 % (ref 0–1)
Eosinophils Absolute: 0.2 10*3/uL (ref 0.0–0.7)
Hemoglobin: 12.4 g/dL (ref 12.0–15.0)
Lymphocytes Relative: 31 % (ref 12–46)
Lymphs Abs: 1.9 10*3/uL (ref 0.7–4.0)
MCH: 28.4 pg (ref 26.0–34.0)
MCV: 84.9 fL (ref 78.0–100.0)
Monocytes Absolute: 0.5 10*3/uL (ref 0.1–1.0)
Monocytes Relative: 8 % (ref 3–12)
Neutro Abs: 3.6 10*3/uL (ref 1.7–7.7)
Neutrophils Relative %: 57 % (ref 43–77)
Platelets: 232 10*3/uL (ref 150–400)
RBC: 4.36 MIL/uL (ref 3.87–5.11)
WBC: 6.2 10*3/uL (ref 4.0–10.5)

## 2013-03-30 LAB — COMPREHENSIVE METABOLIC PANEL
Albumin: 3.8 g/dL (ref 3.5–5.2)
Alkaline Phosphatase: 53 U/L (ref 39–117)
BUN: 6 mg/dL (ref 6–23)
Calcium: 9.4 mg/dL (ref 8.4–10.5)
Creatinine, Ser: 0.76 mg/dL (ref 0.50–1.10)
GFR calc Af Amer: 85 mL/min — ABNORMAL LOW (ref >90)
GFR calc non Af Amer: 74 mL/min — ABNORMAL LOW (ref >90)
Glucose, Bld: 103 mg/dL — ABNORMAL HIGH (ref 70–99)
Potassium: 4.3 mEq/L (ref 3.5–5.1)
Total Bilirubin: 0.6 mg/dL (ref 0.3–1.2)
Total Protein: 7.5 g/dL (ref 6.0–8.3)

## 2013-03-30 LAB — STREP PNEUMONIAE URINARY ANTIGEN: Strep Pneumo Urinary Antigen: NEGATIVE

## 2013-03-30 LAB — MAGNESIUM: Magnesium: 1.9 mg/dL (ref 1.5–2.5)

## 2013-03-30 MED ORDER — DIAZEPAM 5 MG PO TABS
2.5000 mg | ORAL_TABLET | Freq: Every evening | ORAL | Status: DC | PRN
  Administered 2013-03-30 – 2013-03-31 (×2): 2.5 mg via ORAL
  Filled 2013-03-30 (×2): qty 1

## 2013-03-30 MED ORDER — DEXTROSE 5 % IV SOLN
500.0000 mg | INTRAVENOUS | Status: DC
  Administered 2013-03-30: 500 mg via INTRAVENOUS
  Filled 2013-03-30 (×2): qty 500

## 2013-03-30 MED ORDER — SODIUM CHLORIDE 0.9 % IV SOLN
INTRAVENOUS | Status: DC
  Administered 2013-03-30: 14:00:00 via INTRAVENOUS

## 2013-03-30 MED ORDER — DEXTROSE 5 % IV SOLN
1.0000 g | INTRAVENOUS | Status: DC
  Administered 2013-03-30 – 2013-03-31 (×2): 1 g via INTRAVENOUS
  Filled 2013-03-30 (×3): qty 10
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
## 2013-03-30 NOTE — H&P (Signed)
PULMONARY  / CRITICAL CARE MEDICINE  Name: [name] [name] MRN: [id number] DOB: Mar 30, 1924    ADMISSION DATE:  03/30/2013  PRIMARY SERVICE:  PCCM/Vencil Basnett  CHIEF COMPLAINT:   Weakness, s/p fall, hit head, recent admission, fever  BRIEF PATIENT DESCRIPTION:  79 year old female with recent admission for fever with a negative evaluation but in retrospect likely had left lower lobe community acquired pneumonia. Now being readmitted for same plan for treatment. Note while in process of admission the patient fell in the parking lot and hit her head and this will be evaluated as well  SIGNIFICANT EVENTS / STUDIES:    LINES / TUBES: Peripheral IVs  CULTURES: Blood cultures x 2 03/30/2013 Sputum culture 03/30/2013 Legionella urine study 03/30/2013 Pneumococcal urine study 03/30/2013 Viral antigens 03/30/2013  ANTIBIOTICS: Rocephin 03/30/13 Azithromycin 03/30/2013  HISTORY OF PRESENT ILLNESS:    This is an 79 year old white female with gold stage A COPD 2 history admitted to the hospital for evaluation of left lower lobe pneumonia. This patient had been admitted between the third and sixth of November for fever of unclear origin. Extensive evaluation during that admission did demonstrate abnormalities in the left lower lobe but this was not confirmed and the patient was felt to have primarily a urinary tract infection. Note the urine cultures were negative and bloodThis is an 79 year old white female cultures were negative. This patient was subsequently sent home off all antibiotics.  She was seen in followup by her primary care provider who did prescribe a course of Keflex for persisting symptoms. She did have a fever up to 102 but is now reduced to normal. She does remain extremely weak. She has had ongoing shortness of breath and cough productive of thick yellow mucus. The patient has a chest tightness symptoms. The patient's symptoms have progressed and she is now being readmitted for further  evaluation. Note upon further review her CT scan of her chest did demonstrate pleural-based infiltrates in the left lower lobe. Physical exam is also concerning for ongoing pneumonitis left lower lobe.  Note the patient also has nausea and poor appetite. She has not been very ambulatory in her home since discharge.    PAST MEDICAL HISTORY :  Past Medical History  Diagnosis Date  . ANXIETY 08/16/2008  . CAD, UNSPECIFIED SITE 10/17/2008  . CHOLELITHIASIS 08/23/2008  . COPD 01/04/2010    FeV1 101%, DLCO64% 2008  . 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 DIVERTICULOSIS-COLON 04/07/2008  . GERD 04/19/2007  . HYPERSOMNIA, ASSOCIATED WITH SLEEP APNEA 02/01/2007  . HYPERTENSION 11/12/2006  . MITRAL VALVE PROLAPSE, HX OF 10/07/2008  . NEOPLASM, MALIGNANT, BREAST, RIGHT 02/15/2008  . OSTEOPOROSIS 04/04/2008  . Raynaud's syndrome 04/25/2009  . Airway obstruction   . History of diverticulitis of colon   . Tortuous colon   . Anal stricture   . Chronic constipation   . Fatty liver   . Hiatal hernia   . Esophageal stricture   . IBS (irritable bowel syndrome)   . Pulmonary fibrosis   . Sleep apnea   . COPD (chronic obstructive pulmonary disease)   . Colitis     sigmoid  . Candida esophagitis   . Diverticulitis   . Hearing aid worn     bilateral  . Atrial fibrillation     she denies known hx of afib 02/24/13  . Complication of anesthesia     difficult intubation   Past Surgical History  Procedure Laterality Date  . Abdominal hysterectomy    . Bilateral salpingoophorectomy  s/p prolapsed bladder  . Bladder surgery      prolapsed  . Thoracotomy      granuloma, pulmonary-thoracotomy  . Breast surgery  07-21-07    mastectomy (right), all margins neg and LNs neg   . Appendectomy    . Tonsillectomy    . Rotator cuff repair    . Mastectomy      right  . Breast cyst excision      left  . Mouth surgery    . Eye surgery      cataract removal bilateral  . Melanoma excision       rt clavicle Dr Hettie Holstein office)  . Lung surgery     Prior to Admission medications   Medication Sig Start Date End Date Taking? Authorizing Provider  albuterol (PROVENTIL HFA;VENTOLIN HFA) 108 (90 BASE) MCG/ACT inhaler Inhale 2 puffs into the lungs every 6 (six) hours as needed for wheezing.    Historical Provider, MD  AMBULATORY NON FORMULARY MEDICATION Oxygen at bedtime-2 or 2 1/2 liters    Historical Provider, MD  amLODipine (NORVASC) 2.5 MG tablet Take 2.5 mg by mouth daily.    Historical Provider, MD  aspirin 325 MG tablet Take 325 mg by mouth daily.    Historical Provider, MD  cephALEXin (KEFLEX) 500 MG capsule Take 1 capsule (500 mg total) by mouth 3 (three) times daily. 03/25/13 04/04/13  Nelwyn Salisbury, MD  diazepam (VALIUM) 5 MG tablet Take 0.5 tablets (2.5 mg total) by mouth at bedtime. 03/17/13   Drema Dallas, MD  losartan (COZAAR) 50 MG tablet Take 50 mg by mouth daily.    Historical Provider, MD  mometasone (NASONEX) 50 MCG/ACT nasal spray Place 1 spray into the nose 2 (two) times daily. 09/17/12   Storm Frisk, MD  nitroGLYCERIN (NITROSTAT) 0.4 MG SL tablet Place 0.4 mg under the tongue every 5 (five) minutes as needed for chest pain.    Historical Provider, MD  omeprazole (PRILOSEC) 20 MG capsule Take 20 mg by mouth daily.    Historical Provider, MD  polyethylene glycol (MIRALAX / GLYCOLAX) packet Take 17 g by mouth daily as needed (for constipation).    Historical Provider, MD  promethazine (PHENERGAN) 25 MG tablet Take 1 tablet (25 mg total) by mouth every 4 (four) hours as needed for nausea or vomiting. 03/25/13   Nelwyn Salisbury, MD  Spacer/Aero-Holding Chambers (AEROCHAMBER MV) inhaler Use as instructed 11/18/12   Leslye Peer, MD   Allergies  Allergen Reactions  . Ciprofloxacin Swelling  . Penicillins     REACTION: hives, has had mild doses that she did not have reactions to  . Prednisone   . Sulfamethoxazole     REACTION: Nausea/vomiting    FAMILY HISTORY:   Family History  Problem Relation Age of Onset  . Parkinsonism Father   . Coronary artery disease Father   . Breast cancer      aunt  . Colon cancer      aunt, age 40  . Uterine cancer      aunt  . Stroke Mother   . Heart failure Mother   . Cancer Maternal Aunt     breast   SOCIAL HISTORY:  reports that she quit smoking about 29 years ago. Her smoking use included Cigarettes. She has a 60 pack-year smoking history. She has never used smokeless tobacco. She reports that she drinks alcohol. She reports that she does not use illicit drugs.  REVIEW OF SYSTEMS:   Positive  in [place] Constitutional:  fever, chills, weight loss, malaise/fatigue and diaphoresis.  HENT: Negative for hearing loss, ear pain, nosebleeds, congestion, sore throat, neck pain, tinnitus and ear discharge.   Eyes: Negative for blurred vision, double vision, photophobia, pain, discharge and redness.  Respiratory:cough, hemoptysis, sputum production, shortness of breath, wheezing and stridor.   Cardiovascular: Negative for chest pain, palpitations, orthopnea, claudication, leg swelling and PND.  Gastrointestinal: heartburn, nausea, vomiting, abdominal pain, diarrhea, constipation, blood in stool and melena.  Genitourinary: Negative for dysuria, urgency, frequency, hematuria and flank pain.  Musculoskeletal: Negative for myalgias, back pain, joint pain and falls.  Skin: Negative for itching and rash.  Neurological: Negative for dizziness, tingling, tremors, sensory change, speech change, focal weakness, seizures, loss of consciousness, weakness and headaches.  Endo/Heme/Allergies: Negative for environmental allergies and polydipsia. Does not bruise/bleed easily.  SUBJECTIVE:  The patient complains of nausea dyspnea and cough  VITAL SIGNS: Temp:  [97.5 F (36.4 C)] 97.5 F (36.4 C) (11/19 1059) Pulse Rate:  [77] 77 (11/19 1059) BP: (122)/(84) 122/84 mmHg (11/19 1059) SpO2:  [96 %] 96 % (11/19 1059) Weight:  [124 lb  3.2 oz (56.337 kg)] 124 lb 3.2 oz (56.337 kg) (11/19 1059)  PHYSICAL EXAMINATION: General:  Ill-appearing white female in no acute distress Neuro:  Awake and alert no focal deficits HEENT:  Mucous membranes clear no lesions Neck:  Neck supple no thyromegaly no jugular venous distention Cardiovascular:  Regular rate and rhythm without S3 normal S1-S2 no murmur rub heave or gallop Lungs:  Rales left lower lobe half the way up , clear on the right , distant breath sounds Abdomen:  Soft nontender bowel sounds active no organomegaly Musculoskeletal:  Full range of motion no joint deformity Skin:  Clear  No results found for this basename: NA, K, CL, CO2, BUN, CREATININE, GLUCOSE,  in the last 168 hours No results found for this basename: HGB, HCT, WBC, PLT,  in the last 168 hours No results found.  ASSESSMENT / PLAN:  #1 left lower lobe community acquired pneumonia organism not specified  Plan   IV Rocephin and IV azithromycin will be    administered   Obtain cultures to include blood cultures and    sputum culture along with viral    antigens and Legionella and     pneumococcal urine antigens  #2   gold stage A COPD with exacerbation   Plan   Bronchodilator therapy   Flutter   Hold on steroids  #3  fall in parking lot on entry into hospital with possible head injury   No overt evidence of head trauma but will obtain CT scan of head to be complete   Caryl Bis  [phone]  Cell  [phone]  If no response or cell goes to voicemail, call beeper [phone]  Pulmonary and Critical Care Medicine [facility] Pager: [phone]  03/30/2013, 12:58 PM

## 2013-03-30 NOTE — Telephone Encounter (Signed)
I called Meriam Sprague and gave a verbal order with below instructions.

## 2013-03-30 NOTE — Telephone Encounter (Signed)
Pt was seen on 03-29-13. Pt will have physical therapy twice a wk for 1 wk and next wk only PT   once. Pt then will have PT twice a wk for 4 wk if needed. Please call beverly with verbal ok with the above plans

## 2013-03-30 NOTE — Progress Notes (Signed)
Subjective:    Patient ID: [name] [name], female    DOB: 08-28-1923, 79 y.o.   MRN: [id number]  HPI  This is a 79 y.o. 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 The pt also has hx of upper airway obstruction with difficult intubation with prior operative procedures. Also hx Breast CA s/p resection and XRT. Hx Gallstone disease with conservative Rx due to difficult intubation status.   10/11/2012 Pt with freq UTIs. Sees PCP.  Allergies bad this year. Now not much cough. Mucus does drain from the head. No real wheeze.  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 Notes some pndrip  No mucus from lungs  Acute OV 11/18/12 -- hx COPD, MAIC, UA irritation followed by Dr Timoteo Ace.  She has been well until 2 -3 days ago. Some leg weakness, fatigue. Lots of nasal congestion. Throat irritation. Probable fever last night. She ran out of nasonex a couple weeks ago.  Has taken ASA. Is on zyrtec qd.   01/04/2013 Chief Complaint  Patient presents with  . Follow-up    pt reports she has been feeling "pretty good"-- having some head congestion and drainage but overall doing well  Pt with UTI recurrent. Urology, bladder is painful.  Incontinent Dysuria , chills etc.  Not on much ABX Dyspnea still with exertion.  Pt still with pndrip and clear mucus.  03/30/2013 Chief Complaint  Patient presents with  . Follow-up    recently in [facility].  "Feels terrible," no appetite, fatigue, weakness, increased SOB, and chest tightness.  Coughing some - prod at times with yellow mucus.  Recently in hosp, has home PT Fever unclear origin.  CT neg. Prob UTI.  hosp stay 11/3- 11/6  Pt ill with chest tightness, feels like a band, no radiation. No fever now.  Saw PCP sl fever, cephalexin rx.  ?UTI Off and on abx. Notes some cough is productive and is yellow.  Notes some pndrip. Notes more dyspnea.  No energy, wont go for meals.  3weeks. Notes some  pndrip and sinus pressure.  No joint issues.   C/o nausea. No eating.  No emesis.  Rx nausea phenegren per pcp    Past Medical History  Diagnosis Date  . ANXIETY 08/16/2008  . CAD, UNSPECIFIED SITE 10/17/2008  . CHOLELITHIASIS 08/23/2008  . COPD 01/04/2010    FeV1 101%, DLCO64% 2008  . 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 DIVERTICULOSIS-COLON 04/07/2008  . GERD 04/19/2007  . HYPERSOMNIA, ASSOCIATED WITH SLEEP APNEA 02/01/2007  . HYPERTENSION 11/12/2006  . MITRAL VALVE PROLAPSE, HX OF 10/07/2008  . NEOPLASM, MALIGNANT, BREAST, RIGHT 02/15/2008  . OSTEOPOROSIS 04/04/2008  . Raynaud's syndrome 04/25/2009  . Airway obstruction   . History of diverticulitis of colon   . Tortuous colon   . Anal stricture   . Chronic constipation   . Fatty liver   . Hiatal hernia   . Esophageal stricture   . IBS (irritable bowel syndrome)   . Pulmonary fibrosis   . Sleep apnea   . COPD (chronic obstructive pulmonary disease)   . Colitis     sigmoid  . Candida esophagitis   . Diverticulitis   . Hearing aid worn     bilateral  . Atrial fibrillation     she denies known hx of afib 02/24/13  . Complication of anesthesia     difficult intubation     Family History  Problem Relation Age  of Onset  . Parkinsonism Father   . Coronary artery disease Father   . Breast cancer      aunt  . Colon cancer      aunt, age 36  . Uterine cancer      aunt  . Stroke Mother   . Heart failure Mother   . Cancer Maternal Aunt     breast     History   Social History  . Marital Status: Widowed    Spouse Name: N/A    Number of Children: 3  . Years of Education: N/A   Occupational History  . retireed    Social History Main Topics  . Smoking status: Former Smoker -- 2.00 packs/day for 30 years    Types: Cigarettes    Quit date: 05/13/1983  . Smokeless tobacco: Never Used  . Alcohol Use: Yes     Comment: 1-2 glaases wine per day  . Drug Use: No  . Sexual Activity: Not on file   Other Topics  Concern  . Not on file   Social History Narrative   Living in assisted living     Allergies  Allergen Reactions  . Ciprofloxacin Swelling  . Penicillins     REACTION: hives, has had mild doses that she did not have reactions to  . Prednisone   . Sulfamethoxazole     REACTION: Nausea/vomiting     Outpatient Prescriptions Prior to Visit  Medication Sig Dispense Refill  . albuterol (PROVENTIL HFA;VENTOLIN HFA) 108 (90 BASE) MCG/ACT inhaler Inhale 2 puffs into the lungs every 6 (six) hours as needed for wheezing.      . AMBULATORY NON FORMULARY MEDICATION Oxygen at bedtime-2 or 2 1/2 liters      . amLODipine (NORVASC) 2.5 MG tablet Take 2.5 mg by mouth daily.      . cephALEXin (KEFLEX) 500 MG capsule Take 1 capsule (500 mg total) by mouth 3 (three) times daily.  30 capsule  0  . diazepam (VALIUM) 5 MG tablet Take 0.5 tablets (2.5 mg total) by mouth at bedtime.  30 tablet  0  . losartan (COZAAR) 50 MG tablet Take 50 mg by mouth daily.      . mometasone (NASONEX) 50 MCG/ACT nasal spray Place 1 spray into the nose 2 (two) times daily.      . nitroGLYCERIN (NITROSTAT) 0.4 MG SL tablet Place 0.4 mg under the tongue every 5 (five) minutes as needed for chest pain.      Marland Kitchen omeprazole (PRILOSEC) 20 MG capsule Take 20 mg by mouth daily.      . polyethylene glycol (MIRALAX / GLYCOLAX) packet Take 17 g by mouth daily as needed (for constipation).      . promethazine (PHENERGAN) 25 MG tablet Take 1 tablet (25 mg total) by mouth every 4 (four) hours as needed for nausea or vomiting.  60 tablet  2  . Spacer/Aero-Holding Chambers (AEROCHAMBER MV) inhaler Use as instructed  1 each  0  . aspirin 81 MG tablet Take 81 mg by mouth daily.       No facility-administered medications prior to visit.     Review of Systems  As above     Objective:   Physical Exam BP 122/84  Pulse 77  Temp(Src) 97.5 F (36.4 C) (Oral)  Ht 5' 1.5" (1.562 m)  Wt 124 lb 3.2 oz (56.337 kg)  BMI 23.09 kg/m2  SpO2  96%  GEN: A/Ox3; pleasant , NAD, elderly  HEENT:  [place]/AT,  EACs-clear,  TMs-wnl, NOSE-clear, THROAT-clear, no lesions, no postnasal drip or exudate noted.   NECK:  Supple w/ fair ROM; no JVD; normal carotid impulses w/o bruits; no thyromegaly or nodules palpated; no lymphadenopathy.  RESP  Coarse BS w/ no wheezingno accessory muscle use,   CARD:  RRR, no m/r/g  , no peripheral edema, pulses intact, no cyanosis or clubbing.  Musco: Warm bil, no deformities or joint swelling noted.   Neuro: alert, no focal deficits noted.    Skin: Warm, no lesions or rashes         Assessment & Plan:   No problem-specific assessment & plan notes found for this encounter.  This patient was admitted to the hospital see history and physical for full assessment and plan  Updated Medication List Outpatient Encounter Prescriptions as of 03/30/2013  Medication Sig  . albuterol (PROVENTIL HFA;VENTOLIN HFA) 108 (90 BASE) MCG/ACT inhaler Inhale 2 puffs into the lungs every 6 (six) hours as needed for wheezing.  . AMBULATORY NON FORMULARY MEDICATION Oxygen at bedtime-2 or 2 1/2 liters  . amLODipine (NORVASC) 2.5 MG tablet Take 2.5 mg by mouth daily.  Marland Kitchen aspirin 325 MG tablet Take 325 mg by mouth daily.  . cephALEXin (KEFLEX) 500 MG capsule Take 1 capsule (500 mg total) by mouth 3 (three) times daily.  . diazepam (VALIUM) 5 MG tablet Take 0.5 tablets (2.5 mg total) by mouth at bedtime.  Marland Kitchen losartan (COZAAR) 50 MG tablet Take 50 mg by mouth daily.  . mometasone (NASONEX) 50 MCG/ACT nasal spray Place 1 spray into the nose 2 (two) times daily.  . nitroGLYCERIN (NITROSTAT) 0.4 MG SL tablet Place 0.4 mg under the tongue every 5 (five) minutes as needed for chest pain.  Marland Kitchen omeprazole (PRILOSEC) 20 MG capsule Take 20 mg by mouth daily.  . polyethylene glycol (MIRALAX / GLYCOLAX) packet Take 17 g by mouth daily as needed (for constipation).  . promethazine (PHENERGAN) 25 MG tablet Take 1 tablet (25 mg total) by mouth  every 4 (four) hours as needed for nausea or vomiting.  Marland Kitchen Spacer/Aero-Holding Chambers (AEROCHAMBER MV) inhaler Use as instructed  . [DISCONTINUED] aspirin 81 MG tablet Take 81 mg by mouth daily.

## 2013-03-30 NOTE — Evaluation (Signed)
Occupational Therapy Evaluation Patient Details Name: Rosey Eide MRN: [id number] DOB: [birth year] Today's Date: 03/30/2013 Time: 3086-5784 OT Time Calculation (min): 12 min  OT Assessment / Plan / Recommendation History of present illness pt was recently admitted and d/c for CAP.  She returned for weakness and fell and hit head in parking lot when she returned to [facility].  She has a h/o COPD, recurrent uti's, CAD and lung nodules.   Clinical Impression   Pt was admitted for the above.  She will benefit from skilled OT to increase safety and independence with adls.  Pt is usually mod I with adls and is overall min A at this time.  Goals in acute are for supervision level.      OT Assessment  Patient needs continued OT Services    Follow Up Recommendations  SNF (depending upon progress: pt from indep. living)    Barriers to Discharge      Equipment Recommendations  3 in 1 bedside comode    Recommendations for Other Services    Frequency  Min 2X/week    Precautions / Restrictions Precautions Precautions: Fall Restrictions Weight Bearing Restrictions: No   Pertinent Vitals/Pain Pain at back of head, neck, back, and shoulders.  Larey Seat in parking hospital parking lot.      ADL  Grooming: Set up Where Assessed - Grooming: Unsupported sitting Upper Body Bathing: Minimal assistance Where Assessed - Upper Body Bathing: Unsupported sitting Lower Body Bathing: Minimal assistance Where Assessed - Lower Body Bathing: Supported sit to stand Upper Body Dressing: Minimal assistance Where Assessed - Upper Body Dressing: Unsupported sitting Lower Body Dressing: Minimal assistance Where Assessed - Lower Body Dressing: Supported sit to stand Equipment Used:  (4 wheel walker) Transfers/Ambulation Related to ADLs: Pt agreeable to OT eval but fatiques easily.  She requested back to supine once she sat at EOB--pt was agreeable to side stepping up to HOB--min A overall ADL Comments: Pt is able to cross  legs to reach feet:  decreased endurance for adls at this time.      OT Diagnosis: Generalized weakness  OT Problem List: Decreased strength;Decreased activity tolerance;Impaired balance (sitting and/or standing);Pain;Decreased range of motion;Decreased safety awareness OT Treatment Interventions: Self-care/ADL training;DME and/or AE instruction;Patient/family education;Balance training;Therapeutic activities;Therapeutic exercise;Energy conservation   OT Goals(Current goals can be found in the care plan section) Acute Rehab OT Goals Patient Stated Goal: none stated.  Agreeable to OT OT Goal Formulation: With patient Time For Goal Achievement: 04/13/13 Potential to Achieve Goals: Good ADL Goals Pt Will Perform Upper Body Bathing: with set-up;sitting Pt Will Perform Lower Body Bathing: with supervision;sit to/from stand Pt Will Perform Upper Body Dressing: with set-up;sitting Pt Will Perform Lower Body Dressing: with supervision;sit to/from stand Pt Will Transfer to Toilet: with supervision;ambulating;bedside commode Pt Will Perform Toileting - Clothing Manipulation and hygiene: with supervision;sit to/from stand Additional ADL Goal #1: pt will gather clothes at supervision level with RW  Visit Information  Last OT Received On: 03/30/13 Assistance Needed: +1 History of Present Illness: pt was recently admitted and d/c for CAP.  She returned for weakness and fell and hit head in parking lot when she returned to [facility].  She has a h/o COPD, recurrent uti's, CAD and lung nodules.       Prior Functioning     Home Living Family/patient expects to be discharged to:: Other (Comment) (independent living:  meals in dining room) Living Arrangements: Alone Home Equipment: Dan Humphreys - 4 wheels Additional Comments: from [employer]; low commode; walk  in shower no seat nor grab bars Prior Function Level of Independence: Independent with assistive device(s) Communication Communication: No  difficulties         Vision/Perception     Cognition  Cognition Arousal/Alertness: Awake/alert Behavior During Therapy: WFL for tasks assessed/performed Overall Cognitive Status: Within Functional Limits for tasks assessed    Extremity/Trunk Assessment Upper Extremity Assessment Upper Extremity Assessment: Generalized weakness (sore from fall--can lift both to 90* without pain)     Mobility Bed Mobility Supine to Sit: 4: Min assist Sit to Supine: 4: Min assist Details for Bed Mobility Assistance: used bed rail: cued for rolling as back has been sore Transfers Sit to Stand: 4: Min assist Stand to Sit: 4: Min guard Details for Transfer Assistance: cues for hand placement and safety (lock RW)     Exercise     Balance     End of Session OT - End of Session Activity Tolerance: Patient limited by fatigue Patient left: in bed;with call bell/phone within reach;with bed alarm set  GO     Lisha Vitale 03/30/2013, 4:22 PM Marica Otter, OTR/L [phone] 03/30/2013

## 2013-03-31 DIAGNOSIS — Z9181 History of falling: Secondary | ICD-10-CM

## 2013-03-31 DIAGNOSIS — J438 Other emphysema: Secondary | ICD-10-CM | POA: Diagnosis not present

## 2013-03-31 DIAGNOSIS — J189 Pneumonia, unspecified organism: Secondary | ICD-10-CM | POA: Diagnosis not present

## 2013-03-31 LAB — CBC
HCT: 35.2 % — ABNORMAL LOW (ref 36.0–46.0)
Hemoglobin: 11.9 g/dL — ABNORMAL LOW (ref 12.0–15.0)
MCH: 28.7 pg (ref 26.0–34.0)
MCHC: 33.8 g/dL (ref 30.0–36.0)
RDW: 14.9 % (ref 11.5–15.5)

## 2013-03-31 LAB — BASIC METABOLIC PANEL
BUN: 5 mg/dL — ABNORMAL LOW (ref 6–23)
CO2: 24 mEq/L (ref 19–32)
Chloride: 104 mEq/L (ref 96–112)
Creatinine, Ser: 0.66 mg/dL (ref 0.50–1.10)
Glucose, Bld: 110 mg/dL — ABNORMAL HIGH (ref 70–99)
Potassium: 3.4 mEq/L — ABNORMAL LOW (ref 3.5–5.1)

## 2013-03-31 LAB — LEGIONELLA ANTIGEN, URINE

## 2013-03-31 MED ORDER — AMLODIPINE BESYLATE 2.5 MG PO TABS
2.5000 mg | ORAL_TABLET | Freq: Every day | ORAL | Status: DC
  Administered 2013-03-31: 2.5 mg via ORAL
  Filled 2013-03-31 (×2): qty 1

## 2013-03-31 MED ORDER — HEPARIN SODIUM (PORCINE) 5000 UNIT/ML IJ SOLN
5000.0000 [IU] | Freq: Three times a day (TID) | INTRAMUSCULAR | Status: DC
  Administered 2013-03-31 – 2013-04-01 (×4): 5000 [IU] via SUBCUTANEOUS
  Filled 2013-03-31 (×6): qty 1

## 2013-03-31 MED ORDER — POTASSIUM CHLORIDE CRYS ER 20 MEQ PO TBCR
20.0000 meq | EXTENDED_RELEASE_TABLET | Freq: Once | ORAL | Status: AC
  Administered 2013-03-31: 20 meq via ORAL
  Filled 2013-03-31: qty 1

## 2013-03-31 MED ORDER — ASPIRIN 325 MG PO TABS
325.0000 mg | ORAL_TABLET | Freq: Every day | ORAL | Status: DC
  Administered 2013-03-31 – 2013-04-01 (×2): 325 mg via ORAL
  Filled 2013-03-31 (×2): qty 1

## 2013-03-31 MED ORDER — PANTOPRAZOLE SODIUM 40 MG PO TBEC
40.0000 mg | DELAYED_RELEASE_TABLET | Freq: Every day | ORAL | Status: DC
  Administered 2013-03-31 – 2013-04-01 (×2): 40 mg via ORAL
  Filled 2013-03-31: qty 1

## 2013-03-31 MED ORDER — AZITHROMYCIN 500 MG PO TABS
500.0000 mg | ORAL_TABLET | Freq: Every day | ORAL | Status: DC
  Administered 2013-03-31 – 2013-04-01 (×2): 500 mg via ORAL
  Filled 2013-03-31 (×2): qty 1

## 2013-03-31 MED ORDER — METHYLPREDNISOLONE SODIUM SUCC 125 MG IJ SOLR
60.0000 mg | Freq: Four times a day (QID) | INTRAMUSCULAR | Status: DC
  Administered 2013-03-31 – 2013-04-01 (×4): 60 mg via INTRAVENOUS
  Filled 2013-03-31 (×9): qty 0.96

## 2013-03-31 MED ORDER — LOSARTAN POTASSIUM 50 MG PO TABS
50.0000 mg | ORAL_TABLET | Freq: Every day | ORAL | Status: DC
  Administered 2013-03-31: 50 mg via ORAL
  Filled 2013-03-31 (×2): qty 1

## 2013-03-31 MED ORDER — FLUTICASONE PROPIONATE 50 MCG/ACT NA SUSP
2.0000 | Freq: Every day | NASAL | Status: DC
  Administered 2013-03-31 – 2013-04-01 (×2): 2 via NASAL
  Filled 2013-03-31: qty 16

## 2013-03-31 MED ORDER — ENSURE COMPLETE PO LIQD
237.0000 mL | Freq: Two times a day (BID) | ORAL | Status: DC
  Administered 2013-04-01: 237 mL via ORAL

## 2013-03-31 NOTE — Progress Notes (Signed)
PULMONARY  / CRITICAL CARE MEDICINE  Name: [name] [name] MRN: [id number] DOB: 11-30-1923    ADMISSION DATE:  03/30/2013  PRIMARY SERVICE:  PCCM/Shaima Sardinas  CHIEF COMPLAINT:   Weakness, s/p fall, hit head, recent admission, fever  BRIEF PATIENT DESCRIPTION:  79 year old female with recent admission for fever with a negative evaluation but in retrospect likely had left lower lobe community acquired pneumonia. Now being readmitted for same plan for treatment. Note while in process of admission the patient fell in the parking lot and hit her head and this will be evaluated as well  SIGNIFICANT EVENTS / STUDIES:  CT HEAD 11/19>>NEG  LINES / TUBES: Peripheral IVs  CULTURES: Blood cultures x 2 03/30/2013 Sputum culture 03/30/2013 Legionella urine study 03/30/2013 Pneumococcal urine study 03/30/2013>>NEG Viral antigens 03/30/2013  ANTIBIOTICS: Rocephin 03/30/13 Azithromycin 03/30/2013>>change to PO 11/20  SUBJECTIVE:  Less nausea, did not sleep.  Non prod cough  VITAL SIGNS: Temp:  [97.4 F (36.3 C)-98.7 F (37.1 C)] 98.3 F (36.8 C) (11/20 0455) Pulse Rate:  [71-83] 73 (11/20 0455) Resp:  [16-20] 20 (11/20 0455) BP: (116-147)/(53-84) 116/53 mmHg (11/20 0455) SpO2:  [92 %-100 %] 97 % (11/20 0455) Weight:  [55.3 kg (121 lb 14.6 oz)-56.337 kg (124 lb 3.2 oz)] 55.3 kg (121 lb 14.6 oz) (11/19 1303)  PHYSICAL EXAMINATION: General:  Sleepy white female in no acute distress Neuro:  Awake and alert no focal deficits HEENT:  Mucous membranes clear no lesions Neck:  Neck supple no thyromegaly no jugular venous distention Cardiovascular:  Regular rate and rhythm without S3 normal S1-S2 no murmur rub heave or gallop Lungs:  Rales left lower lobe half the way up , few R basilar dry rales , distant breath sounds Abdomen:  Soft nontender bowel sounds active no organomegaly Musculoskeletal:  Full range of motion no joint deformity Skin:  Clear   Recent Labs Lab 03/30/13 1338 03/31/13 0345   NA 138 137  K 4.3 3.4*  CL 103 104  CO2 26 24  BUN 6 5*  CREATININE 0.76 0.66  GLUCOSE 103* 110*    Recent Labs Lab 03/30/13 1338 03/31/13 0345  HGB 12.4 11.9*  HCT 37.0 35.2*  WBC 6.2 6.9  PLT 232 206   Dg Chest 2 View  03/30/2013   CLINICAL DATA:  Recent history of pneumonia, also history of pulmonary fibrosis  EXAM: CHEST  2 VIEW  COMPARISON:  Chest x-ray of 03/17/2013  FINDINGS: Scarring in the left mid upper lung field appears stable. No active infiltrate or effusion is seen. Cardiomegaly is stable. The bones are osteopenic. Surgical clips overlie the right axilla.  IMPRESSION: Stable mild cardiomegaly. Stable linear scarring in the left lung. No active lung disease.   Electronically Signed   By: Dwyane Dee M.D.   On: 03/30/2013 14:57   Ct Head Wo Contrast  03/30/2013   CLINICAL DATA:  Fall.  Headache.  Breast cancer.  EXAM: CT HEAD WITHOUT CONTRAST  TECHNIQUE: Contiguous axial images were obtained from the base of the skull through the vertex without intravenous contrast.  COMPARISON:  03/19/2005.  FINDINGS: Partial opacification mastoid air cells bilaterally without fracture identified. Overall, no skull fracture or intracranial hemorrhage is noted.  Prominent small vessel disease type changes without CT evidence of large acute infarct. Atrophy with ventricular prominence similar to that prior exam. Cavum septum pellucidum et vergae once again noted.  No intracranial mass lesion detected on this unenhanced exam.  Vascular calcifications.  IMPRESSION: Partial opacification mastoid air cells bilaterally without  fracture identified. Overall, no skull fracture or intracranial hemorrhage is noted.  Prominent small vessel disease type changes without CT evidence of large acute infarct. Atrophy with ventricular prominence similar to that prior exam. Cavum septum pellucidum et vergae once again noted.  No intracranial mass lesion detected on this unenhanced exam.   Electronically Signed    By: Bridgett Larsson M.D.   On: 03/30/2013 14:32    ASSESSMENT / PLAN:  #1 left lower lobe community acquired pneumonia organism not specified.  Neg pneumococcal urine antigen  Plan   Cont IV Rocephin and change azithromycin to PO    F/u  cultures to include blood cultures and      sputum culture along with viral     #2   gold stage A COPD with exacerbation   Plan   Bronchodilator therapy   Flutter   Add IV  Steroids   Add PPI   Heparin SQ for DVT proph   IS   mobilize  #3  fall [place] lot [place] to the hospital with no harm injury   PT /OT consults   High Fall risk  Caryl Bis  [phone]  Cell  [phone]  If no response or cell goes to voicemail, call beeper [phone]  Pulmonary and Critical Care Medicine [facility] Pager: [phone]  03/31/2013, 8:11 AM

## 2013-03-31 NOTE — Care Management Note (Signed)
   CARE MANAGEMENT NOTE 03/31/2013  Patient:  LISL, [place]   Account Number:  [id number]  Date Initiated:  03/31/2013  Documentation initiated by:  Lacy Sofia  Subjective/Objective Assessment:   79 yo female admitted with CAP. PCP: Judie Petit, MD.     Action/Plan:   Home when stable   Anticipated DC Date:     Anticipated DC Plan:  SKILLED NURSING FACILITY      DC Planning Services  CM consult      Choice offered to / List presented to:  NA   DME arranged  NA      DME agency  NA     HH arranged  NA      HH agency  NA   Status of service:  In process, will continue to follow Medicare Important Message given?   (If response is "NO", the following Medicare IM given date fields will be blank) Date Medicare IM given:   Date Additional Medicare IM given:    Discharge Disposition:    Per UR Regulation:  Reviewed for med. necessity/level of care/duration of stay  If discussed at Long Length of Stay Meetings, dates discussed:    Comments:  03/31/13 1316 Khaylee Mcevoy,RN,MSN [phone] Chart reviewed for utilization of services. Awaiting PT/OT eval to further assess dc needs.

## 2013-03-31 NOTE — Progress Notes (Signed)
INITIAL NUTRITION ASSESSMENT  Pt meets criteria for severe MALNUTRITION in the context of chronic illness as evidenced by <75% estimated energy intake with 8.3% weight loss in the past month.  DOCUMENTATION CODES Per approved criteria  -Severe malnutrition in the context of chronic illness   INTERVENTION: - Ensure Complete BID - Educated pt on strategies to improve nutritional status such as getting on a regular meal/snack schedule and stocking pt's room with food items she enjoys eating - If poor appetite persists, recommend MD consider appetite stimulant - Will continue to monitor   NUTRITION DIAGNOSIS: Inadequate oral intake related to poor appetite as evidenced by 10% meal intake.   Goal: Pt to consume >90% of meals/supplements  Monitor:  Weights, labs, intake  Reason for Assessment: Nutrition risk   79 y.o. female  Admitting Dx: CAP (community acquired pneumonia)  ASSESSMENT: Admitted with weakness, s/p fall where she hit her head, and fever. Found to have pneumonia.   Met with pt who reports no appetite for the past month. States she does not like the food served at [employer] where she lives. Reports 10 pound unintended weight loss in the past 2 months, actually found to be 11 pounds in the past month. Reports she has snacks in her room but she doesn't want to eat those either. States when she eats things like muffins it turns into glue in her mouth. Denies any changes in medications. Denies any problems chewing/swallowing. Reports having nausea for the past 6 weeks but denies any vomiting. C/o feeling weak PTA. Does not eat breakfast and sometimes only 1 meal/day depending on what food is served.   Potassium slightly low, getting oral replacement.   Height: Ht Readings from Last 1 Encounters:  03/30/13 5' 1.5" (1.562 m)    Weight: Wt Readings from Last 1 Encounters:  03/30/13 121 lb 14.6 oz (55.3 kg)    Ideal Body Weight: 110 lb   % Ideal Body Weight:  110%  Wt Readings from Last 10 Encounters:  03/30/13 121 lb 14.6 oz (55.3 kg)  03/30/13 124 lb 3.2 oz (56.337 kg)  03/14/13 128 lb 15.5 oz (58.5 kg)  03/04/13 130 lb (58.968 kg)  02/24/13 132 lb 1 oz (59.903 kg)  01/04/13 129 lb 3.2 oz (58.605 kg)  11/22/12 130 lb (58.968 kg)  11/18/12 131 lb (59.421 kg)  11/18/12 128 lb (58.06 kg)  10/11/12 136 lb (61.689 kg)    Usual Body Weight: 125-130 lb   % Usual Body Weight: 93-97%  BMI:  Body mass index is 22.67 kg/(m^2).  Estimated Nutritional Needs: Kcal: 1400-1600 Protein: 55-65g Fluid: 1.4-1.6L/day  Skin: Intact   Diet Order: Cardiac  EDUCATION NEEDS: -No education needs identified at this time   Intake/Output Summary (Last 24 hours) at 03/31/13 1222 Last data filed at 03/31/13 0850  Gross per 24 hour  Intake   1415 ml  Output    900 ml  Net    515 ml    Last BM: 11/19  Labs:   Recent Labs Lab 03/30/13 1338 03/31/13 0345  NA 138 137  K 4.3 3.4*  CL 103 104  CO2 26 24  BUN 6 5*  CREATININE 0.76 0.66  CALCIUM 9.4 8.5  MG 1.9  --   PHOS 3.1  --   GLUCOSE 103* 110*    CBG (last 3)  No results found for this basename: GLUCAP,  in the last 72 hours  Scheduled Meds: . amLODipine  2.5 mg Oral Daily  .  aspirin  325 mg Oral Daily  . azithromycin  500 mg Oral Daily  . cefTRIAXone (ROCEPHIN)  IV  1 g Intravenous Q24H  . fluticasone  2 spray Each Nare Daily  . heparin subcutaneous  5,000 Units Subcutaneous Q8H  . losartan  50 mg Oral Daily  . methylPREDNISolone (SOLU-MEDROL) injection  60 mg Intravenous Q6H  . pantoprazole  40 mg Oral Q1200    Continuous Infusions:   Past Medical History  Diagnosis Date  . ANXIETY 08/16/2008  . CAD, UNSPECIFIED SITE 10/17/2008  . CHOLELITHIASIS 08/23/2008  . COPD 01/04/2010    FeV1 101%, DLCO64% 2008  . 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 DIVERTICULOSIS-COLON 04/07/2008  . GERD 04/19/2007  . HYPERSOMNIA, ASSOCIATED WITH SLEEP APNEA 02/01/2007  .  HYPERTENSION 11/12/2006  . MITRAL VALVE PROLAPSE, HX OF 10/07/2008  . NEOPLASM, MALIGNANT, BREAST, RIGHT 02/15/2008  . OSTEOPOROSIS 04/04/2008  . Raynaud's syndrome 04/25/2009  . Airway obstruction   . History of diverticulitis of colon   . Tortuous colon   . Anal stricture   . Chronic constipation   . Fatty liver   . Hiatal hernia   . Esophageal stricture   . IBS (irritable bowel syndrome)   . Pulmonary fibrosis   . Sleep apnea   . COPD (chronic obstructive pulmonary disease)   . Colitis     sigmoid  . Candida esophagitis   . Diverticulitis   . Hearing aid worn     bilateral  . Atrial fibrillation     she denies known hx of afib 02/24/13  . Complication of anesthesia     difficult intubation    Past Surgical History  Procedure Laterality Date  . Abdominal hysterectomy    . Bilateral salpingoophorectomy      s/p prolapsed bladder  . Bladder surgery      prolapsed  . Thoracotomy      granuloma, pulmonary-thoracotomy  . Breast surgery  07-21-07    mastectomy (right), all margins neg and LNs neg   . Appendectomy    . Tonsillectomy    . Rotator cuff repair    . Mastectomy      right  . Breast cyst excision      left  . Mouth surgery    . Eye surgery      cataract removal bilateral  . Melanoma excision      rt clavicle Dr Hettie Holstein office)  . Lung surgery      Levon Hedger MS, RD, LDN [phone] Pager [phone] After Hours Pager

## 2013-03-31 NOTE — Evaluation (Signed)
Physical Therapy Evaluation Patient Details Name: [name] [name] MRN: [id number] DOB: 04/18/1924 Today's Date: 03/31/2013 Time: 4098-1191 PT Time Calculation (min): 11 min  PT Assessment / Plan / Recommendation History of Present Illness  79 year old female with recent admission for fever with a negative evaluation but in retrospect likely had left lower lobe community acquired pneumonia. Now being readmitted for same plan for treatment. Note while in process of admission the patient fell in the parking lot and hit her head.  CT head 11/19 NEG  Clinical Impression  Pt admitted with above. Pt currently with functional limitations due to the deficits listed below (see PT Problem List).  Pt will benefit from skilled PT to increase their independence and safety with mobility to allow discharge to the venue listed below.  Pt reports recently discharged from hospital however she and her daughter hit a twig while she was sitting on her rollator and both fell to the ground just prior to admission.  Pt reports filling out paperwork with [facility] upon last admission discharge however did not yet start therapy.  Pt states she has no hx of falls.     PT Assessment  Patient needs continued PT services    Follow Up Recommendations  Home health PT (pt reports setting up with [facility] last admission)    Does the patient have the potential to tolerate intense rehabilitation      Barriers to Discharge        Equipment Recommendations  None recommended by PT    Recommendations for Other Services     Frequency Min 3X/week    Precautions / Restrictions Precautions Precautions: Fall   Pertinent Vitals/Pain No O2  applied upon entering and resting SaO2 room air 92% SaO2 during ambulation average 95% room air and upon return to sitting in recliner      Mobility  Bed Mobility Bed Mobility: Supine to Sit Supine to Sit: 4: Min assist;HOB elevated Details for Bed Mobility Assistance: used bed rail, HOB  elevated, assist for trunk required Transfers Transfers: Sit to Stand;Stand to Sit Sit to Stand: 4: Min guard;With upper extremity assist;From bed Stand to Sit: 4: Min guard;With upper extremity assist;To chair/3-in-1 Details for Transfer Assistance: cues for hand placement and safety, PT locked walker has pt reports she knows she should but doesn't when cued, also forgot brakes upon sitting Ambulation/Gait Ambulation/Gait Assistance: 4: Min guard Ambulation Distance (Feet): 80 Feet Assistive device: Rollator Ambulation/Gait Assistance Details: pt reports weak LEs limiting distance, states dining hall very far away from her room, pt ambulated on room air with average sats 95% Gait Pattern: Step-through pattern;Decreased step length - right Gait velocity: decreased    Exercises     PT Diagnosis: Difficulty walking;Generalized weakness  PT Problem List: Decreased strength;Decreased activity tolerance;Decreased mobility;Decreased balance;Decreased knowledge of use of DME PT Treatment Interventions: Gait training;Functional mobility training;Therapeutic activities;Therapeutic exercise;Patient/family education;DME instruction     PT Goals(Current goals can be found in the care plan section) Acute Rehab PT Goals Patient Stated Goal: agreeable to PT, wants to get stronger PT Goal Formulation: With patient Time For Goal Achievement: 04/14/13 Potential to Achieve Goals: Good  Visit Information  Last PT Received On: 03/31/13 Assistance Needed: +1 History of Present Illness: 79 year old female with recent admission for fever with a negative evaluation but in retrospect likely had left lower lobe community acquired pneumonia. Now being readmitted for same plan for treatment. Note while in process of admission the patient fell in the parking lot and hit her head.  CT head 11/19 NEG       Prior Functioning  Home Living Family/patient expects to be discharged to:: Other (Comment) (Independent  Living) Living Arrangements: Alone Home Equipment: Walker - 4 wheels;Other (comment) (oxygen) Additional Comments: from [place]; low commode; walk in shower no seat nor grab bars Prior Function Level of Independence: Independent with assistive device(s) Comments: uses 4 wheeled walker to walk to dining room but this is getting tiring, plans to order a motorized scooter. Independent with bathing/dressing.  Communication Communication: No difficulties    Cognition  Cognition Arousal/Alertness: Awake/alert Behavior During Therapy: WFL for tasks assessed/performed Overall Cognitive Status: Within Functional Limits for tasks assessed    Extremity/Trunk Assessment Lower Extremity Assessment Lower Extremity Assessment: Generalized weakness   Balance    End of Session PT - End of Session Activity Tolerance: Patient limited by fatigue Patient left: in chair;with call bell/phone within reach  GP     [facility] 03/31/2013, 2:40 PM Zenovia Jarred, PT, DPT 03/31/2013 Pager: [phone]

## 2013-04-01 DIAGNOSIS — J189 Pneumonia, unspecified organism: Secondary | ICD-10-CM | POA: Diagnosis not present

## 2013-04-01 DIAGNOSIS — J438 Other emphysema: Secondary | ICD-10-CM | POA: Diagnosis not present

## 2013-04-01 DIAGNOSIS — E43 Unspecified severe protein-calorie malnutrition: Secondary | ICD-10-CM | POA: Insufficient documentation

## 2013-04-01 LAB — URINALYSIS, ROUTINE W REFLEX MICROSCOPIC
Glucose, UA: NEGATIVE mg/dL
Hgb urine dipstick: NEGATIVE
Leukocytes, UA: NEGATIVE
Nitrite: NEGATIVE
Specific Gravity, Urine: 1.02 (ref 1.005–1.030)
Urobilinogen, UA: 0.2 mg/dL (ref 0.0–1.0)
pH: 6 (ref 5.0–8.0)

## 2013-04-01 MED ORDER — LEVOFLOXACIN 500 MG PO TABS
500.0000 mg | ORAL_TABLET | Freq: Every day | ORAL | Status: DC
  Administered 2013-04-01: 500 mg via ORAL
  Filled 2013-04-01: qty 1

## 2013-04-01 MED ORDER — ALBUTEROL SULFATE (5 MG/ML) 0.5% IN NEBU: 2.5000 mg | INHALATION_SOLUTION | Freq: Once | RESPIRATORY_TRACT | Status: DC

## 2013-04-01 MED ORDER — LEVOFLOXACIN 500 MG PO TABS: 500.0000 mg | ORAL_TABLET | Freq: Every day | ORAL | Status: DC

## 2013-04-01 MED ORDER — AZITHROMYCIN 500 MG PO TABS: 500.0000 mg | ORAL_TABLET | Freq: Every day | ORAL | Status: DC

## 2013-04-01 MED ORDER — ALBUTEROL SULFATE (5 MG/ML) 0.5% IN NEBU
2.5000 mg | INHALATION_SOLUTION | Freq: Four times a day (QID) | RESPIRATORY_TRACT | Status: DC
  Administered 2013-04-01: 2.5 mg via RESPIRATORY_TRACT
  Filled 2013-04-01: qty 0.5

## 2013-04-01 MED ORDER — PREDNISONE 10 MG PO TABS: ORAL_TABLET | ORAL | Status: DC

## 2013-04-01 MED ORDER — METHYLPREDNISOLONE SODIUM SUCC 40 MG IJ SOLR: 40.0000 mg | Freq: Two times a day (BID) | INTRAMUSCULAR | Status: DC

## 2013-04-01 MED ORDER — IPRATROPIUM BROMIDE 0.02 % IN SOLN
0.5000 mg | Freq: Four times a day (QID) | RESPIRATORY_TRACT | Status: DC
  Administered 2013-04-01: 0.5 mg via RESPIRATORY_TRACT
  Filled 2013-04-01: qty 2.5

## 2013-04-01 NOTE — Progress Notes (Signed)
04/01/13 1813 Patient daughter requesting home PT be ordered. Patient currently uses [place].

## 2013-04-01 NOTE — Discharge Summary (Signed)
Physician Discharge Summary  Patient ID: [name] [name] MRN: [id number] DOB/AGE: May 30, 1923 79 y.o.  Admit date: 03/30/2013 Discharge date: 04/01/2013  Problem List Principal Problem:   CAP (community acquired pneumonia) Active Problems:   COPD with emphysema, gold stage A   GERD   Hypokalemia   At high risk for falls   Protein-calorie malnutrition, severe  HPI: This is an 79 year old white female with gold stage A COPD 2 history admitted to the hospital for evaluation of left lower lobe pneumonia. This patient had been admitted between the third and sixth of November for fever of unclear origin. Extensive evaluation during that admission did demonstrate abnormalities in the left lower lobe but this was not confirmed and the patient was felt to have primarily a urinary tract infection. Note the urine cultures were negative and bloodThis is an 79 year old white female cultures were negative. This patient was subsequently sent home off all antibiotics. She was seen in followup by her primary care provider who did prescribe a course of Keflex for persisting symptoms. She did have a fever up to 102 but is now reduced to normal. She does remain extremely weak. She has had ongoing shortness of breath and cough productive of thick yellow mucus. The patient has a chest tightness symptoms. The patient's symptoms have progressed and she is now being readmitted for further evaluation. Note upon further review her CT scan of her chest did demonstrate pleural-based infiltrates in the left lower lobe. Physical exam is also concerning for ongoing pneumonitis left lower lobe.  Note the patient also has nausea and poor appetite. She has not been very ambulatory in her home since discharge.     Hospital Course: SIGNIFICANT EVENTS / STUDIES:  CT HEAD 11/19>>NEG  LINES / TUBES:  Peripheral IVs  CULTURES:  Blood cultures x 2 03/30/2013>>  Sputum culture 03/30/2013>>  Legionella urine study 03/30/2013>>   Pneumococcal urine study 03/30/2013>>NEG  Viral antigens 03/30/2013>>  ANTIBIOTICS:  Rocephin 03/30/13>>  Azithromycin 03/30/2013>>change to PO 11/20  ASSESSMENT / PLAN:  #1 left lower lobe community acquired pneumonia organism not specified. Neg pneumococcal urine antigen  Plan  Change  IV Rocephin to po levaquin  and dc azithromycin to PO F/u cultures to include blood cultures and sputum culture along with viral   #2 gold stage A COPD with exacerbation  Plan  Bronchodilator therapy  Flutter  DC IV Steroids,  Po prednisone taper  IS  mobilize  #3 fall [place] on [place] to the hospital with no harm injury  PT /OT consults  High Fall risk  #4 Lack of appetite.  Hates ensure  May eat anything she wants No megace at this time       Labs at discharge Lab Results  Component Value Date   CREATININE 0.66 03/31/2013   BUN 5* 03/31/2013   NA 137 03/31/2013   K 3.4* 03/31/2013   CL 104 03/31/2013   CO2 24 03/31/2013   Lab Results  Component Value Date   WBC 6.9 03/31/2013   HGB 11.9* 03/31/2013   HCT 35.2* 03/31/2013   MCV 84.8 03/31/2013   PLT 206 03/31/2013   Lab Results  Component Value Date   ALT 10 03/30/2013   AST 20 03/30/2013   ALKPHOS 53 03/30/2013   BILITOT 0.6 03/30/2013   Lab Results  Component Value Date   INR 0.9 07/19/2007    Current radiology studies Dg Chest 2 View  03/30/2013   CLINICAL DATA:  Recent history  of pneumonia, also history of pulmonary fibrosis  EXAM: CHEST  2 VIEW  COMPARISON:  Chest x-ray of 03/17/2013  FINDINGS: Scarring in the left mid upper lung field appears stable. No active infiltrate or effusion is seen. Cardiomegaly is stable. The bones are osteopenic. Surgical clips overlie the right axilla.  IMPRESSION: Stable mild cardiomegaly. Stable linear scarring in the left lung. No active lung disease.   Electronically Signed   By: Dwyane Dee M.D.   On: 03/30/2013 14:57   Ct Head Wo Contrast  03/30/2013    CLINICAL DATA:  Fall.  Headache.  Breast cancer.  EXAM: CT HEAD WITHOUT CONTRAST  TECHNIQUE: Contiguous axial images were obtained from the base of the skull through the vertex without intravenous contrast.  COMPARISON:  03/19/2005.  FINDINGS: Partial opacification mastoid air cells bilaterally without fracture identified. Overall, no skull fracture or intracranial hemorrhage is noted.  Prominent small vessel disease type changes without CT evidence of large acute infarct. Atrophy with ventricular prominence similar to that prior exam. Cavum septum pellucidum et vergae once again noted.  No intracranial mass lesion detected on this unenhanced exam.  Vascular calcifications.  IMPRESSION: Partial opacification mastoid air cells bilaterally without fracture identified. Overall, no skull fracture or intracranial hemorrhage is noted.  Prominent small vessel disease type changes without CT evidence of large acute infarct. Atrophy with ventricular prominence similar to that prior exam. Cavum septum pellucidum et vergae once again noted.  No intracranial mass lesion detected on this unenhanced exam.   Electronically Signed   By: Bridgett Larsson M.D.   On: 03/30/2013 14:32    Disposition:  06-Home-Health Care Svc  Discharge Orders   Future Appointments Provider Department Dept Phone   04/08/2013 10:30 AM Storm Frisk, MD [facility] [phone]   Future Orders Complete By Expires   Discharge patient  As directed    Comments:     Discharge to daughter after 5 pm 11-21       Medication List    STOP taking these medications       cephALEXin 500 MG capsule  Commonly known as:  KEFLEX      TAKE these medications       AEROCHAMBER MV inhaler  Use as instructed     albuterol 108 (90 BASE) MCG/ACT inhaler  Commonly known as:  PROVENTIL HFA;VENTOLIN HFA  Inhale 2 puffs into the lungs every 6 (six) hours as needed for wheezing.     AMBULATORY NON FORMULARY MEDICATION  Oxygen at bedtime-2  or 2 1/2 liters     amLODipine 2.5 MG tablet  Commonly known as:  NORVASC  Take 2.5 mg by mouth daily.     aspirin 325 MG tablet  Take 325 mg by mouth daily.     diazepam 5 MG tablet  Commonly known as:  VALIUM  Take 0.5 tablets (2.5 mg total) by mouth at bedtime.     levofloxacin 500 MG tablet  Commonly known as:  LEVAQUIN  Take 1 tablet (500 mg total) by mouth daily.     losartan 50 MG tablet  Commonly known as:  COZAAR  Take 50 mg by mouth daily.     mometasone 50 MCG/ACT nasal spray  Commonly known as:  NASONEX  Place 1 spray into the nose 2 (two) times daily.     nitroGLYCERIN 0.4 MG SL tablet  Commonly known as:  NITROSTAT  Place 0.4 mg under the tongue every 5 (five) minutes as needed for chest pain.  omeprazole 20 MG capsule  Commonly known as:  PRILOSEC  Take 20 mg by mouth daily.     polyethylene glycol packet  Commonly known as:  MIRALAX / GLYCOLAX  Take 17 g by mouth daily as needed (for constipation).     predniSONE 10 MG tablet  Commonly known as:  DELTASONE  Take 4 tabs  daily with food x 4 days, then 3 tabs daily x 4 days, then 2 tabs daily x 4 days, then 1 tab daily x4 days then stop. #40     promethazine 25 MG tablet  Commonly known as:  PHENERGAN  Take 1 tablet (25 mg total) by mouth every 4 (four) hours as needed for nausea or vomiting.           Follow-up Information   Follow up with Shan Levans, MD On 04/08/2013. (10:30 am)    Specialty:  Pulmonary Disease   Contact information:   [address] [place] [address] [phone]        Discharged Condition: fair  Time spent on discharge greater than 40 minutes.  Vital signs at Discharge. Temp:  [97.7 F (36.5 C)-98.5 F (36.9 C)] 97.8 F (36.6 C) (11/21 0445) Pulse Rate:  [71-78] 74 (11/21 0445) Resp:  [16-20] 16 (11/21 0445) BP: (126-140)/(63-70) 140/70 mmHg (11/21 0445) SpO2:  [93 %-100 %] 100 % (11/21 0445) Office follow up Special Information or  instructions. See Dr. Delford Field as instructed. Signed: Brett Canales Minor ACNP [facility] PCCM Pager [phone] till 3 pm If no answer page [phone] 04/01/2013, 11:21 AM     Agree with above. Discussed with Dr Jeanene Erb, MD ; [facility] [phone].  After 5:30 PM or weekends, call [phone]

## 2013-04-01 NOTE — Progress Notes (Signed)
Call to Primary MD Delford Field in reference to patient reported respiratory difficulty.  Assessment revealed respirations even and unlabored.  Skin warm and dry.  Oxygen saturation 99 % resp 19 pulse 54.  No s/s of distress.  Lungs clear, but diminished bilaterally.  No wheezes auscultated.  MD ordered albuterol neb treatment x1.  In reference to left flank pain MD ordered Urine culture and sensitivity.

## 2013-04-01 NOTE — Progress Notes (Signed)
Patient evaluated for [facility] Care Management services. Spoke with patient at bedside to explain and offer [facility] Care Management services. She reports she has been managing well at home and does not have the need for [facility] Care Management at this time. She accepted [facility] Care Management brochure and states she will call in the future if she thinks she needs [facility] Care Management. Appreciative of visit.  Raiford Noble, MSN-Ed, RN,BSN- [facility] [phone]

## 2013-04-01 NOTE — Progress Notes (Signed)
04/01/13 1802  Reviewed discharge instructions with patient and her daughter.  Both verbalized understanding of discharge instructions. Copy of discharge papers given to patient. Pt/daughter aware to pickup prescriptions at [facility] and stop Keflex.

## 2013-04-01 NOTE — Progress Notes (Signed)
PULMONARY  / CRITICAL CARE MEDICINE  Name: [name] [name] MRN: [id number] DOB: Dec 12, 1923    ADMISSION DATE:  03/30/2013  PRIMARY SERVICE:  PCCM/Wright  CHIEF COMPLAINT:   Weakness, s/p fall, hit head, recent admission, fever  BRIEF PATIENT DESCRIPTION:  79 year old female with recent admission for fever with a negative evaluation but in retrospect likely had left lower lobe community acquired pneumonia. Now being readmitted for same plan for treatment. Note while in process of admission the patient fell in the parking lot and hit her head and this will be evaluated as well  SIGNIFICANT EVENTS / STUDIES:  CT HEAD 11/19>>NEG  LINES / TUBES: Peripheral IVs  CULTURES: Blood cultures x 2 03/30/2013>> Sputum culture 03/30/2013>> Legionella urine study 03/30/2013>> Pneumococcal urine study 03/30/2013>>NEG Viral antigens 03/30/2013>>  ANTIBIOTICS: Rocephin 03/30/13>> Azithromycin 03/30/2013>>change to PO 11/20  SUBJECTIVE:  Complains of no appetite   VITAL SIGNS: Temp:  [97.7 F (36.5 C)-98.5 F (36.9 C)] 97.8 F (36.6 C) (11/21 0445) Pulse Rate:  [71-78] 74 (11/21 0445) Resp:  [16-20] 16 (11/21 0445) BP: (126-140)/(63-70) 140/70 mmHg (11/21 0445) SpO2:  [93 %-100 %] 100 % (11/21 0445)  PHYSICAL EXAMINATION: General: Awake white female in no acute distress Neuro:  Awake and alert no focal deficits HEENT:  Mucous membranes clear no lesions Neck:  Neck supple no thyromegaly no jugular venous distention Cardiovascular:  Regular rate and rhythm without S3 normal S1-S2 no murmur rub heave or gallop Lungs:  Rales left lower base , few R basilar dry rales , distant breath sounds, NO increase in WOB Abdomen:  Soft nontender bowel sounds active no organomegaly Musculoskeletal:  Full range of motion no joint deformity Skin:  Clear   Recent Labs Lab 03/30/13 1338 03/31/13 0345  NA 138 137  K 4.3 3.4*  CL 103 104  CO2 26 24  BUN 6 5*  CREATININE 0.76 0.66  GLUCOSE 103* 110*     Recent Labs Lab 03/30/13 1338 03/31/13 0345  HGB 12.4 11.9*  HCT 37.0 35.2*  WBC 6.2 6.9  PLT 232 206   Dg Chest 2 View  03/30/2013   CLINICAL DATA:  Recent history of pneumonia, also history of pulmonary fibrosis  EXAM: CHEST  2 VIEW  COMPARISON:  Chest x-ray of 03/17/2013  FINDINGS: Scarring in the left mid upper lung field appears stable. No active infiltrate or effusion is seen. Cardiomegaly is stable. The bones are osteopenic. Surgical clips overlie the right axilla.  IMPRESSION: Stable mild cardiomegaly. Stable linear scarring in the left lung. No active lung disease.   Electronically Signed   By: Dwyane Dee M.D.   On: 03/30/2013 14:57   Ct Head Wo Contrast  03/30/2013   CLINICAL DATA:  Fall.  Headache.  Breast cancer.  EXAM: CT HEAD WITHOUT CONTRAST  TECHNIQUE: Contiguous axial images were obtained from the base of the skull through the vertex without intravenous contrast.  COMPARISON:  03/19/2005.  FINDINGS: Partial opacification mastoid air cells bilaterally without fracture identified. Overall, no skull fracture or intracranial hemorrhage is noted.  Prominent small vessel disease type changes without CT evidence of large acute infarct. Atrophy with ventricular prominence similar to that prior exam. Cavum septum pellucidum et vergae once again noted.  No intracranial mass lesion detected on this unenhanced exam.  Vascular calcifications.  IMPRESSION: Partial opacification mastoid air cells bilaterally without fracture identified. Overall, no skull fracture or intracranial hemorrhage is noted.  Prominent small vessel disease type changes without CT evidence of large acute infarct. Atrophy  with ventricular prominence similar to that prior exam. Cavum septum pellucidum et vergae once again noted.  No intracranial mass lesion detected on this unenhanced exam.   Electronically Signed   By: Bridgett Larsson M.D.   On: 03/30/2013 14:32    ASSESSMENT / PLAN:  #1 left lower lobe community  acquired pneumonia organism not specified.  Neg pneumococcal urine antigen  Plan   Cont IV Rocephin and change azithromycin to PO    F/u  cultures to include blood cultures and      sputum culture along with viral              11-21 consider change to PO levaquin    #2   gold stage A COPD with exacerbation   Plan   Bronchodilator therapy   Flutter   Decrease IV  Steroids, consider po taper   Added PPI   Heparin SQ for DVT proph   IS   mobilize  #3  [place] on [place] to the hospital with no harm injury   PT /OT consults   High Fall risk  #4 Lack of appetite.          Hates ensure         Have family bring in outside food.         No megace at this time    [facility] ACNP [facility] PCCM Pager [phone] till 3 pm If no answer page [phone] 04/01/2013, 10:26 AM  PCCM ATTENDING: I have interviewed and examined the patient and reviewed the database. I have formulated the assessment and plan as reflected in the note above with amendments made by me.  Billy Fischer, MD;  PCCM service; Mobile [phone]

## 2013-04-02 LAB — URINE CULTURE

## 2013-04-04 ENCOUNTER — Telehealth: Payer: Self-pay | Admitting: Critical Care Medicine

## 2013-04-04 LAB — ADENOVIRUS ANTIBODIES: Adenovirus Antibody: <1:8 {titer}

## 2013-04-04 NOTE — Telephone Encounter (Signed)
Tell her STOP levaquin, diarrhea is d/t levaquin Finish prednisone Start Florastor or equivalent oral GI medication one three times per day, this is OTC

## 2013-04-04 NOTE — Telephone Encounter (Signed)
I spoke with the pt and she states that she took her medication prescribed at discharge, prednisone and levaquin. She states she took 3 days worth of the meds. She states on the 2nd day she developed severe diarrhea. Pt is concerned about taking any more medication. She states she has taken prednisone many times in the past, but is not sure if she has taken levaquin before. Please advise. [name] [name], CMA Allergies  Allergen Reactions  . Ciprofloxacin Swelling  . Penicillins     REACTION: hives, has had mild doses that she did not have reactions to  . Prednisone   . Sulfamethoxazole     REACTION: Nausea/vomiting

## 2013-04-04 NOTE — Telephone Encounter (Signed)
ATC pt and line rings fast busy signal x 4 wcb

## 2013-04-04 NOTE — Telephone Encounter (Signed)
I called and made pt aware of recs. Nothing further needed 

## 2013-04-05 LAB — CULTURE, BLOOD (ROUTINE X 2): Culture: NO GROWTH

## 2013-04-08 ENCOUNTER — Encounter: Payer: Self-pay | Admitting: Critical Care Medicine

## 2013-04-08 ENCOUNTER — Ambulatory Visit (INDEPENDENT_AMBULATORY_CARE_PROVIDER_SITE_OTHER): Payer: Medicare Other | Admitting: Critical Care Medicine

## 2013-04-08 VITALS — BP 102/78 | HR 85 | Temp 97.6°F | Ht 61.5 in | Wt 124.4 lb

## 2013-04-08 DIAGNOSIS — J189 Pneumonia, unspecified organism: Secondary | ICD-10-CM | POA: Diagnosis not present

## 2013-04-08 DIAGNOSIS — T50905A Adverse effect of unspecified drugs, medicaments and biological substances, initial encounter: Secondary | ICD-10-CM | POA: Insufficient documentation

## 2013-04-08 DIAGNOSIS — T887XXA Unspecified adverse effect of drug or medicament, initial encounter: Secondary | ICD-10-CM | POA: Diagnosis not present

## 2013-04-08 DIAGNOSIS — R5381 Other malaise: Secondary | ICD-10-CM

## 2013-04-08 LAB — LEGIONELLA PNEUMOPHILA TOTAL AB
Serogroup 1: <1:16 {titer}
Serogroups 2,3,4,5,6,8: <1:16 {titer}

## 2013-04-08 MED ORDER — DIPHENHYDRAMINE HCL 25 MG PO CAPS: 25.0000 mg | ORAL_CAPSULE | Freq: Four times a day (QID) | ORAL | Status: DC | PRN

## 2013-04-08 MED ORDER — FAMOTIDINE 20 MG PO TABS: 20.0000 mg | ORAL_TABLET | Freq: Every day | ORAL | Status: DC

## 2013-04-08 NOTE — Progress Notes (Signed)
Subjective:    Patient ID: [name] [name], female    DOB: [birth year], 79 y.o.   MRN: [id number]  HPI  This is a 79 y.o. 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 The pt also has hx of upper airway obstruction with difficult intubation with prior operative procedures. Also hx Breast CA s/p resection and XRT. Hx Gallstone disease with conservative Rx due to difficult intubation status.   03/30/2013 Chief Complaint  Patient presents with  . Follow-up    recently in [facility]  "Feels terrible," no appetite, fatigue, weakness, increased SOB, and chest tightness.  Coughing some - prod at times with yellow mucus.  Recently in hosp, has home PT Fever unclear origin.  CT neg. Prob UTI.  hosp stay 11/3- 11/6  Pt ill with chest tightness, feels like a band, no radiation. No fever now.  Saw PCP sl fever, cephalexin rx.  ?UTI Off and on abx. Notes some cough is productive and is yellow.  Notes some pndrip. Notes more dyspnea.  No energy, wont go for meals.  3weeks. Notes some pndrip and sinus pressure.  No joint issues.   C/o nausea. No eating.  No emesis.  Rx nausea phenegren per pcp  04/08/2013 Chief Complaint  Patient presents with  . HFU    Pt c/o still having alot of weakness and nausea. Pt also states she still has a rash that she thought was from the abx but she has been off of them  x 4 days.  Pt denies any complaints with  breathing.    Upon review of the patient's chart the patient was to be discharged on Levaquin and prednisone but azithromycin was also sent to pharmacy therefore the patient is taking both azithromycin and Levaquin. The patient developed hives nausea and diarrhea. The diarrhea has now resolved.   the patient had been admitted for 3 day to treat slow to resolve left lower lobe pneumonia   overall the patient is now somewhat improved but still quite weak and debilitated. There is still significant itching  occurring in the skin..    Past Medical History  Diagnosis Date  . ANXIETY 08/16/2008  . CAD, UNSPECIFIED SITE 10/17/2008  . CHOLELITHIASIS 08/23/2008  . COPD 01/04/2010    FeV1 101%, DLCO64% 2008  . 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 DIVERTICULOSIS-COLON 04/07/2008  . GERD 04/19/2007  . HYPERSOMNIA, ASSOCIATED WITH SLEEP APNEA 02/01/2007  . HYPERTENSION 11/12/2006  . MITRAL VALVE PROLAPSE, HX OF 10/07/2008  . NEOPLASM, MALIGNANT, BREAST, RIGHT 02/15/2008  . OSTEOPOROSIS 04/04/2008  . Raynaud's syndrome 04/25/2009  . Airway obstruction   . History of diverticulitis of colon   . Tortuous colon   . Anal stricture   . Chronic constipation   . Fatty liver   . Hiatal hernia   . Esophageal stricture   . IBS (irritable bowel syndrome)   . Pulmonary fibrosis   . Sleep apnea   . COPD (chronic obstructive pulmonary disease)   . Colitis     sigmoid  . Candida esophagitis   . Diverticulitis   . Hearing aid worn     bilateral  . Atrial fibrillation     she denies known hx of afib 02/24/13  . Complication of anesthesia     difficult intubation     Family History  Problem Relation Age of Onset  . Parkinsonism Father   . Coronary artery disease Father   . Breast cancer  aunt  . Colon cancer      aunt, age 60  . Uterine cancer      aunt  . Stroke Mother   . Heart failure Mother   . Cancer Maternal Aunt     breast     History   Social History  . Marital Status: Widowed    Spouse Name: N/A    Number of Children: 3  . Years of Education: N/A   Occupational History  . retireed    Social History Main Topics  . Smoking status: Former Smoker -- 2.00 packs/day for 30 years    Types: Cigarettes    Quit date: 05/13/1983  . Smokeless tobacco: Never Used  . Alcohol Use: Yes     Comment: 1-2 glaases wine per day  . Drug Use: No  . Sexual Activity: No   Other Topics Concern  . Not on file   Social History Narrative   Living in assisted living      Allergies  Allergen Reactions  . Ciprofloxacin Swelling  . Levaquin [Levofloxacin In D5w] Hives  . Azithromycin     rash  . Penicillins     REACTION: hives, has had mild doses that she did not have reactions to  . Prednisone   . Sulfamethoxazole     REACTION: Nausea/vomiting     Outpatient Prescriptions Prior to Visit  Medication Sig Dispense Refill  . albuterol (PROVENTIL HFA;VENTOLIN HFA) 108 (90 BASE) MCG/ACT inhaler Inhale 2 puffs into the lungs every 6 (six) hours as needed for wheezing.      . AMBULATORY NON FORMULARY MEDICATION Oxygen at bedtime-2 or 2 1/2 liters      . amLODipine (NORVASC) 2.5 MG tablet Take 2.5 mg by mouth daily.      Marland Kitchen aspirin 325 MG tablet Take 325 mg by mouth daily.      . diazepam (VALIUM) 5 MG tablet Take 0.5 tablets (2.5 mg total) by mouth at bedtime.  30 tablet  0  . losartan (COZAAR) 50 MG tablet Take 50 mg by mouth daily.      . mometasone (NASONEX) 50 MCG/ACT nasal spray Place 1 spray into the nose 2 (two) times daily.      . nitroGLYCERIN (NITROSTAT) 0.4 MG SL tablet Place 0.4 mg under the tongue every 5 (five) minutes as needed for chest pain.      Marland Kitchen omeprazole (PRILOSEC) 20 MG capsule Take 20 mg by mouth daily.      . polyethylene glycol (MIRALAX / GLYCOLAX) packet Take 17 g by mouth daily as needed (for constipation).      . promethazine (PHENERGAN) 25 MG tablet Take 1 tablet (25 mg total) by mouth every 4 (four) hours as needed for nausea or vomiting.  60 tablet  2  . Spacer/Aero-Holding Chambers (AEROCHAMBER MV) inhaler Use as instructed  1 each  0  . predniSONE (DELTASONE) 10 MG tablet Take 4 tabs  daily with food x 4 days, then 3 tabs daily x 4 days, then 2 tabs daily x 4 days, then 1 tab daily x4 days then stop. #40  40 tablet  0  . levofloxacin (LEVAQUIN) 500 MG tablet Take 1 tablet (500 mg total) by mouth daily.  5 tablet  0   No facility-administered medications prior to visit.     Review of Systems  As above     Objective:    Physical Exam BP 102/78  Pulse 85  Temp(Src) 97.6 F (36.4 C) (Oral)  Ht  5' 1.5" (1.562 m)  Wt 124 lb 6.4 oz (56.427 kg)  BMI 23.13 kg/m2  SpO2 97%  GEN: A/Ox3;  , NAD, elderly  HEENT:  [place]/AT,  EACs-clear, TMs-wnl, NOSE-clear, THROAT-clear, no lesions, no postnasal drip or exudate noted.   NECK:  Supple w/ fair ROM; no JVD; normal carotid impulses w/o bruits; no thyromegaly or nodules palpated; no lymphadenopathy.  RESP  Coarse BS w/ no wheezingno accessory muscle use,   CARD:  RRR, no m/r/g  , no peripheral edema, pulses intact, no cyanosis or clubbing.  Musco: Warm bil, no deformities or joint swelling noted.   Neuro: alert, no focal deficits noted.    Skin: Warm, no lesions rash noted over anterior trunk and thorax          Assessment & Plan:   Adverse reaction to drug Adverse reaction to a combination of azithromycin and Levaquin leading to nausea, diarrhea, severe pruritus and hives. It is unclear whether the Levaquin or the unintended azithromycin prescription lead to the patient's symptom complex. The patient was discharged to take Levaquin daily but somehow azithromycin prescription was also sent to the pharmacy and she took both medications.  Plan  No additional antibiotics are indicated and azithromycin has been discontinued already by the patient. She had already finished her course of Levaquin.  The patient will receive Benadryl for itching and Pepcid for the H2 blocking effect. Prednisone was discontinued  CAP (community acquired pneumonia) Severe left lower lobe can be acquired pneumonia now resolved No additional antibiotics indicated   This patient was admitted to the hospital see history and physical for full assessment and plan  Updated Medication List Outpatient Encounter Prescriptions as of 04/08/2013  Medication Sig  . albuterol (PROVENTIL HFA;VENTOLIN HFA) 108 (90 BASE) MCG/ACT inhaler Inhale 2 puffs into the lungs every 6 (six) hours as  needed for wheezing.  . AMBULATORY NON FORMULARY MEDICATION Oxygen at bedtime-2 or 2 1/2 liters  . amLODipine (NORVASC) 2.5 MG tablet Take 2.5 mg by mouth daily.  Marland Kitchen aspirin 325 MG tablet Take 325 mg by mouth daily.  . diazepam (VALIUM) 5 MG tablet Take 0.5 tablets (2.5 mg total) by mouth at bedtime.  Marland Kitchen losartan (COZAAR) 50 MG tablet Take 50 mg by mouth daily.  . mometasone (NASONEX) 50 MCG/ACT nasal spray Place 1 spray into the nose 2 (two) times daily.  . nitroGLYCERIN (NITROSTAT) 0.4 MG SL tablet Place 0.4 mg under the tongue every 5 (five) minutes as needed for chest pain.  Marland Kitchen omeprazole (PRILOSEC) 20 MG capsule Take 20 mg by mouth daily.  . polyethylene glycol (MIRALAX / GLYCOLAX) packet Take 17 g by mouth daily as needed (for constipation).  . promethazine (PHENERGAN) 25 MG tablet Take 1 tablet (25 mg total) by mouth every 4 (four) hours as needed for nausea or vomiting.  Marland Kitchen Spacer/Aero-Holding Chambers (AEROCHAMBER MV) inhaler Use as instructed  . [DISCONTINUED] predniSONE (DELTASONE) 10 MG tablet Take 4 tabs  daily with food x 4 days, then 3 tabs daily x 4 days, then 2 tabs daily x 4 days, then 1 tab daily x4 days then stop. #40  . diphenhydrAMINE (BENADRYL) 25 mg capsule Take 1 capsule (25 mg total) by mouth every 6 (six) hours as needed for itching.  . famotidine (PEPCID) 20 MG tablet Take 1 tablet (20 mg total) by mouth at bedtime.  . [DISCONTINUED] levofloxacin (LEVAQUIN) 500 MG tablet Take 1 tablet (500 mg total) by mouth daily.

## 2013-04-08 NOTE — Patient Instructions (Signed)
Stop azithromycin  Stop prednisone Use benadryl 25mg  every 6 hours as needed for itching Start pepcid 20mg  at bedtime Physical therapy and RN will be consulted from [facility] Get primary care to clean ears out Return 2 weeks for recheck with tammy parrett

## 2013-04-08 NOTE — Assessment & Plan Note (Signed)
Adverse reaction to a combination of azithromycin and Levaquin leading to nausea, diarrhea, severe pruritus and hives. It is unclear whether the Levaquin or the unintended azithromycin prescription lead to the patient's symptom complex. The patient was discharged to take Levaquin daily but somehow azithromycin prescription was also sent to the pharmacy and she took both medications.  Plan  No additional antibiotics are indicated and azithromycin has been discontinued already by the patient. She had already finished her course of Levaquin.  The patient will receive Benadryl for itching and Pepcid for the H2 blocking effect. Prednisone was discontinued

## 2013-04-08 NOTE — Assessment & Plan Note (Signed)
Severe left lower lobe can be acquired pneumonia now resolved No additional antibiotics indicated

## 2013-04-09 DIAGNOSIS — Z792 Long term (current) use of antibiotics: Secondary | ICD-10-CM | POA: Diagnosis not present

## 2013-04-09 DIAGNOSIS — R269 Unspecified abnormalities of gait and mobility: Secondary | ICD-10-CM | POA: Diagnosis not present

## 2013-04-09 DIAGNOSIS — I2510 Atherosclerotic heart disease of native coronary artery without angina pectoris: Secondary | ICD-10-CM | POA: Diagnosis not present

## 2013-04-09 DIAGNOSIS — N390 Urinary tract infection, site not specified: Secondary | ICD-10-CM | POA: Diagnosis not present

## 2013-04-09 DIAGNOSIS — IMO0001 Reserved for inherently not codable concepts without codable children: Secondary | ICD-10-CM | POA: Diagnosis not present

## 2013-04-09 DIAGNOSIS — I10 Essential (primary) hypertension: Secondary | ICD-10-CM | POA: Diagnosis not present

## 2013-04-11 ENCOUNTER — Telehealth: Payer: Self-pay | Admitting: Internal Medicine

## 2013-04-11 NOTE — Telephone Encounter (Signed)
[name] [name] from [facility] would order for home health nurse for eval. Pt had pneumonia

## 2013-04-11 NOTE — Telephone Encounter (Signed)
I spoke with [name] [name] and gave below information.

## 2013-04-11 NOTE — Telephone Encounter (Signed)
She has been seeing Dr. Delford Field for care of her pneumonia and COPD before, during, and after her hospital stay. He is in charge of her pumonary care, and it looks like he has already place an order for home health anyway

## 2013-04-12 DIAGNOSIS — I10 Essential (primary) hypertension: Secondary | ICD-10-CM | POA: Diagnosis not present

## 2013-04-12 DIAGNOSIS — N390 Urinary tract infection, site not specified: Secondary | ICD-10-CM | POA: Diagnosis not present

## 2013-04-12 DIAGNOSIS — R269 Unspecified abnormalities of gait and mobility: Secondary | ICD-10-CM | POA: Diagnosis not present

## 2013-04-12 DIAGNOSIS — I2510 Atherosclerotic heart disease of native coronary artery without angina pectoris: Secondary | ICD-10-CM | POA: Diagnosis not present

## 2013-04-12 DIAGNOSIS — IMO0001 Reserved for inherently not codable concepts without codable children: Secondary | ICD-10-CM | POA: Diagnosis not present

## 2013-04-12 DIAGNOSIS — Z792 Long term (current) use of antibiotics: Secondary | ICD-10-CM | POA: Diagnosis not present

## 2013-04-13 ENCOUNTER — Telehealth: Payer: Self-pay | Admitting: Critical Care Medicine

## 2013-04-13 ENCOUNTER — Telehealth: Payer: Self-pay | Admitting: Internal Medicine

## 2013-04-13 NOTE — Telephone Encounter (Signed)
Gareth Morgan with [facility] called to update you on the patient. She is a [facility] patient but has been seen recently by Dr. Clent Ridges.  From pt's evaluation today, pt has a little swelling and itching on left side of face above eye; pt denies any med changes or even new soaps or lotions.  Nurse advised her to take Benadryl.  Harvie Heck is going back tomorrow to check on patient, he will notify us if anything changes.

## 2013-04-13 NOTE — Telephone Encounter (Signed)
Verbal order has been given.  

## 2013-04-14 DIAGNOSIS — Z792 Long term (current) use of antibiotics: Secondary | ICD-10-CM | POA: Diagnosis not present

## 2013-04-14 DIAGNOSIS — N390 Urinary tract infection, site not specified: Secondary | ICD-10-CM | POA: Diagnosis not present

## 2013-04-14 DIAGNOSIS — I2510 Atherosclerotic heart disease of native coronary artery without angina pectoris: Secondary | ICD-10-CM | POA: Diagnosis not present

## 2013-04-14 DIAGNOSIS — I10 Essential (primary) hypertension: Secondary | ICD-10-CM | POA: Diagnosis not present

## 2013-04-14 DIAGNOSIS — R269 Unspecified abnormalities of gait and mobility: Secondary | ICD-10-CM | POA: Diagnosis not present

## 2013-04-14 DIAGNOSIS — IMO0001 Reserved for inherently not codable concepts without codable children: Secondary | ICD-10-CM | POA: Diagnosis not present

## 2013-04-15 NOTE — Telephone Encounter (Signed)
[name] [name] fu to let know that pt's hives are better. Swelling and itching is better. Left knee has a few spots.  Bp is running low.  100/64

## 2013-04-18 DIAGNOSIS — IMO0001 Reserved for inherently not codable concepts without codable children: Secondary | ICD-10-CM | POA: Diagnosis not present

## 2013-04-18 DIAGNOSIS — I2510 Atherosclerotic heart disease of native coronary artery without angina pectoris: Secondary | ICD-10-CM | POA: Diagnosis not present

## 2013-04-18 DIAGNOSIS — N390 Urinary tract infection, site not specified: Secondary | ICD-10-CM | POA: Diagnosis not present

## 2013-04-18 DIAGNOSIS — R269 Unspecified abnormalities of gait and mobility: Secondary | ICD-10-CM | POA: Diagnosis not present

## 2013-04-18 DIAGNOSIS — I10 Essential (primary) hypertension: Secondary | ICD-10-CM | POA: Diagnosis not present

## 2013-04-18 DIAGNOSIS — Z792 Long term (current) use of antibiotics: Secondary | ICD-10-CM | POA: Diagnosis not present

## 2013-04-19 ENCOUNTER — Telehealth: Payer: Self-pay | Admitting: Critical Care Medicine

## 2013-04-19 DIAGNOSIS — IMO0001 Reserved for inherently not codable concepts without codable children: Secondary | ICD-10-CM | POA: Diagnosis not present

## 2013-04-19 DIAGNOSIS — R269 Unspecified abnormalities of gait and mobility: Secondary | ICD-10-CM | POA: Diagnosis not present

## 2013-04-19 DIAGNOSIS — I10 Essential (primary) hypertension: Secondary | ICD-10-CM | POA: Diagnosis not present

## 2013-04-19 DIAGNOSIS — N390 Urinary tract infection, site not specified: Secondary | ICD-10-CM | POA: Diagnosis not present

## 2013-04-19 DIAGNOSIS — I2510 Atherosclerotic heart disease of native coronary artery without angina pectoris: Secondary | ICD-10-CM | POA: Diagnosis not present

## 2013-04-19 DIAGNOSIS — Z792 Long term (current) use of antibiotics: Secondary | ICD-10-CM | POA: Diagnosis not present

## 2013-04-19 MED ORDER — METHYLPREDNISOLONE 4 MG PO KIT: PACK | ORAL | Status: DC

## 2013-04-19 MED ORDER — HYDROXYZINE HCL 10 MG PO TABS: 10.0000 mg | ORAL_TABLET | ORAL | Status: DC | PRN

## 2013-04-19 NOTE — Telephone Encounter (Signed)
Per SN---  Medrol dosepak Atarax 10 mg   #50  1 po every 4 hours as needed for itching.

## 2013-04-19 NOTE — Telephone Encounter (Signed)
I called and spoke with pt. She took one tablet of the azithroymycin after thanksgiving and broke out in hives. She reports she still has hives on both of her arms and a break out on her face. She has been taking benadryl. They are itching, red, slightly swollen. Since PW is on night float will forward to DOD for any recs. Please advise SN thanks  Allergies  Allergen Reactions  . Ciprofloxacin Swelling  . Levaquin [Levofloxacin In D5w] Hives  . Azithromycin     rash  . Penicillins     REACTION: hives, has had mild doses that she did not have reactions to  . Prednisone   . Sulfamethoxazole     REACTION: Nausea/vomiting

## 2013-04-19 NOTE — Telephone Encounter (Signed)
i called and spoke with pt. She is aware of recs and RX called in. Will forward to Dr. Delford Field as an Lorain Childes.

## 2013-04-21 DIAGNOSIS — Z792 Long term (current) use of antibiotics: Secondary | ICD-10-CM | POA: Diagnosis not present

## 2013-04-21 DIAGNOSIS — I2510 Atherosclerotic heart disease of native coronary artery without angina pectoris: Secondary | ICD-10-CM | POA: Diagnosis not present

## 2013-04-21 DIAGNOSIS — IMO0001 Reserved for inherently not codable concepts without codable children: Secondary | ICD-10-CM | POA: Diagnosis not present

## 2013-04-21 DIAGNOSIS — R269 Unspecified abnormalities of gait and mobility: Secondary | ICD-10-CM | POA: Diagnosis not present

## 2013-04-21 DIAGNOSIS — N390 Urinary tract infection, site not specified: Secondary | ICD-10-CM | POA: Diagnosis not present

## 2013-04-21 DIAGNOSIS — I10 Essential (primary) hypertension: Secondary | ICD-10-CM | POA: Diagnosis not present

## 2013-04-25 ENCOUNTER — Encounter: Payer: Self-pay | Admitting: Critical Care Medicine

## 2013-04-25 ENCOUNTER — Ambulatory Visit (INDEPENDENT_AMBULATORY_CARE_PROVIDER_SITE_OTHER): Payer: Medicare Other | Admitting: Critical Care Medicine

## 2013-04-25 VITALS — BP 122/84 | HR 64 | Ht 61.0 in | Wt 124.0 lb

## 2013-04-25 DIAGNOSIS — J438 Other emphysema: Secondary | ICD-10-CM

## 2013-04-25 DIAGNOSIS — J439 Emphysema, unspecified: Secondary | ICD-10-CM

## 2013-04-25 NOTE — Progress Notes (Signed)
Subjective:    Patient ID: [name] [name], female    DOB: [birth year], 79 y.o.   MRN: [id number]  HPI  This is a 79 y.o. 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 The pt also has hx of upper airway obstruction with difficult intubation with prior operative procedures. Also hx Breast CA s/p resection and XRT. Hx Gallstone disease with conservative Rx due to difficult intubation status.   04/25/2013 Chief Complaint  Patient presents with  . Follow-up    Reports fatigue, leg weakness. States that she just doesn't feel well. Thinks that this may be coming from get over PNA.  Since last ov >>noting frequency of urination . No real cough, rash has resolved. Pt remains very weak.  PT still comes .   No falls. No further n/v no diarrhea.  Dyspnea is better  Pt had medrol called in last week, SN called this in for hives   Past Medical History  Diagnosis Date  . ANXIETY 08/16/2008  . CAD, UNSPECIFIED SITE 10/17/2008  . CHOLELITHIASIS 08/23/2008  . COPD 01/04/2010    FeV1 101%, DLCO64% 2008  . 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 DIVERTICULOSIS-COLON 04/07/2008  . GERD 04/19/2007  . HYPERSOMNIA, ASSOCIATED WITH SLEEP APNEA 02/01/2007  . HYPERTENSION 11/12/2006  . MITRAL VALVE PROLAPSE, HX OF 10/07/2008  . NEOPLASM, MALIGNANT, BREAST, RIGHT 02/15/2008  . OSTEOPOROSIS 04/04/2008  . Raynaud's syndrome 04/25/2009  . Airway obstruction   . History of diverticulitis of colon   . Tortuous colon   . Anal stricture   . Chronic constipation   . Fatty liver   . Hiatal hernia   . Esophageal stricture   . IBS (irritable bowel syndrome)   . Pulmonary fibrosis   . Sleep apnea   . COPD (chronic obstructive pulmonary disease)   . Colitis     sigmoid  . Candida esophagitis   . Diverticulitis   . Hearing aid worn     bilateral  . Atrial fibrillation     she denies known hx of afib 02/24/13  . Complication of anesthesia      difficult intubation     Family History  Problem Relation Age of Onset  . Parkinsonism Father   . Coronary artery disease Father   . Breast cancer      aunt  . Colon cancer      aunt, age 27  . Uterine cancer      aunt  . Stroke Mother   . Heart failure Mother   . Cancer Maternal Aunt     breast     History   Social History  . Marital Status: Widowed    Spouse Name: N/A    Number of Children: 3  . Years of Education: N/A   Occupational History  . retireed    Social History Main Topics  . Smoking status: Former Smoker -- 2.00 packs/day for 30 years    Types: Cigarettes    Quit date: 05/13/1983  . Smokeless tobacco: Never Used  . Alcohol Use: Yes     Comment: 1-2 glaases wine per day  . Drug Use: No  . Sexual Activity: No   Other Topics Concern  . Not on file   Social History Narrative   Living in assisted living     Allergies  Allergen Reactions  . Azithromycin Hives, Diarrhea and Dermatitis    rash  . Ciprofloxacin Swelling  . Levaquin [Levofloxacin  In D5w] Hives  . Penicillins     REACTION: hives, has had mild doses that she did not have reactions to  . Prednisone   . Sulfamethoxazole     REACTION: Nausea/vomiting     Outpatient Prescriptions Prior to Visit  Medication Sig Dispense Refill  . albuterol (PROVENTIL HFA;VENTOLIN HFA) 108 (90 BASE) MCG/ACT inhaler Inhale 2 puffs into the lungs every 6 (six) hours as needed for wheezing.      . AMBULATORY NON FORMULARY MEDICATION Oxygen at bedtime-2 or 2 1/2 liters      . amLODipine (NORVASC) 2.5 MG tablet Take 2.5 mg by mouth daily.      Marland Kitchen aspirin 325 MG tablet Take 325 mg by mouth daily.      . diazepam (VALIUM) 5 MG tablet Take 0.5 tablets (2.5 mg total) by mouth at bedtime.  30 tablet  0  . diphenhydrAMINE (BENADRYL) 25 mg capsule Take 1 capsule (25 mg total) by mouth every 6 (six) hours as needed for itching.  30 capsule  0  . famotidine (PEPCID) 20 MG tablet Take 1 tablet (20 mg total) by mouth at  bedtime.  30 tablet  0  . hydrOXYzine (ATARAX/VISTARIL) 10 MG tablet Take 1 tablet (10 mg total) by mouth every 4 (four) hours as needed.  50 tablet  0  . losartan (COZAAR) 50 MG tablet Take 50 mg by mouth daily.      . methylPREDNISolone (MEDROL DOSEPAK) 4 MG tablet Take as directed  21 tablet  0  . mometasone (NASONEX) 50 MCG/ACT nasal spray Place 1 spray into the nose 2 (two) times daily.      . nitroGLYCERIN (NITROSTAT) 0.4 MG SL tablet Place 0.4 mg under the tongue every 5 (five) minutes as needed for chest pain.      Marland Kitchen omeprazole (PRILOSEC) 20 MG capsule Take 20 mg by mouth daily.      . polyethylene glycol (MIRALAX / GLYCOLAX) packet Take 17 g by mouth daily as needed (for constipation).      . promethazine (PHENERGAN) 25 MG tablet Take 1 tablet (25 mg total) by mouth every 4 (four) hours as needed for nausea or vomiting.  60 tablet  2  . Spacer/Aero-Holding Chambers (AEROCHAMBER MV) inhaler Use as instructed  1 each  0   No facility-administered medications prior to visit.     Review of Systems  As above     Objective:   Physical Exam BP 122/84  Pulse 64  Ht 5\' 1"  (1.549 m)  Wt 124 lb (56.246 kg)  BMI 23.44 kg/m2  SpO2 98%  GEN: A/Ox3;  , NAD, elderly  HEENT:  [place]/AT,  EACs-clear, TMs-wnl, NOSE-clear, THROAT-clear, no lesions, no postnasal drip or exudate noted.   NECK:  Supple w/ fair ROM; no JVD; normal carotid impulses w/o bruits; no thyromegaly or nodules palpated; no lymphadenopathy.  RESP  Coarse BS w/ no wheezingno accessory muscle use,   CARD:  RRR, no m/r/g  , no peripheral edema, pulses intact, no cyanosis or clubbing.  Musco: Warm bil, no deformities or joint swelling noted.   Neuro: alert, no focal deficits noted.    Skin: Warm, no lesions rash noted over anterior trunk and thorax          Assessment & Plan:   COPD with emphysema, gold stage A Gold A COPD with recent left lower lobe pneumonia and subsequent allergic reaction to azithromycin now  resolving Plan Finish Medrol dose pak When the medrol dose pak  is finished, then STOP Pepcid You can use benadryl as needed for itching No other medication changes Continue physical therapy Return 2 months    This patient was admitted to the hospital see history and physical for full assessment and plan  Updated Medication List Outpatient Encounter Prescriptions as of 04/25/2013  Medication Sig  . albuterol (PROVENTIL HFA;VENTOLIN HFA) 108 (90 BASE) MCG/ACT inhaler Inhale 2 puffs into the lungs every 6 (six) hours as needed for wheezing.  . AMBULATORY NON FORMULARY MEDICATION Oxygen at bedtime-2 or 2 1/2 liters  . amLODipine (NORVASC) 2.5 MG tablet Take 2.5 mg by mouth daily.  Marland Kitchen aspirin 325 MG tablet Take 325 mg by mouth daily.  . diazepam (VALIUM) 5 MG tablet Take 0.5 tablets (2.5 mg total) by mouth at bedtime.  . diphenhydrAMINE (BENADRYL) 25 mg capsule Take 1 capsule (25 mg total) by mouth every 6 (six) hours as needed for itching.  . famotidine (PEPCID) 20 MG tablet Take 1 tablet (20 mg total) by mouth at bedtime.  . hydrOXYzine (ATARAX/VISTARIL) 10 MG tablet Take 1 tablet (10 mg total) by mouth every 4 (four) hours as needed.  Marland Kitchen losartan (COZAAR) 50 MG tablet Take 50 mg by mouth daily.  . methylPREDNISolone (MEDROL DOSEPAK) 4 MG tablet Take as directed  . mometasone (NASONEX) 50 MCG/ACT nasal spray Place 1 spray into the nose 2 (two) times daily.  . nitroGLYCERIN (NITROSTAT) 0.4 MG SL tablet Place 0.4 mg under the tongue every 5 (five) minutes as needed for chest pain.  Marland Kitchen omeprazole (PRILOSEC) 20 MG capsule Take 20 mg by mouth daily.  . polyethylene glycol (MIRALAX / GLYCOLAX) packet Take 17 g by mouth daily as needed (for constipation).  . promethazine (PHENERGAN) 25 MG tablet Take 1 tablet (25 mg total) by mouth every 4 (four) hours as needed for nausea or vomiting.  Marland Kitchen Spacer/Aero-Holding Chambers (AEROCHAMBER MV) inhaler Use as instructed

## 2013-04-25 NOTE — Patient Instructions (Signed)
Finish Medrol dose pak When the medrol dose pak is finished, then STOP Pepcid You can use benadryl as needed for itching No other medication changes Continue physical therapy Return 2 months

## 2013-04-25 NOTE — Assessment & Plan Note (Signed)
Gold A COPD with recent left lower lobe pneumonia and subsequent allergic reaction to azithromycin now resolving Plan Finish Medrol dose pak When the medrol dose pak is finished, then STOP Pepcid You can use benadryl as needed for itching No other medication changes Continue physical therapy Return 2 months

## 2013-04-27 DIAGNOSIS — I2510 Atherosclerotic heart disease of native coronary artery without angina pectoris: Secondary | ICD-10-CM | POA: Diagnosis not present

## 2013-04-27 DIAGNOSIS — IMO0001 Reserved for inherently not codable concepts without codable children: Secondary | ICD-10-CM | POA: Diagnosis not present

## 2013-04-27 DIAGNOSIS — Z792 Long term (current) use of antibiotics: Secondary | ICD-10-CM | POA: Diagnosis not present

## 2013-04-27 DIAGNOSIS — N390 Urinary tract infection, site not specified: Secondary | ICD-10-CM | POA: Diagnosis not present

## 2013-04-27 DIAGNOSIS — R269 Unspecified abnormalities of gait and mobility: Secondary | ICD-10-CM | POA: Diagnosis not present

## 2013-04-27 DIAGNOSIS — I10 Essential (primary) hypertension: Secondary | ICD-10-CM | POA: Diagnosis not present

## 2013-04-29 DIAGNOSIS — Z792 Long term (current) use of antibiotics: Secondary | ICD-10-CM | POA: Diagnosis not present

## 2013-04-29 DIAGNOSIS — R269 Unspecified abnormalities of gait and mobility: Secondary | ICD-10-CM | POA: Diagnosis not present

## 2013-04-29 DIAGNOSIS — IMO0001 Reserved for inherently not codable concepts without codable children: Secondary | ICD-10-CM | POA: Diagnosis not present

## 2013-04-29 DIAGNOSIS — I2510 Atherosclerotic heart disease of native coronary artery without angina pectoris: Secondary | ICD-10-CM | POA: Diagnosis not present

## 2013-04-29 DIAGNOSIS — N390 Urinary tract infection, site not specified: Secondary | ICD-10-CM | POA: Diagnosis not present

## 2013-04-29 DIAGNOSIS — I10 Essential (primary) hypertension: Secondary | ICD-10-CM | POA: Diagnosis not present

## 2013-05-02 ENCOUNTER — Other Ambulatory Visit: Payer: Self-pay | Admitting: Pulmonary Disease

## 2013-05-02 DIAGNOSIS — IMO0001 Reserved for inherently not codable concepts without codable children: Secondary | ICD-10-CM | POA: Diagnosis not present

## 2013-05-02 DIAGNOSIS — I2510 Atherosclerotic heart disease of native coronary artery without angina pectoris: Secondary | ICD-10-CM | POA: Diagnosis not present

## 2013-05-02 DIAGNOSIS — I10 Essential (primary) hypertension: Secondary | ICD-10-CM | POA: Diagnosis not present

## 2013-05-02 DIAGNOSIS — N390 Urinary tract infection, site not specified: Secondary | ICD-10-CM | POA: Diagnosis not present

## 2013-05-02 DIAGNOSIS — R269 Unspecified abnormalities of gait and mobility: Secondary | ICD-10-CM | POA: Diagnosis not present

## 2013-05-02 DIAGNOSIS — Z792 Long term (current) use of antibiotics: Secondary | ICD-10-CM | POA: Diagnosis not present

## 2013-05-04 DIAGNOSIS — N390 Urinary tract infection, site not specified: Secondary | ICD-10-CM | POA: Diagnosis not present

## 2013-05-04 DIAGNOSIS — I2510 Atherosclerotic heart disease of native coronary artery without angina pectoris: Secondary | ICD-10-CM | POA: Diagnosis not present

## 2013-05-04 DIAGNOSIS — I10 Essential (primary) hypertension: Secondary | ICD-10-CM | POA: Diagnosis not present

## 2013-05-04 DIAGNOSIS — Z792 Long term (current) use of antibiotics: Secondary | ICD-10-CM | POA: Diagnosis not present

## 2013-05-04 DIAGNOSIS — R269 Unspecified abnormalities of gait and mobility: Secondary | ICD-10-CM | POA: Diagnosis not present

## 2013-05-04 DIAGNOSIS — IMO0001 Reserved for inherently not codable concepts without codable children: Secondary | ICD-10-CM | POA: Diagnosis not present

## 2013-05-09 DIAGNOSIS — I2510 Atherosclerotic heart disease of native coronary artery without angina pectoris: Secondary | ICD-10-CM | POA: Diagnosis not present

## 2013-05-09 DIAGNOSIS — N390 Urinary tract infection, site not specified: Secondary | ICD-10-CM | POA: Diagnosis not present

## 2013-05-09 DIAGNOSIS — Z792 Long term (current) use of antibiotics: Secondary | ICD-10-CM | POA: Diagnosis not present

## 2013-05-09 DIAGNOSIS — R269 Unspecified abnormalities of gait and mobility: Secondary | ICD-10-CM | POA: Diagnosis not present

## 2013-05-09 DIAGNOSIS — IMO0001 Reserved for inherently not codable concepts without codable children: Secondary | ICD-10-CM | POA: Diagnosis not present

## 2013-05-09 DIAGNOSIS — I10 Essential (primary) hypertension: Secondary | ICD-10-CM | POA: Diagnosis not present

## 2013-05-10 DIAGNOSIS — I2510 Atherosclerotic heart disease of native coronary artery without angina pectoris: Secondary | ICD-10-CM | POA: Diagnosis not present

## 2013-05-10 DIAGNOSIS — IMO0001 Reserved for inherently not codable concepts without codable children: Secondary | ICD-10-CM | POA: Diagnosis not present

## 2013-05-10 DIAGNOSIS — Z792 Long term (current) use of antibiotics: Secondary | ICD-10-CM | POA: Diagnosis not present

## 2013-05-10 DIAGNOSIS — I10 Essential (primary) hypertension: Secondary | ICD-10-CM | POA: Diagnosis not present

## 2013-05-10 DIAGNOSIS — R269 Unspecified abnormalities of gait and mobility: Secondary | ICD-10-CM | POA: Diagnosis not present

## 2013-05-10 DIAGNOSIS — N390 Urinary tract infection, site not specified: Secondary | ICD-10-CM | POA: Diagnosis not present

## 2013-05-11 DIAGNOSIS — N390 Urinary tract infection, site not specified: Secondary | ICD-10-CM | POA: Diagnosis not present

## 2013-05-11 DIAGNOSIS — I10 Essential (primary) hypertension: Secondary | ICD-10-CM | POA: Diagnosis not present

## 2013-05-11 DIAGNOSIS — IMO0001 Reserved for inherently not codable concepts without codable children: Secondary | ICD-10-CM | POA: Diagnosis not present

## 2013-05-11 DIAGNOSIS — R269 Unspecified abnormalities of gait and mobility: Secondary | ICD-10-CM | POA: Diagnosis not present

## 2013-05-11 DIAGNOSIS — I2510 Atherosclerotic heart disease of native coronary artery without angina pectoris: Secondary | ICD-10-CM | POA: Diagnosis not present

## 2013-05-11 DIAGNOSIS — Z792 Long term (current) use of antibiotics: Secondary | ICD-10-CM | POA: Diagnosis not present

## 2013-05-13 ENCOUNTER — Telehealth: Payer: Self-pay | Admitting: Internal Medicine

## 2013-05-13 NOTE — Telephone Encounter (Signed)
Catherine/daughter calling about diarrhea.  States onset 05/11/13; states she "has a constant seepage and liquid draining."   Afebrile.  Feeling dizzy and weak on standing.  Per diarrhea protocol, advised ED due to possible signs of dehydration; TC to office to advise of disposition.  Per Irine Seal, to go to ED.  krs/can

## 2013-05-13 NOTE — Telephone Encounter (Signed)
TC to patient/family/Catherine per triage request regarding diarrhea and concern about having eaten rare meat.  On callback, RN unable to reach caller at number given; message left on identified voicemail to contact office for assistance.  krs/can

## 2013-05-17 DIAGNOSIS — Z85820 Personal history of malignant melanoma of skin: Secondary | ICD-10-CM | POA: Diagnosis not present

## 2013-05-17 DIAGNOSIS — B351 Tinea unguium: Secondary | ICD-10-CM | POA: Diagnosis not present

## 2013-05-17 DIAGNOSIS — D235 Other benign neoplasm of skin of trunk: Secondary | ICD-10-CM | POA: Diagnosis not present

## 2013-05-21 ENCOUNTER — Telehealth: Payer: Self-pay | Admitting: Internal Medicine

## 2013-05-21 NOTE — Telephone Encounter (Signed)
[id number] - [place], [place] [address] AT [address] requesting refill of diazepam (VALIUM) 5 MG tablet, last filled 03/17/13

## 2013-05-23 MED ORDER — DIAZEPAM 5 MG PO TABS: 2.5000 mg | ORAL_TABLET | Freq: Every day | ORAL | Status: DC

## 2013-05-23 NOTE — Telephone Encounter (Signed)
Ok per Dr Leanne Chang, rx called in

## 2013-05-24 DIAGNOSIS — Z792 Long term (current) use of antibiotics: Secondary | ICD-10-CM | POA: Diagnosis not present

## 2013-05-24 DIAGNOSIS — IMO0001 Reserved for inherently not codable concepts without codable children: Secondary | ICD-10-CM | POA: Diagnosis not present

## 2013-05-24 DIAGNOSIS — N390 Urinary tract infection, site not specified: Secondary | ICD-10-CM | POA: Diagnosis not present

## 2013-05-24 DIAGNOSIS — I10 Essential (primary) hypertension: Secondary | ICD-10-CM | POA: Diagnosis not present

## 2013-05-24 DIAGNOSIS — I2510 Atherosclerotic heart disease of native coronary artery without angina pectoris: Secondary | ICD-10-CM | POA: Diagnosis not present

## 2013-05-24 DIAGNOSIS — R269 Unspecified abnormalities of gait and mobility: Secondary | ICD-10-CM | POA: Diagnosis not present

## 2013-05-26 DIAGNOSIS — IMO0001 Reserved for inherently not codable concepts without codable children: Secondary | ICD-10-CM | POA: Diagnosis not present

## 2013-05-26 DIAGNOSIS — N390 Urinary tract infection, site not specified: Secondary | ICD-10-CM | POA: Diagnosis not present

## 2013-05-26 DIAGNOSIS — I2510 Atherosclerotic heart disease of native coronary artery without angina pectoris: Secondary | ICD-10-CM | POA: Diagnosis not present

## 2013-05-26 DIAGNOSIS — Z792 Long term (current) use of antibiotics: Secondary | ICD-10-CM | POA: Diagnosis not present

## 2013-05-26 DIAGNOSIS — R269 Unspecified abnormalities of gait and mobility: Secondary | ICD-10-CM | POA: Diagnosis not present

## 2013-05-26 DIAGNOSIS — I10 Essential (primary) hypertension: Secondary | ICD-10-CM | POA: Diagnosis not present

## 2013-05-31 ENCOUNTER — Telehealth: Payer: Self-pay | Admitting: Internal Medicine

## 2013-05-31 NOTE — Telephone Encounter (Signed)
Called pt back times two but after several rings went to voice mail each time.  Left message to call back if still needed.

## 2013-06-01 ENCOUNTER — Other Ambulatory Visit: Payer: Self-pay | Admitting: Internal Medicine

## 2013-06-02 NOTE — Telephone Encounter (Signed)
Pt is requesting to see Dr.fry on fri. Pt has a swollen lump on temple. Can I schedule?

## 2013-06-06 ENCOUNTER — Encounter: Payer: Self-pay | Admitting: Family Medicine

## 2013-06-06 ENCOUNTER — Ambulatory Visit (INDEPENDENT_AMBULATORY_CARE_PROVIDER_SITE_OTHER): Payer: Medicare Other | Admitting: Family Medicine

## 2013-06-06 VITALS — BP 120/78 | Temp 98.0°F | Wt 123.0 lb

## 2013-06-06 DIAGNOSIS — L508 Other urticaria: Secondary | ICD-10-CM | POA: Diagnosis not present

## 2013-06-06 DIAGNOSIS — H9209 Otalgia, unspecified ear: Secondary | ICD-10-CM

## 2013-06-06 DIAGNOSIS — F411 Generalized anxiety disorder: Secondary | ICD-10-CM

## 2013-06-06 NOTE — Progress Notes (Signed)
Chief Complaint  Patient presents with  . left ear pain  . refill diazepam  . Dermatitis    swelling and itchy     HPI:  Acute visit for:  Ear Pain: -hurt briefly, now resolved  Dermatitis: -itchy whelps that come and go -for several months, thought to be allergic reaction to azithromycin, but no longer taking this and can't think of any other new medications or products -no cough, throat swelling, trouble breathing or other issues  ROS: See pertinent positives and negatives per HPI.  Past Medical History  Diagnosis Date  . ANXIETY 08/16/2008  . CAD, UNSPECIFIED SITE 10/17/2008  . CHOLELITHIASIS 08/23/2008  . COPD 01/04/2010    FeV1 101%, DLCO64% 2008  . 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 DIVERTICULOSIS-COLON 04/07/2008  . GERD 04/19/2007  . HYPERSOMNIA, ASSOCIATED WITH SLEEP APNEA 02/01/2007  . HYPERTENSION 11/12/2006  . MITRAL VALVE PROLAPSE, HX OF 10/07/2008  . NEOPLASM, MALIGNANT, BREAST, RIGHT 02/15/2008  . OSTEOPOROSIS 04/04/2008  . Raynaud's syndrome 04/25/2009  . Airway obstruction   . History of diverticulitis of colon   . Tortuous colon   . Anal stricture   . Chronic constipation   . Fatty liver   . Hiatal hernia   . Esophageal stricture   . IBS (irritable bowel syndrome)   . Pulmonary fibrosis   . Sleep apnea   . COPD (chronic obstructive pulmonary disease)   . Colitis     sigmoid  . Candida esophagitis   . Diverticulitis   . Hearing aid worn     bilateral  . Atrial fibrillation     she denies known hx of afib 02/24/13  . Complication of anesthesia     difficult intubation    Past Surgical History  Procedure Laterality Date  . Abdominal hysterectomy    . Bilateral salpingoophorectomy      s/p prolapsed bladder  . Bladder surgery      prolapsed  . Thoracotomy      granuloma, pulmonary-thoracotomy  . Breast surgery  07-21-07    mastectomy (right), all margins neg and LNs neg   . Appendectomy    . Tonsillectomy    . Rotator cuff  repair    . Mastectomy      right  . Breast cyst excision      left  . Mouth surgery    . Eye surgery      cataract removal bilateral  . Melanoma excision      rt clavicle Dr [facility] office)  . Lung surgery      Family History  Problem Relation Age of Onset  . Parkinsonism Father   . Coronary artery disease Father   . Breast cancer      aunt  . Colon cancer      aunt, age 64  . Uterine cancer      aunt  . Stroke Mother   . Heart failure Mother   . Cancer Maternal Aunt     breast    History   Social History  . Marital Status: Widowed    Spouse Name: N/A    Number of Children: 3  . Years of Education: N/A   Occupational History  . retireed    Social History Main Topics  . Smoking status: Former Smoker -- 2.00 packs/day for 30 years    Types: Cigarettes    Quit date: 05/13/1983  . Smokeless tobacco: Never Used  . Alcohol Use: Yes     Comment: 1-2 glaases  wine per day  . Drug Use: No  . Sexual Activity: No   Other Topics Concern  . None   Social History Narrative   Living in assisted living    Current outpatient prescriptions:albuterol (PROVENTIL HFA;VENTOLIN HFA) 108 (90 BASE) MCG/ACT inhaler, Inhale 2 puffs into the lungs every 6 (six) hours as needed for wheezing., Disp: , Rfl: ;  AMBULATORY NON FORMULARY MEDICATION, Oxygen at bedtime-2 or 2 1/2 liters, Disp: , Rfl: ;  amLODipine (NORVASC) 2.5 MG tablet, Take 2.5 mg by mouth daily., Disp: , Rfl: ;  aspirin 325 MG tablet, Take 325 mg by mouth daily., Disp: , Rfl:  diazepam (VALIUM) 5 MG tablet, Take 0.5 tablets (2.5 mg total) by mouth at bedtime., Disp: 30 tablet, Rfl: 1;  diphenhydrAMINE (BENADRYL) 25 mg capsule, Take 1 capsule (25 mg total) by mouth every 6 (six) hours as needed for itching., Disp: 30 capsule, Rfl: 0;  famotidine (PEPCID) 20 MG tablet, Take 1 tablet (20 mg total) by mouth at bedtime., Disp: 30 tablet, Rfl: 0 hydrOXYzine (ATARAX/VISTARIL) 10 MG tablet, Take 1 tablet (10 mg total) by mouth  every 4 (four) hours as needed., Disp: 50 tablet, Rfl: 0;  losartan (COZAAR) 50 MG tablet, Take 50 mg by mouth daily., Disp: , Rfl: ;  methylPREDNISolone (MEDROL DOSEPAK) 4 MG tablet, Take as directed, Disp: 21 tablet, Rfl: 0;  mometasone (NASONEX) 50 MCG/ACT nasal spray, Place 1 spray into the nose 2 (two) times daily., Disp: , Rfl:  nitroGLYCERIN (NITROSTAT) 0.4 MG SL tablet, Place 0.4 mg under the tongue every 5 (five) minutes as needed for chest pain., Disp: , Rfl: ;  omeprazole (PRILOSEC) 20 MG capsule, Take 20 mg by mouth daily., Disp: , Rfl: ;  polyethylene glycol (MIRALAX / GLYCOLAX) packet, Take 17 g by mouth daily as needed (for constipation)., Disp: , Rfl:  promethazine (PHENERGAN) 25 MG tablet, Take 1 tablet (25 mg total) by mouth every 4 (four) hours as needed for nausea or vomiting., Disp: 60 tablet, Rfl: 2;  Spacer/Aero-Holding Chambers (AEROCHAMBER MV) inhaler, Use as instructed, Disp: 1 each, Rfl: 0  EXAM:  Filed Vitals:   06/06/13 1506  BP: 120/78  Temp: 98 F (36.7 C)    Body mass index is 23.25 kg/(m^2).  GENERAL: vitals reviewed and listed above, alert, oriented, appears well hydrated and in no acute distress  HEENT: atraumatic, conjunttiva clear, no obvious abnormalities on inspection of external nose and ears, ear exam with otoscope normal  NECK: no obvious masses on inspection  LUNGS: clear to auscultation bilaterally, no wheezes, rales or rhonchi, good air movement  CV: HRRR, no peripheral edema  SKIN: few hives  MS: moves all extremities without noticeable abnormality  PSYCH: pleasant and cooperative, no obvious depression or anxiety  ASSESSMENT AND PLAN:  Discussed the following assessment and plan:  Recurrent periodic urticaria  Ear pain  ANXIETY  -daughter and her agreed to seeing allergist for recurrent urticaria of unknown cause - they will schedule appt -ear exam normal and no longer having ear pain, unsure what cause this, advised to follow  up if returns -appears PCP refilled medication -Patient advised to return or notify a doctor immediately if symptoms worsen or persist or new concerns arise.  There are no Patient Instructions on file for this visit.   [name] Benton R.

## 2013-06-06 NOTE — Progress Notes (Signed)
Pre visit review using our clinic review tool, if applicable. No additional management support is needed unless otherwise documented below in the visit note. 

## 2013-06-09 ENCOUNTER — Telehealth: Payer: Self-pay | Admitting: Internal Medicine

## 2013-06-09 MED ORDER — DIAZEPAM 5 MG PO TABS: 2.5000 mg | ORAL_TABLET | Freq: Every day | ORAL | Status: DC

## 2013-06-09 NOTE — Telephone Encounter (Signed)
rx was faxed to pharmacy.  

## 2013-06-09 NOTE — Telephone Encounter (Signed)
Patient Information:  Caller Name: [name] [name]  Phone: [phone]  Patient: [name] [name]  Gender: Female  DOB: 02-06-1924  Age: 80 Years  PCP: Phoebe Sharps (Adults only)  Office Follow Up:  Does the office need to follow up with this patient?: Yes  Instructions For The Office: Daughter is asking for refill to be called to [facility]. Review of Epic shows a refill called to [facility] on 05/23/13 at 10;30 a.m.    [facility] states [facility] DID NOT RECEIVE a refill order on 05/23/13.  PLEASE CONTACT  RN Note:  Daughter is asking for refill to be called to [facility]. Review of Epic shows a [facility]ill [facility] to [facility] on 05/23/13 at 10;30 a.m.    [facility] states [facility] DID NOT RECEIVE a refill order on 05/23/13.  PLEASE CONTACT  Symptoms  Reason For Call & Symptoms: Daughter is calling about her mother.  She states her mother saw Dr. Maudie Mercury  on 06/06/13 and trying to get her Diazepam refilled "for weeks".  Reviwed EPIC and refill was [facility] on [facility]/13 [facility].   [facility] states [facility] did NOT RECEIVE a refill request.  [facility] states [facility] have sent request for a refill but have not heard anything  Reviewed Health History In EMR: Yes  Reviewed Medications In EMR: Yes  Reviewed Allergies In EMR: Yes  Reviewed Surgeries / Procedures: Yes  Date of Onset of Symptoms: 06/09/2013  Guideline(s) Used:  No Protocol Available - Information Only  Disposition Per Guideline:   Discuss with PCP and Callback by Nurse Today  Reason For Disposition Reached:   Nursing judgment  Advice Given:  Call Back If:  New symptoms develop  You become worse.  RN Overrode Recommendation:  Patient Requests Prescription  Daughter is asking for refill to be called to [facility]. Review of Epic shows a [facility] on [facility]/13 at 10;30 a.m.    [facility] states [facility] DID NOT RECEIVE a refill order on 05/23/13.  PLEASE CONTACT

## 2013-06-15 DIAGNOSIS — L509 Urticaria, unspecified: Secondary | ICD-10-CM | POA: Diagnosis not present

## 2013-06-15 DIAGNOSIS — J309 Allergic rhinitis, unspecified: Secondary | ICD-10-CM | POA: Diagnosis not present

## 2013-06-27 ENCOUNTER — Ambulatory Visit: Payer: Medicare Other | Admitting: Critical Care Medicine

## 2013-07-01 ENCOUNTER — Ambulatory Visit: Payer: Medicare Other | Admitting: Critical Care Medicine

## 2013-07-08 ENCOUNTER — Ambulatory Visit (INDEPENDENT_AMBULATORY_CARE_PROVIDER_SITE_OTHER)
Admission: RE | Admit: 2013-07-08 | Discharge: 2013-07-08 | Disposition: A | Discharge status: Home / Self Care | Payer: Medicare Other | Source: Ambulatory Visit | Attending: Critical Care Medicine | Admitting: Critical Care Medicine

## 2013-07-08 DIAGNOSIS — R918 Other nonspecific abnormal finding of lung field: Secondary | ICD-10-CM | POA: Diagnosis not present

## 2013-07-08 DIAGNOSIS — J984 Other disorders of lung: Secondary | ICD-10-CM | POA: Diagnosis not present

## 2013-07-12 ENCOUNTER — Encounter: Payer: Self-pay | Admitting: Internal Medicine

## 2013-07-12 ENCOUNTER — Ambulatory Visit (INDEPENDENT_AMBULATORY_CARE_PROVIDER_SITE_OTHER): Payer: Medicare Other | Admitting: Internal Medicine

## 2013-07-12 VITALS — BP 122/80 | Temp 98.0°F | Ht 61.0 in | Wt 119.0 lb

## 2013-07-12 DIAGNOSIS — L500 Allergic urticaria: Secondary | ICD-10-CM | POA: Diagnosis not present

## 2013-07-12 DIAGNOSIS — R300 Dysuria: Secondary | ICD-10-CM

## 2013-07-12 LAB — POCT URINALYSIS DIPSTICK
BILIRUBIN UA: NEGATIVE
GLUCOSE UA: NEGATIVE
Ketones, UA: NEGATIVE
NITRITE UA: NEGATIVE
Protein, UA: NEGATIVE
Spec Grav, UA: 1.02
Urobilinogen, UA: 0.2
pH, UA: 5.5

## 2013-07-12 MED ORDER — DIAZEPAM 5 MG PO TABS: 5.0000 mg | ORAL_TABLET | Freq: Every day | ORAL | Status: DC

## 2013-07-12 MED ORDER — NITROFURANTOIN MACROCRYSTAL 100 MG PO CAPS: 100.0000 mg | ORAL_CAPSULE | Freq: Two times a day (BID) | ORAL | Status: DC

## 2013-07-12 MED ORDER — HYDROXYZINE HCL 10 MG PO TABS: 10.0000 mg | ORAL_TABLET | ORAL | Status: DC | PRN

## 2013-07-12 MED ORDER — AMLODIPINE BESYLATE 5 MG PO TABS: 5.0000 mg | ORAL_TABLET | Freq: Every day | ORAL | Status: DC

## 2013-07-12 NOTE — Progress Notes (Signed)
Pre visit review using our clinic review tool, if applicable. No additional management support is needed unless otherwise documented below in the visit note. 

## 2013-07-12 NOTE — Progress Notes (Signed)
Dysuria for 3 days- no fever or chills.  Rash- she has developed intermittent urticaria- transient and migratory- happens on arms, trunk and face. Allergist: skin testing without identification of any allergy.    Patient's daughter is with her today.  Review problem list, allergies, medications.  Review of systems: She complains of some fatigue but admits to being very active. She has trouble sleeping at night and is dependent on Valium. She has chronic hearing loss. No other specific complaints.  BP 122/80  Temp(Src) 98 F (36.7 C) (Oral)  Ht 5\' 1"  (1.549 m)  Wt 119 lb (53.978 kg)  BMI 22.50 kg/m2  Well-developed well-nourished female in no acute distress. HEENT exam atraumatic, normocephalic, extraocular muscles are intact. Neck is supple. No jugular venous distention no thyromegaly. Chest clear to auscultation without increased work of breathing. Cardiac exam S1 and S2 are regular. Abdominal exam active bowel sounds, soft, nontender. Extremities no edema. Neurologic exam she is alert without any motor sensory deficits. Gait is normal.  Assessment and plan: UTI. Will treat with Macrobid.  Recurrent urticaria. I have reviewed her medications in detail. I have updated her medication list. I'll try to limit medications. She will call in 2 weeks if her symptoms persist. She then may need to see an allergist again and get appropriate laboratory work.

## 2013-07-13 ENCOUNTER — Inpatient Hospital Stay (HOSPITAL_COMMUNITY)
Admission: EM | Admit: 2013-07-13 | Discharge: 2013-07-15 | DRG: 193 | Disposition: A | Discharge status: Home Health | Payer: Medicare Other | Attending: Internal Medicine | Admitting: Internal Medicine

## 2013-07-13 ENCOUNTER — Emergency Department (HOSPITAL_COMMUNITY): Payer: Medicare Other

## 2013-07-13 ENCOUNTER — Encounter (HOSPITAL_COMMUNITY): Payer: Self-pay | Admitting: *Deleted

## 2013-07-13 DIAGNOSIS — Z880 Allergy status to penicillin: Secondary | ICD-10-CM

## 2013-07-13 DIAGNOSIS — Z888 Allergy status to other drugs, medicaments and biological substances status: Secondary | ICD-10-CM

## 2013-07-13 DIAGNOSIS — I10 Essential (primary) hypertension: Secondary | ICD-10-CM | POA: Diagnosis present

## 2013-07-13 DIAGNOSIS — I2510 Atherosclerotic heart disease of native coronary artery without angina pectoris: Secondary | ICD-10-CM | POA: Diagnosis present

## 2013-07-13 DIAGNOSIS — I059 Rheumatic mitral valve disease, unspecified: Secondary | ICD-10-CM | POA: Diagnosis present

## 2013-07-13 DIAGNOSIS — K589 Irritable bowel syndrome without diarrhea: Secondary | ICD-10-CM | POA: Diagnosis present

## 2013-07-13 DIAGNOSIS — Z882 Allergy status to sulfonamides status: Secondary | ICD-10-CM

## 2013-07-13 DIAGNOSIS — Z803 Family history of malignant neoplasm of breast: Secondary | ICD-10-CM

## 2013-07-13 DIAGNOSIS — K7689 Other specified diseases of liver: Secondary | ICD-10-CM | POA: Diagnosis present

## 2013-07-13 DIAGNOSIS — M810 Age-related osteoporosis without current pathological fracture: Secondary | ICD-10-CM | POA: Diagnosis present

## 2013-07-13 DIAGNOSIS — K219 Gastro-esophageal reflux disease without esophagitis: Secondary | ICD-10-CM | POA: Diagnosis present

## 2013-07-13 DIAGNOSIS — J8410 Pulmonary fibrosis, unspecified: Secondary | ICD-10-CM | POA: Diagnosis present

## 2013-07-13 DIAGNOSIS — Z9981 Dependence on supplemental oxygen: Secondary | ICD-10-CM | POA: Diagnosis not present

## 2013-07-13 DIAGNOSIS — K449 Diaphragmatic hernia without obstruction or gangrene: Secondary | ICD-10-CM | POA: Diagnosis present

## 2013-07-13 DIAGNOSIS — R0989 Other specified symptoms and signs involving the circulatory and respiratory systems: Secondary | ICD-10-CM | POA: Diagnosis not present

## 2013-07-13 DIAGNOSIS — D696 Thrombocytopenia, unspecified: Secondary | ICD-10-CM | POA: Diagnosis present

## 2013-07-13 DIAGNOSIS — Z79899 Other long term (current) drug therapy: Secondary | ICD-10-CM

## 2013-07-13 DIAGNOSIS — J438 Other emphysema: Secondary | ICD-10-CM | POA: Diagnosis not present

## 2013-07-13 DIAGNOSIS — N390 Urinary tract infection, site not specified: Secondary | ICD-10-CM | POA: Diagnosis not present

## 2013-07-13 DIAGNOSIS — R112 Nausea with vomiting, unspecified: Secondary | ICD-10-CM | POA: Diagnosis not present

## 2013-07-13 DIAGNOSIS — F411 Generalized anxiety disorder: Secondary | ICD-10-CM | POA: Diagnosis present

## 2013-07-13 DIAGNOSIS — R509 Fever, unspecified: Secondary | ICD-10-CM | POA: Diagnosis not present

## 2013-07-13 DIAGNOSIS — K5900 Constipation, unspecified: Secondary | ICD-10-CM | POA: Diagnosis present

## 2013-07-13 DIAGNOSIS — J189 Pneumonia, unspecified organism: Secondary | ICD-10-CM | POA: Diagnosis not present

## 2013-07-13 DIAGNOSIS — Z881 Allergy status to other antibiotic agents status: Secondary | ICD-10-CM

## 2013-07-13 DIAGNOSIS — I7300 Raynaud's syndrome without gangrene: Secondary | ICD-10-CM | POA: Diagnosis present

## 2013-07-13 DIAGNOSIS — J449 Chronic obstructive pulmonary disease, unspecified: Secondary | ICD-10-CM | POA: Diagnosis present

## 2013-07-13 DIAGNOSIS — G4730 Sleep apnea, unspecified: Secondary | ICD-10-CM | POA: Diagnosis present

## 2013-07-13 DIAGNOSIS — K222 Esophageal obstruction: Secondary | ICD-10-CM | POA: Diagnosis present

## 2013-07-13 DIAGNOSIS — R079 Chest pain, unspecified: Secondary | ICD-10-CM | POA: Diagnosis not present

## 2013-07-13 DIAGNOSIS — Z7982 Long term (current) use of aspirin: Secondary | ICD-10-CM

## 2013-07-13 DIAGNOSIS — J90 Pleural effusion, not elsewhere classified: Secondary | ICD-10-CM | POA: Diagnosis present

## 2013-07-13 DIAGNOSIS — R0609 Other forms of dyspnea: Secondary | ICD-10-CM | POA: Diagnosis not present

## 2013-07-13 DIAGNOSIS — D72829 Elevated white blood cell count, unspecified: Secondary | ICD-10-CM | POA: Diagnosis present

## 2013-07-13 DIAGNOSIS — I517 Cardiomegaly: Secondary | ICD-10-CM | POA: Diagnosis not present

## 2013-07-13 DIAGNOSIS — A419 Sepsis, unspecified organism: Secondary | ICD-10-CM | POA: Diagnosis not present

## 2013-07-13 DIAGNOSIS — Z853 Personal history of malignant neoplasm of breast: Secondary | ICD-10-CM

## 2013-07-13 DIAGNOSIS — Z85820 Personal history of malignant melanoma of skin: Secondary | ICD-10-CM | POA: Diagnosis not present

## 2013-07-13 DIAGNOSIS — J439 Emphysema, unspecified: Secondary | ICD-10-CM

## 2013-07-13 DIAGNOSIS — J4489 Other specified chronic obstructive pulmonary disease: Secondary | ICD-10-CM | POA: Diagnosis present

## 2013-07-13 LAB — BASIC METABOLIC PANEL
BUN: 11 mg/dL (ref 6–23)
CO2: 25 meq/L (ref 19–32)
Calcium: 9.1 mg/dL (ref 8.4–10.5)
Chloride: 100 mEq/L (ref 96–112)
Creatinine, Ser: 0.83 mg/dL (ref 0.50–1.10)
GFR calc Af Amer: 71 mL/min — ABNORMAL LOW (ref >90)
GFR calc non Af Amer: 62 mL/min — ABNORMAL LOW (ref >90)
GLUCOSE: 128 mg/dL — AB (ref 70–99)
POTASSIUM: 3.8 meq/L (ref 3.7–5.3)
SODIUM: 138 meq/L (ref 137–147)

## 2013-07-13 LAB — URINALYSIS, ROUTINE W REFLEX MICROSCOPIC
Bilirubin Urine: NEGATIVE
Glucose, UA: NEGATIVE mg/dL
Hgb urine dipstick: NEGATIVE
KETONES UR: NEGATIVE mg/dL
Nitrite: POSITIVE — AB
PH: 7 (ref 5.0–8.0)
Protein, ur: NEGATIVE mg/dL
Specific Gravity, Urine: 1.016 (ref 1.005–1.030)
Urobilinogen, UA: 1 mg/dL (ref 0.0–1.0)

## 2013-07-13 LAB — I-STAT TROPONIN, ED: TROPONIN I, POC: 0.02 ng/mL (ref 0.00–0.08)

## 2013-07-13 LAB — DIFFERENTIAL
Basophils Absolute: 0 10*3/uL (ref 0.0–0.1)
Basophils Relative: 0 % (ref 0–1)
Eosinophils Absolute: 0 10*3/uL (ref 0.0–0.7)
Eosinophils Relative: 0 % (ref 0–5)
Lymphocytes Relative: 5 % — ABNORMAL LOW (ref 12–46)
Lymphs Abs: 1 10*3/uL (ref 0.7–4.0)
Monocytes Absolute: 0.8 10*3/uL (ref 0.1–1.0)
Monocytes Relative: 4 % (ref 3–12)
NEUTROS PCT: 91 % — AB (ref 43–77)
Neutro Abs: 19.1 10*3/uL — ABNORMAL HIGH (ref 1.7–7.7)
WBC MORPHOLOGY: INCREASED

## 2013-07-13 LAB — CBC
HCT: 37.3 % (ref 36.0–46.0)
HEMOGLOBIN: 12.8 g/dL (ref 12.0–15.0)
MCH: 29.5 pg (ref 26.0–34.0)
MCHC: 34.3 g/dL (ref 30.0–36.0)
MCV: 85.9 fL (ref 78.0–100.0)
PLATELETS: 128 10*3/uL — AB (ref 150–400)
RBC: 4.34 MIL/uL (ref 3.87–5.11)
RDW: 13.6 % (ref 11.5–15.5)
WBC: 20.9 10*3/uL — AB (ref 4.0–10.5)

## 2013-07-13 LAB — HEPATIC FUNCTION PANEL
ALT: 10 U/L (ref 0–35)
AST: 16 U/L (ref 0–37)
Albumin: 3.6 g/dL (ref 3.5–5.2)
Alkaline Phosphatase: 64 U/L (ref 39–117)
BILIRUBIN TOTAL: 1.1 mg/dL (ref 0.3–1.2)
Bilirubin, Direct: <0.2 mg/dL (ref 0.0–0.3)
TOTAL PROTEIN: 6.7 g/dL (ref 6.0–8.3)

## 2013-07-13 LAB — URINE MICROSCOPIC-ADD ON

## 2013-07-13 LAB — STREP PNEUMONIAE URINARY ANTIGEN: Strep Pneumo Urinary Antigen: NEGATIVE

## 2013-07-13 LAB — PRO B NATRIURETIC PEPTIDE: PRO B NATRI PEPTIDE: 489 pg/mL — AB (ref 0–450)

## 2013-07-13 LAB — I-STAT CG4 LACTIC ACID, ED: LACTIC ACID, VENOUS: 1.49 mmol/L (ref 0.5–2.2)

## 2013-07-13 MED ORDER — DEXTROSE 5 % IV SOLN
1.0000 g | Freq: Three times a day (TID) | INTRAVENOUS | Status: DC
  Administered 2013-07-14 – 2013-07-15 (×5): 1 g via INTRAVENOUS
  Filled 2013-07-13 (×6): qty 1

## 2013-07-13 MED ORDER — ACETAMINOPHEN 325 MG PO TABS
650.0000 mg | ORAL_TABLET | Freq: Once | ORAL | Status: AC
  Administered 2013-07-13: 650 mg via ORAL
  Filled 2013-07-13: qty 2

## 2013-07-13 MED ORDER — DIAZEPAM 5 MG PO TABS
5.0000 mg | ORAL_TABLET | Freq: Every day | ORAL | Status: DC
  Administered 2013-07-13 – 2013-07-14 (×2): 5 mg via ORAL
  Filled 2013-07-13 (×2): qty 1

## 2013-07-13 MED ORDER — HYDROXYZINE HCL 10 MG PO TABS
10.0000 mg | ORAL_TABLET | ORAL | Status: DC | PRN
  Filled 2013-07-13: qty 1

## 2013-07-13 MED ORDER — DOXYCYCLINE HYCLATE 100 MG IV SOLR
100.0000 mg | INTRAVENOUS | Status: AC
  Administered 2013-07-13: 100 mg via INTRAVENOUS
  Filled 2013-07-13: qty 100

## 2013-07-13 MED ORDER — VANCOMYCIN HCL IN DEXTROSE 1-5 GM/200ML-% IV SOLN
1000.0000 mg | Freq: Once | INTRAVENOUS | Status: AC
  Administered 2013-07-13: 1000 mg via INTRAVENOUS
  Filled 2013-07-13: qty 200

## 2013-07-13 MED ORDER — VANCOMYCIN HCL IN DEXTROSE 750-5 MG/150ML-% IV SOLN
750.0000 mg | INTRAVENOUS | Status: DC
  Administered 2013-07-14: 750 mg via INTRAVENOUS
  Filled 2013-07-13 (×2): qty 150

## 2013-07-13 MED ORDER — DEXTROSE 5 % IV SOLN
1.0000 g | Freq: Once | INTRAVENOUS | Status: AC
  Administered 2013-07-13: 1 g via INTRAVENOUS
  Filled 2013-07-13: qty 1

## 2013-07-13 MED ORDER — DEXTROSE 5 % IV SOLN
1.0000 g | Freq: Once | INTRAVENOUS | Status: AC
  Administered 2013-07-13: 1 g via INTRAVENOUS
  Filled 2013-07-13: qty 10

## 2013-07-13 MED ORDER — ENOXAPARIN SODIUM 40 MG/0.4ML ~~LOC~~ SOLN
40.0000 mg | SUBCUTANEOUS | Status: DC
Start: 2013-07-13 — End: 2013-07-15
  Administered 2013-07-13 – 2013-07-14 (×2): 40 mg via SUBCUTANEOUS
  Filled 2013-07-13 (×3): qty 0.4

## 2013-07-13 MED ORDER — ASPIRIN 325 MG PO TABS
325.0000 mg | ORAL_TABLET | Freq: Every day | ORAL | Status: DC
  Administered 2013-07-14 – 2013-07-15 (×2): 325 mg via ORAL
  Filled 2013-07-13 (×2): qty 1

## 2013-07-13 MED ORDER — NITROGLYCERIN 0.4 MG SL SUBL: 0.4000 mg | SUBLINGUAL_TABLET | SUBLINGUAL | Status: DC | PRN

## 2013-07-13 NOTE — ED Notes (Addendum)
Patient states that yesterday she visited her PCP who changed her BP medications due to a peripheral rash. Yesterday afternoon pt states feeling chills and shakes, abdominal pain, vomiting, diarrhea, fever 103 degrees, anterior chest pain/tenderness, cough. Per EMS patient is at mental baseline.

## 2013-07-13 NOTE — Progress Notes (Signed)
Utilization Review completed.  Jadelin Eng RN CM  

## 2013-07-13 NOTE — H&P (Signed)
[facility] History and Physical  [name] [name] [id number] DOB: 04/11/1924 DOA: 07/13/2013  Referring physician: EDP PCP: Chancy Hurter, MD   Chief Complaint: sob, dysuria, fever since 1 to 2 days  HPI: [name] [name] is a 80 y.o. female with prior h/o hypertension, copd, on home oxygen at night, came in for worsening fevers and chills, associated with sob with productive cough and dysuria since 1 to 2 days. She went to see he rPCP yesterday and was given antibiotics for UTI. On arrival to ED, she was foun dto have left pleural effusion with possible pneumonia underneath, fever, and UTI. Pt also reports having nausea, vomiting and diarrhea last night. She is referred to hospitalist for admission for pneumonia and UTI.    Review of Systems:  Constitutional:  Positive for fever and chills.  HEENT:  No headaches, Difficulty swallowing,Tooth/dental problems,Sore throat,  No sneezing, itching, ear ache, nasal congestion, post nasal drip,  Cardio-vascular:  No chest pain, Orthopnea, PND, swelling in lower extremities, anasarca, dizziness, palpitations  GI:  NAUSEA vomiting and diarrhea last night.  Resp:   positive for Sob, cough with sputum.  Skin:  no rash or lesions.  GU:  Positive for dysuria. No flank pain.  Musculoskeletal:  No joint pain or swelling. No decreased range of motion. No back pain.  Psych:  No change in mood or affect. No depression or anxiety. No memory loss.   Past Medical History  Diagnosis Date  . ANXIETY 08/16/2008  . CAD, UNSPECIFIED SITE 10/17/2008  . CHOLELITHIASIS 08/23/2008  . COPD 01/04/2010    FeV1 101%, DLCO64% 2008  . 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 DIVERTICULOSIS-COLON 04/07/2008  . GERD 04/19/2007  . HYPERSOMNIA, ASSOCIATED WITH SLEEP APNEA 02/01/2007  . HYPERTENSION 11/12/2006  . MITRAL VALVE PROLAPSE, HX OF 10/07/2008  . NEOPLASM, MALIGNANT, BREAST, RIGHT 02/15/2008  . OSTEOPOROSIS 04/04/2008  . Raynaud's syndrome 04/25/2009    . Airway obstruction   . History of diverticulitis of colon   . Tortuous colon   . Anal stricture   . Chronic constipation   . Fatty liver   . Hiatal hernia   . Esophageal stricture   . IBS (irritable bowel syndrome)   . Pulmonary fibrosis   . Sleep apnea   . COPD (chronic obstructive pulmonary disease)   . Colitis     sigmoid  . Candida esophagitis   . Diverticulitis   . Hearing aid worn     bilateral  . Atrial fibrillation     she denies known hx of afib 02/24/13  . Complication of anesthesia     difficult intubation   Past Surgical History  Procedure Laterality Date  . Abdominal hysterectomy    . Bilateral salpingoophorectomy      s/p prolapsed bladder  . Bladder surgery      prolapsed  . Thoracotomy      granuloma, pulmonary-thoracotomy  . Breast surgery  07-21-07    mastectomy (right), all margins neg and LNs neg   . Appendectomy    . Tonsillectomy    . Rotator cuff repair    . Mastectomy      right  . Breast cyst excision      left  . Mouth surgery    . Eye surgery      cataract removal bilateral  . Melanoma excision      rt clavicle Dr [facility] office)  . Lung surgery     Social History:  reports that she quit smoking about 30  years ago. Her smoking use included Cigarettes. She has a 60 pack-year smoking history. She has never used smokeless tobacco. She reports that she drinks alcohol. She reports that she does not use illicit drugs.  Allergies  Allergen Reactions  . Azithromycin Hives, Diarrhea, Dermatitis and Rash  . Ciprofloxacin Swelling  . Levaquin [Levofloxacin] Hives  . Penicillins Hives    has had mild doses that she did not have reactions to  . Prednisone     Unknown   . Sulfamethoxazole Nausea And Vomiting    Family History  Problem Relation Age of Onset  . Parkinsonism Father   . Coronary artery disease Father   . Breast cancer      aunt  . Colon cancer      aunt, age 37  . Uterine cancer      aunt  . Stroke Mother   .  Heart failure Mother   . Cancer Maternal Aunt     breast     Prior to Admission medications   Medication Sig Start Date End Date Taking? Authorizing Provider  AMBULATORY NON FORMULARY MEDICATION Oxygen at bedtime-2 or 2 1/2 liters   Yes Historical Provider, MD  amLODipine (NORVASC) 5 MG tablet Take 1 tablet (5 mg total) by mouth daily. 07/12/13  Yes Lisabeth Pick, MD  aspirin 325 MG tablet Take 325 mg by mouth daily.   Yes Historical Provider, MD  diazepam (VALIUM) 5 MG tablet Take 1 tablet (5 mg total) by mouth at bedtime. 07/12/13  Yes Lisabeth Pick, MD  hydrOXYzine (ATARAX/VISTARIL) 10 MG tablet Take 10 mg by mouth every 4 (four) hours as needed for itching.   Yes Historical Provider, MD  losartan (COZAAR) 50 MG tablet Take 50 mg by mouth daily.   Yes Historical Provider, MD  nitrofurantoin (MACRODANTIN) 100 MG capsule Take 1 capsule (100 mg total) by mouth 2 (two) times daily. 07/12/13  Yes Lisabeth Pick, MD  nitroGLYCERIN (NITROSTAT) 0.4 MG SL tablet Place 0.4 mg under the tongue every 5 (five) minutes as needed for chest pain.   Yes Historical Provider, MD  omeprazole (PRILOSEC) 20 MG capsule Take 20 mg by mouth daily.   Yes Historical Provider, MD  polyethylene glycol (MIRALAX / GLYCOLAX) packet Take 17 g by mouth daily as needed (for constipation).   Yes Historical Provider, MD   Physical Exam: Filed Vitals:   07/13/13 1721  BP:   Pulse:   Temp: 99.1 F (37.3 C)  Resp:     BP 123/51  Pulse 79  Temp(Src) 99.1 F (37.3 C) (Oral)  Resp 25  Ht 5\' 1"  (1.549 m)  Wt 53.978 kg (119 lb)  BMI 22.50 kg/m2  SpO2 100%  General:  Appears calm and comfortable Eyes: PERRL, normal lids, irises & conjunctiva ENT: grossly normal hearing, lips & tongue Neck: no LAD, masses or thyromegaly Cardiovascular: RRR, no m/r/g. No LE edema. Respiratory: CTA bilaterally, no w/r/r. Normal respiratory effort. Abdomen: soft, ntnd Skin: no rash or induration seen on limited exam Musculoskeletal:  grossly normal tone BUE/BLE Psychiatric: grossly normal mood and affect, speech fluent and appropriate Neurologic: grossly non-focal.          Labs on Admission:  Basic Metabolic Panel:  Recent Labs Lab 07/13/13 1454  NA 138  K 3.8  CL 100  CO2 25  GLUCOSE 128*  BUN 11  CREATININE 0.83  CALCIUM 9.1   Liver Function Tests:  Recent Labs Lab 07/13/13 1531  AST 16  ALT  10  ALKPHOS 64  BILITOT 1.1  PROT 6.7  ALBUMIN 3.6   No results found for this basename: LIPASE, AMYLASE,  in the last 168 hours No results found for this basename: AMMONIA,  in the last 168 hours CBC:  Recent Labs Lab 07/13/13 1454  WBC 20.9*  NEUTROABS 19.1*  HGB 12.8  HCT 37.3  MCV 85.9  PLT 128*   Cardiac Enzymes: No results found for this basename: CKTOTAL, CKMB, CKMBINDEX, TROPONINI,  in the last 168 hours  BNP (last 3 results)  Recent Labs  07/13/13 1454  PROBNP 489.0*   CBG: No results found for this basename: GLUCAP,  in the last 168 hours  Radiological Exams on Admission: Dg Chest Port 1 View  07/13/2013   CLINICAL DATA:  FEVER COUGH FEVER COUGH  EXAM: PORTABLE CHEST - 1 VIEW  COMPARISON:  CT CHEST W/O CM dated 07/08/2013; DG CHEST 2 VIEW dated 03/30/2013  FINDINGS: Low lung volumes. The cardiac silhouette is enlarged. A atherosclerotic calcifications are identified within the aorta. Small to moderate left pleural effusions appreciated. There is diffuse thickening of the interstitial markings slightly more prominent when compared to previous study. This finding is accentuated by the low lung volumes. No further focal regions of consolidation appreciated. Remote rib fractures identified within the posterior lateral aspect of the left hemi thorax. The bones are osteopenic.  IMPRESSION: Chronic bronchitic changes these findings accentuated by the patient's low lung volumes. Underlying component of mild pulmonary edema cannot be excluded.  Small to moderate left pleural effusion. A  concomitant area of pulmonary infiltrate and/or atelectasis cannot be excluded. Surveillance evaluation with PA and lateral chest radiographs is recommended.   Electronically Signed   By: Margaree Mackintosh M.D.   On: 07/13/2013 15:30    EKG: sinus at 75  Assessment/Plan Active Problems:   HCAP (healthcare-associated pneumonia)   Health care associated pneumonia and left pleural effusion: - admitted to telemetry, - started patient on vancomycin and Azactam for penicillin allergy - blood cultures drawn and sputum cultures ordered.  - urine for strepto and legionella ordered.  - nasal oxygen as needed to keep sats>90% - repeat cxr in 48 hours.    2. UTI: -  Urine cultures ordered.  - on IV antibiotics.   3. Hypertension;  - controlled.   4. Fever/ leukocytosis/ SIRS:   - possibly from #1 and #2.  - continue with IVantibiotics.   5. Mild thrombocytopenia: - mild no evidence of bleeding.  - will monitor.    DVT prophylaxis.   Code Status: full code Family Communication: daughter at bedside, discussed the plan of care with her.  Disposition Plan: pending PT EVAL.  Time spent: [facility] Pager [phone]

## 2013-07-13 NOTE — ED Notes (Signed)
Bed: WA17 Expected date:  Expected time:  Means of arrival:  Comments: ems 

## 2013-07-13 NOTE — Progress Notes (Signed)
Quick Note:  Called, spoke with pt. Informed her of CT Chest results and recs per PW. She verbalized understanding and voiced no further questions or concerns at this time. ______

## 2013-07-13 NOTE — ED Provider Notes (Signed)
CSN: [id number]     Arrival date & time 07/13/13  1408 History   First MD Initiated Contact with Patient 07/13/13 1502     Chief Complaint  Patient presents with  . Chest Pain  . Abdominal Pain     (Consider location/radiation/quality/duration/timing/severity/associated sxs/prior Treatment) HPI Comments: 80 year old female presents with a chief complaint of fever and chills since yesterday. The patient states that started yesterday, and has had a fever up to 103. She's been having vomiting and diarrhea since yesterday. She was diagnosed with a UTI at her PCPs office and was started on Macrobid and is only taken one dose. The patient states she's been having chest "numbness" since yesterday as well. Has a little bit of diarrhea is causing some abdominal pain but denies belly pain at this time. She's also had a cough with mucus production. She states is similar to prior time she's had pneumonia. She's been significantly more weak than usual and having trouble ambulating with her walker her like normal. Weakness is diffuse and not focal.   Past Medical History  Diagnosis Date  . ANXIETY 08/16/2008  . CAD, UNSPECIFIED SITE 10/17/2008  . CHOLELITHIASIS 08/23/2008  . COPD 01/04/2010    FeV1 101%, DLCO64% 2008  . 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 DIVERTICULOSIS-COLON 04/07/2008  . GERD 04/19/2007  . HYPERSOMNIA, ASSOCIATED WITH SLEEP APNEA 02/01/2007  . HYPERTENSION 11/12/2006  . MITRAL VALVE PROLAPSE, HX OF 10/07/2008  . NEOPLASM, MALIGNANT, BREAST, RIGHT 02/15/2008  . OSTEOPOROSIS 04/04/2008  . Raynaud's syndrome 04/25/2009  . Airway obstruction   . History of diverticulitis of colon   . Tortuous colon   . Anal stricture   . Chronic constipation   . Fatty liver   . Hiatal hernia   . Esophageal stricture   . IBS (irritable bowel syndrome)   . Pulmonary fibrosis   . Sleep apnea   . COPD (chronic obstructive pulmonary disease)   . Colitis     sigmoid  . Candida esophagitis   .  Diverticulitis   . Hearing aid worn     bilateral  . Atrial fibrillation     she denies known hx of afib 02/24/13  . Complication of anesthesia     difficult intubation   Past Surgical History  Procedure Laterality Date  . Abdominal hysterectomy    . Bilateral salpingoophorectomy      s/p prolapsed bladder  . Bladder surgery      prolapsed  . Thoracotomy      granuloma, pulmonary-thoracotomy  . Breast surgery  07-21-07    mastectomy (right), all margins neg and LNs neg   . Appendectomy    . Tonsillectomy    . Rotator cuff repair    . Mastectomy      right  . Breast cyst excision      left  . Mouth surgery    . Eye surgery      cataract removal bilateral  . Melanoma excision      rt clavicle Dr Rhoderick Moody office)  . Lung surgery     Family History  Problem Relation Age of Onset  . Parkinsonism Father   . Coronary artery disease Father   . Breast cancer      aunt  . Colon cancer      aunt, age 74  . Uterine cancer      aunt  . Stroke Mother   . Heart failure Mother   . Cancer Maternal Aunt     breast  History  Substance Use Topics  . Smoking status: Former Smoker -- 2.00 packs/day for 30 years    Types: Cigarettes    Quit date: 05/13/1983  . Smokeless tobacco: Never Used  . Alcohol Use: Yes     Comment: 1-2 glaases wine per day   OB History   Grav Para Term Preterm Abortions TAB SAB Ect Mult Living                 Review of Systems  Constitutional: Positive for fever and chills.  Respiratory: Positive for cough and shortness of breath.   Cardiovascular: Positive for chest pain. Negative for leg swelling.  Gastrointestinal: Positive for nausea, vomiting, abdominal pain and diarrhea.  Genitourinary: Positive for dysuria.  Neurological: Positive for weakness.  All other systems reviewed and are negative.      Allergies  Azithromycin; Ciprofloxacin; Levaquin; Penicillins; Prednisone; and Sulfamethoxazole  Home Medications   Current Outpatient  Rx  Name  Route  Sig  Dispense  Refill  . AMBULATORY NON FORMULARY MEDICATION      Oxygen at bedtime-2 or 2 1/2 liters         . amLODipine (NORVASC) 5 MG tablet   Oral   Take 1 tablet (5 mg total) by mouth daily.   90 tablet   3   . aspirin 325 MG tablet   Oral   Take 325 mg by mouth daily.         . diazepam (VALIUM) 5 MG tablet   Oral   Take 1 tablet (5 mg total) by mouth at bedtime.   30 tablet   3   . hydrOXYzine (ATARAX/VISTARIL) 10 MG tablet   Oral   Take 10 mg by mouth every 4 (four) hours as needed for itching.         . losartan (COZAAR) 50 MG tablet   Oral   Take 50 mg by mouth daily.         . nitrofurantoin (MACRODANTIN) 100 MG capsule   Oral   Take 1 capsule (100 mg total) by mouth 2 (two) times daily.   10 capsule   0   . nitroGLYCERIN (NITROSTAT) 0.4 MG SL tablet   Sublingual   Place 0.4 mg under the tongue every 5 (five) minutes as needed for chest pain.         Marland Kitchen omeprazole (PRILOSEC) 20 MG capsule   Oral   Take 20 mg by mouth daily.         . polyethylene glycol (MIRALAX / GLYCOLAX) packet   Oral   Take 17 g by mouth daily as needed (for constipation).          BP 96/77  Pulse 89  Temp(Src) 101.1 F (38.4 C) (Oral)  Resp 18  SpO2 97% Physical Exam  Vitals reviewed. Constitutional: She is oriented to person, place, and time. She appears well-developed and well-nourished. No distress.  HENT:  Head: Normocephalic and atraumatic.  Right Ear: External ear normal.  Left Ear: External ear normal.  Nose: Nose normal.  Eyes: Right eye exhibits no discharge. Left eye exhibits no discharge.  Cardiovascular: Normal rate, regular rhythm and normal heart sounds.   Pulmonary/Chest: Effort normal. She has rhonchi (faint) in the right lower field and the left lower field. She exhibits tenderness.  Abdominal: Soft. She exhibits no distension. There is no tenderness.  Neurological: She is alert and oriented to person, place, and time.   Skin: Skin is warm and dry.  ED Course  Procedures (including critical care time) Labs Review Labs Reviewed  CBC - Abnormal; Notable for the following:    WBC 20.9 (*)    All other components within normal limits  BASIC METABOLIC PANEL - Abnormal; Notable for the following:    Glucose, Bld 128 (*)    GFR calc non Af Amer 62 (*)    GFR calc Af Amer 71 (*)    All other components within normal limits  PRO B NATRIURETIC PEPTIDE - Abnormal; Notable for the following:    Pro B Natriuretic peptide (BNP) 489.0 (*)    All other components within normal limits  URINALYSIS, ROUTINE W REFLEX MICROSCOPIC - Abnormal; Notable for the following:    APPearance CLOUDY (*)    Nitrite POSITIVE (*)    Leukocytes, UA MODERATE (*)    All other components within normal limits  URINE MICROSCOPIC-ADD ON - Abnormal; Notable for the following:    Squamous Epithelial / LPF MANY (*)    Bacteria, UA MANY (*)    All other components within normal limits  URINE CULTURE  CULTURE, BLOOD (ROUTINE X 2)  CULTURE, BLOOD (ROUTINE X 2)  DIFFERENTIAL  HEPATIC FUNCTION PANEL  I-STAT CG4 LACTIC ACID, ED  Randolm Idol, ED   Imaging Review Dg Chest Port 1 View  07/13/2013   CLINICAL DATA:  FEVER COUGH FEVER COUGH  EXAM: PORTABLE CHEST - 1 VIEW  COMPARISON:  CT CHEST W/O CM dated 07/08/2013; DG CHEST 2 VIEW dated 03/30/2013  FINDINGS: Low lung volumes. The cardiac silhouette is enlarged. A atherosclerotic calcifications are identified within the aorta. Small to moderate left pleural effusions appreciated. There is diffuse thickening of the interstitial markings slightly more prominent when compared to previous study. This finding is accentuated by the low lung volumes. No further focal regions of consolidation appreciated. Remote rib fractures identified within the posterior lateral aspect of the left hemi thorax. The bones are osteopenic.  IMPRESSION: Chronic bronchitic changes these findings accentuated by the  patient's low lung volumes. Underlying component of mild pulmonary edema cannot be excluded.  Small to moderate left pleural effusion. A concomitant area of pulmonary infiltrate and/or atelectasis cannot be excluded. Surveillance evaluation with PA and lateral chest radiographs is recommended.   Electronically Signed   By: Margaree Mackintosh M.D.   On: 07/13/2013 15:30     EKG Interpretation   Date/Time:  Wednesday July 13 2013 14:51:46 EST Ventricular Rate:  75 PR Interval:  216 QRS Duration: 94 QT Interval:  373 QTC Calculation: 417 R Axis:   39 Text Interpretation:  Sinus rhythm Borderline prolonged PR interval  Probable left atrial enlargement Borderline repolarization abnormality No  significant change since last tracing Confirmed by Maleni Seyer  MD, Crescencio Jozwiak  ([id number]) on 07/13/2013 3:07:11 PM      MDM   Final diagnoses:  UTI (urinary tract infection)  Sepsis  CAP (community acquired pneumonia)    Patient is well appearing but mild tachypnea. Qualifies as sepsis given her elevated WBC and fever. Stable in the ED. UTI today and yesterday, also concern for respiratory cause as well given Xray and cough. D/w Pharmacist, who given her complex allergy list recommends doxy in addition to rocephin (has had cephalosporins in past w/o issue) to cover both. Will admit to medicine.     Ephraim Hamburger, MD 07/13/13 [phone]

## 2013-07-13 NOTE — Progress Notes (Addendum)
ANTIBIOTIC CONSULT NOTE - INITIAL  Pharmacy Consult for:  Vancomycin and Aztreonam Indication:  Pneumonia (HCAP)  Allergies  Allergen Reactions  . Azithromycin Hives, Diarrhea, Dermatitis and Rash  . Ciprofloxacin Swelling  . Levaquin [Levofloxacin] Hives  . Penicillins Hives    has had mild doses that she did not have reactions to  . Prednisone     Unknown   . Sulfamethoxazole Nausea And Vomiting    Patient Measurements: Height: 5\' 1"  (154.9 cm) Weight: 119 lb (53.978 kg) IBW/kg (Calculated) : 47.8   Vital Signs: Temp: 99.2 F (37.3 C) (03/04 1822) Temp src: Oral (03/04 1822) BP: 102/44 mmHg (03/04 1822) Pulse Rate: 80 (03/04 1822)  Labs:  Recent Labs  07/13/13 1454  WBC 20.9*  HGB 12.8  PLT 128*  CREATININE 0.83   Estimated Creatinine Clearance: 36 ml/min (by C-G formula based on Cr of 0.83).    Medical History: Past Medical History  Diagnosis Date  . ANXIETY 08/16/2008  . CAD, UNSPECIFIED SITE 10/17/2008  . CHOLELITHIASIS 08/23/2008  . COPD 01/04/2010    FeV1 101%, DLCO64% 2008  . 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 DIVERTICULOSIS-COLON 04/07/2008  . GERD 04/19/2007  . HYPERSOMNIA, ASSOCIATED WITH SLEEP APNEA 02/01/2007  . HYPERTENSION 11/12/2006  . MITRAL VALVE PROLAPSE, HX OF 10/07/2008  . NEOPLASM, MALIGNANT, BREAST, RIGHT 02/15/2008  . OSTEOPOROSIS 04/04/2008  . Raynaud's syndrome 04/25/2009  . Airway obstruction   . History of diverticulitis of colon   . Tortuous colon   . Anal stricture   . Chronic constipation   . Fatty liver   . Hiatal hernia   . Esophageal stricture   . IBS (irritable bowel syndrome)   . Pulmonary fibrosis   . Sleep apnea   . COPD (chronic obstructive pulmonary disease)   . Colitis     sigmoid  . Candida esophagitis   . Diverticulitis   . Hearing aid worn     bilateral  . Atrial fibrillation     she denies known hx of afib 02/24/13  . Complication of anesthesia     difficult intubation     Medications:   Scheduled:  . [START ON 07/14/2013] aspirin  325 mg Oral Daily  . diazepam  5 mg Oral QHS  . enoxaparin (LOVENOX) injection  40 mg Subcutaneous Q24H  . vancomycin  1,000 mg Intravenous Once   Assessment: Asked to assist with Vancomycin and Aztreonam therapy for this 80 year-old female with healthcare-associated pneumonia.    Goals of Therapy:   Vancomycin trough levels 15-20 mcg/ml  Antibiotic doses appropriate for renal function  Eradication of infection  Plan:   Vancomycin 1000 mg IV x 1 dose, then 750 mg every 24 hours.  Aztreonam 1 gram IV every 8 hours.  Vancomycin levels as needed to guide dosing.  [place] 07/13/2013 8:52 PM

## 2013-07-14 DIAGNOSIS — I517 Cardiomegaly: Secondary | ICD-10-CM

## 2013-07-14 DIAGNOSIS — J438 Other emphysema: Secondary | ICD-10-CM | POA: Diagnosis not present

## 2013-07-14 DIAGNOSIS — N390 Urinary tract infection, site not specified: Secondary | ICD-10-CM | POA: Diagnosis not present

## 2013-07-14 DIAGNOSIS — J189 Pneumonia, unspecified organism: Secondary | ICD-10-CM | POA: Diagnosis not present

## 2013-07-14 LAB — CBC WITH DIFFERENTIAL/PLATELET
Basophils Absolute: 0 10*3/uL (ref 0.0–0.1)
Basophils Relative: 0 % (ref 0–1)
EOS PCT: 3 % (ref 0–5)
Eosinophils Absolute: 0.3 10*3/uL (ref 0.0–0.7)
HCT: 37.7 % (ref 36.0–46.0)
Hemoglobin: 12.4 g/dL (ref 12.0–15.0)
LYMPHS ABS: 1.1 10*3/uL (ref 0.7–4.0)
Lymphocytes Relative: 9 % — ABNORMAL LOW (ref 12–46)
MCH: 29 pg (ref 26.0–34.0)
MCHC: 32.9 g/dL (ref 30.0–36.0)
MCV: 88.1 fL (ref 78.0–100.0)
MONO ABS: 0.5 10*3/uL (ref 0.1–1.0)
Monocytes Relative: 4 % (ref 3–12)
Neutro Abs: 10.5 10*3/uL — ABNORMAL HIGH (ref 1.7–7.7)
Neutrophils Relative %: 85 % — ABNORMAL HIGH (ref 43–77)
Platelets: 147 10*3/uL — ABNORMAL LOW (ref 150–400)
RBC: 4.28 MIL/uL (ref 3.87–5.11)
RDW: 14.1 % (ref 11.5–15.5)
WBC: 12.4 10*3/uL — ABNORMAL HIGH (ref 4.0–10.5)

## 2013-07-14 LAB — TSH: TSH: 1.01 u[IU]/mL (ref 0.350–4.500)

## 2013-07-14 LAB — LEGIONELLA ANTIGEN, URINE: Legionella Antigen, Urine: NEGATIVE

## 2013-07-14 LAB — HIV ANTIBODY (ROUTINE TESTING W REFLEX): HIV: NONREACTIVE

## 2013-07-14 LAB — INFLUENZA PANEL BY PCR (TYPE A & B)
H1N1 flu by pcr: NOT DETECTED
Influenza A By PCR: NEGATIVE
Influenza B By PCR: NEGATIVE

## 2013-07-14 NOTE — Progress Notes (Signed)
[facility] PROGRESS NOTE  [name] [name] ZDG:[id number] DOB: 1924-01-18 DOA: 07/13/2013 PCP: Chancy Hurter, MD  Assessment/Plan: Health care associated pneumonia and left pleural effusion:  - admitted to telemetry,  - started patient on vancomycin and Azactam for penicillin allergy  - blood cultures drawn and sputum cultures ordered.  - urine for strepto and legionella ordered.  - nasal oxygen as needed to keep sats>90%  - repeat cxr in 48 hours.  2. UTI:  - Urine cultures ordered.  - on IV antibiotics.  3. Hypertension;  - controlled.  4. Fever/ leukocytosis/ SIRS:  - possibly from #1 and #2.  - continue with IVantibiotics.  5. Mild thrombocytopenia:  - mild no evidence of bleeding.  - will monitor.  DVT prophylaxis.  Code Status: full code  Family Communication: daughter at bedside, discussed the plan of care with her.  Disposition Plan: pending PT EVAL.    Consultants:  none  Procedures:  CXR  Antibiotics:  Vancomycin  azactam  HPI/Subjective: Comfortable , no new complaints.   Objective: Filed Vitals:   07/14/13 1506  BP: 95/33  Pulse: 61  Temp: 98.5 F (36.9 C)  Resp: 20    Intake/Output Summary (Last 24 hours) at 07/14/13 1845 Last data filed at 07/14/13 1709  Gross per 24 hour  Intake    880 ml  Output    550 ml  Net    330 ml   Filed Weights   07/13/13 1807  Weight: 53.978 kg (119 lb)    Exam:   General:  Alert afebrile comfortable  Cardiovascular: s1s2  Respiratory: ctab  Abdomen: soft NT ND BS+  Musculoskeletal: no pedal edema.   Data Reviewed: Basic Metabolic Panel:  Recent Labs Lab 07/13/13 1454  NA 138  K 3.8  CL 100  CO2 25  GLUCOSE 128*  BUN 11  CREATININE 0.83  CALCIUM 9.1   Liver Function Tests:  Recent Labs Lab 07/13/13 1531  AST 16  ALT 10  ALKPHOS 64  BILITOT 1.1  PROT 6.7  ALBUMIN 3.6   No results found for this basename: LIPASE, AMYLASE,  in the last 168 hours No results found for  this basename: AMMONIA,  in the last 168 hours CBC:  Recent Labs Lab 07/13/13 1454 07/14/13 0948  WBC 20.9* 12.4*  NEUTROABS 19.1* 10.5*  HGB 12.8 12.4  HCT 37.3 37.7  MCV 85.9 88.1  PLT 128* 147*   Cardiac Enzymes: No results found for this basename: CKTOTAL, CKMB, CKMBINDEX, TROPONINI,  in the last 168 hours BNP (last 3 results)  Recent Labs  07/13/13 1454  PROBNP 489.0*   CBG: No results found for this basename: GLUCAP,  in the last 168 hours  Recent Results (from the past 240 hour(s))  URINE CULTURE     Status: None   Collection Time    07/12/13  9:37 AM      Result Value Ref Range Status   Colony Count >=100,000 COLONIES/ML   Preliminary   Preliminary Report Gram Negative Rods   Preliminary  CULTURE, BLOOD (ROUTINE X 2)     Status: None   Collection Time    07/13/13  2:54 PM      Result Value Ref Range Status   Specimen Description BLOOD LEFT ARM   Final   Special Requests BOTTLES DRAWN AEROBIC AND ANAEROBIC 5ML   Final   Culture  Setup Time     Final   Value: 07/13/2013 20:58     Performed at [facility]   Culture     Final   Value:        BLOOD CULTURE RECEIVED NO GROWTH TO DATE CULTURE WILL BE HELD FOR 5 DAYS BEFORE ISSUING A FINAL NEGATIVE REPORT     Performed at [facility]   Report Status PENDING   Incomplete  CULTURE, BLOOD (ROUTINE X 2)     Status: None   Collection Time    07/13/13  2:54 PM      Result Value Ref Range Status   Specimen Description BLOOD LEFT ARM   Final   Special Requests BOTTLES DRAWN AEROBIC AND ANAEROBIC 5ML   Final   Culture  Setup Time     Final   Value: 07/13/2013 20:58     Performed at [facility]   Culture     Final   Value:        BLOOD CULTURE RECEIVED NO GROWTH TO DATE CULTURE WILL BE HELD FOR 5 DAYS BEFORE ISSUING A FINAL NEGATIVE REPORT     Performed at [facility]   Report Status PENDING   Incomplete     Studies: Dg Chest Port 1 View  07/13/2013   CLINICAL DATA:  FEVER COUGH  FEVER COUGH  EXAM: PORTABLE CHEST - 1 VIEW  COMPARISON:  CT CHEST W/O CM dated 07/08/2013; DG CHEST 2 VIEW dated 03/30/2013  FINDINGS: Low lung volumes. The cardiac silhouette is enlarged. A atherosclerotic calcifications are identified within the aorta. Small to moderate left pleural effusions appreciated. There is diffuse thickening of the interstitial markings slightly more prominent when compared to previous study. This finding is accentuated by the low lung volumes. No further focal regions of consolidation appreciated. Remote rib fractures identified within the posterior lateral aspect of the left hemi thorax. The bones are osteopenic.  IMPRESSION: Chronic bronchitic changes these findings accentuated by the patient's low lung volumes. Underlying component of mild pulmonary edema cannot be excluded.  Small to moderate left pleural effusion. A concomitant area of pulmonary infiltrate and/or atelectasis cannot be excluded. Surveillance evaluation with PA and lateral chest radiographs is recommended.   Electronically Signed   By: Margaree Mackintosh M.D.   On: 07/13/2013 15:30    Scheduled Meds: . aspirin  325 mg Oral Daily  . aztreonam  1 g Intravenous Q8H  . diazepam  5 mg Oral QHS  . enoxaparin (LOVENOX) injection  40 mg Subcutaneous Q24H  . vancomycin  750 mg Intravenous Q24H   Continuous Infusions:   Active Problems:   HYPERTENSION   COPD with emphysema, gold stage A   HCAP (healthcare-associated pneumonia)   UTI (urinary tract infection)    Time spent: 25 minutes.     [facility]  [facility] Pager [phone]. If 7PM-7AM, please contact night-coverage at [email or web address], password [facility] 07/14/2013, 6:45 PM  LOS: 1 day

## 2013-07-14 NOTE — Progress Notes (Signed)
Echocardiogram 2D Echocardiogram has been performed.  [name] [name] 07/14/2013, 2:24 PM 

## 2013-07-14 NOTE — Progress Notes (Signed)
INITIAL NUTRITION ASSESSMENT  DOCUMENTATION CODES Per approved criteria  -Not Applicable   INTERVENTION: Recommend 2PM nourishment Will continue to monitor  NUTRITION DIAGNOSIS: Unintentional wt loss related to inadequate oral intake as evidenced by 15 lbs weight loss.   Goal: Pt to meet >/= 90% of their estimated nutrition needs    Monitor:  Total protein/energy intake, labs, weights, GI profile  Reason for Assessment: MST  80 y.o. female  Admitting Dx: <principal problem not specified>  ASSESSMENT: [name] [name] is a 80 y.o. female with prior h/o hypertension, copd, on home oxygen at night, came in for worsening fevers and chills, associated with sob with productive cough and dysuria since 1 to 2 days. She went to see he rPCP yesterday and was given antibiotics for UTI. On arrival to ED, she was foun dto have left pleural effusion with possible pneumonia underneath, fever, and UTI. Pt also reports having nausea, vomiting and diarrhea last night  -Pt's daughter reported an unintentional wt loss of 15 lbs in November that occurred over a several week period while she was hospitalized. -Has been unable to regain weight -Diet recall indicates 2 meals/day as pt sleeping through breakfast. Lives at [facility]. Consumes largest meal at lunch time and light meal at dinner. Will snack occasionally on cheese and crackers, peanut butter and crackers or sweets -Dislikes Ensure/Boost; family has tried to encourage pt to consume them in past -Has had n/v/diarrhea one day pta -Appetite is good, eating approximately 75% of meals. Denied any current nausea or vomiting post meal -Discussed addition of snacks to assist in meeting est nutr needs -Nutrition Focused Physical Exam:  Subcutaneous Fat:  Orbital Region: WDL Upper Arm Region: WDL Thoracic and Lumbar Region: WDL  Muscle:  Temple Region: WDl Clavicle Bone Region: WDL Clavicle and Acromion Bone Region: WDL Scapular Bone Region: WDL Dorsal  Hand: WDL Patellar Region: WDL Anterior Thigh Region: WDL Posterior Calf Region: WDL  Edema: none    Height: Ht Readings from Last 1 Encounters:  07/13/13 5\' 1"  (1.549 m)    Weight: Wt Readings from Last 1 Encounters:  07/13/13 119 lb (53.978 kg)    Ideal Body Weight: 105 lbs  % Ideal Body Weight: 113%  Wt Readings from Last 10 Encounters:  07/13/13 119 lb (53.978 kg)  07/12/13 119 lb (53.978 kg)  06/06/13 123 lb (55.792 kg)  04/25/13 124 lb (56.246 kg)  04/08/13 124 lb 6.4 oz (56.427 kg)  03/30/13 121 lb 14.6 oz (55.3 kg)  03/30/13 124 lb 3.2 oz (56.337 kg)  03/14/13 128 lb 15.5 oz (58.5 kg)  03/04/13 130 lb (58.968 kg)  02/24/13 132 lb 1 oz (59.903 kg)    Usual Body Weight: 134 lbs  % Usual Body Weight: 89%  BMI:  Body mass index is 22.5 kg/(m^2).  Normal  Estimated Nutritional Needs: Kcal: 1350-1550 Protein: 55-65 gram Fluid: >/=1600 ml/daily  Skin: red sacrum  Diet Order: Cardiac  EDUCATION NEEDS: -No education needs identified at this time   Intake/Output Summary (Last 24 hours) at 07/14/13 1113 Last data filed at 07/14/13 1031  Gross per 24 hour  Intake    350 ml  Output    450 ml  Net   -100 ml    Last BM: 3/04   Labs:   Recent Labs Lab 07/13/13 1454  NA 138  K 3.8  CL 100  CO2 25  BUN 11  CREATININE 0.83  CALCIUM 9.1  GLUCOSE 128*    CBG (last 3)  No  results found for this basename: GLUCAP,  in the last 72 hours  Scheduled Meds: . aspirin  325 mg Oral Daily  . aztreonam  1 g Intravenous Q8H  . diazepam  5 mg Oral QHS  . enoxaparin (LOVENOX) injection  40 mg Subcutaneous Q24H  . vancomycin  750 mg Intravenous Q24H    Continuous Infusions:   Past Medical History  Diagnosis Date  . ANXIETY 08/16/2008  . CAD, UNSPECIFIED SITE 10/17/2008  . CHOLELITHIASIS 08/23/2008  . COPD 01/04/2010    FeV1 101%, DLCO64% 2008  . 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 DIVERTICULOSIS-COLON 04/07/2008  . GERD 04/19/2007  .  HYPERSOMNIA, ASSOCIATED WITH SLEEP APNEA 02/01/2007  . HYPERTENSION 11/12/2006  . MITRAL VALVE PROLAPSE, HX OF 10/07/2008  . NEOPLASM, MALIGNANT, BREAST, RIGHT 02/15/2008  . OSTEOPOROSIS 04/04/2008  . Raynaud's syndrome 04/25/2009  . Airway obstruction   . History of diverticulitis of colon   . Tortuous colon   . Anal stricture   . Chronic constipation   . Fatty liver   . Hiatal hernia   . Esophageal stricture   . IBS (irritable bowel syndrome)   . Pulmonary fibrosis   . Sleep apnea   . COPD (chronic obstructive pulmonary disease)   . Colitis     sigmoid  . Candida esophagitis   . Diverticulitis   . Hearing aid worn     bilateral  . Atrial fibrillation     she denies known hx of afib 02/24/13  . Complication of anesthesia     difficult intubation    Past Surgical History  Procedure Laterality Date  . Abdominal hysterectomy    . Bilateral salpingoophorectomy      s/p prolapsed bladder  . Bladder surgery      prolapsed  . Thoracotomy      granuloma, pulmonary-thoracotomy  . Breast surgery  07-21-07    mastectomy (right), all margins neg and LNs neg   . Appendectomy    . Tonsillectomy    . Rotator cuff repair    . Mastectomy      right  . Breast cyst excision      left  . Mouth surgery    . Eye surgery      cataract removal bilateral  . Melanoma excision      rt clavicle Dr Rhoderick Moody office)  . Lung surgery      Atlee Abide MS RD LDN Clinical Dietitian [phone]

## 2013-07-15 ENCOUNTER — Ambulatory Visit: Payer: Medicare Other | Admitting: Critical Care Medicine

## 2013-07-15 ENCOUNTER — Inpatient Hospital Stay (HOSPITAL_COMMUNITY): Payer: Medicare Other

## 2013-07-15 DIAGNOSIS — R0609 Other forms of dyspnea: Secondary | ICD-10-CM | POA: Diagnosis not present

## 2013-07-15 DIAGNOSIS — I10 Essential (primary) hypertension: Secondary | ICD-10-CM | POA: Diagnosis not present

## 2013-07-15 DIAGNOSIS — J438 Other emphysema: Secondary | ICD-10-CM | POA: Diagnosis not present

## 2013-07-15 DIAGNOSIS — N390 Urinary tract infection, site not specified: Secondary | ICD-10-CM | POA: Diagnosis not present

## 2013-07-15 LAB — CBC WITH DIFFERENTIAL/PLATELET
BASOS PCT: 0 % (ref 0–1)
Basophils Absolute: 0 10*3/uL (ref 0.0–0.1)
EOS ABS: 0.3 10*3/uL (ref 0.0–0.7)
EOS PCT: 5 % (ref 0–5)
HEMATOCRIT: 36.5 % (ref 36.0–46.0)
Hemoglobin: 12.3 g/dL (ref 12.0–15.0)
Lymphocytes Relative: 22 % (ref 12–46)
Lymphs Abs: 1.4 10*3/uL (ref 0.7–4.0)
MCH: 29.4 pg (ref 26.0–34.0)
MCHC: 33.7 g/dL (ref 30.0–36.0)
MCV: 87.3 fL (ref 78.0–100.0)
MONO ABS: 0.5 10*3/uL (ref 0.1–1.0)
Monocytes Relative: 8 % (ref 3–12)
Neutro Abs: 4.1 10*3/uL (ref 1.7–7.7)
Neutrophils Relative %: 65 % (ref 43–77)
PLATELETS: 144 10*3/uL — AB (ref 150–400)
RBC: 4.18 MIL/uL (ref 3.87–5.11)
RDW: 13.9 % (ref 11.5–15.5)
WBC: 6.3 10*3/uL (ref 4.0–10.5)

## 2013-07-15 LAB — BASIC METABOLIC PANEL
BUN: 10 mg/dL (ref 6–23)
CALCIUM: 8.6 mg/dL (ref 8.4–10.5)
CO2: 24 mEq/L (ref 19–32)
CREATININE: 0.69 mg/dL (ref 0.50–1.10)
Chloride: 104 mEq/L (ref 96–112)
GFR calc Af Amer: 88 mL/min — ABNORMAL LOW (ref >90)
GFR, EST NON AFRICAN AMERICAN: 76 mL/min — AB (ref >90)
Glucose, Bld: 90 mg/dL (ref 70–99)
Potassium: 4 mEq/L (ref 3.7–5.3)
SODIUM: 139 meq/L (ref 137–147)

## 2013-07-15 LAB — URINE CULTURE: Colony Count: >=100000

## 2013-07-15 MED ORDER — CEFUROXIME AXETIL 250 MG PO TABS: 250.0000 mg | ORAL_TABLET | Freq: Two times a day (BID) | ORAL | Status: DC

## 2013-07-15 NOTE — Progress Notes (Signed)
PT Cancellation Note  Patient Details Name: [name] [name] MRN: [id number] DOB: Jul 27, 1923   Cancelled Treatment:    Reason Eval/Treat Not Completed: Fatigue/lethargy limiting ability to participate--pt would not participate at this time-"I can't do physical therapy right now.Marland KitchenMarland KitchenMarland KitchenI'm sorry". Will attempt to check back later today if schedule permits.    [name], MPT Pager: [phone]

## 2013-07-15 NOTE — Evaluation (Signed)
Occupational Therapy Evaluation Patient Details Name: [name] [name] MRN: [id number] DOB: 14-Jun-1923 Today's Date: 07/15/2013 Time: 1100-1120 OT Time Calculation (min): 20 min  OT Assessment / Plan / Recommendation History of present illness [name] [name] is a 80 y.o. female with prior h/o hypertension, copd, on home oxygen at night, came in for worsening fevers and chills, associated with sob with productive cough and dysuria since 1 to 2 days. She went to see he rPCP yesterday and was given antibiotics for UTI. On arrival to ED, she was foun dto have left pleural effusion with possible pneumonia underneath, fever, and UTI. Pt also reports having nausea, vomiting and diarrhea last night. She is referred to hospitalist for admission for pneumonia and UTI.    Clinical Impression   Pt presents to OT with decreased I with ADL activity due to problems listed below. Pt will benefit from skilled OT to increase I with ADL activity and return PLOF     OT Assessment  Patient needs continued OT Services    Follow Up Recommendations  Home health OT       Equipment Recommendations  None recommended by OT       Frequency  Min 2X/week           ADL  Grooming: Set up Where Assessed - Grooming: Unsupported sitting Where Assessed - Upper Body Bathing: Unsupported sitting Lower Body Bathing: Set up Where Assessed - Lower Body Bathing: Unsupported sit to stand Upper Body Dressing: Min guard Where Assessed - Upper Body Dressing: Unsupported sitting Lower Body Dressing: Min guard Where Assessed - Lower Body Dressing: Unsupported sit to stand Toilet Transfer: Min Psychiatric nurse Method: Sit to Loss adjuster, chartered: Comfort height toilet Where Assessed - Camera operator Manipulation and Hygiene: Standing    OT Diagnosis: Generalized weakness  OT Problem List: Decreased strength;Decreased activity tolerance   OT Goals(Current goals can be found in the care plan section) Acute  Rehab OT Goals Patient Stated Goal: get back to apartment OT Goal Formulation: With patient Time For Goal Achievement: 07/29/13 Potential to Achieve Goals: Good  Visit Information  Last OT Received On: 07/15/13 History of Present Illness: [name] [name] is a 80 y.o. female with prior h/o hypertension, copd, on home oxygen at night, came in for worsening fevers and chills, associated with sob with productive cough and dysuria since 1 to 2 days. She went to see he rPCP yesterday and was given antibiotics for UTI. On arrival to ED, she was foun dto have left pleural effusion with possible pneumonia underneath, fever, and UTI. Pt also reports having nausea, vomiting and diarrhea last night. She is referred to hospitalist for admission for pneumonia and UTI.        Prior Functioning     Home Living Family/patient expects to be discharged to:: Other (Comment) (independent living - with 3 meals a day) Additional Comments: from [place]; low commode; walk in shower no seat nor grab bars Prior Function Level of Independence: Independent with assistive device(s) Comments: pt now has a scooter- but does not use yet. stilll using walker Communication Communication: HOH         Vision/Perception Vision - History Patient Visual Report: No change from baseline   Cognition  Cognition Arousal/Alertness: Awake/alert Behavior During Therapy: WFL for tasks assessed/performed Overall Cognitive Status: Within Functional Limits for tasks assessed    Extremity/Trunk Assessment Upper Extremity Assessment Upper Extremity Assessment: Generalized weakness     Mobility Bed Mobility Overal bed mobility: Needs Assistance  Bed Mobility: Supine to Sit Supine to sit: Min assist Transfers Overall transfer level: Needs assistance Equipment used: Rolling walker (2 wheeled) Transfers: Sit to/from Stand Sit to Stand: Min assist           End of Session OT - End of Session Activity Tolerance:  Patient tolerated treatment well Patient left: in chair;with call bell/phone within reach Nurse Communication: Mobility status  GO     Betsy Pries 07/15/2013, 12:06 PM

## 2013-07-15 NOTE — Evaluation (Signed)
Physical Therapy Evaluation Patient Details Name: [name] [name] MRN: [id number] DOB: 13-Jun-1923 Today's Date: 07/15/2013 Time: 7616-0737 PT Time Calculation (min): 23 min  PT Assessment / Plan / Recommendation History of Present Illness  [name] [name] is a 80 y.o. female with prior h/o hypertension, copd, on home oxygen at night, came in for worsening fevers and chills, associated with sob with productive cough and dysuria since 1 to 2 days. She went to see he rPCP yesterday and was given antibiotics for UTI. On arrival to ED, she was foun dto have left pleural effusion with possible pneumonia underneath, fever, and UTI. Pt also reports having nausea, vomiting and diarrhea last night. She is referred to hospitalist for admission for pneumonia and UTI.   Clinical Impression  On eval, pt required Min assist for bed mobility, Min guard assist for standing, ambulation with walker. Pt only walked ~20 feet with walker due to pt declining to ambulate in hallway at that time and also x-ray tech waiting to transport pt. Pt reports she has 1/8 mile to get to dining room however she does have a scooter    PT Assessment  Patient needs continued PT services    Follow Up Recommendations  Home health PT    Does the patient have the potential to tolerate intense rehabilitation      Barriers to Discharge        Equipment Recommendations  None recommended by PT    Recommendations for Other Services OT consult   Frequency Min 3X/week    Precautions / Restrictions Precautions Precautions: None Restrictions Weight Bearing Restrictions: No   Pertinent Vitals/Pain       Mobility  Bed Mobility Overal bed mobility: Needs Assistance Bed Mobility: Supine to Sit Supine to sit: Min assist Transfers Overall transfer level: Needs assistance Equipment used: Rolling walker (2 wheeled) Transfers: Sit to/from Stand Sit to Stand: Min guard General transfer comment: Assist to rise, stabilize, control  descent Ambulation/Gait Ambulation/Gait assistance: Min guard Ambulation Distance (Feet): 20 Feet Assistive device: Rolling walker (2 wheeled) Gait Pattern/deviations: Step-through pattern General Gait Details: slow gait speed. Short distance due to pt declining to walk out into hallway and x-ray tech awaiting to transport pt for imaging. O2 sats 88% RA    Exercises     PT Diagnosis: Generalized weakness  PT Problem List: Decreased activity tolerance;Decreased strength;Decreased mobility PT Treatment Interventions: DME instruction;Gait training;Functional mobility training;Therapeutic activities;Therapeutic exercise;Patient/family education     PT Goals(Current goals can be found in the care plan section) Acute Rehab PT Goals Patient Stated Goal: get back to apartment PT Goal Formulation: With patient Time For Goal Achievement: 07/29/13 Potential to Achieve Goals: Good  Visit Information  Last PT Received On: 07/15/13 Assistance Needed: +1 Reason Eval/Treat Not Completed: Fatigue/lethargy limiting ability to participate (pt would not participate at this time. Will attempt to check back later today if schedule permits. ) History of Present Illness: [name] [name] is a 80 y.o. female with prior h/o hypertension, copd, on home oxygen at night, came in for worsening fevers and chills, associated with sob with productive cough and dysuria since 1 to 2 days. She went to see he rPCP yesterday and was given antibiotics for UTI. On arrival to ED, she was foun dto have left pleural effusion with possible pneumonia underneath, fever, and UTI. Pt also reports having nausea, vomiting and diarrhea last night. She is referred to hospitalist for admission for pneumonia and UTI.        Prior Functioning  Home Living Family/patient expects to be discharged to:: Other (Comment) (independent living - with 3 meals a day) Additional Comments: from [place]; low commode; walk in shower no seat nor grab  bars Prior Function Level of Independence: Independent with assistive device(s) Comments: pt now has a scooter- but does not use yet. stilll using walker Communication Communication: HOH    Cognition  Cognition Arousal/Alertness: Awake/alert Behavior During Therapy: WFL for tasks assessed/performed Overall Cognitive Status: Within Functional Limits for tasks assessed    Extremity/Trunk Assessment Upper Extremity Assessment Upper Extremity Assessment: Defer to OT evaluation Lower Extremity Assessment Lower Extremity Assessment: Generalized weakness Cervical / Trunk Assessment Cervical / Trunk Assessment: Kyphotic   Balance    End of Session PT - End of Session Activity Tolerance: Patient tolerated treatment well Patient left: in chair;with call bell/phone within reach;with family/visitor present  GP     [name], MPT Pager: [phone]

## 2013-07-15 NOTE — Progress Notes (Signed)
Spoke with pt and daughter at bedside concerning discharge plans with Home Health. Both selected [place] for Home Health.

## 2013-07-15 NOTE — Discharge Summary (Signed)
Physician Discharge Summary  [name] [name] BTD:[id number] DOB: Aug 05, 1923 DOA: 07/13/2013  PCP: Chancy Hurter, MD  Admit date: 07/13/2013 Discharge date: 07/15/2013  Time spent: 30 minutes  Recommendations for Outpatient Follow-up:  1. Follow upwith PCP in one week 2. Follow [facility] PT/OT  Discharge Diagnoses:  Active Problems:   HYPERTENSION   COPD with emphysema, gold stage A   HCAP (healthcare-associated pneumonia)   UTI (urinary tract infection)   Discharge Condition: improved.   Diet recommendation: low sodium diet  Filed Weights   07/13/13 1807  Weight: 53.978 kg (119 lb)    History of present illness:  [name] [name] is a 80 y.o. female with prior h/o hypertension, copd, on home oxygen at night, came in for worsening fevers and chills, associated with sob with productive cough and dysuria since 1 to 2 days. She went to see he rPCP yesterday and was given antibiotics for UTI. On arrival to ED, she was foun dto have left pleural effusion with possible pneumonia underneath, fever, and UTI. Pt also reports having nausea, vomiting and diarrhea last night. She is referred to hospitalist for admission for pneumonia and UTI.    Hospital Course:  Questionable Health care associated pneumonia and left pleural effusion:  - admitted to telemetry, started patient on vancomycin and Azactam for penicillin allergy  - blood cultures drawn and negative so far.  - nasal oxygen as needed to keep sats>90%  - repeat cxr in 48 hours does not show any infiltrate. Discontinued the antibiotics.  2. UTI:  - Urine cultures ordered show klebsiella sensitive to ceftin. Discharged on the same.  3. Hypertension;  - controlled.  4. Fever/ leukocytosis/ SIRS:  - possibly from#2.   5. Mild thrombocytopenia:  - mild no evidence of bleeding.     Procedures:  CXR  Consultations: NONE Discharge Exam: Filed Vitals:   07/15/13 0525  BP: 146/58  Pulse: 60  Temp: 98 F (36.7 C)  Resp:  18      Discharge Instructions  Discharge Orders   Future Appointments Provider Department Dept Phone   07/22/2013 3:45 PM Elsie Stain, MD [facility] [phone]   Future Orders Complete By Expires   Diet - low sodium heart healthy  As directed    Discharge instructions  As directed    Comments:     Follow up with PCP in one week.       Medication List    STOP taking these medications       losartan 50 MG tablet  Commonly known as:  COZAAR     nitrofurantoin 100 MG capsule  Commonly known as:  MACRODANTIN      TAKE these medications       AMBULATORY NON FORMULARY MEDICATION  Oxygen at bedtime-2 or 2 1/2 liters     amLODipine 5 MG tablet  Commonly known as:  NORVASC  Take 1 tablet (5 mg total) by mouth daily.     aspirin 325 MG tablet  Take 325 mg by mouth daily.     cefUROXime 250 MG tablet  Commonly known as:  CEFTIN  Take 1 tablet (250 mg total) by mouth 2 (two) times daily with a meal.     diazepam 5 MG tablet  Commonly known as:  VALIUM  Take 1 tablet (5 mg total) by mouth at bedtime.     hydrOXYzine 10 MG tablet  Commonly known as:  ATARAX/VISTARIL  Take 10 mg by mouth every 4 (four) hours as  needed for itching.     nitroGLYCERIN 0.4 MG SL tablet  Commonly known as:  NITROSTAT  Place 0.4 mg under the tongue every 5 (five) minutes as needed for chest pain.     omeprazole 20 MG capsule  Commonly known as:  PRILOSEC  Take 20 mg by mouth daily.     polyethylene glycol packet  Commonly known as:  MIRALAX / GLYCOLAX  Take 17 g by mouth daily as needed (for constipation).       Allergies  Allergen Reactions  . Azithromycin Hives, Diarrhea, Dermatitis and Rash  . Ciprofloxacin Swelling  . Levaquin [Levofloxacin] Hives  . Penicillins Hives    has had mild doses that she did not have reactions to  . Prednisone     Unknown   . Sulfamethoxazole Nausea And Vomiting       Follow-up Information   Follow up with Chancy Hurter, MD. Schedule an appointment as soon as possible for a visit in 1 week.   Specialties:  Internal Medicine, Radiology   Contact information:   [place] [phone]        The results of significant diagnostics from this hospitalization (including imaging, microbiology, ancillary and laboratory) are listed below for reference.    Significant Diagnostic Studies: Dg Chest 2 View  07/15/2013   CLINICAL DATA:  Pneumonia, history of breast cancer  EXAM: CHEST  2 VIEW  COMPARISON:  07/13/2013  FINDINGS: Cardiomegaly again noted. Stable postsurgical changes and volume loss in left lung. Surgical clips in right axilla again noted. No definite superimposed infiltrate or pulmonary edema. Stable mild infrahilar bronchitic changes.  IMPRESSION: Stable postsurgical changes and volume loss in left lung. Surgical clips in right axilla again noted. No definite superimposed infiltrate or pulmonary edema. Stable mild infrahilar bronchitic changes.   Electronically Signed   By: Lahoma Crocker M.D.   On: 07/15/2013 11:47   Ct Chest Wo Contrast  07/08/2013   CLINICAL DATA:  Shortness of breath, weakness, prior abnormal CT chest. History of right breast cancer.  EXAM: CT CHEST WITHOUT CONTRAST  TECHNIQUE: Multidetector CT imaging of the chest was performed following the standard protocol without IV contrast.  COMPARISON:  CT CHEST W/CM dated 03/14/2013; CT ABD-PEL WO/W CM dated 01/04/2013  FINDINGS: No pathologically enlarged mediastinal or axillary lymph nodes. Surgical clips are seen in the right axilla. Hilar regions are difficult to definitively evaluate without IV contrast but appear grossly unremarkable. Heart size within normal limits. Coronary artery calcification. No pericardial effusion.  Minimal biapical pleural parenchymal scarring. Moderate centrilobular emphysema. Mild changes of subpleural radiation fibrosis in the anterior right lung. Mild scarring in the right middle  lobe. Granulomatous calcification in the left upper lobe with associated mild architectural distortion and volume loss. Scarring in the lingula. Interval regression of a subpleural opacity in the lateral aspect of the left lower lobe (image 39). There are a few scattered tiny pulmonary nodular densities measuring 3 mm or less in size. 3 mm right lower lobe nodule (example series 3, image 38) is unchanged from 01/04/2013. Note is made that comparison with 03/14/2013 is difficult due to expiratory phase imaging on that exam. Pleural fluid. Airway is unremarkable.  Incidental imaging of the upper abdomen shows the visualized portions of the liver, adrenal glands, spleen, pancreas, stomach and bowel to be grossly unremarkable. No upper abdominal adenopathy. No worrisome lytic or sclerotic lesions. Degenerative changes are seen in the spine. Pectus deformity.  IMPRESSION: 1. No  acute findings to explain the patient's shortness of breath. 2. Coronary artery calcification. 3. Few scattered tiny pulmonary nodular densities measuring 3 mm or less in size. Comparison with the prior examination is difficult due to expiratory phase imaging on that exam. Given risk factors for bronchogenic carcinoma, follow-up chest CT at 1 year is recommended. This recommendation follows the consensus statement: Guidelines for Management of Small Pulmonary Nodules Detected on CT Scans: A Statement from the [place] as published in Radiology 2005; 237:395-400.   Electronically Signed   By: Lorin Picket M.D.   On: 07/08/2013 14:50   Dg Chest Port 1 View  07/13/2013   CLINICAL DATA:  FEVER COUGH FEVER COUGH  EXAM: PORTABLE CHEST - 1 VIEW  COMPARISON:  CT CHEST W/O CM dated 07/08/2013; DG CHEST 2 VIEW dated 03/30/2013  FINDINGS: Low lung volumes. The cardiac silhouette is enlarged. A atherosclerotic calcifications are identified within the aorta. Small to moderate left pleural effusions appreciated. There is diffuse thickening of the  interstitial markings slightly more prominent when compared to previous study. This finding is accentuated by the low lung volumes. No further focal regions of consolidation appreciated. Remote rib fractures identified within the posterior lateral aspect of the left hemi thorax. The bones are osteopenic.  IMPRESSION: Chronic bronchitic changes these findings accentuated by the patient's low lung volumes. Underlying component of mild pulmonary edema cannot be excluded.  Small to moderate left pleural effusion. A concomitant area of pulmonary infiltrate and/or atelectasis cannot be excluded. Surveillance evaluation with PA and lateral chest radiographs is recommended.   Electronically Signed   By: Margaree Mackintosh M.D.   On: 07/13/2013 15:30    Microbiology: Recent Results (from the past 240 hour(s))  URINE CULTURE     Status: None   Collection Time    07/12/13  9:37 AM      Result Value Ref Range Status   Culture KLEBSIELLA PNEUMONIAE   Final   Colony Count >=100,000 COLONIES/ML   Final   Organism ID, Bacteria KLEBSIELLA PNEUMONIAE   Final  CULTURE, BLOOD (ROUTINE X 2)     Status: None   Collection Time    07/13/13  2:54 PM      Result Value Ref Range Status   Specimen Description BLOOD LEFT ARM   Final   Special Requests BOTTLES DRAWN AEROBIC AND ANAEROBIC 5ML   Final   Culture  Setup Time     Final   Value: 07/13/2013 20:58     Performed at [facility]   Culture     Final   Value:        BLOOD CULTURE RECEIVED NO GROWTH TO DATE CULTURE WILL BE HELD FOR 5 DAYS BEFORE ISSUING A FINAL NEGATIVE REPORT     Performed at [facility]   Report Status PENDING   Incomplete  CULTURE, BLOOD (ROUTINE X 2)     Status: None   Collection Time    07/13/13  2:54 PM      Result Value Ref Range Status   Specimen Description BLOOD LEFT ARM   Final   Special Requests BOTTLES DRAWN AEROBIC AND ANAEROBIC 5ML   Final   Culture  Setup Time     Final   Value: 07/13/2013 20:58     Performed at  [facility]   Culture     Final   Value:        BLOOD CULTURE RECEIVED NO GROWTH TO DATE CULTURE WILL BE HELD FOR  5 DAYS BEFORE ISSUING A FINAL NEGATIVE REPORT     Performed at [facility]   Report Status PENDING   Incomplete  URINE CULTURE     Status: None   Collection Time    07/13/13  3:52 PM      Result Value Ref Range Status   Specimen Description URINE, CLEAN CATCH   Final   Special Requests NONE   Final   Culture  Setup Time     Final   Value: 07/14/2013 03:00     Performed at [facility] Count     Final   Value: >=100,000 COLONIES/ML     Performed at [facility]   Culture     Final   Value: [place]     Performed at [facility]   Report Status PENDING   Incomplete     Labs: Basic Metabolic Panel:  Recent Labs Lab 07/13/13 1454 07/15/13 1000  NA 138 139  K 3.8 4.0  CL 100 104  CO2 25 24  GLUCOSE 128* 90  BUN 11 10  CREATININE 0.83 0.69  CALCIUM 9.1 8.6   Liver Function Tests:  Recent Labs Lab 07/13/13 1531  AST 16  ALT 10  ALKPHOS 64  BILITOT 1.1  PROT 6.7  ALBUMIN 3.6   No results found for this basename: LIPASE, AMYLASE,  in the last 168 hours No results found for this basename: AMMONIA,  in the last 168 hours CBC:  Recent Labs Lab 07/13/13 1454 07/14/13 0948 07/15/13 1000  WBC 20.9* 12.4* 6.3  NEUTROABS 19.1* 10.5* 4.1  HGB 12.8 12.4 12.3  HCT 37.3 37.7 36.5  MCV 85.9 88.1 87.3  PLT 128* 147* 144*   Cardiac Enzymes: No results found for this basename: CKTOTAL, CKMB, CKMBINDEX, TROPONINI,  in the last 168 hours BNP: BNP (last 3 results)  Recent Labs  07/13/13 1454  PROBNP 489.0*   CBG: No results found for this basename: GLUCAP,  in the last 168 hours     Signed:  Daris Aristizabal  [facility] 07/15/2013, 12:31 PM

## 2013-07-16 ENCOUNTER — Inpatient Hospital Stay (HOSPITAL_COMMUNITY)
Admission: EM | Admit: 2013-07-16 | Discharge: 2013-07-18 | DRG: 690 | Disposition: A | Discharge status: Home Health | Payer: Medicare Other | Attending: Internal Medicine | Admitting: Internal Medicine

## 2013-07-16 ENCOUNTER — Encounter (HOSPITAL_COMMUNITY): Payer: Self-pay | Admitting: Emergency Medicine

## 2013-07-16 ENCOUNTER — Emergency Department (HOSPITAL_COMMUNITY): Payer: Medicare Other

## 2013-07-16 DIAGNOSIS — Z79899 Other long term (current) drug therapy: Secondary | ICD-10-CM

## 2013-07-16 DIAGNOSIS — Z853 Personal history of malignant neoplasm of breast: Secondary | ICD-10-CM

## 2013-07-16 DIAGNOSIS — Z880 Allergy status to penicillin: Secondary | ICD-10-CM | POA: Diagnosis not present

## 2013-07-16 DIAGNOSIS — J439 Emphysema, unspecified: Secondary | ICD-10-CM

## 2013-07-16 DIAGNOSIS — A419 Sepsis, unspecified organism: Secondary | ICD-10-CM | POA: Diagnosis present

## 2013-07-16 DIAGNOSIS — Z8249 Family history of ischemic heart disease and other diseases of the circulatory system: Secondary | ICD-10-CM | POA: Diagnosis not present

## 2013-07-16 DIAGNOSIS — Z8049 Family history of malignant neoplasm of other genital organs: Secondary | ICD-10-CM | POA: Diagnosis not present

## 2013-07-16 DIAGNOSIS — I10 Essential (primary) hypertension: Secondary | ICD-10-CM | POA: Diagnosis not present

## 2013-07-16 DIAGNOSIS — N390 Urinary tract infection, site not specified: Secondary | ICD-10-CM | POA: Diagnosis not present

## 2013-07-16 DIAGNOSIS — A310 Pulmonary mycobacterial infection: Secondary | ICD-10-CM | POA: Diagnosis present

## 2013-07-16 DIAGNOSIS — Z9010 Acquired absence of unspecified breast and nipple: Secondary | ICD-10-CM | POA: Diagnosis not present

## 2013-07-16 DIAGNOSIS — Z803 Family history of malignant neoplasm of breast: Secondary | ICD-10-CM

## 2013-07-16 DIAGNOSIS — E872 Acidosis, unspecified: Secondary | ICD-10-CM | POA: Diagnosis not present

## 2013-07-16 DIAGNOSIS — K219 Gastro-esophageal reflux disease without esophagitis: Secondary | ICD-10-CM | POA: Diagnosis present

## 2013-07-16 DIAGNOSIS — R0989 Other specified symptoms and signs involving the circulatory and respiratory systems: Secondary | ICD-10-CM | POA: Diagnosis not present

## 2013-07-16 DIAGNOSIS — Z820 Family history of epilepsy and other diseases of the nervous system: Secondary | ICD-10-CM

## 2013-07-16 DIAGNOSIS — E8729 Other acidosis: Secondary | ICD-10-CM

## 2013-07-16 DIAGNOSIS — Z888 Allergy status to other drugs, medicaments and biological substances status: Secondary | ICD-10-CM | POA: Diagnosis not present

## 2013-07-16 DIAGNOSIS — F411 Generalized anxiety disorder: Secondary | ICD-10-CM | POA: Diagnosis present

## 2013-07-16 DIAGNOSIS — I2510 Atherosclerotic heart disease of native coronary artery without angina pectoris: Secondary | ICD-10-CM | POA: Diagnosis present

## 2013-07-16 DIAGNOSIS — D72829 Elevated white blood cell count, unspecified: Secondary | ICD-10-CM | POA: Diagnosis not present

## 2013-07-16 DIAGNOSIS — J8410 Pulmonary fibrosis, unspecified: Secondary | ICD-10-CM | POA: Diagnosis present

## 2013-07-16 DIAGNOSIS — G4730 Sleep apnea, unspecified: Secondary | ICD-10-CM | POA: Diagnosis present

## 2013-07-16 DIAGNOSIS — J438 Other emphysema: Secondary | ICD-10-CM | POA: Diagnosis present

## 2013-07-16 DIAGNOSIS — Z823 Family history of stroke: Secondary | ICD-10-CM

## 2013-07-16 DIAGNOSIS — K5900 Constipation, unspecified: Secondary | ICD-10-CM | POA: Diagnosis not present

## 2013-07-16 DIAGNOSIS — Z87891 Personal history of nicotine dependence: Secondary | ICD-10-CM | POA: Diagnosis not present

## 2013-07-16 DIAGNOSIS — Z800 Family history of malignant neoplasm of digestive organs: Secondary | ICD-10-CM

## 2013-07-16 LAB — CBC WITH DIFFERENTIAL/PLATELET
BASOS ABS: 0 10*3/uL (ref 0.0–0.1)
Basophils Relative: 0 % (ref 0–1)
EOS ABS: 0 10*3/uL (ref 0.0–0.7)
EOS PCT: 0 % (ref 0–5)
HCT: 39.8 % (ref 36.0–46.0)
Hemoglobin: 14 g/dL (ref 12.0–15.0)
LYMPHS ABS: 0.7 10*3/uL (ref 0.7–4.0)
LYMPHS PCT: 4 % — AB (ref 12–46)
MCH: 29.8 pg (ref 26.0–34.0)
MCHC: 35.2 g/dL (ref 30.0–36.0)
MCV: 84.7 fL (ref 78.0–100.0)
Monocytes Absolute: 0.7 10*3/uL (ref 0.1–1.0)
Monocytes Relative: 4 % (ref 3–12)
NEUTROS PCT: 92 % — AB (ref 43–77)
Neutro Abs: 16.4 10*3/uL — ABNORMAL HIGH (ref 1.7–7.7)
Platelets: 167 10*3/uL (ref 150–400)
RBC: 4.7 MIL/uL (ref 3.87–5.11)
RDW: 13.6 % (ref 11.5–15.5)
WBC: 17.8 10*3/uL — AB (ref 4.0–10.5)

## 2013-07-16 LAB — URINE MICROSCOPIC-ADD ON

## 2013-07-16 LAB — COMPREHENSIVE METABOLIC PANEL
ALK PHOS: 58 U/L (ref 39–117)
ALT: 11 U/L (ref 0–35)
AST: 16 U/L (ref 0–37)
Albumin: 3.6 g/dL (ref 3.5–5.2)
BUN: 10 mg/dL (ref 6–23)
CALCIUM: 9.2 mg/dL (ref 8.4–10.5)
CO2: 23 meq/L (ref 19–32)
Chloride: 97 mEq/L (ref 96–112)
Creatinine, Ser: 0.7 mg/dL (ref 0.50–1.10)
GFR calc Af Amer: 88 mL/min — ABNORMAL LOW (ref >90)
GFR calc non Af Amer: 76 mL/min — ABNORMAL LOW (ref >90)
GLUCOSE: 128 mg/dL — AB (ref 70–99)
POTASSIUM: 3.6 meq/L — AB (ref 3.7–5.3)
Sodium: 137 mEq/L (ref 137–147)
TOTAL PROTEIN: 6.7 g/dL (ref 6.0–8.3)
Total Bilirubin: 1.2 mg/dL (ref 0.3–1.2)

## 2013-07-16 LAB — LACTIC ACID, PLASMA: Lactic Acid, Venous: 1.1 mmol/L (ref 0.5–2.2)

## 2013-07-16 LAB — URINALYSIS, ROUTINE W REFLEX MICROSCOPIC
Glucose, UA: NEGATIVE mg/dL
HGB URINE DIPSTICK: NEGATIVE
Ketones, ur: 40 mg/dL — AB
Nitrite: NEGATIVE
PH: 5.5 (ref 5.0–8.0)
Protein, ur: NEGATIVE mg/dL
SPECIFIC GRAVITY, URINE: 1.015 (ref 1.005–1.030)
Urobilinogen, UA: 1 mg/dL (ref 0.0–1.0)

## 2013-07-16 MED ORDER — DIAZEPAM 5 MG PO TABS
5.0000 mg | ORAL_TABLET | Freq: Every day | ORAL | Status: DC
  Administered 2013-07-16 – 2013-07-17 (×2): 5 mg via ORAL
  Filled 2013-07-16 (×2): qty 1

## 2013-07-16 MED ORDER — ONDANSETRON HCL 4 MG/2ML IJ SOLN: 4.0000 mg | Freq: Four times a day (QID) | INTRAMUSCULAR | Status: DC | PRN

## 2013-07-16 MED ORDER — PANTOPRAZOLE SODIUM 40 MG PO TBEC
40.0000 mg | DELAYED_RELEASE_TABLET | Freq: Every day | ORAL | Status: DC
  Administered 2013-07-16 – 2013-07-18 (×3): 40 mg via ORAL
  Filled 2013-07-16 (×3): qty 1

## 2013-07-16 MED ORDER — POLYETHYLENE GLYCOL 3350 17 G PO PACK
17.0000 g | PACK | Freq: Every day | ORAL | Status: DC | PRN
  Filled 2013-07-16: qty 1

## 2013-07-16 MED ORDER — DEXTROSE 5 % IV SOLN
1.0000 g | Freq: Two times a day (BID) | INTRAVENOUS | Status: DC
  Administered 2013-07-16 – 2013-07-18 (×5): 1 g via INTRAVENOUS
  Filled 2013-07-16 (×6): qty 1

## 2013-07-16 MED ORDER — ASPIRIN 325 MG PO TABS
325.0000 mg | ORAL_TABLET | Freq: Every day | ORAL | Status: DC
  Administered 2013-07-16 – 2013-07-18 (×3): 325 mg via ORAL
  Filled 2013-07-16 (×3): qty 1

## 2013-07-16 MED ORDER — SODIUM CHLORIDE 0.9 % IV BOLUS (SEPSIS)
1000.0000 mL | Freq: Once | INTRAVENOUS | Status: AC
  Administered 2013-07-16: 1000 mL via INTRAVENOUS

## 2013-07-16 MED ORDER — ONDANSETRON HCL 4 MG PO TABS: 4.0000 mg | ORAL_TABLET | Freq: Four times a day (QID) | ORAL | Status: DC | PRN

## 2013-07-16 MED ORDER — ACETAMINOPHEN 650 MG RE SUPP
650.0000 mg | Freq: Four times a day (QID) | RECTAL | Status: DC | PRN
Start: 2013-07-16 — End: 2013-07-18

## 2013-07-16 MED ORDER — SODIUM CHLORIDE 0.9 % IV SOLN
INTRAVENOUS | Status: DC
Start: 2013-07-16 — End: 2013-07-18
  Administered 2013-07-16 – 2013-07-17 (×2): via INTRAVENOUS
  Administered 2013-07-18: 75 mL via INTRAVENOUS

## 2013-07-16 MED ORDER — AMLODIPINE BESYLATE 5 MG PO TABS
5.0000 mg | ORAL_TABLET | Freq: Every day | ORAL | Status: DC
  Administered 2013-07-17 – 2013-07-18 (×2): 5 mg via ORAL
  Filled 2013-07-16 (×2): qty 1

## 2013-07-16 MED ORDER — HYDROXYZINE HCL 10 MG PO TABS
10.0000 mg | ORAL_TABLET | ORAL | Status: DC | PRN
  Filled 2013-07-16: qty 1

## 2013-07-16 MED ORDER — HYDROCODONE-ACETAMINOPHEN 5-325 MG PO TABS
1.0000 | ORAL_TABLET | ORAL | Status: DC | PRN
  Administered 2013-07-18: 2 via ORAL
  Filled 2013-07-16: qty 2

## 2013-07-16 MED ORDER — ACETAMINOPHEN 325 MG PO TABS: 650.0000 mg | ORAL_TABLET | Freq: Four times a day (QID) | ORAL | Status: DC | PRN

## 2013-07-16 MED ORDER — MORPHINE SULFATE 2 MG/ML IJ SOLN: 1.0000 mg | INTRAMUSCULAR | Status: DC | PRN

## 2013-07-16 NOTE — ED Notes (Signed)
Attempted to call report floor RN to call back

## 2013-07-16 NOTE — Progress Notes (Signed)
Urine culture collected as ordered.

## 2013-07-16 NOTE — H&P (Signed)
[facility] History and Physical  Merelyn Degross [id number] DOB: March 29, 1924 DOA: 07/16/2013  Referring physician: ER physician PCP: Chancy Hurter, MD   Chief Complaint: "did not feel good", fever  HPI:  80 year old female with past medical history of hypertension, just recent admission and hospitalization for pneumonia (HCAP) and UTI (discharged 07/15/2013) who now presented to [facility] ED 07/16/2013 with reports of fever at home, chills and had multiple episodes of vomiting last night. She was discharge on Ceftin for continuation of pneumonia treatment but said she did not feel good on the night of the discharge and has decided to return for evaluation. She reports chronic cough and productive sputum as well as shortness of breath. No other complaints of abdominal pain. No blood in stool or urine. No falls, no lightheadedness or weakness. In ED, BP was 117/72, HR 68, Tmax 98.54F and oxygen saturation 95% on room air. Her UA showed mild leukocytes. No acute findings on CXR. Lactic acid was WNL.  Assessment and Plan:  Principal Problem:   UTI (urinary tract infection) - urinalysis with trace leukocytes so will treat with cefepime based on previous recent urine culture (that grew Klebsiella); follow up urine culture results - may continue IV fluids for next 24 horus Active Problems:   HYPERTENSION - continue norvasc   COPD with emphysema, gold stage A - symptoms of cough and shortness of breaath are likely from COPD - CXR did not reveal pneumonia   GERD - continue protonix   Leukocytosis, unspecified - secondary to UTI  Radiological Exams on Admission: Dg Chest 2 View 07/16/2013   IMPRESSION: Chronic changes.  No active disease.   Electronically Signed   By: Rolm Baptise M.D.   On: 07/16/2013 10:32   Dg Chest 2 View 07/15/2013     IMPRESSION: Stable postsurgical changes and volume loss in left lung. Surgical clips in right axilla again noted. No definite superimposed infiltrate or  pulmonary edema. Stable mild infrahilar bronchitic changes.   Electronically Signed   By: Lahoma Crocker M.D.   On: 07/15/2013 11:47     Code Status: Full Family Communication: Pt at bedside Disposition Plan: Admit for further evaluation  Leisa Lenz, MD  [facility] Pager [phone]  Review of Systems:  Constitutional: Negative for fever, chills and malaise/fatigue. Negative for diaphoresis.  HENT: Negative for hearing loss, ear pain, nosebleeds, congestion, sore throat, neck pain, tinnitus and ear discharge.   Eyes: Negative for blurred vision, double vision, photophobia, pain, discharge and redness.  Respiratory: per HPI.   Cardiovascular: Negative for chest pain, palpitations, orthopnea, claudication and leg swelling.  Gastrointestinal: positive for nausea, vomiting but no abdominal pain. Negative for heartburn, constipation, blood in stool and melena.  Genitourinary: Negative for dysuria, urgency, frequency, hematuria and flank pain.  Musculoskeletal: Negative for myalgias, back pain, joint pain and falls.  Skin: Negative for itching and rash.  Neurological: Negative for dizziness and weakness. Negative for tingling, tremors, sensory change, speech change, focal weakness, loss of consciousness and headaches.  Endo/Heme/Allergies: Negative for environmental allergies and polydipsia. Does not bruise/bleed easily.  Psychiatric/Behavioral: Negative for suicidal ideas. The patient is not nervous/anxious.      Past Medical History  Diagnosis Date  . ANXIETY 08/16/2008  . CAD, UNSPECIFIED SITE 10/17/2008  . CHOLELITHIASIS 08/23/2008  . COPD 01/04/2010    FeV1 101%, DLCO64% 2008  . 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 DIVERTICULOSIS-COLON 04/07/2008  . GERD 04/19/2007  . HYPERSOMNIA, ASSOCIATED WITH SLEEP APNEA 02/01/2007  .  HYPERTENSION 11/12/2006  . MITRAL VALVE PROLAPSE, HX OF 10/07/2008  . NEOPLASM, MALIGNANT, BREAST, RIGHT 02/15/2008  . OSTEOPOROSIS 04/04/2008  .  Raynaud's syndrome 04/25/2009  . Airway obstruction   . History of diverticulitis of colon   . Tortuous colon   . Anal stricture   . Chronic constipation   . Fatty liver   . Hiatal hernia   . Esophageal stricture   . IBS (irritable bowel syndrome)   . Pulmonary fibrosis   . Sleep apnea   . COPD (chronic obstructive pulmonary disease)   . Colitis     sigmoid  . Candida esophagitis   . Diverticulitis   . Hearing aid worn     bilateral  . Atrial fibrillation     she denies known hx of afib 02/24/13  . Complication of anesthesia     difficult intubation   Past Surgical History  Procedure Laterality Date  . Abdominal hysterectomy    . Bilateral salpingoophorectomy      s/p prolapsed bladder  . Bladder surgery      prolapsed  . Thoracotomy      granuloma, pulmonary-thoracotomy  . Breast surgery  07-21-07    mastectomy (right), all margins neg and LNs neg   . Appendectomy    . Tonsillectomy    . Rotator cuff repair    . Mastectomy      right  . Breast cyst excision      left  . Mouth surgery    . Eye surgery      cataract removal bilateral  . Melanoma excision      rt clavicle Dr Rhoderick Moody office)  . Lung surgery     Social History:  reports that she quit smoking about 30 years ago. Her smoking use included Cigarettes. She has a 60 pack-year smoking history. She has never used smokeless tobacco. She reports that she drinks alcohol. She reports that she does not use illicit drugs.  Allergies  Allergen Reactions  . Azithromycin Hives, Diarrhea, Dermatitis and Rash  . Ciprofloxacin Swelling  . Levaquin [Levofloxacin] Hives  . Penicillins Hives    has had mild doses that she did not have reactions to  . Prednisone     Unknown   . Sulfamethoxazole Nausea And Vomiting    Family History:  Family History  Problem Relation Age of Onset  . Parkinsonism Father   . Coronary artery disease Father   . Breast cancer      aunt  . Colon cancer      aunt, age 13  .  Uterine cancer      aunt  . Stroke Mother   . Heart failure Mother   . Cancer Maternal Aunt     breast     Prior to Admission medications   Medication Sig Start Date End Date Taking? Authorizing Provider  acetaminophen (TYLENOL) 325 MG tablet Take 650 mg by mouth every 6 (six) hours as needed for mild pain.   Yes Historical Provider, MD  AMBULATORY NON FORMULARY MEDICATION Oxygen at bedtime-2 or 2 1/2 liters   Yes Historical Provider, MD  amLODipine (NORVASC) 5 MG tablet Take 1 tablet (5 mg total) by mouth daily. 07/12/13  Yes Lisabeth Pick, MD  cefUROXime (CEFTIN) 250 MG tablet Take 1 tablet (250 mg total) by mouth 2 (two) times daily with a meal. 07/15/13  Yes Hosie Poisson, MD  diazepam (VALIUM) 5 MG tablet Take 1 tablet (5 mg total) by mouth at bedtime. 07/12/13  Yes Lisabeth Pick, MD  hydrOXYzine (ATARAX/VISTARIL) 10 MG tablet Take 10 mg by mouth every 4 (four) hours as needed for itching.   Yes Historical Provider, MD  nitrofurantoin, macrocrystal-monohydrate, (MACROBID) 100 MG capsule Take 1 capsule by mouth 2 (two) times daily. For 5 days 07/12/13  Yes Historical Provider, MD  omeprazole (PRILOSEC) 20 MG capsule Take 20 mg by mouth daily.   Yes Historical Provider, MD  polyethylene glycol (MIRALAX / GLYCOLAX) packet Take 17 g by mouth daily as needed (for constipation).   Yes Historical Provider, MD  aspirin 325 MG tablet Take 325 mg by mouth daily.    Historical Provider, MD  nitroGLYCERIN (NITROSTAT) 0.4 MG SL tablet Place 0.4 mg under the tongue every 5 (five) minutes as needed for chest pain.    Historical Provider, MD   Physical Exam: Filed Vitals:   07/16/13 1131 07/16/13 1349 07/16/13 1616 07/16/13 1718  BP: 117/72 139/77 118/99 130/55  Pulse: 83 80 74 68  Temp:  98.4 F (36.9 C) 98.9 F (37.2 C) 98.3 F (36.8 C)  TempSrc:  Oral Oral Oral  Resp: 18 16 16 20   SpO2: 96% 95% 97% 98%    Physical Exam  Constitutional: Appears well-developed and well-nourished. No distress.   HENT: Normocephalic. External right and left ear normal.dry mucus membranes Eyes: Conjunctivae and EOM are normal. PERRLA, no scleral icterus.  Neck: Normal ROM. Neck supple. No JVD. No tracheal deviation. No thyromegaly.  CVS: RRR, S1/S2 appreciated  Pulmonary: Effort and breath sounds normal, no stridor, rhonchi, wheezes, rales.  Abdominal: Soft. BS +,  no distension, tenderness, rebound or guarding.  Musculoskeletal: Normal range of motion. No edema and no tenderness.  Lymphadenopathy: No lymphadenopathy noted, cervical, inguinal. Neuro: Alert. No focal neurologic deficits  Skin: Skin is warm and dry. Psychiatric: Normal mood and affect. Behavior, judgment, thought content normal.   Labs on Admission:  Basic Metabolic Panel:  Recent Labs Lab 07/13/13 1454 07/15/13 1000 07/16/13 1005  NA 138 139 137  K 3.8 4.0 3.6*  CL 100 104 97  CO2 25 24 23   GLUCOSE 128* 90 128*  BUN 11 10 10   CREATININE 0.83 0.69 0.70  CALCIUM 9.1 8.6 9.2   Liver Function Tests:  Recent Labs Lab 07/13/13 1531 07/16/13 1005  AST 16 16  ALT 10 11  ALKPHOS 64 58  BILITOT 1.1 1.2  PROT 6.7 6.7  ALBUMIN 3.6 3.6   No results found for this basename: LIPASE, AMYLASE,  in the last 168 hours No results found for this basename: AMMONIA,  in the last 168 hours CBC:  Recent Labs Lab 07/13/13 1454 07/14/13 0948 07/15/13 1000 07/16/13 1005  WBC 20.9* 12.4* 6.3 17.8*  NEUTROABS 19.1* 10.5* 4.1 16.4*  HGB 12.8 12.4 12.3 14.0  HCT 37.3 37.7 36.5 39.8  MCV 85.9 88.1 87.3 84.7  PLT 128* 147* 144* 167   Cardiac Enzymes: No results found for this basename: CKTOTAL, CKMB, CKMBINDEX, TROPONINI,  in the last 168 hours BNP: No components found with this basename: POCBNP,  CBG: No results found for this basename: GLUCAP,  in the last 168 hours  If 7PM-7AM, please contact night-coverage [email or web address] Password [facility] 07/16/2013, 6:22 PM

## 2013-07-16 NOTE — ED Notes (Signed)
Md at bedside

## 2013-07-16 NOTE — ED Provider Notes (Signed)
CSN: [id number]     Arrival date & time 07/16/13  9798 History   First MD Initiated Contact with Patient 07/16/13 [phone]     Chief Complaint  Patient presents with  . Fever  . Emesis     (Consider location/radiation/quality/duration/timing/severity/associated sxs/prior Treatment) Patient is a 80 y.o. female presenting with fever and vomiting. The history is provided by the patient and a relative. No language interpreter was used.  Fever Associated symptoms: chest pain, chills, cough, dysuria, nausea, rash, sore throat and vomiting   Associated symptoms: no diarrhea and no headaches   Emesis Associated symptoms: chills and sore throat   Associated symptoms: no abdominal pain, no diarrhea and no headaches    This is an 80yo WF w/ PMH COPD, cholelithiasis, anxiety, CAD, diverticulosis, IBS, HTN, GERD who presents to ED w/ complaint of N/V x 2am this morning. Patient was admitted to the hospital 3/4-07/15/2013 for treatment of sepsis 2/2 HCAP and UTI (klebsilla per UCx, BCx x 2 NGTD). When she got home from the hospital yesterday she felt well and ate some dinner. She took one dose of ceftin, which had been given to her at discharge. She went to bed around 9pm and woke up with rigors at 11pm. At 2am she developed N/V, emesis nonbloody and yellow in color. She has since vomited 10-11 times. No abd pain, diarrhea, bloody stool, hematuria. She does have continued dysuria. She took her temperature at home this morning at 5am, Temp 101. She has chronic SOB 2/2 COPD and wears oxygen at night, but notes her SOB w/ exertion was much worse this AM. She notes her cough is worsening and productive of thick, white colored sputum. Last echo done 07/14/13 showed preserved EF w/ grade 1 diastolic dysfunction. She has also had a red, pruritic rash since November 2014 after having an allergic reaction to azithromycin.   Past Medical History  Diagnosis Date  . ANXIETY 08/16/2008  . CAD, UNSPECIFIED SITE 10/17/2008  .  CHOLELITHIASIS 08/23/2008  . COPD 01/04/2010    FeV1 101%, DLCO64% 2008  . 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 DIVERTICULOSIS-COLON 04/07/2008  . GERD 04/19/2007  . HYPERSOMNIA, ASSOCIATED WITH SLEEP APNEA 02/01/2007  . HYPERTENSION 11/12/2006  . MITRAL VALVE PROLAPSE, HX OF 10/07/2008  . NEOPLASM, MALIGNANT, BREAST, RIGHT 02/15/2008  . OSTEOPOROSIS 04/04/2008  . Raynaud's syndrome 04/25/2009  . Airway obstruction   . History of diverticulitis of colon   . Tortuous colon   . Anal stricture   . Chronic constipation   . Fatty liver   . Hiatal hernia   . Esophageal stricture   . IBS (irritable bowel syndrome)   . Pulmonary fibrosis   . Sleep apnea   . COPD (chronic obstructive pulmonary disease)   . Colitis     sigmoid  . Candida esophagitis   . Diverticulitis   . Hearing aid worn     bilateral  . Atrial fibrillation     she denies known hx of afib 02/24/13  . Complication of anesthesia     difficult intubation   Past Surgical History  Procedure Laterality Date  . Abdominal hysterectomy    . Bilateral salpingoophorectomy      s/p prolapsed bladder  . Bladder surgery      prolapsed  . Thoracotomy      granuloma, pulmonary-thoracotomy  . Breast surgery  07-21-07    mastectomy (right), all margins neg and LNs neg   . Appendectomy    . Tonsillectomy    .  Rotator cuff repair    . Mastectomy      right  . Breast cyst excision      left  . Mouth surgery    . Eye surgery      cataract removal bilateral  . Melanoma excision      rt clavicle Dr Rhoderick [facility] office)  . Lung surgery     Family History  Problem Relation Age of Onset  . Parkinsonism Father   . Coronary artery disease Father   . Breast cancer      aunt  . Colon cancer      aunt, age 98  . Uterine cancer      aunt  . Stroke Mother   . Heart failure Mother   . Cancer Maternal Aunt     breast   History  Substance Use Topics  . Smoking status: Former Smoker -- 2.00 packs/day for 30 years     Types: Cigarettes    Quit date: 05/13/1983  . Smokeless tobacco: Never Used  . Alcohol Use: Yes     Comment: 1-2 glaases wine per day   OB History   Grav Para Term Preterm Abortions TAB SAB Ect Mult Living                 Review of Systems  Constitutional: Positive for fever, chills and fatigue.  HENT: Positive for sore throat.   Respiratory: Positive for cough and shortness of breath.   Cardiovascular: Positive for chest pain.       Pleuritic L chest pain for months  Gastrointestinal: Positive for nausea and vomiting. Negative for abdominal pain, diarrhea, constipation and blood in stool.  Genitourinary: Positive for dysuria and frequency. Negative for hematuria and flank pain.  Musculoskeletal: Negative for back pain.  Skin: Positive for rash.  Neurological: Negative for syncope and headaches.  All other systems reviewed and are negative.      Allergies  Azithromycin; Ciprofloxacin; Levaquin; Penicillins; Prednisone; and Sulfamethoxazole  Home Medications   Current Outpatient Rx  Name  Route  Sig  Dispense  Refill  . AMBULATORY NON FORMULARY MEDICATION      Oxygen at bedtime-2 or 2 1/2 liters         . amLODipine (NORVASC) 5 MG tablet   Oral   Take 1 tablet (5 mg total) by mouth daily.   90 tablet   3   . aspirin 325 MG tablet   Oral   Take 325 mg by mouth daily.         . cefUROXime (CEFTIN) 250 MG tablet   Oral   Take 1 tablet (250 mg total) by mouth 2 (two) times daily with a meal.   10 tablet   0     Patient has used keflex before without any reactio ...   . diazepam (VALIUM) 5 MG tablet   Oral   Take 1 tablet (5 mg total) by mouth at bedtime.   30 tablet   3   . hydrOXYzine (ATARAX/VISTARIL) 10 MG tablet   Oral   Take 10 mg by mouth every 4 (four) hours as needed for itching.         . nitroGLYCERIN (NITROSTAT) 0.4 MG SL tablet   Sublingual   Place 0.4 mg under the tongue every 5 (five) minutes as needed for chest pain.         Marland Kitchen  omeprazole (PRILOSEC) 20 MG capsule   Oral   Take 20 mg by mouth daily.         Marland Kitchen  polyethylene glycol (MIRALAX / GLYCOLAX) packet   Oral   Take 17 g by mouth daily as needed (for constipation).          BP 118/65  Pulse 91  Temp(Src) 98.3 F (36.8 C) (Oral)  Resp 18  SpO2 96% Physical Exam  Constitutional: She is oriented to person, place, and time. She appears well-developed and well-nourished. No distress.  HENT:  Head: Normocephalic and atraumatic.  Dry mucous membranes  Eyes: Conjunctivae are normal. Pupils are equal, round, and reactive to light.  Neck: Neck supple.  Cardiovascular: Normal rate and regular rhythm.  Exam reveals no gallop and no friction rub.   No murmur heard. Pulmonary/Chest: Effort normal and breath sounds normal.  Slightly diminished breath sounds on L base, though otherwise CTAB  Abdominal: Soft. Bowel sounds are normal. She exhibits no distension. There is tenderness.  Mildly TTP RLQ/LLQ  Musculoskeletal: She exhibits no edema.  Neurological: She is alert and oriented to person, place, and time. No cranial nerve deficit. Coordination normal.  Skin: Skin is warm and dry. Rash noted.  Scattered erythematous macules and papules to b/l forearms    ED Course  Procedures (including critical care time) Labs Review Labs Reviewed  CULTURE, BLOOD (ROUTINE X 2)  CULTURE, BLOOD (ROUTINE X 2)  CBC WITH DIFFERENTIAL  COMPREHENSIVE METABOLIC PANEL  URINALYSIS, ROUTINE W REFLEX MICROSCOPIC  LACTIC ACID, PLASMA   Imaging Review Dg Chest 2 View  07/15/2013   CLINICAL DATA:  Pneumonia, history of breast cancer  EXAM: CHEST  2 VIEW  COMPARISON:  07/13/2013  FINDINGS: Cardiomegaly again noted. Stable postsurgical changes and volume loss in left lung. Surgical clips in right axilla again noted. No definite superimposed infiltrate or pulmonary edema. Stable mild infrahilar bronchitic changes.  IMPRESSION: Stable postsurgical changes and volume loss in left lung.  Surgical clips in right axilla again noted. No definite superimposed infiltrate or pulmonary edema. Stable mild infrahilar bronchitic changes.   Electronically Signed   By: Lahoma Crocker M.D.   On: 07/15/2013 11:47     EKG Interpretation None      MDM   This is a 80yo WF w/ multiple medical problems who was just recently discharged from the hospital after treatment for sepsis 2/2 possible HCAP and UTI (klebsiella pneumoniae) who presents today w/ fever to 101 at home, rigors, and N/V x 10 episodes. Though VSS in ED currently, I am concerned for continued sepsis. Will obtain UA, CMP, CBC w/ diff, CXR, and repeat BCx x2. Will given 1L NS bolus now.   11:14 AM Pt with leukocytosis 17 (WBC had been 6.3 on discharge yesterday) w/ left shift; AG 17 (AG 11 yesterday). CXR unrevealing. Lactic acid still pending, though I believe patient has sepsis 2/2 UTI. Will consult hospitalist service for admission.  12:00 PM Spoke with Dr. Charlies Silvers w/ internal medicine. She will admit patient.  Rebecca Eaton, MD 07/16/13 [phone]

## 2013-07-16 NOTE — ED Notes (Signed)
She remains in no distress.  Per her request, I have placed her on her usual 2.5 l.p.m. Home o2, which she normally uses at night.  Seh has been comfortably napping in E.D.  I have just phoned report to Vanita Ingles, RN on [place] and we will transport now.

## 2013-07-16 NOTE — ED Notes (Signed)
Attempted to call report to floor 

## 2013-07-16 NOTE — ED Notes (Signed)
She tells [place] that she was released from hospital yesterday for u.t.i. She states she ran a fever last night and "still just don't feel good".

## 2013-07-16 NOTE — ED Notes (Signed)
Pt states that she was admitted on Wednesday and was released yesterday for a UTI and pna and last night started vomiting and having fever and chills about 2300.  Fever of 101.  Denies diarrhea.  Also unable to maintain urinary continence.

## 2013-07-16 NOTE — Progress Notes (Signed)
Assumed care of patient. Pt alert and oriented x 4. In no distress of any sort at this time. received in rm 1502. Pt reoriented to room and use of call bell. Vwilliams,rn.

## 2013-07-16 NOTE — ED Provider Notes (Signed)
I saw and evaluated the patient, reviewed the resident's note and I agree with the findings and plan.   .Face to face Exam:  General:  Awake HEENT:  Atraumatic Resp:  Normal effort Abd:  Nondistended Neuro:No focal weakness  Dot Lanes, MD 07/16/13 1229

## 2013-07-17 ENCOUNTER — Encounter (HOSPITAL_COMMUNITY): Payer: Self-pay

## 2013-07-17 DIAGNOSIS — J438 Other emphysema: Secondary | ICD-10-CM | POA: Diagnosis not present

## 2013-07-17 DIAGNOSIS — N390 Urinary tract infection, site not specified: Secondary | ICD-10-CM | POA: Diagnosis not present

## 2013-07-17 DIAGNOSIS — Z853 Personal history of malignant neoplasm of breast: Secondary | ICD-10-CM | POA: Diagnosis not present

## 2013-07-17 DIAGNOSIS — K5900 Constipation, unspecified: Secondary | ICD-10-CM

## 2013-07-17 DIAGNOSIS — D72829 Elevated white blood cell count, unspecified: Secondary | ICD-10-CM | POA: Diagnosis not present

## 2013-07-17 LAB — GLUCOSE, CAPILLARY: GLUCOSE-CAPILLARY: 109 mg/dL — AB (ref 70–99)

## 2013-07-17 LAB — COMPREHENSIVE METABOLIC PANEL
ALBUMIN: 2.7 g/dL — AB (ref 3.5–5.2)
ALK PHOS: 53 U/L (ref 39–117)
ALT: 8 U/L (ref 0–35)
AST: 10 U/L (ref 0–37)
BILIRUBIN TOTAL: 0.3 mg/dL (ref 0.3–1.2)
BUN: 8 mg/dL (ref 6–23)
CO2: 25 mEq/L (ref 19–32)
Calcium: 8.4 mg/dL (ref 8.4–10.5)
Chloride: 105 mEq/L (ref 96–112)
Creatinine, Ser: 0.62 mg/dL (ref 0.50–1.10)
GFR calc Af Amer: >90 mL/min (ref >90)
GFR calc non Af Amer: 79 mL/min — ABNORMAL LOW (ref >90)
Glucose, Bld: 118 mg/dL — ABNORMAL HIGH (ref 70–99)
POTASSIUM: 3.4 meq/L — AB (ref 3.7–5.3)
SODIUM: 140 meq/L (ref 137–147)
TOTAL PROTEIN: 5.8 g/dL — AB (ref 6.0–8.3)

## 2013-07-17 LAB — CBC
HEMATOCRIT: 34 % — AB (ref 36.0–46.0)
Hemoglobin: 11.6 g/dL — ABNORMAL LOW (ref 12.0–15.0)
MCH: 29.1 pg (ref 26.0–34.0)
MCHC: 34.1 g/dL (ref 30.0–36.0)
MCV: 85.2 fL (ref 78.0–100.0)
PLATELETS: 179 10*3/uL (ref 150–400)
RBC: 3.99 MIL/uL (ref 3.87–5.11)
RDW: 13.9 % (ref 11.5–15.5)
WBC: 9.6 10*3/uL (ref 4.0–10.5)

## 2013-07-17 LAB — TSH: TSH: 1.074 u[IU]/mL (ref 0.350–4.500)

## 2013-07-17 MED ORDER — BISACODYL 10 MG RE SUPP
10.0000 mg | Freq: Once | RECTAL | Status: AC
  Administered 2013-07-17: 10 mg via RECTAL
  Filled 2013-07-17: qty 1

## 2013-07-17 MED ORDER — SORBITOL 70 % SOLN
30.0000 mL | Status: AC
  Administered 2013-07-17: 30 mL via ORAL
  Filled 2013-07-17: qty 30

## 2013-07-17 NOTE — Progress Notes (Addendum)
[facility] PROGRESS NOTE  [name] [name] [id number] DOB: 04-17-1924 DOA: 07/16/2013 PCP: Chancy Hurter, MD  Assessment/Plan: UTI (urinary tract infection)  - urinalysis with trace leukocytes so will treat with cefepime based on previous recent urine culture (that grew Klebsiella); follow up urine culture results  - may continue IV fluids for next 24 horus  HYPERTENSION  - continue norvasc  COPD with emphysema, gold stage A  - symptoms of cough and shortness of breaath are likely from COPD  - CXR did not reveal pneumonia  GERD  - continue protonix  Leukocytosis, unspecified  - secondary to UTI Constipation - Will give a trial of PO sorbitol with dulcolax suppository - Per pt's daughter, the patient has a long hx of constipation and had not been taking scheduled miralax as was recommended for her  Code Status: Full Family Communication: Pt and daughter in room (indicate person spoken with, relationship, and if by phone, the number) Disposition Plan: Pending   Consultants:    Procedures:    Antibiotics: Cefepime 07/16/13>>>  HPI/Subjective: No acute events. Pt reports feeling constipated. Reports early satiety and increased flatus and bloating.  Objective: Filed Vitals:   07/16/13 1718 07/16/13 2100 07/16/13 2148 07/17/13 0539  BP: 130/55  124/54 130/64  Pulse: 68  71 59  Temp: 98.3 F (36.8 C)  98.2 F (36.8 C) 97.3 F (36.3 C)  TempSrc: Oral  Oral Oral  Resp: 20  18 18   Height:  5\' 1"  (1.549 m)    Weight:  53.98 kg (119 lb 0.1 oz)  54.613 kg (120 lb 6.4 oz)  SpO2: 98%  98% 98%    Intake/Output Summary (Last 24 hours) at 07/17/13 1339 Last data filed at 07/17/13 1000  Gross per 24 hour  Intake   1090 ml  Output    500 ml  Net    590 ml   Filed Weights   07/16/13 2100 07/17/13 0539  Weight: 53.98 kg (119 lb 0.1 oz) 54.613 kg (120 lb 6.4 oz)    Exam:   General:  Awake, in nad  Cardiovascular: regular, s1, s2  Respiratory: normal resp  effort, no wheezing  Abdomen: soft, nondistended  Musculoskeletal: perfused, no clubbing   Data Reviewed: Basic Metabolic Panel:  Recent Labs Lab 07/13/13 1454 07/15/13 1000 07/16/13 1005 07/17/13 0537  NA 138 139 137 140  K 3.8 4.0 3.6* 3.4*  CL 100 104 97 105  CO2 25 24 23 25   GLUCOSE 128* 90 128* 118*  BUN 11 10 10 8   CREATININE 0.83 0.69 0.70 0.62  CALCIUM 9.1 8.6 9.2 8.4   Liver Function Tests:  Recent Labs Lab 07/13/13 1531 07/16/13 1005 07/17/13 0537  AST 16 16 10   ALT 10 11 8   ALKPHOS 64 58 53  BILITOT 1.1 1.2 0.3  PROT 6.7 6.7 5.8*  ALBUMIN 3.6 3.6 2.7*   No results found for this basename: LIPASE, AMYLASE,  in the last 168 hours No results found for this basename: AMMONIA,  in the last 168 hours CBC:  Recent Labs Lab 07/13/13 1454 07/14/13 0948 07/15/13 1000 07/16/13 1005 07/17/13 0537  WBC 20.9* 12.4* 6.3 17.8* 9.6  NEUTROABS 19.1* 10.5* 4.1 16.4*  --   HGB 12.8 12.4 12.3 14.0 11.6*  HCT 37.3 37.7 36.5 39.8 34.0*  MCV 85.9 88.1 87.3 84.7 85.2  PLT 128* 147* 144* 167 179   Cardiac Enzymes: No results found for this basename: CKTOTAL, CKMB, CKMBINDEX, TROPONINI,  in the last 168 hours BNP (last  3 results)  Recent Labs  07/13/13 1454  PROBNP 489.0*   CBG:  Recent Labs Lab 07/17/13 0728  GLUCAP 109*    Recent Results (from the past 240 hour(s))  URINE CULTURE     Status: None   Collection Time    07/12/13  9:37 AM      Result Value Ref Range Status   Culture KLEBSIELLA PNEUMONIAE   Final   Colony Count >=100,000 COLONIES/ML   Final   Organism ID, Bacteria KLEBSIELLA PNEUMONIAE   Final  CULTURE, BLOOD (ROUTINE X 2)     Status: None   Collection Time    07/13/13  2:54 PM      Result Value Ref Range Status   Specimen Description BLOOD LEFT ARM   Final   Special Requests BOTTLES DRAWN AEROBIC AND ANAEROBIC 5ML   Final   Culture  Setup Time     Final   Value: 07/13/2013 20:58     Performed at [facility]   Culture      Final   Value:        BLOOD CULTURE RECEIVED NO GROWTH TO DATE CULTURE WILL BE HELD FOR 5 DAYS BEFORE ISSUING A FINAL NEGATIVE REPORT     Performed at [facility]   Report Status PENDING   Incomplete  CULTURE, BLOOD (ROUTINE X 2)     Status: None   Collection Time    07/13/13  2:54 PM      Result Value Ref Range Status   Specimen Description BLOOD LEFT ARM   Final   Special Requests BOTTLES DRAWN AEROBIC AND ANAEROBIC 5ML   Final   Culture  Setup Time     Final   Value: 07/13/2013 20:58     Performed at [facility]   Culture     Final   Value:        BLOOD CULTURE RECEIVED NO GROWTH TO DATE CULTURE WILL BE HELD FOR 5 DAYS BEFORE ISSUING A FINAL NEGATIVE REPORT     Performed at [facility]   Report Status PENDING   Incomplete  URINE CULTURE     Status: None   Collection Time    07/13/13  3:52 PM      Result Value Ref Range Status   Specimen Description URINE, CLEAN CATCH   Final   Special Requests NONE   Final   Culture  Setup Time     Final   Value: 07/14/2013 03:00     Performed at [facility]     Final   Value: >=100,000 COLONIES/ML     Performed at [facility]   Culture     Final   Value: KLEBSIELLA PNEUMONIAE     Performed at [facility]   Report Status 07/15/2013 FINAL   Final   Organism ID, Bacteria KLEBSIELLA PNEUMONIAE   Final  CULTURE, BLOOD (ROUTINE X 2)     Status: None   Collection Time    07/16/13 11:57 AM      Result Value Ref Range Status   Specimen Description BLOOD LEFT ARM   Final   Special Requests BOTTLES DRAWN AEROBIC AND ANAEROBIC [facility] EACH   Final   Culture  Setup Time     Final   Value: 07/16/2013 19:21     Performed at [facility]   Culture     Final   Value:        BLOOD  CULTURE RECEIVED NO GROWTH TO DATE CULTURE WILL BE HELD FOR 5 DAYS BEFORE ISSUING A FINAL NEGATIVE REPORT     Performed at [facility]   Report Status PENDING   Incomplete  CULTURE, BLOOD  (ROUTINE X 2)     Status: None   Collection Time    07/16/13 12:02 PM      Result Value Ref Range Status   Specimen Description BLOOD LEFT WRIST   Final   Special Requests BOTTLES DRAWN AEROBIC AND ANAEROBIC [facility] EACH   Final   Culture  Setup Time     Final   Value: 07/16/2013 19:21     Performed at [facility]   Culture     Final   Value:        BLOOD CULTURE RECEIVED NO GROWTH TO DATE CULTURE WILL BE HELD FOR 5 DAYS BEFORE ISSUING A FINAL NEGATIVE REPORT     Performed at [facility]   Report Status PENDING   Incomplete     Studies: Dg Chest 2 View  07/16/2013   CLINICAL DATA:  Fever, weakness, cough, congestion.  EXAM: CHEST  2 VIEW  COMPARISON:  07/15/2013  FINDINGS: Scarring noted in the left lingula, stable. Heart is upper limits normal in size. Right lung is clear. No acute bony abnormality. Old left rib fractures.  IMPRESSION: Chronic changes.  No active disease.   Electronically Signed   By: Rolm Baptise M.D.   On: 07/16/2013 10:32    Scheduled Meds: . amLODipine  5 mg Oral Daily  . aspirin  325 mg Oral Daily  . ceFEPime (MAXIPIME) IV  1 g Intravenous Q12H  . diazepam  5 mg Oral QHS  . pantoprazole  40 mg Oral Daily   Continuous Infusions: . sodium chloride 75 mL/hr at 07/17/13 1000    Principal Problem:   UTI (urinary tract infection) Active Problems:   HYPERTENSION   COPD with emphysema, gold stage A   GERD   Leukocytosis, unspecified  Time spent: 67min  [facility], [facility] Pager [phone]. If 7PM-7AM, please contact night-coverage at [email or web address], password [facility] 07/17/2013, 1:39 PM  LOS: 1 day

## 2013-07-18 ENCOUNTER — Telehealth: Payer: Self-pay

## 2013-07-18 DIAGNOSIS — N390 Urinary tract infection, site not specified: Secondary | ICD-10-CM | POA: Diagnosis not present

## 2013-07-18 DIAGNOSIS — K5900 Constipation, unspecified: Secondary | ICD-10-CM | POA: Diagnosis not present

## 2013-07-18 DIAGNOSIS — K219 Gastro-esophageal reflux disease without esophagitis: Secondary | ICD-10-CM | POA: Diagnosis not present

## 2013-07-18 DIAGNOSIS — J438 Other emphysema: Secondary | ICD-10-CM | POA: Diagnosis not present

## 2013-07-18 LAB — URINE CULTURE
CULTURE: NO GROWTH
Colony Count: NO GROWTH

## 2013-07-18 LAB — GLUCOSE, CAPILLARY: Glucose-Capillary: 97 mg/dL (ref 70–99)

## 2013-07-18 NOTE — Care Management Note (Unsigned)
    Page 1 of 1   07/18/2013     4:12:06 PM   CARE MANAGEMENT NOTE 07/18/2013  Patient:  [facility]   Account Number:  [id number]  Date Initiated:  07/18/2013  Documentation initiated by:  [facility]  Subjective/Objective Assessment:   80 year old female admitted with fever and UTI.     Action/Plan:   From home, needs HH services at d/c.   Anticipated DC Date:  07/18/2013   Anticipated DC Plan:  [place]  CM consult      [facility] Choice  Resumption Of Svcs/PTA Provider   Choice offered to / List presented to:          [facility] arranged  HH-2 PT  HH-3 OT  HH-1 RN      [place]   Status of service:  In process, will continue to follow Medicare Important Message given?  NA - LOS <3 / Initial given by admissions (If response is "NO", the following Medicare IM given date fields will be blank) Date Medicare IM given:   Date Additional Medicare IM given:    Discharge Disposition:  [place]  Per UR Regulation:  Reviewed for med. necessity/level of care/duration of stay  If discussed at [place] of Stay Meetings, dates discussed:    Comments:

## 2013-07-18 NOTE — Telephone Encounter (Signed)
[facility] Triage Call Report Triage Record Num: [id number] Operator: Feliberto Harts Patient Name: Crysten Kaman Call Date & Time: 07/16/2013 6:59:14AM Patient Phone: [phone] PCP: Darrick Penna. Swords Patient Gender: Female PCP Fax : [phone] Patient DOB: 1923/07/24 Practice Name: [facility] Reason for Call: Caller: Cathy/Other; PCP: Phoebe Sharps (Adults only); CB#: [phone]; Call regarding Vomiting and fever; Began 07/16/2013 during the night with vomiting and Temp 101 orally. Has not taken any fever reducer this morning. Having chills. Was discharged from [place]. 07/15/2013 after treatment for UTI and Pneumonia. Sent home on Keflex and took first dose at 18:00 07/15/2013. Vomiting began past midnight. Triaged Nausea and Vomiting guideline. Disposition Call Provider Immediatiately per "Unable to take or keep down prescription mediacation because of nausea or vomiting." Call to on call Dr. Charlett Blake and made aware of triage. Instructed for pt. to go back to ED for evaluation. Pt's daughter made aware and will take pt to ED. Protocol(s) Used: Nausea or Vomiting Recommended Outcome per Protocol: Call Provider Immediately Reason for Outcome: Unable to take or keep down prescription medication (heart, respiratory, diabetes, thyroid, antibiotics, birth control pills, corticosteroids) because of nausea/vomiting Care Advice: ~ 03/

## 2013-07-18 NOTE — Discharge Summary (Signed)
Physician Discharge Summary  Cayse Willock [id number] DOB: [birth year] DOA: 07/16/2013  PCP: Chancy Hurter, MD  Admit date: 07/16/2013 Discharge date: 07/18/2013  Time spent: 35 minutes  Recommendations for Outpatient Follow-up:  1. Follow up with PCP in 1-2 weeks 2. Recommend daily miralax 3. May consider amitiza if constipation becomes an issue in the future.  Discharge Diagnoses:  Principal Problem:   UTI (urinary tract infection) Active Problems:   HYPERTENSION   COPD with emphysema, gold stage A   GERD   Leukocytosis, unspecified   Discharge Condition: Improved  Diet recommendation: High fiber  Filed Weights   07/16/13 2100 07/17/13 0539 07/18/13 0536  Weight: 53.98 kg (119 lb 0.1 oz) 54.613 kg (120 lb 6.4 oz) 54.84 kg (120 lb 14.4 oz)    History of present illness:  80 year old female with past medical history of hypertension, just recent admission and hospitalization for pneumonia (HCAP) and UTI (discharged 07/15/2013) who now presented to [facility] ED 07/16/2013 with reports of fever at home, chills and had multiple episodes of vomiting last night. She was discharge on Ceftin for continuation of pneumonia treatment but said she did not feel good on the night of the discharge and has decided to return for evaluation. She reports chronic cough and productive sputum as well as shortness of breath. No other complaints of abdominal pain. No blood in stool or urine. No falls, no lightheadedness or weakness.  In ED, BP was 117/72, HR 68, Tmax 98.46F and oxygen saturation 95% on room air. Her UA showed mild leukocytes. No acute findings on CXR. Lactic acid was WNL.  Hospital Course:  UTI (urinary tract infection)  - urinalysis with trace leukocytes so continued treatment with cefepime based on previous recent urine culture (that grew Klebsiella)  - Pt to complete ceftin from previous admit HYPERTENSION  - continued norvasc  COPD with emphysema, gold stage A  - symptoms of cough and  shortness of breaath are likely from COPD  - CXR did not reveal pneumonia  GERD  - continued protonix  Leukocytosis, unspecified  - secondary to UTI  Constipation  - very good results with large BM with sorbitol and dulcolax suppository  - Per pt's daughter, the patient has a long hx of constipation and had not been taking scheduled miralax as was recommended for her  Discharge Exam: Filed Vitals:   07/17/13 0539 07/17/13 1359 07/17/13 2209 07/18/13 0536  BP: 130/64 115/48 157/79 145/64  Pulse: 59 68 100 102  Temp: 97.3 F (36.3 C) 98.1 F (36.7 C) 98.4 F (36.9 C) 98.1 F (36.7 C)  TempSrc: Oral Oral Oral Oral  Resp: 18 18 18 18   Height:      Weight: 54.613 kg (120 lb 6.4 oz)   54.84 kg (120 lb 14.4 oz)  SpO2: 98% 92% 97% 89%    General: awake, in nad Cardiovascular: regular, s1, s2 Respiratory: normal resp effort, no wheezing  Discharge Instructions       Future Appointments Provider Department Dept Phone   07/22/2013 3:45 PM Elsie Stain, MD [facility] [phone]       Medication List         acetaminophen 325 MG tablet  Commonly known as:  TYLENOL  Take 650 mg by mouth every 6 (six) hours as needed for mild pain.     AMBULATORY NON FORMULARY MEDICATION  Oxygen at bedtime-2 or 2 1/2 liters     amLODipine 5 MG tablet  Commonly known as:  NORVASC  Take 1 tablet (5 mg total) by mouth daily.     aspirin 325 MG tablet  Take 325 mg by mouth daily.     cefUROXime 250 MG tablet  Commonly known as:  CEFTIN  Take 1 tablet (250 mg total) by mouth 2 (two) times daily with a meal.     diazepam 5 MG tablet  Commonly known as:  VALIUM  Take 1 tablet (5 mg total) by mouth at bedtime.     hydrOXYzine 10 MG tablet  Commonly known as:  ATARAX/VISTARIL  Take 10 mg by mouth every 4 (four) hours as needed for itching.     nitrofurantoin (macrocrystal-monohydrate) 100 MG capsule  Commonly known as:  MACROBID  Take 1 capsule by mouth 2 (two) times  daily. For 5 days     nitroGLYCERIN 0.4 MG SL tablet  Commonly known as:  NITROSTAT  Place 0.4 mg under the tongue every 5 (five) minutes as needed for chest pain.     omeprazole 20 MG capsule  Commonly known as:  PRILOSEC  Take 20 mg by mouth daily.     polyethylene glycol packet  Commonly known as:  MIRALAX / GLYCOLAX  Take 17 g by mouth daily as needed (for constipation).       Allergies  Allergen Reactions  . Azithromycin Hives, Diarrhea, Dermatitis and Rash  . Ciprofloxacin Swelling  . Levaquin [Levofloxacin] Hives  . Penicillins Hives    has had mild doses that she did not have reactions to  . Prednisone     Unknown   . Sulfamethoxazole Nausea And Vomiting   Follow-up Information   Follow up with Chancy Hurter, MD. Schedule an appointment as soon as possible for a visit in 1 week.   Specialties:  Internal Medicine, Radiology   Contact information:   [place] [id number] [phone]        The results of significant diagnostics from this hospitalization (including imaging, microbiology, ancillary and laboratory) are listed below for reference.    Significant Diagnostic Studies: Dg Chest 2 View  07/16/2013   CLINICAL DATA:  Fever, weakness, cough, congestion.  EXAM: CHEST  2 VIEW  COMPARISON:  07/15/2013  FINDINGS: Scarring noted in the left lingula, stable. Heart is upper limits normal in size. Right lung is clear. No acute bony abnormality. Old left rib fractures.  IMPRESSION: Chronic changes.  No active disease.   Electronically Signed   By: Rolm Baptise M.D.   On: 07/16/2013 10:32   Dg Chest 2 View  07/15/2013   CLINICAL DATA:  Pneumonia, history of breast cancer  EXAM: CHEST  2 VIEW  COMPARISON:  07/13/2013  FINDINGS: Cardiomegaly again noted. Stable postsurgical changes and volume loss in left lung. Surgical clips in right axilla again noted. No definite superimposed infiltrate or pulmonary edema. Stable mild infrahilar bronchitic  changes.  IMPRESSION: Stable postsurgical changes and volume loss in left lung. Surgical clips in right axilla again noted. No definite superimposed infiltrate or pulmonary edema. Stable mild infrahilar bronchitic changes.   Electronically Signed   By: Lahoma Crocker M.D.   On: 07/15/2013 11:47   Ct Chest Wo Contrast  07/08/2013   CLINICAL DATA:  Shortness of breath, weakness, prior abnormal CT chest. History of right breast cancer.  EXAM: CT CHEST WITHOUT CONTRAST  TECHNIQUE: Multidetector CT imaging of the chest was performed following the standard protocol without IV contrast.  COMPARISON:  CT CHEST W/CM dated 03/14/2013; CT ABD-PEL WO/W CM dated 01/04/2013  FINDINGS: No pathologically enlarged mediastinal or axillary lymph nodes. Surgical clips are seen in the right axilla. Hilar regions are difficult to definitively evaluate without IV contrast but appear grossly unremarkable. Heart size within normal limits. Coronary artery calcification. No pericardial effusion.  Minimal biapical pleural parenchymal scarring. Moderate centrilobular emphysema. Mild changes of subpleural radiation fibrosis in the anterior right lung. Mild scarring in the right middle lobe. Granulomatous calcification in the left upper lobe with associated mild architectural distortion and volume loss. Scarring in the lingula. Interval regression of a subpleural opacity in the lateral aspect of the left lower lobe (image 39). There are a few scattered tiny pulmonary nodular densities measuring 3 mm or less in size. 3 mm right lower lobe nodule (example series 3, image 38) is unchanged from 01/04/2013. Note is made that comparison with 03/14/2013 is difficult due to expiratory phase imaging on that exam. Pleural fluid. Airway is unremarkable.  Incidental imaging of the upper abdomen shows the visualized portions of the liver, adrenal glands, spleen, pancreas, stomach and bowel to be grossly unremarkable. No upper abdominal adenopathy. No worrisome  lytic or sclerotic lesions. Degenerative changes are seen in the spine. Pectus deformity.  IMPRESSION: 1. No acute findings to explain the patient's shortness of breath. 2. Coronary artery calcification. 3. Few scattered tiny pulmonary nodular densities measuring 3 mm or less in size. Comparison with the prior examination is difficult due to expiratory phase imaging on that exam. Given risk factors for bronchogenic carcinoma, follow-up chest CT at 1 year is recommended. This recommendation follows the consensus statement: Guidelines for Management of Small Pulmonary Nodules Detected on CT Scans: A Statement from the [employer] as published in Radiology 2005; 237:395-400.   Electronically Signed   By: Lorin Picket M.D.   On: 07/08/2013 14:50   Dg Chest Port 1 View  07/13/2013   CLINICAL DATA:  FEVER COUGH FEVER COUGH  EXAM: PORTABLE CHEST - 1 VIEW  COMPARISON:  CT CHEST W/O CM dated 07/08/2013; DG CHEST 2 VIEW dated 03/30/2013  FINDINGS: Low lung volumes. The cardiac silhouette is enlarged. A atherosclerotic calcifications are identified within the aorta. Small to moderate left pleural effusions appreciated. There is diffuse thickening of the interstitial markings slightly more prominent when compared to previous study. This finding is accentuated by the low lung volumes. No further focal regions of consolidation appreciated. Remote rib fractures identified within the posterior lateral aspect of the left hemi thorax. The bones are osteopenic.  IMPRESSION: Chronic bronchitic changes these findings accentuated by the patient's low lung volumes. Underlying component of mild pulmonary edema cannot be excluded.  Small to moderate left pleural effusion. A concomitant area of pulmonary infiltrate and/or atelectasis cannot be excluded. Surveillance evaluation with PA and lateral chest radiographs is recommended.   Electronically Signed   By: Margaree Mackintosh M.D.   On: 07/13/2013 15:30    Microbiology: Recent  Results (from the past 240 hour(s))  URINE CULTURE     Status: None   Collection Time    07/12/13  9:37 AM      Result Value Ref Range Status   Culture KLEBSIELLA PNEUMONIAE   Final   Colony Count >=100,000 COLONIES/ML   Final   Organism ID, Bacteria KLEBSIELLA PNEUMONIAE   Final  CULTURE, BLOOD (ROUTINE X 2)     Status: None   Collection Time    07/13/13  2:54 PM      Result Value Ref Range Status   Specimen Description BLOOD LEFT ARM  Final   Special Requests BOTTLES DRAWN AEROBIC AND ANAEROBIC 5ML   Final   Culture  Setup Time     Final   Value: 07/13/2013 20:58     Performed at [facility]   Culture     Final   Value:        BLOOD CULTURE RECEIVED NO GROWTH TO DATE CULTURE WILL BE HELD FOR 5 DAYS BEFORE ISSUING A FINAL NEGATIVE REPORT     Performed at [facility]   Report Status PENDING   Incomplete  CULTURE, BLOOD (ROUTINE X 2)     Status: None   Collection Time    07/13/13  2:54 PM      Result Value Ref Range Status   Specimen Description BLOOD LEFT ARM   Final   Special Requests BOTTLES DRAWN AEROBIC AND ANAEROBIC 5ML   Final   Culture  Setup Time     Final   Value: 07/13/2013 20:58     Performed at [facility]   Culture     Final   Value:        BLOOD CULTURE RECEIVED NO GROWTH TO DATE CULTURE WILL BE HELD FOR 5 DAYS BEFORE ISSUING A FINAL NEGATIVE REPORT     Performed at [facility]   Report Status PENDING   Incomplete  URINE CULTURE     Status: None   Collection Time    07/13/13  3:52 PM      Result Value Ref Range Status   Specimen Description URINE, CLEAN CATCH   Final   Special Requests NONE   Final   Culture  Setup Time     Final   Value: 07/14/2013 03:00     Performed at [facility] Count     Final   Value: >=100,000 COLONIES/ML     Performed at [facility]   Culture     Final   Value: KLEBSIELLA PNEUMONIAE     Performed at [facility]   Report Status 07/15/2013 FINAL   Final    Organism ID, Bacteria KLEBSIELLA PNEUMONIAE   Final  CULTURE, BLOOD (ROUTINE X 2)     Status: None   Collection Time    07/16/13 11:57 AM      Result Value Ref Range Status   Specimen Description BLOOD LEFT ARM   Final   Special Requests BOTTLES DRAWN AEROBIC AND ANAEROBIC 5CC EACH   Final   Culture  Setup Time     Final   Value: 07/16/2013 19:21     Performed at [facility]   Culture     Final   Value:        BLOOD CULTURE RECEIVED NO GROWTH TO DATE CULTURE WILL BE HELD FOR 5 DAYS BEFORE ISSUING A FINAL NEGATIVE REPORT     Performed at [facility]   Report Status PENDING   Incomplete  CULTURE, BLOOD (ROUTINE X 2)     Status: None   Collection Time    07/16/13 12:02 PM      Result Value Ref Range Status   Specimen Description BLOOD LEFT WRIST   Final   Special Requests BOTTLES DRAWN AEROBIC AND ANAEROBIC [facility] EACH   Final   Culture  Setup Time     Final   Value: 07/16/2013 19:21     Performed at [facility]   Culture     Final   Value:  BLOOD CULTURE RECEIVED NO GROWTH TO DATE CULTURE WILL BE HELD FOR 5 DAYS BEFORE ISSUING A FINAL NEGATIVE REPORT     Performed at [facility]   Report Status PENDING   Incomplete     Labs: Basic Metabolic Panel:  Recent Labs Lab 07/13/13 1454 07/15/13 1000 07/16/13 1005 07/17/13 0537  NA 138 139 137 140  K 3.8 4.0 3.6* 3.4*  CL 100 104 97 105  CO2 25 24 23 25   GLUCOSE 128* 90 128* 118*  BUN 11 10 10 8   CREATININE 0.83 0.69 0.70 0.62  CALCIUM 9.1 8.6 9.2 8.4   Liver Function Tests:  Recent Labs Lab 07/13/13 1531 07/16/13 1005 07/17/13 0537  AST 16 16 10   ALT 10 11 8   ALKPHOS 64 58 53  BILITOT 1.1 1.2 0.3  PROT 6.7 6.7 5.8*  ALBUMIN 3.6 3.6 2.7*   No results found for this basename: LIPASE, AMYLASE,  in the last 168 hours No results found for this basename: AMMONIA,  in the last 168 hours CBC:  Recent Labs Lab 07/13/13 1454 07/14/13 0948 07/15/13 1000 07/16/13 1005  07/17/13 0537  WBC 20.9* 12.4* 6.3 17.8* 9.6  NEUTROABS 19.1* 10.5* 4.1 16.4*  --   HGB 12.8 12.4 12.3 14.0 11.6*  HCT 37.3 37.7 36.5 39.8 34.0*  MCV 85.9 88.1 87.3 84.7 85.2  PLT 128* 147* 144* 167 179   Cardiac Enzymes: No results found for this basename: CKTOTAL, CKMB, CKMBINDEX, TROPONINI,  in the last 168 hours BNP: BNP (last 3 results)  Recent Labs  07/13/13 1454  PROBNP 489.0*   CBG:  Recent Labs Lab 07/17/13 0728 07/18/13 0752  GLUCAP 109* 97   Signed:  Raoul Ciano K  [facility] 07/18/2013, 3:19 PM

## 2013-07-18 NOTE — Progress Notes (Signed)
INITIAL NUTRITION ASSESSMENT  DOCUMENTATION CODES Per approved criteria  -Not Applicable   INTERVENTION: - Encouraged increased meal intake - Will continue to monitor   NUTRITION DIAGNOSIS: Altered GI function related to constipation, now loose stools as evidenced by MD notes.    Goal: 1. Pt to have normal formed BMs 2. Pt to consume >90% of meals  Monitor:  Weights, labs, intake, BMs  Reason for Assessment: Malnutrition screening tool   80 y.o. female  Admitting Dx: UTI (urinary tract infection)  ASSESSMENT: Pt recently d/c from this hospital 07/15/13 for pneumonia and UTI. Admitted with vomiting and having fever and chills starting 3/6. Reported in ED that she vomiting 10-11 times. C/o constipation yesterday, was given Dulcolax suppository yesterday.   Met with pt who reports not eating anything since d/c. Reports she was up every 30 minutes last night having stool from "explosive" medication she was given for her constipation. C/o being tired, wanted to rest, unable to do nutrition focused physical exam at this time. Seen by inpatient RD earlier this month who noted pt with hx of unintentional wt loss of 15 lbs in November that occurred over a several week period while she was hospitalized with pt being unable to regain weight since then. Diet recall prior to last admission was 2 meals/day as pt sleeping through breakfast. Pt ate well during past admission, 75% of meals. Pt does not like nutritional supplements.    Potassium low   Height: Ht Readings from Last 1 Encounters:  07/16/13 _0  (1.549 m)    Weight: Wt Readings from Last 1 Encounters:  07/18/13 120 lb 14.4 oz (54.84 kg)    Ideal Body Weight: 105 lb   % Ideal Body Weight: 114%  Wt Readings from Last 10 Encounters:  07/18/13 120 lb 14.4 oz (54.84 kg)  07/13/13 119 lb (53.978 kg)  07/12/13 119 lb (53.978 kg)  06/06/13 123 lb (55.792 kg)  04/25/13 124 lb (56.246 kg)  04/08/13 124 lb 6.4 oz (56.427 kg)   03/30/13 121 lb 14.6 oz (55.3 kg)  03/30/13 124 lb 3.2 oz (56.337 kg)  03/14/13 128 lb 15.5 oz (58.5 kg)  03/04/13 130 lb (58.968 kg)    Usual Body Weight: 134 lbs   % Usual Body Weight: 89%  BMI:  Body mass index is 22.86 kg/(m^2).  Estimated Nutritional Needs: Kcal: 1350-1550  Protein: 55-65 gram  Fluid: >/=1600 ml/daily   Skin: Intact   Diet Order: General  EDUCATION NEEDS: -No education needs identified at this time   Intake/Output Summary (Last 24 hours) at 07/18/13 1316 Last data filed at 07/18/13 0438  Gross per 24 hour  Intake      0 ml  Output    600 ml  Net   -600 ml    Last BM: 3/8  Labs:   Recent Labs Lab 07/15/13 1000 07/16/13 1005 07/17/13 0537  NA 139 137 140  K 4.0 3.6* 3.4*  CL 104 97 105  CO2 _1 BUN _2 CREATININE 0.69 0.70 0.62  CALCIUM 8.6 9.2 8.4  GLUCOSE 90 128* 118*    CBG (last 3)   Recent Labs  07/17/13 0728 07/18/13 0752  GLUCAP 109* 97    Scheduled Meds: . amLODipine  5 mg Oral Daily  . aspirin  325 mg Oral Daily  . ceFEPime (MAXIPIME) IV  1 g Intravenous Q12H  . diazepam  5 mg Oral QHS  . pantoprazole  40 mg Oral Daily  Continuous Infusions: . sodium chloride 75 mL (07/18/13 1024)    Past Medical History  Diagnosis Date  . ANXIETY 08/16/2008  . CAD, UNSPECIFIED SITE 10/17/2008  . CHOLELITHIASIS 08/23/2008  . COPD 01/04/2010    FeV1 101%, DLCO64% 2008  . 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 DIVERTICULOSIS-COLON 04/07/2008  . GERD 04/19/2007  . HYPERSOMNIA, ASSOCIATED WITH SLEEP APNEA 02/01/2007  . HYPERTENSION 11/12/2006  . MITRAL VALVE PROLAPSE, HX OF 10/07/2008  . NEOPLASM, MALIGNANT, BREAST, RIGHT 02/15/2008  . OSTEOPOROSIS 04/04/2008  . Raynaud's syndrome 04/25/2009  . Airway obstruction   . History of diverticulitis of colon   . Tortuous colon   . Anal stricture   . Chronic constipation   . Fatty liver   . Hiatal hernia   . Esophageal stricture   . IBS (irritable bowel  syndrome)   . Pulmonary fibrosis   . Sleep apnea   . COPD (chronic obstructive pulmonary disease)   . Colitis     sigmoid  . Candida esophagitis   . Diverticulitis   . Hearing aid worn     bilateral  . Atrial fibrillation     she denies known hx of afib 02/24/13  . Complication of anesthesia     difficult intubation    Past Surgical History  Procedure Laterality Date  . Abdominal hysterectomy    . Bilateral salpingoophorectomy      s/p prolapsed bladder  . Bladder surgery      prolapsed  . Thoracotomy      granuloma, pulmonary-thoracotomy  . Breast surgery  07-21-07    mastectomy (right), all margins neg and LNs neg   . Appendectomy    . Tonsillectomy    . Rotator cuff repair    . Mastectomy      right  . Breast cyst excision      left  . Mouth surgery    . Eye surgery      cataract removal bilateral  . Melanoma excision      rt clavicle Dr Rhoderick [facility] office)  . Lung surgery      Mikey College MS, [place], [place] Pager [phone] After Hours Pager

## 2013-07-19 LAB — CULTURE, BLOOD (ROUTINE X 2)
CULTURE: NO GROWTH
Culture: NO GROWTH

## 2013-07-20 ENCOUNTER — Other Ambulatory Visit: Payer: Self-pay | Admitting: *Deleted

## 2013-07-20 DIAGNOSIS — R634 Abnormal weight loss: Secondary | ICD-10-CM | POA: Diagnosis not present

## 2013-07-20 DIAGNOSIS — I2510 Atherosclerotic heart disease of native coronary artery without angina pectoris: Secondary | ICD-10-CM | POA: Diagnosis not present

## 2013-07-20 DIAGNOSIS — M6281 Muscle weakness (generalized): Secondary | ICD-10-CM | POA: Diagnosis not present

## 2013-07-20 DIAGNOSIS — N390 Urinary tract infection, site not specified: Secondary | ICD-10-CM | POA: Diagnosis not present

## 2013-07-20 DIAGNOSIS — J150 Pneumonia due to Klebsiella pneumoniae: Secondary | ICD-10-CM | POA: Diagnosis not present

## 2013-07-20 DIAGNOSIS — J441 Chronic obstructive pulmonary disease with (acute) exacerbation: Secondary | ICD-10-CM | POA: Diagnosis not present

## 2013-07-20 MED ORDER — NITROFURANTOIN MONOHYD MACRO 100 MG PO CAPS: 100.0000 mg | ORAL_CAPSULE | Freq: Two times a day (BID) | ORAL | Status: DC

## 2013-07-22 ENCOUNTER — Ambulatory Visit: Payer: Medicare Other | Admitting: Critical Care Medicine

## 2013-07-22 LAB — CULTURE, BLOOD (ROUTINE X 2)
CULTURE: NO GROWTH
CULTURE: NO GROWTH

## 2013-07-24 ENCOUNTER — Emergency Department (HOSPITAL_COMMUNITY): Payer: Medicare Other

## 2013-07-24 ENCOUNTER — Encounter (HOSPITAL_COMMUNITY): Payer: Self-pay | Admitting: Emergency Medicine

## 2013-07-24 ENCOUNTER — Inpatient Hospital Stay (HOSPITAL_COMMUNITY): Payer: Medicare Other

## 2013-07-24 ENCOUNTER — Inpatient Hospital Stay (HOSPITAL_COMMUNITY)
Admission: EM | Admit: 2013-07-24 | Discharge: 2013-07-27 | DRG: 871 | Disposition: A | Discharge status: Skilled Nursing Facility | Payer: Medicare Other | Attending: Internal Medicine | Admitting: Internal Medicine

## 2013-07-24 DIAGNOSIS — J449 Chronic obstructive pulmonary disease, unspecified: Secondary | ICD-10-CM | POA: Diagnosis not present

## 2013-07-24 DIAGNOSIS — K222 Esophageal obstruction: Secondary | ICD-10-CM | POA: Diagnosis present

## 2013-07-24 DIAGNOSIS — A419 Sepsis, unspecified organism: Principal | ICD-10-CM | POA: Diagnosis present

## 2013-07-24 DIAGNOSIS — Z7982 Long term (current) use of aspirin: Secondary | ICD-10-CM

## 2013-07-24 DIAGNOSIS — R404 Transient alteration of awareness: Secondary | ICD-10-CM | POA: Diagnosis present

## 2013-07-24 DIAGNOSIS — D72829 Elevated white blood cell count, unspecified: Secondary | ICD-10-CM

## 2013-07-24 DIAGNOSIS — K2100 Gastro-esophageal reflux disease with esophagitis, without bleeding: Secondary | ICD-10-CM | POA: Diagnosis not present

## 2013-07-24 DIAGNOSIS — E876 Hypokalemia: Secondary | ICD-10-CM | POA: Diagnosis present

## 2013-07-24 DIAGNOSIS — R0789 Other chest pain: Secondary | ICD-10-CM | POA: Diagnosis not present

## 2013-07-24 DIAGNOSIS — I10 Essential (primary) hypertension: Secondary | ICD-10-CM | POA: Diagnosis not present

## 2013-07-24 DIAGNOSIS — I6990 Unspecified sequelae of unspecified cerebrovascular disease: Secondary | ICD-10-CM | POA: Diagnosis not present

## 2013-07-24 DIAGNOSIS — I6320 Cerebral infarction due to unspecified occlusion or stenosis of unspecified precerebral arteries: Secondary | ICD-10-CM | POA: Diagnosis present

## 2013-07-24 DIAGNOSIS — D649 Anemia, unspecified: Secondary | ICD-10-CM | POA: Diagnosis present

## 2013-07-24 DIAGNOSIS — Z87891 Personal history of nicotine dependence: Secondary | ICD-10-CM

## 2013-07-24 DIAGNOSIS — Z880 Allergy status to penicillin: Secondary | ICD-10-CM

## 2013-07-24 DIAGNOSIS — J8410 Pulmonary fibrosis, unspecified: Secondary | ICD-10-CM | POA: Diagnosis present

## 2013-07-24 DIAGNOSIS — Z882 Allergy status to sulfonamides status: Secondary | ICD-10-CM

## 2013-07-24 DIAGNOSIS — I4891 Unspecified atrial fibrillation: Secondary | ICD-10-CM | POA: Diagnosis present

## 2013-07-24 DIAGNOSIS — Z8249 Family history of ischemic heart disease and other diseases of the circulatory system: Secondary | ICD-10-CM

## 2013-07-24 DIAGNOSIS — R0602 Shortness of breath: Secondary | ICD-10-CM | POA: Diagnosis not present

## 2013-07-24 DIAGNOSIS — R6510 Systemic inflammatory response syndrome (SIRS) of non-infectious origin without acute organ dysfunction: Secondary | ICD-10-CM | POA: Diagnosis not present

## 2013-07-24 DIAGNOSIS — R509 Fever, unspecified: Secondary | ICD-10-CM | POA: Diagnosis not present

## 2013-07-24 DIAGNOSIS — Z79899 Other long term (current) drug therapy: Secondary | ICD-10-CM

## 2013-07-24 DIAGNOSIS — Z888 Allergy status to other drugs, medicaments and biological substances status: Secondary | ICD-10-CM

## 2013-07-24 DIAGNOSIS — I2510 Atherosclerotic heart disease of native coronary artery without angina pectoris: Secondary | ICD-10-CM | POA: Diagnosis present

## 2013-07-24 DIAGNOSIS — J209 Acute bronchitis, unspecified: Secondary | ICD-10-CM

## 2013-07-24 DIAGNOSIS — R079 Chest pain, unspecified: Secondary | ICD-10-CM | POA: Diagnosis not present

## 2013-07-24 DIAGNOSIS — J9610 Chronic respiratory failure, unspecified whether with hypoxia or hypercapnia: Secondary | ICD-10-CM | POA: Diagnosis present

## 2013-07-24 DIAGNOSIS — Z853 Personal history of malignant neoplasm of breast: Secondary | ICD-10-CM | POA: Diagnosis not present

## 2013-07-24 DIAGNOSIS — I059 Rheumatic mitral valve disease, unspecified: Secondary | ICD-10-CM | POA: Diagnosis present

## 2013-07-24 DIAGNOSIS — Z803 Family history of malignant neoplasm of breast: Secondary | ICD-10-CM

## 2013-07-24 DIAGNOSIS — K219 Gastro-esophageal reflux disease without esophagitis: Secondary | ICD-10-CM | POA: Diagnosis present

## 2013-07-24 DIAGNOSIS — Z881 Allergy status to other antibiotic agents status: Secondary | ICD-10-CM

## 2013-07-24 DIAGNOSIS — R279 Unspecified lack of coordination: Secondary | ICD-10-CM | POA: Diagnosis not present

## 2013-07-24 DIAGNOSIS — I63239 Cerebral infarction due to unspecified occlusion or stenosis of unspecified carotid arteries: Secondary | ICD-10-CM | POA: Diagnosis present

## 2013-07-24 DIAGNOSIS — M810 Age-related osteoporosis without current pathological fracture: Secondary | ICD-10-CM | POA: Diagnosis present

## 2013-07-24 DIAGNOSIS — R0902 Hypoxemia: Secondary | ICD-10-CM | POA: Diagnosis not present

## 2013-07-24 DIAGNOSIS — K5730 Diverticulosis of large intestine without perforation or abscess without bleeding: Secondary | ICD-10-CM | POA: Diagnosis not present

## 2013-07-24 DIAGNOSIS — N644 Mastodynia: Secondary | ICD-10-CM | POA: Diagnosis present

## 2013-07-24 DIAGNOSIS — F411 Generalized anxiety disorder: Secondary | ICD-10-CM | POA: Diagnosis present

## 2013-07-24 DIAGNOSIS — R262 Difficulty in walking, not elsewhere classified: Secondary | ICD-10-CM | POA: Diagnosis not present

## 2013-07-24 DIAGNOSIS — J189 Pneumonia, unspecified organism: Secondary | ICD-10-CM | POA: Diagnosis present

## 2013-07-24 DIAGNOSIS — R32 Unspecified urinary incontinence: Secondary | ICD-10-CM | POA: Diagnosis present

## 2013-07-24 DIAGNOSIS — Z85820 Personal history of malignant melanoma of skin: Secondary | ICD-10-CM

## 2013-07-24 DIAGNOSIS — J439 Emphysema, unspecified: Secondary | ICD-10-CM | POA: Diagnosis present

## 2013-07-24 DIAGNOSIS — R4182 Altered mental status, unspecified: Secondary | ICD-10-CM | POA: Diagnosis not present

## 2013-07-24 DIAGNOSIS — M6281 Muscle weakness (generalized): Secondary | ICD-10-CM | POA: Diagnosis not present

## 2013-07-24 DIAGNOSIS — I639 Cerebral infarction, unspecified: Secondary | ICD-10-CM

## 2013-07-24 DIAGNOSIS — I6350 Cerebral infarction due to unspecified occlusion or stenosis of unspecified cerebral artery: Secondary | ICD-10-CM | POA: Diagnosis present

## 2013-07-24 DIAGNOSIS — R918 Other nonspecific abnormal finding of lung field: Secondary | ICD-10-CM | POA: Diagnosis present

## 2013-07-24 DIAGNOSIS — J438 Other emphysema: Secondary | ICD-10-CM | POA: Diagnosis present

## 2013-07-24 DIAGNOSIS — G5790 Unspecified mononeuropathy of unspecified lower limb: Secondary | ICD-10-CM | POA: Diagnosis present

## 2013-07-24 DIAGNOSIS — K7689 Other specified diseases of liver: Secondary | ICD-10-CM | POA: Diagnosis present

## 2013-07-24 DIAGNOSIS — G4730 Sleep apnea, unspecified: Secondary | ICD-10-CM | POA: Diagnosis present

## 2013-07-24 DIAGNOSIS — R930 Abnormal findings on diagnostic imaging of skull and head, not elsewhere classified: Secondary | ICD-10-CM | POA: Diagnosis not present

## 2013-07-24 DIAGNOSIS — K5900 Constipation, unspecified: Secondary | ICD-10-CM | POA: Diagnosis present

## 2013-07-24 DIAGNOSIS — I7300 Raynaud's syndrome without gangrene: Secondary | ICD-10-CM | POA: Diagnosis present

## 2013-07-24 DIAGNOSIS — K589 Irritable bowel syndrome without diarrhea: Secondary | ICD-10-CM | POA: Diagnosis present

## 2013-07-24 LAB — CBC WITH DIFFERENTIAL/PLATELET
BASOS ABS: 0 10*3/uL (ref 0.0–0.1)
BASOS PCT: 0 % (ref 0–1)
EOS ABS: 0.1 10*3/uL (ref 0.0–0.7)
EOS PCT: 1 % (ref 0–5)
HEMATOCRIT: 37.4 % (ref 36.0–46.0)
HEMOGLOBIN: 12.6 g/dL (ref 12.0–15.0)
Lymphocytes Relative: 4 % — ABNORMAL LOW (ref 12–46)
Lymphs Abs: 1 10*3/uL (ref 0.7–4.0)
MCH: 29.4 pg (ref 26.0–34.0)
MCHC: 33.7 g/dL (ref 30.0–36.0)
MCV: 87.4 fL (ref 78.0–100.0)
MONO ABS: 0.6 10*3/uL (ref 0.1–1.0)
MONOS PCT: 3 % (ref 3–12)
Neutro Abs: 20.9 10*3/uL — ABNORMAL HIGH (ref 1.7–7.7)
Neutrophils Relative %: 92 % — ABNORMAL HIGH (ref 43–77)
Platelets: 245 10*3/uL (ref 150–400)
RBC: 4.28 MIL/uL (ref 3.87–5.11)
RDW: 14.4 % (ref 11.5–15.5)
WBC: 22.7 10*3/uL — ABNORMAL HIGH (ref 4.0–10.5)

## 2013-07-24 LAB — I-STAT CHEM 8, ED
BUN: 17 mg/dL (ref 6–23)
Calcium, Ion: 1.13 mmol/L (ref 1.13–1.30)
Chloride: 98 mEq/L (ref 96–112)
Creatinine, Ser: 0.7 mg/dL (ref 0.50–1.10)
Glucose, Bld: 104 mg/dL — ABNORMAL HIGH (ref 70–99)
HEMATOCRIT: 37 % (ref 36.0–46.0)
HEMOGLOBIN: 12.6 g/dL (ref 12.0–15.0)
POTASSIUM: 3 meq/L — AB (ref 3.7–5.3)
Sodium: 139 mEq/L (ref 137–147)
TCO2: 27 mmol/L (ref 0–100)

## 2013-07-24 LAB — PRO B NATRIURETIC PEPTIDE: Pro B Natriuretic peptide (BNP): 226.8 pg/mL (ref 0–450)

## 2013-07-24 LAB — COMPREHENSIVE METABOLIC PANEL
ALBUMIN: 3.1 g/dL — AB (ref 3.5–5.2)
ALT: 8 U/L (ref 0–35)
AST: 17 U/L (ref 0–37)
Alkaline Phosphatase: 64 U/L (ref 39–117)
BUN: 19 mg/dL (ref 6–23)
CALCIUM: 9.2 mg/dL (ref 8.4–10.5)
CO2: 27 mEq/L (ref 19–32)
CREATININE: 0.66 mg/dL (ref 0.50–1.10)
Chloride: 95 mEq/L — ABNORMAL LOW (ref 96–112)
GFR calc Af Amer: 89 mL/min — ABNORMAL LOW (ref >90)
GFR calc non Af Amer: 77 mL/min — ABNORMAL LOW (ref >90)
Glucose, Bld: 139 mg/dL — ABNORMAL HIGH (ref 70–99)
Potassium: 3.3 mEq/L — ABNORMAL LOW (ref 3.7–5.3)
Sodium: 135 mEq/L — ABNORMAL LOW (ref 137–147)
TOTAL PROTEIN: 6.5 g/dL (ref 6.0–8.3)
Total Bilirubin: 0.5 mg/dL (ref 0.3–1.2)

## 2013-07-24 LAB — URINALYSIS, ROUTINE W REFLEX MICROSCOPIC
BILIRUBIN URINE: NEGATIVE
Glucose, UA: NEGATIVE mg/dL
HGB URINE DIPSTICK: NEGATIVE
Ketones, ur: NEGATIVE mg/dL
Nitrite: NEGATIVE
Protein, ur: NEGATIVE mg/dL
SPECIFIC GRAVITY, URINE: 1.019 (ref 1.005–1.030)
UROBILINOGEN UA: 0.2 mg/dL (ref 0.0–1.0)
pH: 5 (ref 5.0–8.0)

## 2013-07-24 LAB — URINE MICROSCOPIC-ADD ON

## 2013-07-24 LAB — TROPONIN I: Troponin I: <0.3 ng/mL (ref <0.30)

## 2013-07-24 LAB — I-STAT CG4 LACTIC ACID, ED: LACTIC ACID, VENOUS: 1.61 mmol/L (ref 0.5–2.2)

## 2013-07-24 MED ORDER — DEXTROSE 5 % IV SOLN
1.0000 g | Freq: Three times a day (TID) | INTRAVENOUS | Status: DC
  Administered 2013-07-25 – 2013-07-26 (×5): 1 g via INTRAVENOUS
  Filled 2013-07-24 (×6): qty 1

## 2013-07-24 MED ORDER — DIAZEPAM 5 MG PO TABS
5.0000 mg | ORAL_TABLET | Freq: Every evening | ORAL | Status: DC | PRN
Start: 2013-07-24 — End: 2013-07-26
  Administered 2013-07-25 (×2): 5 mg via ORAL
  Filled 2013-07-24 (×2): qty 1

## 2013-07-24 MED ORDER — VANCOMYCIN HCL IN DEXTROSE 750-5 MG/150ML-% IV SOLN
750.0000 mg | INTRAVENOUS | Status: DC
  Administered 2013-07-25: 750 mg via INTRAVENOUS
  Filled 2013-07-24 (×2): qty 150

## 2013-07-24 MED ORDER — VANCOMYCIN HCL IN DEXTROSE 1-5 GM/200ML-% IV SOLN
1000.0000 mg | Freq: Once | INTRAVENOUS | Status: AC
Start: 2013-07-24 — End: 2013-07-24
  Administered 2013-07-24: 1000 mg via INTRAVENOUS
  Filled 2013-07-24: qty 200

## 2013-07-24 MED ORDER — SODIUM CHLORIDE 0.9 % IV BOLUS (SEPSIS)
500.0000 mL | Freq: Once | INTRAVENOUS | Status: AC
  Administered 2013-07-24: 500 mL via INTRAVENOUS

## 2013-07-24 MED ORDER — ALBUTEROL SULFATE (2.5 MG/3ML) 0.083% IN NEBU: 2.5000 mg | INHALATION_SOLUTION | Freq: Four times a day (QID) | RESPIRATORY_TRACT | Status: DC

## 2013-07-24 MED ORDER — IPRATROPIUM-ALBUTEROL 0.5-2.5 (3) MG/3ML IN SOLN
3.0000 mL | Freq: Four times a day (QID) | RESPIRATORY_TRACT | Status: DC
  Administered 2013-07-25 – 2013-07-26 (×5): 3 mL via RESPIRATORY_TRACT
  Filled 2013-07-24 (×5): qty 3

## 2013-07-24 MED ORDER — POTASSIUM CHLORIDE CRYS ER 20 MEQ PO TBCR
20.0000 meq | EXTENDED_RELEASE_TABLET | Freq: Once | ORAL | Status: AC
  Administered 2013-07-24: 20 meq via ORAL
  Filled 2013-07-24: qty 1

## 2013-07-24 MED ORDER — ACETAMINOPHEN 650 MG RE SUPP: 650.0000 mg | Freq: Four times a day (QID) | RECTAL | Status: DC | PRN

## 2013-07-24 MED ORDER — SODIUM CHLORIDE 0.9 % IJ SOLN
3.0000 mL | Freq: Two times a day (BID) | INTRAMUSCULAR | Status: DC
  Administered 2013-07-26 (×2): 3 mL via INTRAVENOUS

## 2013-07-24 MED ORDER — BIOTENE DRY MOUTH MT LIQD
15.0000 mL | Freq: Two times a day (BID) | OROMUCOSAL | Status: DC
  Administered 2013-07-25 – 2013-07-27 (×5): 15 mL via OROMUCOSAL

## 2013-07-24 MED ORDER — SODIUM CHLORIDE 0.9 % IV SOLN
INTRAVENOUS | Status: AC
  Administered 2013-07-24 – 2013-07-25 (×2): via INTRAVENOUS

## 2013-07-24 MED ORDER — IOHEXOL 300 MG/ML  SOLN
50.0000 mL | Freq: Once | INTRAMUSCULAR | Status: AC | PRN
  Administered 2013-07-24: 50 mL via ORAL

## 2013-07-24 MED ORDER — DEXTROSE 5 % IV SOLN
1.0000 g | Freq: Once | INTRAVENOUS | Status: AC
  Administered 2013-07-24: 1 g via INTRAVENOUS
  Filled 2013-07-24: qty 1

## 2013-07-24 MED ORDER — ALBUTEROL SULFATE (2.5 MG/3ML) 0.083% IN NEBU: 2.5000 mg | INHALATION_SOLUTION | RESPIRATORY_TRACT | Status: DC | PRN

## 2013-07-24 MED ORDER — IPRATROPIUM-ALBUTEROL 0.5-2.5 (3) MG/3ML IN SOLN
RESPIRATORY_TRACT | Status: AC
  Administered 2013-07-24: 3 mL
  Filled 2013-07-24: qty 3

## 2013-07-24 MED ORDER — ONDANSETRON HCL 4 MG/2ML IJ SOLN
4.0000 mg | Freq: Four times a day (QID) | INTRAMUSCULAR | Status: DC | PRN
Start: 2013-07-24 — End: 2013-07-27
  Administered 2013-07-24: 4 mg via INTRAVENOUS
  Filled 2013-07-24: qty 2

## 2013-07-24 MED ORDER — ASPIRIN 325 MG PO TABS
325.0000 mg | ORAL_TABLET | Freq: Every day | ORAL | Status: DC
Start: 2013-07-24 — End: 2013-07-27
  Administered 2013-07-24 – 2013-07-27 (×4): 325 mg via ORAL
  Filled 2013-07-24 (×4): qty 1

## 2013-07-24 MED ORDER — ACETAMINOPHEN 325 MG PO TABS
650.0000 mg | ORAL_TABLET | Freq: Four times a day (QID) | ORAL | Status: DC | PRN
  Administered 2013-07-25: 650 mg via ORAL
  Filled 2013-07-24: qty 2

## 2013-07-24 MED ORDER — POLYETHYLENE GLYCOL 3350 17 G PO PACK
17.0000 g | PACK | Freq: Every day | ORAL | Status: DC | PRN
  Filled 2013-07-24: qty 1

## 2013-07-24 MED ORDER — NITROGLYCERIN 0.4 MG SL SUBL: 0.4000 mg | SUBLINGUAL_TABLET | SUBLINGUAL | Status: DC | PRN

## 2013-07-24 MED ORDER — IPRATROPIUM BROMIDE 0.02 % IN SOLN: 0.5000 mg | Freq: Four times a day (QID) | RESPIRATORY_TRACT | Status: DC

## 2013-07-24 MED ORDER — ONDANSETRON HCL 4 MG PO TABS: 4.0000 mg | ORAL_TABLET | Freq: Four times a day (QID) | ORAL | Status: DC | PRN

## 2013-07-24 MED ORDER — PANTOPRAZOLE SODIUM 40 MG PO TBEC
40.0000 mg | DELAYED_RELEASE_TABLET | Freq: Every day | ORAL | Status: DC
  Administered 2013-07-25 – 2013-07-27 (×3): 40 mg via ORAL
  Filled 2013-07-24 (×3): qty 1

## 2013-07-24 MED ORDER — IOHEXOL 350 MG/ML SOLN
100.0000 mL | Freq: Once | INTRAVENOUS | Status: AC | PRN
  Administered 2013-07-24: 100 mL via INTRAVENOUS

## 2013-07-24 MED ORDER — ENOXAPARIN SODIUM 40 MG/0.4ML ~~LOC~~ SOLN
40.0000 mg | SUBCUTANEOUS | Status: DC
  Administered 2013-07-24 – 2013-07-26 (×3): 40 mg via SUBCUTANEOUS
  Filled 2013-07-24 (×4): qty 0.4

## 2013-07-24 MED ORDER — IPRATROPIUM-ALBUTEROL 0.5-2.5 (3) MG/3ML IN SOLN
3.0000 mL | Freq: Four times a day (QID) | RESPIRATORY_TRACT | Status: DC
Start: 2013-07-24 — End: 2013-07-24

## 2013-07-24 MED ORDER — AMLODIPINE BESYLATE 5 MG PO TABS
5.0000 mg | ORAL_TABLET | Freq: Every day | ORAL | Status: DC
  Administered 2013-07-26 – 2013-07-27 (×2): 5 mg via ORAL
  Filled 2013-07-24 (×3): qty 1

## 2013-07-24 MED ORDER — ACETAMINOPHEN 500 MG PO TABS
1000.0000 mg | ORAL_TABLET | Freq: Once | ORAL | Status: AC
  Administered 2013-07-24: 1000 mg via ORAL
  Filled 2013-07-24: qty 2

## 2013-07-24 NOTE — ED Notes (Signed)
IV insertion attempted x 2, able to obtain some of the blood work but unable to get IV. Another RN to try IV insertion.

## 2013-07-24 NOTE — ED Provider Notes (Signed)
CSN: [id number]     Arrival date & time 07/24/13  1833 History   First MD Initiated Contact with Patient 07/24/13 1837     Chief Complaint  Patient presents with  . Chest Pain  . Shortness of Breath  . Fever     (Consider location/radiation/quality/duration/timing/severity/associated sxs/prior Treatment) The history is provided by the patient.  [name] [name] is a 80 y.o. female hx of anxiety, COPD, HTN, recurrent pneumonia and UTI here with fever, weakness, cough. She was recently admitted for hospital-acquired pneumonia and was discharged several days ago on ceftin. She had some intermittent dull chest pain for the last week. She was doing well yesterday was walking around but today felt weak. She also had fever since yesterday and was 101 by EMS. They also noted that she was 84% on room air and usually is only on nasal cannula at night. She also has been coughing more. He had multiple admissions for UTI and pneumonia.   Past Medical History  Diagnosis Date  . ANXIETY 08/16/2008  . CAD, UNSPECIFIED SITE 10/17/2008  . CHOLELITHIASIS 08/23/2008  . COPD 01/04/2010    FeV1 101%, DLCO64% 2008  . 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 DIVERTICULOSIS-COLON 04/07/2008  . GERD 04/19/2007  . HYPERSOMNIA, ASSOCIATED WITH SLEEP APNEA 02/01/2007  . HYPERTENSION 11/12/2006  . MITRAL VALVE PROLAPSE, HX OF 10/07/2008  . NEOPLASM, MALIGNANT, BREAST, RIGHT 02/15/2008  . OSTEOPOROSIS 04/04/2008  . Raynaud's syndrome 04/25/2009  . Airway obstruction   . History of diverticulitis of colon   . Tortuous colon   . Anal stricture   . Chronic constipation   . Fatty liver   . Hiatal hernia   . Esophageal stricture   . IBS (irritable bowel syndrome)   . Pulmonary fibrosis   . Sleep apnea   . COPD (chronic obstructive pulmonary disease)   . Colitis     sigmoid  . Candida esophagitis   . Diverticulitis   . Hearing aid worn     bilateral  . Atrial fibrillation     she denies known hx of afib 02/24/13   . Complication of anesthesia     difficult intubation   Past Surgical History  Procedure Laterality Date  . Abdominal hysterectomy    . Bilateral salpingoophorectomy      s/p prolapsed bladder  . Bladder surgery      prolapsed  . Thoracotomy      granuloma, pulmonary-thoracotomy  . Breast surgery  07-21-07    mastectomy (right), all margins neg and LNs neg   . Appendectomy    . Tonsillectomy    . Rotator cuff repair    . Mastectomy      right  . Breast cyst excision      left  . Mouth surgery    . Eye surgery      cataract removal bilateral  . Melanoma excision      rt clavicle Dr Rhoderick [facility] office)  . Lung surgery     Family History  Problem Relation Age of Onset  . Parkinsonism Father   . Coronary artery disease Father   . Breast cancer      aunt  . Colon cancer      aunt, age 30  . Uterine cancer      aunt  . Stroke Mother   . Heart failure Mother   . Cancer Maternal Aunt     breast   History  Substance Use Topics  . Smoking status: Former Smoker --  2.00 packs/day for 30 years    Types: Cigarettes    Quit date: 05/13/1983  . Smokeless tobacco: Never Used  . Alcohol Use: Yes     Comment: 1-2 glaases wine per day   OB History   Grav Para Term Preterm Abortions TAB SAB Ect Mult Living                 Review of Systems  Constitutional: Positive for fever.  Respiratory: Positive for cough.   Cardiovascular: Positive for chest pain.  Gastrointestinal: Positive for diarrhea.  Neurological: Positive for weakness.  All other systems reviewed and are negative.      Allergies  Azithromycin; Ciprofloxacin; Levaquin; Penicillins; Prednisone; and Sulfamethoxazole  Home Medications   Current Outpatient Rx  Name  Route  Sig  Dispense  Refill  . acetaminophen (TYLENOL) 325 MG tablet   Oral   Take 650 mg by mouth every 6 (six) hours as needed for mild pain.         Marland Kitchen amLODipine (NORVASC) 5 MG tablet   Oral   Take 1 tablet (5 mg total) by mouth  daily.   90 tablet   3   . aspirin 325 MG tablet   Oral   Take 325 mg by mouth daily.         . cefUROXime (CEFTIN) 250 MG tablet   Oral   Take 1 tablet (250 mg total) by mouth 2 (two) times daily with a meal.   10 tablet   0     Patient has used keflex before without any reactio ...   . diazepam (VALIUM) 5 MG tablet   Oral   Take 1 tablet (5 mg total) by mouth at bedtime.   30 tablet   3   . hydrOXYzine (ATARAX/VISTARIL) 10 MG tablet   Oral   Take 10 mg by mouth every 4 (four) hours as needed for itching.         . nitrofurantoin, macrocrystal-monohydrate, (MACROBID) 100 MG capsule   Oral   Take 1 capsule (100 mg total) by mouth 2 (two) times daily. For 5 days   10 capsule   0   . omeprazole (PRILOSEC) 20 MG capsule   Oral   Take 20 mg by mouth daily.         . polyethylene glycol (MIRALAX / GLYCOLAX) packet   Oral   Take 17 g by mouth daily as needed (for constipation).         . AMBULATORY NON FORMULARY MEDICATION      Oxygen at bedtime-2 or 2 1/2 liters         . nitroGLYCERIN (NITROSTAT) 0.4 MG SL tablet   Sublingual   Place 0.4 mg under the tongue every 5 (five) minutes as needed for chest pain.          BP 116/46  Pulse 100  Temp(Src) 100.9 F (38.3 C) (Rectal)  Resp 20  SpO2 94% Physical Exam  Nursing note and vitals reviewed. Constitutional:  Chronically ill, ill appearing   HENT:  Head: Normocephalic.  MM slightly dry   Eyes: Conjunctivae are normal. Pupils are equal, round, and reactive to light.  Neck: Normal range of motion. Neck supple.  Cardiovascular: Regular rhythm and normal heart sounds.   Mildly tachycardic   Pulmonary/Chest:  Slightly tachypneic, dec breath sounds L base   Abdominal: Soft. Bowel sounds are normal. She exhibits no distension. There is no tenderness. There is no rebound and no guarding.  Musculoskeletal: Normal range of motion. She exhibits no edema and no tenderness.  Neurological: She is alert.   Tired, strength 4/5 throughout   Skin: Skin is warm and dry.  Psychiatric: She has a normal mood and affect. Her behavior is normal. Judgment and thought content normal.    ED Course  Procedures (including critical care time) Labs Review Labs Reviewed  CBC WITH DIFFERENTIAL - Abnormal; Notable for the following:    WBC 22.7 (*)    Neutrophils Relative % 92 (*)    Neutro Abs 20.9 (*)    Lymphocytes Relative 4 (*)    All other components within normal limits  COMPREHENSIVE METABOLIC PANEL - Abnormal; Notable for the following:    Sodium 135 (*)    Potassium 3.3 (*)    Chloride 95 (*)    Glucose, Bld 139 (*)    Albumin 3.1 (*)    GFR calc non Af Amer 77 (*)    GFR calc Af Amer 89 (*)    All other components within normal limits  URINALYSIS, ROUTINE W REFLEX MICROSCOPIC - Abnormal; Notable for the following:    Leukocytes, UA SMALL (*)    All other components within normal limits  URINE MICROSCOPIC-ADD ON - Abnormal; Notable for the following:    Squamous Epithelial / LPF MANY (*)    All other components within normal limits  CULTURE, BLOOD (ROUTINE X 2)  CULTURE, BLOOD (ROUTINE X 2)  URINE CULTURE  I-STAT TROPOININ, ED  I-STAT CG4 LACTIC ACID, ED   Imaging Review Dg Chest 2 View  07/24/2013   CLINICAL DATA:  Hypoxia.  Shortness of breath.  EXAM: CHEST  2 VIEW  COMPARISON:  DG CHEST 2 VIEW dated 07/16/2013  FINDINGS: Mediastinum and hilar structures are normal. Cardiomegaly noted. Bilateral pulmonary interstitial prominence noted. Congestive heart failure interstitial edema could present in this fashion. Pneumonitis cannot be excluded. Pleural parenchymal scarring again noted on the left. Left pleural effusion noted. No pneumothorax. Surgical clips right axilla. Degenerative changes and scoliosis thoracic spine.  IMPRESSION: Cardiomegaly with bilateral interstitial prominence and left pleural effusion. Congestive heart failure from interstitial edema could present this fashion.  Pneumonitis cannot be excluded   Electronically Signed   By: Marcello Moores  Register   On: 07/24/2013 19:49     EKG Interpretation   Date/Time:  Sunday July 24 2013 18:39:11 EDT Ventricular Rate:  79 PR Interval:  188 QRS Duration: 102 QT Interval:  399 QTC Calculation: 457 R Axis:   97 Text Interpretation:  Sinus rhythm Atrial premature complexes Low voltage,  extremity and precordial leads No significant change since last tracing  Confirmed by Demosthenes Virnig  MD, Shritha Bresee ([id number]) on 07/24/2013 6:50:15 PM      MDM   Final diagnoses:  None   Juliea Eppinger is a 80 y.o. female here with chest pressure, weakness, fever. Concerned for worsening pneumonia. Will do sepsis workup and will likely need admission.   8:11 PM CXR showed possible pneumonia. Given vanc/aztreonam. WBC 22. UA unremarkable. Will admit to tele for recurrent HCAP pneumonia, chest pain.    [name] Arthurs, MD 07/24/13 2012

## 2013-07-24 NOTE — ED Notes (Signed)
2 unsuccessful IV attempts.

## 2013-07-24 NOTE — ED Notes (Signed)
Per EMS: Pt started having dull chest pain, has been intermittent for 1 wk. Pain from left to right chest. With palpation, pain moves up to mandibular notch. Pt also c/o weakness. Lung sounds clear. No SOB, no N/V. Diarrhea x 1 yesterday. Pt usually on 2.5L [place] at night only. Today pt was 84% on RA. Pt at 92% on 3L [place]. Temp of 101.3. CBG 167. Family has noticed decrease in activity level x 3 days, progressively worsening. Pt A&O x 4. Pt ambulatory at baseline.

## 2013-07-24 NOTE — H&P (Addendum)
[facility] History and Physical  [name] [name] [id number] DOB: 1924/01/25 DOA: 07/24/2013  Referring physician: ER physician. PCP: [name] Hurter, MD  Specialists: Dr. Joya Gaskins. Pulmonologist.  Chief Complaint: Weakness and shortness of breath.  HPI: [name] [name] is a 80 y.o. female history of COPD, hypertension who was recently admitted to the hospital twice over the last 2 weeks for fever, first time for possible pneumonia and second time for UTI was brought to the ER after patient was found to be increasingly weak with chest tightness. As per patient's daughter patient did fine after being discharged last week. Last Friday 2 days ago patient started becoming increasingly drowsy weak and getting easily short of breath. Patient also has been had some cough with productive sputum. Since patient is feeling increasingly weak patient was brought to the ER. In the ER patient was found to have leukocytosis and was febrile. Chest x-ray showing possible left-sided pneumonia. Blood cultures have been obtained and patient has been empirically started on antibiotics for possible patch. Patient states that she has had 3 bowel movements yesterday but was not loose. She has had nausea all these days but no vomiting. Denies any abdominal pain headache focal deficits loss of consciousness. She's been increasingly drowsy but is oriented to time place and person and follows commands.   Review of Systems: As presented in the history of presenting illness, rest negative.  Past Medical History  Diagnosis Date  . ANXIETY 08/16/2008  . CAD, UNSPECIFIED SITE 10/17/2008  . CHOLELITHIASIS 08/23/2008  . COPD 01/04/2010    FeV1 101%, DLCO64% 2008  . 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 DIVERTICULOSIS-COLON 04/07/2008  . GERD 04/19/2007  . HYPERSOMNIA, ASSOCIATED WITH SLEEP APNEA 02/01/2007  . HYPERTENSION 11/12/2006  . MITRAL VALVE PROLAPSE, HX OF 10/07/2008  . NEOPLASM, MALIGNANT, BREAST, RIGHT 02/15/2008   . OSTEOPOROSIS 04/04/2008  . Raynaud's syndrome 04/25/2009  . Airway obstruction   . History of diverticulitis of colon   . Tortuous colon   . Anal stricture   . Chronic constipation   . Fatty liver   . Hiatal hernia   . Esophageal stricture   . IBS (irritable bowel syndrome)   . Pulmonary fibrosis   . Sleep apnea   . COPD (chronic obstructive pulmonary disease)   . Colitis     sigmoid  . Candida esophagitis   . Diverticulitis   . Hearing aid worn     bilateral  . Atrial fibrillation     she denies known hx of afib 02/24/13  . Complication of anesthesia     difficult intubation   Past Surgical History  Procedure Laterality Date  . Abdominal hysterectomy    . Bilateral salpingoophorectomy      s/p prolapsed bladder  . Bladder surgery      prolapsed  . Thoracotomy      granuloma, pulmonary-thoracotomy  . Breast surgery  07-21-07    mastectomy (right), all margins neg and LNs neg   . Appendectomy    . Tonsillectomy    . Rotator cuff repair    . Mastectomy      right  . Breast cyst excision      left  . Mouth surgery    . Eye surgery      cataract removal bilateral  . Melanoma excision      rt clavicle Dr [facility]oderick [facility] office)  . Lung surgery     Social History:  reports that she quit smoking about 30 years ago. Her smoking use  included Cigarettes. She has a 60 pack-year smoking history. She has never used smokeless tobacco. She reports that she drinks alcohol. She reports that she does not use illicit drugs. Where does patient live assisted living facility. Can patient participate in ADLs? Yes.  Allergies  Allergen Reactions  . Azithromycin Hives, Diarrhea, Dermatitis and Rash  . Ciprofloxacin Swelling  . Levaquin [Levofloxacin] Hives  . Penicillins Hives    has had mild doses that she did not have reactions to  . Prednisone     Unknown   . Sulfamethoxazole Nausea And Vomiting    Family History:  Family History  Problem Relation Age of Onset  .  Parkinsonism Father   . Coronary artery disease Father   . Breast cancer      aunt  . Colon cancer      aunt, age 75  . Uterine cancer      aunt  . Stroke Mother   . Heart failure Mother   . Cancer Maternal Aunt     breast      Prior to Admission medications   Medication Sig Start Date End Date Taking? Authorizing Provider  acetaminophen (TYLENOL) 325 MG tablet Take 650 mg by mouth every 6 (six) hours as needed for mild pain.   Yes Historical Provider, MD  amLODipine (NORVASC) 5 MG tablet Take 1 tablet (5 mg total) by mouth daily. 07/12/13  Yes Lisabeth Pick, MD  aspirin 325 MG tablet Take 325 mg by mouth daily.   Yes Historical Provider, MD  cefUROXime (CEFTIN) 250 MG tablet Take 1 tablet (250 mg total) by mouth 2 (two) times daily with a meal. 07/15/13  Yes Hosie Poisson, MD  diazepam (VALIUM) 5 MG tablet Take 1 tablet (5 mg total) by mouth at bedtime. 07/12/13  Yes Lisabeth Pick, MD  hydrOXYzine (ATARAX/VISTARIL) 10 MG tablet Take 10 mg by mouth every 4 (four) hours as needed for itching.   Yes Historical Provider, MD  nitrofurantoin, macrocrystal-monohydrate, (MACROBID) 100 MG capsule Take 1 capsule (100 mg total) by mouth 2 (two) times daily. For 5 days 07/20/13  Yes Lisabeth Pick, MD  omeprazole (PRILOSEC) 20 MG capsule Take 20 mg by mouth daily.   Yes Historical Provider, MD  polyethylene glycol (MIRALAX / GLYCOLAX) packet Take 17 g by mouth daily as needed (for constipation).   Yes Historical Provider, MD  AMBULATORY NON FORMULARY MEDICATION Oxygen at bedtime-2 or 2 1/2 liters    Historical Provider, MD  nitroGLYCERIN (NITROSTAT) 0.4 MG SL tablet Place 0.4 mg under the tongue every 5 (five) minutes as needed for chest pain.    Historical Provider, MD    Physical Exam: Filed Vitals:   07/24/13 1835 07/24/13 1857 07/24/13 1959 07/24/13 2055  BP: 116/46     Pulse: 100     Temp: 100.1 F (37.8 C) 100.9 F (38.3 C)    TempSrc: Oral Rectal    Resp: 20     Height:    5' 1.02" (1.55  m)  Weight:    54.8 kg (120 lb 13 oz)  SpO2: 92%  94%      General:  Well-developed and moderately nourished.  Eyes: Anicteric no pallor.  ENT: No discharge from the ears eyes nose mouth.  Neck: No mass felt. JVD not appreciated.  Cardiovascular: S1-S2 heard.  Respiratory: No rhonchi or crepitations.  Abdomen: Soft nontender bowel sounds present no guarding or rigidity.  Skin: No rash.  Musculoskeletal: No edema.  Psychiatric: Patient is  drowsy but awake and oriented to time place and person.  Neurologic: Moves all extremities.  Labs on Admission:  Basic Metabolic Panel:  Recent Labs Lab 07/24/13 1910 07/24/13 2025  NA 135* 139  K 3.3* 3.0*  CL 95* 98  CO2 27  --   GLUCOSE 139* 104*  BUN 19 17  CREATININE 0.66 0.70  CALCIUM 9.2  --    Liver Function Tests:  Recent Labs Lab 07/24/13 1910  AST 17  ALT 8  ALKPHOS 64  BILITOT 0.5  PROT 6.5  ALBUMIN 3.1*   No results found for this basename: LIPASE, AMYLASE,  in the last 168 hours No results found for this basename: AMMONIA,  in the last 168 hours CBC:  Recent Labs Lab 07/24/13 1910 07/24/13 2025  WBC 22.7*  --   NEUTROABS 20.9*  --   HGB 12.6 12.6  HCT 37.4 37.0  MCV 87.4  --   PLT 245  --    Cardiac Enzymes: No results found for this basename: CKTOTAL, CKMB, CKMBINDEX, TROPONINI,  in the last 168 hours  BNP (last 3 results)  Recent Labs  07/13/13 1454  PROBNP 489.0*   CBG:  Recent Labs Lab 07/18/13 0752  GLUCAP 97    Radiological Exams on Admission: Dg Chest 2 View  07/24/2013   CLINICAL DATA:  Hypoxia.  Shortness of breath.  EXAM: CHEST  2 VIEW  COMPARISON:  DG CHEST 2 VIEW dated 07/16/2013  FINDINGS: Mediastinum and hilar structures are normal. Cardiomegaly noted. Bilateral pulmonary interstitial prominence noted. Congestive heart failure interstitial edema could present in this fashion. Pneumonitis cannot be excluded. Pleural parenchymal scarring again noted on the left. Left  pleural effusion noted. No pneumothorax. Surgical clips right axilla. Degenerative changes and scoliosis thoracic spine.  IMPRESSION: Cardiomegaly with bilateral interstitial prominence and left pleural effusion. Congestive heart failure from interstitial edema could present this fashion. Pneumonitis cannot be excluded   Electronically Signed   By: Marcello Moores  Register   On: 07/24/2013 19:49    EKG: Independently reviewed. Sinus rhythm with atrial premature complexes.  Assessment/Plan Principal Problem:   SIRS (systemic inflammatory response syndrome) Active Problems:   HYPERTENSION   COPD with emphysema, gold stage A   GERD   Leukocytosis, unspecified   Pneumonia   1. SIRS - source not clear could be pneumonic process. Since patient has been admitted twice in the last 2 weeks with fever I have ordered CAT scan of the chest and abdomen to look for any definite source. Check for influenza and C. difficile. Follow blood cultures. Presently patient is placed empirically on vancomycin and Azactam for health care associated pneumonia. Gently hydrate and observe on telemetry overnight. Check troponin and BNP for chest tightness. 2. Drowsiness - patient is nonfocal. I am holding off patient's hydroxyzine and have made diazepam when necessary instead of scheduled dose. If patient doesn't is worsens we'll get ABG to check for CO2 retention. Closely observe. 3. Hypertension - continue present medications. 4. COPD - presently not wheezing. Patient has been placed on nebulizers. 5. Leukocytosis - see #1.  I have reviewed patient's old charts and labs and personally reviewed chest x-ray.  Code Status: Full code.  Family Communication: Patient's daughter.  Disposition Plan: Admit to inpatient.    KAKRAKANDY,ARSHAD N. [facility] Pager [phone].  If 7PM-7AM, please contact night-coverage [email or web address] Password [facility] 07/24/2013, 8:59 PM

## 2013-07-24 NOTE — Progress Notes (Signed)
ANTIBIOTIC CONSULT NOTE - INITIAL  Pharmacy Consult for Azactam/Vancomcyin  Indication: Recurrent HCAP  Allergies  Allergen Reactions  . Azithromycin Hives, Diarrhea, Dermatitis and Rash  . Ciprofloxacin Swelling  . Levaquin [Levofloxacin] Hives  . Penicillins Hives    has had mild doses that she did not have reactions to  . Prednisone     Unknown   . Sulfamethoxazole Nausea And Vomiting    Patient Measurements:    Vital Signs: Temp: 100.9 F (38.3 C) (03/15 1857) Temp src: Rectal (03/15 1857) BP: 116/46 mmHg (03/15 1835) Pulse Rate: 100 (03/15 1835) Intake/Output from previous day:   Intake/Output from this shift:    Labs:  Recent Labs  07/24/13 1910 07/24/13 2025  WBC 22.7*  --   HGB 12.6 12.6  PLT 245  --   CREATININE 0.66 0.70   The CrCl is unknown because both a height and weight (above a minimum accepted value) are required for this calculation. No results found for this basename: VANCOTROUGH, VANCOPEAK, VANCORANDOM, [place], GENTPEAK, GENTRANDOM, TOBRATROUGH, TOBRAPEAK, TOBRARND, AMIKACINPEAK, AMIKACINTROU, AMIKACIN,  in the last 72 hours   Microbiology: Recent Results (from the past 720 hour(s))  URINE CULTURE     Status: None   Collection Time    07/12/13  9:37 AM      Result Value Ref Range Status   Culture KLEBSIELLA PNEUMONIAE   Final   Colony Count >=100,000 COLONIES/ML   Final   Organism ID, Bacteria KLEBSIELLA PNEUMONIAE   Final  CULTURE, BLOOD (ROUTINE X 2)     Status: None   Collection Time    07/13/13  2:54 PM      Result Value Ref Range Status   Specimen Description BLOOD LEFT ARM   Final   Special Requests BOTTLES DRAWN AEROBIC AND ANAEROBIC 5ML   Final   Culture  Setup Time     Final   Value: 07/13/2013 20:58     Performed at [facility]   Culture     Final   Value: NO GROWTH 5 DAYS     Performed at [facility]   Report Status 07/19/2013 FINAL   Final  CULTURE, BLOOD (ROUTINE X 2)     Status: None    Collection Time    07/13/13  2:54 PM      Result Value Ref Range Status   Specimen Description BLOOD LEFT ARM   Final   Special Requests BOTTLES DRAWN AEROBIC AND ANAEROBIC 5ML   Final   Culture  Setup Time     Final   Value: 07/13/2013 20:58     Performed at [facility]   Culture     Final   Value: NO GROWTH 5 DAYS     Performed at [facility]   Report Status 07/19/2013 FINAL   Final  URINE CULTURE     Status: None   Collection Time    07/13/13  3:52 PM      Result Value Ref Range Status   Specimen Description URINE, CLEAN CATCH   Final   Special Requests NONE   Final   Culture  Setup Time     Final   Value: 07/14/2013 03:00     Performed at [place]     Final   Value: >=100,000 COLONIES/ML     Performed at [facility]   Culture     Final   Value: KLEBSIELLA PNEUMONIAE     Performed at  [facility]   Report Status 07/15/2013 FINAL   Final   Organism ID, Bacteria KLEBSIELLA PNEUMONIAE   Final  CULTURE, BLOOD (ROUTINE X 2)     Status: None   Collection Time    07/16/13 11:57 AM      Result Value Ref Range Status   Specimen Description BLOOD LEFT ARM   Final   Special Requests BOTTLES DRAWN AEROBIC AND ANAEROBIC [facility] EACH   Final   Culture  Setup Time     Final   Value: 07/16/2013 19:21     Performed at [facility]   Culture     Final   Value: NO GROWTH 5 DAYS     Performed at [facility]   Report Status 07/22/2013 FINAL   Final  CULTURE, BLOOD (ROUTINE X 2)     Status: None   Collection Time    07/16/13 12:02 PM      Result Value Ref Range Status   Specimen Description BLOOD LEFT WRIST   Final   Special Requests BOTTLES DRAWN AEROBIC AND ANAEROBIC [facility] EACH   Final   Culture  Setup Time     Final   Value: 07/16/2013 19:21     Performed at [facility]   Culture     Final   Value: NO GROWTH 5 DAYS     Performed at [facility]   Report Status 07/22/2013 FINAL   Final   URINE CULTURE     Status: None   Collection Time    07/16/13 11:03 PM      Result Value Ref Range Status   Specimen Description URINE, RANDOM   Final   Special Requests NONE   Final   Culture  Setup Time     Final   Value: 07/17/2013 17:28     Performed at [place]     Final   Value: NO GROWTH     Performed at [facility]   Culture     Final   Value: NO GROWTH     Performed at [facility]   Report Status 07/18/2013 FINAL   Final    Medical History: Past Medical History  Diagnosis Date  . ANXIETY 08/16/2008  . CAD, UNSPECIFIED SITE 10/17/2008  . CHOLELITHIASIS 08/23/2008  . COPD 01/04/2010    FeV1 101%, DLCO64% 2008  . 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 DIVERTICULOSIS-COLON 04/07/2008  . GERD 04/19/2007  . HYPERSOMNIA, ASSOCIATED WITH SLEEP APNEA 02/01/2007  . HYPERTENSION 11/12/2006  . MITRAL VALVE PROLAPSE, HX OF 10/07/2008  . NEOPLASM, MALIGNANT, BREAST, RIGHT 02/15/2008  . OSTEOPOROSIS 04/04/2008  . Raynaud's syndrome 04/25/2009  . Airway obstruction   . History of diverticulitis of colon   . Tortuous colon   . Anal stricture   . Chronic constipation   . Fatty liver   . Hiatal hernia   . Esophageal stricture   . IBS (irritable bowel syndrome)   . Pulmonary fibrosis   . Sleep apnea   . COPD (chronic obstructive pulmonary disease)   . Colitis     sigmoid  . Candida esophagitis   . Diverticulitis   . Hearing aid worn     bilateral  . Atrial fibrillation     she denies known hx of afib 02/24/13  . Complication of anesthesia     difficult intubation    Medications:  Scheduled:   Infusions:  . aztreonam    .  vancomycin 1,000 mg (07/24/13 2017)   PRN:  Assessment:  80 yo F with recurrent HCAP and Hx of UTI, starting broad spectrum antibiotics with Vancomycin and Azactam.   She was recently admitted for HCAP and was discharged several days ago on ceftin.    Pt also has a history of UTIs (E.coli,  Psuedomonas, Klebsiella - See previous cultures in [facility])   WBC elevated 22.7  Scr WNL 0.7 with estimated CrCl   Temp 100.9   3/15 >> Vancomycin >>  3/15 >> Azactam >>   Micro 3/15: Blood x 2: Sent   Goal of Therapy:  Vancomycin trough level 15-20 mcg/ml Azactam per renal function   Plan:  1.) Vancomycin 750 mg IV q24h 2.) Azactam 1 gram IV q8h   [name] [name], Gaye Alken PharmD Pager #: [phone] 8:52 PM 07/24/2013

## 2013-07-24 NOTE — ED Notes (Signed)
Bed: WA07 Expected date: 07/24/13 Expected time: 6:27 PM Means of arrival:  Comments: Fever and lethargic

## 2013-07-25 ENCOUNTER — Telehealth: Payer: Self-pay | Admitting: Internal Medicine

## 2013-07-25 DIAGNOSIS — J189 Pneumonia, unspecified organism: Secondary | ICD-10-CM | POA: Diagnosis not present

## 2013-07-25 LAB — COMPREHENSIVE METABOLIC PANEL
ALK PHOS: 46 U/L (ref 39–117)
ALT: 7 U/L (ref 0–35)
AST: 12 U/L (ref 0–37)
Albumin: 2.4 g/dL — ABNORMAL LOW (ref 3.5–5.2)
BUN: 14 mg/dL (ref 6–23)
CO2: 26 meq/L (ref 19–32)
Calcium: 7.5 mg/dL — ABNORMAL LOW (ref 8.4–10.5)
Chloride: 104 mEq/L (ref 96–112)
Creatinine, Ser: 0.59 mg/dL (ref 0.50–1.10)
GFR calc non Af Amer: 80 mL/min — ABNORMAL LOW (ref >90)
GLUCOSE: 106 mg/dL — AB (ref 70–99)
POTASSIUM: 3.1 meq/L — AB (ref 3.7–5.3)
SODIUM: 139 meq/L (ref 137–147)
Total Bilirubin: 0.4 mg/dL (ref 0.3–1.2)
Total Protein: 5 g/dL — ABNORMAL LOW (ref 6.0–8.3)

## 2013-07-25 LAB — INFLUENZA PANEL BY PCR (TYPE A & B)
H1N1 flu by pcr: NOT DETECTED
INFLBPCR: NEGATIVE
Influenza A By PCR: NEGATIVE

## 2013-07-25 LAB — CBC WITH DIFFERENTIAL/PLATELET
BASOS PCT: 0 % (ref 0–1)
Basophils Absolute: 0 10*3/uL (ref 0.0–0.1)
EOS PCT: 1 % (ref 0–5)
Eosinophils Absolute: 0.2 10*3/uL (ref 0.0–0.7)
HEMATOCRIT: 34.7 % — AB (ref 36.0–46.0)
Hemoglobin: 11.3 g/dL — ABNORMAL LOW (ref 12.0–15.0)
Lymphocytes Relative: 7 % — ABNORMAL LOW (ref 12–46)
Lymphs Abs: 1.2 10*3/uL (ref 0.7–4.0)
MCH: 28.7 pg (ref 26.0–34.0)
MCHC: 32.6 g/dL (ref 30.0–36.0)
MCV: 88.1 fL (ref 78.0–100.0)
MONO ABS: 0.7 10*3/uL (ref 0.1–1.0)
Monocytes Relative: 4 % (ref 3–12)
NEUTROS ABS: 14.5 10*3/uL — AB (ref 1.7–7.7)
Neutrophils Relative %: 87 % — ABNORMAL HIGH (ref 43–77)
Platelets: 242 10*3/uL (ref 150–400)
RBC: 3.94 MIL/uL (ref 3.87–5.11)
RDW: 14.2 % (ref 11.5–15.5)
WBC: 16.6 10*3/uL — ABNORMAL HIGH (ref 4.0–10.5)

## 2013-07-25 LAB — URINE CULTURE
COLONY COUNT: NO GROWTH
CULTURE: NO GROWTH

## 2013-07-25 MED ORDER — ENSURE COMPLETE PO LIQD: 237.0000 mL | Freq: Two times a day (BID) | ORAL | Status: DC | PRN

## 2013-07-25 MED ORDER — POTASSIUM CHLORIDE CRYS ER 20 MEQ PO TBCR
40.0000 meq | EXTENDED_RELEASE_TABLET | Freq: Once | ORAL | Status: AC
  Administered 2013-07-25: 40 meq via ORAL
  Filled 2013-07-25: qty 2

## 2013-07-25 NOTE — Evaluation (Signed)
Physical Therapy Evaluation Patient Details Name: [name] [name] MRN: [id number] DOB: 1924/02/28 Today's Date: 07/25/2013 Time: 3419-3790 PT Time Calculation (min): 13 min  PT Assessment / Plan / Recommendation History of Present Illness  80 yo female admitted with SIRS, weakness, chest pain, SOB. Hx of HTN, COPD-O2 at night. Multiple recent admisisons and discharges within last week or so. [facility] from [facility]  Clinical Impression  On eval, pt required Min assist for mobility-able to ambulate ~15 feet in room with walker. Pt appears very fatigued. Pt has had multiple admissions recently. At this time, recommend SNF unless pt's mobility improves and/or she can arrange 24 hour care.     PT Assessment  Patient needs continued PT services    Follow Up Recommendations  SNF;Supervision/Assistance - 24 hour (unless pt's mobility improves and pt can have 24 hour supervision/assist at discharge)    Does the patient have the potential to tolerate intense rehabilitation      Barriers to Discharge        Equipment Recommendations  None recommended by PT    Recommendations for Other Services OT consult   Frequency Min 3X/week    Precautions / Restrictions Precautions Precautions: Fall Restrictions Weight Bearing Restrictions: No   Pertinent Vitals/Pain No c/o pain      Mobility  Bed Mobility Overal bed mobility: Needs Assistance Bed Mobility: Supine to Sit General bed mobility comments: Increased time. Assist for trunk to full upright.  Transfers Overall transfer level: Needs assistance Equipment used: Rolling walker (2 wheeled) Transfers: Sit to/from Stand Sit to Stand: Min assist General transfer comment: Assist to rise, stabilize, control descent Ambulation/Gait Ambulation/Gait assistance: Min assist Ambulation Distance (Feet): 15 Feet Assistive device: Rolling walker (2 wheeled) Gait Pattern/deviations: Decreased stride length;Step-through pattern General Gait Details: slow  gait speed. Remained on 4L O2 throughout session.     Exercises     PT Diagnosis: Difficulty walking;Generalized weakness  PT Problem List: Decreased strength;Decreased activity tolerance;Decreased mobility;Decreased balance PT Treatment Interventions: DME instruction;Gait training;Functional mobility training;Therapeutic activities;Therapeutic exercise;Patient/family education;Balance training     PT Goals(Current goals can be found in the care plan section) Acute Rehab PT Goals Patient Stated Goal: get back to apartment PT Goal Formulation: With patient Time For Goal Achievement: 08/08/13 Potential to Achieve Goals: Good  Visit Information  Last PT Received On: 07/25/13 Assistance Needed: +1 History of Present Illness: 80 yo female admitted with SIRS, weakness, chest pain, SOB. Hx of HTN, COPD-O2 at night. Multiple recent admisisons and discharges within last week or so. Pt is from [place] expects to be discharged to:: Unsure Living Arrangements: Alone Type of Home: Independent living facility (from [facility]) Home Access: Level entry Home Layout: One [place]: Walker - 4 wheels (oxygen) Additional Comments: from [employer]; low commode; walk in shower no seat nor grab bars Prior Function Level of Independence: Independent with assistive device(s) Comments: pt now has a scooter- but does not use yet. stilll using walker Communication Communication: HOH    Cognition  Cognition Arousal/Alertness: Awake/alert Behavior During Therapy: WFL for tasks assessed/performed Overall Cognitive Status: Within Functional Limits for tasks assessed    Extremity/Trunk Assessment Upper Extremity Assessment Upper Extremity Assessment: Generalized weakness Lower Extremity Assessment Lower Extremity Assessment: Generalized weakness Cervical / Trunk Assessment Cervical / Trunk Assessment: Kyphotic   Balance     End of Session PT - End of Session Equipment Utilized During Treatment: Oxygen Activity  Tolerance: Patient limited by fatigue Patient left: in chair;with call bell/phone within reach;with nursing/sitter in room  GP     [name], MPT Pager: [phone]

## 2013-07-25 NOTE — Progress Notes (Signed)
Discontinued droplet isolation per infection control.

## 2013-07-25 NOTE — Telephone Encounter (Signed)
[facility] Triage Call Report Triage Record Num: [id number] Operator: Janit Pagan Patient Name: [name] [name] Call Date & Time: 07/24/2013 5:39:06PM Patient Phone: [phone] PCP: Darrick Penna. Swords Patient Gender: Female PCP Fax : [phone] Patient DOB: 08-Aug-1923 Practice Name: [facility] Reason for Call: Caller: Cathy/Other; PCP: Phoebe Sharps (Adults only); CB#: [phone]; Call regarding Chest Pain/Chest Discomfort; Has been in hospital twice in the last 2 weeks, treated for pneumonia and UTI - home on Mon 3/9 and feeling better Mon 3/9, Tues 3/10, Wed 3/11. Still on antibiotics for UTI. Has become very ill since Friday 3/13, has become very weak, barely able to walk with walker and assistance. Oxygen saturation 94 on oxygen, will drop to 84% if off to go to bathroom. Seen by Home Health nurse at lunchtime today 3/15 and he noted slurred speech. Not eating, has been c/o chest pain i ntermittently today with SOB. Triaged in Chest Pain Guideline - Activate EMS 911 due to pressure, fullness, pain anywhreee in chest lasting 5 or more minutes now or within the last hour. Will call 911 now. Protocol(s) Used: Chest Pain Recommended Outcome per Protocol: Activate EMS 911 Reason for Outcome: Pressure, fullness, squeezing sensation or pain anywhere in the chest lasting 5 or more minutes now or within the last hour. Pain is NOT associated with taking a deep breath or a productive cough, movement, or touch to a localized area on the chest. Care Advice: ~ 03/

## 2013-07-25 NOTE — Telephone Encounter (Signed)
fyi pt in hospital

## 2013-07-25 NOTE — Evaluation (Signed)
Clinical/Bedside Swallow Evaluation Patient Details  Name: [name] [name] MRN: [id number] Date of Birth: 28-Feb-1924  Today's Date: 07/25/2013 Time: 1203-1236 SLP Time Calculation (min): 33 min  Past Medical History:  Past Medical History  Diagnosis Date  . ANXIETY 08/16/2008  . CAD, UNSPECIFIED SITE 10/17/2008  . CHOLELITHIASIS 08/23/2008  . COPD 01/04/2010    FeV1 101%, DLCO64% 2008  . 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 DIVERTICULOSIS-COLON 04/07/2008  . GERD 04/19/2007  . HYPERSOMNIA, ASSOCIATED WITH SLEEP APNEA 02/01/2007  . HYPERTENSION 11/12/2006  . MITRAL VALVE PROLAPSE, HX OF 10/07/2008  . NEOPLASM, MALIGNANT, BREAST, RIGHT 02/15/2008  . OSTEOPOROSIS 04/04/2008  . Raynaud's syndrome 04/25/2009  . Airway obstruction   . History of diverticulitis of colon   . Tortuous colon   . Anal stricture   . Chronic constipation   . Fatty liver   . Hiatal hernia   . Esophageal stricture   . IBS (irritable bowel syndrome)   . Pulmonary fibrosis   . Sleep apnea   . COPD (chronic obstructive pulmonary disease)   . Colitis     sigmoid  . Candida esophagitis   . Diverticulitis   . Hearing aid worn     bilateral  . Atrial fibrillation     she denies known hx of afib 02/24/13  . Complication of anesthesia     difficult intubation   Past Surgical History:  Past Surgical History  Procedure Laterality Date  . Abdominal hysterectomy    . Bilateral salpingoophorectomy      s/p prolapsed bladder  . Bladder surgery      prolapsed  . Thoracotomy      granuloma, pulmonary-thoracotomy  . Breast surgery  07-21-07    mastectomy (right), all margins neg and LNs neg   . Appendectomy    . Tonsillectomy    . Rotator cuff repair    . Mastectomy      right  . Breast cyst excision      left  . Mouth surgery    . Eye surgery      cataract removal bilateral  . Melanoma excision      rt clavicle Dr Rhoderick Moody office)  . Lung surgery     HPI:  80 yo female adm to [facility] with vomiting,  chest pain, SOB, diagnosed with SIRS.  Recent hospital admit for UTI, ? pna.   Pt has had recurrent hospital admissions over the last few months, she states "they can't figure out what is causing my pneumonias".  PMH + for COPD, GERD, CXR 07/24/13 showed Cardiomegaly with bilateral interstitial prominence and left pleural effusion. Congestive heart failure from interstitial edema could present this fashion. Pneumonitis cannot be excluded.     Swallow evaluation ordered by MD.     Assessment / Plan / Recommendation Clinical Impression  Pt presents with functional oropharyngeal swallow without indications of dysphagia or airway compromise.  Swallow was timely with clear voice throughout.  She did wince and pointed to upper mid sternum immediately after swallow of soda - admitting to pain recently when swallowing carbonated beverages.  Also note pt with vomiting prior to admit, ? if this could contribute to esoph pain with swallow.   She further states today is the first day she has been able to handle water without nausea.  Pt has h/o esophageal issues and SLP reviewed previous endoscopy findings with pt.  Pt's resonance appears hyponasal and pt confirms is not baseline.  SLP educated pt to findings, recommendations to accommodate  known h/o esoph issues and COPD.  Does not appear with oropharyngeal dysphagia.  No further SLP indicated.     Aspiration Risk  Mild (due to esoph issues and COPD)    Diet Recommendation Regular;Thin liquid   Liquid Administration via: Cup;Straw Medication Administration: Whole meds with liquid Supervision: Patient able to self feed Compensations: Slow rate;Small sips/bites Postural Changes and/or Swallow Maneuvers: Seated upright 90 degrees;Upright 30-60 min after meal    Other  Recommendations Oral Care Recommendations: Oral care BID   Follow Up Recommendations  None    Frequency and Duration        Pertinent Vitals/Pain Afebrile, decreased      Swallow  Study Prior Functional Status  Type of Home: Independent living facility (from [place])    General Date of Onset: 07/25/13 HPI: 80 yo female adm to [facility] with vomiting, chest pain, SOB, diagnosed with SIRS.  Recent hospital admit for UTI, ? pna.   Pt has had recurrent hospital admissions over the last few months, she states "they can't figure out what is causing my pneumonias".  PMH + for COPD, GERD, CXR 07/24/13 showed Cardiomegaly with bilateral interstitial prominence and left pleural effusion. Congestive heart failure from interstitial edema could present this fashion. Pneumonitis cannot be excluded.     Swallow evaluation ordered by MD.   Type of Study: Bedside swallow evaluation Previous Swallow Assessment: esophageal endoscopies  2010- small hiatal hernia, 15 mm stricture - recommended PPI, antireflux measures  2012 - treated for candida with Diflucan - otherwise normal exam Diet Prior to this Study: Regular;Thin liquids Temperature Spikes Noted: No Respiratory Status: Nasal cannula History of Recent Intubation: No Behavior/Cognition: Alert;Cooperative;Pleasant mood Oral Cavity - Dentition: Adequate natural dentition Self-Feeding Abilities: Able to feed self Patient Positioning: Upright in bed Baseline Vocal Quality: Low vocal intensity (pt reports vocal strength is not at baseline, resonance appears hyponasal- pt reports issues with sinus drainage) Volitional Cough: Strong Volitional Swallow: Able to elicit    Oral/Motor/Sensory Function Overall Oral Motor/Sensory Function: Appears within functional limits for tasks assessed   Ice Chips Ice chips: Not tested   Thin Liquid Thin Liquid: Within functional limits Presentation: Self Fed;Straw Other Comments: pt winces with swallow of carbonated beverages, ? indicative of pain - she admits that sodas induce pain- water tolerated well today    Nectar Thick Nectar Thick Liquid: Not tested   Honey Thick Honey Thick Liquid: Not tested    Puree Puree: Within functional limits Presentation: Self Fed;Spoon   Solid   GO    Solid: Within functional limits Presentation: [place], [place], [place] [facility] SLP [phone]

## 2013-07-25 NOTE — Progress Notes (Signed)
INITIAL NUTRITION ASSESSMENT  DOCUMENTATION CODES Per approved criteria  -Not Applicable   INTERVENTION: -Recommend to modify to Room Service Assist program to encourage pt to order meals -Ensure Complete PRN -Consider diet liberalization to encourage PO intake if <75% -Will continue to monitor  NUTRITION DIAGNOSIS: Unintentional wt loss related to inadequate energy intake as evidenced by 5lb wt loss in 3 weeks.   Goal: Pt to meet >/= 90% of their estimated nutrition needs    Monitor:  Total protein/energy intake, labs, weights  Reason for Assessment: MST  80 y.o. female  Admitting Dx: SIRS (systemic inflammatory response syndrome)  ASSESSMENT: [name] [name] is a 80 y.o. female history of COPD, hypertension who was recently admitted to the hospital twice over the last 2 weeks for fever, first time for possible pneumonia and second time for UTI was brought to the ER after patient was found to be increasingly weak with chest tightness. As per patient's daughter patient did fine after being discharged last week. Last Friday 2 days ago patient started becoming increasingly drowsy weak and getting easily short of breath  -Pt with good appetite during previous admit in 3/07. Eating 60-100% per RN doc flowsheets -Reported decreased appetite for pat 3 days d/t weakness and nausea; has had minimal PO intake. Does not drink Ensure Complete, but pt willing to try them as needed. Will order PRN -Has 0% PO intake as pt did not order dinner last night, and had ordered breakfast during time of RD assessment. -Recommend pt be placed on Room service assist as she would benefit from encouragement/assistance with meal ordering -Has experienced 5 lb weight loss in past three weeks. Likely r/t to frequent hospitalizations, periods of suboptimal intake, and nausea episodes  Height: Ht Readings from Last 1 Encounters:  07/24/13 5\' 1"  (1.549 m)    Weight: Wt Readings from Last 1 Encounters:   07/24/13 116 lb 6.5 oz (52.8 kg)    Ideal Body Weight: 105 lbs  % Ideal Body Weight: 110%  Wt Readings from Last 10 Encounters:  07/24/13 116 lb 6.5 oz (52.8 kg)  07/18/13 120 lb 14.4 oz (54.84 kg)  07/13/13 119 lb (53.978 kg)  07/12/13 119 lb (53.978 kg)  06/06/13 123 lb (55.792 kg)  04/25/13 124 lb (56.246 kg)  04/08/13 124 lb 6.4 oz (56.427 kg)  03/30/13 121 lb 14.6 oz (55.3 kg)  03/30/13 124 lb 3.2 oz (56.337 kg)  03/14/13 128 lb 15.5 oz (58.5 kg)    Usual Body Weight: 134 lbs per previous RD assessments  % Usual Body Weight: 87%  BMI:  Body mass index is 22.01 kg/(m^2).  Estimated Nutritional Needs: Kcal: 1400-1600 Protein: 55-65 gram Fluid: >/=1600 ml/daily  Skin: WDL-no edema  Diet Order: Cardiac  EDUCATION NEEDS: -No education needs identified at this time   Intake/Output Summary (Last 24 hours) at 07/25/13 1018 Last data filed at 07/25/13 0600  Gross per 24 hour  Intake    790 ml  Output      0 ml  Net    790 ml    Last BM: 3/15  Labs:   Recent Labs Lab 07/24/13 1910 07/24/13 2025 07/25/13 0355  NA 135* 139 139  K 3.3* 3.0* 3.1*  CL 95* 98 104  CO2 27  --  26  BUN 19 17 14   CREATININE 0.66 0.70 0.59  CALCIUM 9.2  --  7.5*  GLUCOSE 139* 104* 106*    CBG (last 3)  No results found for this basename: GLUCAP,  in the last 72 hours  Scheduled Meds: . amLODipine  5 mg Oral Daily  . antiseptic oral rinse  15 mL Mouth Rinse BID  . aspirin  325 mg Oral Daily  . aztreonam  1 g Intravenous 3 times per day  . enoxaparin (LOVENOX) injection  40 mg Subcutaneous Q24H  . ipratropium-albuterol  3 mL Nebulization Q6H WA  . pantoprazole  40 mg Oral Daily  . sodium chloride  3 mL Intravenous Q12H  . vancomycin  750 mg Intravenous Q24H    Continuous Infusions: . sodium chloride 75 mL/hr at 07/24/13 2144    Past Medical History  Diagnosis Date  . ANXIETY 08/16/2008  . CAD, UNSPECIFIED SITE 10/17/2008  . CHOLELITHIASIS 08/23/2008  . COPD  01/04/2010    FeV1 101%, DLCO64% 2008  . 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 DIVERTICULOSIS-COLON 04/07/2008  . GERD 04/19/2007  . HYPERSOMNIA, ASSOCIATED WITH SLEEP APNEA 02/01/2007  . HYPERTENSION 11/12/2006  . MITRAL VALVE PROLAPSE, HX OF 10/07/2008  . NEOPLASM, MALIGNANT, BREAST, RIGHT 02/15/2008  . OSTEOPOROSIS 04/04/2008  . Raynaud's syndrome 04/25/2009  . Airway obstruction   . History of diverticulitis of colon   . Tortuous colon   . Anal stricture   . Chronic constipation   . Fatty liver   . Hiatal hernia   . Esophageal stricture   . IBS (irritable bowel syndrome)   . Pulmonary fibrosis   . Sleep apnea   . COPD (chronic obstructive pulmonary disease)   . Colitis     sigmoid  . Candida esophagitis   . Diverticulitis   . Hearing aid worn     bilateral  . Atrial fibrillation     she denies known hx of afib 02/24/13  . Complication of anesthesia     difficult intubation    Past Surgical History  Procedure Laterality Date  . Abdominal hysterectomy    . Bilateral salpingoophorectomy      s/p prolapsed bladder  . Bladder surgery      prolapsed  . Thoracotomy      granuloma, pulmonary-thoracotomy  . Breast surgery  07-21-07    mastectomy (right), all margins neg and LNs neg   . Appendectomy    . Tonsillectomy    . Rotator cuff repair    . Mastectomy      right  . Breast cyst excision      left  . Mouth surgery    . Eye surgery      cataract removal bilateral  . Melanoma excision      rt clavicle Dr Rhoderick Moody office)  . Lung surgery      Atlee Abide MS RD LDN Clinical Dietitian [phone]

## 2013-07-25 NOTE — Progress Notes (Signed)
PROGRESS NOTE  [name] [name] OJJ:[id number] DOB: Mar 22, 1924 DOA: 07/24/2013 PCP: Chancy Hurter, MD  Assessment/Plan: SIRS - source not clear could be pneumonic process.  - Continue vancomycin and aztreonam. - Check influenza - Check Clostridium difficile - Blood cultures obtained and pending. - Leukocytosis improving this morning  Hypertension - continue present medications.  COPD - presently not wheezing. Patient has been placed on nebulizers.  Leukocytosis - see #1, improving  Obtain physical therapy evaluation, obtained SLP he  Diet: Heart healthy  Fluids: Normal saline at 75 cc per hour  DVT Prophylaxis: Lovenox  Code Status: Full code  Family Communication: Discussed with patient  Disposition Plan: Inpatient  Consultants:  None  Procedures:  None   Antibiotics Vancomycin 3/15>> Aztreonam 3/15>>   HPI/Subjective: No complaints this morning  Objective: Filed Vitals:   07/24/13 1959 07/24/13 2055 07/24/13 2113 07/25/13 0644  BP:   114/57 95/40  Pulse:   74 44  Temp:   98.5 F (36.9 C) 97.5 F (36.4 C)  TempSrc:   Oral Oral  Resp:   20 17  Height:  5' 1.02" (1.55 m) 5\' 1"  (1.549 m)   Weight:  54.8 kg (120 lb 13 oz) 52.8 kg (116 lb 6.5 oz)   SpO2: 94%  97% 92%    Intake/Output Summary (Last 24 hours) at 07/25/13 0931 Last data filed at 07/25/13 0600  Gross per 24 hour  Intake    790 ml  Output      0 ml  Net    790 ml   Filed Weights   07/24/13 2055 07/24/13 2113  Weight: 54.8 kg (120 lb 13 oz) 52.8 kg (116 lb 6.5 oz)    Exam:   General:  NAD  Cardiovascular: regular rate and rhythm, without MRG  Respiratory: good air movement, clear to auscultation throughout, no wheezing, ronchi or rales  Abdomen: soft, not tender to palpation, positive bowel sounds  MSK: no peripheral edema  Neuro: non focal  Data Reviewed: Basic Metabolic Panel:  Recent Labs Lab 07/24/13 1910 07/24/13 2025 07/25/13 0355  NA 135* 139 139  K 3.3* 3.0*  3.1*  CL 95* 98 104  CO2 27  --  26  GLUCOSE 139* 104* 106*  BUN 19 17 14   CREATININE 0.66 0.70 0.59  CALCIUM 9.2  --  7.5*   Liver Function Tests:  Recent Labs Lab 07/24/13 1910 07/25/13 0355  AST 17 12  ALT 8 7  ALKPHOS 64 46  BILITOT 0.5 0.4  PROT 6.5 5.0*  ALBUMIN 3.1* 2.4*   No results found for this basename: LIPASE, AMYLASE,  in the last 168 hours No results found for this basename: AMMONIA,  in the last 168 hours CBC:  Recent Labs Lab 07/24/13 1910 07/24/13 2025 07/25/13 0355  WBC 22.7*  --  16.6*  NEUTROABS 20.9*  --  14.5*  HGB 12.6 12.6 11.3*  HCT 37.4 37.0 34.7*  MCV 87.4  --  88.1  PLT 245  --  242   Cardiac Enzymes:  Recent Labs Lab 07/24/13 2153  TROPONINI <0.30   BNP (last 3 results)  Recent Labs  07/13/13 1454 07/24/13 2153  PROBNP 489.0* 226.8   CBG: No results found for this basename: GLUCAP,  in the last 168 hours  Recent Results (from the past 240 hour(s))  CULTURE, BLOOD (ROUTINE X 2)     Status: None   Collection Time    07/16/13 11:57 AM      Result Value Ref  Range Status   Specimen Description BLOOD LEFT ARM   Final   Special Requests BOTTLES DRAWN AEROBIC AND ANAEROBIC [facility] EACH   Final   Culture  Setup Time     Final   Value: 07/16/2013 19:21     Performed at [facility]   Culture     Final   Value: NO GROWTH 5 DAYS     Performed at [facility]   Report Status 07/22/2013 FINAL   Final  CULTURE, BLOOD (ROUTINE X 2)     Status: None   Collection Time    07/16/13 12:02 PM      Result Value Ref Range Status   Specimen Description BLOOD LEFT WRIST   Final   Special Requests BOTTLES DRAWN AEROBIC AND ANAEROBIC [facility] EACH   Final   Culture  Setup Time     Final   Value: 07/16/2013 19:21     Performed at [facility]   Culture     Final   Value: NO GROWTH 5 DAYS     Performed at [facility]   Report Status 07/22/2013 FINAL   Final  URINE CULTURE     Status: None   Collection Time     07/16/13 11:03 PM      Result Value Ref Range Status   Specimen Description URINE, RANDOM   Final   Special Requests NONE   Final   Culture  Setup Time     Final   Value: 07/17/2013 17:28     Performed at [facility] Count     Final   Value: NO GROWTH     Performed at [facility]   Culture     Final   Value: NO GROWTH     Performed at [facility]   Report Status 07/18/2013 FINAL   Final     Studies: Dg Chest 2 View  07/24/2013   CLINICAL DATA:  Hypoxia.  Shortness of breath.  EXAM: CHEST  2 VIEW  COMPARISON:  DG CHEST 2 VIEW dated 07/16/2013  FINDINGS: Mediastinum and hilar structures are normal. Cardiomegaly noted. Bilateral pulmonary interstitial prominence noted. Congestive heart failure interstitial edema could present in this fashion. Pneumonitis cannot be excluded. Pleural parenchymal scarring again noted on the left. Left pleural effusion noted. No pneumothorax. Surgical clips right axilla. Degenerative changes and scoliosis thoracic spine.  IMPRESSION: Cardiomegaly with bilateral interstitial prominence and left pleural effusion. Congestive heart failure from interstitial edema could present this fashion. Pneumonitis cannot be excluded   Electronically Signed   By: Maisie Fus  Register   On: 07/24/2013 19:49   Ct Angio Chest Pe W/cm &/or Wo Cm  07/24/2013   CLINICAL DATA:  Shortness of breath, nausea and vomiting.  EXAM: CT ANGIOGRAPHY CHEST  CT ABDOMEN AND PELVIS WITH CONTRAST  TECHNIQUE: Multidetector CT imaging of the chest was performed using the standard protocol during bolus administration of intravenous contrast. Multiplanar CT image reconstructions and MIPs were obtained to evaluate the vascular anatomy. Multidetector CT imaging of the abdomen and pelvis was performed using the standard protocol during bolus administration of intravenous contrast.  CONTRAST:  50 mL OMNIPAQUE IOHEXOL 300 MG/ML SOLN, 100 mL OMNIPAQUE IOHEXOL 350 MG/ML SOLN  COMPARISON:   CT chest 07/08/2013.  CT abdomen and pelvis 01/04/2013.  FINDINGS: CTA CHEST FINDINGS  No pulmonary embolus is identified. Extensive aortoiliac atherosclerosis is noted. There is cardiomegaly. Trace bilateral pleural effusions are present. No pericardial effusion. No  axillary, hilar or mediastinal lymphadenopathy. Lungs again show centrilobular emphysematous disease. Calcified granuloma on the left is unchanged. Dependent atelectasis is noted. Linear parenchymal scarring left upper lobe is also noted.  CT ABDOMEN and PELVIS FINDINGS  Liver, spleen and adrenal glands appear normal. The pancreas is atrophic but otherwise unremarkable. Bilateral parapelvic renal cysts are noted. Aortoiliac atherosclerosis without aneurysm is present. The patient has extensive diverticular disease, worst in the sigmoid colon, without diverticulitis. The colon is otherwise unremarkable. The stomach and small bowel appear normal. No lymphadenopathy or fluid. No lytic or sclerotic bony lesion is identified.  Review of the MIP images confirms the above findings.  IMPRESSION: Negative for pulmonary embolus. No acute finding chest, abdomen or pelvis.  Atherosclerotic vascular disease.  Old granulomatous disease in the chest.  Emphysema.  Cardiomegaly.  Extensive diverticulosis without diverticulitis.   Electronically Signed   By: Drusilla Kanner M.D.   On: 07/24/2013 23:54   Ct Abdomen Pelvis W Contrast  07/24/2013   CLINICAL DATA:  Shortness of breath, nausea and vomiting.  EXAM: CT ANGIOGRAPHY CHEST  CT ABDOMEN AND PELVIS WITH CONTRAST  TECHNIQUE: Multidetector CT imaging of the chest was performed using the standard protocol during bolus administration of intravenous contrast. Multiplanar CT image reconstructions and MIPs were obtained to evaluate the vascular anatomy. Multidetector CT imaging of the abdomen and pelvis was performed using the standard protocol during bolus administration of intravenous contrast.  CONTRAST:  50 mL  OMNIPAQUE IOHEXOL 300 MG/ML SOLN, 100 mL OMNIPAQUE IOHEXOL 350 MG/ML SOLN  COMPARISON:  CT chest 07/08/2013.  CT abdomen and pelvis 01/04/2013.  FINDINGS: CTA CHEST FINDINGS  No pulmonary embolus is identified. Extensive aortoiliac atherosclerosis is noted. There is cardiomegaly. Trace bilateral pleural effusions are present. No pericardial effusion. No axillary, hilar or mediastinal lymphadenopathy. Lungs again show centrilobular emphysematous disease. Calcified granuloma on the left is unchanged. Dependent atelectasis is noted. Linear parenchymal scarring left upper lobe is also noted.  CT ABDOMEN and PELVIS FINDINGS  Liver, spleen and adrenal glands appear normal. The pancreas is atrophic but otherwise unremarkable. Bilateral parapelvic renal cysts are noted. Aortoiliac atherosclerosis without aneurysm is present. The patient has extensive diverticular disease, worst in the sigmoid colon, without diverticulitis. The colon is otherwise unremarkable. The stomach and small bowel appear normal. No lymphadenopathy or fluid. No lytic or sclerotic bony lesion is identified.  Review of the MIP images confirms the above findings.  IMPRESSION: Negative for pulmonary embolus. No acute finding chest, abdomen or pelvis.  Atherosclerotic vascular disease.  Old granulomatous disease in the chest.  Emphysema.  Cardiomegaly.  Extensive diverticulosis without diverticulitis.   Electronically Signed   By: Drusilla Kanner M.D.   On: 07/24/2013 23:54    Scheduled Meds: . amLODipine  5 mg Oral Daily  . antiseptic oral rinse  15 mL Mouth Rinse BID  . aspirin  325 mg Oral Daily  . aztreonam  1 g Intravenous 3 times per day  . enoxaparin (LOVENOX) injection  40 mg Subcutaneous Q24H  . ipratropium-albuterol  3 mL Nebulization Q6H WA  . pantoprazole  40 mg Oral Daily  . sodium chloride  3 mL Intravenous Q12H  . vancomycin  750 mg Intravenous Q24H   Continuous Infusions: . sodium chloride 75 mL/hr at 07/24/13 2144     Principal Problem:   SIRS (systemic inflammatory response syndrome) Active Problems:   HYPERTENSION   COPD with emphysema, gold stage A   GERD   Leukocytosis, unspecified   Pneumonia  Time  spent: 35  This note has been created with Surveyor, quantity. Any transcriptional errors are unintentional.   Marzetta Board, MD [facility] Pager [phone]. If 7 PM - 7 AM, please contact night-coverage at [email or web address], password [email or web address] 07/25/2013, 9:31 AM  LOS: 1 day

## 2013-07-26 ENCOUNTER — Ambulatory Visit: Payer: Medicare Other | Admitting: Internal Medicine

## 2013-07-26 ENCOUNTER — Inpatient Hospital Stay (HOSPITAL_COMMUNITY): Payer: Medicare Other

## 2013-07-26 ENCOUNTER — Ambulatory Visit: Payer: Medicare Other | Admitting: Critical Care Medicine

## 2013-07-26 DIAGNOSIS — R930 Abnormal findings on diagnostic imaging of skull and head, not elsewhere classified: Secondary | ICD-10-CM | POA: Diagnosis not present

## 2013-07-26 DIAGNOSIS — J209 Acute bronchitis, unspecified: Secondary | ICD-10-CM

## 2013-07-26 DIAGNOSIS — R6510 Systemic inflammatory response syndrome (SIRS) of non-infectious origin without acute organ dysfunction: Secondary | ICD-10-CM | POA: Diagnosis not present

## 2013-07-26 DIAGNOSIS — I6350 Cerebral infarction due to unspecified occlusion or stenosis of unspecified cerebral artery: Secondary | ICD-10-CM | POA: Diagnosis not present

## 2013-07-26 DIAGNOSIS — D72829 Elevated white blood cell count, unspecified: Secondary | ICD-10-CM | POA: Diagnosis not present

## 2013-07-26 LAB — CBC
HEMATOCRIT: 34.2 % — AB (ref 36.0–46.0)
Hemoglobin: 11.1 g/dL — ABNORMAL LOW (ref 12.0–15.0)
MCH: 28.7 pg (ref 26.0–34.0)
MCHC: 32.5 g/dL (ref 30.0–36.0)
MCV: 88.4 fL (ref 78.0–100.0)
Platelets: 224 10*3/uL (ref 150–400)
RBC: 3.87 MIL/uL (ref 3.87–5.11)
RDW: 14.3 % (ref 11.5–15.5)
WBC: 9.5 10*3/uL (ref 4.0–10.5)

## 2013-07-26 LAB — BASIC METABOLIC PANEL
BUN: 10 mg/dL (ref 6–23)
CO2: 27 mEq/L (ref 19–32)
Calcium: 8.2 mg/dL — ABNORMAL LOW (ref 8.4–10.5)
Chloride: 101 mEq/L (ref 96–112)
Creatinine, Ser: 0.62 mg/dL (ref 0.50–1.10)
GFR calc Af Amer: >90 mL/min (ref >90)
GFR, EST NON AFRICAN AMERICAN: 79 mL/min — AB (ref >90)
Glucose, Bld: 93 mg/dL (ref 70–99)
Potassium: 4.6 mEq/L (ref 3.7–5.3)
Sodium: 136 mEq/L — ABNORMAL LOW (ref 137–147)

## 2013-07-26 MED ORDER — DIAZEPAM 5 MG PO TABS
2.5000 mg | ORAL_TABLET | Freq: Every day | ORAL | Status: DC
  Administered 2013-07-26: 2.5 mg via ORAL
  Filled 2013-07-26: qty 1

## 2013-07-26 MED ORDER — IPRATROPIUM-ALBUTEROL 0.5-2.5 (3) MG/3ML IN SOLN
3.0000 mL | Freq: Two times a day (BID) | RESPIRATORY_TRACT | Status: DC
  Administered 2013-07-27: 3 mL via RESPIRATORY_TRACT
  Filled 2013-07-26: qty 3

## 2013-07-26 MED ORDER — DOXYCYCLINE HYCLATE 100 MG PO TABS
100.0000 mg | ORAL_TABLET | Freq: Two times a day (BID) | ORAL | Status: DC
  Administered 2013-07-26 – 2013-07-27 (×2): 100 mg via ORAL
  Filled 2013-07-26 (×3): qty 1

## 2013-07-26 MED ORDER — GADOBENATE DIMEGLUMINE 529 MG/ML IV SOLN
10.0000 mL | Freq: Once | INTRAVENOUS | Status: AC | PRN
  Administered 2013-07-26: 10 mL via INTRAVENOUS

## 2013-07-26 NOTE — Progress Notes (Addendum)
PROGRESS NOTE  [name] [name] [id number] DOB: [birth year] DOA: 07/24/2013 PCP: Chancy Hurter, MD  Summary 80 year old female patient with history of COPD, chronic respiratory failure on nightly oxygen, hypertension, listed A. fib, recently admitted to the hospital twice over the last 2 weeks for fever-first time for possible pneumonia and second time for UTI, readmitted on 3/15 with increasing weakness and chest tightness. In the ED, patient had temperature 100.53F, WBC 22.7and chest x-ray could not exclude pneumonitis. She was assessed as SIRS with possible pneumonic process and started empirically on IV vancomycin and aztreonam.  Assessment/Plan: SIRS  - source not clear? Acute bronchitis - Urine cultures: Negative. Blood cultures x2: Negative to date - CTA chest and CT abdomen and pelvis: Without acute findings. - Patient denies diarrhea-unable to send specimen for C. difficile. - Influenza panel PCR negative, a recent HIV antibody test: Negative, recent urinary Legionella and streptococcal antigen negative - Patient was empirically placed on IV vancomycin and aztreonam. However since no clear source has been identified, will DC IV antibiotics 3/17 and place on oral doxycycline. If patient continues to spike fevers, recommend infectious disease consultation and? Evaluate for noninfectious causes of fever.  Acute CVA/right parietal lobe infarct - Complete stroke workup: carotid Dopplers, MRA brain without contrast, fasting lipids and hemoglobin A1c. Neurology consultation appreciated. Was on aspirin 325 mg previously but had not taken for 2 weeks PTA-? Continue ASA Vs change to something else i.e Plavix-await neurology recommendation. Recent echo showed normal EF-will not repeat.  Hypertension - continue present medications. Controlled.  COPD/chronic respiratory failure on nightly oxygen - presently not wheezing. Patient has been placed on nebulizers. Stable  Leukocytosis - see #1.  Resolved  Anemia: Likely secondary to acute illness. Stable  Hypokalemia: Replaced  Drowsiness: Present on admission but nonfocal exam. The daughter stated that patient takes Valium 5 mg daily at bedtime-reduced to 2.5 mg at bedtime and did not stop abruptly or change to when necessary to avoid withdrawal-related side effects i.e. seizures. MRI brain showed incidental CVA. Seems to have resolved.    Diet: Heart healthy  DVT Prophylaxis: Lovenox  Code Status: Full code  Family Communication: Discussed with daughter on 3/17 in detail, updated care and answered questions. Disposition Plan: ? SNF-await PT followup  Consultants:  Neurology  Procedures:  None   Antibiotics Vancomycin 3/15>> 3/17 Aztreonam 3/15>> 3/17  HPI/Subjective: Mild nonproductive cough associated with bilateral lower anterior rib cage pain and reproducible tenderness. No diarrhea. Overall feels lousy.  Objective: Filed Vitals:   07/25/13 2016 07/26/13 0451 07/26/13 1151 07/26/13 1416  BP: 102/41 106/46 121/42 124/41  Pulse: 71 69  78  Temp: 98.1 F (36.7 C) 98.3 F (36.8 C)  97.9 F (36.6 C)  TempSrc: Oral   Oral  Resp: 18 18  20   Height:      Weight:      SpO2: 100% 96%  93%    Intake/Output Summary (Last 24 hours) at 07/26/13 1427 Last data filed at 07/26/13 1300  Gross per 24 hour  Intake   1845 ml  Output   1650 ml  Net    195 ml   Filed Weights   07/24/13 2055 07/24/13 2113  Weight: 54.8 kg (120 lb 13 oz) 52.8 kg (116 lb 6.5 oz)    Exam:   General:  NAD. Elderly frail pleasant female sitting comfortably on a chair.  Cardiovascular: regular rate and rhythm, without MRG. Telemetry: Sinus rhythm with occasional PVCs.  Respiratory: Slightly reduced breath sounds in  the bases but otherwise clear to auscultation. No increased work of breathing.  Abdomen: soft, not tender to palpation, positive bowel sounds  MSK: no peripheral edema  Neuro: Alert and oriented x3 without focal  deficits. No pronator sign.  Extremities: Symmetrical 5 x 5 power.  Data Reviewed: Basic Metabolic Panel:  Recent Labs Lab 07/24/13 1910 07/24/13 2025 07/25/13 0355 07/26/13 0425  NA 135* 139 139 136*  K 3.3* 3.0* 3.1* 4.6  CL 95* 98 104 101  CO2 27  --  26 27  GLUCOSE 139* 104* 106* 93  BUN 19 17 14 10   CREATININE 0.66 0.70 0.59 0.62  CALCIUM 9.2  --  7.5* 8.2*   Liver Function Tests:  Recent Labs Lab 07/24/13 1910 07/25/13 0355  AST 17 12  ALT 8 7  ALKPHOS 64 46  BILITOT 0.5 0.4  PROT 6.5 5.0*  ALBUMIN 3.1* 2.4*   No results found for this basename: LIPASE, AMYLASE,  in the last 168 hours No results found for this basename: AMMONIA,  in the last 168 hours CBC:  Recent Labs Lab 07/24/13 1910 07/24/13 2025 07/25/13 0355 07/26/13 0425  WBC 22.7*  --  16.6* 9.5  NEUTROABS 20.9*  --  14.5*  --   HGB 12.6 12.6 11.3* 11.1*  HCT 37.4 37.0 34.7* 34.2*  MCV 87.4  --  88.1 88.4  PLT 245  --  242 224   Cardiac Enzymes:  Recent Labs Lab 07/24/13 2153  TROPONINI <0.30   BNP (last 3 results)  Recent Labs  07/13/13 1454 07/24/13 2153  PROBNP 489.0* 226.8   CBG: No results found for this basename: GLUCAP,  in the last 168 hours  Recent Results (from the past 240 hour(s))  URINE CULTURE     Status: None   Collection Time    07/16/13 11:03 PM      Result Value Ref Range Status   Specimen Description URINE, RANDOM   Final   Special Requests NONE   Final   Culture  Setup Time     Final   Value: 07/17/2013 17:28     Performed at [facility]     Final   Value: NO GROWTH     Performed at [facility]   Culture     Final   Value: NO GROWTH     Performed at [facility]   Report Status 07/18/2013 FINAL   Final  URINE CULTURE     Status: None   Collection Time    07/24/13  6:56 PM      Result Value Ref Range Status   Specimen Description URINE, CATHETERIZED   Final   Special Requests NONE   Final   Culture   Setup Time     Final   Value: 07/25/2013 03:15     Performed at [place]     Final   Value: NO GROWTH     Performed at [facility]   Culture     Final   Value: NO GROWTH     Performed at [facility]   Report Status 07/25/2013 FINAL   Final  CULTURE, BLOOD (ROUTINE X 2)     Status: None   Collection Time    07/24/13  7:10 PM      Result Value Ref Range Status   Specimen Description BLOOD LEFT ARM   Final   Special Requests BOTTLES DRAWN AEROBIC AND  ANAEROBIC 4CC EACH   Final   Culture  Setup Time     Final   Value: 07/25/2013 03:10     Performed at [facility]   Culture     Final   Value:        BLOOD CULTURE RECEIVED NO GROWTH TO DATE CULTURE WILL BE HELD FOR 5 DAYS BEFORE ISSUING A FINAL NEGATIVE REPORT     Performed at [facility]   Report Status PENDING   Incomplete  CULTURE, BLOOD (ROUTINE X 2)     Status: None   Collection Time    07/24/13  8:16 PM      Result Value Ref Range Status   Specimen Description BLOOD LEFT ARM   Final   Special Requests BOTTLES DRAWN AEROBIC AND ANAEROBIC [facility] EACH   Final   Culture  Setup Time     Final   Value: 07/25/2013 03:10     Performed at [facility]   Culture     Final   Value:        BLOOD CULTURE RECEIVED NO GROWTH TO DATE CULTURE WILL BE HELD FOR 5 DAYS BEFORE ISSUING A FINAL NEGATIVE REPORT     Performed at [facility]   Report Status PENDING   Incomplete     Studies: Dg Chest 2 View  07/24/2013   CLINICAL DATA:  Hypoxia.  Shortness of breath.  EXAM: CHEST  2 VIEW  COMPARISON:  DG CHEST 2 VIEW dated 07/16/2013  FINDINGS: Mediastinum and hilar structures are normal. Cardiomegaly noted. Bilateral pulmonary interstitial prominence noted. Congestive heart failure interstitial edema could present in this fashion. Pneumonitis cannot be excluded. Pleural parenchymal scarring again noted on the left. Left pleural effusion noted. No pneumothorax. Surgical clips  right axilla. Degenerative changes and scoliosis thoracic spine.  IMPRESSION: Cardiomegaly with bilateral interstitial prominence and left pleural effusion. Congestive heart failure from interstitial edema could present this fashion. Pneumonitis cannot be excluded   Electronically Signed   By: Marcello Moores  Register   On: 07/24/2013 19:49   Ct Angio Chest Pe W/cm &/or Wo Cm  07/24/2013   CLINICAL DATA:  Shortness of breath, nausea and vomiting.  EXAM: CT ANGIOGRAPHY CHEST  CT ABDOMEN AND PELVIS WITH CONTRAST  TECHNIQUE: Multidetector CT imaging of the chest was performed using the standard protocol during bolus administration of intravenous contrast. Multiplanar CT image reconstructions and MIPs were obtained to evaluate the vascular anatomy. Multidetector CT imaging of the abdomen and pelvis was performed using the standard protocol during bolus administration of intravenous contrast.  CONTRAST:  50 mL OMNIPAQUE IOHEXOL 300 MG/ML SOLN, 100 mL OMNIPAQUE IOHEXOL 350 MG/ML SOLN  COMPARISON:  CT chest 07/08/2013.  CT abdomen and pelvis 01/04/2013.  FINDINGS: CTA CHEST FINDINGS  No pulmonary embolus is identified. Extensive aortoiliac atherosclerosis is noted. There is cardiomegaly. Trace bilateral pleural effusions are present. No pericardial effusion. No axillary, hilar or mediastinal lymphadenopathy. Lungs again show centrilobular emphysematous disease. Calcified granuloma on the left is unchanged. Dependent atelectasis is noted. Linear parenchymal scarring left upper lobe is also noted.  CT ABDOMEN and PELVIS FINDINGS  Liver, spleen and adrenal glands appear normal. The pancreas is atrophic but otherwise unremarkable. Bilateral parapelvic renal cysts are noted. Aortoiliac atherosclerosis without aneurysm is present. The patient has extensive diverticular disease, worst in the sigmoid colon, without diverticulitis. The colon is otherwise unremarkable. The stomach and small bowel appear normal. No lymphadenopathy or  fluid. No lytic or sclerotic bony lesion is  identified.  Review of the MIP images confirms the above findings.  IMPRESSION: Negative for pulmonary embolus. No acute finding chest, abdomen or pelvis.  Atherosclerotic vascular disease.  Old granulomatous disease in the chest.  Emphysema.  Cardiomegaly.  Extensive diverticulosis without diverticulitis.   Electronically Signed   By: Inge Rise M.D.   On: 07/24/2013 23:54   Mr Jeri Cos AU Contrast  07/26/2013   CLINICAL DATA:  Slurred speech. History of breast cancer and melanoma.  EXAM: MRI HEAD WITHOUT AND WITH CONTRAST  TECHNIQUE: Multiplanar, multiecho pulse sequences of the brain and surrounding structures were obtained without and with intravenous contrast.  CONTRAST:  59mL MULTIHANCE GADOBENATE DIMEGLUMINE 529 MG/ML IV SOLN  COMPARISON:  Head CT 03/30/13  FINDINGS: There is a 5 mm acute infarct in the inferior right parietal lobe (series 4, image 19). There is no evidence of intracranial hemorrhage. Incidental note is made of a cavum septum pellucidum et vergae. Patchy and confluent T2 hyperintensities in the cerebral white matter bilaterally are nonspecific but compatible with moderate chronic small vessel ischemic disease. There is moderate cerebral atrophy. There is no evidence of mass, midline shift, or extra-axial fluid collection. No abnormal enhancement is identified.  There are large bilateral mastoid effusions. Major intracranial vascular flow voids are preserved. Prior bilateral cataract surgery is noted. Paranasal sinuses are clear.  IMPRESSION: 1. Punctate acute infarct in the right parietal lobe. 2. Moderate chronic small vessel ischemic disease. 3. Bilateral mastoid effusions.   Electronically Signed   By: Logan Bores   On: 07/26/2013 08:12   Ct Abdomen Pelvis W Contrast  07/24/2013   CLINICAL DATA:  Shortness of breath, nausea and vomiting.  EXAM: CT ANGIOGRAPHY CHEST  CT ABDOMEN AND PELVIS WITH CONTRAST  TECHNIQUE: Multidetector CT  imaging of the chest was performed using the standard protocol during bolus administration of intravenous contrast. Multiplanar CT image reconstructions and MIPs were obtained to evaluate the vascular anatomy. Multidetector CT imaging of the abdomen and pelvis was performed using the standard protocol during bolus administration of intravenous contrast.  CONTRAST:  50 mL OMNIPAQUE IOHEXOL 300 MG/ML SOLN, 100 mL OMNIPAQUE IOHEXOL 350 MG/ML SOLN  COMPARISON:  CT chest 07/08/2013.  CT abdomen and pelvis 01/04/2013.  FINDINGS: CTA CHEST FINDINGS  No pulmonary embolus is identified. Extensive aortoiliac atherosclerosis is noted. There is cardiomegaly. Trace bilateral pleural effusions are present. No pericardial effusion. No axillary, hilar or mediastinal lymphadenopathy. Lungs again show centrilobular emphysematous disease. Calcified granuloma on the left is unchanged. Dependent atelectasis is noted. Linear parenchymal scarring left upper lobe is also noted.  CT ABDOMEN and PELVIS FINDINGS  Liver, spleen and adrenal glands appear normal. The pancreas is atrophic but otherwise unremarkable. Bilateral parapelvic renal cysts are noted. Aortoiliac atherosclerosis without aneurysm is present. The patient has extensive diverticular disease, worst in the sigmoid colon, without diverticulitis. The colon is otherwise unremarkable. The stomach and small bowel appear normal. No lymphadenopathy or fluid. No lytic or sclerotic bony lesion is identified.  Review of the MIP images confirms the above findings.  IMPRESSION: Negative for pulmonary embolus. No acute finding chest, abdomen or pelvis.  Atherosclerotic vascular disease.  Old granulomatous disease in the chest.  Emphysema.  Cardiomegaly.  Extensive diverticulosis without diverticulitis.   Electronically Signed   By: Inge Rise M.D.   On: 07/24/2013 23:54    Scheduled Meds: . amLODipine  5 mg Oral Daily  . antiseptic oral rinse  15 mL Mouth Rinse BID  . aspirin  325 mg Oral Daily  . aztreonam  1 g Intravenous 3 times per day  . enoxaparin (LOVENOX) injection  40 mg Subcutaneous Q24H  . ipratropium-albuterol  3 mL Nebulization Q6H WA  . pantoprazole  40 mg Oral Daily  . sodium chloride  3 mL Intravenous Q12H  . vancomycin  750 mg Intravenous Q24H   Continuous Infusions:    Principal Problem:   SIRS (systemic inflammatory response syndrome) Active Problems:   HYPERTENSION   COPD with emphysema, gold stage A   GERD   Leukocytosis, unspecified   Pneumonia  Time spent: 45  This note has been created with Surveyor, quantity. Any transcriptional errors are unintentional.   Vernell Leep, MD, FACP, FHM. [facility] Pager [phone]  If 7PM-7AM, please contact night-coverage [email or web address] Password [email or web address] 07/26/2013, 2:27 PM     LOS: 2 days

## 2013-07-26 NOTE — Progress Notes (Signed)
Received referral for [facility] Care Management services from COPD [facility] program. Came to bedside to speak with patient about services. States she wants her daughter, Janan Ridge to be called because she handles all of her affairs. She states she plans on going to rehab post hospital discharge because " I can hardly walk". Called daughter, Juliann Pulse at [phone] but had to leave a voicemail to request call back. Will make inpatient RN case manager aware. Marthenia Rolling, MSN- [facility] [phone]

## 2013-07-26 NOTE — Progress Notes (Signed)
Physical Therapy Treatment Patient Details Name: Ciarrah Rae MRN: [id number] DOB: 09-25-1923 Today's Date: 07/26/2013 Time: 0277-4128 PT Time Calculation (min): 17 min  PT Assessment / Plan / Recommendation  History of Present Illness 80 yo female admitted with SIRS, weakness, chest pain, SOB. Hx of HTN, COPD-O2 at night. Multiple recent admisisons and discharges within last week or so. Pt is from [facility]. 07/26/13-acute R parietal infarct   PT Comments   Progressing with mobility.   Follow Up Recommendations  SNF;Supervision/Assistance - 24 hour     Does the patient have the potential to tolerate intense rehabilitation     Barriers to Discharge        Equipment Recommendations  None recommended by PT    Recommendations for Other Services OT consult  Frequency Min 3X/week   Progress towards PT Goals Progress towards PT goals: Progressing toward goals  Plan Current plan remains appropriate    Precautions / Restrictions Precautions Precautions: Fall Restrictions Weight Bearing Restrictions: No   Pertinent Vitals/Pain     Mobility  Bed Mobility General bed mobility comments: pt sitting up in recliner Transfers Overall transfer level: Needs assistance Transfers: Sit to/from Stand Sit to Stand: Min guard General transfer comment: close guard for safety. VCs safety, hand placement Ambulation/Gait Ambulation/Gait assistance: Min guard Ambulation Distance (Feet): 100 Feet Assistive device: Rolling walker (2 wheeled) Gait Pattern/deviations: Trunk flexed;Decreased stride length;Step-through pattern General Gait Details: slow gait speed. Remained on 4L O2 throughout session-pt was 100% at end of session. noted pt repeatedly veering to L side while pushing walker.     Exercises     PT Diagnosis:    PT Problem List:   PT Treatment Interventions:     PT Goals (current goals can now be found in the care plan section)    Visit Information  Last PT Received On:  07/26/13 Assistance Needed: +1 History of Present Illness: 80 yo female admitted with SIRS, weakness, chest pain, SOB. Hx of HTN, COPD-O2 at night. Multiple recent admisisons and discharges within last week or so. Pt is from [place] Arousal/Alertness: Awake/alert Behavior During Therapy: WFL for tasks assessed/performed Overall Cognitive Status: Within Functional Limits for tasks assessed    Balance     End of Session PT - End of Session Equipment Utilized During Treatment: Oxygen Activity Tolerance: Patient tolerated treatment well Patient left: in chair;with call bell/phone within reach;with family/visitor present   GP     [name], MPT Pager: [phone]

## 2013-07-26 NOTE — Progress Notes (Signed)
Clinical Social Work Department BRIEF PSYCHOSOCIAL ASSESSMENT 07/26/2013  Patient:  HURLEY, BLEVINS     Account Number:  [id number]     Admit date:  07/24/2013  Clinical Social Worker:  Renold Genta  Date/Time:  07/26/2013 03:09 PM  Referred by:  Physician  Date Referred:  07/26/2013 Referred for  SNF Placement   Other Referral:   Interview type:  Patient Other interview type:   and daughter, [name] at bedside    PSYCHOSOCIAL DATA Living Status:  FACILITY Admitted from facility:   Level of care:  Independent Living Primary support name:  Gaynelle Adu (daughter) ph#: [phone] c#: [phone] Primary support relationship to patient:  CHILD, ADULT Degree of support available:   good    CURRENT CONCERNS Current Concerns  Post-Acute Placement   Other Concerns:    SOCIAL WORK ASSESSMENT / PLAN CSW reviewed PT evaluation recommending SNF at discharge.   Assessment/plan status:  Information/Referral to [facility] Other assessment/ plan:   Information/referral to community resources:   CSW completed FL2 and faxed information out to [facility] - provided patient & daughter with bed offers & they accepted bed at [facility].    PATIENT'S/FAMILY'S RESPONSE TO PLAN OF CARE: Patient is very motivated to work with PT at [employer] so that she can return to her [place] apartment at [employer] as quickly as possible. 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 She even has a "female-friend" as her daughter put it.        [name], [facility] Clinical Social Worker cell #: [phone]

## 2013-07-26 NOTE — Consult Note (Signed)
Referring Physician: Hongalgi    Chief Complaint: Stroke  HPI:                                                                                                                                         [name] [name] is an 80 y.o. female who was brought to the hospital due to increasing weakness and chest discomfort over last two days.  On admission CXR showed possible left sided PNA, K+3.0, WBC 17 (started on empiric ABX).  MRI was obtained while hospitalized showing a punctate acute right parietal infarct. Neurology was consulted for findings.    Currently patient continues to complain of generalized weakness and chest discomfort but no localizing or lateralizing symptoms. She is alert and oriented and following commands appropriately.   Pateint is on home ASA but has not been taking it for the past two weeks  ECG NSR  Date last known well: Unable to determine Time last known well: Unable to determine tPA Given: No: out of the window  Past Medical History  Diagnosis Date  . ANXIETY 08/16/2008  . CAD, UNSPECIFIED SITE 10/17/2008  . CHOLELITHIASIS 08/23/2008  . COPD 01/04/2010    FeV1 101%, DLCO64% 2008  . 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 DIVERTICULOSIS-COLON 04/07/2008  . GERD 04/19/2007  . HYPERSOMNIA, ASSOCIATED WITH SLEEP APNEA 02/01/2007  . HYPERTENSION 11/12/2006  . MITRAL VALVE PROLAPSE, HX OF 10/07/2008  . NEOPLASM, MALIGNANT, BREAST, RIGHT 02/15/2008  . OSTEOPOROSIS 04/04/2008  . Raynaud's syndrome 04/25/2009  . Airway obstruction   . History of diverticulitis of colon   . Tortuous colon   . Anal stricture   . Chronic constipation   . Fatty liver   . Hiatal hernia   . Esophageal stricture   . IBS (irritable bowel syndrome)   . Pulmonary fibrosis   . Sleep apnea   . COPD (chronic obstructive pulmonary disease)   . Colitis     sigmoid  . Candida esophagitis   . Diverticulitis   . Hearing aid worn     bilateral  . Atrial fibrillation     she denies known hx of  afib 02/24/13  . Complication of anesthesia     difficult intubation    Past Surgical History  Procedure Laterality Date  . Abdominal hysterectomy    . Bilateral salpingoophorectomy      s/p prolapsed bladder  . Bladder surgery      prolapsed  . Thoracotomy      granuloma, pulmonary-thoracotomy  . Breast surgery  07-21-07    mastectomy (right), all margins neg and LNs neg   . Appendectomy    . Tonsillectomy    . Rotator cuff repair    . Mastectomy      right  . Breast cyst excision      left  . Mouth surgery    . Eye surgery  cataract removal bilateral  . Melanoma excision      rt clavicle Dr [facility] office)  . Lung surgery      Family History  Problem Relation Age of Onset  . Parkinsonism Father   . Coronary artery disease Father   . Breast cancer      aunt  . Colon cancer      aunt, age 68  . Uterine cancer      aunt  . Stroke Mother   . Heart failure Mother   . Cancer Maternal Aunt     breast   Social History:  reports that she quit smoking about 30 years ago. Her smoking use included Cigarettes. She has a 60 pack-year smoking history. She has never used smokeless tobacco. She reports that she drinks alcohol. She reports that she does not use illicit drugs.  Allergies:  Allergies  Allergen Reactions  . Azithromycin Hives, Diarrhea, Dermatitis and Rash  . Ciprofloxacin Swelling  . Levaquin [Levofloxacin] Hives  . Penicillins Hives    has had mild doses that she did not have reactions to  . Prednisone     Unknown   . Sulfamethoxazole Nausea And Vomiting    Medications:                                                                                                                           Prior to Admission:  Prescriptions prior to admission  Medication Sig Dispense Refill  . acetaminophen (TYLENOL) 325 MG tablet Take 650 mg by mouth every 6 (six) hours as needed for mild pain.      Marland Kitchen amLODipine (NORVASC) 5 MG tablet Take 1 tablet (5 mg total)  by mouth daily.  90 tablet  3  . aspirin 325 MG tablet Take 325 mg by mouth daily.      . cefUROXime (CEFTIN) 250 MG tablet Take 1 tablet (250 mg total) by mouth 2 (two) times daily with a meal.  10 tablet  0  . diazepam (VALIUM) 5 MG tablet Take 1 tablet (5 mg total) by mouth at bedtime.  30 tablet  3  . hydrOXYzine (ATARAX/VISTARIL) 10 MG tablet Take 10 mg by mouth every 4 (four) hours as needed for itching.      . nitrofurantoin, macrocrystal-monohydrate, (MACROBID) 100 MG capsule Take 1 capsule (100 mg total) by mouth 2 (two) times daily. For 5 days  10 capsule  0  . omeprazole (PRILOSEC) 20 MG capsule Take 20 mg by mouth daily.      . polyethylene glycol (MIRALAX / GLYCOLAX) packet Take 17 g by mouth daily as needed (for constipation).      . AMBULATORY NON FORMULARY MEDICATION Oxygen at bedtime-2 or 2 1/2 liters      . nitroGLYCERIN (NITROSTAT) 0.4 MG SL tablet Place 0.4 mg under the tongue every 5 (five) minutes as needed for chest pain.       Scheduled: . amLODipine  5 mg Oral Daily  .  antiseptic oral rinse  15 mL Mouth Rinse BID  . aspirin  325 mg Oral Daily  . aztreonam  1 g Intravenous 3 times per day  . enoxaparin (LOVENOX) injection  40 mg Subcutaneous Q24H  . ipratropium-albuterol  3 mL Nebulization Q6H WA  . pantoprazole  40 mg Oral Daily  . sodium chloride  3 mL Intravenous Q12H  . vancomycin  750 mg Intravenous Q24H   Continuous:  [id number], acetaminophen, albuterol, diazepam, feeding supplement (ENSURE COMPLETE), nitroGLYCERIN, ondansetron (ZOFRAN) IV, ondansetron, polyethylene glycol  ROS:                                                                                                                                       History obtained from the patient  General ROS: negative for - chills, fatigue, fever, night sweats, weight gain or weight loss Psychological ROS: negative for - behavioral disorder, hallucinations, memory difficulties, mood swings or suicidal  ideation Ophthalmic ROS: negative for - blurry vision, double vision, eye pain or loss of vision ENT ROS: negative for - epistaxis, nasal discharge, oral lesions, sore throat, tinnitus or vertigo Allergy and Immunology ROS: negative for - hives or itchy/watery eyes Hematological and Lymphatic ROS: negative for - bleeding problems, bruising or swollen lymph nodes Endocrine ROS: negative for - galactorrhea, hair pattern changes, polydipsia/polyuria or temperature intolerance Respiratory ROS: negative for - cough, hemoptysis, shortness of breath or wheezing Cardiovascular ROS: negative for - chest pain, dyspnea on exertion, edema or irregular heartbeat Gastrointestinal ROS: negative for - abdominal pain, diarrhea, hematemesis, nausea/vomiting or stool incontinence Genito-Urinary ROS: negative for - dysuria, hematuria, incontinence or urinary frequency/urgency Musculoskeletal ROS: negative for - joint swelling or muscular weakness Neurological ROS: as noted in HPI Dermatological ROS: negative for rash and skin lesion changes  Neurologic Examination:                                                                                                      Blood pressure 106/46, pulse 69, temperature 98.3 F (36.8 C), temperature source Oral, resp. rate 18, height 5\' 1"  (1.549 m), weight 52.8 kg (116 lb 6.5 oz), SpO2 96.00%.   Mental Status: Alert, oriented to date and year but has some difficulty with naming hospital, thought content appropriate.  Speech fluent without evidence of aphasia.  Able to follow 3 step commands without difficulty. Cranial Nerves: II: Discs flat bilaterally; Visual fields grossly normal, pupils equal, round, reactive to light and accommodation III,IV, VI: ptosis not present,  extra-ocular motions intact bilaterally V,VII: smile symmetric, facial light touch sensation normal bilaterally VIII: hearing normal bilaterally IX,X: gag reflex present XI: bilateral shoulder  shrug XII: midline tongue extension without atrophy or fasciculations  Motor: Right : Upper extremity   5/5    Left:     Upper extremity   5/5  Lower extremity   5/5     Lower extremity   5/5 Tone and bulk:normal tone throughout; no atrophy noted Sensory: Pinprick and light touch intact throughout, with distal bilateral neuropathy in legs.  Deep Tendon Reflexes:  Right: Upper Extremity   Left: Upper extremity   biceps (C-5 to C-6) 2/4   biceps (C-5 to C-6) 2/4 tricep (C7) 2/4    triceps (C7) 2/4 Brachioradialis (C6) 2/4  Brachioradialis (C6) 2/4  Lower Extremity Lower Extremity  quadriceps (L-2 to L-4) 0/4   quadriceps (L-2 to L-4) 0/4 Achilles (S1) 0/4   Achilles (S1) 0/4  Plantars: Right: downgoing   Left: downgoing Cerebellar: normal finger-to-nose,  normal heel-to-shin test Gait: not tested CV: pulses palpable throughout    Lab Results: Basic Metabolic Panel:  Recent Labs Lab 07/24/13 1910 07/24/13 2025 07/25/13 0355 07/26/13 0425  NA 135* 139 139 136*  K 3.3* 3.0* 3.1* 4.6  CL 95* 98 104 101  CO2 27  --  26 27  GLUCOSE 139* 104* 106* 93  BUN 19 17 14 10   CREATININE 0.66 0.70 0.59 0.62  CALCIUM 9.2  --  7.5* 8.2*    Liver Function Tests:  Recent Labs Lab 07/24/13 1910 07/25/13 0355  AST 17 12  ALT 8 7  ALKPHOS 64 46  BILITOT 0.5 0.4  PROT 6.5 5.0*  ALBUMIN 3.1* 2.4*   No results found for this basename: LIPASE, AMYLASE,  in the last 168 hours No results found for this basename: AMMONIA,  in the last 168 hours  CBC:  Recent Labs Lab 07/24/13 1910 07/24/13 2025 07/25/13 0355 07/26/13 0425  WBC 22.7*  --  16.6* 9.5  NEUTROABS 20.9*  --  14.5*  --   HGB 12.6 12.6 11.3* 11.1*  HCT 37.4 37.0 34.7* 34.2*  MCV 87.4  --  88.1 88.4  PLT 245  --  242 224    Cardiac Enzymes:  Recent Labs Lab 07/24/13 2153  TROPONINI <0.30    Lipid Panel: No results found for this basename: CHOL, TRIG, HDL, CHOLHDL, VLDL, LDLCALC,  in the last 168  hours  CBG: No results found for this basename: GLUCAP,  in the last 168 hours  Microbiology: Results for orders placed during the hospital encounter of 07/24/13  URINE CULTURE     Status: None   Collection Time    07/24/13  6:56 PM      Result Value Ref Range Status   Specimen Description URINE, CATHETERIZED   Final   Special Requests NONE   Final   Culture  Setup Time     Final   Value: 07/25/2013 03:15     Performed at [facility]     Final   Value: NO GROWTH     Performed at [facility]   Culture     Final   Value: NO GROWTH     Performed at [facility]   Report Status 07/25/2013 FINAL   Final  CULTURE, BLOOD (ROUTINE X 2)     Status: None   Collection Time    07/24/13  7:10 PM  Result Value Ref Range Status   Specimen Description BLOOD LEFT ARM   Final   Special Requests BOTTLES DRAWN AEROBIC AND ANAEROBIC 4CC EACH   Final   Culture  Setup Time     Final   Value: 07/25/2013 03:10     Performed at [facility]   Culture     Final   Value:        BLOOD CULTURE RECEIVED NO GROWTH TO DATE CULTURE WILL BE HELD FOR 5 DAYS BEFORE ISSUING A FINAL NEGATIVE REPORT     Performed at [facility]   Report Status PENDING   Incomplete  CULTURE, BLOOD (ROUTINE X 2)     Status: None   Collection Time    07/24/13  8:16 PM      Result Value Ref Range Status   Specimen Description BLOOD LEFT ARM   Final   Special Requests BOTTLES DRAWN AEROBIC AND ANAEROBIC [facility] EACH   Final   Culture  Setup Time     Final   Value: 07/25/2013 03:10     Performed at [facility]   Culture     Final   Value:        BLOOD CULTURE RECEIVED NO GROWTH TO DATE CULTURE WILL BE HELD FOR 5 DAYS BEFORE ISSUING A FINAL NEGATIVE REPORT     Performed at [facility]   Report Status PENDING   Incomplete    Coagulation Studies: No results found for this basename: LABPROT, INR,  in the last 72 hours  Imaging: Dg Chest 2  View  07/24/2013   CLINICAL DATA:  Hypoxia.  Shortness of breath.  EXAM: CHEST  2 VIEW  COMPARISON:  DG CHEST 2 VIEW dated 07/16/2013  FINDINGS: Mediastinum and hilar structures are normal. Cardiomegaly noted. Bilateral pulmonary interstitial prominence noted. Congestive heart failure interstitial edema could present in this fashion. Pneumonitis cannot be excluded. Pleural parenchymal scarring again noted on the left. Left pleural effusion noted. No pneumothorax. Surgical clips right axilla. Degenerative changes and scoliosis thoracic spine.  IMPRESSION: Cardiomegaly with bilateral interstitial prominence and left pleural effusion. Congestive heart failure from interstitial edema could present this fashion. Pneumonitis cannot be excluded   Electronically Signed   By: Marcello Moores  Register   On: 07/24/2013 19:49   Ct Angio Chest Pe W/cm &/or Wo Cm  07/24/2013   CLINICAL DATA:  Shortness of breath, nausea and vomiting.  EXAM: CT ANGIOGRAPHY CHEST  CT ABDOMEN AND PELVIS WITH CONTRAST  TECHNIQUE: Multidetector CT imaging of the chest was performed using the standard protocol during bolus administration of intravenous contrast. Multiplanar CT image reconstructions and MIPs were obtained to evaluate the vascular anatomy. Multidetector CT imaging of the abdomen and pelvis was performed using the standard protocol during bolus administration of intravenous contrast.  CONTRAST:  50 mL OMNIPAQUE IOHEXOL 300 MG/ML SOLN, 100 mL OMNIPAQUE IOHEXOL 350 MG/ML SOLN  COMPARISON:  CT chest 07/08/2013.  CT abdomen and pelvis 01/04/2013.  FINDINGS: CTA CHEST FINDINGS  No pulmonary embolus is identified. Extensive aortoiliac atherosclerosis is noted. There is cardiomegaly. Trace bilateral pleural effusions are present. No pericardial effusion. No axillary, hilar or mediastinal lymphadenopathy. Lungs again show centrilobular emphysematous disease. Calcified granuloma on the left is unchanged. Dependent atelectasis is noted. Linear  parenchymal scarring left upper lobe is also noted.  CT ABDOMEN and PELVIS FINDINGS  Liver, spleen and adrenal glands appear normal. The pancreas is atrophic but otherwise unremarkable. Bilateral parapelvic renal cysts are noted. Aortoiliac atherosclerosis without  aneurysm is present. The patient has extensive diverticular disease, worst in the sigmoid colon, without diverticulitis. The colon is otherwise unremarkable. The stomach and small bowel appear normal. No lymphadenopathy or fluid. No lytic or sclerotic bony lesion is identified.  Review of the MIP images confirms the above findings.  IMPRESSION: Negative for pulmonary embolus. No acute finding chest, abdomen or pelvis.  Atherosclerotic vascular disease.  Old granulomatous disease in the chest.  Emphysema.  Cardiomegaly.  Extensive diverticulosis without diverticulitis.   Electronically Signed   By: Inge Rise M.D.   On: 07/24/2013 23:54   Mr Jeri Cos [id number] Contrast  07/26/2013   CLINICAL DATA:  Slurred speech. History of breast cancer and melanoma.  EXAM: MRI HEAD WITHOUT AND WITH CONTRAST  TECHNIQUE: Multiplanar, multiecho pulse sequences of the brain and surrounding structures were obtained without and with intravenous contrast.  CONTRAST:  44mL MULTIHANCE GADOBENATE DIMEGLUMINE 529 MG/ML IV SOLN  COMPARISON:  Head CT 03/30/13  FINDINGS: There is a 5 mm acute infarct in the inferior right parietal lobe (series 4, image 19). There is no evidence of intracranial hemorrhage. Incidental note is made of a cavum septum pellucidum et vergae. Patchy and confluent T2 hyperintensities in the cerebral white matter bilaterally are nonspecific but compatible with moderate chronic small vessel ischemic disease. There is moderate cerebral atrophy. There is no evidence of mass, midline shift, or extra-axial fluid collection. No abnormal enhancement is identified.  There are large bilateral mastoid effusions. Major intracranial vascular flow voids are preserved.  Prior bilateral cataract surgery is noted. Paranasal sinuses are clear.  IMPRESSION: 1. Punctate acute infarct in the right parietal lobe. 2. Moderate chronic small vessel ischemic disease. 3. Bilateral mastoid effusions.   Electronically Signed   By: Logan Bores   On: 07/26/2013 08:12   Ct Abdomen Pelvis W Contrast  07/24/2013   CLINICAL DATA:  Shortness of breath, nausea and vomiting.  EXAM: CT ANGIOGRAPHY CHEST  CT ABDOMEN AND PELVIS WITH CONTRAST  TECHNIQUE: Multidetector CT imaging of the chest was performed using the standard protocol during bolus administration of intravenous contrast. Multiplanar CT image reconstructions and MIPs were obtained to evaluate the vascular anatomy. Multidetector CT imaging of the abdomen and pelvis was performed using the standard protocol during bolus administration of intravenous contrast.  CONTRAST:  50 mL OMNIPAQUE IOHEXOL 300 MG/ML SOLN, 100 mL OMNIPAQUE IOHEXOL 350 MG/ML SOLN  COMPARISON:  CT chest 07/08/2013.  CT abdomen and pelvis 01/04/2013.  FINDINGS: CTA CHEST FINDINGS  No pulmonary embolus is identified. Extensive aortoiliac atherosclerosis is noted. There is cardiomegaly. Trace bilateral pleural effusions are present. No pericardial effusion. No axillary, hilar or mediastinal lymphadenopathy. Lungs again show centrilobular emphysematous disease. Calcified granuloma on the left is unchanged. Dependent atelectasis is noted. Linear parenchymal scarring left upper lobe is also noted.  CT ABDOMEN and PELVIS FINDINGS  Liver, spleen and adrenal glands appear normal. The pancreas is atrophic but otherwise unremarkable. Bilateral parapelvic renal cysts are noted. Aortoiliac atherosclerosis without aneurysm is present. The patient has extensive diverticular disease, worst in the sigmoid colon, without diverticulitis. The colon is otherwise unremarkable. The stomach and small bowel appear normal. No lymphadenopathy or fluid. No lytic or sclerotic bony lesion is identified.   Review of the MIP images confirms the above findings.  IMPRESSION: Negative for pulmonary embolus. No acute finding chest, abdomen or pelvis.  Atherosclerotic vascular disease.  Old granulomatous disease in the chest.  Emphysema.  Cardiomegaly.  Extensive diverticulosis without diverticulitis.   Electronically Signed  By: Inge Rise M.D.   On: 07/24/2013 23:54    Etta Quill PA-C [facility] [phone]  07/26/2013, 10:29 AM   Patient seen and examined.  Clinical course and management discussed.  Necessary edits performed.  I agree with the above.  Assessment and plan of care developed and discussed below.    Assessment: 80 y.o. female presenting with generalized weakness and some mental status changes.  Work up included an MRI of the brain.  MRI reviewed and shows a small right parietal lobe infarct.  Echocardiogram performed and unremarkable.  Carotid dopplers are pending.    Stroke Risk Factors - atrial fibrillation, hypertension and CAD  Recommendations: 1. HgbA1c, fasting lipid panel 2. PT consult, OT consult, Speech consult 3. Carotid dopplers 4. Prophylactic therapy-Restart ASA at 325mg  daily 5. Risk factor modification 6. Telemetry monitoring 7. Frequent neuro checks  Alexis Goodell, MD [facility] [phone]  07/26/2013  5:45 PM

## 2013-07-26 NOTE — Progress Notes (Signed)
Contact Isolation discontinued, C. Diff ruled out after no BM for >24 hrs.

## 2013-07-26 NOTE — Progress Notes (Signed)
Patient has a bed at [facility]. Anticipating possible discharge tomorrow.   Clinical Social Work Department CLINICAL SOCIAL WORK PLACEMENT NOTE 07/26/2013  Patient:  [name] [name], [name] [name]  Account Number:  [id number] Admit date:  07/24/2013  Clinical Social Worker:  Renold Genta  Date/time:  07/26/2013 03:14 PM  Clinical Social Work is seeking post-discharge placement for this patient at the following level of care:   SKILLED NURSING   (*CSW will update this form in Epic as items are completed)   07/26/2013  Patient/family provided with [place] Department of Clinical Social Work's list of facilities offering this level of care within the geographic area requested by the patient (or if unable, by the patient's family).  07/26/2013  Patient/family informed of their freedom to choose among providers that offer the needed level of care, that participate in Medicare, Medicaid or managed care program needed by the patient, have an available bed and are willing to accept the patient.  07/26/2013  Patient/family informed of [facility]' ownership interest in [facility], as well as of the fact that they are under no obligation to receive care at this facility.  PASARR submitted to EDS on 07/26/2013 PASARR number received from EDS on 07/26/2013  FL2 transmitted to all facilities in geographic area requested by pt/family on  07/26/2013 FL2 transmitted to all facilities within larger geographic area on   Patient informed that his/her managed care company has contracts with or will negotiate with  certain facilities, including the following:     Patient/family informed of bed offers received:  07/26/2013 Patient chooses bed at [place] Physician recommends and patient chooses bed at    Patient to be transferred to [place] on   Patient to be transferred to facility by   The following physician request were entered in Epic:   Additional Comments:   [name], [place] Worker cell #: [phone]

## 2013-07-27 DIAGNOSIS — J449 Chronic obstructive pulmonary disease, unspecified: Secondary | ICD-10-CM | POA: Diagnosis not present

## 2013-07-27 DIAGNOSIS — J189 Pneumonia, unspecified organism: Secondary | ICD-10-CM | POA: Diagnosis not present

## 2013-07-27 DIAGNOSIS — I6990 Unspecified sequelae of unspecified cerebrovascular disease: Secondary | ICD-10-CM | POA: Diagnosis not present

## 2013-07-27 DIAGNOSIS — R279 Unspecified lack of coordination: Secondary | ICD-10-CM | POA: Diagnosis not present

## 2013-07-27 DIAGNOSIS — M6281 Muscle weakness (generalized): Secondary | ICD-10-CM | POA: Diagnosis not present

## 2013-07-27 DIAGNOSIS — I10 Essential (primary) hypertension: Secondary | ICD-10-CM | POA: Diagnosis not present

## 2013-07-27 DIAGNOSIS — I6350 Cerebral infarction due to unspecified occlusion or stenosis of unspecified cerebral artery: Secondary | ICD-10-CM | POA: Diagnosis not present

## 2013-07-27 DIAGNOSIS — R079 Chest pain, unspecified: Secondary | ICD-10-CM | POA: Diagnosis not present

## 2013-07-27 DIAGNOSIS — K219 Gastro-esophageal reflux disease without esophagitis: Secondary | ICD-10-CM | POA: Diagnosis not present

## 2013-07-27 DIAGNOSIS — J438 Other emphysema: Secondary | ICD-10-CM | POA: Diagnosis not present

## 2013-07-27 DIAGNOSIS — D72829 Elevated white blood cell count, unspecified: Secondary | ICD-10-CM | POA: Diagnosis not present

## 2013-07-27 DIAGNOSIS — R918 Other nonspecific abnormal finding of lung field: Secondary | ICD-10-CM | POA: Diagnosis not present

## 2013-07-27 DIAGNOSIS — R262 Difficulty in walking, not elsewhere classified: Secondary | ICD-10-CM | POA: Diagnosis not present

## 2013-07-27 DIAGNOSIS — K2100 Gastro-esophageal reflux disease with esophagitis, without bleeding: Secondary | ICD-10-CM | POA: Diagnosis not present

## 2013-07-27 DIAGNOSIS — R6510 Systemic inflammatory response syndrome (SIRS) of non-infectious origin without acute organ dysfunction: Secondary | ICD-10-CM | POA: Diagnosis not present

## 2013-07-27 DIAGNOSIS — I2510 Atherosclerotic heart disease of native coronary artery without angina pectoris: Secondary | ICD-10-CM | POA: Diagnosis not present

## 2013-07-27 DIAGNOSIS — F411 Generalized anxiety disorder: Secondary | ICD-10-CM | POA: Diagnosis not present

## 2013-07-27 DIAGNOSIS — J209 Acute bronchitis, unspecified: Secondary | ICD-10-CM | POA: Diagnosis not present

## 2013-07-27 LAB — LIPID PANEL
Cholesterol: 126 mg/dL (ref 0–200)
HDL: 25 mg/dL — ABNORMAL LOW (ref >39)
LDL Cholesterol: 84 mg/dL (ref 0–99)
Total CHOL/HDL Ratio: 5 RATIO
Triglycerides: 87 mg/dL (ref <150)
VLDL: 17 mg/dL (ref 0–40)

## 2013-07-27 LAB — HEMOGLOBIN A1C
Hgb A1c MFr Bld: 5.9 % — ABNORMAL HIGH (ref <5.7)
Mean Plasma Glucose: 123 mg/dL — ABNORMAL HIGH (ref <117)

## 2013-07-27 MED ORDER — POLYETHYLENE GLYCOL 3350 17 G PO PACK: 17.0000 g | PACK | Freq: Every day | ORAL | Status: DC

## 2013-07-27 MED ORDER — POLYETHYLENE GLYCOL 3350 17 G PO PACK
17.0000 g | PACK | Freq: Every day | ORAL | Status: DC
  Filled 2013-07-27: qty 1

## 2013-07-27 MED ORDER — DOXYCYCLINE HYCLATE 100 MG PO TABS: 100.0000 mg | ORAL_TABLET | Freq: Two times a day (BID) | ORAL | Status: DC

## 2013-07-27 MED ORDER — SENNA 8.6 MG PO TABS: 1.0000 | ORAL_TABLET | Freq: Every day | ORAL | Status: DC

## 2013-07-27 MED ORDER — DIAZEPAM 5 MG PO TABS: 2.5000 mg | ORAL_TABLET | Freq: Every day | ORAL | Status: DC

## 2013-07-27 MED ORDER — ENSURE COMPLETE PO LIQD: 237.0000 mL | Freq: Two times a day (BID) | ORAL | Status: DC | PRN

## 2013-07-27 MED ORDER — ALBUTEROL SULFATE (2.5 MG/3ML) 0.083% IN NEBU: 2.5000 mg | INHALATION_SOLUTION | Freq: Four times a day (QID) | RESPIRATORY_TRACT | Status: DC | PRN

## 2013-07-27 NOTE — Progress Notes (Signed)
Spoke with patient's daughter, Gaynelle Adu, prior to discharge. Explained [facility] services. Consents obtained. [name] reports patient will be at [facility] for short term. She states she will call [facility] once patient discharges home. Explained that patient will receive transition of care calls and will be evaluated for monthly home visits. Harrington Challenger the [facility] packet with contact information. Inpatient RN case manager aware. Marthenia Rolling, MSN- [facility] [phone]

## 2013-07-27 NOTE — Progress Notes (Signed)
VASCULAR LAB PRELIMINARY  PRELIMINARY  PRELIMINARY  PRELIMINARY  Carotid duplex completed.    Preliminary report:  Bilateral:  1-39% ICA stenosis.  Vertebral artery flow is antegrade.     Maleyah Evans, RVS 07/27/2013, 9:13 AM

## 2013-07-27 NOTE — Progress Notes (Signed)
Discharge to [facility], daughter Tye [place] to transport patient. D/c papers  Was given to the daughter by CSW. Stroke Materials given and discussed with daughter. PIV removed no s/s of infiltration or swelling.

## 2013-07-27 NOTE — Progress Notes (Signed)
Patient is set to discharge to [facility] today. Patient & daughter at bedside - daughter to transport to SNF. Discharge packet given to RN, Shirlee Limerick.   Clinical Social Work Department CLINICAL SOCIAL WORK PLACEMENT NOTE 07/27/2013  Patient:  TIYE, HUWE  Account Number:  [id number] Admit date:  07/24/2013  Clinical Social Worker:  Renold Genta  Date/time:  07/26/2013 03:14 PM  Clinical Social Work is seeking post-discharge placement for this patient at the following level of care:   SKILLED NURSING   (*CSW will update this form in Epic as items are completed)   07/26/2013  Patient/family provided with [place] Department of Clinical Social Work's list of facilities offering this level of care within the geographic area requested by the patient (or if unable, by the patient's family).  07/26/2013  Patient/family informed of their freedom to choose among providers that offer the needed level of care, that participate in Medicare, Medicaid or managed care program needed by the patient, have an available bed and are willing to accept the patient.  07/26/2013  Patient/family informed of [facility]' ownership interest in [facility], as well as of the fact that they are under no obligation to receive care at this facility.  PASARR submitted to EDS on 07/26/2013 PASARR number received from EDS on 07/26/2013  FL2 transmitted to all facilities in geographic area requested by pt/family on  07/26/2013 FL2 transmitted to all facilities within larger geographic area on   Patient informed that his/her managed care company has contracts with or will negotiate with  certain facilities, including the following:     Patient/family informed of bed offers received:  07/26/2013 Patient chooses bed at [place] Physician recommends and patient chooses bed at    Patient to be transferred to [place] on  07/27/2013 Patient to be transferred to facility by daughter's  car  The following physician request were entered in Epic:   Additional Comments:   [name], [place] Worker cell #: [phone]

## 2013-07-27 NOTE — Discharge Summary (Signed)
Physician Discharge Summary  [name] [name] [id number] DOB: [birth year] DOA: 07/24/2013  PCP: Chancy Hurter, MD  Admit date: 07/24/2013 Discharge date: 07/27/2013  Time spent: Greater than 30 minutes  Recommendations for Outpatient Follow-up:  1. MD at SNF in 5 days with repeat labs (CBC and BMP). Please followup hemoglobin A1c and final blood culture results that were sent from the hospital. 2. Recommend repeat chest x-ray in 4-6 weeks 3. Dr. Phoebe Sharps, PCP after discharge from SNF. 4. Dr. Asencion Noble, pulmonology: Patient has prior appointment on 07/29/13 at 10:45 AM  Discharge Diagnoses:  Principal Problem:   SIRS (systemic inflammatory response syndrome) Active Problems:   HYPERTENSION   COPD with emphysema, gold stage A   GERD   Leukocytosis, unspecified   Pneumonia   Discharge Condition: Improved & Stable  Diet recommendation: Heart healthy diet.  Filed Weights   07/24/13 2055 07/24/13 2113  Weight: 54.8 kg (120 lb 13 oz) 52.8 kg (116 lb 6.5 oz)    History of present illness:  80 year old female patient with history of COPD, chronic respiratory failure on nightly oxygen, hypertension, listed A. fib, breast cancer, recently admitted to the hospital twice over the last 2 weeks for fever-first time for possible pneumonia and second time for UTI, readmitted on 3/15 with increasing weakness and chest tightness. In the ED, patient had temperature 100.79F, WBC 22.7and chest x-ray could not exclude pneumonitis. She was assessed as SIRS with possible pneumonic process and started empirically on IV vancomycin and aztreonam.   Hospital Course:   SIRS  - source not clear? Acute bronchitis  - Urine cultures: Negative. Blood cultures x2: Negative to date  - CTA chest and CT abdomen and pelvis: Without acute findings & no PE.  - No history of diarrhea. Patient actually constipated and has not had a BM in 3 days. - Influenza panel PCR negative, a recent HIV antibody test:  Negative, recent urinary Legionella and streptococcal antigen negative  - Patient was empirically placed on IV vancomycin and aztreonam. However since no clear source was been identified, DC'ed IV antibiotics 3/17 and started on oral doxycycline. Patient has remained afebrile. If patient continues to spike fevers, recommend infectious disease consultation and ? Evaluate for noninfectious causes of fever. We'll complete total 5 days course of oral doxycycline.  Acute CVA/right parietal lobe infarct  - Completed stroke workup: carotid Dopplers (bilateral: 1-39% ICA stenosis. Vertebral artery flow is antegrade), MRA brain without contrast: Negative, LDL 84 and hemoglobin A1c is pending. Neurology was consulted and have seen patient today and recommend continued aspirin 325 mg daily which patient had stopped taking for 2 weeks prior to admission thinking that she was allergic to it while she actually was not and recommend hemoglobin A1c <7 if elevated. Recent echo showed normal EF-will not repeat. Patient will be going to skilled nursing facility for rehabilitation.  Hypertension - continue present medications. Controlled.   COPD/chronic respiratory failure on nightly oxygen - presently not wheezing. Patient has been placed on nebulizers. Stable   Leukocytosis - see #1. Resolved   Anemia: Likely secondary to acute illness. Stable   Hypokalemia: Replaced   Drowsiness: Present on admission but nonfocal exam. The daughter stated that patient takes Valium 5 mg daily at bedtime-reduced to 2.5 mg at bedtime and did not stop abruptly or change to when necessary to avoid withdrawal-related side effects i.e. seizures. MRI brain showed incidental CVA. Seems to have resolved.  History of breast cancer: Patient complained of some intermittent right-sided chest wall/breast  pain ongoing for a month. No acute findings on breast exam. Advised to followup with her surgeon-has appointment soon.  Listed A. fib: Has  been in sinus rhythm in the hospital.  Pulmonary nodules by CT chest on 07/08/13: Outpatient followup as deemed necessary.   Consultations:  Neurology  Procedures:  None    Discharge Exam:  Complaints:  "I feel fine". Urinary incontinence-long-standing problem. Right-sided breast/chest wall pain that is tender to touch-ongoing for last 1 month. Denies dyspnea or cough. Anxious to get out of the hospital.  Filed Vitals:   07/26/13 2005 07/26/13 2134 07/27/13 0439 07/27/13 1007  BP:  126/81 121/46   Pulse: 70 81 69   Temp:  98.3 F (36.8 C) 98.1 F (36.7 C)   TempSrc:  Oral Oral   Resp: 16 18 16    Height:      Weight:      SpO2: 94% 95% 95% 89%    General: NAD. Elderly frail pleasant female sitting comfortably in bed.  Cardiovascular: regular rate and rhythm, without MRG. Telemetry: Sinus rhythm with frequent PACs.  Respiratory: Slightly reduced breath sounds in the bases but otherwise clear to auscultation. No increased work of breathing.  Abdomen: soft, not tender to palpation, positive bowel sounds  MSK: no peripheral edema  Neuro: Alert and oriented x3 without focal deficits. No pronator sign.  Extremities: Symmetrical 5 x 5 power. Breast exam: Right breast with mild diffuse nodularity but no predominant mass or lump felt. No other acute findings. Daughter in room on exam.  Discharge Instructions      Discharge Orders   Future Appointments Provider Department Dept Phone   07/29/2013 10:45 AM Elsie Stain, MD [facility] [phone]   Future Orders Complete By Expires   Call MD for:  difficulty breathing, headache or visual disturbances  As directed    Call MD for:  extreme fatigue  As directed    Call MD for:  persistant dizziness or light-headedness  As directed    Call MD for:  severe uncontrolled pain  As directed    Call MD for:  temperature >100.4  As directed    Diet - low sodium heart healthy  As directed    Increase activity slowly  As  directed        Medication List    STOP taking these medications       cefUROXime 250 MG tablet  Commonly known as:  CEFTIN     hydrOXYzine 10 MG tablet  Commonly known as:  ATARAX/VISTARIL     nitrofurantoin (macrocrystal-monohydrate) 100 MG capsule  Commonly known as:  MACROBID      TAKE these medications       acetaminophen 325 MG tablet  Commonly known as:  TYLENOL  Take 650 mg by mouth every 6 (six) hours as needed for mild pain.     albuterol (2.5 MG/3ML) 0.083% nebulizer solution  Commonly known as:  PROVENTIL  Take 3 mLs (2.5 mg total) by nebulization every 6 (six) hours as needed for wheezing or shortness of breath.     AMBULATORY NON FORMULARY MEDICATION  Oxygen at bedtime-2 or 2 1/2 liters     amLODipine 5 MG tablet  Commonly known as:  NORVASC  Take 1 tablet (5 mg total) by mouth daily.     aspirin 325 MG tablet  Take 325 mg by mouth daily.     diazepam 5 MG tablet  Commonly known as:  VALIUM  Take 0.5 tablets (2.5  mg total) by mouth at bedtime.     doxycycline 100 MG tablet  Commonly known as:  VIBRA-TABS  Take 1 tablet (100 mg total) by mouth every 12 (twelve) hours. Discontinue after 07/31/2013 doses.     feeding supplement (ENSURE COMPLETE) Liqd  Take 237 mLs by mouth 3 times/day as needed-between meals & bedtime (Allow per pt or pt's family request).     nitroGLYCERIN 0.4 MG SL tablet  Commonly known as:  NITROSTAT  Place 0.4 mg under the tongue every 5 (five) minutes as needed for chest pain.     omeprazole 20 MG capsule  Commonly known as:  PRILOSEC  Take 20 mg by mouth daily.     polyethylene glycol packet  Commonly known as:  MIRALAX / GLYCOLAX  Take 17 g by mouth daily.     senna 8.6 MG Tabs tablet  Commonly known as:  SENOKOT  Take 1 tablet (8.6 mg total) by mouth daily.       Follow-up Information   Schedule an appointment as soon as possible for a visit with Chancy Hurter, MD.   Specialties:  Internal Medicine,  Radiology   Contact information:   [address] [place] [address] [phone]       Follow up with MD at SNF. Schedule an appointment as soon as possible for a visit in 5 days. (To be seen with repeat labs (CBC & BMP). Please follow up on HbA1C & final blood culture reseults sent from hospital)        The results of significant diagnostics from this hospitalization (including imaging, microbiology, ancillary and laboratory) are listed below for reference.    Significant Diagnostic Studies: Dg Chest 2 View  07/24/2013   CLINICAL DATA:  Hypoxia.  Shortness of breath.  EXAM: CHEST  2 VIEW  COMPARISON:  DG CHEST 2 VIEW dated 07/16/2013  FINDINGS: Mediastinum and hilar structures are normal. Cardiomegaly noted. Bilateral pulmonary interstitial prominence noted. Congestive heart failure interstitial edema could present in this fashion. Pneumonitis cannot be excluded. Pleural parenchymal scarring again noted on the left. Left pleural effusion noted. No pneumothorax. Surgical clips right axilla. Degenerative changes and scoliosis thoracic spine.  IMPRESSION: Cardiomegaly with bilateral interstitial prominence and left pleural effusion. Congestive heart failure from interstitial edema could present this fashion. Pneumonitis cannot be excluded   Electronically Signed   By: Marcello Moores  Register   On: 07/24/2013 19:49   Dg Chest 2 View  07/16/2013   CLINICAL DATA:  Fever, weakness, cough, congestion.  EXAM: CHEST  2 VIEW  COMPARISON:  07/15/2013  FINDINGS: Scarring noted in the left lingula, stable. Heart is upper limits normal in size. Right lung is clear. No acute bony abnormality. Old left rib fractures.  IMPRESSION: Chronic changes.  No active disease.   Electronically Signed   By: Rolm Baptise M.D.   On: 07/16/2013 10:32   Dg Chest 2 View  07/15/2013   CLINICAL DATA:  Pneumonia, history of breast cancer  EXAM: CHEST  2 VIEW  COMPARISON:  07/13/2013  FINDINGS: Cardiomegaly again noted. Stable  postsurgical changes and volume loss in left lung. Surgical clips in right axilla again noted. No definite superimposed infiltrate or pulmonary edema. Stable mild infrahilar bronchitic changes.  IMPRESSION: Stable postsurgical changes and volume loss in left lung. Surgical clips in right axilla again noted. No definite superimposed infiltrate or pulmonary edema. Stable mild infrahilar bronchitic changes.   Electronically Signed   By: Lahoma Crocker M.D.   On: 07/15/2013  11:47   Ct Chest Wo Contrast  07/08/2013   CLINICAL DATA:  Shortness of breath, weakness, prior abnormal CT chest. History of right breast cancer.  EXAM: CT CHEST WITHOUT CONTRAST  TECHNIQUE: Multidetector CT imaging of the chest was performed following the standard protocol without IV contrast.  COMPARISON:  CT CHEST W/CM dated 03/14/2013; CT ABD-PEL WO/W CM dated 01/04/2013  FINDINGS: No pathologically enlarged mediastinal or axillary lymph nodes. Surgical clips are seen in the right axilla. Hilar regions are difficult to definitively evaluate without IV contrast but appear grossly unremarkable. Heart size within normal limits. Coronary artery calcification. No pericardial effusion.  Minimal biapical pleural parenchymal scarring. Moderate centrilobular emphysema. Mild changes of subpleural radiation fibrosis in the anterior right lung. Mild scarring in the right middle lobe. Granulomatous calcification in the left upper lobe with associated mild architectural distortion and volume loss. Scarring in the lingula. Interval regression of a subpleural opacity in the lateral aspect of the left lower lobe (image 39). There are a few scattered tiny pulmonary nodular densities measuring 3 mm or less in size. 3 mm right lower lobe nodule (example series 3, image 38) is unchanged from 01/04/2013. Note is made that comparison with 03/14/2013 is difficult due to expiratory phase imaging on that exam. Pleural fluid. Airway is unremarkable.  Incidental imaging of  the upper abdomen shows the visualized portions of the liver, adrenal glands, spleen, pancreas, stomach and bowel to be grossly unremarkable. No upper abdominal adenopathy. No worrisome lytic or sclerotic lesions. Degenerative changes are seen in the spine. Pectus deformity.  IMPRESSION: 1. No acute findings to explain the patient's shortness of breath. 2. Coronary artery calcification. 3. Few scattered tiny pulmonary nodular densities measuring 3 mm or less in size. Comparison with the prior examination is difficult due to expiratory phase imaging on that exam. Given risk factors for bronchogenic carcinoma, follow-up chest CT at 1 year is recommended. This recommendation follows the consensus statement: Guidelines for Management of Small Pulmonary Nodules Detected on CT Scans: A Statement from the [place] as published in Radiology 2005; 237:395-400.   Electronically Signed   By: Lorin Picket M.D.   On: 07/08/2013 14:50   Ct Angio Chest Pe W/cm &/or Wo Cm  07/24/2013   CLINICAL DATA:  Shortness of breath, nausea and vomiting.  EXAM: CT ANGIOGRAPHY CHEST  CT ABDOMEN AND PELVIS WITH CONTRAST  TECHNIQUE: Multidetector CT imaging of the chest was performed using the standard protocol during bolus administration of intravenous contrast. Multiplanar CT image reconstructions and MIPs were obtained to evaluate the vascular anatomy. Multidetector CT imaging of the abdomen and pelvis was performed using the standard protocol during bolus administration of intravenous contrast.  CONTRAST:  50 mL OMNIPAQUE IOHEXOL 300 MG/ML SOLN, 100 mL OMNIPAQUE IOHEXOL 350 MG/ML SOLN  COMPARISON:  CT chest 07/08/2013.  CT abdomen and pelvis 01/04/2013.  FINDINGS: CTA CHEST FINDINGS  No pulmonary embolus is identified. Extensive aortoiliac atherosclerosis is noted. There is cardiomegaly. Trace bilateral pleural effusions are present. No pericardial effusion. No axillary, hilar or mediastinal lymphadenopathy. Lungs again show  centrilobular emphysematous disease. Calcified granuloma on the left is unchanged. Dependent atelectasis is noted. Linear parenchymal scarring left upper lobe is also noted.  CT ABDOMEN and PELVIS FINDINGS  Liver, spleen and adrenal glands appear normal. The pancreas is atrophic but otherwise unremarkable. Bilateral parapelvic renal cysts are noted. Aortoiliac atherosclerosis without aneurysm is present. The patient has extensive diverticular disease, worst in the sigmoid colon, without diverticulitis. The colon is  otherwise unremarkable. The stomach and small bowel appear normal. No lymphadenopathy or fluid. No lytic or sclerotic bony lesion is identified.  Review of the MIP images confirms the above findings.  IMPRESSION: Negative for pulmonary embolus. No acute finding chest, abdomen or pelvis.  Atherosclerotic vascular disease.  Old granulomatous disease in the chest.  Emphysema.  Cardiomegaly.  Extensive diverticulosis without diverticulitis.   Electronically Signed   By: Inge Rise M.D.   On: 07/24/2013 23:54   Mr Jodene Nam Head Wo Contrast  07/26/2013   CLINICAL DATA:  Stroke  EXAM: MRA HEAD WITHOUT CONTRAST  TECHNIQUE: Angiographic images of the Circle of Willis were obtained using MRA technique without intravenous contrast.  COMPARISON:  MRI head 07/26/2013  FINDINGS: Both vertebral arteries are widely patent. Right PICA is patent. Left PICA not visualized. AICA is patent bilaterally. Superior cerebellar and posterior cerebral arteries are patent bilaterally without significant stenosis.  Internal carotid artery widely patent bilaterally. Anterior and middle cerebral arteries are widely patent bilaterally without significant stenosis.  Negative for cerebral aneurysm.  IMPRESSION: Negative   Electronically Signed   By: Franchot Gallo M.D.   On: 07/26/2013 17:32   Mr Jeri Cos [id number] Contrast  07/26/2013   CLINICAL DATA:  Slurred speech. History of breast cancer and melanoma.  EXAM: MRI HEAD WITHOUT AND WITH  CONTRAST  TECHNIQUE: Multiplanar, multiecho pulse sequences of the brain and surrounding structures were obtained without and with intravenous contrast.  CONTRAST:  64mL MULTIHANCE GADOBENATE DIMEGLUMINE 529 MG/ML IV SOLN  COMPARISON:  Head CT 03/30/13  FINDINGS: There is a 5 mm acute infarct in the inferior right parietal lobe (series 4, image 19). There is no evidence of intracranial hemorrhage. Incidental note is made of a cavum septum pellucidum et vergae. Patchy and confluent T2 hyperintensities in the cerebral white matter bilaterally are nonspecific but compatible with moderate chronic small vessel ischemic disease. There is moderate cerebral atrophy. There is no evidence of mass, midline shift, or extra-axial fluid collection. No abnormal enhancement is identified.  There are large bilateral mastoid effusions. Major intracranial vascular flow voids are preserved. Prior bilateral cataract surgery is noted. Paranasal sinuses are clear.  IMPRESSION: 1. Punctate acute infarct in the right parietal lobe. 2. Moderate chronic small vessel ischemic disease. 3. Bilateral mastoid effusions.   Electronically Signed   By: Logan Bores   On: 07/26/2013 08:12   Ct Abdomen Pelvis W Contrast  07/24/2013   CLINICAL DATA:  Shortness of breath, nausea and vomiting.  EXAM: CT ANGIOGRAPHY CHEST  CT ABDOMEN AND PELVIS WITH CONTRAST  TECHNIQUE: Multidetector CT imaging of the chest was performed using the standard protocol during bolus administration of intravenous contrast. Multiplanar CT image reconstructions and MIPs were obtained to evaluate the vascular anatomy. Multidetector CT imaging of the abdomen and pelvis was performed using the standard protocol during bolus administration of intravenous contrast.  CONTRAST:  50 mL OMNIPAQUE IOHEXOL 300 MG/ML SOLN, 100 mL OMNIPAQUE IOHEXOL 350 MG/ML SOLN  COMPARISON:  CT chest 07/08/2013.  CT abdomen and pelvis 01/04/2013.  FINDINGS: CTA CHEST FINDINGS  No pulmonary embolus is  identified. Extensive aortoiliac atherosclerosis is noted. There is cardiomegaly. Trace bilateral pleural effusions are present. No pericardial effusion. No axillary, hilar or mediastinal lymphadenopathy. Lungs again show centrilobular emphysematous disease. Calcified granuloma on the left is unchanged. Dependent atelectasis is noted. Linear parenchymal scarring left upper lobe is also noted.  CT ABDOMEN and PELVIS FINDINGS  Liver, spleen and adrenal glands appear normal. The pancreas is  atrophic but otherwise unremarkable. Bilateral parapelvic renal cysts are noted. Aortoiliac atherosclerosis without aneurysm is present. The patient has extensive diverticular disease, worst in the sigmoid colon, without diverticulitis. The colon is otherwise unremarkable. The stomach and small bowel appear normal. No lymphadenopathy or fluid. No lytic or sclerotic bony lesion is identified.  Review of the MIP images confirms the above findings.  IMPRESSION: Negative for pulmonary embolus. No acute finding chest, abdomen or pelvis.  Atherosclerotic vascular disease.  Old granulomatous disease in the chest.  Emphysema.  Cardiomegaly.  Extensive diverticulosis without diverticulitis.   Electronically Signed   By: Inge Rise M.D.   On: 07/24/2013 23:54   Dg Chest Port 1 View  07/13/2013   CLINICAL DATA:  FEVER COUGH FEVER COUGH  EXAM: PORTABLE CHEST - 1 VIEW  COMPARISON:  CT CHEST W/O CM dated 07/08/2013; DG CHEST 2 VIEW dated 03/30/2013  FINDINGS: Low lung volumes. The cardiac silhouette is enlarged. A atherosclerotic calcifications are identified within the aorta. Small to moderate left pleural effusions appreciated. There is diffuse thickening of the interstitial markings slightly more prominent when compared to previous study. This finding is accentuated by the low lung volumes. No further focal regions of consolidation appreciated. Remote rib fractures identified within the posterior lateral aspect of the left hemi thorax.  The bones are osteopenic.  IMPRESSION: Chronic bronchitic changes these findings accentuated by the patient's low lung volumes. Underlying component of mild pulmonary edema cannot be excluded.  Small to moderate left pleural effusion. A concomitant area of pulmonary infiltrate and/or atelectasis cannot be excluded. Surveillance evaluation with PA and lateral chest radiographs is recommended.   Electronically Signed   By: Margaree Mackintosh M.D.   On: 07/13/2013 15:30    Microbiology: Recent Results (from the past 240 hour(s))  URINE CULTURE     Status: None   Collection Time    07/24/13  6:56 PM      Result Value Ref Range Status   Specimen Description URINE, CATHETERIZED   Final   Special Requests NONE   Final   Culture  Setup Time     Final   Value: 07/25/2013 03:15     Performed at [facility]     Final   Value: NO GROWTH     Performed at [facility]   Culture     Final   Value: NO GROWTH     Performed at [facility]   Report Status 07/25/2013 FINAL   Final  CULTURE, BLOOD (ROUTINE X 2)     Status: None   Collection Time    07/24/13  7:10 PM      Result Value Ref Range Status   Specimen Description BLOOD LEFT ARM   Final   Special Requests BOTTLES DRAWN AEROBIC AND ANAEROBIC 4CC EACH   Final   Culture  Setup Time     Final   Value: 07/25/2013 03:10     Performed at [facility]   Culture     Final   Value:        BLOOD CULTURE RECEIVED NO GROWTH TO DATE CULTURE WILL BE HELD FOR 5 DAYS BEFORE ISSUING A FINAL NEGATIVE REPORT     Performed at [facility]   Report Status PENDING   Incomplete  CULTURE, BLOOD (ROUTINE X 2)     Status: None   Collection Time    07/24/13  8:16 PM      Result Value Ref  Range Status   Specimen Description BLOOD LEFT ARM   Final   Special Requests BOTTLES DRAWN AEROBIC AND ANAEROBIC [facility] EACH   Final   Culture  Setup Time     Final   Value: 07/25/2013 03:10     Performed at [facility]    Culture     Final   Value:        BLOOD CULTURE RECEIVED NO GROWTH TO DATE CULTURE WILL BE HELD FOR 5 DAYS BEFORE ISSUING A FINAL NEGATIVE REPORT     Performed at [facility]   Report Status PENDING   Incomplete     Labs: Basic Metabolic Panel:  Recent Labs Lab 07/24/13 1910 07/24/13 2025 07/25/13 0355 07/26/13 0425  NA 135* 139 139 136*  K 3.3* 3.0* 3.1* 4.6  CL 95* 98 104 101  CO2 27  --  26 27  GLUCOSE 139* 104* 106* 93  BUN 19 17 14 10   CREATININE 0.66 0.70 0.59 0.62  CALCIUM 9.2  --  7.5* 8.2*   Liver Function Tests:  Recent Labs Lab 07/24/13 1910 07/25/13 0355  AST 17 12  ALT 8 7  ALKPHOS 64 46  BILITOT 0.5 0.4  PROT 6.5 5.0*  ALBUMIN 3.1* 2.4*   No results found for this basename: LIPASE, AMYLASE,  in the last 168 hours No results found for this basename: AMMONIA,  in the last 168 hours CBC:  Recent Labs Lab 07/24/13 1910 07/24/13 2025 07/25/13 0355 07/26/13 0425  WBC 22.7*  --  16.6* 9.5  NEUTROABS 20.9*  --  14.5*  --   HGB 12.6 12.6 11.3* 11.1*  HCT 37.4 37.0 34.7* 34.2*  MCV 87.4  --  88.1 88.4  PLT 245  --  242 224   Cardiac Enzymes:  Recent Labs Lab 07/24/13 2153  TROPONINI <0.30   BNP: BNP (last 3 results)  Recent Labs  07/13/13 1454 07/24/13 2153  PROBNP 489.0* 226.8   CBG: No results found for this basename: GLUCAP,  in the last 168 hours  Additional labs: 1. Fasting lipids: Cholesterol 126, triglycerides 87, HDL 25, LDL 84 and VLDL 17 2. Hemoglobin A1c 10/28/11 was 5.8. A1c from this admission is pending. 3. TSH 07/16/13:1.074 4. Influenza panel PCR: Negative 5. HIV antibody 07/13/13: Nonreactive 6. Urine Legionella and streptococcal antigen 07/13/13: Negative 7. 2-D echo 07/14/13: Study Conclusions  - Left ventricle: The cavity size was normal. Wall thickness was increased in a pattern of mild LVH. There was mild focal basal hypertrophy of the septum. Systolic function was normal. The estimated ejection fraction  was in the range of 60% to 65%. Wall motion was normal; there were no regional wall motion abnormalities. Doppler parameters are consistent with abnormal left ventricular relaxation (grade 1 diastolic dysfunction). - Aortic valve: There was no stenosis. - Mitral valve: Mildly calcified annulus. Trivial regurgitation. - Right ventricle: The cavity size was normal. Systolic function was normal. - Pulmonary arteries: No complete TR doppler jet [name] unable to estimate PA systolic pressure. - Inferior vena cava: The vessel was normal in size; the respirophasic diameter changes were in the normal range (= 50%); findings are consistent with normal central venous pressure. Impressions:  - Normal LV size with mild concentric LV hypertrophy as well as focal basal septal hypertrophy. Normal RV size and systolic function. No significant valvular abnormalities.     Signed:  Vernell Leep, MD, FACP, FHM. [facility] Pager [phone]  If 7PM-7AM, please contact night-coverage [email or web address] Password [facility] 07/27/2013,  1:32 PM

## 2013-07-27 NOTE — Progress Notes (Signed)
NEURO HOSPITALIST PROGRESS NOTE   SUBJECTIVE:                                                                                                                        Patient is comfortable with no complaints. No new issues over night.   OBJECTIVE:                                                                                                                           Vital signs in last 24 hours: Temp:  [97.9 F (36.6 C)-98.3 F (36.8 C)] 98.1 F (36.7 C) (03/18 0439) Pulse Rate:  [69-81] 69 (03/18 0439) Resp:  [16-20] 16 (03/18 0439) BP: (121-126)/(41-81) 121/46 mmHg (03/18 0439) SpO2:  [93 %-95 %] 95 % (03/18 0439)  Intake/Output from previous day: 03/17 0701 - 03/18 0700 In: 1040 [P.O.:1040] Out: 1200 [Urine:1200] Intake/Output this shift:   Nutritional status: Cardiac  Past Medical History  Diagnosis Date  . ANXIETY 08/16/2008  . CAD, UNSPECIFIED SITE 10/17/2008  . CHOLELITHIASIS 08/23/2008  . COPD 01/04/2010    FeV1 101%, DLCO64% 2008  . 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 DIVERTICULOSIS-COLON 04/07/2008  . GERD 04/19/2007  . HYPERSOMNIA, ASSOCIATED WITH SLEEP APNEA 02/01/2007  . HYPERTENSION 11/12/2006  . MITRAL VALVE PROLAPSE, HX OF 10/07/2008  . NEOPLASM, MALIGNANT, BREAST, RIGHT 02/15/2008  . OSTEOPOROSIS 04/04/2008  . Raynaud's syndrome 04/25/2009  . Airway obstruction   . History of diverticulitis of colon   . Tortuous colon   . Anal stricture   . Chronic constipation   . Fatty liver   . Hiatal hernia   . Esophageal stricture   . IBS (irritable bowel syndrome)   . Pulmonary fibrosis   . Sleep apnea   . COPD (chronic obstructive pulmonary disease)   . Colitis     sigmoid  . Candida esophagitis   . Diverticulitis   . Hearing aid worn     bilateral  . Atrial fibrillation     she denies known hx of afib 02/24/13  . Complication of anesthesia     difficult intubation      Neurologic Exam:  Mental Status:   Alert, oriented to date and year and hospital, thought content appropriate. Speech  fluent without evidence of aphasia. Able to follow 3 step commands without difficulty.  Cranial Nerves:  II: Discs flat bilaterally; Visual fields grossly normal, pupils equal, round, reactive to light and accommodation  III,IV, VI: ptosis not present, extra-ocular motions intact bilaterally  V,VII: smile symmetric, facial light touch sensation normal bilaterally  VIII: hearing normal bilaterally  IX,X: gag reflex present  XI: bilateral shoulder shrug  XII: midline tongue extension without atrophy or fasciculations  Motor:  5/5 throughout Tone and bulk:normal tone throughout; no atrophy noted  Sensory: Pinprick and light touch intact throughout, with distal bilateral neuropathy in legs.  Deep Tendon Reflexes:  2+ in UE and non in KJ or AJ Plantars:  Right: downgoing  Left: downgoing  Cerebellar:  normal finger-to-nose, normal heel-to-shin test  Gait: not tested  CV: pulses palpable throughout      Lab Results: Basic Metabolic Panel:  Recent Labs Lab 07/24/13 1910 07/24/13 2025 07/25/13 0355 07/26/13 0425  NA 135* 139 139 136*  K 3.3* 3.0* 3.1* 4.6  CL 95* 98 104 101  CO2 27  --  26 27  GLUCOSE 139* 104* 106* 93  BUN 19 17 14 10   CREATININE 0.66 0.70 0.59 0.62  CALCIUM 9.2  --  7.5* 8.2*    Liver Function Tests:  Recent Labs Lab 07/24/13 1910 07/25/13 0355  AST 17 12  ALT 8 7  ALKPHOS 64 46  BILITOT 0.5 0.4  PROT 6.5 5.0*  ALBUMIN 3.1* 2.4*   No results found for this basename: LIPASE, AMYLASE,  in the last 168 hours No results found for this basename: AMMONIA,  in the last 168 hours  CBC:  Recent Labs Lab 07/24/13 1910 07/24/13 2025 07/25/13 0355 07/26/13 0425  WBC 22.7*  --  16.6* 9.5  NEUTROABS 20.9*  --  14.5*  --   HGB 12.6 12.6 11.3* 11.1*  HCT 37.4 37.0 34.7* 34.2*  MCV 87.4  --  88.1 88.4  PLT 245  --  242 224    Cardiac Enzymes:  Recent Labs Lab  07/24/13 2153  TROPONINI <0.30    Lipid Panel:  Recent Labs Lab 07/27/13 0405  CHOL 126  TRIG 87  HDL 25*  CHOLHDL 5.0  VLDL 17  LDLCALC 84    CBG: No results found for this basename: GLUCAP,  in the last 168 hours  Microbiology: Results for orders placed during the hospital encounter of 07/24/13  URINE CULTURE     Status: None   Collection Time    07/24/13  6:56 PM      Result Value Ref Range Status   Specimen Description URINE, CATHETERIZED   Final   Special Requests NONE   Final   Culture  Setup Time     Final   Value: 07/25/2013 03:15     Performed at [facility]     Final   Value: NO GROWTH     Performed at [facility]   Culture     Final   Value: NO GROWTH     Performed at [facility]   Report Status 07/25/2013 FINAL   Final  CULTURE, BLOOD (ROUTINE X 2)     Status: None   Collection Time    07/24/13  7:10 PM      Result Value Ref Range Status   Specimen Description BLOOD LEFT ARM   Final   Special Requests BOTTLES DRAWN AEROBIC AND ANAEROBIC [place]   Final  Culture  Setup Time     Final   Value: 07/25/2013 03:10     Performed at [facility]   Culture     Final   Value:        BLOOD CULTURE RECEIVED NO GROWTH TO DATE CULTURE WILL BE HELD FOR 5 DAYS BEFORE ISSUING A FINAL NEGATIVE REPORT     Performed at [facility]   Report Status PENDING   Incomplete  CULTURE, BLOOD (ROUTINE X 2)     Status: None   Collection Time    07/24/13  8:16 PM      Result Value Ref Range Status   Specimen Description BLOOD LEFT ARM   Final   Special Requests BOTTLES DRAWN AEROBIC AND ANAEROBIC [facility] EACH   Final   Culture  Setup Time     Final   Value: 07/25/2013 03:10     Performed at [facility]   Culture     Final   Value:        BLOOD CULTURE RECEIVED NO GROWTH TO DATE CULTURE WILL BE HELD FOR 5 DAYS BEFORE ISSUING A FINAL NEGATIVE REPORT     Performed at [facility]   Report Status PENDING    Incomplete    Coagulation Studies: No results found for this basename: LABPROT, INR,  in the last 72 hours  Imaging: Mr Virgel Paling Wo Contrast  07/26/2013   CLINICAL DATA:  Stroke  EXAM: MRA HEAD WITHOUT CONTRAST  TECHNIQUE: Angiographic images of the Circle of Willis were obtained using MRA technique without intravenous contrast.  COMPARISON:  MRI head 07/26/2013  FINDINGS: Both vertebral arteries are widely patent. Right PICA is patent. Left PICA not visualized. AICA is patent bilaterally. Superior cerebellar and posterior cerebral arteries are patent bilaterally without significant stenosis.  Internal carotid artery widely patent bilaterally. Anterior and middle cerebral arteries are widely patent bilaterally without significant stenosis.  Negative for cerebral aneurysm.  IMPRESSION: Negative   Electronically Signed   By: Franchot Gallo M.D.   On: 07/26/2013 17:32   Mr Jeri Cos [id number] Contrast  07/26/2013   CLINICAL DATA:  Slurred speech. History of breast cancer and melanoma.  EXAM: MRI HEAD WITHOUT AND WITH CONTRAST  TECHNIQUE: Multiplanar, multiecho pulse sequences of the brain and surrounding structures were obtained without and with intravenous contrast.  CONTRAST:  64mL MULTIHANCE GADOBENATE DIMEGLUMINE 529 MG/ML IV SOLN  COMPARISON:  Head CT 03/30/13  FINDINGS: There is a 5 mm acute infarct in the inferior right parietal lobe (series 4, image 19). There is no evidence of intracranial hemorrhage. Incidental note is made of a cavum septum pellucidum et vergae. Patchy and confluent T2 hyperintensities in the cerebral white matter bilaterally are nonspecific but compatible with moderate chronic small vessel ischemic disease. There is moderate cerebral atrophy. There is no evidence of mass, midline shift, or extra-axial fluid collection. No abnormal enhancement is identified.  There are large bilateral mastoid effusions. Major intracranial vascular flow voids are preserved. Prior bilateral cataract  surgery is noted. Paranasal sinuses are clear.  IMPRESSION: 1. Punctate acute infarct in the right parietal lobe. 2. Moderate chronic small vessel ischemic disease. 3. Bilateral mastoid effusions.   Electronically Signed   By: Logan Bores   On: 07/26/2013 08:12    VASCULAR LAB  PRELIMINARY PRELIMINARY PRELIMINARY PRELIMINARY  Carotid duplex completed.  Preliminary report: Bilateral: 1-39% ICA stenosis. Vertebral artery flow is antegrade.       MEDICATIONS  Scheduled: . amLODipine  5 mg Oral Daily  . antiseptic oral rinse  15 mL Mouth Rinse BID  . aspirin  325 mg Oral Daily  . diazepam  2.5 mg Oral QHS  . doxycycline  100 mg Oral Q12H  . enoxaparin (LOVENOX) injection  40 mg Subcutaneous Q24H  . ipratropium-albuterol  3 mL Nebulization BID  . pantoprazole  40 mg Oral Daily  . sodium chloride  3 mL Intravenous Q12H    ASSESSMENT/PLAN:                                                                                                            80 y.o. female presenting with generalized weakness and some mental status changes. Work up included an MRI of the brain. MRI reviewed and shows a small right parietal lobe infarct. Echocardiogram performed and unremarkable. Carotid dopplers WNL. A1c Pending and LDL 84. Symptoms likely secondary to metabolic abnormalities and MRI finding likely incidental.   Recommend: 1) Continue ASA daily 2) Recommend A1c <7  If elevated would address appropriately.   Neurology will S/O  Assessment and plan discussed with with attending physician and they are in agreement.    Etta Quill PA-C [facility] [phone]  07/27/2013, 9:17 AM

## 2013-07-28 ENCOUNTER — Encounter: Payer: Self-pay | Admitting: *Deleted

## 2013-07-29 ENCOUNTER — Encounter: Payer: Self-pay | Admitting: Internal Medicine

## 2013-07-29 ENCOUNTER — Ambulatory Visit (INDEPENDENT_AMBULATORY_CARE_PROVIDER_SITE_OTHER): Payer: Medicare Other | Admitting: Critical Care Medicine

## 2013-07-29 ENCOUNTER — Non-Acute Institutional Stay (SKILLED_NURSING_FACILITY): Payer: Medicare Other | Admitting: Internal Medicine

## 2013-07-29 ENCOUNTER — Encounter: Payer: Self-pay | Admitting: Critical Care Medicine

## 2013-07-29 VITALS — BP 140/86 | HR 86 | Temp 97.1°F | Ht 62.0 in | Wt 117.0 lb

## 2013-07-29 DIAGNOSIS — J438 Other emphysema: Secondary | ICD-10-CM

## 2013-07-29 DIAGNOSIS — I6350 Cerebral infarction due to unspecified occlusion or stenosis of unspecified cerebral artery: Secondary | ICD-10-CM

## 2013-07-29 DIAGNOSIS — R6510 Systemic inflammatory response syndrome (SIRS) of non-infectious origin without acute organ dysfunction: Secondary | ICD-10-CM

## 2013-07-29 DIAGNOSIS — J439 Emphysema, unspecified: Secondary | ICD-10-CM

## 2013-07-29 DIAGNOSIS — J189 Pneumonia, unspecified organism: Secondary | ICD-10-CM

## 2013-07-29 DIAGNOSIS — Z8673 Personal history of transient ischemic attack (TIA), and cerebral infarction without residual deficits: Secondary | ICD-10-CM | POA: Insufficient documentation

## 2013-07-29 DIAGNOSIS — I10 Essential (primary) hypertension: Secondary | ICD-10-CM | POA: Diagnosis not present

## 2013-07-29 DIAGNOSIS — I2510 Atherosclerotic heart disease of native coronary artery without angina pectoris: Secondary | ICD-10-CM

## 2013-07-29 DIAGNOSIS — R918 Other nonspecific abnormal finding of lung field: Secondary | ICD-10-CM | POA: Insufficient documentation

## 2013-07-29 DIAGNOSIS — I639 Cerebral infarction, unspecified: Secondary | ICD-10-CM

## 2013-07-29 DIAGNOSIS — I499 Cardiac arrhythmia, unspecified: Secondary | ICD-10-CM

## 2013-07-29 DIAGNOSIS — K219 Gastro-esophageal reflux disease without esophagitis: Secondary | ICD-10-CM

## 2013-07-29 MED ORDER — BUDESONIDE-FORMOTEROL FUMARATE 160-4.5 MCG/ACT IN AERO: 2.0000 | INHALATION_SPRAY | Freq: Two times a day (BID) | RESPIRATORY_TRACT | Status: DC

## 2013-07-29 NOTE — Assessment & Plan Note (Signed)
On CT 06/2013; outpt follow-up

## 2013-07-29 NOTE — Assessment & Plan Note (Signed)
Completed stroke workup: carotid Dopplers (bilateral: 1-39% ICA stenosis. Vertebral artery flow is antegrade), MRA brain without contrast: Negative, LDL 84 and hemoglobin A1c is pending. Neurology was consulted and have seen patient today and recommend continued aspirin 325 mg daily which patient had stopped taking for 2 weeks prior to admission thinking that she was allergic to it while she actually was not and recommend hemoglobin A1c <7 if elevated. Recent echo showed normal EF-will not repeat. Patient will be going to skilled nursing facility for rehabilitation.

## 2013-07-29 NOTE — Patient Instructions (Signed)
Start symbicort two puff twice daily Use oxygen at night, and as needed daytime Return 1 month

## 2013-07-29 NOTE — Assessment & Plan Note (Signed)
Controlled on norvasc 

## 2013-07-29 NOTE — Assessment & Plan Note (Signed)
Possible;finish doxycycline

## 2013-07-29 NOTE — Assessment & Plan Note (Signed)
Continue omeprazole 

## 2013-07-29 NOTE — Assessment & Plan Note (Signed)
Chronic respiratory failure; nightly O2 and nebs prn

## 2013-07-29 NOTE — Progress Notes (Signed)
MRN: [id number] Name: [name] [name]  Sex: female Age: 80 y.o. DOB: [birth year]  [place] #: [facility] Facility/Room: 1006-2 Level Of Care: SNF Provider: Inocencio Homes D Emergency Contacts: Extended Emergency Contact Information Primary Emergency Contact: Connor,Catherine Address: [place], [place] Phone: [phone] Work Phone: [phone] Relation: Daughter Secondary Emergency Contact: Alcario Drought [place] Phone: [phone] Mobile Phone: [phone] Relation: Son  Code Status:   Allergies: Azithromycin; Ciprofloxacin; Levaquin; Penicillins; Prednisone; and Sulfamethoxazole  Chief Complaint  Patient presents with  . nursing home admission    HPI: Patient is 80 y.o. female who was admitted to hospital for SIRS and is now being admitted to SNF for OT/PT.  Past Medical History  Diagnosis Date  . ANXIETY 08/16/2008  . CAD, UNSPECIFIED SITE 10/17/2008  . CHOLELITHIASIS 08/23/2008  . COPD 01/04/2010    FeV1 101%, DLCO64% 2008  . 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 DIVERTICULOSIS-COLON 04/07/2008  . GERD 04/19/2007  . HYPERSOMNIA, ASSOCIATED WITH SLEEP APNEA 02/01/2007  . HYPERTENSION 11/12/2006  . MITRAL VALVE PROLAPSE, HX OF 10/07/2008  . NEOPLASM, MALIGNANT, BREAST, RIGHT 02/15/2008  . OSTEOPOROSIS 04/04/2008  . Raynaud's syndrome 04/25/2009  . Airway obstruction   . History of diverticulitis of colon   . Tortuous colon   . Anal stricture   . Chronic constipation   . Fatty liver   . Hiatal hernia   . Esophageal stricture   . IBS (irritable bowel syndrome)   . Pulmonary fibrosis   . Sleep apnea   . COPD (chronic obstructive pulmonary disease)   . Colitis     sigmoid  . Candida esophagitis   . Diverticulitis   . Hearing aid worn     bilateral  . Atrial fibrillation     she denies known hx of afib 02/24/13  . Complication of anesthesia     difficult intubation    Past  Surgical History  Procedure Laterality Date  . Abdominal hysterectomy    . Bilateral salpingoophorectomy      s/p prolapsed bladder  . Bladder surgery      prolapsed  . Thoracotomy      granuloma, pulmonary-thoracotomy  . Breast surgery  07-21-07    mastectomy (right), all margins neg and LNs neg   . Appendectomy    . Tonsillectomy    . Rotator cuff repair    . Mastectomy      right  . Breast cyst excision      left  . Mouth surgery    . Eye surgery      cataract removal bilateral  . Melanoma excision      rt clavicle Dr Rhoderick [facility] office)  . Lung surgery        Medication List       This list is accurate as of: 07/29/13 11:28 PM.  Always use your most recent med list.               acetaminophen 325 MG tablet  Commonly known as:  TYLENOL  Take 650 mg by mouth every 6 (six) hours as needed for mild pain.     albuterol (2.5 MG/3ML) 0.083% nebulizer solution  Commonly known as:  PROVENTIL  Take 3 mLs (2.5 mg total) by nebulization every 6 (six) hours as needed for wheezing or shortness of breath.     AMBULATORY NON FORMULARY MEDICATION  Oxygen at bedtime-2 or 2 1/2 liters     amLODipine 5 MG tablet  Commonly known as:  NORVASC  Take 5 mg by mouth daily. For HTN     aspirin 325 MG tablet  Take 325 mg by mouth daily.     diazepam 5 MG tablet  Commonly known as:  VALIUM  Take 2.5 mg by mouth at bedtime. For anxiety     doxycycline 100 MG tablet  Commonly known as:  VIBRA-TABS  Take 1 tablet (100 mg total) by mouth every 12 (twelve) hours. Discontinue after 07/31/2013 doses.     feeding supplement (ENSURE COMPLETE) Liqd  Take 237 mLs by mouth 3 times/day as needed-between meals & bedtime (Allow per pt or pt's family request). For nutritional supplement     nitroGLYCERIN 0.4 MG SL tablet  Commonly known as:  NITROSTAT  Place 0.4 mg under the tongue every 5 (five) minutes as needed for chest pain.     omeprazole 20 MG capsule  Commonly known as:  PRILOSEC   Take 20 mg by mouth daily. For stomach issues     polyethylene glycol packet  Commonly known as:  MIRALAX / GLYCOLAX  Take 17 g by mouth daily. For constipation     senna 8.6 MG Tabs tablet  Commonly known as:  SENOKOT  Take 1 tablet by mouth daily. For constipation        Meds ordered this encounter  Medications  . aspirin 325 MG tablet    Sig: Take 325 mg by mouth daily.    Immunization History  Administered Date(s) Administered  . H1N1 05/09/2008  . Influenza Split 02/06/2011, 03/02/2013  . Influenza Whole 02/16/2007, 02/15/2008, 02/28/2009, 01/09/2010, 01/11/2012  . Pneumococcal Polysaccharide-23 05/12/2001, 03/09/2009  . Td 05/12/2005  . Tdap 12/25/2010  . Zoster 04/14/2006    History  Substance Use Topics  . Smoking status: Former Smoker -- 2.00 packs/day for 30 years    Types: Cigarettes    Quit date: 05/13/1983  . Smokeless tobacco: Never Used  . Alcohol Use: Yes     Comment: 1-2 glaases wine per day    Family history is noncontributory    Review of Systems  DATA OBTAINED: from patient; no c/o GENERAL: Feels well no fevers, fatigue, appetite changes SKIN: No itching, rash or wounds EYES: No eye pain, redness, discharge EARS: No earache, tinnitus, change in hearing NOSE: No congestion, drainage or bleeding  MOUTH/THROAT: No mouth or tooth pain, No sore throat, No difficulty chewing or swallowing  RESPIRATORY: No cough, wheezing, SOB CARDIAC: No chest pain, palpitations, lower extremity edema  GI: No abdominal pain, No N/V/D or constipation, No heartburn or reflux  GU: No dysuria, frequency or urgency, or incontinence  MUSCULOSKELETAL: No unrelieved bone/joint pain NEUROLOGIC: No headache, dizziness or focal weakness PSYCHIATRIC: No overt anxiety or sadness. Sleeps well. No behavior issue.   Filed Vitals:   07/29/13 2256  BP: 139/62  Pulse: 75  Temp: 97.2 F (36.2 C)  Resp: 18    Physical Exam  GENERAL APPEARANCE: Alert, conversant.  Appropriately groomed. No acute distress.  SKIN: No diaphoresis rash, or wounds HEAD: Normocephalic, atraumatic  EYES: Conjunctiva/lids clear. Pupils round, reactive. EOMs intact.  EARS: External exam WNL, canals clear. Hearing grossly normal.  NOSE: No deformity or discharge.  MOUTH/THROAT: Lips w/o lesions RESPIRATORY: Breathing is even, unlabored. Lung sounds are clear   CARDIOVASCULAR: Heart irreg;no murmurs, rubs or gallops. No peripheral edema.   GASTROINTESTINAL: Abdomen is soft, non-tender, not distended  w/ normal bowel sounds. GENITOURINARY: Bladder non tender, not distended  MUSCULOSKELETAL: No abnormal joints or musculature NEUROLOGIC: Oriented X3. Cranial nerves 2-12 grossly intact. Moves all extremities no tremor. PSYCHIATRIC: Mood and affect appropriate to situation, no behavioral issues  Patient Active Problem List   Diagnosis Date Noted  . Acute CVA (cerebrovascular accident) 07/29/2013  . Pulmonary nodules 07/29/2013  . Pneumonia 07/24/2013  . SIRS (systemic inflammatory response syndrome) 07/24/2013  . Leukocytosis, unspecified 07/16/2013  . UTI (urinary tract infection) 07/13/2013  . History of breast cancer, T1b, N0, Lumpectomy 07/21/2007. 04/21/2011  . COPD with emphysema, gold stage A 01/04/2010  . RAYNAUD'S SYNDROME 04/25/2009  . GERD 04/19/2007  . HYPERTENSION 11/12/2006    CBC    Component Value Date/Time   WBC 9.5 07/26/2013 0425   RBC 3.87 07/26/2013 0425   HGB 11.1* 07/26/2013 0425   HCT 34.2* 07/26/2013 0425   PLT 224 07/26/2013 0425   MCV 88.4 07/26/2013 0425   LYMPHSABS 1.2 07/25/2013 0355   MONOABS 0.7 07/25/2013 0355   EOSABS 0.2 07/25/2013 0355   BASOSABS 0.0 07/25/2013 0355    CMP     Component Value Date/Time   NA 136* 07/26/2013 0425   K 4.6 07/26/2013 0425   CL 101 07/26/2013 0425   CO2 27 07/26/2013 0425   GLUCOSE 93 07/26/2013 0425   BUN 10 07/26/2013 0425   CREATININE 0.62 07/26/2013 0425   CALCIUM 8.2* 07/26/2013 0425   PROT 5.0*  07/25/2013 0355   ALBUMIN 2.4* 07/25/2013 0355   AST 12 07/25/2013 0355   ALT 7 07/25/2013 0355   ALKPHOS 46 07/25/2013 0355   BILITOT 0.4 07/25/2013 0355   GFRNONAA 79* 07/26/2013 0425   GFRAA >90 07/26/2013 0425    Assessment and Plan  SIRS (systemic inflammatory response syndrome) - source not clear? Acute bronchitis  - Urine cultures: Negative. Blood cultures x2: Negative to date  - CTA chest and CT abdomen and pelvis: Without acute findings & no PE.  - No history of diarrhea. Patient actually constipated and has not had a BM in 3 days.  - Influenza panel PCR negative, a recent HIV antibody test: Negative, recent urinary Legionella and streptococcal antigen negative  - Patient was empirically placed on IV vancomycin and aztreonam. However since no clear source was been identified, DC'ed IV antibiotics 3/17 and started on oral doxycycline. Patient has remained afebrile. If patient continues to spike fevers, recommend infectious disease consultation and ? Evaluate for noninfectious causes of fever. We'll complete total 5 days course of oral doxycycline.   Acute CVA (cerebrovascular accident) Completed stroke workup: carotid Dopplers (bilateral: 1-39% ICA stenosis. Vertebral artery flow is antegrade), MRA brain without contrast: Negative, LDL 84 and hemoglobin A1c is pending. Neurology was consulted and have seen patient today and recommend continued aspirin 325 mg daily which patient had stopped taking for 2 weeks prior to admission thinking that she was allergic to it while she actually was not and recommend hemoglobin A1c <7 if elevated. Recent echo showed normal EF-will not repeat. Patient will be going to skilled nursing facility for rehabilitation.   HYPERTENSION Controlled on norvasc  COPD with emphysema, gold stage A Chronic respiratory failure; nightly O2 and nebs prn  GERD Continue omeprazole  Pulmonary nodules On CT 06/2013; outpt follow-up  Pneumonia Possible;finish  doxycycline  IRREGULAR HEART RHYTHM - hx says afib, pt denies a fib; rate is controlled, pt is on ASA 325 mg, I don't think she is good coag candidate;will obtain ECG  Hennie Duos, MD

## 2013-07-29 NOTE — Progress Notes (Signed)
Subjective:    Patient ID: [name] [name], female    DOB: [birth year], 80 y.o.   MRN: [id number]  HPI  This is a 80 y.o. 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 The pt also has hx of upper airway obstruction with difficult intubation with prior operative procedures. Also hx Breast CA s/p resection and XRT. Hx Gallstone disease with conservative Rx due to difficult intubation status.   07/29/2013 Chief Complaint  Patient presents with  . Follow-up    Been in hospital 3x in last week due to urinary tract infrect and mild PNA- in [facility] for rehab  Three times pna in [facility].   UTI issues.    Past Medical History  Diagnosis Date  . ANXIETY 08/16/2008  . CAD, UNSPECIFIED SITE 10/17/2008  . CHOLELITHIASIS 08/23/2008  . COPD 01/04/2010    FeV1 101%, DLCO64% 2008  . 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 DIVERTICULOSIS-COLON 04/07/2008  . GERD 04/19/2007  . HYPERSOMNIA, ASSOCIATED WITH SLEEP APNEA 02/01/2007  . HYPERTENSION 11/12/2006  . MITRAL VALVE PROLAPSE, HX OF 10/07/2008  . NEOPLASM, MALIGNANT, BREAST, RIGHT 02/15/2008  . OSTEOPOROSIS 04/04/2008  . Raynaud's syndrome 04/25/2009  . Airway obstruction   . History of diverticulitis of colon   . Tortuous colon   . Anal stricture   . Chronic constipation   . Fatty liver   . Hiatal hernia   . Esophageal stricture   . IBS (irritable bowel syndrome)   . Pulmonary fibrosis   . Sleep apnea   . COPD (chronic obstructive pulmonary disease)   . Colitis     sigmoid  . Candida esophagitis   . Diverticulitis   . Hearing aid worn     bilateral  . Atrial fibrillation     she denies known hx of afib 02/24/13  . Complication of anesthesia     difficult intubation     Family History  Problem Relation Age of Onset  . Parkinsonism Father   . Coronary artery disease Father   . Breast cancer      aunt  . Colon cancer      aunt, age 91  . Uterine cancer      aunt   . Stroke Mother   . Heart failure Mother   . Cancer Maternal Aunt     breast     History   Social History  . Marital Status: Widowed    Spouse Name: N/A    Number of Children: 3  . Years of Education: N/A   Occupational History  . retireed    Social History Main Topics  . Smoking status: Former Smoker -- 2.00 packs/day for 30 years    Types: Cigarettes    Quit date: 05/13/1983  . Smokeless tobacco: Never Used  . Alcohol Use: Yes     Comment: 1-2 glaases wine per day  . Drug Use: No  . Sexual Activity: No   Other Topics Concern  . Not on file   Social History Narrative   Living in assisted living     Allergies  Allergen Reactions  . Azithromycin Hives, Diarrhea, Dermatitis and Rash  . Ciprofloxacin Swelling  . Levaquin [Levofloxacin] Hives  . Penicillins Hives    has had mild doses that she did not have reactions to  . Prednisone     Unknown   . Sulfamethoxazole Nausea And Vomiting     Outpatient Prescriptions Prior to Visit  Medication Sig Dispense Refill  . acetaminophen (TYLENOL) 325 MG tablet Take 650 mg by mouth every 6 (six) hours as needed for mild pain.      Marland Kitchen albuterol (PROVENTIL) (2.5 MG/3ML) 0.083% nebulizer solution Take 3 mLs (2.5 mg total) by nebulization every 6 (six) hours as needed for wheezing or shortness of breath.      . AMBULATORY NON FORMULARY MEDICATION Oxygen at bedtime-2 or 2 1/2 liters      . amLODipine (NORVASC) 5 MG tablet Take 5 mg by mouth daily. For HTN      . diazepam (VALIUM) 5 MG tablet Take 2.5 mg by mouth at bedtime. For anxiety      . doxycycline (VIBRA-TABS) 100 MG tablet Take 1 tablet (100 mg total) by mouth every 12 (twelve) hours. Discontinue after 07/31/2013 doses.      . nitroGLYCERIN (NITROSTAT) 0.4 MG SL tablet Place 0.4 mg under the tongue every 5 (five) minutes as needed for chest pain.      Marland Kitchen omeprazole (PRILOSEC) 20 MG capsule Take 20 mg by mouth daily. For stomach issues      . polyethylene glycol (MIRALAX /  GLYCOLAX) packet Take 17 g by mouth daily. For constipation      . senna (SENOKOT) 8.6 MG TABS tablet Take 1 tablet by mouth daily. For constipation      . feeding supplement, ENSURE COMPLETE, (ENSURE COMPLETE) LIQD Take 237 mLs by mouth 3 times/day as needed-between meals & bedtime (Allow per pt or pt's family request). For nutritional supplement       No facility-administered medications prior to visit.     Review of Systems  As above     Objective:   Physical Exam BP 140/86  Pulse 86  Temp(Src) 97.1 F (36.2 C) (Oral)  Ht 5\' 2"  (1.575 m)  Wt 53.071 kg (117 lb)  BMI 21.39 kg/m2  SpO2 94%  GEN: A/Ox3;  , NAD, elderly  HEENT:  [place]/AT,  EACs-clear, TMs-wnl, NOSE-clear, THROAT-clear, no lesions, no postnasal drip or exudate noted.   NECK:  Supple w/ fair ROM; no JVD; normal carotid impulses w/o bruits; no thyromegaly or nodules palpated; no lymphadenopathy.  RESP  Coarse BS w/ no wheezingno accessory muscle use,   CARD:  RRR, no m/r/g  , no peripheral edema, pulses intact, no cyanosis or clubbing.  Musco: Warm bil, no deformities or joint swelling noted.   Neuro: alert, no focal deficits noted.    Skin: Warm, no lesions rash noted over anterior trunk and thorax          Assessment & Plan:   COPD with emphysema, gold stage A Centrilobular emphysema seen on recent CT scan of chest Recent pneumonia now resolved Plan Start symbicort two puff twice daily Use oxygen at night, and as needed daytime Return 1 month    Updated Medication List Outpatient Encounter Prescriptions as of 07/29/2013  Medication Sig  . acetaminophen (TYLENOL) 325 MG tablet Take 650 mg by mouth every 6 (six) hours as needed for mild pain.  Marland Kitchen albuterol (PROVENTIL) (2.5 MG/3ML) 0.083% nebulizer solution Take 3 mLs (2.5 mg total) by nebulization every 6 (six) hours as needed for wheezing or shortness of breath.  . AMBULATORY NON FORMULARY MEDICATION Oxygen at bedtime-2 or 2 1/2 liters  .  amLODipine (NORVASC) 5 MG tablet Take 5 mg by mouth daily. For HTN  . diazepam (VALIUM) 5 MG tablet Take 2.5 mg by mouth at bedtime. For anxiety  . doxycycline (VIBRA-TABS) 100 MG  tablet Take 1 tablet (100 mg total) by mouth every 12 (twelve) hours. Discontinue after 07/31/2013 doses.  . nitroGLYCERIN (NITROSTAT) 0.4 MG SL tablet Place 0.4 mg under the tongue every 5 (five) minutes as needed for chest pain.  Marland Kitchen omeprazole (PRILOSEC) 20 MG capsule Take 20 mg by mouth daily. For stomach issues  . polyethylene glycol (MIRALAX / GLYCOLAX) packet Take 17 g by mouth daily. For constipation  . senna (SENOKOT) 8.6 MG TABS tablet Take 1 tablet by mouth daily. For constipation  . feeding supplement, ENSURE COMPLETE, (ENSURE COMPLETE) LIQD Take 237 mLs by mouth 3 times/day as needed-between meals & bedtime (Allow per pt or pt's family request). For nutritional supplement  . [DISCONTINUED] budesonide-formoterol (SYMBICORT) 160-4.5 MCG/ACT inhaler Inhale 2 puffs into the lungs 2 (two) times daily.  . [DISCONTINUED] budesonide-formoterol (SYMBICORT) 160-4.5 MCG/ACT inhaler Inhale 2 puffs into the lungs 2 (two) times daily.  . [DISCONTINUED] budesonide-formoterol (SYMBICORT) 160-4.5 MCG/ACT inhaler Inhale 2 puffs into the lungs 2 (two) times daily.  . [DISCONTINUED] budesonide-formoterol (SYMBICORT) 160-4.5 MCG/ACT inhaler Inhale 2 puffs into the lungs 2 (two) times daily.

## 2013-07-29 NOTE — Assessment & Plan Note (Signed)
-   source not clear? Acute bronchitis  - Urine cultures: Negative. Blood cultures x2: Negative to date  - CTA chest and CT abdomen and pelvis: Without acute findings & no PE.  - No history of diarrhea. Patient actually constipated and has not had a BM in 3 days.  - Influenza panel PCR negative, a recent HIV antibody test: Negative, recent urinary Legionella and streptococcal antigen negative  - Patient was empirically placed on IV vancomycin and aztreonam. However since no clear source was been identified, DC'ed IV antibiotics 3/17 and started on oral doxycycline. Patient has remained afebrile. If patient continues to spike fevers, recommend infectious disease consultation and ? Evaluate for noninfectious causes of fever. We'll complete total 5 days course of oral doxycycline.

## 2013-07-30 NOTE — Assessment & Plan Note (Signed)
Centrilobular emphysema seen on recent CT scan of chest Recent pneumonia now resolved Plan Start symbicort two puff twice daily Use oxygen at night, and as needed daytime Return 1 month

## 2013-07-31 LAB — CULTURE, BLOOD (ROUTINE X 2)
CULTURE: NO GROWTH
Culture: NO GROWTH

## 2013-08-09 ENCOUNTER — Encounter: Payer: Self-pay | Admitting: Internal Medicine

## 2013-08-09 ENCOUNTER — Non-Acute Institutional Stay (SKILLED_NURSING_FACILITY): Payer: Medicare Other | Admitting: Internal Medicine

## 2013-08-09 DIAGNOSIS — I10 Essential (primary) hypertension: Secondary | ICD-10-CM

## 2013-08-09 DIAGNOSIS — I639 Cerebral infarction, unspecified: Secondary | ICD-10-CM

## 2013-08-09 DIAGNOSIS — R6510 Systemic inflammatory response syndrome (SIRS) of non-infectious origin without acute organ dysfunction: Secondary | ICD-10-CM | POA: Diagnosis not present

## 2013-08-09 DIAGNOSIS — R918 Other nonspecific abnormal finding of lung field: Secondary | ICD-10-CM | POA: Diagnosis not present

## 2013-08-09 DIAGNOSIS — K219 Gastro-esophageal reflux disease without esophagitis: Secondary | ICD-10-CM | POA: Diagnosis not present

## 2013-08-09 DIAGNOSIS — J438 Other emphysema: Secondary | ICD-10-CM | POA: Diagnosis not present

## 2013-08-09 DIAGNOSIS — J439 Emphysema, unspecified: Secondary | ICD-10-CM

## 2013-08-09 DIAGNOSIS — I6350 Cerebral infarction due to unspecified occlusion or stenosis of unspecified cerebral artery: Secondary | ICD-10-CM

## 2013-08-09 NOTE — Progress Notes (Signed)
MRN: [id number] Name: [name] [name]  Sex: female Age: 80 y.o. DOB: 1923/06/30  [facility]ath[facility] #: [facility] Facility/Room: 1006-2 Level Of Care: SNF Provider: Inocencio Homes D Emergency Contacts: Extended Emergency Contact Information Primary Emergency Contact: Connor,Catherine Address: [place], [place] Phone: [phone] Work Phone: [phone] Relation: Daughter Secondary Emergency Contact: [place] Phone: [phone] Mobile Phone: [phone] Relation: Son  Code Status: FULL  Allergies: Azithromycin; Ciprofloxacin; Levaquin; Penicillins; Prednisone; and Sulfamethoxazole  Chief Complaint  Patient presents with  . Discharge Note    HPI: Patient is 80 y.o. female who is ready to be discharged to home.  Past Medical History  Diagnosis Date  . ANXIETY 08/16/2008  . CAD, UNSPECIFIED SITE 10/17/2008  . CHOLELITHIASIS 08/23/2008  . COPD 01/04/2010    FeV1 101%, DLCO64% 2008  . 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 DIVERTICULOSIS-COLON 04/07/2008  . GERD 04/19/2007  . HYPERSOMNIA, ASSOCIATED WITH SLEEP APNEA 02/01/2007  . HYPERTENSION 11/12/2006  . MITRAL VALVE PROLAPSE, HX OF 10/07/2008  . NEOPLASM, MALIGNANT, BREAST, RIGHT 02/15/2008  . OSTEOPOROSIS 04/04/2008  . Raynaud's syndrome 04/25/2009  . Airway obstruction   . History of diverticulitis of colon   . Tortuous colon   . Anal stricture   . Chronic constipation   . Fatty liver   . Hiatal hernia   . Esophageal stricture   . IBS (irritable bowel syndrome)   . Pulmonary fibrosis   . Sleep apnea   . COPD (chronic obstructive pulmonary disease)   . Colitis     sigmoid  . Candida esophagitis   . Diverticulitis   . Hearing aid worn     bilateral  . Atrial fibrillation     she denies known hx of afib 02/24/13  . Complication of anesthesia     difficult intubation    Past Surgical History  Procedure Laterality  Date  . Abdominal hysterectomy    . Bilateral salpingoophorectomy      s/p prolapsed bladder  . Bladder surgery      prolapsed  . Thoracotomy      granuloma, pulmonary-thoracotomy  . Breast surgery  07-21-07    mastectomy (right), all margins neg and LNs neg   . Appendectomy    . Tonsillectomy    . Rotator cuff repair    . Mastectomy      right  . Breast cyst excision      left  . Mouth surgery    . Eye surgery      cataract removal bilateral  . Melanoma excision      rt clavicle Dr Rhoderick [facility] office)  . Lung surgery        Medication List       This list is accurate as of: 08/09/13 10:35 AM.  Always use your most recent med list.               acetaminophen 325 MG tablet  Commonly known as:  TYLENOL  Take 650 mg by mouth every 6 (six) hours as needed for mild pain.     albuterol (2.5 MG/3ML) 0.083% nebulizer solution  Commonly known as:  PROVENTIL  Take 3 mLs (2.5 mg total) by nebulization every 6 (six) hours as needed for wheezing or shortness of breath.     AMBULATORY NON FORMULARY MEDICATION  Oxygen at bedtime-2 or 2 1/2 liters  amLODipine 5 MG tablet  Commonly known as:  NORVASC  Take 5 mg by mouth daily. For HTN     aspirin 325 MG tablet  Take 325 mg by mouth daily.     budesonide-formoterol 160-4.5 MCG/ACT inhaler  Commonly known as:  SYMBICORT  Inhale 2 puffs into the lungs 2 (two) times daily.     diazepam 5 MG tablet  Commonly known as:  VALIUM  Take 2.5 mg by mouth at bedtime. For anxiety     nitroGLYCERIN 0.4 MG SL tablet  Commonly known as:  NITROSTAT  Place 0.4 mg under the tongue every 5 (five) minutes as needed for chest pain.     omeprazole 20 MG capsule  Commonly known as:  PRILOSEC  Take 20 mg by mouth daily. For stomach issues     polyethylene glycol packet  Commonly known as:  MIRALAX / GLYCOLAX  Take 17 g by mouth daily. For constipation     senna 8.6 MG Tabs tablet  Commonly known as:  SENOKOT  Take 1 tablet by mouth  daily. For constipation        Meds ordered this encounter  Medications  . budesonide-formoterol (SYMBICORT) 160-4.5 MCG/ACT inhaler    Sig: Inhale 2 puffs into the lungs 2 (two) times daily.    Immunization History  Administered Date(s) Administered  . H1N1 05/09/2008  . Influenza Split 02/06/2011, 03/02/2013  . Influenza Whole 02/16/2007, 02/15/2008, 02/28/2009, 01/09/2010, 01/11/2012  . Pneumococcal Polysaccharide-23 05/12/2001, 03/09/2009  . Td 05/12/2005  . Tdap 12/25/2010  . Zoster 04/14/2006    History  Substance Use Topics  . Smoking status: Former Smoker -- 2.00 packs/day for 30 years    Types: Cigarettes    Quit date: 05/13/1983  . Smokeless tobacco: Never Used  . Alcohol Use: Yes     Comment: 1-2 glaases wine per day    Filed Vitals:   08/09/13 0948  BP: 123/72  Pulse: 91  Temp: 98.6 F (37 C)  Resp: 18    Physical Exam  GENERAL APPEARANCE: Alert, conversant. Appropriately groomed. No acute distress.  HEENT: Unremarkable. RESPIRATORY: Breathing is even, unlabored. Lung sounds are clear   CARDIOVASCULAR: Heart RRR no murmurs, rubs or gallops. No peripheral edema.  GASTROINTESTINAL: Abdomen is soft, non-tender, not distended w/ normal bowel sounds.  NEUROLOGIC: Cranial nerves 2-12 grossly intact. Moves all extremities no tremor.  Patient Active Problem List   Diagnosis Date Noted  . Acute CVA (cerebrovascular accident) 07/29/2013  . Pulmonary nodules 07/29/2013  . SIRS (systemic inflammatory response syndrome) 07/24/2013  . Leukocytosis, unspecified 07/16/2013  . UTI (urinary tract infection) 07/13/2013  . History of breast cancer, T1b, N0, Lumpectomy 07/21/2007. 04/21/2011  . COPD with emphysema, gold stage A 01/04/2010  . RAYNAUD'S SYNDROME 04/25/2009  . GERD 04/19/2007  . HYPERTENSION 11/12/2006    CBC    Component Value Date/Time   WBC 9.5 07/26/2013 0425   RBC 3.87 07/26/2013 0425   HGB 11.1* 07/26/2013 0425   HCT 34.2* 07/26/2013 0425    PLT 224 07/26/2013 0425   MCV 88.4 07/26/2013 0425   LYMPHSABS 1.2 07/25/2013 0355   MONOABS 0.7 07/25/2013 0355   EOSABS 0.2 07/25/2013 0355   BASOSABS 0.0 07/25/2013 0355    CMP     Component Value Date/Time   NA 136* 07/26/2013 0425   K 4.6 07/26/2013 0425   CL 101 07/26/2013 0425   CO2 27 07/26/2013 0425   GLUCOSE 93 07/26/2013 0425   BUN 10 07/26/2013 0425  CREATININE 0.62 07/26/2013 0425   CALCIUM 8.2* 07/26/2013 0425   PROT 5.0* 07/25/2013 0355   ALBUMIN 2.4* 07/25/2013 0355   AST 12 07/25/2013 0355   ALT 7 07/25/2013 0355   ALKPHOS 46 07/25/2013 0355   BILITOT 0.4 07/25/2013 0355   GFRNONAA 79* 07/26/2013 0425   GFRAA >90 07/26/2013 0425    Assessment and Plan  Patient is improved and stable for discharge to home with [facility], PT, and nursing.  Hennie Duos, MD

## 2013-08-16 ENCOUNTER — Telehealth: Payer: Self-pay | Admitting: Internal Medicine

## 2013-08-16 NOTE — Telephone Encounter (Signed)
[name] [name] from [place] would like verbal order to see patient twice a wk for 4 wks . For physical therapy for gait training,transfer training and therapeutic exercise.

## 2013-08-17 NOTE — Telephone Encounter (Signed)
Ok per Dr Swords, verbal order given 

## 2013-08-31 ENCOUNTER — Ambulatory Visit (INDEPENDENT_AMBULATORY_CARE_PROVIDER_SITE_OTHER): Payer: Medicare Other | Admitting: Critical Care Medicine

## 2013-08-31 ENCOUNTER — Ambulatory Visit: Payer: Medicare Other | Admitting: Critical Care Medicine

## 2013-08-31 ENCOUNTER — Encounter: Payer: Self-pay | Admitting: Critical Care Medicine

## 2013-08-31 VITALS — BP 114/72 | HR 72 | Ht 61.0 in | Wt 115.6 lb

## 2013-08-31 DIAGNOSIS — I2510 Atherosclerotic heart disease of native coronary artery without angina pectoris: Secondary | ICD-10-CM | POA: Diagnosis not present

## 2013-08-31 DIAGNOSIS — J438 Other emphysema: Secondary | ICD-10-CM

## 2013-08-31 DIAGNOSIS — J439 Emphysema, unspecified: Secondary | ICD-10-CM

## 2013-08-31 NOTE — Progress Notes (Signed)
Subjective:    Patient ID: [name] [name], female    DOB: 1924/02/01, 80 y.o.   MRN: [id number]  HPI  This is a 80 y.o. 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 The pt also has hx of upper airway obstruction with difficult intubation with prior operative procedures. Also hx Breast CA s/p resection and XRT. Hx Gallstone disease with conservative Rx due to difficult intubation status.    08/31/2013 Chief Complaint  Patient presents with  . 1 month follow up    Pt states breathing is unchanged. Pt c/o SOB with mod activity.  C/o mild intermittent productive cough with white and yellow mucous and PND. Denies CP.   Started symbicort last OV.  Also oxygen QHS and prn. Not back to hosp recently.  No bladder times.  PNA x 3 this winter.  On the symbicort.  No real mucus.  Some dyspnea with exertion.  Is walking down the hall.   No falls. Pt has visiting RN and PT , back at retirement home    Past Medical History  Diagnosis Date  . ANXIETY 08/16/2008  . CAD, UNSPECIFIED SITE 10/17/2008  . CHOLELITHIASIS 08/23/2008  . COPD 01/04/2010    FeV1 101%, DLCO64% 2008  . 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 DIVERTICULOSIS-COLON 04/07/2008  . GERD 04/19/2007  . HYPERSOMNIA, ASSOCIATED WITH SLEEP APNEA 02/01/2007  . HYPERTENSION 11/12/2006  . MITRAL VALVE PROLAPSE, HX OF 10/07/2008  . NEOPLASM, MALIGNANT, BREAST, RIGHT 02/15/2008  . OSTEOPOROSIS 04/04/2008  . Raynaud's syndrome 04/25/2009  . Airway obstruction   . History of diverticulitis of colon   . Tortuous colon   . Anal stricture   . Chronic constipation   . Fatty liver   . Hiatal hernia   . Esophageal stricture   . IBS (irritable bowel syndrome)   . Pulmonary fibrosis   . Sleep apnea   . COPD (chronic obstructive pulmonary disease)   . Colitis     sigmoid  . Candida esophagitis   . Diverticulitis   . Hearing aid worn     bilateral  . Atrial fibrillation    she denies known hx of afib 02/24/13  . Complication of anesthesia     difficult intubation     Family History  Problem Relation Age of Onset  . Parkinsonism Father   . Coronary artery disease Father   . Breast cancer      aunt  . Colon cancer      aunt, age 92  . Uterine cancer      aunt  . Stroke Mother   . Heart failure Mother   . Cancer Maternal Aunt     breast     History   Social History  . Marital Status: Widowed    Spouse Name: N/A    Number of Children: 3  . Years of Education: N/A   Occupational History  . retireed    Social History Main Topics  . Smoking status: Former Smoker -- 2.00 packs/day for 30 years    Types: Cigarettes    Quit date: 05/13/1983  . Smokeless tobacco: Never Used  . Alcohol Use: Yes     Comment: 1-2 glaases wine per day  . Drug Use: No  . Sexual Activity: No   Other Topics Concern  . Not on file   Social History Narrative   Living in assisted living     Allergies  Allergen Reactions  .  Azithromycin Hives, Diarrhea, Dermatitis and Rash  . Ciprofloxacin Swelling  . Levaquin [Levofloxacin] Hives  . Penicillins Hives    has had mild doses that she did not have reactions to  . Prednisone     Unknown   . Sulfamethoxazole Nausea And Vomiting     Outpatient Prescriptions Prior to Visit  Medication Sig Dispense Refill  . acetaminophen (TYLENOL) 325 MG tablet Take 650 mg by mouth every 6 (six) hours as needed for mild pain.      Marland Kitchen albuterol (PROVENTIL) (2.5 MG/3ML) 0.083% nebulizer solution Take 3 mLs (2.5 mg total) by nebulization every 6 (six) hours as needed for wheezing or shortness of breath.      . AMBULATORY NON FORMULARY MEDICATION Oxygen at bedtime-2 or 2 1/2 liters      . amLODipine (NORVASC) 5 MG tablet Take 5 mg by mouth daily. For HTN      . aspirin 325 MG tablet Take 325 mg by mouth daily.      . budesonide-formoterol (SYMBICORT) 160-4.5 MCG/ACT inhaler Inhale 2 puffs into the lungs 2 (two) times daily.      .  diazepam (VALIUM) 5 MG tablet Take 2.5 mg by mouth at bedtime. For anxiety      . nitroGLYCERIN (NITROSTAT) 0.4 MG SL tablet Place 0.4 mg under the tongue every 5 (five) minutes as needed for chest pain.      Marland Kitchen omeprazole (PRILOSEC) 20 MG capsule Take 20 mg by mouth daily. For stomach issues      . polyethylene glycol (MIRALAX / GLYCOLAX) packet Take 17 g by mouth daily. For constipation      . senna (SENOKOT) 8.6 MG TABS tablet Take 1 tablet by mouth daily. For constipation       No facility-administered medications prior to visit.     Review of Systems  As above     Objective:   Physical Exam BP 114/72  Pulse 72  Ht 5\' 1"  (1.549 m)  Wt 115 lb 9.6 oz (52.436 kg)  BMI 21.85 kg/m2  SpO2 96%  GEN: A/Ox3;  , NAD, elderly  HEENT:  [place]/AT,  EACs-clear, TMs-wnl, NOSE-clear, THROAT-clear, no lesions, no postnasal drip or exudate noted.   NECK:  Supple w/ fair ROM; no JVD; normal carotid impulses w/o bruits; no thyromegaly or nodules palpated; no lymphadenopathy.  RESP  Coarse BS w/ no wheezingno accessory muscle use,   CARD:  RRR, no m/r/g  , no peripheral edema, pulses intact, no cyanosis or clubbing.  Musco: Warm bil, no deformities or joint swelling noted.   Neuro: alert, no focal deficits noted.    Skin: Warm, no lesions rash noted over anterior trunk and thorax          Assessment & Plan:   COPD with emphysema Gold B Gold B Copd with chronic airflow obstruction Plan No change in inhaled or maintenance medications. Return in  3 mon   Updated Medication List Outpatient Encounter Prescriptions as of 08/31/2013  Medication Sig  . acetaminophen (TYLENOL) 325 MG tablet Take 650 mg by mouth every 6 (six) hours as needed for mild pain.  Marland Kitchen albuterol (PROVENTIL) (2.5 MG/3ML) 0.083% nebulizer solution Take 3 mLs (2.5 mg total) by nebulization every 6 (six) hours as needed for wheezing or shortness of breath.  . AMBULATORY NON FORMULARY MEDICATION Oxygen at bedtime-2 or 2  1/2 liters  . amLODipine (NORVASC) 5 MG tablet Take 5 mg by mouth daily. For HTN  . aspirin 325 MG tablet Take  325 mg by mouth daily.  . budesonide-formoterol (SYMBICORT) 160-4.5 MCG/ACT inhaler Inhale 2 puffs into the lungs 2 (two) times daily.  . diazepam (VALIUM) 5 MG tablet Take 2.5 mg by mouth at bedtime. For anxiety  . fluticasone (FLONASE) 50 MCG/ACT nasal spray Place 1 spray into both nostrils daily.  . nitroGLYCERIN (NITROSTAT) 0.4 MG SL tablet Place 0.4 mg under the tongue every 5 (five) minutes as needed for chest pain.  Marland Kitchen omeprazole (PRILOSEC) 20 MG capsule Take 20 mg by mouth daily. For stomach issues  . polyethylene glycol (MIRALAX / GLYCOLAX) packet Take 17 g by mouth daily. For constipation  . senna (SENOKOT) 8.6 MG TABS tablet Take 1 tablet by mouth daily. For constipation

## 2013-08-31 NOTE — Patient Instructions (Signed)
No change in medications. Return in   3 months 

## 2013-09-01 NOTE — Assessment & Plan Note (Signed)
Gold B Copd with chronic airflow obstruction Plan No change in inhaled or maintenance medications. Return in  3 mon

## 2013-09-12 ENCOUNTER — Telehealth: Payer: Self-pay | Admitting: Internal Medicine

## 2013-09-12 NOTE — Telephone Encounter (Signed)
Michelle physical therapist  from [facility] is calling  And they would like to continue physical therapy with pt for  another month to work on her balance. Please call with verbal order

## 2013-09-12 NOTE — Telephone Encounter (Signed)
Ok per Dr Swords, verbal order given 

## 2013-09-18 DIAGNOSIS — I10 Essential (primary) hypertension: Secondary | ICD-10-CM | POA: Diagnosis not present

## 2013-09-18 DIAGNOSIS — J449 Chronic obstructive pulmonary disease, unspecified: Secondary | ICD-10-CM | POA: Diagnosis not present

## 2013-09-18 DIAGNOSIS — IMO0001 Reserved for inherently not codable concepts without codable children: Secondary | ICD-10-CM | POA: Diagnosis not present

## 2013-09-18 DIAGNOSIS — Z9981 Dependence on supplemental oxygen: Secondary | ICD-10-CM | POA: Diagnosis not present

## 2013-09-18 DIAGNOSIS — R269 Unspecified abnormalities of gait and mobility: Secondary | ICD-10-CM | POA: Diagnosis not present

## 2013-09-21 ENCOUNTER — Encounter: Payer: Self-pay | Admitting: Family Medicine

## 2013-09-21 ENCOUNTER — Ambulatory Visit (INDEPENDENT_AMBULATORY_CARE_PROVIDER_SITE_OTHER): Payer: Medicare Other | Admitting: Family Medicine

## 2013-09-21 VITALS — BP 120/82 | HR 94 | Temp 97.6°F | Ht 61.0 in | Wt 113.0 lb

## 2013-09-21 DIAGNOSIS — S0990XA Unspecified injury of head, initial encounter: Secondary | ICD-10-CM | POA: Diagnosis not present

## 2013-09-21 DIAGNOSIS — I2510 Atherosclerotic heart disease of native coronary artery without angina pectoris: Secondary | ICD-10-CM | POA: Diagnosis not present

## 2013-09-21 DIAGNOSIS — K5289 Other specified noninfective gastroenteritis and colitis: Secondary | ICD-10-CM

## 2013-09-21 DIAGNOSIS — K529 Noninfective gastroenteritis and colitis, unspecified: Secondary | ICD-10-CM

## 2013-09-21 NOTE — Progress Notes (Signed)
No chief complaint on file.   HPI:  N/V/D/fever: -started 3 days ago -multiple episodes of diarrhea, vomiting - 1 episode, fever for 1 -2 days - highest fever 100, no blood in stools -all of this is resolving -had minor head bump the same day on edge of door seal when getting out of car - no pain -no headache, vision changes, weakness, numbness, speech issues, LOC, dizziness, urinary symptoms, abd pain, neck pain -all symptoms resolved today -family figured gi bug as this is going around  ROS: See pertinent positives and negatives per HPI.  Past Medical History  Diagnosis Date  . ANXIETY 08/16/2008  . CAD, UNSPECIFIED SITE 10/17/2008  . CHOLELITHIASIS 08/23/2008  . COPD 01/04/2010    FeV1 101%, DLCO64% 2008  . 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 DIVERTICULOSIS-COLON 04/07/2008  . GERD 04/19/2007  . HYPERSOMNIA, ASSOCIATED WITH SLEEP APNEA 02/01/2007  . HYPERTENSION 11/12/2006  . MITRAL VALVE PROLAPSE, HX OF 10/07/2008  . NEOPLASM, MALIGNANT, BREAST, RIGHT 02/15/2008  . OSTEOPOROSIS 04/04/2008  . Raynaud's syndrome 04/25/2009  . Airway obstruction   . History of diverticulitis of colon   . Tortuous colon   . Anal stricture   . Chronic constipation   . Fatty liver   . Hiatal hernia   . Esophageal stricture   . IBS (irritable bowel syndrome)   . Pulmonary fibrosis   . Sleep apnea   . COPD (chronic obstructive pulmonary disease)   . Colitis     sigmoid  . Candida esophagitis   . Diverticulitis   . Hearing aid worn     bilateral  . Atrial fibrillation     she denies known hx of afib 02/24/13  . Complication of anesthesia     difficult intubation    Past Surgical History  Procedure Laterality Date  . Abdominal hysterectomy    . Bilateral salpingoophorectomy      s/p prolapsed bladder  . Bladder surgery      prolapsed  . Thoracotomy      granuloma, pulmonary-thoracotomy  . Breast surgery  07-21-07    mastectomy (right), all margins neg and LNs neg   .  Appendectomy    . Tonsillectomy    . Rotator cuff repair    . Mastectomy      right  . Breast cyst excision      left  . Mouth surgery    . Eye surgery      cataract removal bilateral  . Melanoma excision      rt clavicle Dr [facility] office)  . Lung surgery      Family History  Problem Relation Age of Onset  . Parkinsonism Father   . Coronary artery disease Father   . Breast cancer      aunt  . Colon cancer      aunt, age 32  . Uterine cancer      aunt  . Stroke Mother   . Heart failure Mother   . Cancer Maternal Aunt     breast    History   Social History  . Marital Status: Widowed    Spouse Name: N/A    Number of Children: 3  . Years of Education: N/A   Occupational History  . retireed    Social History Main Topics  . Smoking status: Former Smoker -- 2.00 packs/day for 30 years    Types: Cigarettes    Quit date: 05/13/1983  . Smokeless tobacco: Never Used  . Alcohol Use: Yes  Comment: 1-2 glaases wine per day  . Drug Use: No  . Sexual Activity: No   Other Topics Concern  . None   Social History Narrative   Living in assisted living    Current outpatient prescriptions:acetaminophen (TYLENOL) 325 MG tablet, Take 650 mg by mouth every 6 (six) hours as needed for mild pain., Disp: , Rfl: ;  albuterol (PROVENTIL) (2.5 MG/3ML) 0.083% nebulizer solution, Take 3 mLs (2.5 mg total) by nebulization every 6 (six) hours as needed for wheezing or shortness of breath., Disp: , Rfl: ;  AMBULATORY NON FORMULARY MEDICATION, Oxygen at bedtime-2 or 2 1/2 liters, Disp: , Rfl:  amLODipine (NORVASC) 5 MG tablet, Take 5 mg by mouth daily. For HTN, Disp: , Rfl: ;  aspirin 325 MG tablet, Take 325 mg by mouth daily., Disp: , Rfl: ;  budesonide-formoterol (SYMBICORT) 160-4.5 MCG/ACT inhaler, Inhale 2 puffs into the lungs 2 (two) times daily., Disp: , Rfl: ;  diazepam (VALIUM) 5 MG tablet, Take 2.5 mg by mouth at bedtime. For anxiety, Disp: , Rfl:  fluticasone (FLONASE) 50  MCG/ACT nasal spray, Place 1 spray into both nostrils daily., Disp: , Rfl: ;  nitroGLYCERIN (NITROSTAT) 0.4 MG SL tablet, Place 0.4 mg under the tongue every 5 (five) minutes as needed for chest pain., Disp: , Rfl: ;  omeprazole (PRILOSEC) 20 MG capsule, Take 20 mg by mouth daily. For stomach issues, Disp: , Rfl: ;  polyethylene glycol (MIRALAX / GLYCOLAX) packet, Take 17 g by mouth daily. For constipation, Disp: , Rfl:  senna (SENOKOT) 8.6 MG TABS tablet, Take 1 tablet by mouth daily. For constipation, Disp: , Rfl:   EXAM:  Filed Vitals:   09/21/13 1008  BP: 120/82  Pulse: 94  Temp: 97.6 F (36.4 C)    Body mass index is 21.36 kg/(m^2).  GENERAL: vitals reviewed and listed above, alert, oriented, appears well hydrated and in no acute distress  HEENT: atraumatic - no signs of head injury, no bruising or swelling or tenderness to touch, conjunttiva clear, no obvious abnormalities on inspection of external nose and ears, PERRLA, vision grossly intact, no nystagmus  NECK: no obvious masses on inspection, supple, no TTP  LUNGS: clear to auscultation bilaterally, no wheezes, rales or rhonchi, good air movement  CV: HRRR, no peripheral edema  MS: moves all extremities without noticeable abnormality  PSYCH: pleasant and cooperative, no obvious depression or anxiety  NEURO: CN II-XII grossly intact, EOMI, finger to nose normal, no pronator drift, normal muscle strength and sensation throughout, gait normal  ASSESSMENT AND PLAN:  Discussed the following assessment and plan:  Gastroenteritis  Minor head injury  -symptoms all resolved, normal exam -do not think minor head bump related to GI symptoms -follow up as needed -Patient advised to return or notify a doctor immediately if symptoms worsen or persist or new concerns arise.  Patient Instructions  Viral Gastroenteritis Viral gastroenteritis is also known as stomach flu. This condition affects the stomach and intestinal tract.  It can cause sudden diarrhea and vomiting. The illness typically lasts 3 to 8 days. Most people develop an immune response that eventually gets rid of the virus. While this natural response develops, the virus can make you quite ill. CAUSES  Many different viruses can cause gastroenteritis, such as rotavirus or noroviruses. You can catch one of these viruses by consuming contaminated food or water. You may also catch a virus by sharing utensils or other personal items with an infected person or by touching a contaminated  surface. SYMPTOMS  The most common symptoms are diarrhea and vomiting. These problems can cause a severe loss of body fluids (dehydration) and a body salt (electrolyte) imbalance. Other symptoms may include:  Fever.  Headache.  Fatigue.  Abdominal pain. DIAGNOSIS  Your caregiver can usually diagnose viral gastroenteritis based on your symptoms and a physical exam. A stool sample may also be taken to test for the presence of viruses or other infections. TREATMENT  This illness typically goes away on its own. Treatments are aimed at rehydration. The most serious cases of viral gastroenteritis involve vomiting so severely that you are not able to keep fluids down. In these cases, fluids must be given through an intravenous line (IV). HOME CARE INSTRUCTIONS   Drink enough fluids to keep your urine clear or pale yellow. Drink small amounts of fluids frequently and increase the amounts as tolerated.  Ask your caregiver for specific rehydration instructions.  Avoid:  Foods high in sugar.  Alcohol.  Carbonated drinks.  Tobacco.  Juice.  Caffeine drinks.  Extremely hot or cold fluids.  Fatty, greasy foods.  Too much intake of anything at one time.  Dairy products until 24 to 48 hours after diarrhea stops.  You may consume probiotics. Probiotics are active cultures of beneficial bacteria. They may lessen the amount and number of diarrheal stools in adults.  Probiotics can be found in yogurt with active cultures and in supplements.  Wash your hands well to avoid spreading the virus.  Only take over-the-counter or prescription medicines for pain, discomfort, or fever as directed by your caregiver. Do not give aspirin to children. Antidiarrheal medicines are not recommended.  Ask your caregiver if you should continue to take your regular prescribed and over-the-counter medicines.  Keep all follow-up appointments as directed by your caregiver. SEEK IMMEDIATE MEDICAL CARE IF:   You are unable to keep fluids down.  You do not urinate at least once every 6 to 8 hours.  You develop shortness of breath.  You notice blood in your stool or vomit. This may look like coffee grounds.  You have abdominal pain that increases or is concentrated in one small area (localized).  You have persistent vomiting or diarrhea.  You have a fever.  The patient is a child younger than 3 months, and he or she has a fever.  The patient is a child older than 3 months, and he or she has a fever and persistent symptoms.  The patient is a child older than 3 months, and he or she has a fever and symptoms suddenly get worse.  The patient is a baby, and he or she has no tears when crying. MAKE SURE YOU:   Understand these instructions.  Will watch your condition.  Will get help right away if you are not doing well or get worse. Document Released: 04/28/2005 Document Revised: 07/21/2011 Document Reviewed: 02/12/2011 [facility] Patient Information 2014 [place].      Lucretia Kern

## 2013-09-21 NOTE — Progress Notes (Signed)
Pre visit review using our clinic review tool, if applicable. No additional management support is needed unless otherwise documented below in the visit note. 

## 2013-09-21 NOTE — Patient Instructions (Signed)
Viral Gastroenteritis Viral gastroenteritis is also known as stomach flu. This condition affects the stomach and intestinal tract. It can cause sudden diarrhea and vomiting. The illness typically lasts 3 to 8 days. Most people develop an immune response that eventually gets rid of the virus. While this natural response develops, the virus can make you quite ill. CAUSES  Many different viruses can cause gastroenteritis, such as rotavirus or noroviruses. You can catch one of these viruses by consuming contaminated food or water. You may also catch a virus by sharing utensils or other personal items with an infected person or by touching a contaminated surface. SYMPTOMS  The most common symptoms are diarrhea and vomiting. These problems can cause a severe loss of body fluids (dehydration) and a body salt (electrolyte) imbalance. Other symptoms may include:  Fever.  Headache.  Fatigue.  Abdominal pain. DIAGNOSIS  Your caregiver can usually diagnose viral gastroenteritis based on your symptoms and a physical exam. A stool sample may also be taken to test for the presence of viruses or other infections. TREATMENT  This illness typically goes away on its own. Treatments are aimed at rehydration. The most serious cases of viral gastroenteritis involve vomiting so severely that you are not able to keep fluids down. In these cases, fluids must be given through an intravenous line (IV). HOME CARE INSTRUCTIONS   Drink enough fluids to keep your urine clear or pale yellow. Drink small amounts of fluids frequently and increase the amounts as tolerated.  Ask your caregiver for specific rehydration instructions.  Avoid:  Foods high in sugar.  Alcohol.  Carbonated drinks.  Tobacco.  Juice.  Caffeine drinks.  Extremely hot or cold fluids.  Fatty, greasy foods.  Too much intake of anything at one time.  Dairy products until 24 to 48 hours after diarrhea stops.  You may consume probiotics.  Probiotics are active cultures of beneficial bacteria. They may lessen the amount and number of diarrheal stools in adults. Probiotics can be found in yogurt with active cultures and in supplements.  Wash your hands well to avoid spreading the virus.  Only take over-the-counter or prescription medicines for pain, discomfort, or fever as directed by your caregiver. Do not give aspirin to children. Antidiarrheal medicines are not recommended.  Ask your caregiver if you should continue to take your regular prescribed and over-the-counter medicines.  Keep all follow-up appointments as directed by your caregiver. SEEK IMMEDIATE MEDICAL CARE IF:   You are unable to keep fluids down.  You do not urinate at least once every 6 to 8 hours.  You develop shortness of breath.  You notice blood in your stool or vomit. This may look like coffee grounds.  You have abdominal pain that increases or is concentrated in one small area (localized).  You have persistent vomiting or diarrhea.  You have a fever.  The patient is a child younger than 3 months, and he or she has a fever.  The patient is a child older than 3 months, and he or she has a fever and persistent symptoms.  The patient is a child older than 3 months, and he or she has a fever and symptoms suddenly get worse.  The patient is a baby, and he or she has no tears when crying. MAKE SURE YOU:   Understand these instructions.  Will watch your condition.  Will get help right away if you are not doing well or get worse. Document Released: 04/28/2005 Document Revised: 07/21/2011 Document Reviewed: 02/12/2011   [employer] Patient Information 2014 [employer].  

## 2013-09-22 DIAGNOSIS — I10 Essential (primary) hypertension: Secondary | ICD-10-CM | POA: Diagnosis not present

## 2013-09-22 DIAGNOSIS — R269 Unspecified abnormalities of gait and mobility: Secondary | ICD-10-CM | POA: Diagnosis not present

## 2013-09-22 DIAGNOSIS — J449 Chronic obstructive pulmonary disease, unspecified: Secondary | ICD-10-CM | POA: Diagnosis not present

## 2013-09-22 DIAGNOSIS — Z9981 Dependence on supplemental oxygen: Secondary | ICD-10-CM | POA: Diagnosis not present

## 2013-09-22 DIAGNOSIS — IMO0001 Reserved for inherently not codable concepts without codable children: Secondary | ICD-10-CM | POA: Diagnosis not present

## 2013-09-26 DIAGNOSIS — J449 Chronic obstructive pulmonary disease, unspecified: Secondary | ICD-10-CM | POA: Diagnosis not present

## 2013-09-26 DIAGNOSIS — I10 Essential (primary) hypertension: Secondary | ICD-10-CM | POA: Diagnosis not present

## 2013-09-26 DIAGNOSIS — Z9981 Dependence on supplemental oxygen: Secondary | ICD-10-CM | POA: Diagnosis not present

## 2013-09-26 DIAGNOSIS — IMO0001 Reserved for inherently not codable concepts without codable children: Secondary | ICD-10-CM | POA: Diagnosis not present

## 2013-09-26 DIAGNOSIS — R269 Unspecified abnormalities of gait and mobility: Secondary | ICD-10-CM | POA: Diagnosis not present

## 2013-09-28 ENCOUNTER — Ambulatory Visit (INDEPENDENT_AMBULATORY_CARE_PROVIDER_SITE_OTHER): Payer: Medicare Other | Admitting: Physician Assistant

## 2013-09-28 ENCOUNTER — Encounter: Payer: Self-pay | Admitting: Physician Assistant

## 2013-09-28 ENCOUNTER — Ambulatory Visit (INDEPENDENT_AMBULATORY_CARE_PROVIDER_SITE_OTHER)
Admission: RE | Admit: 2013-09-28 | Discharge: 2013-09-28 | Disposition: A | Discharge status: Home / Self Care | Payer: Medicare Other | Source: Ambulatory Visit | Attending: Physician Assistant | Admitting: Physician Assistant

## 2013-09-28 VITALS — BP 128/70 | HR 73 | Temp 97.4°F | Resp 20 | Wt 117.0 lb

## 2013-09-28 DIAGNOSIS — IMO0001 Reserved for inherently not codable concepts without codable children: Secondary | ICD-10-CM | POA: Diagnosis not present

## 2013-09-28 DIAGNOSIS — I2510 Atherosclerotic heart disease of native coronary artery without angina pectoris: Secondary | ICD-10-CM

## 2013-09-28 DIAGNOSIS — R609 Edema, unspecified: Secondary | ICD-10-CM

## 2013-09-28 DIAGNOSIS — R5381 Other malaise: Secondary | ICD-10-CM

## 2013-09-28 DIAGNOSIS — R079 Chest pain, unspecified: Secondary | ICD-10-CM

## 2013-09-28 DIAGNOSIS — R269 Unspecified abnormalities of gait and mobility: Secondary | ICD-10-CM | POA: Diagnosis not present

## 2013-09-28 DIAGNOSIS — J4489 Other specified chronic obstructive pulmonary disease: Secondary | ICD-10-CM | POA: Diagnosis not present

## 2013-09-28 DIAGNOSIS — Z9981 Dependence on supplemental oxygen: Secondary | ICD-10-CM | POA: Diagnosis not present

## 2013-09-28 DIAGNOSIS — J449 Chronic obstructive pulmonary disease, unspecified: Secondary | ICD-10-CM | POA: Diagnosis not present

## 2013-09-28 DIAGNOSIS — I10 Essential (primary) hypertension: Secondary | ICD-10-CM | POA: Diagnosis not present

## 2013-09-28 LAB — BASIC METABOLIC PANEL
BUN: 8 mg/dL (ref 6–23)
CHLORIDE: 104 meq/L (ref 96–112)
CO2: 30 mEq/L (ref 19–32)
Calcium: 9.4 mg/dL (ref 8.4–10.5)
Creatinine, Ser: 0.8 mg/dL (ref 0.4–1.2)
GFR: 71.97 mL/min (ref >60.00)
Glucose, Bld: 102 mg/dL — ABNORMAL HIGH (ref 70–99)
POTASSIUM: 5 meq/L (ref 3.5–5.1)
Sodium: 140 mEq/L (ref 135–145)

## 2013-09-28 LAB — CBC WITH DIFFERENTIAL/PLATELET
BASOS PCT: 0.3 % (ref 0.0–3.0)
Basophils Absolute: 0 10*3/uL (ref 0.0–0.1)
EOS ABS: 0.1 10*3/uL (ref 0.0–0.7)
Eosinophils Relative: 1.5 % (ref 0.0–5.0)
HCT: 37.3 % (ref 36.0–46.0)
Hemoglobin: 12.4 g/dL (ref 12.0–15.0)
LYMPHS PCT: 29.7 % (ref 12.0–46.0)
Lymphs Abs: 2.3 10*3/uL (ref 0.7–4.0)
MCHC: 33.4 g/dL (ref 30.0–36.0)
MCV: 88.1 fl (ref 78.0–100.0)
Monocytes Absolute: 0.7 10*3/uL (ref 0.1–1.0)
Monocytes Relative: 8.6 % (ref 3.0–12.0)
NEUTROS PCT: 59.9 % (ref 43.0–77.0)
Neutro Abs: 4.7 10*3/uL (ref 1.4–7.7)
Platelets: 242 10*3/uL (ref 150.0–400.0)
RBC: 4.23 Mil/uL (ref 3.87–5.11)
RDW: 14.8 % (ref 11.5–15.5)
WBC: 7.8 10*3/uL (ref 4.0–10.5)

## 2013-09-28 LAB — T4, FREE: FREE T4: 0.86 ng/dL (ref 0.60–1.60)

## 2013-09-28 LAB — TSH: TSH: 0.56 u[IU]/mL (ref 0.35–4.50)

## 2013-09-28 MED ORDER — AMLODIPINE BESYLATE 2.5 MG PO TABS: 2.5000 mg | ORAL_TABLET | Freq: Every day | ORAL | Status: DC

## 2013-09-28 MED ORDER — LOSARTAN POTASSIUM 50 MG PO TABS: ORAL_TABLET | ORAL | Status: DC

## 2013-09-28 NOTE — Progress Notes (Signed)
Subjective:    Patient ID: [name] [name], female    DOB: 30-Jun-1923, 80 y.o.   MRN: [id number]  HPI Patient is an 80 year old Caucasian female presenting to the clinic for bilateral foot swelling, bilateral leg weakness, and chest pain.  The first complaint is leg weakness. The patient was recently in the hospital from March 15 of March 18 for pneumonia. The patient and her daughter daughter state that ever since she was in the hospital the patient has been complaining about some leg weakness. The patient is currently receiving physical therapy twice a week to try to regain her mobility. The patient states that she feels as though she can't get around as easy as she could. She states that the physical therapist works her legs so hard, that sometimes she feels as though she can't walk at all. Her daughter believes that her leg weakness is most likely due to deconditioning from her hospital stay and being ill. She denies claudication.  The second complaint is lower extremity edema. The patient states that at the end of every day for the past 6 weeks, her feet and legs have been swollen. This disappears by the time she wakes up in the morning. She states that she is normally very active, and currently receiving physical therapy for leg deconditioning twice per week as mentioned above. The patient also explains that about 2 months ago she was experiencing a rash that was thought to be caused by her losartan blood pressure pill. This was then discontinued and her amlodipine dose was doubled to replace it. When the rashes did not disappear, it was discovered that the patient was suffering from bedbugs, and is likely not allergic to losartan. The patient is currently only taking amlodipine 5 mg per day for her hypertension. She has not tried leg raises or modified diet with decreased salt intake. As mentioned, laying down at night in bed seems to be enough to reduce the swelling.  The third complaint is chest  pain. The patient states that for the past month or so she has been experiencing chest pain more often at night and most commonly at 4 to 5 AM in the morning which wakes her up. She states that the pain occurs in the middle to left side of her chest and has radiated into the shoulder, the left arm, the left side of her neck, sometimes to her back, and sometimes to her right side of her chest. The longest it has ever lasted was about an hour, however this involved a waxing and waning of symptoms over the course of an hour. She sees cardiology annually with Dr. Verl Blalock. She has been prescribed nitroglycerin in the past to take for chest pain, however she states that she has never been told that she has coronary artery disease or angina. She has not tried to take nitroglycerin for these recent episodes of chest pain. She denies dizziness, lightheadedness, tearing or ripping pain through her chest, and syncope. She denies fevers, chills, nausea, vomiting, diarrhea, and shortness of breath.  Review of Systems As per the history of present illness and are otherwise negative.   Past Medical History  Diagnosis Date  . ANXIETY 08/16/2008  . CAD, UNSPECIFIED SITE 10/17/2008  . CHOLELITHIASIS 08/23/2008  . COPD 01/04/2010    FeV1 101%, DLCO64% 2008  . 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 DIVERTICULOSIS-COLON 04/07/2008  . GERD 04/19/2007  . HYPERSOMNIA, ASSOCIATED WITH SLEEP APNEA 02/01/2007  . HYPERTENSION 11/12/2006  . MITRAL VALVE  PROLAPSE, HX OF 10/07/2008  . NEOPLASM, MALIGNANT, BREAST, RIGHT 02/15/2008  . OSTEOPOROSIS 04/04/2008  . Raynaud's syndrome 04/25/2009  . Airway obstruction   . History of diverticulitis of colon   . Tortuous colon   . Anal stricture   . Chronic constipation   . Fatty liver   . Hiatal hernia   . Esophageal stricture   . IBS (irritable bowel syndrome)   . Pulmonary fibrosis   . Sleep apnea   . COPD (chronic obstructive pulmonary disease)   . Colitis     sigmoid  .  Candida esophagitis   . Diverticulitis   . Hearing aid worn     bilateral  . Atrial fibrillation     she denies known hx of afib 02/24/13  . Complication of anesthesia     difficult intubation   Past Surgical History  Procedure Laterality Date  . Abdominal hysterectomy    . Bilateral salpingoophorectomy      s/p prolapsed bladder  . Bladder surgery      prolapsed  . Thoracotomy      granuloma, pulmonary-thoracotomy  . Breast surgery  07-21-07    mastectomy (right), all margins neg and LNs neg   . Appendectomy    . Tonsillectomy    . Rotator cuff repair    . Mastectomy      right  . Breast cyst excision      left  . Mouth surgery    . Eye surgery      cataract removal bilateral  . Melanoma excision      rt clavicle Dr [facility] office)  . Lung surgery      reports that she quit smoking about 30 years ago. Her smoking use included Cigarettes. She has a 60 pack-year smoking history. She has never used smokeless tobacco. She reports that she drinks alcohol. She reports that she does not use illicit drugs. family history includes Breast cancer in an other family member; Cancer in her maternal aunt; Colon cancer in an other family member; Coronary artery disease in her father; Heart failure in her mother; Parkinsonism in her father; Stroke in her mother; Uterine cancer in an other family member. Allergies  Allergen Reactions  . Azithromycin Hives, Diarrhea, Dermatitis and Rash  . Ciprofloxacin Swelling  . Levaquin [Levofloxacin] Hives  . Penicillins Hives    has had mild doses that she did not have reactions to  . Prednisone     Unknown   . Sulfamethoxazole Nausea And Vomiting    The PFS history was discussed with the PT at the time of visit.    Objective:   Physical Exam  Nursing note and vitals reviewed. Constitutional: She is oriented to person, place, and time. She appears well-developed and well-nourished. No distress.  HENT:  Head: Normocephalic and atraumatic.    Eyes: Conjunctivae and EOM are normal. Pupils are equal, round, and reactive to light.  Neck: Normal range of motion. Neck supple.  Cardiovascular: Normal rate, regular rhythm, normal heart sounds and intact distal pulses.  Exam reveals no gallop and no friction rub.   No murmur heard. Pulmonary/Chest: Effort normal and breath sounds normal. No respiratory distress. She has no wheezes. She has no rales. She exhibits no tenderness.  Musculoskeletal: Normal range of motion. She exhibits no edema (currently no edema in the bilateral LEs) and no tenderness.  Neurological: She is alert and oriented to person, place, and time. She has normal reflexes. She displays normal reflexes. No cranial nerve deficit.  She exhibits normal muscle tone. Coordination normal.  GAIT normal.  Muscle Strength equal bilaterally.  Normal reflexes.  Skin: Skin is warm and dry. No rash noted. She is not diaphoretic. No erythema. No pallor.  Psychiatric: She has a normal mood and affect. Her behavior is normal. Judgment and thought content normal.   Filed Vitals:   09/28/13 1425  BP: 128/70  Pulse: 73  Temp: 97.4 F (36.3 C)  Resp: 20   Lab Results  Component Value Date   WBC 7.8 09/28/2013   HGB 12.4 09/28/2013   HCT 37.3 09/28/2013   PLT 242.0 09/28/2013   GLUCOSE 102* 09/28/2013   CHOL 126 07/27/2013   TRIG 87 07/27/2013   HDL 25* 07/27/2013   LDLDIRECT 138.7 12/29/2007   LDLCALC 84 07/27/2013   ALT 7 07/25/2013   AST 12 07/25/2013   NA 140 09/28/2013   K 5.0 09/28/2013   CL 104 09/28/2013   CREATININE 0.8 09/28/2013   BUN 8 09/28/2013   CO2 30 09/28/2013   TSH 0.56 09/28/2013   INR 0.9 07/19/2007   HGBA1C 5.9* 07/27/2013    EKG: normal sinus rhythm, 1st degree AV block, Low Voltage in limb leads once again.     Assessment & Plan:  [name] [name] was seen today for bilateral foot swelling.  Diagnoses and associated orders for this visit:  Chest pain - Quite possibly the result of worsening underlying angina. Urged  patient and daughter to make appointment with cardiology for the chest pain to be reevaluated. We'll followup in one month to make sure that the cardiology appointment is made and kept. - EKG 12-Lead - CBC with Differential - Basic Metabolic Panel - TSH - T4, Free - DG Chest 2 View; Future  Edema - Very likely the result of the increased amlodipine. Now that losartan is not considered an allergy, we will restart losartan at the previous dose prior to being discontinued, and decreased amlodipine to the dose that was previously tolerated without side effects. We will followup in one month to make sure these medications are being tolerated and that the edema is improving. Also encouraged patient to do leg raises and modify diet to restrict salt. - losartan (COZAAR) 50 MG tablet; TAKE 1 TABLET BY MOUTH ONCE DAILY - amLODipine (NORVASC) 2.5 MG tablet; Take 1 tablet (2.5 mg total) by mouth daily. For HTN - CBC with Differential - Basic Metabolic Panel - TSH - T4, Free  Physical deconditioning - The leg weakness is likely due to a mix of deconditioning and weakness due to rebuilding leg muscles and muscle soreness form PT. - Reassured patient that this is likely going to improve with continued physical therapy. - CBC with Differential - Basic Metabolic Panel - TSH - T4, Free  Plan to follow up in one month to reassess.  Patient Instructions  You will have lab work drawn today. We will call you with the results of this when it is available.  You have a chest x-ray done today at the [place] office. We will call you with the results of this when it is available.  We have restarted the losartan daily and continued her amlodipine at half the dose you're taking currently in order to help the leg swelling.  Continue physical therapy, this will likely improve your leg weakness given enough time.  Schedule an appointment as soon as possible with cardiology for them to evaluate your chest pain  further.  Followup in about one month to make sure that  your symptoms are improving, that you're tolerating the medication well, and that you were able to be seen by cardiology and are being worked up for chest pain by them.

## 2013-09-28 NOTE — Patient Instructions (Signed)
You will have lab work drawn today. We will call you with the results of this when it is available.  You have a chest x-ray done today at the [place] office. We will call you with the results of this when it is available.  We have restarted the losartan daily and continued her amlodipine at half the dose you're taking currently in order to help the leg swelling.  Continue physical therapy, this will likely improve your leg weakness given enough time.  Schedule an appointment as soon as possible with cardiology for them to evaluate your chest pain further.  Followup in about one month to make sure that your symptoms are improving, that you're tolerating the medication well, and that you were able to be seen by cardiology and are being worked up for chest pain by them.    Edema Edema is a buildup of fluids. It is most common in the feet, ankles, and legs. This happens more as a person ages. It may affect one or both legs. HOME CARE   Raise (elevate) the legs or ankles above the level of the heart while lying down.  Avoid sitting or standing still for a long time.  Exercise the legs to help the puffiness (swelling) go down.  A low-salt diet may help lessen the puffiness.  Only take medicine as told by your doctor. GET HELP RIGHT AWAY IF:   You develop shortness of breath or chest pain.  You cannot breathe when you lie down.  You have more puffiness that does not go away with treatment.  You develop pain or redness in the areas that are puffy.  You have a temperature by mouth above 102 F (38.9 C), not controlled by medicine.  You gain 03 lb/1.4 kg or more in 1 day or 05 lb/2.3 kg in a week. MAKE SURE YOU:   Understand these instructions.  Will watch your condition.  Will get help right away if you are not doing well or get worse. Document Released: 10/15/2007 Document Revised: 07/21/2011 Document Reviewed: 10/15/2007 [facility] Patient Information 2014 [place], [place].

## 2013-09-28 NOTE — Progress Notes (Signed)
Pre visit review using our clinic review tool, if applicable. No additional management support is needed unless otherwise documented below in the visit note. 

## 2013-09-29 ENCOUNTER — Telehealth: Payer: Self-pay | Admitting: Physician Assistant

## 2013-09-29 NOTE — Telephone Encounter (Signed)
Called pts daughter and spoke with her regarding her mothers labwork and chest xray. All results were normal and/or stable from previous studies. They will continue the plan we developed yesterday, which included following up with cardiology this month, and following up with this clinic in about a month to reassess symptoms and make sure appointment was kept with cardiology.

## 2013-10-05 DIAGNOSIS — J449 Chronic obstructive pulmonary disease, unspecified: Secondary | ICD-10-CM | POA: Diagnosis not present

## 2013-10-05 DIAGNOSIS — I10 Essential (primary) hypertension: Secondary | ICD-10-CM | POA: Diagnosis not present

## 2013-10-05 DIAGNOSIS — Z9981 Dependence on supplemental oxygen: Secondary | ICD-10-CM | POA: Diagnosis not present

## 2013-10-05 DIAGNOSIS — R269 Unspecified abnormalities of gait and mobility: Secondary | ICD-10-CM | POA: Diagnosis not present

## 2013-10-05 DIAGNOSIS — IMO0001 Reserved for inherently not codable concepts without codable children: Secondary | ICD-10-CM | POA: Diagnosis not present

## 2013-10-06 DIAGNOSIS — IMO0001 Reserved for inherently not codable concepts without codable children: Secondary | ICD-10-CM | POA: Diagnosis not present

## 2013-10-06 DIAGNOSIS — R269 Unspecified abnormalities of gait and mobility: Secondary | ICD-10-CM | POA: Diagnosis not present

## 2013-10-06 DIAGNOSIS — I10 Essential (primary) hypertension: Secondary | ICD-10-CM | POA: Diagnosis not present

## 2013-10-06 DIAGNOSIS — Z9981 Dependence on supplemental oxygen: Secondary | ICD-10-CM | POA: Diagnosis not present

## 2013-10-06 DIAGNOSIS — J449 Chronic obstructive pulmonary disease, unspecified: Secondary | ICD-10-CM | POA: Diagnosis not present

## 2013-10-07 DIAGNOSIS — Z9981 Dependence on supplemental oxygen: Secondary | ICD-10-CM

## 2013-10-07 DIAGNOSIS — IMO0001 Reserved for inherently not codable concepts without codable children: Secondary | ICD-10-CM | POA: Diagnosis not present

## 2013-10-07 DIAGNOSIS — I10 Essential (primary) hypertension: Secondary | ICD-10-CM

## 2013-10-07 DIAGNOSIS — R269 Unspecified abnormalities of gait and mobility: Secondary | ICD-10-CM | POA: Diagnosis not present

## 2013-10-07 DIAGNOSIS — J449 Chronic obstructive pulmonary disease, unspecified: Secondary | ICD-10-CM | POA: Diagnosis not present

## 2013-10-11 DIAGNOSIS — IMO0001 Reserved for inherently not codable concepts without codable children: Secondary | ICD-10-CM | POA: Diagnosis not present

## 2013-10-11 DIAGNOSIS — J449 Chronic obstructive pulmonary disease, unspecified: Secondary | ICD-10-CM | POA: Diagnosis not present

## 2013-10-11 DIAGNOSIS — R269 Unspecified abnormalities of gait and mobility: Secondary | ICD-10-CM | POA: Diagnosis not present

## 2013-10-11 DIAGNOSIS — Z9981 Dependence on supplemental oxygen: Secondary | ICD-10-CM | POA: Diagnosis not present

## 2013-10-11 DIAGNOSIS — I10 Essential (primary) hypertension: Secondary | ICD-10-CM | POA: Diagnosis not present

## 2013-10-13 DIAGNOSIS — I10 Essential (primary) hypertension: Secondary | ICD-10-CM | POA: Diagnosis not present

## 2013-10-13 DIAGNOSIS — R269 Unspecified abnormalities of gait and mobility: Secondary | ICD-10-CM | POA: Diagnosis not present

## 2013-10-13 DIAGNOSIS — Z9981 Dependence on supplemental oxygen: Secondary | ICD-10-CM | POA: Diagnosis not present

## 2013-10-13 DIAGNOSIS — J449 Chronic obstructive pulmonary disease, unspecified: Secondary | ICD-10-CM | POA: Diagnosis not present

## 2013-10-13 DIAGNOSIS — IMO0001 Reserved for inherently not codable concepts without codable children: Secondary | ICD-10-CM | POA: Diagnosis not present

## 2013-11-02 ENCOUNTER — Other Ambulatory Visit (INDEPENDENT_AMBULATORY_CARE_PROVIDER_SITE_OTHER): Payer: Self-pay | Admitting: Surgery

## 2013-11-02 DIAGNOSIS — Z853 Personal history of malignant neoplasm of breast: Secondary | ICD-10-CM

## 2013-11-07 ENCOUNTER — Other Ambulatory Visit: Payer: Self-pay | Admitting: Physician Assistant

## 2013-11-07 ENCOUNTER — Other Ambulatory Visit: Payer: Self-pay | Admitting: Internal Medicine

## 2013-11-15 ENCOUNTER — Ambulatory Visit (INDEPENDENT_AMBULATORY_CARE_PROVIDER_SITE_OTHER): Payer: Medicare Other | Admitting: Physician Assistant

## 2013-11-15 ENCOUNTER — Encounter: Payer: Self-pay | Admitting: Physician Assistant

## 2013-11-15 VITALS — BP 110/72 | HR 66 | Temp 97.7°F | Resp 18 | Wt 116.0 lb

## 2013-11-15 DIAGNOSIS — I2510 Atherosclerotic heart disease of native coronary artery without angina pectoris: Secondary | ICD-10-CM | POA: Diagnosis not present

## 2013-11-15 DIAGNOSIS — N390 Urinary tract infection, site not specified: Secondary | ICD-10-CM

## 2013-11-15 DIAGNOSIS — R300 Dysuria: Secondary | ICD-10-CM

## 2013-11-15 LAB — POCT URINALYSIS DIPSTICK
Bilirubin, UA: NEGATIVE
Glucose, UA: NEGATIVE
KETONES UA: NEGATIVE
Nitrite, UA: NEGATIVE
PROTEIN UA: NEGATIVE
RBC UA: NEGATIVE
SPEC GRAV UA: 1.02
Urobilinogen, UA: 0.2
pH, UA: 5

## 2013-11-15 MED ORDER — NITROFURANTOIN MACROCRYSTAL 100 MG PO CAPS: 100.0000 mg | ORAL_CAPSULE | Freq: Two times a day (BID) | ORAL | Status: DC

## 2013-11-15 NOTE — Progress Notes (Signed)
Subjective:    Patient ID: [name] [name], female    DOB: 1923/05/24, 80 y.o.   MRN: [id number]  Urinary Tract Infection  This is a new problem. The current episode started in the past 7 days (about 4 days). The problem occurs every urination. The problem has been gradually improving. The quality of the pain is described as burning. The pain is mild. There has been no fever. There is no history of pyelonephritis. Associated symptoms include frequency and urgency. Pertinent negatives include no chills, discharge, flank pain, hematuria, hesitancy, nausea, possible pregnancy, sweats or vomiting. She has tried increased fluids (cranberry juice) for the symptoms. The treatment provided mild relief. There is no history of catheterization, kidney stones, a single kidney, urinary stasis or a urological procedure.     Review of Systems  Constitutional: Negative for fever and chills.  Respiratory: Negative for shortness of breath.   Cardiovascular: Negative for chest pain.  Gastrointestinal: Negative for nausea, vomiting and diarrhea.  Genitourinary: Positive for dysuria, urgency and frequency. Negative for hesitancy, hematuria and flank pain.  Skin: Negative for color change, pallor, rash and wound.  Psychiatric/Behavioral: Negative for suicidal ideas, hallucinations, behavioral problems, confusion, sleep disturbance, self-injury and agitation. The patient is not nervous/anxious.   All other systems reviewed and are negative.  Past Medical History  Diagnosis Date  . ANXIETY 08/16/2008  . CAD, UNSPECIFIED SITE 10/17/2008  . CHOLELITHIASIS 08/23/2008  . COPD 01/04/2010    FeV1 101%, DLCO64% 2008  . 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 DIVERTICULOSIS-COLON 04/07/2008  . GERD 04/19/2007  . HYPERSOMNIA, ASSOCIATED WITH SLEEP APNEA 02/01/2007  . HYPERTENSION 11/12/2006  . MITRAL VALVE PROLAPSE, HX OF 10/07/2008  . NEOPLASM, MALIGNANT, BREAST, RIGHT 02/15/2008  . OSTEOPOROSIS 04/04/2008  . Raynaud's  syndrome 04/25/2009  . Airway obstruction   . History of diverticulitis of colon   . Tortuous colon   . Anal stricture   . Chronic constipation   . Fatty liver   . Hiatal hernia   . Esophageal stricture   . IBS (irritable bowel syndrome)   . Pulmonary fibrosis   . Sleep apnea   . COPD (chronic obstructive pulmonary disease)   . Colitis     sigmoid  . Candida esophagitis   . Diverticulitis   . Hearing aid worn     bilateral  . Atrial fibrillation     she denies known hx of afib 02/24/13  . Complication of anesthesia     difficult intubation    History   Social History  . Marital Status: Widowed    Spouse Name: N/A    Number of Children: 3  . Years of Education: N/A   Occupational History  . retireed    Social History Main Topics  . Smoking status: Former Smoker -- 2.00 packs/day for 30 years    Types: Cigarettes    Quit date: 05/13/1983  . Smokeless tobacco: Never Used  . Alcohol Use: Yes     Comment: 1-2 glaases wine per day  . Drug Use: No  . Sexual Activity: No   Other Topics Concern  . Not on file   Social History Narrative   Living in assisted living    Past Surgical History  Procedure Laterality Date  . Abdominal hysterectomy    . Bilateral salpingoophorectomy      s/p prolapsed bladder  . Bladder surgery      prolapsed  . Thoracotomy      granuloma, pulmonary-thoracotomy  . Breast surgery  07-21-07    mastectomy (right), all margins neg and LNs neg   . Appendectomy    . Tonsillectomy    . Rotator cuff repair    . Mastectomy      right  . Breast cyst excision      left  . Mouth surgery    . Eye surgery      cataract removal bilateral  . Melanoma excision      rt clavicle Dr [facility] office)  . Lung surgery      Family History  Problem Relation Age of Onset  . Parkinsonism Father   . Coronary artery disease Father   . Breast cancer      aunt  . Colon cancer      aunt, age 47  . Uterine cancer      aunt  . Stroke Mother   .  Heart failure Mother   . Cancer Maternal Aunt     breast    Allergies  Allergen Reactions  . Azithromycin Hives, Diarrhea, Dermatitis and Rash  . Ciprofloxacin Swelling  . Levaquin [Levofloxacin] Hives  . Penicillins Hives    has had mild doses that she did not have reactions to  . Prednisone     Unknown   . Sulfamethoxazole Nausea And Vomiting    Current Outpatient Prescriptions on File Prior to Visit  Medication Sig Dispense Refill  . acetaminophen (TYLENOL) 325 MG tablet Take 650 mg by mouth every 6 (six) hours as needed for mild pain.      Marland Kitchen albuterol (PROVENTIL) (2.5 MG/3ML) 0.083% nebulizer solution Take 3 mLs (2.5 mg total) by nebulization every 6 (six) hours as needed for wheezing or shortness of breath.      . AMBULATORY NON FORMULARY MEDICATION Oxygen at bedtime-2 or 2 1/2 liters      . amLODipine (NORVASC) 2.5 MG tablet TAKE 1 TABLET (2.5 MG TOTAL) BY MOUTH DAILY. FOR HTN  90 tablet  3  . aspirin 325 MG tablet Take 325 mg by mouth daily.      . budesonide-formoterol (SYMBICORT) 160-4.5 MCG/ACT inhaler Inhale 2 puffs into the lungs 2 (two) times daily.      . diazepam (VALIUM) 5 MG tablet Take 2.5 mg by mouth at bedtime. For anxiety      . fluticasone (FLONASE) 50 MCG/ACT nasal spray Place 1 spray into both nostrils daily.      Marland Kitchen losartan (COZAAR) 50 MG tablet TAKE 1 TABLET BY MOUTH ONCE DAILY  90 tablet  3  . nitroGLYCERIN (NITROSTAT) 0.4 MG SL tablet Place 0.4 mg under the tongue every 5 (five) minutes as needed for chest pain.      Marland Kitchen omeprazole (PRILOSEC) 20 MG capsule TAKE ONE CAPSULE BY MOUTH EVERY DAY  30 capsule  11  . polyethylene glycol (MIRALAX / GLYCOLAX) packet Take 17 g by mouth daily. For constipation      . senna (SENOKOT) 8.6 MG TABS tablet Take 1 tablet by mouth daily. For constipation       No current facility-administered medications on file prior to visit.    EXAM: BP 110/72  Pulse 66  Temp(Src) 97.7 F (36.5 C) (Oral)  Resp 18  Wt 116 lb  (52.617 kg)     Objective:   Physical Exam  Nursing note and vitals reviewed. Constitutional: She is oriented to person, place, and time. She appears well-developed and well-nourished. No distress.  HENT:  Head: Normocephalic and atraumatic.  Eyes: Conjunctivae and EOM are normal. Pupils  are equal, round, and reactive to light.  Neck: Normal range of motion.  Cardiovascular: Normal rate, regular rhythm and intact distal pulses.   Pulmonary/Chest: Effort normal and breath sounds normal. No respiratory distress. She exhibits no tenderness.  Musculoskeletal: Normal range of motion. She exhibits no edema.  Neurological: She is alert and oriented to person, place, and time.  Skin: Skin is warm and dry. No rash noted. She is not diaphoretic. No erythema. No pallor.  Psychiatric: She has a normal mood and affect. Her behavior is normal. Judgment and thought content normal.    Lab Results  Component Value Date   WBC 7.8 09/28/2013   HGB 12.4 09/28/2013   HCT 37.3 09/28/2013   PLT 242.0 09/28/2013   GLUCOSE 102* 09/28/2013   CHOL 126 07/27/2013   TRIG 87 07/27/2013   HDL 25* 07/27/2013   LDLDIRECT 138.7 12/29/2007   LDLCALC 84 07/27/2013   ALT 7 07/25/2013   AST 12 07/25/2013   NA 140 09/28/2013   K 5.0 09/28/2013   CL 104 09/28/2013   CREATININE 0.8 09/28/2013   BUN 8 09/28/2013   CO2 30 09/28/2013   TSH 0.56 09/28/2013   INR 0.9 07/19/2007   HGBA1C 5.9* 07/27/2013         Assessment & Plan:  Ryka was seen today for urinary tract infection.  Diagnoses and associated orders for this visit:  Dysuria - POCT urinalysis dipstick - Culture, Urine - nitrofurantoin (MACRODANTIN) 100 MG capsule; Take 1 capsule (100 mg total) by mouth 2 (two) times daily.  UTI (lower urinary tract infection) Comments: Similar to symptoms in the past, possibly resolving with cranberry and increased fluids, will culture. Rx nitrofurantoin due to allergies to other options. - nitrofurantoin (MACRODANTIN) 100 MG  capsule; Take 1 capsule (100 mg total) by mouth 2 (two) times daily.    Pt was previously seen for abnormal chest discomfort in the morning, thought to be worsening CAD. Pt currently has appointment with cardiology next. Urged pt to keep this appointment.  Treatment may change depending on culture results.  Return precautions provided, and patient handout on UTI.  Plan to follow up as needed, or for worsening or persistent symptoms despite treatment.  Patient Instructions  Nitrofurantoin twice daily for 7 days.  If emergency symptoms discussed during visit developed, seek medical attention immediately.  Followup as needed, or for worsening or persistent symptoms despite treatment.

## 2013-11-15 NOTE — Patient Instructions (Signed)
Nitrofurantoin twice daily for 7 days.  If emergency symptoms discussed during visit developed, seek medical attention immediately.  Followup as needed, or for worsening or persistent symptoms despite treatment.   Urinary Tract Infection A urinary tract infection (UTI) can occur any place along the urinary tract. The tract includes the kidneys, ureters, bladder, and urethra. A type of germ called bacteria often causes a UTI. UTIs are often helped with antibiotic medicine.  HOME CARE   If given, take antibiotics as told by your doctor. Finish them even if you start to feel better.  Drink enough fluids to keep your pee (urine) clear or pale yellow.  Avoid tea, drinks with caffeine, and bubbly (carbonated) drinks.  Pee often. Avoid holding your pee in for a long time.  Pee before and after having sex (intercourse).  Wipe from front to back after you poop (bowel movement) if you are a woman. Use each tissue only once. GET HELP RIGHT AWAY IF:   You have back pain.  You have lower belly (abdominal) pain.  You have chills.  You feel sick to your stomach (nauseous).  You throw up (vomit).  Your burning or discomfort with peeing does not go away.  You have a fever.  Your symptoms are not better in 3 days. MAKE SURE YOU:   Understand these instructions.  Will watch your condition.  Will get help right away if you are not doing well or get worse. Document Released: 10/15/2007 Document Revised: 01/21/2012 Document Reviewed: 11/27/2011 [facility] Patient Information 2015 [place], [place]. This information is not intended to replace advice given to you by your health care provider. Make sure you discuss any questions you have with your health care provider.

## 2013-11-16 ENCOUNTER — Inpatient Hospital Stay (HOSPITAL_COMMUNITY)
Admission: EM | Admit: 2013-11-16 | Discharge: 2013-11-20 | DRG: 689 | Disposition: A | Discharge status: Skilled Nursing Facility | Payer: Medicare Other | Attending: Internal Medicine | Admitting: Internal Medicine

## 2013-11-16 ENCOUNTER — Encounter (HOSPITAL_COMMUNITY): Payer: Self-pay | Admitting: Emergency Medicine

## 2013-11-16 ENCOUNTER — Emergency Department (HOSPITAL_COMMUNITY): Payer: Medicare Other

## 2013-11-16 ENCOUNTER — Telehealth: Payer: Self-pay | Admitting: Internal Medicine

## 2013-11-16 DIAGNOSIS — N390 Urinary tract infection, site not specified: Principal | ICD-10-CM

## 2013-11-16 DIAGNOSIS — I959 Hypotension, unspecified: Secondary | ICD-10-CM | POA: Diagnosis not present

## 2013-11-16 DIAGNOSIS — Z823 Family history of stroke: Secondary | ICD-10-CM

## 2013-11-16 DIAGNOSIS — I10 Essential (primary) hypertension: Secondary | ICD-10-CM | POA: Diagnosis not present

## 2013-11-16 DIAGNOSIS — M810 Age-related osteoporosis without current pathological fracture: Secondary | ICD-10-CM | POA: Diagnosis present

## 2013-11-16 DIAGNOSIS — M6281 Muscle weakness (generalized): Secondary | ICD-10-CM | POA: Diagnosis not present

## 2013-11-16 DIAGNOSIS — R279 Unspecified lack of coordination: Secondary | ICD-10-CM | POA: Diagnosis not present

## 2013-11-16 DIAGNOSIS — R112 Nausea with vomiting, unspecified: Secondary | ICD-10-CM | POA: Diagnosis not present

## 2013-11-16 DIAGNOSIS — K219 Gastro-esophageal reflux disease without esophagitis: Secondary | ICD-10-CM | POA: Diagnosis not present

## 2013-11-16 DIAGNOSIS — K2970 Gastritis, unspecified, without bleeding: Secondary | ICD-10-CM | POA: Diagnosis not present

## 2013-11-16 DIAGNOSIS — F411 Generalized anxiety disorder: Secondary | ICD-10-CM | POA: Diagnosis not present

## 2013-11-16 DIAGNOSIS — J449 Chronic obstructive pulmonary disease, unspecified: Secondary | ICD-10-CM | POA: Diagnosis not present

## 2013-11-16 DIAGNOSIS — Z9849 Cataract extraction status, unspecified eye: Secondary | ICD-10-CM | POA: Diagnosis not present

## 2013-11-16 DIAGNOSIS — Z9981 Dependence on supplemental oxygen: Secondary | ICD-10-CM | POA: Diagnosis not present

## 2013-11-16 DIAGNOSIS — K2990 Gastroduodenitis, unspecified, without bleeding: Secondary | ICD-10-CM | POA: Diagnosis not present

## 2013-11-16 DIAGNOSIS — K8020 Calculus of gallbladder without cholecystitis without obstruction: Secondary | ICD-10-CM | POA: Diagnosis not present

## 2013-11-16 DIAGNOSIS — J438 Other emphysema: Secondary | ICD-10-CM | POA: Diagnosis present

## 2013-11-16 DIAGNOSIS — J189 Pneumonia, unspecified organism: Secondary | ICD-10-CM | POA: Diagnosis not present

## 2013-11-16 DIAGNOSIS — E860 Dehydration: Secondary | ICD-10-CM | POA: Diagnosis present

## 2013-11-16 DIAGNOSIS — Z8673 Personal history of transient ischemic attack (TIA), and cerebral infarction without residual deficits: Secondary | ICD-10-CM

## 2013-11-16 DIAGNOSIS — Z87440 Personal history of urinary (tract) infections: Secondary | ICD-10-CM

## 2013-11-16 DIAGNOSIS — I4891 Unspecified atrial fibrillation: Secondary | ICD-10-CM | POA: Diagnosis present

## 2013-11-16 DIAGNOSIS — Z853 Personal history of malignant neoplasm of breast: Secondary | ICD-10-CM

## 2013-11-16 DIAGNOSIS — Z87891 Personal history of nicotine dependence: Secondary | ICD-10-CM | POA: Diagnosis not present

## 2013-11-16 DIAGNOSIS — K7200 Acute and subacute hepatic failure without coma: Secondary | ICD-10-CM | POA: Diagnosis present

## 2013-11-16 DIAGNOSIS — K449 Diaphragmatic hernia without obstruction or gangrene: Secondary | ICD-10-CM | POA: Diagnosis present

## 2013-11-16 DIAGNOSIS — R918 Other nonspecific abnormal finding of lung field: Secondary | ICD-10-CM

## 2013-11-16 DIAGNOSIS — R748 Abnormal levels of other serum enzymes: Secondary | ICD-10-CM | POA: Diagnosis present

## 2013-11-16 DIAGNOSIS — R509 Fever, unspecified: Secondary | ICD-10-CM | POA: Diagnosis not present

## 2013-11-16 DIAGNOSIS — I7300 Raynaud's syndrome without gangrene: Secondary | ICD-10-CM

## 2013-11-16 DIAGNOSIS — J439 Emphysema, unspecified: Secondary | ICD-10-CM

## 2013-11-16 LAB — COMPREHENSIVE METABOLIC PANEL
ALBUMIN: 3.5 g/dL (ref 3.5–5.2)
ALT: 146 U/L — ABNORMAL HIGH (ref 0–35)
ANION GAP: 13 (ref 5–15)
AST: 298 U/L — ABNORMAL HIGH (ref 0–37)
Alkaline Phosphatase: 77 U/L (ref 39–117)
BILIRUBIN TOTAL: 1.4 mg/dL — AB (ref 0.3–1.2)
BUN: 16 mg/dL (ref 6–23)
CALCIUM: 8.8 mg/dL (ref 8.4–10.5)
CHLORIDE: 102 meq/L (ref 96–112)
CO2: 23 mEq/L (ref 19–32)
CREATININE: 0.74 mg/dL (ref 0.50–1.10)
GFR calc Af Amer: 85 mL/min — ABNORMAL LOW (ref >90)
GFR calc non Af Amer: 74 mL/min — ABNORMAL LOW (ref >90)
Glucose, Bld: 123 mg/dL — ABNORMAL HIGH (ref 70–99)
Potassium: 4.1 mEq/L (ref 3.7–5.3)
Sodium: 138 mEq/L (ref 137–147)
Total Protein: 6.8 g/dL (ref 6.0–8.3)

## 2013-11-16 LAB — URINE MICROSCOPIC-ADD ON

## 2013-11-16 LAB — CBC WITH DIFFERENTIAL/PLATELET
BASOS ABS: 0 10*3/uL (ref 0.0–0.1)
BASOS PCT: 0 % (ref 0–1)
Eosinophils Absolute: 0 10*3/uL (ref 0.0–0.7)
Eosinophils Relative: 0 % (ref 0–5)
HEMATOCRIT: 36.4 % (ref 36.0–46.0)
HEMOGLOBIN: 12.3 g/dL (ref 12.0–15.0)
Lymphocytes Relative: 2 % — ABNORMAL LOW (ref 12–46)
Lymphs Abs: 0.4 10*3/uL — ABNORMAL LOW (ref 0.7–4.0)
MCH: 28.5 pg (ref 26.0–34.0)
MCHC: 33.8 g/dL (ref 30.0–36.0)
MCV: 84.5 fL (ref 78.0–100.0)
MONO ABS: 0.6 10*3/uL (ref 0.1–1.0)
MONOS PCT: 4 % (ref 3–12)
NEUTROS ABS: 16.6 10*3/uL — AB (ref 1.7–7.7)
Neutrophils Relative %: 94 % — ABNORMAL HIGH (ref 43–77)
Platelets: 169 10*3/uL (ref 150–400)
RBC: 4.31 MIL/uL (ref 3.87–5.11)
RDW: 13.7 % (ref 11.5–15.5)
WBC: 17.6 10*3/uL — ABNORMAL HIGH (ref 4.0–10.5)

## 2013-11-16 LAB — URINALYSIS, ROUTINE W REFLEX MICROSCOPIC
Glucose, UA: NEGATIVE mg/dL
Hgb urine dipstick: NEGATIVE
Ketones, ur: NEGATIVE mg/dL
Nitrite: NEGATIVE
Protein, ur: NEGATIVE mg/dL
Specific Gravity, Urine: 1.018 (ref 1.005–1.030)
Urobilinogen, UA: 1 mg/dL (ref 0.0–1.0)
pH: 5 (ref 5.0–8.0)

## 2013-11-16 LAB — LIPASE, BLOOD: LIPASE: 158 U/L — AB (ref 11–59)

## 2013-11-16 MED ORDER — NITROGLYCERIN 0.4 MG SL SUBL: 0.4000 mg | SUBLINGUAL_TABLET | SUBLINGUAL | Status: DC | PRN

## 2013-11-16 MED ORDER — SODIUM CHLORIDE 0.9 % IV SOLN
INTRAVENOUS | Status: DC
  Administered 2013-11-16 – 2013-11-17 (×2): via INTRAVENOUS

## 2013-11-16 MED ORDER — ADULT MULTIVITAMIN W/MINERALS CH
1.0000 | ORAL_TABLET | Freq: Every day | ORAL | Status: DC
  Administered 2013-11-17 – 2013-11-20 (×4): 1 via ORAL
  Filled 2013-11-16 (×4): qty 1

## 2013-11-16 MED ORDER — ASPIRIN 325 MG PO TABS
325.0000 mg | ORAL_TABLET | Freq: Every day | ORAL | Status: DC
  Administered 2013-11-17 – 2013-11-20 (×4): 325 mg via ORAL
  Filled 2013-11-16 (×4): qty 1

## 2013-11-16 MED ORDER — LOSARTAN POTASSIUM 50 MG PO TABS
50.0000 mg | ORAL_TABLET | Freq: Every day | ORAL | Status: DC
  Filled 2013-11-16: qty 1

## 2013-11-16 MED ORDER — BUDESONIDE-FORMOTEROL FUMARATE 160-4.5 MCG/ACT IN AERO
2.0000 | INHALATION_SPRAY | Freq: Two times a day (BID) | RESPIRATORY_TRACT | Status: DC
  Administered 2013-11-17 – 2013-11-20 (×6): 2 via RESPIRATORY_TRACT
  Filled 2013-11-16: qty 6

## 2013-11-16 MED ORDER — DEXTROSE 5 % IV SOLN
1.0000 g | Freq: Once | INTRAVENOUS | Status: AC
  Administered 2013-11-16: 1 g via INTRAVENOUS
  Filled 2013-11-16: qty 10

## 2013-11-16 MED ORDER — DEXTROSE 5 % IV SOLN
1.0000 g | INTRAVENOUS | Status: DC
  Administered 2013-11-17 – 2013-11-19 (×3): 1 g via INTRAVENOUS
  Filled 2013-11-16 (×3): qty 10

## 2013-11-16 MED ORDER — SENNA 8.6 MG PO TABS
1.0000 | ORAL_TABLET | Freq: Every day | ORAL | Status: DC
  Administered 2013-11-17 – 2013-11-20 (×4): 8.6 mg via ORAL
  Filled 2013-11-16 (×4): qty 1

## 2013-11-16 MED ORDER — FOLIC ACID 1 MG PO TABS
1.0000 mg | ORAL_TABLET | Freq: Every day | ORAL | Status: DC
  Administered 2013-11-17 – 2013-11-20 (×4): 1 mg via ORAL
  Filled 2013-11-16 (×4): qty 1

## 2013-11-16 MED ORDER — HEPARIN SODIUM (PORCINE) 5000 UNIT/ML IJ SOLN
5000.0000 [IU] | Freq: Three times a day (TID) | INTRAMUSCULAR | Status: DC
  Administered 2013-11-16 – 2013-11-20 (×11): 5000 [IU] via SUBCUTANEOUS
  Filled 2013-11-16 (×14): qty 1

## 2013-11-16 MED ORDER — AMLODIPINE BESYLATE 2.5 MG PO TABS
2.5000 mg | ORAL_TABLET | Freq: Every day | ORAL | Status: DC
  Filled 2013-11-16: qty 1

## 2013-11-16 MED ORDER — VITAMIN B-1 100 MG PO TABS
100.0000 mg | ORAL_TABLET | Freq: Every day | ORAL | Status: DC
  Administered 2013-11-17 – 2013-11-20 (×4): 100 mg via ORAL
  Filled 2013-11-16 (×4): qty 1

## 2013-11-16 MED ORDER — ACETAMINOPHEN 325 MG PO TABS: 650.0000 mg | ORAL_TABLET | Freq: Four times a day (QID) | ORAL | Status: DC | PRN

## 2013-11-16 MED ORDER — PANTOPRAZOLE SODIUM 40 MG PO TBEC
40.0000 mg | DELAYED_RELEASE_TABLET | Freq: Every day | ORAL | Status: DC
  Administered 2013-11-17 – 2013-11-20 (×4): 40 mg via ORAL
  Filled 2013-11-16 (×4): qty 1

## 2013-11-16 MED ORDER — SODIUM CHLORIDE 0.9 % IV BOLUS (SEPSIS)
500.0000 mL | Freq: Once | INTRAVENOUS | Status: AC
Start: 2013-11-16 — End: 2013-11-16
  Administered 2013-11-16: 500 mL via INTRAVENOUS

## 2013-11-16 MED ORDER — IOHEXOL 300 MG/ML  SOLN
100.0000 mL | Freq: Once | INTRAMUSCULAR | Status: AC | PRN
  Administered 2013-11-16: 100 mL via INTRAVENOUS

## 2013-11-16 MED ORDER — ALBUTEROL SULFATE (2.5 MG/3ML) 0.083% IN NEBU: 2.5000 mg | INHALATION_SOLUTION | Freq: Four times a day (QID) | RESPIRATORY_TRACT | Status: DC | PRN

## 2013-11-16 MED ORDER — FLUTICASONE PROPIONATE 50 MCG/ACT NA SUSP
1.0000 | Freq: Every day | NASAL | Status: DC
  Administered 2013-11-17 – 2013-11-20 (×4): 1 via NASAL
  Filled 2013-11-16: qty 16

## 2013-11-16 MED ORDER — DIAZEPAM 5 MG PO TABS
2.5000 mg | ORAL_TABLET | Freq: Every day | ORAL | Status: DC
  Administered 2013-11-16 – 2013-11-19 (×4): 2.5 mg via ORAL
  Filled 2013-11-16 (×4): qty 1

## 2013-11-16 MED ORDER — MORPHINE SULFATE 2 MG/ML IJ SOLN
2.0000 mg | Freq: Once | INTRAMUSCULAR | Status: AC
  Administered 2013-11-16: 2 mg via INTRAVENOUS
  Filled 2013-11-16: qty 1

## 2013-11-16 MED ORDER — ONDANSETRON HCL 4 MG/2ML IJ SOLN
4.0000 mg | Freq: Once | INTRAMUSCULAR | Status: AC
  Administered 2013-11-16: 4 mg via INTRAVENOUS
  Filled 2013-11-16: qty 2

## 2013-11-16 MED ORDER — POLYETHYLENE GLYCOL 3350 17 G PO PACK
17.0000 g | PACK | Freq: Every day | ORAL | Status: DC
  Administered 2013-11-17 – 2013-11-20 (×4): 17 g via ORAL
  Filled 2013-11-16 (×4): qty 1

## 2013-11-16 NOTE — Telephone Encounter (Signed)
Per Rodman Key pt needs to go to E.  Called and spoke with pt and advised of Matthew's recommendations.  Pt states she was going to wait on her daughter but felt she may need EMS.  Advised pt to hang up and call them and I would call her daughter to make her aware.  Pt verbalized understanding.  Called and spoke with pt's daughter and advised that mother wanted to call EMS.  Pt's daughter was on the way to get patient and stated she would call her.

## 2013-11-16 NOTE — ED Provider Notes (Signed)
CSN: [id number]     Arrival date & time 11/16/13  1742 History   First MD Initiated Contact with Patient 11/16/13 1753     Chief Complaint  Patient presents with  . Nausea  . Emesis  . Diarrhea     (Consider location/radiation/quality/duration/timing/severity/associated sxs/prior Treatment) Patient is a 80 y.o. female presenting with vomiting and diarrhea. The history is provided by the patient.  Emesis Associated symptoms: abdominal pain   Associated symptoms: no diarrhea and no headaches   Diarrhea Associated symptoms: abdominal pain, fever and vomiting   Associated symptoms: no headaches    patient has had dysuria and feeling weak for a couple days. She's also had lower abdominal pain. She was diagnosed in her PCP 2 days ago with a urinary tract infection. To certain antibiotics. Since then she's had nausea and vomiting. No diarrhea. She's had a decreased appetite. She states she feels weak all over.  Past Medical History  Diagnosis Date  . ANXIETY 08/16/2008  . CAD, UNSPECIFIED SITE 10/17/2008  . CHOLELITHIASIS 08/23/2008  . COPD 01/04/2010    FeV1 101%, DLCO64% 2008  . 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 DIVERTICULOSIS-COLON 04/07/2008  . GERD 04/19/2007  . HYPERSOMNIA, ASSOCIATED WITH SLEEP APNEA 02/01/2007  . HYPERTENSION 11/12/2006  . MITRAL VALVE PROLAPSE, HX OF 10/07/2008  . NEOPLASM, MALIGNANT, BREAST, RIGHT 02/15/2008  . OSTEOPOROSIS 04/04/2008  . Raynaud's syndrome 04/25/2009  . Airway obstruction   . History of diverticulitis of colon   . Tortuous colon   . Anal stricture   . Chronic constipation   . Fatty liver   . Hiatal hernia   . Esophageal stricture   . IBS (irritable bowel syndrome)   . Pulmonary fibrosis   . Sleep apnea   . COPD (chronic obstructive pulmonary disease)   . Colitis     sigmoid  . Candida esophagitis   . Diverticulitis   . Hearing aid worn     bilateral  . Atrial fibrillation     she denies known hx of afib 02/24/13  .  Complication of anesthesia     difficult intubation   Past Surgical History  Procedure Laterality Date  . Abdominal hysterectomy    . Bilateral salpingoophorectomy      s/p prolapsed bladder  . Bladder surgery      prolapsed  . Thoracotomy      granuloma, pulmonary-thoracotomy  . Breast surgery  07-21-07    mastectomy (right), all margins neg and LNs neg   . Appendectomy    . Tonsillectomy    . Rotator cuff repair    . Mastectomy      right  . Breast cyst excision      left  . Mouth surgery    . Eye surgery      cataract removal bilateral  . Melanoma excision      rt clavicle Dr [facility] office)  . Lung surgery     Family History  Problem Relation Age of Onset  . Parkinsonism Father   . Coronary artery disease Father   . Breast cancer      aunt  . Colon cancer      aunt, age 58  . Uterine cancer      aunt  . Stroke Mother   . Heart failure Mother   . Cancer Maternal Aunt     breast   History  Substance Use Topics  . Smoking status: Former Smoker -- 2.00 packs/day for 30 years  Types: Cigarettes    Quit date: 05/13/1983  . Smokeless tobacco: Never Used  . Alcohol Use: Yes     Comment: 1-2 glaases wine per day   OB History   Grav Para Term Preterm Abortions TAB SAB Ect Mult Living                 Review of Systems  Constitutional: Positive for fever, activity change, appetite change and fatigue.  Eyes: Negative for pain.  Respiratory: Negative for chest tightness and shortness of breath.   Cardiovascular: Negative for chest pain and leg swelling.  Gastrointestinal: Positive for nausea, vomiting and abdominal pain. Negative for diarrhea.  Genitourinary: Negative for flank pain.  Musculoskeletal: Negative for back pain and neck stiffness.  Skin: Negative for rash.  Neurological: Negative for weakness, numbness and headaches.  Psychiatric/Behavioral: Negative for behavioral problems.      Allergies  Azithromycin; Ciprofloxacin; Levaquin;  Penicillins; Prednisone; and Sulfamethoxazole  Home Medications   Prior to Admission medications   Medication Sig Start Date End Date Taking? Authorizing Provider  acetaminophen (TYLENOL) 325 MG tablet Take 650 mg by mouth every 6 (six) hours as needed for mild pain.   Yes Historical Provider, MD  albuterol (PROVENTIL) (2.5 MG/3ML) 0.083% nebulizer solution Take 3 mLs (2.5 mg total) by nebulization every 6 (six) hours as needed for wheezing or shortness of breath. 07/27/13  Yes Modena Jansky, MD  AMBULATORY NON FORMULARY MEDICATION Oxygen at bedtime-2 or 2 1/2 liters   Yes Historical Provider, MD  amLODipine (NORVASC) 2.5 MG tablet Take 2.5 mg by mouth daily.   Yes Historical Provider, MD  aspirin 325 MG tablet Take 325 mg by mouth daily.   Yes Historical Provider, MD  budesonide-formoterol (SYMBICORT) 160-4.5 MCG/ACT inhaler Inhale 2 puffs into the lungs 2 (two) times daily.   Yes Historical Provider, MD  diazepam (VALIUM) 5 MG tablet Take 2.5 mg by mouth at bedtime. For anxiety 07/27/13  Yes Modena Jansky, MD  fluticasone (FLONASE) 50 MCG/ACT nasal spray Place 1 spray into both nostrils daily.   Yes Historical Provider, MD  losartan (COZAAR) 50 MG tablet Take 50 mg by mouth daily.   Yes Historical Provider, MD  nitrofurantoin (MACRODANTIN) 100 MG capsule Take 1 capsule (100 mg total) by mouth 2 (two) times daily. 11/15/13  Yes Zenaida Niece, PA-C  nitroGLYCERIN (NITROSTAT) 0.4 MG SL tablet Place 0.4 mg under the tongue every 5 (five) minutes as needed for chest pain.   Yes Historical Provider, MD  omeprazole (PRILOSEC) 20 MG capsule Take 20 mg by mouth daily.   Yes Historical Provider, MD  polyethylene glycol (MIRALAX / GLYCOLAX) packet Take 17 g by mouth daily. For constipation 07/27/13  Yes Modena Jansky, MD  senna (SENOKOT) 8.6 MG TABS tablet Take 1 tablet by mouth daily. For constipation 07/27/13  Yes Modena Jansky, MD   BP 96/44  Pulse 80  Temp(Src) 99.6 F (37.6 C) (Oral)   Resp 24  SpO2 92% Physical Exam  Nursing note and vitals reviewed. Constitutional: She is oriented to person, place, and time. She appears well-developed and well-nourished.  HENT:  Head: Normocephalic and atraumatic.  Eyes: EOM are normal. Pupils are equal, round, and reactive to light.  Neck: Normal range of motion. Neck supple.  Cardiovascular: Normal rate, regular rhythm and normal heart sounds.   No murmur heard. Pulmonary/Chest: Effort normal and breath sounds normal. No respiratory distress. She has no wheezes. She has no rales.  Abdominal: Soft. Bowel  sounds are normal. She exhibits no distension. There is tenderness. There is no rebound and no guarding.  Moderate lower abdominal tenderness without mass  Musculoskeletal: Normal range of motion.  Neurological: She is alert and oriented to person, place, and time. No cranial nerve deficit.  Skin: Skin is warm and dry.  Psychiatric: She has a normal mood and affect. Her speech is normal.    ED Course  Procedures (including critical care time) Labs Review Labs Reviewed  CBC WITH DIFFERENTIAL - Abnormal; Notable for the following:    WBC 17.6 (*)    Neutrophils Relative % 94 (*)    Neutro Abs 16.6 (*)    Lymphocytes Relative 2 (*)    Lymphs Abs 0.4 (*)    All other components within normal limits  COMPREHENSIVE METABOLIC PANEL - Abnormal; Notable for the following:    Glucose, Bld 123 (*)    AST 298 (*)    ALT 146 (*)    Total Bilirubin 1.4 (*)    GFR calc non Af Amer 74 (*)    GFR calc Af Amer 85 (*)    All other components within normal limits  LIPASE, BLOOD - Abnormal; Notable for the following:    Lipase 158 (*)    All other components within normal limits  URINALYSIS, ROUTINE W REFLEX MICROSCOPIC - Abnormal; Notable for the following:    Color, Urine AMBER (*)    APPearance CLOUDY (*)    Bilirubin Urine SMALL (*)    Leukocytes, UA SMALL (*)    All other components within normal limits  URINE MICROSCOPIC-ADD ON  - Abnormal; Notable for the following:    Squamous Epithelial / LPF FEW (*)    Bacteria, UA FEW (*)    All other components within normal limits    Imaging Review Dg Chest 2 View  11/16/2013   CLINICAL DATA:  Shortness of breath and fever  EXAM: CHEST  2 VIEW  COMPARISON:  Sep 28, 2013  FINDINGS: There is a degree of underlying emphysema. Scarring in the left upper and mid lung regions remain stable. There is no edema or consolidation. Heart is mildly enlarged with pulmonary vascularity within normal limits, stable. There is mid thoracic dextroscoliosis. There is mild anterior wedging of a mid thoracic vertebral body. Bones are osteoporotic. There is atherosclerotic change in the aorta.  IMPRESSION: Scarring left lung. Underlying emphysema. No edema or consolidation. Heart is prominent but stable.   Electronically Signed   By: Lowella Grip M.D.   On: 11/16/2013 19:24   Ct Abdomen Pelvis W Contrast  11/16/2013   CLINICAL DATA:  Abdominal pain with nausea and vomiting.  Fever.  EXAM: CT ABDOMEN AND PELVIS WITH CONTRAST  TECHNIQUE: Multidetector CT imaging of the abdomen and pelvis was performed using the standard protocol following bolus administration of intravenous contrast.  CONTRAST:  155mL OMNIPAQUE IOHEXOL 300 MG/ML  SOLN  COMPARISON:  CT scan dated 07/24/2013  FINDINGS: The liver, spleen, pancreas, adrenal glands, and kidneys are normal except for a 15 mm cyst in the posterior aspect of the upper pole the right kidney on a 10 mm cyst in the posterior aspect of the mid right kidney. There is single small stone in the gallbladder. No dilated bile ducts.  There is extensive diverticulosis of the distal colon. Uterus and ovaries have been removed. No free air or free fluid. Bladder appears normal.  Extensive calcification in the abdominal aorta and iliac arteries. No acute osseous abnormality. Chronic changes at  the lung bases are stable. Chronic slight cardiomegaly. Pectus excavatum deformity.   IMPRESSION: No acute abnormality of the abdomen or pelvis. Cholelithiasis. Diverticulosis.   Electronically Signed   By: Rozetta Nunnery M.D.   On: 11/16/2013 20:09     EKG Interpretation None      MDM   Final diagnoses:  UTI (lower urinary tract infection)    Patient presents with abdominal pain nausea and vomiting. Fever 102. Tender over lower abdomen. CT scan does not show acute abnormality. She does however have elevated LFTs and lipase. May have urinary tract infection. Culture from yesterday is pending. Will admit to internal medicine    Jasper Riling. Alvino Chapel, MD 11/16/13 2325

## 2013-11-16 NOTE — H&P (Signed)
[facility] History and Physical  Nykia Turko FXT:[id number] DOB: 1923-07-26 DOA: 11/16/2013  Referring physician: Davonna Belling, MD PCP: Chancy Hurter, MD   Chief Complaint: UTI  HPI: [name] [name] is a 80 y.o. female presents to the ED with fevers. Patient states that she had a birthday celebration party and had been doing fine. She however noted increased fever and chills. Patient went to her PCP and was diagnosed with a UTI and started on macrodantin. Patient states that today her symptoms got much worse with feeling cold and having chills. Patient states in addition she had had vomiting and some abdominal pain. Patient states that she had no headaches no dizziness and no syncope. Patient states that she had no CP noted. She does in addition have severe COPD and is on home oxygen presently. Patient states that she uses inhalers regularly.   Review of Systems:  Constitutional:  No weight loss, night sweats, ++Fevers, ++chills, ++fatigue.  HEENT:  No headaches, sore throat  Cardio-vascular:  No chest pain, Orthopnea, PND, swelling in lower extremities  GI:  No heartburn, indigestion, ++abdominal pain, ++nausea, ++vomiting, no diarrhea  Resp:  No shortness of breath with exertion or at rest. No coughing up of blood Skin:  no rash or lesions. GU:  ++dysuria, ++change in color of urine, ++urgency.  Musculoskeletal:  No joint pain or swelling. No decreased range of motion.++back pain.  Psych:  No change in mood or affect. No depression or anxiety. No memory loss.   Past Medical History  Diagnosis Date  . ANXIETY 08/16/2008  . CAD, UNSPECIFIED SITE 10/17/2008  . CHOLELITHIASIS 08/23/2008  . COPD 01/04/2010    FeV1 101%, DLCO64% 2008  . 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 DIVERTICULOSIS-COLON 04/07/2008  . GERD 04/19/2007  . HYPERSOMNIA, ASSOCIATED WITH SLEEP APNEA 02/01/2007  . HYPERTENSION 11/12/2006  . MITRAL VALVE PROLAPSE, HX OF 10/07/2008  . NEOPLASM,  MALIGNANT, BREAST, RIGHT 02/15/2008  . OSTEOPOROSIS 04/04/2008  . Raynaud's syndrome 04/25/2009  . Airway obstruction   . History of diverticulitis of colon   . Tortuous colon   . Anal stricture   . Chronic constipation   . Fatty liver   . Hiatal hernia   . Esophageal stricture   . IBS (irritable bowel syndrome)   . Pulmonary fibrosis   . Sleep apnea   . COPD (chronic obstructive pulmonary disease)   . Colitis     sigmoid  . Candida esophagitis   . Diverticulitis   . Hearing aid worn     bilateral  . Atrial fibrillation     she denies known hx of afib 02/24/13  . Complication of anesthesia     difficult intubation   Past Surgical History  Procedure Laterality Date  . Abdominal hysterectomy    . Bilateral salpingoophorectomy      s/p prolapsed bladder  . Bladder surgery      prolapsed  . Thoracotomy      granuloma, pulmonary-thoracotomy  . Breast surgery  07-21-07    mastectomy (right), all margins neg and LNs neg   . Appendectomy    . Tonsillectomy    . Rotator cuff repair    . Mastectomy      right  . Breast cyst excision      left  . Mouth surgery    . Eye surgery      cataract removal bilateral  . Melanoma excision      rt clavicle Dr Rhoderick Moody office)  . Lung surgery  Social History:  reports that she quit smoking about 30 years ago. Her smoking use included Cigarettes. She has a 60 pack-year smoking history. She has never used smokeless tobacco. She reports that she drinks alcohol. She reports that she does not use illicit drugs.  Allergies  Allergen Reactions  . Azithromycin Hives, Diarrhea, Dermatitis and Rash  . Ciprofloxacin Swelling  . Levaquin [Levofloxacin] Hives  . Penicillins Hives    has had mild doses that she did not have reactions to  . Prednisone     Unknown   . Sulfamethoxazole Nausea And Vomiting    Family History  Problem Relation Age of Onset  . Parkinsonism Father   . Coronary artery disease Father   . Breast cancer       aunt  . Colon cancer      aunt, age 23  . Uterine cancer      aunt  . Stroke Mother   . Heart failure Mother   . Cancer Maternal Aunt     breast     Prior to Admission medications   Medication Sig Start Date End Date Taking? Authorizing Provider  acetaminophen (TYLENOL) 325 MG tablet Take 650 mg by mouth every 6 (six) hours as needed for mild pain.   Yes Historical Provider, MD  albuterol (PROVENTIL) (2.5 MG/3ML) 0.083% nebulizer solution Take 3 mLs (2.5 mg total) by nebulization every 6 (six) hours as needed for wheezing or shortness of breath. 07/27/13  Yes Modena Jansky, MD  AMBULATORY NON FORMULARY MEDICATION Oxygen at bedtime-2 or 2 1/2 liters   Yes Historical Provider, MD  amLODipine (NORVASC) 2.5 MG tablet Take 2.5 mg by mouth daily.   Yes Historical Provider, MD  aspirin 325 MG tablet Take 325 mg by mouth daily.   Yes Historical Provider, MD  budesonide-formoterol (SYMBICORT) 160-4.5 MCG/ACT inhaler Inhale 2 puffs into the lungs 2 (two) times daily.   Yes Historical Provider, MD  diazepam (VALIUM) 5 MG tablet Take 2.5 mg by mouth at bedtime. For anxiety 07/27/13  Yes Modena Jansky, MD  fluticasone (FLONASE) 50 MCG/ACT nasal spray Place 1 spray into both nostrils daily.   Yes Historical Provider, MD  losartan (COZAAR) 50 MG tablet Take 50 mg by mouth daily.   Yes Historical Provider, MD  nitrofurantoin (MACRODANTIN) 100 MG capsule Take 1 capsule (100 mg total) by mouth 2 (two) times daily. 11/15/13  Yes Zenaida Niece, PA-C  nitroGLYCERIN (NITROSTAT) 0.4 MG SL tablet Place 0.4 mg under the tongue every 5 (five) minutes as needed for chest pain.   Yes Historical Provider, MD  omeprazole (PRILOSEC) 20 MG capsule Take 20 mg by mouth daily.   Yes Historical Provider, MD  polyethylene glycol (MIRALAX / GLYCOLAX) packet Take 17 g by mouth daily. For constipation 07/27/13  Yes Modena Jansky, MD  senna (SENOKOT) 8.6 MG TABS tablet Take 1 tablet by mouth daily. For constipation 07/27/13   Yes Modena Jansky, MD   Physical Exam: Filed Vitals:   11/16/13 2004  BP: 104/44  Pulse: 79  Temp: 99.6 F (37.6 C)  Resp: 26    BP 104/44  Pulse 79  Temp(Src) 99.6 F (37.6 C) (Oral)  Resp 26  SpO2 100%  General:  Appears calm and comfortable Eyes: PERRL, normal lids, irises & conjunctiva ENT: grossly normal hearing, lips & tongue Neck: no LAD, masses or thyromegaly Cardiovascular: RRR, no m/r/g. No LE edema. Respiratory: CTA bilaterally, no w/r/r. Normal respiratory effort. Abdomen: soft, some suprapubic  tenderness is noted Skin: no rash or induration seen on limited exam Musculoskeletal: grossly normal tone BUE/BLE Psychiatric: grossly normal mood and affect, speech fluent and appropriate Neurologic: grossly non-focal.          Labs on Admission:  Basic Metabolic Panel:  Recent Labs Lab 11/16/13 1826  NA 138  K 4.1  CL 102  CO2 23  GLUCOSE 123*  BUN 16  CREATININE 0.74  CALCIUM 8.8   Liver Function Tests:  Recent Labs Lab 11/16/13 1826  AST 298*  ALT 146*  ALKPHOS 77  BILITOT 1.4*  PROT 6.8  ALBUMIN 3.5    Recent Labs Lab 11/16/13 1826  LIPASE 158*   No results found for this basename: AMMONIA,  in the last 168 hours CBC:  Recent Labs Lab 11/16/13 1826  WBC 17.6*  NEUTROABS 16.6*  HGB 12.3  HCT 36.4  MCV 84.5  PLT 169   Cardiac Enzymes: No results found for this basename: CKTOTAL, CKMB, CKMBINDEX, TROPONINI,  in the last 168 hours  BNP (last 3 results)  Recent Labs  07/13/13 1454 07/24/13 2153  PROBNP 489.0* 226.8   CBG: No results found for this basename: GLUCAP,  in the last 168 hours  Radiological Exams on Admission: Dg Chest 2 View  11/16/2013   CLINICAL DATA:  Shortness of breath and fever  EXAM: CHEST  2 VIEW  COMPARISON:  Sep 28, 2013  FINDINGS: There is a degree of underlying emphysema. Scarring in the left upper and mid lung regions remain stable. There is no edema or consolidation. Heart is mildly enlarged  with pulmonary vascularity within normal limits, stable. There is mid thoracic dextroscoliosis. There is mild anterior wedging of a mid thoracic vertebral body. Bones are osteoporotic. There is atherosclerotic change in the aorta.  IMPRESSION: Scarring left lung. Underlying emphysema. No edema or consolidation. Heart is prominent but stable.   Electronically Signed   By: Lowella Grip M.D.   On: 11/16/2013 19:24   Ct Abdomen Pelvis W Contrast  11/16/2013   CLINICAL DATA:  Abdominal pain with nausea and vomiting.  Fever.  EXAM: CT ABDOMEN AND PELVIS WITH CONTRAST  TECHNIQUE: Multidetector CT imaging of the abdomen and pelvis was performed using the standard protocol following bolus administration of intravenous contrast.  CONTRAST:  190mL OMNIPAQUE IOHEXOL 300 MG/ML  SOLN  COMPARISON:  CT scan dated 07/24/2013  FINDINGS: The liver, spleen, pancreas, adrenal glands, and kidneys are normal except for a 15 mm cyst in the posterior aspect of the upper pole the right kidney on a 10 mm cyst in the posterior aspect of the mid right kidney. There is single small stone in the gallbladder. No dilated bile ducts.  There is extensive diverticulosis of the distal colon. Uterus and ovaries have been removed. No free air or free fluid. Bladder appears normal.  Extensive calcification in the abdominal aorta and iliac arteries. No acute osseous abnormality. Chronic changes at the lung bases are stable. Chronic slight cardiomegaly. Pectus excavatum deformity.  IMPRESSION: No acute abnormality of the abdomen or pelvis. Cholelithiasis. Diverticulosis.   Electronically Signed   By: Rozetta Nunnery M.D.   On: 11/16/2013 20:09     Assessment/Plan Active Problems:   HYPERTENSION   COPD with emphysema Gold B   GERD   Recurrent UTI   UTI (lower urinary tract infection)   1. UTI -patient essentially failed outpatient therapy will admit for IV therapy -started on rocephin -will await cultures -hydrate as  tolerated  2. Hypertension -  blood pressure controlled -will continue with home medications  3. COPD -severe by history -will continue with oxygen therapy -continue with home medications  4. GERD -will continue with PPIs  5. Gallstone -also noted on CT scan is a single gallstone -will need ongoing follow up does not appear to have infection presently  6. Elevated Lipase and LFTs -again noted to have gallstone -will get ultrasound of abdomen    Code Status: Full Code (must indicate code status--if unknown or must be presumed, indicate so) Family Communication: Daughter (indicate person spoken with, if applicable, with phone number if by telephone) Disposition Plan: Home (indicate anticipated LOS)  Time spent: 60min  KHAN,SAADAT A [facility] Pager [phone]  **Disclaimer: This note may have been dictated with voice recognition software. Similar sounding words can inadvertently be transcribed and this note may contain transcription errors which may not have been corrected upon publication of note.**

## 2013-11-16 NOTE — Telephone Encounter (Signed)
Patient Information:  Caller Name: Jalyric  Phone: [phone]  Patient: [name] [name]  Gender: Female  DOB: 10/13/1923  Age: 80 Years  PCP: Kela Millin (only sees ages 36 and up)  Office Follow Up:  Does the office need to follow up with this patient?: Yes  Instructions For The Office: Please follow up with patient regarding any other options.  She does not want to go to ED.   Symptoms  Reason For Call & Symptoms: Patient reports vomiting since starting on antibiotics for UTI.  She reports chills; vomiting water, not able to keep liquids down.  Last voided estimated 13:45, very small amount.  She sounds dry mouthed and reports feeling same; reports she vomits every time she drinks water.  Emergent  symptoms ruled out.  Go to ED Now or to Office with PCP Approval due to Patient sounds very sick or weak to triager.  Note to office for final disposition.  Caller states she cannot get to office before closing; her daughter will be there after office closes.  Reviewed Health History In EMR: Yes  Reviewed Medications In EMR: Yes  Reviewed Allergies In EMR: Yes  Reviewed Surgeries / Procedures: Yes  Date of Onset of Symptoms: 11/16/2013  Treatments Tried: Ice chips, sips of  liquids  Treatments Tried Worked: No  Any Fever: Yes  Fever Taken: Tactile  Fever Time Of Reading: 14:45:00  Fever Last Reading: N/A  Guideline(s) Used:  Urinalysis Results Follow-Up Call  Urination Pain - Female  Disposition Per Guideline:   Go to ED Now (or to Office with PCP Approval)  Reason For Disposition Reached:   Patient sounds very sick or weak to the triager  Advice Given:  Fluids:   Drink extra fluids. Drink 8-10 glasses of liquids a day (Reason: to produce a dilute, non-irritating urine).  Call Back If:   You become worse.  Patient Refused Recommendation:  Patient Refused Care Advice  Patient states her daughter will not be with her until 5 pm.  Patient refuses the idea of going to ED for  evaluation.  Please follow up with her regarding any other options that may be available.  Thank you.

## 2013-11-16 NOTE — ED Notes (Addendum)
Patient is from [facility] assisted living. Patient c/o abdominal pain, and NV since today. Patient went to MD yesterday and diagnosed with UTI. Patient was given zofran and 250 fluids in route.

## 2013-11-17 ENCOUNTER — Observation Stay (HOSPITAL_COMMUNITY): Payer: Medicare Other

## 2013-11-17 DIAGNOSIS — K8020 Calculus of gallbladder without cholecystitis without obstruction: Secondary | ICD-10-CM | POA: Diagnosis not present

## 2013-11-17 LAB — COMPREHENSIVE METABOLIC PANEL
ALT: 117 U/L — ABNORMAL HIGH (ref 0–35)
AST: 153 U/L — AB (ref 0–37)
Albumin: 3 g/dL — ABNORMAL LOW (ref 3.5–5.2)
Alkaline Phosphatase: 65 U/L (ref 39–117)
Anion gap: 13 (ref 5–15)
BUN: 16 mg/dL (ref 6–23)
CHLORIDE: 103 meq/L (ref 96–112)
CO2: 20 mEq/L (ref 19–32)
Calcium: 8.5 mg/dL (ref 8.4–10.5)
Creatinine, Ser: 0.79 mg/dL (ref 0.50–1.10)
GFR calc Af Amer: 84 mL/min — ABNORMAL LOW (ref >90)
GFR, EST NON AFRICAN AMERICAN: 72 mL/min — AB (ref >90)
Glucose, Bld: 105 mg/dL — ABNORMAL HIGH (ref 70–99)
Potassium: 3.8 mEq/L (ref 3.7–5.3)
Sodium: 136 mEq/L — ABNORMAL LOW (ref 137–147)
Total Bilirubin: 0.6 mg/dL (ref 0.3–1.2)
Total Protein: 6 g/dL (ref 6.0–8.3)

## 2013-11-17 LAB — CBC
HCT: 34.5 % — ABNORMAL LOW (ref 36.0–46.0)
Hemoglobin: 11.4 g/dL — ABNORMAL LOW (ref 12.0–15.0)
MCH: 28.2 pg (ref 26.0–34.0)
MCHC: 33 g/dL (ref 30.0–36.0)
MCV: 85.4 fL (ref 78.0–100.0)
Platelets: 164 10*3/uL (ref 150–400)
RBC: 4.04 MIL/uL (ref 3.87–5.11)
RDW: 14.1 % (ref 11.5–15.5)
WBC: 17.4 10*3/uL — ABNORMAL HIGH (ref 4.0–10.5)

## 2013-11-17 LAB — HEPATITIS PANEL, ACUTE
HCV Ab: NEGATIVE
HEP A IGM: NONREACTIVE
Hep B C IgM: NONREACTIVE
Hepatitis B Surface Ag: NEGATIVE

## 2013-11-17 LAB — HEMOGLOBIN A1C
Hgb A1c MFr Bld: 5.6 % (ref <5.7)
MEAN PLASMA GLUCOSE: 114 mg/dL (ref <117)

## 2013-11-17 LAB — URINE CULTURE
Colony Count: NO GROWTH
Organism ID, Bacteria: NO GROWTH

## 2013-11-17 LAB — TSH: TSH: 1.23 u[IU]/mL (ref 0.350–4.500)

## 2013-11-17 LAB — LIPASE, BLOOD: Lipase: 93 U/L — ABNORMAL HIGH (ref 11–59)

## 2013-11-17 MED ORDER — BIOTENE DRY MOUTH MT LIQD
15.0000 mL | Freq: Two times a day (BID) | OROMUCOSAL | Status: DC
  Administered 2013-11-17 – 2013-11-20 (×7): 15 mL via OROMUCOSAL

## 2013-11-17 NOTE — Progress Notes (Signed)
Clinical Social Work Department BRIEF PSYCHOSOCIAL ASSESSMENT 11/17/2013  Patient:  [name] [name], [name] [name]     Account Number:  [id number]     Admit date:  11/16/2013  Clinical Social Worker:  Earlie Server  Date/Time:  11/17/2013 03:00 PM  Referred by:  Physician  Date Referred:  11/17/2013 Referred for  Psychosocial assessment   Other Referral:   Interview type:  Patient Other interview type:    PSYCHOSOCIAL DATA Living Status:  FACILITY Admitted from facility:   Level of care:  Independent Living Primary support name:  [name] Primary support relationship to patient:  CHILD, ADULT Degree of support available:   Strong    CURRENT CONCERNS Current Concerns  Post-Acute Placement   Other Concerns:    SOCIAL WORK ASSESSMENT / PLAN CSW received referral due to patient being admitted from a facility. CSW reviewed chart and met with patient at bedside. CSW introduced myself and explained role.    Patient reports she has been living at [facility] for about 1 year. Patient states it is an independent living facility and that she is able to clean, bathe and dress herself. Patient states that she walks to the dining room for meals but that dining room is far away from her apartment. Patient states she has been to [facility] in the past and feels it would be best to return for a few weeks of rehab. CSW provided SNF list and explained DC plans. Patient agreeable to [facility] search in case [facility] is not available.    CSW completed FL2 and faxed out. CSW will follow up with bed offers.   Assessment/plan status:  Psychosocial Support/Ongoing Assessment of Needs Other assessment/ plan:   Information/referral to community resources:   SNF list    PATIENT'S/FAMILY'S RESPONSE TO PLAN OF CARE: Patient alert and oriented. Patient reports that dtr is very involved but that she is busy herself. Patient is worried that dtr will cancel her upcoming trip in order to care for her so  is considering SNF so that dtr will not worry. Patient reports she will discuss plans with dtr and agreeable for CSW to follow up.       [place], [place] [phone]

## 2013-11-17 NOTE — Progress Notes (Signed)
PT Cancellation Note  Patient Details Name: Lamica [name] MRN: [id number] DOB: 06/12/1923   Cancelled Treatment:    Reason Eval/Treat Not Completed: Attempted PT eval-pt declined to participate at this time-just received her breakfast tray. Will check back later as schedule permits.    [name], MPT Pager: [phone]

## 2013-11-17 NOTE — Progress Notes (Addendum)
Clinical Social Work Department CLINICAL SOCIAL WORK PLACEMENT NOTE 11/17/2013  Patient:  [name] [name], [name] [name]  Account Number:  [id number] Admit date:  11/16/2013  Clinical Social Worker:  Sindy Messing, LCSW  Date/time:  11/17/2013 03:00 PM  Clinical Social Work is seeking post-discharge placement for this patient at the following level of care:   [place]   (*CSW will update this form in Epic as items are completed)   11/17/2013  Patient/family provided with [facility] Department of Clinical Social Work's list of facilities offering this level of care within the geographic area requested by the patient (or if unable, by the patient's family).  11/17/2013  Patient/family informed of their freedom to choose among providers that offer the needed level of care, that participate in Medicare, Medicaid or managed care program needed by the patient, have an available bed and are willing to accept the patient.  11/17/2013  Patient/family informed of [facility]' ownership interest in [facility], as well as of the fact that they are under no obligation to receive care at this facility.  PASARR submitted to EDS on existing # PASARR number received on   FL2 transmitted to all facilities in geographic area requested by pt/family on  11/17/2013 FL2 transmitted to all facilities within larger geographic area on   Patient informed that his/her managed care company has contracts with or will negotiate with  certain facilities, including the following:     Patient/family informed of bed offers received:  11/18/13 Patient chooses bed at [facility] Physician recommends and patient chooses bed at    Patient to be transferred to  [facility] on   Patient to be transferred to facility by  Patient and family notified of transfer on  Name of family member notified:    The following physician request were entered in Epic:   Additional Comments:

## 2013-11-17 NOTE — Progress Notes (Signed)
UR Completed Carlie Corpus Graves-Bigelow, RN,BSN [phone]  

## 2013-11-17 NOTE — Progress Notes (Signed)
[facility] PROGRESS NOTE  Lucero Auzenne POE:[id number] DOB: 10/20/1923 DOA: 11/16/2013 PCP: Chancy Hurter, MD  Assessment/Plan: Active Problems:   HYPERTENSION   COPD with emphysema Gold B   GERD   Recurrent UTI   UTI (lower urinary tract infection)    Acute hepatitis/elevated lipase CT scan negative Because of elevated liver function the patient had a right upper quadrant ultrasound that shows cholelithiasis but no evidence of acute cholecystitis Continue to closely follow liver function and lipase, both of these are already improving Patient could have had an episode of viral gastroenteritis  Hypertension Discontinued Norvasc and losartan because of low blood pressure Patient dehydrated and therefore we'll continue with IV fluids   Urinary tract infection Continue Rocephin  Blood culture ordered and pending  History of atrial fibrillation Given low blood pressure Will monitor on telemetry, in sinus rhythm  COPD Continue Symbicort and Flonase    Code Status: full Family Communication: family updated about patient's clinical progress Disposition Plan: Possible discharge on today's  Brief narrative: 80 y.o. female presents to the ED with fevers. Patient states that she had a birthday celebration party and had been doing fine. She however noted increased fever and chills. Patient went to her PCP and was diagnosed with a UTI and started on macrodantin. Patient states that today her symptoms got much worse with feeling cold and having chills. Patient states in addition she had had vomiting and some abdominal pain. Patient states that she had no headaches no dizziness and no syncope. Patient states that she had no CP noted. She does in addition have severe COPD and is on home oxygen presently. Patient states that she uses inhalers regularly.   Consultants:  None  Procedures:  None  Antibiotics:  Rocephin  HPI/Subjective: Complaining of epigastric pain,  denies any nausea vomiting, denies any hematochezia or melena  Objective: Filed Vitals:   11/16/13 2004 11/16/13 2243 11/16/13 2300 11/17/13 0551  BP: 104/44 96/44 106/68 92/54  Pulse: 79 80 78 65  Temp: 99.6 F (37.6 C)  98.6 F (37 C) 97.8 F (36.6 C)  TempSrc: Oral  Oral Oral  Resp: 26 24 20 18   Height:   5\' 2"  (1.575 m)   Weight:   54.658 kg (120 lb 8 oz)   SpO2: 100% 92% 96% 95%    Intake/Output Summary (Last 24 hours) at 11/17/13 1014 Last data filed at 11/17/13 0320  Gross per 24 hour  Intake      0 ml  Output      2 ml  Net     -2 ml    Exam:  HENT:  Head: Atraumatic.  Nose: Nose normal.  Mouth/Throat: Oropharynx is clear and moist.  Eyes: Conjunctivae are normal. Pupils are equal, round, and reactive to light. No scleral icterus.  Neck: Neck supple. No tracheal deviation present.  Cardiovascular: Normal rate, regular rhythm, normal heart sounds and intact distal pulses.  Pulmonary/Chest: Effort normal and breath sounds normal. No respiratory distress.  Abdominal: Soft. Normal appearance and bowel sounds are normal. She exhibits no distension. There is no tenderness.  Musculoskeletal: She exhibits no edema and no tenderness.  Neurological: She is alert. No cranial nerve deficit.    Data Reviewed: Basic Metabolic Panel:  Recent Labs Lab 11/16/13 1826 11/17/13 0500  NA 138 136*  K 4.1 3.8  CL 102 103  CO2 23 20  GLUCOSE 123* 105*  BUN 16 16  CREATININE 0.74 0.79  CALCIUM 8.8 8.5    Liver  Function Tests:  Recent Labs Lab 11/16/13 1826 11/17/13 0500  AST 298* 153*  ALT 146* 117*  ALKPHOS 77 65  BILITOT 1.4* 0.6  PROT 6.8 6.0  ALBUMIN 3.5 3.0*    Recent Labs Lab 11/16/13 1826 11/17/13 0815  LIPASE 158* 93*   No results found for this basename: AMMONIA,  in the last 168 hours  CBC:  Recent Labs Lab 11/16/13 1826 11/17/13 0500  WBC 17.6* 17.4*  NEUTROABS 16.6*  --   HGB 12.3 11.4*  HCT 36.4 34.5*  MCV 84.5 85.4  PLT 169 164     Cardiac Enzymes: No results found for this basename: CKTOTAL, CKMB, CKMBINDEX, TROPONINI,  in the last 168 hours BNP (last 3 results)  Recent Labs  07/13/13 1454 07/24/13 2153  PROBNP 489.0* 226.8     CBG: No results found for this basename: GLUCAP,  in the last 168 hours  No results found for this or any previous visit (from the past 240 hour(s)).   Studies: Dg Chest 2 View  11/16/2013   CLINICAL DATA:  Shortness of breath and fever  EXAM: CHEST  2 VIEW  COMPARISON:  Sep 28, 2013  FINDINGS: There is a degree of underlying emphysema. Scarring in the left upper and mid lung regions remain stable. There is no edema or consolidation. Heart is mildly enlarged with pulmonary vascularity within normal limits, stable. There is mid thoracic dextroscoliosis. There is mild anterior wedging of a mid thoracic vertebral body. Bones are osteoporotic. There is atherosclerotic change in the aorta.  IMPRESSION: Scarring left lung. Underlying emphysema. No edema or consolidation. Heart is prominent but stable.   Electronically Signed   By: Lowella Grip M.D.   On: 11/16/2013 19:24   Ct Abdomen Pelvis W Contrast  11/16/2013   CLINICAL DATA:  Abdominal pain with nausea and vomiting.  Fever.  EXAM: CT ABDOMEN AND PELVIS WITH CONTRAST  TECHNIQUE: Multidetector CT imaging of the abdomen and pelvis was performed using the standard protocol following bolus administration of intravenous contrast.  CONTRAST:  164mL OMNIPAQUE IOHEXOL 300 MG/ML  SOLN  COMPARISON:  CT scan dated 07/24/2013  FINDINGS: The liver, spleen, pancreas, adrenal glands, and kidneys are normal except for a 15 mm cyst in the posterior aspect of the upper pole the right kidney on a 10 mm cyst in the posterior aspect of the mid right kidney. There is single small stone in the gallbladder. No dilated bile ducts.  There is extensive diverticulosis of the distal colon. Uterus and ovaries have been removed. No free air or free fluid. Bladder appears  normal.  Extensive calcification in the abdominal aorta and iliac arteries. No acute osseous abnormality. Chronic changes at the lung bases are stable. Chronic slight cardiomegaly. Pectus excavatum deformity.  IMPRESSION: No acute abnormality of the abdomen or pelvis. Cholelithiasis. Diverticulosis.   Electronically Signed   By: Rozetta Nunnery M.D.   On: 11/16/2013 20:09   US Abdomen Limited Ruq  11/17/2013   CLINICAL DATA:  Abnormal liver function tests  EXAM: US ABDOMEN LIMITED - RIGHT UPPER QUADRANT  COMPARISON:  None.  FINDINGS: Gallbladder:  Within the gallbladder, there are several echogenic foci which move and shadow consistent with gallstones. Largest gallstone measures 7 mm in length. There is no gallbladder wall thickening or pericholecystic fluid. No sonographic Murphy sign noted.  Common bile duct:  Diameter: 5 mm. There is no intrahepatic or extrahepatic biliary duct dilatation.  Liver:  No focal lesion identified. Within normal limits in parenchymal  echogenicity.  IMPRESSION: Cholelithiasis.  Study otherwise unremarkable.   Electronically Signed   By: Lowella Grip M.D.   On: 11/17/2013 10:05    Scheduled Meds: . antiseptic oral rinse  15 mL Mouth Rinse BID  . aspirin  325 mg Oral Daily  . budesonide-formoterol  2 puff Inhalation BID  . cefTRIAXone (ROCEPHIN)  IV  1 g Intravenous Q24H  . diazepam  2.5 mg Oral QHS  . fluticasone  1 spray Each Nare Daily  . folic acid  1 mg Oral Daily  . heparin  5,000 Units Subcutaneous 3 times per day  . multivitamin with minerals  1 tablet Oral Daily  . pantoprazole  40 mg Oral Daily  . polyethylene glycol  17 g Oral Daily  . senna  1 tablet Oral Daily  . thiamine  100 mg Oral Daily   Continuous Infusions: . sodium chloride 100 mL/hr at 11/17/13 8315    Active Problems:   HYPERTENSION   COPD with emphysema Gold B   GERD   Recurrent UTI   UTI (lower urinary tract infection)    Time spent: 40 minutes   [facility] Pager [phone]. If 8PM-8AM, please contact night-coverage at [email or web address], password [facility] 11/17/2013, 10:14 AM  LOS: 1 day

## 2013-11-17 NOTE — Evaluation (Signed)
Physical Therapy Evaluation Patient Details Name: Shealyn Sean MRN: [id number] DOB: Apr 22, 1924 Today's Date: 11/17/2013   History of Present Illness  80 yo female admitted with UTI. Hx of anxiety, COPD, osteoporosis, HTN, breast cancer, A fib. Pt is from [facility].   Clinical Impression  On eval, pt required Min guard assist for mobility-able to ambulate ~100 feet with walker. Demonstrates general weakness and decreased activity tolerance. Fluctuating sat reading during ambulation: 89-98% on RA. Discussed d/c plan-pt is considering ST rehab prior to returning to home alone (independent living). Will follow during stay to see how pt progresses.     Follow Up Recommendations SNF (unless mobility and activity tolerance improves. Pt is considering rehab. )    Equipment Recommendations  None recommended by PT    Recommendations for Other Services       Precautions / Restrictions Precautions Precautions: Fall Restrictions Weight Bearing Restrictions: No      Mobility  Bed Mobility               General bed mobility comments: pt sitting in recliner  Transfers Overall transfer level: Needs assistance Equipment used: Rolling walker (2 wheeled) Transfers: Sit to/from Stand Sit to Stand: Min guard         General transfer comment: close guard for safety  Ambulation/Gait Ambulation/Gait assistance: Min guard Ambulation Distance (Feet): 100 Feet Assistive device: Rolling walker (2 wheeled) Gait Pattern/deviations: Step-through pattern     General Gait Details: slow gait speed. fatigues easily. close guard for safety  Stairs            Wheelchair Mobility    Modified Rankin (Stroke Patients Only)       Balance Overall balance assessment: Needs assistance         Standing balance support: Bilateral upper extremity supported;During functional activity Standing balance-Leahy Scale: Poor                               Pertinent Vitals/Pain 93%  RA rest 89-98% RA during ambulation 91% RA at rest    Home Living       Type of Home: Independent living facility Home Access: Level entry     Home Layout: One level Home Equipment: Walker - 4 wheels      Prior Function Level of Independence: Independent with assistive device(s)               Hand Dominance        Extremity/Trunk Assessment   Upper Extremity Assessment: Generalized weakness           Lower Extremity Assessment: Generalized weakness      Cervical / Trunk Assessment: Kyphotic  Communication   Communication: HOH  Cognition Arousal/Alertness: Awake/alert Behavior During Therapy: WFL for tasks assessed/performed Overall Cognitive Status: Within Functional Limits for tasks assessed                      General Comments      Exercises        Assessment/Plan    PT Assessment Patient needs continued PT services  PT Diagnosis Difficulty walking;Generalized weakness   PT Problem List Decreased strength;Decreased activity tolerance;Decreased balance;Decreased mobility  PT Treatment Interventions DME instruction;Gait training;Functional mobility training;Therapeutic activities;Therapeutic exercise;Patient/family education;Balance training   PT Goals (Current goals can be found in the Care Plan section) Acute Rehab PT Goals Patient Stated Goal: to return to PLOF PT Goal Formulation: With patient Time  For Goal Achievement: 12/01/13 Potential to Achieve Goals: Good    Frequency Min 3X/week   Barriers to discharge        Co-evaluation               End of Session Equipment Utilized During Treatment: Gait belt Activity Tolerance: Patient limited by fatigue Patient left: in chair;with call bell/phone within reach           Time: 1422-1440 PT Time Calculation (min): 18 min   Charges:   PT Evaluation $Initial PT Evaluation Tier I: 1 Procedure PT Treatments $Gait Training: 8-22 mins   PT G Codes:           [name], MPT Pager: [phone]

## 2013-11-17 NOTE — Progress Notes (Signed)
Dtr is at beside. She and pt state that she does not want to go to  facility if at all possible. If nursing facility is medically necessary they are willing to go. Dtr states pt has electric wheelchair and can have meals brought in to pt if needed at her current residence.

## 2013-11-17 NOTE — Progress Notes (Signed)
INITIAL NUTRITION ASSESSMENT  DOCUMENTATION CODES Per approved criteria  -Not Applicable   INTERVENTION: - Ensure Complete BID - Encouraged increased meal intake - RD to monitor plan of care   NUTRITION DIAGNOSIS: Inadequate oral intake related to poor appetite as evidenced by pt report.   Goal: Pt to consume >90% of meals/supplements  Monitor:  Weights, labs, intake  Reason for Assessment: Malnutrition screening tool   80 y.o. female  Admitting Dx: Urinary tract infection   ASSESSMENT: Pt from [facility], admitted with abdominal pain, nausea, and vomiting that started yesterday. Was recently diagnosed with a urinary tract infection.   - Met with pt who reports she hasn't eaten anything in the past 2 days so that's why she only had a few bites of her breakfast this morning - States before she got sick, she had a good appetite and was eating "too much" - Eats coffee/snack for breakfast and a well balanced lunch and dinner - Not on any nutritional supplements PTA - Denies any weight loss - Denies any nausea/vomiting this morning  Lipase elevated but trending down AST/ATL elevated   Nutrition Focused Physical Exam:  Subcutaneous Fat:  Orbital Region: wnl Upper Arm Region: wnl Thoracic and Lumbar Region: wnl  Muscle:  Temple Region: wnl Clavicle Bone Region: mild wasting Clavicle and Acromion Bone Region: mild wasting Scapular Bone Region: wnl Dorsal Hand: wnl Patellar Region: wnl Anterior Thigh Region: wnl Posterior Calf Region: wnl  Edema: None noted    Height: Ht Readings from Last 1 Encounters:  11/16/13 $RemoveB'5\' 2"'CAPxhKNJ$  (1.575 m)    Weight: Wt Readings from Last 1 Encounters:  11/16/13 120 lb 8 oz (54.658 kg)    Ideal Body Weight: 110 lbs   % Ideal Body Weight: 109%  Wt Readings from Last 10 Encounters:  11/16/13 120 lb 8 oz (54.658 kg)  11/15/13 116 lb (52.617 kg)  09/28/13 117 lb (53.071 kg)  09/21/13 113 lb (51.256 kg)   08/31/13 115 lb 9.6 oz (52.436 kg)  07/29/13 117 lb (53.071 kg)  07/24/13 116 lb 6.5 oz (52.8 kg)  07/18/13 120 lb 14.4 oz (54.84 kg)  07/13/13 119 lb (53.978 kg)  07/12/13 119 lb (53.978 kg)    Usual Body Weight: 114-115 lbs per pt   % Usual Body Weight: 104-105%  BMI:  Body mass index is 22.03 kg/(m^2).  Estimated Nutritional Needs: Kcal: 1400-1600 Protein: 65-80g Fluid: 1.4-1.6L/day   Skin: Intact   Diet Order: Cardiac  EDUCATION NEEDS: -No education needs identified at this time   Intake/Output Summary (Last 24 hours) at 11/17/13 1135 Last data filed at 11/17/13 0320  Gross per 24 hour  Intake      0 ml  Output      2 ml  Net     -2 ml    Last BM: 7/7  Labs:   Recent Labs Lab 11/16/13 1826 11/17/13 0500  NA 138 136*  K 4.1 3.8  CL 102 103  CO2 23 20  BUN 16 16  CREATININE 0.74 0.79  CALCIUM 8.8 8.5  GLUCOSE 123* 105*    CBG (last 3)  No results found for this basename: GLUCAP,  in the last 72 hours  Scheduled Meds: . antiseptic oral rinse  15 mL Mouth Rinse BID  . aspirin  325 mg Oral Daily  . budesonide-formoterol  2 puff Inhalation BID  . cefTRIAXone (ROCEPHIN)  IV  1 g Intravenous Q24H  . diazepam  2.5 mg Oral QHS  .  fluticasone  1 spray Each Nare Daily  . folic acid  1 mg Oral Daily  . heparin  5,000 Units Subcutaneous 3 times per day  . multivitamin with minerals  1 tablet Oral Daily  . pantoprazole  40 mg Oral Daily  . polyethylene glycol  17 g Oral Daily  . senna  1 tablet Oral Daily  . thiamine  100 mg Oral Daily    Continuous Infusions: . sodium chloride 100 mL/hr at 11/17/13 3276    Past Medical History  Diagnosis Date  . ANXIETY 08/16/2008  . CAD, UNSPECIFIED SITE 10/17/2008  . CHOLELITHIASIS 08/23/2008  . COPD 01/04/2010    FeV1 101%, DLCO64% 2008  . 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 DIVERTICULOSIS-COLON 04/07/2008  . GERD 04/19/2007  . HYPERSOMNIA, ASSOCIATED WITH SLEEP APNEA 02/01/2007  . HYPERTENSION  11/12/2006  . MITRAL VALVE PROLAPSE, HX OF 10/07/2008  . NEOPLASM, MALIGNANT, BREAST, RIGHT 02/15/2008  . OSTEOPOROSIS 04/04/2008  . Raynaud's syndrome 04/25/2009  . Airway obstruction   . History of diverticulitis of colon   . Tortuous colon   . Anal stricture   . Chronic constipation   . Fatty liver   . Hiatal hernia   . Esophageal stricture   . IBS (irritable bowel syndrome)   . Pulmonary fibrosis   . Sleep apnea   . COPD (chronic obstructive pulmonary disease)   . Colitis     sigmoid  . Candida esophagitis   . Diverticulitis   . Hearing aid worn     bilateral  . Atrial fibrillation     she denies known hx of afib 02/24/13  . Complication of anesthesia     difficult intubation    Past Surgical History  Procedure Laterality Date  . Abdominal hysterectomy    . Bilateral salpingoophorectomy      s/p prolapsed bladder  . Bladder surgery      prolapsed  . Thoracotomy      granuloma, pulmonary-thoracotomy  . Breast surgery  07-21-07    mastectomy (right), all margins neg and LNs neg   . Appendectomy    . Tonsillectomy    . Rotator cuff repair    . Mastectomy      right  . Breast cyst excision      left  . Mouth surgery    . Eye surgery      cataract removal bilateral  . Melanoma excision      rt clavicle Dr Rhoderick Moody office)  . Lung surgery      Carlis Stable MS, RD, LDN [phone] Pager [phone] Weekend/After Hours Pager

## 2013-11-18 LAB — CBC
HEMATOCRIT: 31.8 % — AB (ref 36.0–46.0)
HEMOGLOBIN: 10.3 g/dL — AB (ref 12.0–15.0)
MCH: 28 pg (ref 26.0–34.0)
MCHC: 32.4 g/dL (ref 30.0–36.0)
MCV: 86.4 fL (ref 78.0–100.0)
Platelets: 155 10*3/uL (ref 150–400)
RBC: 3.68 MIL/uL — ABNORMAL LOW (ref 3.87–5.11)
RDW: 14.4 % (ref 11.5–15.5)
WBC: 8.2 10*3/uL (ref 4.0–10.5)

## 2013-11-18 LAB — COMPREHENSIVE METABOLIC PANEL
ALT: 66 U/L — ABNORMAL HIGH (ref 0–35)
ANION GAP: 9 (ref 5–15)
AST: 54 U/L — ABNORMAL HIGH (ref 0–37)
Albumin: 2.6 g/dL — ABNORMAL LOW (ref 3.5–5.2)
Alkaline Phosphatase: 60 U/L (ref 39–117)
BUN: 14 mg/dL (ref 6–23)
CALCIUM: 8.3 mg/dL — AB (ref 8.4–10.5)
CO2: 24 mEq/L (ref 19–32)
CREATININE: 0.79 mg/dL (ref 0.50–1.10)
Chloride: 104 mEq/L (ref 96–112)
GFR calc non Af Amer: 72 mL/min — ABNORMAL LOW (ref >90)
GFR, EST AFRICAN AMERICAN: 84 mL/min — AB (ref >90)
GLUCOSE: 94 mg/dL (ref 70–99)
Potassium: 4.2 mEq/L (ref 3.7–5.3)
Sodium: 137 mEq/L (ref 137–147)
Total Bilirubin: 0.2 mg/dL — ABNORMAL LOW (ref 0.3–1.2)
Total Protein: 5.3 g/dL — ABNORMAL LOW (ref 6.0–8.3)

## 2013-11-18 MED ORDER — CEPHALEXIN 500 MG PO CAPS: 500.0000 mg | ORAL_CAPSULE | Freq: Three times a day (TID) | ORAL | Status: AC

## 2013-11-18 NOTE — Progress Notes (Signed)
Physical Therapy Treatment Patient Details Name: [name] [name] MRN: [id number] DOB: 1924/01/10 Today's Date: 11/18/2013    History of Present Illness 80 yo female admitted with UTI. Hx of anxiety, COPD, osteoporosis, HTN, breast cancer, A fib. Pt is from [facility].     PT Comments    Progressing slowly with mobility. Continues to demonstrate general weakness, decreased activity tolerance. Do not feel pt will be able to safely and efficiently manage at home alone at this time. Discussed d/c plan-pt states she doesn't want to go to [facility] but that she is open to rehab.? Feel pt will need short rehab stay prior to returning to ILF alone if pt/family agreeable. Otherwise, pt will need to arrange for increased assistance/supervision if she chooses to d/c back to ILF.   Follow Up Recommendations  SNF;Supervision/Assistance - 24 hour     Equipment Recommendations  None recommended by PT    Recommendations for Other Services       Precautions / Restrictions Precautions Precautions: Fall Restrictions Weight Bearing Restrictions: No    Mobility  Bed Mobility Overal bed mobility: Modified Independent             General bed mobility comments: increased time.  Transfers Overall transfer level: Needs assistance Equipment used: 4-wheeled walker;Rolling walker (2 wheeled) Transfers: Sit to/from Stand Sit to Stand: Min guard         General transfer comment: close guard for safety. VCs safety, technique, hand placement.   Ambulation/Gait Ambulation/Gait assistance: Min guard Ambulation Distance (Feet): 85 Feet (x2) Assistive device: 4-wheeled walker Gait Pattern/deviations: Decreased stride length;Step-through pattern;Drifts right/left     General Gait Details: slow gait speed. fatigues easily. close guard for safety. seated rest break needed after ~85 feet. O2 sats 88-89% on RA at the lowest. VCs for pursed lip breathing-sat recovery to 93-94% on RA. Fatigues easily.     Stairs            Wheelchair Mobility    Modified Rankin (Stroke Patients Only)       Balance                                    Cognition Arousal/Alertness: Awake/alert Behavior During Therapy: WFL for tasks assessed/performed Overall Cognitive Status: Within Functional Limits for tasks assessed                      Exercises      General Comments        Pertinent Vitals/Pain Chest/abdomen-unrated. Pt states "dull, not sharp"    Home Living                      Prior Function            PT Goals (current goals can now be found in the care plan section) Progress towards PT goals: Progressing toward goals (slowly)    Frequency  Min 3X/week    PT Plan Current plan remains appropriate    Co-evaluation             End of Session Equipment Utilized During Treatment: Gait belt Activity Tolerance: Patient limited by fatigue Patient left: in chair;with call bell/phone within reach     Time: 9381-0175 PT Time Calculation (min): 28 min  Charges:  $Gait Training: 8-22 mins  G Codes:      [name], MPT Pager: [phone]

## 2013-11-18 NOTE — Progress Notes (Signed)
[facility] PROGRESS NOTE  Gale Klar CZY:[id number] DOB: [birth year] DOA: 11/16/2013 PCP: Chancy Hurter, MD  Assessment/Plan: Active Problems:   HYPERTENSION   COPD with emphysema Gold B   GERD   Recurrent UTI   UTI (lower urinary tract infection)    *Acute hepatitis/elevated lipase  CT scan negative , improving liver function, lipase Because of elevated liver function the patient had a right upper quadrant ultrasound that shows cholelithiasis but no evidence of acute cholecystitis  Continue to closely follow liver function and lipase, both of these are already improving  Patient could have had an episode of viral gastroenteritis   Hypertension  Discontinued Norvasc and losartan because of low blood pressure  Patient dehydrated and therefore we'll continue with IV fluids   Urinary tract infection  Continue Rocephin IV because patient allergic to multiple oral medications Blood culture ordered and pending   History of atrial fibrillation  Given low blood pressure Will monitor on telemetry, in sinus rhythm   COPD Continue Symbicort and Flonase  When necessary albuterol, oxygen saturation within normal limits, continue to monitor pulse ox  Code Status: full  Family Communication: family updated about patient's clinical progress  Disposition Plan: Possible discharge on Sunday to SNF   Brief narrative:  80 y.o. female presents to the ED with fevers. Patient states that she had a birthday celebration party and had been doing fine. She however noted increased fever and chills. Patient went to her PCP and was diagnosed with a UTI and started on macrodantin. Patient states that today her symptoms got much worse with feeling cold and having chills. Patient states in addition she had had vomiting and some abdominal pain. Patient states that she had no headaches no dizziness and no syncope. Patient states that she had no CP noted. She does in addition have severe COPD and is on  home oxygen presently. Patient states that she uses inhalers regularly.  Consultants:  None Procedures:  None Antibiotics:  Rocephin      HPI/Subjective: Complaining of shortness of breath last night but did not inform RN, wheezing a little bit today  Objective: Filed Vitals:   11/17/13 1454 11/17/13 2034 11/17/13 2122 11/18/13 0500  BP: 101/66  117/66 136/75  Pulse: 61  63 71  Temp: 97.8 F (36.6 C)  98.4 F (36.9 C) 98.6 F (37 C)  TempSrc: Oral  Oral Oral  Resp: 16  16 18   Height:      Weight:      SpO2: 94% 92% 97% 94%    Intake/Output Summary (Last 24 hours) at 11/18/13 1134 Last data filed at 11/17/13 1927  Gross per 24 hour  Intake    480 ml  Output      0 ml  Net    480 ml    Exam:  HENT:  Head: Atraumatic.  Nose: Nose normal.  Mouth/Throat: Oropharynx is clear and moist.  Eyes: Conjunctivae are normal. Pupils are equal, round, and reactive to light. No scleral icterus.  Neck: Neck supple. No tracheal deviation present.  Cardiovascular: Normal rate, regular rhythm, normal heart sounds and intact distal pulses.  Pulmonary/Chest: Effort normal and breath sounds normal. No respiratory distress.  Abdominal: Soft. Normal appearance and bowel sounds are normal. She exhibits no distension. There is no tenderness.  Musculoskeletal: She exhibits no edema and no tenderness.  Neurological: She is alert. No cranial nerve deficit.    Data Reviewed: Basic Metabolic Panel:  Recent Labs Lab 11/16/13 1826 11/17/13 0500 11/18/13 0505  NA 138 136* 137  K 4.1 3.8 4.2  CL 102 103 104  CO2 23 20 24   GLUCOSE 123* 105* 94  BUN 16 16 14   CREATININE 0.74 0.79 0.79  CALCIUM 8.8 8.5 8.3*    Liver Function Tests:  Recent Labs Lab 11/16/13 1826 11/17/13 0500 11/18/13 0505  AST 298* 153* 54*  ALT 146* 117* 66*  ALKPHOS 77 65 60  BILITOT 1.4* 0.6 0.2*  PROT 6.8 6.0 5.3*  ALBUMIN 3.5 3.0* 2.6*    Recent Labs Lab 11/16/13 1826 11/17/13 0815  LIPASE  158* 93*   No results found for this basename: AMMONIA,  in the last 168 hours  CBC:  Recent Labs Lab 11/16/13 1826 11/17/13 0500 11/18/13 0505  WBC 17.6* 17.4* 8.2  NEUTROABS 16.6*  --   --   HGB 12.3 11.4* 10.3*  HCT 36.4 34.5* 31.8*  MCV 84.5 85.4 86.4  PLT 169 164 155    Cardiac Enzymes: No results found for this basename: CKTOTAL, CKMB, CKMBINDEX, TROPONINI,  in the last 168 hours BNP (last 3 results)  Recent Labs  07/13/13 1454 07/24/13 2153  PROBNP 489.0* 226.8     CBG: No results found for this basename: GLUCAP,  in the last 168 hours  Recent Results (from the past 240 hour(s))  URINE CULTURE     Status: None   Collection Time    11/15/13 11:57 AM      Result Value Ref Range Status   Colony Count NO GROWTH   Final   Organism ID, Bacteria NO GROWTH   Final  CULTURE, BLOOD (ROUTINE X 2)     Status: None   Collection Time    11/17/13  8:15 AM      Result Value Ref Range Status   Specimen Description BLOOD LEFT HAND   Final   Special Requests BOTTLES DRAWN AEROBIC ONLY 5 CC   Final   Culture  Setup Time     Final   Value: 11/17/2013 11:56     Performed at [facility]   Culture     Final   Value:        BLOOD CULTURE RECEIVED NO GROWTH TO DATE CULTURE WILL BE HELD FOR 5 DAYS BEFORE ISSUING A FINAL NEGATIVE REPORT     Performed at [facility]   Report Status PENDING   Incomplete  CULTURE, BLOOD (ROUTINE X 2)     Status: None   Collection Time    11/17/13  8:20 AM      Result Value Ref Range Status   Specimen Description BLOOD LEFT HAND   Final   Special Requests BOTTLES DRAWN AEROBIC AND ANAEROBIC 6 CC EACH   Final   Culture  Setup Time     Final   Value: 11/17/2013 11:56     Performed at [facility]   Culture     Final   Value:        BLOOD CULTURE RECEIVED NO GROWTH TO DATE CULTURE WILL BE HELD FOR 5 DAYS BEFORE ISSUING A FINAL NEGATIVE REPORT     Performed at [facility]   Report Status PENDING   Incomplete      Studies: Dg Chest 2 View  11/16/2013   CLINICAL DATA:  Shortness of breath and fever  EXAM: CHEST  2 VIEW  COMPARISON:  Sep 28, 2013  FINDINGS: There is a degree of underlying emphysema. Scarring in the left upper and mid lung regions remain stable.  There is no edema or consolidation. Heart is mildly enlarged with pulmonary vascularity within normal limits, stable. There is mid thoracic dextroscoliosis. There is mild anterior wedging of a mid thoracic vertebral body. Bones are osteoporotic. There is atherosclerotic change in the aorta.  IMPRESSION: Scarring left lung. Underlying emphysema. No edema or consolidation. Heart is prominent but stable.   Electronically Signed   By: Lowella Grip M.D.   On: 11/16/2013 19:24   Ct Abdomen Pelvis W Contrast  11/16/2013   CLINICAL DATA:  Abdominal pain with nausea and vomiting.  Fever.  EXAM: CT ABDOMEN AND PELVIS WITH CONTRAST  TECHNIQUE: Multidetector CT imaging of the abdomen and pelvis was performed using the standard protocol following bolus administration of intravenous contrast.  CONTRAST:  112mL OMNIPAQUE IOHEXOL 300 MG/ML  SOLN  COMPARISON:  CT scan dated 07/24/2013  FINDINGS: The liver, spleen, pancreas, adrenal glands, and kidneys are normal except for a 15 mm cyst in the posterior aspect of the upper pole the right kidney on a 10 mm cyst in the posterior aspect of the mid right kidney. There is single small stone in the gallbladder. No dilated bile ducts.  There is extensive diverticulosis of the distal colon. Uterus and ovaries have been removed. No free air or free fluid. Bladder appears normal.  Extensive calcification in the abdominal aorta and iliac arteries. No acute osseous abnormality. Chronic changes at the lung bases are stable. Chronic slight cardiomegaly. Pectus excavatum deformity.  IMPRESSION: No acute abnormality of the abdomen or pelvis. Cholelithiasis. Diverticulosis.   Electronically Signed   By: Rozetta Nunnery M.D.   On: 11/16/2013 20:09    US Abdomen Limited Ruq  11/17/2013   CLINICAL DATA:  Abnormal liver function tests  EXAM: US ABDOMEN LIMITED - RIGHT UPPER QUADRANT  COMPARISON:  None.  FINDINGS: Gallbladder:  Within the gallbladder, there are several echogenic foci which move and shadow consistent with gallstones. Largest gallstone measures 7 mm in length. There is no gallbladder wall thickening or pericholecystic fluid. No sonographic Murphy sign noted.  Common bile duct:  Diameter: 5 mm. There is no intrahepatic or extrahepatic biliary duct dilatation.  Liver:  No focal lesion identified. Within normal limits in parenchymal echogenicity.  IMPRESSION: Cholelithiasis.  Study otherwise unremarkable.   Electronically Signed   By: Lowella Grip M.D.   On: 11/17/2013 10:05    Scheduled Meds: . antiseptic oral rinse  15 mL Mouth Rinse BID  . aspirin  325 mg Oral Daily  . budesonide-formoterol  2 puff Inhalation BID  . cefTRIAXone (ROCEPHIN)  IV  1 g Intravenous Q24H  . diazepam  2.5 mg Oral QHS  . fluticasone  1 spray Each Nare Daily  . folic acid  1 mg Oral Daily  . heparin  5,000 Units Subcutaneous 3 times per day  . multivitamin with minerals  1 tablet Oral Daily  . pantoprazole  40 mg Oral Daily  . polyethylene glycol  17 g Oral Daily  . senna  1 tablet Oral Daily  . thiamine  100 mg Oral Daily   Continuous Infusions:   Active Problems:   HYPERTENSION   COPD with emphysema Gold B   GERD   Recurrent UTI   UTI (lower urinary tract infection)    Time spent: 40 minutes   [facility] Pager [phone]. If 8PM-8AM, please contact night-coverage at [email or web address], password [facility] 11/18/2013, 11:34 AM  LOS: 2 days

## 2013-11-18 NOTE — Progress Notes (Signed)
Clinical Social Work  CSW met with patient and dtr at bedside in order to discuss DC plans. Dtr is patient's only local relative and dtr is going out of the country next [place]. Due to upcoming trip, dtr and patient agreeable to SNF placement. CSW provided bed offers and patient wants to return to [facility] since she has been there in the past and is familiar with facility. Dtr to complete paperwork with SNF at 2pm. CSW will leave weekend handoff in case patient is ready to DC over the weekend.  [place], [place] [phone]

## 2013-11-18 NOTE — Evaluation (Signed)
Occupational Therapy Evaluation Patient Details Name: Valena Ivanov MRN: [id number] DOB: 1924-01-28 Today's Date: 11/18/2013    History of Present Illness 80 yo female admitted with UTI. Hx of anxiety, COPD, osteoporosis, HTN, breast cancer, A fib. Pt is from [facility].    Clinical Impression   Pt fatigues easily with activity and O2 sats were 88-89% on RA with activity but increased to 93% with standing rest break and seated rest breaks. Educated on purse lip breathing techniques and taking rest breaks. Feel pt would not be safe to return to independent living apartment right now and could benefit from continued rehab at SNF level before return home. Pt is in agreement. Will continue to follow on acute.    Follow Up Recommendations  SNF;Supervision/Assistance - 24 hour    Equipment Recommendations  3 in 1 bedside comode    Recommendations for Other Services       Precautions / Restrictions Precautions Precautions: Fall Precaution Comments: monitor O2 sats Restrictions Weight Bearing Restrictions: No      Mobility                 Transfers Overall transfer level: Needs assistance Equipment used: Rolling walker (2 wheeled);4-wheeled walker Transfers: Sit to/from Stand Sit to Stand: Min guard         General transfer comment: close guard for safety. VCs safety, technique, hand placement.     Balance                                            ADL Overall ADL's : Needs assistance/impaired Eating/Feeding: Independent;Sitting   Grooming: Wash/dry hands;Standing;Min guard   Upper Body Bathing: Set up;Sitting;Supervision/ safety   Lower Body Bathing: Min guard;Sit to/from stand   Upper Body Dressing : Set up;Sitting   Lower Body Dressing: Min guard;Sit to/from stand   Toilet Transfer: Min guard;Ambulation;Comfort height toilet;Grab bars   Toileting- Clothing Manipulation and Hygiene: Min guard;Sit to/from stand       Functional  mobility during ADLs: Min guard;Rolling walker General ADL Comments: Pt managed own gown and mesh underwear/pad in the bathroom but fatigues easily. Sats at 88-89% on RA after toileting. Up to 93% with standing rest and PLB. Out in hall to ambulate with PT and back to chair. Pt stood at the window seat to retrieve brush from her cosmetic bag with min guard assist for balance. Encouraged PLB and pt needs mod cues to follow through with correct technique. Pt states she recenlty started using a shower seat even though she doesnt really like it at home due to getting more fatigued with showering. She states she usually pulls up with a towel rod and explained that is not th safest option--would recommend grab bar or toilet riser with handles for once pt is home.      Vision                     Perception     Praxis      Pertinent Vitals/Pain Pt states mild chest pressure after toileting but improved with rest break sats 88-89% on RA with activity. Up to 93% with rest break and PLB.     Hand Dominance     Extremity/Trunk Assessment Upper Extremity Assessment Upper Extremity Assessment: Generalized weakness           Communication Communication Communication: HOH   Cognition Arousal/Alertness:  Awake/alert Behavior During Therapy: WFL for tasks assessed/performed Overall Cognitive Status: Within Functional Limits for tasks assessed                     General Comments       Exercises       Shoulder Instructions      Home Living       Type of Home: Independent living facility Home Access: Level entry     Home Layout: One level     Bathroom Shower/Tub: Occupational psychologist: Standard     Home Equipment: Walker - 4 wheels;Grab bars - tub/shower;Shower seat          Prior Functioning/Environment Level of Independence: Independent with assistive device(s);Needs assistance    ADL's / Homemaking Assistance Needed: daughter helps sometimes  with laundry. Pt ambulateds to dining room for meals. She performs her own personal care including B/D and toileting. Pt states she recently started using a shower seat but doesnt like it.        OT Diagnosis: Generalized weakness   OT Problem List: Decreased strength;Decreased activity tolerance;Decreased knowledge of use of DME or AE   OT Treatment/Interventions: Self-care/ADL training;Patient/family education;Therapeutic activities;DME and/or AE instruction;Energy conservation    OT Goals(Current goals can be found in the care plan section) Acute Rehab OT Goals Patient Stated Goal: to return to PLOF OT Goal Formulation: With patient Time For Goal Achievement: 12/02/13 Potential to Achieve Goals: Good  OT Frequency: Min 2X/week   Barriers to D/C:            Co-evaluation              End of Session Equipment Utilized During Treatment: Rolling walker  Activity Tolerance: Patient limited by fatigue Patient left: in chair   Time: 3646-8032 OT Time Calculation (min): 23 min Charges:  OT General Charges $OT Visit: 1 Procedure OT Evaluation $Initial OT Evaluation Tier I: 1 Procedure OT Treatments $Self Care/Home Management : 8-22 mins G-Codes:    Jules Schick [phone] 11/18/2013, 10:22 AM

## 2013-11-19 LAB — COMPREHENSIVE METABOLIC PANEL
ALBUMIN: 3 g/dL — AB (ref 3.5–5.2)
ALK PHOS: 62 U/L (ref 39–117)
ALT: 47 U/L — ABNORMAL HIGH (ref 0–35)
AST: 26 U/L (ref 0–37)
Anion gap: 11 (ref 5–15)
BILIRUBIN TOTAL: 0.3 mg/dL (ref 0.3–1.2)
BUN: 8 mg/dL (ref 6–23)
CO2: 26 mEq/L (ref 19–32)
CREATININE: 0.71 mg/dL (ref 0.50–1.10)
Calcium: 8.7 mg/dL (ref 8.4–10.5)
Chloride: 104 mEq/L (ref 96–112)
GFR calc Af Amer: 87 mL/min — ABNORMAL LOW (ref >90)
GFR calc non Af Amer: 75 mL/min — ABNORMAL LOW (ref >90)
Glucose, Bld: 102 mg/dL — ABNORMAL HIGH (ref 70–99)
POTASSIUM: 4.4 meq/L (ref 3.7–5.3)
Sodium: 141 mEq/L (ref 137–147)
Total Protein: 5.9 g/dL — ABNORMAL LOW (ref 6.0–8.3)

## 2013-11-19 LAB — MAGNESIUM: MAGNESIUM: 2 mg/dL (ref 1.5–2.5)

## 2013-11-19 MED ORDER — AMLODIPINE BESYLATE 2.5 MG PO TABS
2.5000 mg | ORAL_TABLET | Freq: Every day | ORAL | Status: DC
  Administered 2013-11-19 – 2013-11-20 (×2): 2.5 mg via ORAL
  Filled 2013-11-19 (×2): qty 1

## 2013-11-19 MED ORDER — ALBUTEROL SULFATE (2.5 MG/3ML) 0.083% IN NEBU
2.5000 mg | INHALATION_SOLUTION | Freq: Three times a day (TID) | RESPIRATORY_TRACT | Status: DC
  Administered 2013-11-19 (×3): 2.5 mg via RESPIRATORY_TRACT
  Filled 2013-11-19 (×3): qty 3

## 2013-11-19 MED ORDER — SENNA 8.6 MG PO TABS
1.0000 | ORAL_TABLET | Freq: Once | ORAL | Status: AC
  Administered 2013-11-19: 8.6 mg via ORAL
  Filled 2013-11-19: qty 1

## 2013-11-19 MED ORDER — MAGNESIUM HYDROXIDE 400 MG/5ML PO SUSP
15.0000 mL | Freq: Every day | ORAL | Status: DC | PRN
  Administered 2013-11-19 – 2013-11-20 (×2): 15 mL via ORAL
  Filled 2013-11-19 (×2): qty 30

## 2013-11-19 NOTE — Progress Notes (Signed)
[facility] PROGRESS NOTE  [name] [name] IHK:[id number] DOB: [birth year] DOA: 11/16/2013 PCP: Chancy Hurter, MD  Assessment/Plan: Active Problems:   HYPERTENSION   COPD with emphysema Gold B   GERD   Recurrent UTI   UTI (lower urinary tract infection)   Acute hepatitis/elevated lipase  CT scan negative , improving liver function, lipase  Because of elevated liver function the patient had a right upper quadrant ultrasound that shows cholelithiasis but no evidence of acute cholecystitis  Continue to closely follow liver function and lipase, both of these are already improving  Patient could have had an episode of viral gastroenteritis  Hypertension  Resume Norvasc, continue to hold losartan  Patient dehydrated and therefore we'll continue with IV fluids  Urinary tract infection  Continue Rocephin IV because patient allergic to multiple oral medications  Blood culture ordered and pending  History of atrial fibrillation  Given low blood pressure Will monitor on telemetry, in sinus rhythm  COPD  Continue Symbicort and Flonase  When necessary albuterol, oxygen saturation within normal limits, continue to monitor pulse ox   Code Status: full  Family Communication: family updated about patient's clinical progress  Disposition Plan: Possible discharge on Sunday to SNF   Brief narrative:  80 y.o. female presents to the ED with fevers. Patient states that she had a birthday celebration party and had been doing fine. She however noted increased fever and chills. Patient went to her PCP and was diagnosed with a UTI and started on macrodantin. Patient states that today her symptoms got much worse with feeling cold and having chills. Patient states in addition she had had vomiting and some abdominal pain. Patient states that she had no headaches no dizziness and no syncope. Patient states that she had no CP noted. She does in addition have severe COPD and is on home oxygen presently. Patient  states that she uses inhalers regularly.  Consultants:  None Procedures:  None Antibiotics:  Rocephin  Once   HPI/Subjective:  Reading a book in no obvious complaints this morning  Objective: Filed Vitals:   11/18/13 2111 11/19/13 0601 11/19/13 0619 11/19/13 0846  BP: 121/68 159/64 147/65   Pulse: 61 65    Temp: 98.6 F (37 C) 98.4 F (36.9 C)    TempSrc: Oral Oral    Resp: 16 18    Height:      Weight:      SpO2: 98% 96%  95%    Intake/Output Summary (Last 24 hours) at 11/19/13 1148 Last data filed at 11/19/13 0832  Gross per 24 hour  Intake    480 ml  Output      0 ml  Net    480 ml    Exam:  HENT:  Head: Atraumatic.  Nose: Nose normal.  Mouth/Throat: Oropharynx is clear and moist.  Eyes: Conjunctivae are normal. Pupils are equal, round, and reactive to light. No scleral icterus.  Neck: Neck supple. No tracheal deviation present.  Cardiovascular: Normal rate, regular rhythm, normal heart sounds and intact distal pulses.  Pulmonary/Chest: Effort normal and breath sounds normal. No respiratory distress.  Abdominal: Soft. Normal appearance and bowel sounds are normal. She exhibits no distension. There is no tenderness.  Musculoskeletal: She exhibits no edema and no tenderness.  Neurological: She is alert. No cranial nerve deficit.    Data Reviewed: Basic Metabolic Panel:  Recent Labs Lab 11/16/13 1826 11/17/13 0500 11/18/13 0505 11/19/13 0530  NA 138 136* 137 141  K 4.1 3.8 4.2 4.4  CL 102 103 104 104  CO2 23 20 24 26   GLUCOSE 123* 105* 94 102*  BUN 16 16 14 8   CREATININE 0.74 0.79 0.79 0.71  CALCIUM 8.8 8.5 8.3* 8.7    Liver Function Tests:  Recent Labs Lab 11/16/13 1826 11/17/13 0500 11/18/13 0505 11/19/13 0530  AST 298* 153* 54* 26  ALT 146* 117* 66* 47*  ALKPHOS 77 65 60 62  BILITOT 1.4* 0.6 0.2* 0.3  PROT 6.8 6.0 5.3* 5.9*  ALBUMIN 3.5 3.0* 2.6* 3.0*    Recent Labs Lab 11/16/13 1826 11/17/13 0815  LIPASE 158* 93*   No  results found for this basename: AMMONIA,  in the last 168 hours  CBC:  Recent Labs Lab 11/16/13 1826 11/17/13 0500 11/18/13 0505  WBC 17.6* 17.4* 8.2  NEUTROABS 16.6*  --   --   HGB 12.3 11.4* 10.3*  HCT 36.4 34.5* 31.8*  MCV 84.5 85.4 86.4  PLT 169 164 155    Cardiac Enzymes: No results found for this basename: CKTOTAL, CKMB, CKMBINDEX, TROPONINI,  in the last 168 hours BNP (last 3 results)  Recent Labs  07/13/13 1454 07/24/13 2153  PROBNP 489.0* 226.8     CBG: No results found for this basename: GLUCAP,  in the last 168 hours  Recent Results (from the past 240 hour(s))  URINE CULTURE     Status: None   Collection Time    11/15/13 11:57 AM      Result Value Ref Range Status   Colony Count NO GROWTH   Final   Organism ID, Bacteria NO GROWTH   Final  CULTURE, BLOOD (ROUTINE X 2)     Status: None   Collection Time    11/17/13  8:15 AM      Result Value Ref Range Status   Specimen Description BLOOD LEFT HAND   Final   Special Requests BOTTLES DRAWN AEROBIC ONLY 5 CC   Final   Culture  Setup Time     Final   Value: 11/17/2013 11:56     Performed at [facility]   Culture     Final   Value:        BLOOD CULTURE RECEIVED NO GROWTH TO DATE CULTURE WILL BE HELD FOR 5 DAYS BEFORE ISSUING A FINAL NEGATIVE REPORT     Performed at [facility]   Report Status PENDING   Incomplete  CULTURE, BLOOD (ROUTINE X 2)     Status: None   Collection Time    11/17/13  8:20 AM      Result Value Ref Range Status   Specimen Description BLOOD LEFT HAND   Final   Special Requests BOTTLES DRAWN AEROBIC AND ANAEROBIC 6 CC EACH   Final   Culture  Setup Time     Final   Value: 11/17/2013 11:56     Performed at [facility]   Culture     Final   Value:        BLOOD CULTURE RECEIVED NO GROWTH TO DATE CULTURE WILL BE HELD FOR 5 DAYS BEFORE ISSUING A FINAL NEGATIVE REPORT     Performed at [facility]   Report Status PENDING   Incomplete     Studies: Dg  Chest 2 View  11/16/2013   CLINICAL DATA:  Shortness of breath and fever  EXAM: CHEST  2 VIEW  COMPARISON:  Sep 28, 2013  FINDINGS: There is a degree of underlying emphysema. Scarring in the left upper and mid  lung regions remain stable. There is no edema or consolidation. Heart is mildly enlarged with pulmonary vascularity within normal limits, stable. There is mid thoracic dextroscoliosis. There is mild anterior wedging of a mid thoracic vertebral body. Bones are osteoporotic. There is atherosclerotic change in the aorta.  IMPRESSION: Scarring left lung. Underlying emphysema. No edema or consolidation. Heart is prominent but stable.   Electronically Signed   By: Lowella Grip M.D.   On: 11/16/2013 19:24   Ct Abdomen Pelvis W Contrast  11/16/2013   CLINICAL DATA:  Abdominal pain with nausea and vomiting.  Fever.  EXAM: CT ABDOMEN AND PELVIS WITH CONTRAST  TECHNIQUE: Multidetector CT imaging of the abdomen and pelvis was performed using the standard protocol following bolus administration of intravenous contrast.  CONTRAST:  161mL OMNIPAQUE IOHEXOL 300 MG/ML  SOLN  COMPARISON:  CT scan dated 07/24/2013  FINDINGS: The liver, spleen, pancreas, adrenal glands, and kidneys are normal except for a 15 mm cyst in the posterior aspect of the upper pole the right kidney on a 10 mm cyst in the posterior aspect of the mid right kidney. There is single small stone in the gallbladder. No dilated bile ducts.  There is extensive diverticulosis of the distal colon. Uterus and ovaries have been removed. No free air or free fluid. Bladder appears normal.  Extensive calcification in the abdominal aorta and iliac arteries. No acute osseous abnormality. Chronic changes at the lung bases are stable. Chronic slight cardiomegaly. Pectus excavatum deformity.  IMPRESSION: No acute abnormality of the abdomen or pelvis. Cholelithiasis. Diverticulosis.   Electronically Signed   By: Rozetta Nunnery M.D.   On: 11/16/2013 20:09   US Abdomen  Limited Ruq  11/17/2013   CLINICAL DATA:  Abnormal liver function tests  EXAM: US ABDOMEN LIMITED - RIGHT UPPER QUADRANT  COMPARISON:  None.  FINDINGS: Gallbladder:  Within the gallbladder, there are several echogenic foci which move and shadow consistent with gallstones. Largest gallstone measures 7 mm in length. There is no gallbladder wall thickening or pericholecystic fluid. No sonographic Murphy sign noted.  Common bile duct:  Diameter: 5 mm. There is no intrahepatic or extrahepatic biliary duct dilatation.  Liver:  No focal lesion identified. Within normal limits in parenchymal echogenicity.  IMPRESSION: Cholelithiasis.  Study otherwise unremarkable.   Electronically Signed   By: Lowella Grip M.D.   On: 11/17/2013 10:05    Scheduled Meds: . albuterol  2.5 mg Nebulization TID  . antiseptic oral rinse  15 mL Mouth Rinse BID  . aspirin  325 mg Oral Daily  . budesonide-formoterol  2 puff Inhalation BID  . cefTRIAXone (ROCEPHIN)  IV  1 g Intravenous Q24H  . diazepam  2.5 mg Oral QHS  . fluticasone  1 spray Each Nare Daily  . folic acid  1 mg Oral Daily  . heparin  5,000 Units Subcutaneous 3 times per day  . multivitamin with minerals  1 tablet Oral Daily  . pantoprazole  40 mg Oral Daily  . polyethylene glycol  17 g Oral Daily  . senna  1 tablet Oral Daily  . thiamine  100 mg Oral Daily   Continuous Infusions:   Active Problems:   HYPERTENSION   COPD with emphysema Gold B   GERD   Recurrent UTI   UTI (lower urinary tract infection)    Time spent: 40 minutes   [facility] Pager [phone]. If 8PM-8AM, please contact night-coverage at [email or web address], password [facility] 11/19/2013, 11:48 AM  LOS: 3 days

## 2013-11-19 NOTE — Progress Notes (Addendum)
Patient had 13 beats of SVT on telemetry monitor. VSS, Pt resting, asymptomatic.  Fredirick Maudlin, NP notified.  New order for Serum Mag added.  Will continue to monitor. Wendee Beavers Dundarrach

## 2013-11-20 DIAGNOSIS — R0609 Other forms of dyspnea: Secondary | ICD-10-CM | POA: Diagnosis not present

## 2013-11-20 DIAGNOSIS — I10 Essential (primary) hypertension: Secondary | ICD-10-CM | POA: Diagnosis not present

## 2013-11-20 DIAGNOSIS — K8020 Calculus of gallbladder without cholecystitis without obstruction: Secondary | ICD-10-CM | POA: Diagnosis not present

## 2013-11-20 DIAGNOSIS — J438 Other emphysema: Secondary | ICD-10-CM | POA: Diagnosis not present

## 2013-11-20 DIAGNOSIS — J189 Pneumonia, unspecified organism: Secondary | ICD-10-CM | POA: Diagnosis not present

## 2013-11-20 DIAGNOSIS — F411 Generalized anxiety disorder: Secondary | ICD-10-CM | POA: Diagnosis not present

## 2013-11-20 DIAGNOSIS — M6281 Muscle weakness (generalized): Secondary | ICD-10-CM | POA: Diagnosis not present

## 2013-11-20 DIAGNOSIS — R279 Unspecified lack of coordination: Secondary | ICD-10-CM | POA: Diagnosis not present

## 2013-11-20 DIAGNOSIS — R0602 Shortness of breath: Secondary | ICD-10-CM | POA: Diagnosis not present

## 2013-11-20 DIAGNOSIS — J449 Chronic obstructive pulmonary disease, unspecified: Secondary | ICD-10-CM | POA: Diagnosis not present

## 2013-11-20 DIAGNOSIS — N390 Urinary tract infection, site not specified: Secondary | ICD-10-CM | POA: Diagnosis not present

## 2013-11-20 DIAGNOSIS — K219 Gastro-esophageal reflux disease without esophagitis: Secondary | ICD-10-CM | POA: Diagnosis not present

## 2013-11-20 MED ORDER — ALBUTEROL SULFATE (2.5 MG/3ML) 0.083% IN NEBU
2.5000 mg | INHALATION_SOLUTION | Freq: Two times a day (BID) | RESPIRATORY_TRACT | Status: DC
  Administered 2013-11-20: 2.5 mg via RESPIRATORY_TRACT
  Filled 2013-11-20: qty 3

## 2013-11-20 NOTE — Discharge Summary (Signed)
Physician Discharge Summary  [name] [name] MRN: [id number] DOB/AGE: 78/04/1924 78 y.o.  PCP: Chancy Hurter, MD   Admit date: 11/16/2013 Discharge date: 11/20/2013  Discharge Diagnoses:      HYPERTENSION   COPD with emphysema Gold B   GERD   Recurrent UTI   UTI (lower urinary tract infection)  Follow up recommendations #1 follow up with PCP in 5-7 days #2 follow LFTs in one week    Medication List    STOP taking these medications       acetaminophen 325 MG tablet  Commonly known as:  TYLENOL              TAKE these medications       albuterol (2.5 MG/3ML) 0.083% nebulizer solution  Commonly known as:  PROVENTIL  Take 3 mLs (2.5 mg total) by nebulization every 6 (six) hours as needed for wheezing or shortness of breath.     AMBULATORY NON FORMULARY MEDICATION  Oxygen at bedtime-2 or 2 1/2 liters     aspirin 325 MG tablet  Take 325 mg by mouth daily.     budesonide-formoterol 160-4.5 MCG/ACT inhaler  Commonly known as:  SYMBICORT  Inhale 2 puffs into the lungs 2 (two) times daily.     cephALEXin 500 MG capsule  Commonly known as:  KEFLEX  Take 1 capsule (500 mg total) by mouth 3 (three) times daily.     diazepam 5 MG tablet  Commonly known as:  VALIUM  Take 2.5 mg by mouth at bedtime. For anxiety     fluticasone 50 MCG/ACT nasal spray  Commonly known as:  FLONASE  Place 1 spray into both nostrils daily.     losartan 50 MG tablet  Commonly known as:  COZAAR  Take 50 mg by mouth daily.     nitrofurantoin 100 MG capsule  Commonly known as:  MACRODANTIN  Take 1 capsule (100 mg total) by mouth 2 (two) times daily.     nitroGLYCERIN 0.4 MG SL tablet  Commonly known as:  NITROSTAT  Place 0.4 mg under the tongue every 5 (five) minutes as needed for chest pain.     omeprazole 20 MG capsule  Commonly known as:  PRILOSEC  Take 20 mg by mouth daily.     polyethylene glycol packet  Commonly known as:  MIRALAX / GLYCOLAX  Take 17 g by mouth daily.  For constipation     senna 8.6 MG Tabs tablet  Commonly known as:  SENOKOT  Take 1 tablet by mouth daily. For constipation    amLODipine 2.5 MG tablet  Commonly known as:  NORVASC      Discharge Condition: *   Disposition: 03-Skilled Nursing Facility   Consults:    Significant Diagnostic Studies: Dg Chest 2 View  11/16/2013   CLINICAL DATA:  Shortness of breath and fever  EXAM: CHEST  2 VIEW  COMPARISON:  Sep 28, 2013  FINDINGS: There is a degree of underlying emphysema. Scarring in the left upper and mid lung regions remain stable. There is no edema or consolidation. Heart is mildly enlarged with pulmonary vascularity within normal limits, stable. There is mid thoracic dextroscoliosis. There is mild anterior wedging of a mid thoracic vertebral body. Bones are osteoporotic. There is atherosclerotic change in the aorta.  IMPRESSION: Scarring left lung. Underlying emphysema. No edema or consolidation. Heart is prominent but stable.   Electronically Signed   By: Lowella Grip M.D.   On: 11/16/2013 19:24   Ct Abdomen  Pelvis W Contrast  11/16/2013   CLINICAL DATA:  Abdominal pain with nausea and vomiting.  Fever.  EXAM: CT ABDOMEN AND PELVIS WITH CONTRAST  TECHNIQUE: Multidetector CT imaging of the abdomen and pelvis was performed using the standard protocol following bolus administration of intravenous contrast.  CONTRAST:  141mL OMNIPAQUE IOHEXOL 300 MG/ML  SOLN  COMPARISON:  CT scan dated 07/24/2013  FINDINGS: The liver, spleen, pancreas, adrenal glands, and kidneys are normal except for a 15 mm cyst in the posterior aspect of the upper pole the right kidney on a 10 mm cyst in the posterior aspect of the mid right kidney. There is single small stone in the gallbladder. No dilated bile ducts.  There is extensive diverticulosis of the distal colon. Uterus and ovaries have been removed. No free air or free fluid. Bladder appears normal.  Extensive calcification in the abdominal aorta and iliac  arteries. No acute osseous abnormality. Chronic changes at the lung bases are stable. Chronic slight cardiomegaly. Pectus excavatum deformity.  IMPRESSION: No acute abnormality of the abdomen or pelvis. Cholelithiasis. Diverticulosis.   Electronically Signed   By: Rozetta Nunnery M.D.   On: 11/16/2013 20:09   US Abdomen Limited Ruq  11/17/2013   CLINICAL DATA:  Abnormal liver function tests  EXAM: US ABDOMEN LIMITED - RIGHT UPPER QUADRANT  COMPARISON:  None.  FINDINGS: Gallbladder:  Within the gallbladder, there are several echogenic foci which move and shadow consistent with gallstones. Largest gallstone measures 7 mm in length. There is no gallbladder wall thickening or pericholecystic fluid. No sonographic Murphy sign noted.  Common bile duct:  Diameter: 5 mm. There is no intrahepatic or extrahepatic biliary duct dilatation.  Liver:  No focal lesion identified. Within normal limits in parenchymal echogenicity.  IMPRESSION: Cholelithiasis.  Study otherwise unremarkable.   Electronically Signed   By: Lowella Grip M.D.   On: 11/17/2013 10:05      Microbiology: Recent Results (from the past 240 hour(s))  URINE CULTURE     Status: None   Collection Time    11/15/13 11:57 AM      Result Value Ref Range Status   Colony Count NO GROWTH   Final   Organism ID, Bacteria NO GROWTH   Final  CULTURE, BLOOD (ROUTINE X 2)     Status: None   Collection Time    11/17/13  8:15 AM      Result Value Ref Range Status   Specimen Description BLOOD LEFT HAND   Final   Special Requests BOTTLES DRAWN AEROBIC ONLY 5 CC   Final   Culture  Setup Time     Final   Value: 11/17/2013 11:56     Performed at [facility]   Culture     Final   Value:        BLOOD CULTURE RECEIVED NO GROWTH TO DATE CULTURE WILL BE HELD FOR 5 DAYS BEFORE ISSUING A FINAL NEGATIVE REPORT     Performed at [facility]   Report Status PENDING   Incomplete  CULTURE, BLOOD (ROUTINE X 2)     Status: None   Collection Time     11/17/13  8:20 AM      Result Value Ref Range Status   Specimen Description BLOOD LEFT HAND   Final   Special Requests BOTTLES DRAWN AEROBIC AND ANAEROBIC 6 CC EACH   Final   Culture  Setup Time     Final   Value: 11/17/2013 11:56  Performed at [facility]     Final   Value:        BLOOD CULTURE RECEIVED NO GROWTH TO DATE CULTURE WILL BE HELD FOR 5 DAYS BEFORE ISSUING A FINAL NEGATIVE REPORT     Performed at [facility]   Report Status PENDING   Incomplete     Labs: Results for orders placed during the hospital encounter of 11/16/13 (from the past 48 hour(s))  COMPREHENSIVE METABOLIC PANEL     Status: Abnormal   Collection Time    11/19/13  5:30 AM      Result Value Ref Range   Sodium 141  137 - 147 mEq/L   Potassium 4.4  3.7 - 5.3 mEq/L   Chloride 104  96 - 112 mEq/L   CO2 26  19 - 32 mEq/L   Glucose, Bld 102 (*) 70 - 99 mg/dL   BUN 8  6 - 23 mg/dL   Creatinine, Ser 0.71  0.50 - 1.10 mg/dL   Calcium 8.7  8.4 - 10.5 mg/dL   Total Protein 5.9 (*) 6.0 - 8.3 g/dL   Albumin 3.0 (*) 3.5 - 5.2 g/dL   AST 26  0 - 37 U/L   ALT 47 (*) 0 - 35 U/L   Alkaline Phosphatase 62  39 - 117 U/L   Total Bilirubin 0.3  0.3 - 1.2 mg/dL   GFR calc non Af Amer 75 (*) >90 mL/min   GFR calc Af Amer 87 (*) >90 mL/min   Comment: (NOTE)     The eGFR has been calculated using the CKD EPI equation.     This calculation has not been validated in all clinical situations.     eGFR's persistently <90 mL/min signify possible Chronic Kidney     Disease.   Anion gap 11  5 - 15  MAGNESIUM     Status: None   Collection Time    11/19/13  9:53 PM      Result Value Ref Range   Magnesium 2.0  1.5 - 2.5 mg/dL     [name] [name]  80 y.o. female presents to the ED with fevers. Patient states that she had a birthday celebration party and had been doing fine. She however noted increased fever and chills. Patient went to her PCP and was diagnosed with a UTI and started on macrodantin. Patient  states that today her symptoms got much worse with feeling cold and having chills. Patient states in addition she had had vomiting and some abdominal pain. Patient states that she had no headaches no dizziness and no syncope. Patient states that she had no CP noted. She does in addition have severe COPD and is on home oxygen presently.   HOSPITAL COURSE: Acute hepatitis/elevated lipase  Abnormal liver function upon admission, CT scan of the abdomen and pelvis showed no acute abnormality, cholelithiasis, diverticulosis Initial AST was 298, ALT was 146, bilirubin 1.4, lipase 158, these parameters improved tremendously during her stay Because of elevated liver function the patient had a right upper quadrant ultrasound that shows cholelithiasis but no evidence of acute cholecystitis  Continue to closely follow liver function and lipase, both of these are already improving  Patient could have had an episode of viral gastroenteritis  CMP in 1 week  Hypertension  Antihypertensive medications held initially because of low blood pressure Resuming all meds at the time of discharge   Urinary tract infection  Treated with IV Rocephin because of allergy to multiple  oral medications, now switched to Keflex  Blood culture ordered and no growth so far   History of atrial fibrillation  Given low blood pressure Will monitor on telemetry, in sinus rhythm   COPD  Continue Symbicort and Flonase  When necessary albuterol, oxygen saturation within normal limits, Continue home oxygen, nebulizer     Discharge Exam:  Blood pressure 154/73, pulse 68, temperature 97.8 F (36.6 C), temperature source Oral, resp. rate 20, height $RemoveBe'5\' 2"'dEIyvzqly$  (1.575 m), weight 54.658 kg (120 lb 8 oz), SpO2 95.00%. Cardiovascular: Normal rate, regular rhythm, normal heart sounds and intact distal pulses.  Pulmonary/Chest: Effort normal and breath sounds normal. No respiratory distress.  Abdominal: Soft. Normal appearance and bowel  sounds are normal. She exhibits no distension. There is no tenderness.  Musculoskeletal: She exhibits no edema and no tenderness.  Neurological: She is alert. No cranial nerve deficit.           Discharge Instructions   Diet - low sodium heart healthy    Complete by:  As directed      Increase activity slowly    Complete by:  As directed            Follow-up Information   Follow up with Chancy Hurter, MD. Schedule an appointment as soon as possible for a visit in 1 week.   Specialties:  Internal Medicine, Radiology   Contact information:   [place] [address] [phone]       Signed: Reyne Dumas 11/20/2013, 12:06 PM

## 2013-11-20 NOTE — Progress Notes (Addendum)
Patient is set to discharge to [facility] faxed d/c summary over & confirmed with [facility] @ SNF that they can take patient today. Discharge packet given to RN, Elmyra Ricks. Daughter, [name] aware & is on her way to pick patient up to transport to SNF.   Clinical Social Work Department CLINICAL SOCIAL WORK PLACEMENT NOTE 11/20/2013  Patient:  [name] [name], [name] [name]  Account Number:  [id number] Admit date:  11/16/2013  Clinical Social Worker:  Sindy Messing, LCSW  Date/time:  11/17/2013 03:00 PM  Clinical Social Work is seeking post-discharge placement for this patient at the following level of care:   [facility]   (*CSW will update this form in Epic as items are completed)   11/17/2013  Patient/family provided with [place] Department of Clinical Social Work's list of facilities offering this level of care within the geographic area requested by the patient (or if unable, by the patient's family).  11/17/2013  Patient/family informed of their freedom to choose among providers that offer the needed level of care, that participate in Medicare, Medicaid or managed care program needed by the patient, have an available bed and are willing to accept the patient.  11/17/2013  Patient/family informed of [facility]' ownership interest in [facility], as well as of the fact that they are under no obligation to receive care at this facility.  PASARR submitted to EDS on  PASARR number received on   FL2 transmitted to all facilities in geographic area requested by pt/family on  11/17/2013 FL2 transmitted to all facilities within larger geographic area on   Patient informed that his/her managed care company has contracts with or will negotiate with  certain facilities, including the following:     Patient/family informed of bed offers received:  11/20/2013 Patient chooses bed at [place] Physician recommends and patient chooses bed at    Patient to be transferred to [place]  on  11/20/2013 Patient to be transferred to facility by patient's daughter, Catherine's car Patient and family notified of transfer on 11/20/2013 Name of family member notified:  patient's daughter, [name]  The following physician request were entered in Epic:   Additional Comments:   [name], [place] Social Worker cell #: [phone]

## 2013-11-21 ENCOUNTER — Non-Acute Institutional Stay (SKILLED_NURSING_FACILITY): Payer: Medicare Other | Admitting: Adult Health

## 2013-11-21 ENCOUNTER — Encounter: Payer: Self-pay | Admitting: Adult Health

## 2013-11-21 DIAGNOSIS — F419 Anxiety disorder, unspecified: Secondary | ICD-10-CM

## 2013-11-21 DIAGNOSIS — J438 Other emphysema: Secondary | ICD-10-CM

## 2013-11-21 DIAGNOSIS — G4700 Insomnia, unspecified: Secondary | ICD-10-CM | POA: Insufficient documentation

## 2013-11-21 DIAGNOSIS — K219 Gastro-esophageal reflux disease without esophagitis: Secondary | ICD-10-CM

## 2013-11-21 DIAGNOSIS — I10 Essential (primary) hypertension: Secondary | ICD-10-CM | POA: Diagnosis not present

## 2013-11-21 DIAGNOSIS — N390 Urinary tract infection, site not specified: Secondary | ICD-10-CM | POA: Diagnosis not present

## 2013-11-21 DIAGNOSIS — F411 Generalized anxiety disorder: Secondary | ICD-10-CM

## 2013-11-21 DIAGNOSIS — J439 Emphysema, unspecified: Secondary | ICD-10-CM

## 2013-11-21 NOTE — Progress Notes (Signed)
Patient ID: [name] [name], female   DOB: [birth year], 80 y.o.   MRN: [id number]               PROGRESS NOTE  DATE: 11/21/2013  FACILITY: Nursing Home Location: [facility]  LEVEL OF CARE: SNF (31)  Acute Visit  CHIEF COMPLAINT:  Follow-up Hospitalization  HISTORY OF PRESENT ILLNESS: This is an 80 year old female who has been admitted to [facility] on 11/19/13 from [facility] with Recurrent UTI. She has been admitted for a short-term rehabilitation.   REASSESSMENT OF ONGOING PROBLEM(S):  HTN: Pt 's HTN remains stable.  Denies CP, sob, DOE, pedal edema, headaches, dizziness or visual disturbances.  No complications from the medications currently being used.  Last BP : 118/68  COPD: the COPD remains stable.  Pt denies sob, cough, wheezing or declining exercise tolerance.  No complications from the medications presently being used.  ANXIETY: The anxiety remains stable. Patient denies ongoing anxiety or irritability. No complications reported from the medications currently being used.   PAST MEDICAL HISTORY : Reviewed.  No changes/see problem list  CURRENT MEDICATIONS: Reviewed per MAR/see medication list  REVIEW OF SYSTEMS:  GENERAL: no change in appetite, no fatigue, no weight changes, no fever, chills or weakness RESPIRATORY: no cough, SOB, DOE, wheezing, hemoptysis CARDIAC: no chest pain, edema or palpitations GI: no abdominal pain, diarrhea, constipation, heart burn, nausea or vomiting  PHYSICAL EXAMINATION GENERAL: no acute distress, normal body habitus EYES: conjunctivae normal, sclerae normal, normal eye lids NECK: supple, trachea midline, no neck masses, no thyroid tenderness, no thyromegaly LYMPHATICS: no LAN in the neck, no supraclavicular LAN RESPIRATORY: breathing is even & unlabored, BS CTAB CARDIAC: RRR, no murmur,no extra heart sounds, no edema GI: abdomen soft, normal BS, no masses, no tenderness, no hepatomegaly, no  splenomegaly EXTREMITIES:  Able to move all 4 extremities PSYCHIATRIC: the patient is alert & oriented to person, affect & behavior appropriate  LABS/RADIOLOGY: Labs reviewed: Basic Metabolic Panel:  Recent Labs  03/30/13 1338  11/17/13 0500 11/18/13 0505 11/19/13 0530 11/19/13 2153  NA 138  < > 136* 137 141  --   K 4.3  < > 3.8 4.2 4.4  --   CL 103  < > 103 104 104  --   CO2 26  < > 20 24 26   --   GLUCOSE 103*  < > 105* 94 102*  --   BUN 6  < > 16 14 8   --   CREATININE 0.76  < > 0.79 0.79 0.71  --   CALCIUM 9.4  < > 8.5 8.3* 8.7  --   MG 1.9  --   --   --   --  2.0  PHOS 3.1  --   --   --   --   --   < > = values in this interval not displayed. Liver Function Tests:  Recent Labs  11/17/13 0500 11/18/13 0505 11/19/13 0530  AST 153* 54* 26  ALT 117* 66* 47*  ALKPHOS 65 60 62  BILITOT 0.6 0.2* 0.3  PROT 6.0 5.3* 5.9*  ALBUMIN 3.0* 2.6* 3.0*    Recent Labs  11/16/13 1826 11/17/13 0815  LIPASE 158* 93*   No results found for this basename: AMMONIA,  in the last 8760 hours CBC:  Recent Labs  07/25/13 0355  09/28/13 1540 11/16/13 1826 11/17/13 0500 11/18/13 0505  WBC 16.6*  < > 7.8 17.6* 17.4* 8.2  NEUTROABS 14.5*  --  4.7 16.6*  --   --   HGB 11.3*  < > 12.4 12.3 11.4* 10.3*  HCT 34.7*  < > 37.3 36.4 34.5* 31.8*  MCV 88.1  < > 88.1 84.5 85.4 86.4  PLT 242  < > 242.0 169 164 155  < > = values in this interval not displayed.  Lipid Panel:  Recent Labs  07/27/13 0405  HDL 25*   Cardiac Enzymes:  Recent Labs  07/24/13 2153  TROPONINI <0.30   CBG:  Recent Labs  07/17/13 0728 07/18/13 0752  GLUCAP 109* 97    ASSESSMENT/PLAN:  UTI - continue Keflex; discontinue Macrobid Hypertension - well controlled; continue losartan and Norvasc COPD - stable; continue Symbicort and Flonase Anxiety - stable; continue volume at bedtime GERD - stable; continue Prilosec     CPT CODE: 29924  Moishy Laday Vargas - NP [facility] [phone]

## 2013-11-22 ENCOUNTER — Non-Acute Institutional Stay (SKILLED_NURSING_FACILITY): Payer: Medicare Other | Admitting: Internal Medicine

## 2013-11-22 DIAGNOSIS — R0609 Other forms of dyspnea: Secondary | ICD-10-CM

## 2013-11-22 DIAGNOSIS — N390 Urinary tract infection, site not specified: Secondary | ICD-10-CM

## 2013-11-22 DIAGNOSIS — I10 Essential (primary) hypertension: Secondary | ICD-10-CM

## 2013-11-22 DIAGNOSIS — R0989 Other specified symptoms and signs involving the circulatory and respiratory systems: Secondary | ICD-10-CM | POA: Diagnosis not present

## 2013-11-22 DIAGNOSIS — J438 Other emphysema: Secondary | ICD-10-CM

## 2013-11-22 DIAGNOSIS — J439 Emphysema, unspecified: Secondary | ICD-10-CM

## 2013-11-22 DIAGNOSIS — R0600 Dyspnea, unspecified: Secondary | ICD-10-CM

## 2013-11-22 NOTE — Progress Notes (Signed)
HISTORY & PHYSICAL  DATE: 11/22/2013   FACILITY: [facility]  LEVEL OF CARE: SNF (31)  ALLERGIES:  Allergies  Allergen Reactions  . Azithromycin Hives, Diarrhea, Dermatitis and Rash  . Ciprofloxacin Swelling  . Levaquin [Levofloxacin] Hives  . Penicillins Hives    has had mild doses that she did not have reactions to  . Prednisone     Unknown   . Sulfamethoxazole Nausea And Vomiting    CHIEF COMPLAINT:  Manage UTI, COPD and hypertension  HISTORY OF PRESENT ILLNESS: 80 year old Caucasian female was hospitalized secondary to fever and chills. After hospitalization she is admitted to this facility for short-term rehabilitation.  UTI: The UTI remains stable.  The patient denies ongoing suprapubic pain, flank pain, dysuria, urinary frequency, urinary hesitancy or hematuria.  No complications reported from the current antibiotic being used.  COPD: the COPD remains stable.  Pt denies sob, cough, wheezing or declining exercise tolerance.  No complications from the medications presently being used.  HTN: Pt 's HTN remains stable.  Denies CP, sob, DOE, pedal edema, headaches, dizziness or visual disturbances.  No complications from the medications currently being used.  Last BP : 128/70.  PAST MEDICAL HISTORY :  Past Medical History  Diagnosis Date  . ANXIETY 08/16/2008  . CAD, UNSPECIFIED SITE 10/17/2008  . CHOLELITHIASIS 08/23/2008  . COPD 01/04/2010    FeV1 101%, DLCO64% 2008  . 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 DIVERTICULOSIS-COLON 04/07/2008  . GERD 04/19/2007  . HYPERSOMNIA, ASSOCIATED WITH SLEEP APNEA 02/01/2007  . HYPERTENSION 11/12/2006  . MITRAL VALVE PROLAPSE, HX OF 10/07/2008  . NEOPLASM, MALIGNANT, BREAST, RIGHT 02/15/2008  . OSTEOPOROSIS 04/04/2008  . Raynaud's syndrome 04/25/2009  . Airway obstruction   . History of diverticulitis of colon   . Tortuous colon   . Anal stricture   . Chronic constipation   . Fatty liver   .  Hiatal hernia   . Esophageal stricture   . IBS (irritable bowel syndrome)   . Pulmonary fibrosis   . Sleep apnea   . COPD (chronic obstructive pulmonary disease)   . Colitis     sigmoid  . Candida esophagitis   . Diverticulitis   . Hearing aid worn     bilateral  . Atrial fibrillation     she denies known hx of afib 02/24/13  . Complication of anesthesia     difficult intubation    PAST SURGICAL HISTORY: Past Surgical History  Procedure Laterality Date  . Abdominal hysterectomy    . Bilateral salpingoophorectomy      s/p prolapsed bladder  . Bladder surgery      prolapsed  . Thoracotomy      granuloma, pulmonary-thoracotomy  . Breast surgery  07-21-07    mastectomy (right), all margins neg and LNs neg   . Appendectomy    . Tonsillectomy    . Rotator cuff repair    . Mastectomy      right  . Breast cyst excision      left  . Mouth surgery    . Eye surgery      cataract removal bilateral  . Melanoma excision      rt clavicle Dr Rhoderick Moody office)  . Lung surgery      SOCIAL HISTORY:  reports that she quit smoking about 30 years ago. Her smoking use included Cigarettes. She has a 60 pack-year smoking history. She has never used smokeless tobacco. She reports that  she drinks alcohol. She reports that she does not use illicit drugs.  FAMILY HISTORY:  Family History  Problem Relation Age of Onset  . Parkinsonism Father   . Coronary artery disease Father   . Breast cancer      aunt  . Colon cancer      aunt, age 53  . Uterine cancer      aunt  . Stroke Mother   . Heart failure Mother   . Cancer Maternal Aunt     breast    CURRENT MEDICATIONS: Reviewed per MAR/see medication list  REVIEW OF SYSTEMS: Respiratory -- complains of ongoing shortness of breath and chest pain across,  See HPI otherwise 14 point ROS is negative.  PHYSICAL EXAMINATION  VS:  See VS section  GENERAL: no acute distress, normal body habitus EYES: conjunctivae normal, sclerae normal,  normal eye lids MOUTH/THROAT: lips without lesions,no lesions in the mouth,tongue is without lesions,uvula elevates in midline NECK: supple, trachea midline, no neck masses, no thyroid tenderness, no thyromegaly LYMPHATICS: no LAN in the neck, no supraclavicular LAN RESPIRATORY: breathing is even & unlabored, BS CTAB CARDIAC: RRR, no murmur,no extra heart sounds, no edema GI:  ABDOMEN: abdomen soft, normal BS, no masses, no tenderness  LIVER/SPLEEN: no hepatomegaly, no splenomegaly MUSCULOSKELETAL: HEAD: normal to inspection  EXTREMITIES: LEFT UPPER EXTREMITY: full range of motion, normal strength & tone RIGHT UPPER EXTREMITY:  full range of motion, normal strength & tone LEFT LOWER EXTREMITY:  full range of motion, normal strength & tone RIGHT LOWER EXTREMITY:  full range of motion, normal strength & tone PSYCHIATRIC: the patient is alert & oriented to person, affect & behavior appropriate  LABS/RADIOLOGY:  Labs reviewed: Basic Metabolic Panel:  Recent Labs  03/30/13 1338  11/17/13 0500 11/18/13 0505 11/19/13 0530 11/19/13 2153  NA 138  < > 136* 137 141  --   K 4.3  < > 3.8 4.2 4.4  --   CL 103  < > 103 104 104  --   CO2 26  < > 20 24 26   --   GLUCOSE 103*  < > 105* 94 102*  --   BUN 6  < > 16 14 8   --   CREATININE 0.76  < > 0.79 0.79 0.71  --   CALCIUM 9.4  < > 8.5 8.3* 8.7  --   MG 1.9  --   --   --   --  2.0  PHOS 3.1  --   --   --   --   --   < > = values in this interval not displayed. Liver Function Tests:  Recent Labs  11/17/13 0500 11/18/13 0505 11/19/13 0530  AST 153* 54* 26  ALT 117* 66* 47*  ALKPHOS 65 60 62  BILITOT 0.6 0.2* 0.3  PROT 6.0 5.3* 5.9*  ALBUMIN 3.0* 2.6* 3.0*    Recent Labs  11/16/13 1826 11/17/13 0815  LIPASE 158* 93*   CBC:  Recent Labs  07/25/13 0355  09/28/13 1540 11/16/13 1826 11/17/13 0500 11/18/13 0505  WBC 16.6*  < > 7.8 17.6* 17.4* 8.2  NEUTROABS 14.5*  --  4.7 16.6*  --   --   HGB 11.3*  < > 12.4 12.3 11.4*  10.3*  HCT 34.7*  < > 37.3 36.4 34.5* 31.8*  MCV 88.1  < > 88.1 84.5 85.4 86.4  PLT 242  < > 242.0 169 164 155  < > = values in this interval not displayed.  Lipid Panel:  Recent Labs  07/27/13 0405  HDL 25*   Cardiac Enzymes:  Recent Labs  07/24/13 2153  TROPONINI <0.30   CBG:  Recent Labs  07/17/13 0728 07/18/13 0752  GLUCAP 109* 97   CHEST  2 VIEW   COMPARISON:  Sep 28, 2013   FINDINGS: There is a degree of underlying emphysema. Scarring in the left upper and mid lung regions remain stable. There is no edema or consolidation. Heart is mildly enlarged with pulmonary vascularity within normal limits, stable. There is mid thoracic dextroscoliosis. There is mild anterior wedging of a mid thoracic vertebral body. Bones are osteoporotic. There is atherosclerotic change in the aorta.   IMPRESSION: Scarring left lung. Underlying emphysema. No edema or consolidation. Heart is prominent but stable. CT ABDOMEN AND PELVIS WITH CONTRAST   TECHNIQUE: Multidetector CT imaging of the abdomen and pelvis was performed using the standard protocol following bolus administration of intravenous contrast.   CONTRAST:  135mL OMNIPAQUE IOHEXOL 300 MG/ML  SOLN   COMPARISON:  CT scan dated 07/24/2013   FINDINGS: The liver, spleen, pancreas, adrenal glands, and kidneys are normal except for a 15 mm cyst in the posterior aspect of the upper pole the right kidney on a 10 mm cyst in the posterior aspect of the mid right kidney. There is single small stone in the gallbladder. No dilated bile ducts.   There is extensive diverticulosis of the distal colon. Uterus and ovaries have been removed. No free air or free fluid. Bladder appears normal.   Extensive calcification in the abdominal aorta and iliac arteries. No acute osseous abnormality. Chronic changes at the lung bases are stable. Chronic slight cardiomegaly. Pectus excavatum deformity.   IMPRESSION: No acute abnormality  of the abdomen or pelvis. Cholelithiasis. Diverticulosis. US ABDOMEN LIMITED - RIGHT UPPER QUADRANT   COMPARISON:  None.   FINDINGS: Gallbladder:   Within the gallbladder, there are several echogenic foci which move and shadow consistent with gallstones. Largest gallstone measures 7 mm in length. There is no gallbladder wall thickening or pericholecystic fluid. No sonographic Murphy sign noted.   Common bile duct:   Diameter: 5 mm. There is no intrahepatic or extrahepatic biliary duct dilatation.   Liver:   No focal lesion identified. Within normal limits in parenchymal echogenicity.   IMPRESSION: Cholelithiasis.  Study otherwise unremarkable.    ASSESSMENT/PLAN:  UTI-continue Keflex Hypertension-well-controlled COPD-compensated Dyspnea-check chest x-ray GERD-continue Prilosec Constipation-well-controlled  I have reviewed patient's medical records received at admission/from hospitalization.  CPT CODE: 38101  Gayani Y Dasanayaka, [place] [phone]

## 2013-11-23 ENCOUNTER — Encounter: Payer: Self-pay | Admitting: *Deleted

## 2013-11-23 ENCOUNTER — Telehealth: Payer: Self-pay | Admitting: Critical Care Medicine

## 2013-11-23 LAB — CULTURE, BLOOD (ROUTINE X 2)
CULTURE: NO GROWTH
CULTURE: NO GROWTH

## 2013-11-23 NOTE — Telephone Encounter (Signed)
Let her know i am going to be out as well hospitalists can admit her and will care for her and one of my partners can see her in consultation Suggest taking pt to ED if emergent. Alternative is to work her in with tammy or another provider today and ??direct admit to hospital from office

## 2013-11-23 NOTE — Telephone Encounter (Signed)
Called spoke with daughter. Aware of recs. She will call [facility] and will call me back right aware

## 2013-11-23 NOTE — Telephone Encounter (Signed)
noted 

## 2013-11-23 NOTE — Telephone Encounter (Signed)
Spoke w/ [name]. She reports pt is going to go to [facility]. She would like for Dr. Joya Gaskins to call and at least let them know she is coming and about her case. I advised will make PW aware. Thanks

## 2013-11-23 NOTE — Telephone Encounter (Signed)
Called spoke with catherine. Reports pt was hospitalized last week for UTI and in [facility] x 5 days. Pt then went to [facility]. [name] is leaving today to go to [place] x 1 week w/ her daughter. Pt son will be coming from [place] tomorrow. Pt has been c/o ache in chest, congestion. Did CXR last night and was started on doxycyline. She wants to know if PW will admit pt to the hospital. Please advise thanks

## 2013-11-23 NOTE — Telephone Encounter (Signed)
Pt daughter calling bk wanted to say thank you and that they decided to keep mother @ the assisted living for right now and if she go any worse then they would call ems to transport to hosp.[name]

## 2013-11-29 ENCOUNTER — Non-Acute Institutional Stay (SKILLED_NURSING_FACILITY): Payer: Medicare Other | Admitting: Adult Health

## 2013-11-29 ENCOUNTER — Encounter: Payer: Self-pay | Admitting: Adult Health

## 2013-11-29 DIAGNOSIS — K219 Gastro-esophageal reflux disease without esophagitis: Secondary | ICD-10-CM

## 2013-11-29 DIAGNOSIS — J439 Emphysema, unspecified: Secondary | ICD-10-CM

## 2013-11-29 DIAGNOSIS — F411 Generalized anxiety disorder: Secondary | ICD-10-CM | POA: Diagnosis not present

## 2013-11-29 DIAGNOSIS — J438 Other emphysema: Secondary | ICD-10-CM

## 2013-11-29 DIAGNOSIS — I10 Essential (primary) hypertension: Secondary | ICD-10-CM

## 2013-11-29 DIAGNOSIS — F419 Anxiety disorder, unspecified: Secondary | ICD-10-CM

## 2013-11-29 NOTE — Progress Notes (Signed)
Patient ID: [name] [name], female   DOB: [birth year], 80 y.o.   MRN: [id number]              PROGRESS NOTE  DATE: 11/29/13  FACILITY: Nursing Home Location: [facility]  LEVEL OF CARE: SNF (31)  Acute Visit  CHIEF COMPLAINT:  Discharge Notes  HISTORY OF PRESENT ILLNESS: This is an 80 year old female who is for discharge home with Home health PT, OT and Nursing. She has been admitted to [facility] on 11/19/13 from [facility] with Recurrent UTI. Patient was admitted to this facility for short-term rehabilitation after the patient's recent hospitalization.  Patient has completed SNF rehabilitation and therapy has cleared the patient for discharge.  REASSESSMENT OF ONGOING PROBLEM(S):  HTN: Pt 's HTN remains stable.  Denies CP, sob, DOE, pedal edema, headaches, dizziness or visual disturbances.  No complications from the medications currently being used.  Last BP : 118/70  COPD: the COPD remains stable.  Pt denies sob, cough, wheezing or declining exercise tolerance.  No complications from the medications presently being used.  GERD: pt's GERD is stable.  Denies ongoing heartburn, abd. Pain, nausea or vomiting.  Currently on a PPI & tolerates it without any adverse reactions.   PAST MEDICAL HISTORY : Reviewed.  No changes/see problem list  CURRENT MEDICATIONS: Reviewed per MAR/see medication list  REVIEW OF SYSTEMS:  GENERAL: no change in appetite, no fatigue, no weight changes, no fever, chills or weakness RESPIRATORY: no cough, SOB, DOE, wheezing, hemoptysis CARDIAC: no chest pain, edema or palpitations GI: no abdominal pain, diarrhea, constipation, heart burn, nausea or vomiting   PHYSICAL EXAMINATION  GENERAL: no acute distress, normal body habitus NECK: supple, trachea midline, no neck masses, no thyroid tenderness, no thyromegaly LYMPHATICS: no LAN in the neck, no supraclavicular LAN RESPIRATORY: breathing is even & unlabored, BS CTAB CARDIAC: RRR, no  murmur,no extra heart sounds, no edema GI: abdomen soft, normal BS, no masses, no tenderness, no hepatomegaly, no splenomegaly EXTREMITIES:  Able to move all 4 extremities PSYCHIATRIC: the patient is alert & oriented to person, affect & behavior appropriate   LABS/RADIOLOGY: 11/25/13  total protein 5.9 albumin 3.4  GLOB 2.5  T BIL 0.4  D BIL 0.1  ALP 68  AST 11  ALT 15 Labs reviewed: Basic Metabolic Panel:  Recent Labs  03/30/13 1338  11/17/13 0500 11/18/13 0505 11/19/13 0530 11/19/13 2153  NA 138  < > 136* 137 141  --   K 4.3  < > 3.8 4.2 4.4  --   CL 103  < > 103 104 104  --   CO2 26  < > 20 24 26   --   GLUCOSE 103*  < > 105* 94 102*  --   BUN 6  < > 16 14 8   --   CREATININE 0.76  < > 0.79 0.79 0.71  --   CALCIUM 9.4  < > 8.5 8.3* 8.7  --   MG 1.9  --   --   --   --  2.0  PHOS 3.1  --   --   --   --   --   < > = values in this interval not displayed. Liver Function Tests:  Recent Labs  11/17/13 0500 11/18/13 0505 11/19/13 0530  AST 153* 54* 26  ALT 117* 66* 47*  ALKPHOS 65 60 62  BILITOT 0.6 0.2* 0.3  PROT 6.0 5.3* 5.9*  ALBUMIN 3.0* 2.6* 3.0*  Recent Labs  11/16/13 1826 11/17/13 0815  LIPASE 158* 93*   No results found for this basename: AMMONIA,  in the last 8760 hours CBC:  Recent Labs  07/25/13 0355  09/28/13 1540 11/16/13 1826 11/17/13 0500 11/18/13 0505  WBC 16.6*  < > 7.8 17.6* 17.4* 8.2  NEUTROABS 14.5*  --  4.7 16.6*  --   --   HGB 11.3*  < > 12.4 12.3 11.4* 10.3*  HCT 34.7*  < > 37.3 36.4 34.5* 31.8*  MCV 88.1  < > 88.1 84.5 85.4 86.4  PLT 242  < > 242.0 169 164 155  < > = values in this interval not displayed.  Lipid Panel:  Recent Labs  07/27/13 0405  HDL 25*   Cardiac Enzymes:  Recent Labs  07/24/13 2153  TROPONINI <0.30   CBG:  Recent Labs  07/17/13 0728 07/18/13 0752  GLUCAP 109* 97    ASSESSMENT/PLAN:  Hypertension - well controlled; continue losartan and Norvasc COPD - stable; continue Symbicort and  Flonase Anxiety - stable; continue Valium at bedtime GERD - stable; continue Prilosec    I have filled out patient's discharge paperwork and written prescriptions.  Patient will receive home health PT, OT and Nursing.  Total discharge time: Less than 30 minutes  Discharge time involved coordination of the discharge process with Education officer, museum, nursing staff and therapy department. Medical justification for home health services verified.   CPT CODE: 87867  Seth Bake - NP [facility] [phone]

## 2013-12-03 DIAGNOSIS — F411 Generalized anxiety disorder: Secondary | ICD-10-CM | POA: Diagnosis not present

## 2013-12-03 DIAGNOSIS — N390 Urinary tract infection, site not specified: Secondary | ICD-10-CM | POA: Diagnosis not present

## 2013-12-03 DIAGNOSIS — J449 Chronic obstructive pulmonary disease, unspecified: Secondary | ICD-10-CM | POA: Diagnosis not present

## 2013-12-03 DIAGNOSIS — I10 Essential (primary) hypertension: Secondary | ICD-10-CM | POA: Diagnosis not present

## 2013-12-03 DIAGNOSIS — R262 Difficulty in walking, not elsewhere classified: Secondary | ICD-10-CM | POA: Diagnosis not present

## 2013-12-05 ENCOUNTER — Ambulatory Visit (INDEPENDENT_AMBULATORY_CARE_PROVIDER_SITE_OTHER): Payer: Medicare Other | Admitting: Critical Care Medicine

## 2013-12-05 ENCOUNTER — Ambulatory Visit (INDEPENDENT_AMBULATORY_CARE_PROVIDER_SITE_OTHER)
Admission: RE | Admit: 2013-12-05 | Discharge: 2013-12-05 | Disposition: A | Discharge status: Home / Self Care | Payer: Medicare Other | Source: Ambulatory Visit | Attending: Critical Care Medicine | Admitting: Critical Care Medicine

## 2013-12-05 ENCOUNTER — Encounter: Payer: Self-pay | Admitting: Critical Care Medicine

## 2013-12-05 ENCOUNTER — Telehealth: Payer: Self-pay | Admitting: Internal Medicine

## 2013-12-05 VITALS — BP 106/64 | HR 74 | Temp 97.9°F | Ht 61.0 in | Wt 114.6 lb

## 2013-12-05 DIAGNOSIS — I2510 Atherosclerotic heart disease of native coronary artery without angina pectoris: Secondary | ICD-10-CM | POA: Diagnosis not present

## 2013-12-05 DIAGNOSIS — J189 Pneumonia, unspecified organism: Secondary | ICD-10-CM

## 2013-12-05 DIAGNOSIS — J019 Acute sinusitis, unspecified: Secondary | ICD-10-CM

## 2013-12-05 DIAGNOSIS — J438 Other emphysema: Secondary | ICD-10-CM

## 2013-12-05 DIAGNOSIS — J984 Other disorders of lung: Secondary | ICD-10-CM | POA: Diagnosis not present

## 2013-12-05 DIAGNOSIS — J439 Emphysema, unspecified: Secondary | ICD-10-CM

## 2013-12-05 MED ORDER — FLUTICASONE PROPIONATE 50 MCG/ACT NA SUSP: 2.0000 | Freq: Every day | NASAL | Status: DC

## 2013-12-05 MED ORDER — DOXYCYCLINE HYCLATE 100 MG PO CAPS: 100.0000 mg | ORAL_CAPSULE | Freq: Two times a day (BID) | ORAL | Status: DC

## 2013-12-05 NOTE — Progress Notes (Signed)
Subjective:    Patient ID: [name] [name], female    DOB: 31-May-1923, 80 y.o.   MRN: [id number]  HPI  This is a 80 y.o. 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 The pt also has hx of upper airway obstruction with difficult intubation with prior operative procedures. Also hx Breast CA s/p resection and XRT. Hx Gallstone disease with conservative Rx due to difficult intubation status.    12/05/2013 Chief Complaint  Patient presents with  . Acute Visit    with cxr.  Was dx'd with mild pna while at [facility] and tx'd with doxy.  Was d/c'd home on Friday.  Still not feeling well - chest pain, increased SOB, PND, and cough with occas white to yellow mucus.  Felt feverish last night.    Pt was in hosp for uti 7/8 - 7/12.  At [facility] ?PNA.  Pt lost weight. Weight down to 105# mild PNA doxy 100 bid.   Pt there until Friday and then d/c to home  Now at [employer] apt.  Now not as good.  ? Fever. Chest tight.  Notes some cough, throat is sore, notes pndrip.  Yellow mucus.   Just has oxygen at night     Review of Systems  As above     Objective:   Physical Exam BP 106/64  Pulse 74  Temp(Src) 97.9 F (36.6 C) (Oral)  Ht 5\' 1"  (1.549 m)  Wt 114 lb 9.6 oz (51.982 kg)  BMI 21.66 kg/m2  SpO2 98%  GEN: A/Ox3;  , NAD, elderly  HEENT:  [place]/AT,  EACs-clear, TMs-wnl, NOSE-clear, THROAT-clear, no lesions, +++ postnasal drip also bilateral purulence seen   NECK:  Supple w/ fair ROM; no JVD; normal carotid impulses w/o bruits; no thyromegaly or nodules palpated; no lymphadenopathy.  RESP  Coarse BS w/ no wheezingno accessory muscle use,   CARD:  RRR, no m/r/g  , no peripheral edema, pulses intact, no cyanosis or clubbing.  Musco: Warm bil, no deformities or joint swelling noted.   Neuro: alert, no focal deficits noted.    Skin: Warm, no lesions rash noted over anterior trunk and thorax    Dg Chest 2  View  12/05/2013   CLINICAL DATA:  Followup pneumonia. Short of breath and right chest pain. Pulmonary fibrosis. Hypertension. COPD.  EXAM: CHEST  2 VIEW  COMPARISON:  11/16/2013 and 09/28/2013.  FINDINGS: Mild pectus excavatum deformity. Surgical clips projecting over the lateral right chest. Remote left rib trauma. Midline trachea. Moderate cardiomegaly. Atherosclerosis in the transverse aorta. No pleural effusion or pneumothorax. No congestive failure. No lobar consolidation. Lower lobe predominant interstitial thickening. Scarring in the left mid lung laterally, likely the left upper lobe.  IMPRESSION: No evidence of pneumonia or acute disease.  Cardiomegaly with chronic interstitial thickening and left-sided scarring.   Electronically Signed   By: Abigail Miyamoto M.D.   On: 12/05/2013 15:50         Assessment & Plan:   COPD with emphysema Gold B Gold stage B. COPD with now acute sinusitis and tracheobronchitis and flare Plan Doxycycline 100 mg twice a day for 5 days Increase Flonase to 2 sprays each nostril daily Maintain Symbicort   Updated Medication List Outpatient Encounter Prescriptions as of 12/05/2013  Medication Sig  . albuterol (PROVENTIL) (2.5 MG/3ML) 0.083% nebulizer solution Take 3 mLs (2.5 mg total) by nebulization every 6 (six) hours as needed for wheezing or shortness  of breath.  . AMBULATORY NON FORMULARY MEDICATION Oxygen at bedtime-2 lpm  . amLODipine (NORVASC) 2.5 MG tablet Take 2.5 mg by mouth daily.  Marland Kitchen aspirin 325 MG tablet Take 325 mg by mouth daily.  . budesonide-formoterol (SYMBICORT) 160-4.5 MCG/ACT inhaler Inhale 2 puffs into the lungs 2 (two) times daily.  . diazepam (VALIUM) 5 MG tablet Take 2.5-5 mg by mouth at bedtime. For anxiety  . fluticasone (FLONASE) 50 MCG/ACT nasal spray Place 2 sprays into both nostrils daily.  Marland Kitchen losartan (COZAAR) 50 MG tablet Take 50 mg by mouth daily.  . nitroGLYCERIN (NITROSTAT) 0.4 MG SL tablet Place 0.4 mg under the tongue every  5 (five) minutes as needed for chest pain.  Marland Kitchen omeprazole (PRILOSEC) 20 MG capsule Take 20 mg by mouth daily.  . polyethylene glycol (MIRALAX / GLYCOLAX) packet Take 17 g by mouth daily as needed. For constipation  . senna (SENOKOT) 8.6 MG TABS tablet Take 1 tablet by mouth daily as needed. For constipation  . [DISCONTINUED] fluticasone (FLONASE) 50 MCG/ACT nasal spray Place 1 spray into both nostrils daily.  Marland Kitchen doxycycline (VIBRAMYCIN) 100 MG capsule Take 1 capsule (100 mg total) by mouth 2 (two) times daily.  . [DISCONTINUED] nitrofurantoin (MACRODANTIN) 100 MG capsule Take 1 capsule (100 mg total) by mouth 2 (two) times daily.

## 2013-12-05 NOTE — Assessment & Plan Note (Signed)
Gold stage B. COPD with now acute sinusitis and tracheobronchitis and flare Plan Doxycycline 100 mg twice a day for 5 days Increase Flonase to 2 sprays each nostril daily Maintain Symbicort

## 2013-12-05 NOTE — Telephone Encounter (Signed)
Nurse calling to report pt has been d/c from hospital and will need orders for nursing, pt and ot.  Please return call to [place] at [phone]

## 2013-12-05 NOTE — Patient Instructions (Signed)
Take doxycycline one twice daily for 5 days Increase flonase to two puff each nostril daily Stay on symbicort No change in oxygen dosing Return 2 months

## 2013-12-06 ENCOUNTER — Ambulatory Visit
Admission: RE | Admit: 2013-12-06 | Discharge: 2013-12-06 | Disposition: A | Discharge status: Home / Self Care | Payer: Medicare Other | Source: Ambulatory Visit | Attending: Surgery | Admitting: Surgery

## 2013-12-06 DIAGNOSIS — R922 Inconclusive mammogram: Secondary | ICD-10-CM | POA: Diagnosis not present

## 2013-12-06 DIAGNOSIS — Z853 Personal history of malignant neoplasm of breast: Secondary | ICD-10-CM

## 2013-12-06 DIAGNOSIS — F411 Generalized anxiety disorder: Secondary | ICD-10-CM | POA: Diagnosis not present

## 2013-12-06 DIAGNOSIS — N390 Urinary tract infection, site not specified: Secondary | ICD-10-CM | POA: Diagnosis not present

## 2013-12-06 DIAGNOSIS — J449 Chronic obstructive pulmonary disease, unspecified: Secondary | ICD-10-CM | POA: Diagnosis not present

## 2013-12-06 DIAGNOSIS — I10 Essential (primary) hypertension: Secondary | ICD-10-CM | POA: Diagnosis not present

## 2013-12-06 DIAGNOSIS — R262 Difficulty in walking, not elsewhere classified: Secondary | ICD-10-CM | POA: Diagnosis not present

## 2013-12-06 NOTE — Telephone Encounter (Signed)
Ok per Dr Leanne Chang, verbal orders given to Roylene Reason, RN

## 2013-12-08 DIAGNOSIS — F411 Generalized anxiety disorder: Secondary | ICD-10-CM | POA: Diagnosis not present

## 2013-12-08 DIAGNOSIS — N390 Urinary tract infection, site not specified: Secondary | ICD-10-CM | POA: Diagnosis not present

## 2013-12-08 DIAGNOSIS — I10 Essential (primary) hypertension: Secondary | ICD-10-CM | POA: Diagnosis not present

## 2013-12-08 DIAGNOSIS — J449 Chronic obstructive pulmonary disease, unspecified: Secondary | ICD-10-CM | POA: Diagnosis not present

## 2013-12-08 DIAGNOSIS — R262 Difficulty in walking, not elsewhere classified: Secondary | ICD-10-CM | POA: Diagnosis not present

## 2013-12-09 ENCOUNTER — Telehealth: Payer: Self-pay | Admitting: Internal Medicine

## 2013-12-09 DIAGNOSIS — I10 Essential (primary) hypertension: Secondary | ICD-10-CM | POA: Diagnosis not present

## 2013-12-09 DIAGNOSIS — F411 Generalized anxiety disorder: Secondary | ICD-10-CM | POA: Diagnosis not present

## 2013-12-09 DIAGNOSIS — J449 Chronic obstructive pulmonary disease, unspecified: Secondary | ICD-10-CM | POA: Diagnosis not present

## 2013-12-09 DIAGNOSIS — R262 Difficulty in walking, not elsewhere classified: Secondary | ICD-10-CM | POA: Diagnosis not present

## 2013-12-09 DIAGNOSIS — N390 Urinary tract infection, site not specified: Secondary | ICD-10-CM | POA: Diagnosis not present

## 2013-12-09 NOTE — Telephone Encounter (Signed)
FYI

## 2013-12-09 NOTE — Telephone Encounter (Signed)
Pt is doing well with dressing and upper extremities, will discontinue occupation therapy.  However, physical therapist will still come out for lower extremities.

## 2013-12-13 DIAGNOSIS — J449 Chronic obstructive pulmonary disease, unspecified: Secondary | ICD-10-CM | POA: Diagnosis not present

## 2013-12-13 DIAGNOSIS — N390 Urinary tract infection, site not specified: Secondary | ICD-10-CM | POA: Diagnosis not present

## 2013-12-13 DIAGNOSIS — F411 Generalized anxiety disorder: Secondary | ICD-10-CM | POA: Diagnosis not present

## 2013-12-13 DIAGNOSIS — R262 Difficulty in walking, not elsewhere classified: Secondary | ICD-10-CM | POA: Diagnosis not present

## 2013-12-13 DIAGNOSIS — I10 Essential (primary) hypertension: Secondary | ICD-10-CM | POA: Diagnosis not present

## 2013-12-15 ENCOUNTER — Ambulatory Visit (INDEPENDENT_AMBULATORY_CARE_PROVIDER_SITE_OTHER): Payer: Medicare Other | Admitting: Family Medicine

## 2013-12-15 ENCOUNTER — Encounter: Payer: Self-pay | Admitting: Family Medicine

## 2013-12-15 VITALS — BP 110/64 | HR 88 | Temp 98.0°F | Wt 112.0 lb

## 2013-12-15 DIAGNOSIS — J441 Chronic obstructive pulmonary disease with (acute) exacerbation: Secondary | ICD-10-CM

## 2013-12-15 DIAGNOSIS — J439 Emphysema, unspecified: Secondary | ICD-10-CM

## 2013-12-15 DIAGNOSIS — J438 Other emphysema: Secondary | ICD-10-CM | POA: Diagnosis not present

## 2013-12-15 MED ORDER — PREDNISONE 20 MG PO TABS: 20.0000 mg | ORAL_TABLET | Freq: Every day | ORAL | Status: DC

## 2013-12-15 MED ORDER — AMOXICILLIN-POT CLAVULANATE 500-125 MG PO TABS: 1.0000 | ORAL_TABLET | Freq: Three times a day (TID) | ORAL | Status: DC

## 2013-12-15 NOTE — Assessment & Plan Note (Signed)
Sinusitis resolved with previous treatment. Now patient with increased sputum production, shortness of breath, and increased cough likely indicating COPD exacerbation (only mild occasional wheeze on exam is only inconsistent piece). Treat with augmentin and prednisone x 7 days. 2 courses of doxycycline recently.

## 2013-12-15 NOTE — Patient Instructions (Addendum)
COPD exacerbation (increased shortness of breath, sputum production, cough frequency)  Augmentin and prednisone x 7 days  Call or return to clinic as needed if these symptoms worsen or fail to improve as anticipated (within a few days).  Held off on x-ray and labs but would need to consider these if no improvement

## 2013-12-15 NOTE — Progress Notes (Signed)
  Garret Reddish, MD Phone: [phone]  Subjective:   [name] [name] is a 80 y.o. year old very pleasant female patient who presents with the following:  Cough/increased sputum production Over last 2-3 days, Increased cough and increased sputum production. Using nebulizer more frequently. Feels mild increase of shortness of breath. Not hearing a lot of wheeze. Increased weakness. Decreased appetite. Weakness and appetite issues since she saw Dr. Joya Gaskins on 12/05/13 and treated for sinusitis with doxycycline with only mild short term improvement.  ROS- no fevers, chills, vomiting. Mild nausea.   Past Medical History-  Anxiety, hx CVA, hx breast cancer, COPD Gold B, raynauds, GERD, HTN Social history- nonsmoker-former smoker. Lives by herself  Medications- reviewed and updated Current Outpatient Prescriptions  Medication Sig Dispense Refill  . albuterol (PROVENTIL) (2.5 MG/3ML) 0.083% nebulizer solution Take 3 mLs (2.5 mg total) by nebulization every 6 (six) hours as needed for wheezing or shortness of breath.      . AMBULATORY NON FORMULARY MEDICATION Oxygen at bedtime-2 lpm      . amLODipine (NORVASC) 2.5 MG tablet Take 2.5 mg by mouth daily.      Marland Kitchen aspirin 325 MG tablet Take 325 mg by mouth daily.      . budesonide-formoterol (SYMBICORT) 160-4.5 MCG/ACT inhaler Inhale 2 puffs into the lungs 2 (two) times daily.      . diazepam (VALIUM) 5 MG tablet Take 2.5-5 mg by mouth at bedtime. For anxiety      . fluticasone (FLONASE) 50 MCG/ACT nasal spray Place 2 sprays into both nostrils daily.      Marland Kitchen losartan (COZAAR) 50 MG tablet Take 50 mg by mouth daily.      . nitroGLYCERIN (NITROSTAT) 0.4 MG SL tablet Place 0.4 mg under the tongue every 5 (five) minutes as needed for chest pain.      Marland Kitchen omeprazole (PRILOSEC) 20 MG capsule Take 20 mg by mouth daily.      . polyethylene glycol (MIRALAX / GLYCOLAX) packet Take 17 g by mouth daily as needed. For constipation      . senna (SENOKOT) 8.6 MG TABS tablet  Take 1 tablet by mouth daily as needed. For constipation         Objective: BP 110/64  Pulse 88  Temp(Src) 98 F (36.7 C)  Wt 112 lb (50.803 kg)  SpO2 92% Gen/pulm observations: resting in chair, mild supraclavicular retractions, lifting shoulders to breath, mildly labored, pulse ox 92% HEENT: no sinus tenderness, turbinates normal, oropharynx normal CV: RRR no murmurs rubs or gallops Lungs: prolonged expiration, only once hearing a mild wheeze, occasional rhonchi ext: no edema Skin: warm, dry, no rash  Assessment/Plan:  COPD with emphysema Gold B Sinusitis resolved with previous treatment. Now patient with increased sputum production, shortness of breath, and increased cough likely indicating COPD exacerbation (only mild occasional wheeze on exam is only inconsistent piece). Treat with augmentin and prednisone x 7 days. 2 courses of doxycycline recently.   treating early and aggressively given age and comorbidities.    Meds ordered this encounter  Medications  . amoxicillin-clavulanate (AUGMENTIN) 500-125 MG per tablet    Sig: Take 1 tablet (500 mg total) by mouth 3 (three) times daily.    Dispense:  21 tablet    Refill:  0  . predniSONE (DELTASONE) 20 MG tablet    Sig: Take 1 tablet (20 mg total) by mouth daily with breakfast.    Dispense:  14 tablet    Refill:  0

## 2013-12-16 DIAGNOSIS — I10 Essential (primary) hypertension: Secondary | ICD-10-CM | POA: Diagnosis not present

## 2013-12-16 DIAGNOSIS — J449 Chronic obstructive pulmonary disease, unspecified: Secondary | ICD-10-CM | POA: Diagnosis not present

## 2013-12-16 DIAGNOSIS — F411 Generalized anxiety disorder: Secondary | ICD-10-CM | POA: Diagnosis not present

## 2013-12-16 DIAGNOSIS — N390 Urinary tract infection, site not specified: Secondary | ICD-10-CM | POA: Diagnosis not present

## 2013-12-16 DIAGNOSIS — R262 Difficulty in walking, not elsewhere classified: Secondary | ICD-10-CM | POA: Diagnosis not present

## 2013-12-19 DIAGNOSIS — I10 Essential (primary) hypertension: Secondary | ICD-10-CM | POA: Diagnosis not present

## 2013-12-19 DIAGNOSIS — F411 Generalized anxiety disorder: Secondary | ICD-10-CM | POA: Diagnosis not present

## 2013-12-19 DIAGNOSIS — J449 Chronic obstructive pulmonary disease, unspecified: Secondary | ICD-10-CM | POA: Diagnosis not present

## 2013-12-19 DIAGNOSIS — R262 Difficulty in walking, not elsewhere classified: Secondary | ICD-10-CM | POA: Diagnosis not present

## 2013-12-19 DIAGNOSIS — N390 Urinary tract infection, site not specified: Secondary | ICD-10-CM | POA: Diagnosis not present

## 2013-12-20 ENCOUNTER — Ambulatory Visit (INDEPENDENT_AMBULATORY_CARE_PROVIDER_SITE_OTHER): Payer: Medicare Other | Admitting: Physician Assistant

## 2013-12-20 ENCOUNTER — Encounter: Payer: Self-pay | Admitting: Physician Assistant

## 2013-12-20 VITALS — BP 100/70 | HR 69 | Temp 98.8°F | Resp 20 | Wt 112.0 lb

## 2013-12-20 DIAGNOSIS — I2510 Atherosclerotic heart disease of native coronary artery without angina pectoris: Secondary | ICD-10-CM

## 2013-12-20 DIAGNOSIS — N390 Urinary tract infection, site not specified: Secondary | ICD-10-CM | POA: Diagnosis not present

## 2013-12-20 DIAGNOSIS — J441 Chronic obstructive pulmonary disease with (acute) exacerbation: Secondary | ICD-10-CM

## 2013-12-20 DIAGNOSIS — J449 Chronic obstructive pulmonary disease, unspecified: Secondary | ICD-10-CM | POA: Diagnosis not present

## 2013-12-20 DIAGNOSIS — I10 Essential (primary) hypertension: Secondary | ICD-10-CM | POA: Diagnosis not present

## 2013-12-20 DIAGNOSIS — F411 Generalized anxiety disorder: Secondary | ICD-10-CM | POA: Diagnosis not present

## 2013-12-20 DIAGNOSIS — R262 Difficulty in walking, not elsewhere classified: Secondary | ICD-10-CM | POA: Diagnosis not present

## 2013-12-20 NOTE — Progress Notes (Signed)
Subjective:    Patient ID: [name] [name], female    DOB: 1923/07/22, 80 y.o.   MRN: [id number]  Cough This is a chronic problem. The current episode started 1 to 4 weeks ago (2 weeks). The problem has been waxing and waning. The problem occurs every few minutes. The cough is productive of purulent sputum. Associated symptoms include shortness of breath ([name] [name] home spo2s have been fluctuating from mid to low 90s.). Pertinent negatives include no chest pain, chills, ear congestion, ear pain, fever, headaches, heartburn, hemoptysis, myalgias, nasal congestion, postnasal drip, rash, rhinorrhea, sore throat, sweats, weight loss or wheezing. Nothing aggravates the symptoms. Treatments tried: [name] [name] has had 2 courses of doxycycline over the past month, and also augmentin. for the last week. The treatment provided no relief. [name] [name] past medical history is significant for COPD. There is no history of asthma or environmental allergies.      Review of Systems  Constitutional: Positive for fatigue. Negative for fever, chills and weight loss.  HENT: Negative for ear pain, postnasal drip, rhinorrhea and sore throat.   Respiratory: Positive for cough and shortness of breath ([name] [name] home spo2s have been fluctuating from mid to low 90s.). Negative for hemoptysis and wheezing.   Cardiovascular: Negative for chest pain.  Gastrointestinal: Negative for heartburn, nausea, vomiting and diarrhea.  Musculoskeletal: Negative for myalgias.  Skin: Negative for rash.  Allergic/Immunologic: Negative for environmental allergies.  Neurological: Negative for syncope and headaches.  All other systems reviewed and are negative.     Past Medical History  Diagnosis Date  . ANXIETY 08/16/2008  . CAD, UNSPECIFIED SITE 10/17/2008  . CHOLELITHIASIS 08/23/2008  . COPD 01/04/2010    FeV1 101%, DLCO64% 2008  . 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 DIVERTICULOSIS-COLON 04/07/2008  . GERD 04/19/2007  . HYPERSOMNIA, ASSOCIATED WITH  SLEEP APNEA 02/01/2007  . HYPERTENSION 11/12/2006  . MITRAL VALVE PROLAPSE, HX OF 10/07/2008  . NEOPLASM, MALIGNANT, BREAST, RIGHT 02/15/2008  . OSTEOPOROSIS 04/04/2008  . Raynaud's syndrome 04/25/2009  . Airway obstruction   . History of diverticulitis of colon   . Tortuous colon   . Anal stricture   . Chronic constipation   . Fatty liver   . Hiatal hernia   . Esophageal stricture   . IBS (irritable bowel syndrome)   . Pulmonary fibrosis   . Sleep apnea   . COPD (chronic obstructive pulmonary disease) 01/04/2010  . Colitis     sigmoid  . Candida esophagitis   . Diverticulitis   . Hearing aid worn     bilateral  . Atrial fibrillation     [name] [name] denies known hx of afib 02/24/13  . Complication of anesthesia     difficult intubation    History   Social History  . Marital Status: Widowed    Spouse Name: N/A    Number of Children: 3  . Years of Education: N/A   Occupational History  . retireed    Social History Main Topics  . Smoking status: Former Smoker -- 2.00 packs/day for 30 years    Types: Cigarettes    Quit date: 05/13/1983  . Smokeless tobacco: Never Used  . Alcohol Use: Yes     Comment: 1-2 glaases wine per day  . Drug Use: No  . Sexual Activity: No   Other Topics Concern  . Not on file   Social History Narrative   Living in assisted living    Past Surgical History  Procedure Laterality Date  . Abdominal hysterectomy    .  Bilateral salpingoophorectomy      s/p prolapsed bladder  . Bladder surgery      prolapsed  . Thoracotomy      granuloma, pulmonary-thoracotomy  . Breast surgery  07-21-07    mastectomy (right), all margins neg and LNs neg   . Appendectomy    . Tonsillectomy    . Rotator cuff repair    . Mastectomy      right  . Breast cyst excision      left  . Mouth surgery    . Eye surgery      cataract removal bilateral  . Melanoma excision      rt clavicle Dr Rhoderick Moody office)  . Lung surgery      Family History  Problem Relation  Age of Onset  . Parkinsonism Father   . Coronary artery disease Father   . Breast cancer      aunt  . Colon cancer      aunt, age 4  . Uterine cancer      aunt  . Stroke Mother   . Heart failure Mother   . Breast cancer Maternal Aunt   . Colon cancer Maternal Aunt   . Uterine cancer Maternal Aunt     Allergies  Allergen Reactions  . Azithromycin Hives, Diarrhea, Dermatitis and Rash  . Ciprofloxacin Swelling  . Levaquin [Levofloxacin] Hives  . Penicillins Hives    has had mild doses that [name] [name] did not have reactions to  . Prednisone     Unknown   . Sulfamethoxazole Nausea And Vomiting    Current Outpatient Prescriptions on File Prior to Visit  Medication Sig Dispense Refill  . albuterol (PROVENTIL) (2.5 MG/3ML) 0.083% nebulizer solution Take 3 mLs (2.5 mg total) by nebulization every 6 (six) hours as needed for wheezing or shortness of breath.      . AMBULATORY NON FORMULARY MEDICATION Oxygen at bedtime-2 lpm      . amLODipine (NORVASC) 2.5 MG tablet Take 2.5 mg by mouth daily.      Marland Kitchen amoxicillin-clavulanate (AUGMENTIN) 500-125 MG per tablet Take 1 tablet (500 mg total) by mouth 3 (three) times daily.  21 tablet  0  . aspirin 325 MG tablet Take 325 mg by mouth daily.      . budesonide-formoterol (SYMBICORT) 160-4.5 MCG/ACT inhaler Inhale 2 puffs into the lungs 2 (two) times daily.      . diazepam (VALIUM) 5 MG tablet Take 2.5-5 mg by mouth at bedtime. For anxiety      . fluticasone (FLONASE) 50 MCG/ACT nasal spray Place 2 sprays into both nostrils daily.      Marland Kitchen losartan (COZAAR) 50 MG tablet Take 50 mg by mouth daily.      . nitroGLYCERIN (NITROSTAT) 0.4 MG SL tablet Place 0.4 mg under the tongue every 5 (five) minutes as needed for chest pain.      Marland Kitchen omeprazole (PRILOSEC) 20 MG capsule Take 20 mg by mouth daily.      . polyethylene glycol (MIRALAX / GLYCOLAX) packet Take 17 g by mouth daily as needed. For constipation      . predniSONE (DELTASONE) 20 MG tablet Take 1 tablet  (20 mg total) by mouth daily with breakfast.  14 tablet  0  . senna (SENOKOT) 8.6 MG TABS tablet Take 1 tablet by mouth daily as needed. For constipation       No current facility-administered medications on file prior to visit.    EXAM: BP 100/70  Pulse 69  Temp(Src)  98.8 F (37.1 C) (Oral)  Resp 20  Wt 112 lb (50.803 kg)  SpO2 96%     Objective:   Physical Exam  Nursing note and vitals reviewed. Constitutional: [name] [name] is oriented to person, place, and time. [name] [name] appears well-developed and well-nourished. No distress.  HENT:  Head: Normocephalic and atraumatic.  Eyes: Conjunctivae and EOM are normal. Pupils are equal, round, and reactive to light.  Cardiovascular: Normal rate, regular rhythm and intact distal pulses.   Pulmonary/Chest: Effort normal. No respiratory distress. [name] [name] has no wheezes. [name] [name] has no rales. [name] [name] exhibits no tenderness.  Mild rhonchi bilateral lung bases.  Neurological: [name] [name] is alert and oriented to person, place, and time.  Skin: Skin is warm and dry. [name] [name] is not diaphoretic.  Psychiatric: [name] [name] has a normal mood and affect. [name] [name] behavior is normal. Judgment and thought content normal.     Lab Results  Component Value Date   WBC 8.2 11/18/2013   HGB 10.3* 11/18/2013   HCT 31.8* 11/18/2013   PLT 155 11/18/2013   GLUCOSE 102* 11/19/2013   CHOL 126 07/27/2013   TRIG 87 07/27/2013   HDL 25* 07/27/2013   LDLDIRECT 138.7 12/29/2007   LDLCALC 84 07/27/2013   ALT 47* 11/19/2013   AST 26 11/19/2013   NA 141 11/19/2013   K 4.4 11/19/2013   CL 104 11/19/2013   CREATININE 0.71 11/19/2013   BUN 8 11/19/2013   CO2 26 11/19/2013   TSH 1.230 11/17/2013   INR 0.9 07/19/2007   HGBA1C 5.6 11/17/2013        Assessment & Plan:  [name] [name] was seen today for follow-up.  Diagnoses and associated orders for this visit:  COPD exacerbation Comments: Resolving? Will continue prednisone and augmentin until completed courses. Add mucinex and Robitussin-DM. F/u in 2 weeks.    Return precautions  provided, and patient handout on cough.  Plan to follow up in about 2 weeks to reassess, or for worsening or persistent symptoms despite treatment.  Patient Instructions  Continue all your current medications until you've completed the course of both prednisone and Augmentin.  Try taking plain over-the-counter Mucinex to help thin mucus secretions.  Try taking over-the-counter Robitussin-DM to help your cough symptoms.  If emergency symptoms discussed during visit developed, seek medical attention immediately.  Followup in about 2 weeks to reassess, or for worsening or persistent symptoms despite treatment.

## 2013-12-20 NOTE — Patient Instructions (Addendum)
Continue all your current medications until you've completed the course of both prednisone and Augmentin.  Try taking plain over-the-counter Mucinex to help thin mucus secretions.  Try taking over-the-counter Robitussin-DM to help your cough symptoms.  If emergency symptoms discussed during visit developed, seek medical attention immediately.  Followup in about 2 weeks to reassess, or for worsening or persistent symptoms despite treatment.    Cough, Adult  A cough is a reflex. It helps you clear your throat and airways. A cough can help heal your body. A cough can last 2 or 3 weeks (acute) or may last more than 8 weeks (chronic). Some common causes of a cough can include an infection, allergy, or a cold. HOME CARE  Only take medicine as told by your doctor.  If given, take your medicines (antibiotics) as told. Finish them even if you start to feel better.  Use a cold steam vaporizer or humidifier in your home. This can help loosen thick spit (secretions).  Sleep so you are almost sitting up (semi-upright). Use pillows to do this. This helps reduce coughing.  Rest as needed.  Stop smoking if you smoke. GET HELP RIGHT AWAY IF:  You have yellowish-white fluid (pus) in your thick spit.  Your cough gets worse.  Your medicine does not reduce coughing, and you are losing sleep.  You cough up blood.  You have trouble breathing.  Your pain gets worse and medicine does not help.  You have a fever. MAKE SURE YOU:   Understand these instructions.  Will watch your condition.  Will get help right away if you are not doing well or get worse. Document Released: 01/09/2011 Document Revised: 09/12/2013 Document Reviewed: 01/09/2011 [facility] Patient Information 2015 [place], [place]. This information is not intended to replace advice given to you by your health care provider. Make sure you discuss any questions you have with your health care provider.

## 2013-12-20 NOTE — Progress Notes (Signed)
Pre visit review using our clinic review tool, if applicable. No additional management support is needed unless otherwise documented below in the visit note. 

## 2013-12-21 ENCOUNTER — Telehealth: Payer: Self-pay | Admitting: Internal Medicine

## 2013-12-21 ENCOUNTER — Emergency Department (HOSPITAL_COMMUNITY)
Admission: EM | Admit: 2013-12-21 | Discharge: 2013-12-21 | Disposition: A | Discharge status: Home / Self Care | Payer: Medicare Other | Attending: Emergency Medicine | Admitting: Emergency Medicine

## 2013-12-21 ENCOUNTER — Emergency Department (HOSPITAL_COMMUNITY): Payer: Medicare Other

## 2013-12-21 ENCOUNTER — Encounter (HOSPITAL_COMMUNITY): Payer: Self-pay | Admitting: Emergency Medicine

## 2013-12-21 DIAGNOSIS — I2510 Atherosclerotic heart disease of native coronary artery without angina pectoris: Secondary | ICD-10-CM | POA: Insufficient documentation

## 2013-12-21 DIAGNOSIS — Z9089 Acquired absence of other organs: Secondary | ICD-10-CM | POA: Insufficient documentation

## 2013-12-21 DIAGNOSIS — Z7982 Long term (current) use of aspirin: Secondary | ICD-10-CM | POA: Diagnosis not present

## 2013-12-21 DIAGNOSIS — F411 Generalized anxiety disorder: Secondary | ICD-10-CM | POA: Insufficient documentation

## 2013-12-21 DIAGNOSIS — R110 Nausea: Secondary | ICD-10-CM | POA: Diagnosis not present

## 2013-12-21 DIAGNOSIS — K2990 Gastroduodenitis, unspecified, without bleeding: Secondary | ICD-10-CM | POA: Diagnosis not present

## 2013-12-21 DIAGNOSIS — Z853 Personal history of malignant neoplasm of breast: Secondary | ICD-10-CM | POA: Diagnosis not present

## 2013-12-21 DIAGNOSIS — I7300 Raynaud's syndrome without gangrene: Secondary | ICD-10-CM | POA: Insufficient documentation

## 2013-12-21 DIAGNOSIS — Z79899 Other long term (current) drug therapy: Secondary | ICD-10-CM | POA: Diagnosis not present

## 2013-12-21 DIAGNOSIS — Z8619 Personal history of other infectious and parasitic diseases: Secondary | ICD-10-CM | POA: Diagnosis not present

## 2013-12-21 DIAGNOSIS — Z8701 Personal history of pneumonia (recurrent): Secondary | ICD-10-CM | POA: Diagnosis not present

## 2013-12-21 DIAGNOSIS — J449 Chronic obstructive pulmonary disease, unspecified: Secondary | ICD-10-CM | POA: Insufficient documentation

## 2013-12-21 DIAGNOSIS — Z87891 Personal history of nicotine dependence: Secondary | ICD-10-CM | POA: Insufficient documentation

## 2013-12-21 DIAGNOSIS — K219 Gastro-esophageal reflux disease without esophagitis: Secondary | ICD-10-CM | POA: Diagnosis not present

## 2013-12-21 DIAGNOSIS — Z792 Long term (current) use of antibiotics: Secondary | ICD-10-CM | POA: Insufficient documentation

## 2013-12-21 DIAGNOSIS — R05 Cough: Secondary | ICD-10-CM | POA: Diagnosis not present

## 2013-12-21 DIAGNOSIS — J4489 Other specified chronic obstructive pulmonary disease: Secondary | ICD-10-CM | POA: Diagnosis not present

## 2013-12-21 DIAGNOSIS — R059 Cough, unspecified: Secondary | ICD-10-CM | POA: Insufficient documentation

## 2013-12-21 DIAGNOSIS — R1032 Left lower quadrant pain: Secondary | ICD-10-CM | POA: Diagnosis not present

## 2013-12-21 DIAGNOSIS — K5900 Constipation, unspecified: Secondary | ICD-10-CM | POA: Insufficient documentation

## 2013-12-21 DIAGNOSIS — Z880 Allergy status to penicillin: Secondary | ICD-10-CM | POA: Diagnosis not present

## 2013-12-21 DIAGNOSIS — K5641 Fecal impaction: Secondary | ICD-10-CM | POA: Diagnosis not present

## 2013-12-21 DIAGNOSIS — IMO0002 Reserved for concepts with insufficient information to code with codable children: Secondary | ICD-10-CM | POA: Insufficient documentation

## 2013-12-21 DIAGNOSIS — K589 Irritable bowel syndrome without diarrhea: Secondary | ICD-10-CM | POA: Diagnosis not present

## 2013-12-21 DIAGNOSIS — I10 Essential (primary) hypertension: Secondary | ICD-10-CM | POA: Insufficient documentation

## 2013-12-21 DIAGNOSIS — R10819 Abdominal tenderness, unspecified site: Secondary | ICD-10-CM | POA: Diagnosis not present

## 2013-12-21 DIAGNOSIS — K2970 Gastritis, unspecified, without bleeding: Secondary | ICD-10-CM | POA: Diagnosis not present

## 2013-12-21 LAB — CBC WITH DIFFERENTIAL/PLATELET
BASOS PCT: 0 % (ref 0–1)
Basophils Absolute: 0 10*3/uL (ref 0.0–0.1)
EOS ABS: 0 10*3/uL (ref 0.0–0.7)
EOS PCT: 0 % (ref 0–5)
HEMATOCRIT: 40.2 % (ref 36.0–46.0)
HEMOGLOBIN: 13.5 g/dL (ref 12.0–15.0)
Lymphocytes Relative: 4 % — ABNORMAL LOW (ref 12–46)
Lymphs Abs: 0.8 10*3/uL (ref 0.7–4.0)
MCH: 27.9 pg (ref 26.0–34.0)
MCHC: 33.6 g/dL (ref 30.0–36.0)
MCV: 83.1 fL (ref 78.0–100.0)
MONOS PCT: 4 % (ref 3–12)
Monocytes Absolute: 0.9 10*3/uL (ref 0.1–1.0)
Neutro Abs: 18.5 10*3/uL — ABNORMAL HIGH (ref 1.7–7.7)
Neutrophils Relative %: 92 % — ABNORMAL HIGH (ref 43–77)
Platelets: 258 10*3/uL (ref 150–400)
RBC: 4.84 MIL/uL (ref 3.87–5.11)
RDW: 13.7 % (ref 11.5–15.5)
WBC: 20.3 10*3/uL — ABNORMAL HIGH (ref 4.0–10.5)

## 2013-12-21 LAB — BASIC METABOLIC PANEL
Anion gap: 14 (ref 5–15)
BUN: 10 mg/dL (ref 6–23)
CHLORIDE: 97 meq/L (ref 96–112)
CO2: 24 meq/L (ref 19–32)
Calcium: 9.2 mg/dL (ref 8.4–10.5)
Creatinine, Ser: 0.66 mg/dL (ref 0.50–1.10)
GFR calc Af Amer: 89 mL/min — ABNORMAL LOW (ref >90)
GFR calc non Af Amer: 77 mL/min — ABNORMAL LOW (ref >90)
Glucose, Bld: 144 mg/dL — ABNORMAL HIGH (ref 70–99)
Potassium: 4.3 mEq/L (ref 3.7–5.3)
Sodium: 135 mEq/L — ABNORMAL LOW (ref 137–147)

## 2013-12-21 MED ORDER — IOHEXOL 300 MG/ML  SOLN
100.0000 mL | Freq: Once | INTRAMUSCULAR | Status: AC | PRN
  Administered 2013-12-21: 100 mL via INTRAVENOUS

## 2013-12-21 MED ORDER — IOHEXOL 300 MG/ML  SOLN
25.0000 mL | Freq: Once | INTRAMUSCULAR | Status: AC | PRN
  Administered 2013-12-21: 25 mL via ORAL

## 2013-12-21 NOTE — ED Provider Notes (Signed)
5:12 PM Care transferred from Dr. Christy Gentles on 80 y.o. with fecal impaction and abdominal pain. Pt disimpacted by Dr. Christy Gentles, after which she had small BM. CP w/ large stool in rectum c/w impaction, mild secondary proctitis. She has declined further disimpaction attempt or enema in dept. Will d/c home w/ rec for daily miralax, close PCP f/u. Return precautions given for new or worsening symptoms including worsening pain, blood in stool, fever.   1. Fecal impaction in rectum       Ernestina Patches, MD 12/21/13 1713

## 2013-12-21 NOTE — ED Notes (Signed)
Per [facility]- DX with Pneumonia several days ago and started on Augmentin. Pt c/o of impaction today. Has attempted with Colace and Senna without relief. C/o of nausea only. Denies V/D and fever

## 2013-12-21 NOTE — ED Notes (Signed)
Initial Contact - pt A+Ox4, reports "fear of constipation", reports last BM x3 days, pt reports taking senna x4 and giving herself a supository last night.  Pt reports "feels like there's something hanging out".  On eval, perirectal area very red and tender, anus appears normal, pt soiled with loose stool on arrival.  Skin otherwise PWD.  MAEI, self repositioning for comfort.  Pt denies n/v, fevers/chills or other complaints.  NAD.

## 2013-12-21 NOTE — Telephone Encounter (Signed)
Called pt she does not have a ride here to see Dr Yong Channel.  She states that her stool is half in and half out of her rectum and it is causing her a lot of pain.  Instructed pt to call 911 and have them take her to ER.  Pt agreed and said she would call them

## 2013-12-21 NOTE — ED Notes (Signed)
Pt to CT

## 2013-12-21 NOTE — ED Notes (Signed)
Bed: WA01 Expected date:  Expected time:  Means of arrival:  Comments: Bowel impaction

## 2013-12-21 NOTE — ED Notes (Signed)
Manual disimpaction by Dr. Christy Gentles with sm. Amt hard stool removed.

## 2013-12-21 NOTE — ED Notes (Signed)
Pt cleaned of stool and repositioned, barrier cream applied, family at bedside, pt awaiting CT. Denies further needs/complaints.  NAD.

## 2013-12-21 NOTE — ED Notes (Signed)
Pt cleaned of sm. Amt stool incontinence.

## 2013-12-21 NOTE — Telephone Encounter (Signed)
Patient Information:  Caller Name: Ellouise Mcwhirter  Phone: [phone]  Patient: [name] [name]  Gender: Female  DOB: January 29, 1924  Age: 80 Years  PCP: Phoebe Sharps (Adults only, leaving end of July 2015)  Office Follow Up:  Does the office need to follow up with this patient?: Yes  Instructions For The Office: Patient is requesting additional advice, per Dr. Leanne Chang. Patient uses [facility] on [address] at [phone]. Patient can be reached at [phone].  RN Note:  Patient states she developed constipation, onset 12/18/13. Patient states she took Senna X 2 12/20/13 and Dulcolax Suppository 12/21/13 without relief. Patient states she can feel hard stool in her rectum but is unable to pass the stool. Patient states she is having difficulty sitting due to the hard stool that is present in her rectum. Patient states her last bowel movement was 12/17/13. Patient describes last bowel movement on 12/17/13 as small, hard stool. Patient has not taken Miralax, as prescribed.  Care advice and diet advice given per guidelines.  Patient advised increased fluids, warm prune/apple juice. Patient advised to take Miralax as prescribed. Patient advised warm bath. Patient states she does not have a bath tub. Patient advised warm shower.  RN recommended appt. Patient declines appt. Patient states she will try above care advice. Call back parameters reviewed. Patient verbalizes understanding.  Patient is requesting additional advice, per Dr. Leanne Chang. Patient uses [facility] on [address] at [phone]. Patient can be reached at [phone].  Symptoms  Reason For Call & Symptoms: Constipation  Reviewed Health History In EMR: Yes  Reviewed Medications In EMR: Yes  Reviewed Allergies In EMR: Yes  Reviewed Surgeries / Procedures: Yes  Date of Onset of Symptoms: 12/19/2013  Treatments Tried: Senna, Dulcolax Suppository  Treatments Tried Worked: No  Guideline(s) Used:   Constipation  Disposition Per Guideline:   See Today in Office  Reason For Disposition Reached:   Last bowel movement (BM) > 4 days ago  Advice Given:  General Constipation Instructions:  Eat a high fiber diet.  Drink adequate liquids.  High Fiber Diet:  Try to eat fresh fruit and vegetables at each meal (peas, prunes, citrus, apples, beans, corn).  Eat more grain foods (bran flakes, bran muffins, graham crackers, oatmeal, brown rice, and whole wheat bread). Popcorn is a source of fiber.  Liquids:  Drink 6-8 glasses of water a day (Caution: certain medical conditions require fluid restriction).  Prune juice is a natural laxative.  Sitz Bath for Rectal Pain due to Constipation:   Take a 20 minute Sitz bath. Sitting in the warm water may help relax the anal sphincter and release the bowel movement.  You can make a Sitz bath by adding 2 ounces (60 grams) of baking soda to a bathtub containing warm water.  Patient Refused Recommendation:  Patient Refused Care Advice  Patient is requesting additional advice, per Dr. Leanne Chang. Patient uses [facility] on [address] at [phone]. Patient can be reached at [phone].

## 2013-12-21 NOTE — ED Provider Notes (Signed)
CSN: [id number]     Arrival date & time 12/21/13  47 History   First MD Initiated Contact with Patient 12/21/13 1347     Chief Complaint  Patient presents with  . Abdominal Pain  . Fecal Impaction  . Pneumonia      The history is provided by the patient.  Patient presents for multiple complaints:  1. She reports constipation for past 3 days.  She reports it is worsening.  Nothing improves her constipation . She reports she feels impacted.  Attempt at bowel movement causes rectal pain.  She reports nausea but no vomiting.    2.  She also reports increased cough.  She was seen by PCP yesterday who noted continued cough and encouraged to continued prednisone/augmentin (h/o COPD).  She reports recent hospital course for pneumonia but she felt "they let me go home early"    Past Medical History  Diagnosis Date  . ANXIETY 08/16/2008  . CAD, UNSPECIFIED SITE 10/17/2008  . CHOLELITHIASIS 08/23/2008  . COPD 01/04/2010    FeV1 101%, DLCO64% 2008  . 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 DIVERTICULOSIS-COLON 04/07/2008  . GERD 04/19/2007  . HYPERSOMNIA, ASSOCIATED WITH SLEEP APNEA 02/01/2007  . HYPERTENSION 11/12/2006  . MITRAL VALVE PROLAPSE, HX OF 10/07/2008  . NEOPLASM, MALIGNANT, BREAST, RIGHT 02/15/2008  . OSTEOPOROSIS 04/04/2008  . Raynaud's syndrome 04/25/2009  . Airway obstruction   . History of diverticulitis of colon   . Tortuous colon   . Anal stricture   . Chronic constipation   . Fatty liver   . Hiatal hernia   . Esophageal stricture   . IBS (irritable bowel syndrome)   . Pulmonary fibrosis   . Sleep apnea   . COPD (chronic obstructive pulmonary disease) 01/04/2010  . Colitis     sigmoid  . Candida esophagitis   . Diverticulitis   . Hearing aid worn     bilateral  . Atrial fibrillation     she denies known hx of afib 02/24/13  . Complication of anesthesia     difficult intubation   Past Surgical History  Procedure Laterality Date  . Abdominal  hysterectomy    . Bilateral salpingoophorectomy      s/p prolapsed bladder  . Bladder surgery      prolapsed  . Thoracotomy      granuloma, pulmonary-thoracotomy  . Breast surgery  07-21-07    mastectomy (right), all margins neg and LNs neg   . Appendectomy    . Tonsillectomy    . Rotator cuff repair    . Mastectomy      right  . Breast cyst excision      left  . Mouth surgery    . Eye surgery      cataract removal bilateral  . Melanoma excision      rt clavicle Dr Rhoderick [facility] office)  . Lung surgery     Family History  Problem Relation Age of Onset  . Parkinsonism Father   . Coronary artery disease Father   . Breast cancer      aunt  . Colon cancer      aunt, age 42  . Uterine cancer      aunt  . Stroke Mother   . Heart failure Mother   . Breast cancer Maternal Aunt   . Colon cancer Maternal Aunt   . Uterine cancer Maternal Aunt    History  Substance Use Topics  . Smoking status: Former Smoker -- 2.00 packs/day for 30  years    Types: Cigarettes    Quit date: 05/13/1983  . Smokeless tobacco: Never Used  . Alcohol Use: Yes     Comment: 1-2 glaases wine per day   OB History   Grav Para Term Preterm Abortions TAB SAB Ect Mult Living                 Review of Systems  Constitutional: Negative for fever.  Respiratory: Positive for cough.   Gastrointestinal: Positive for constipation and rectal pain. Negative for vomiting.  All other systems reviewed and are negative.     Allergies  Azithromycin; Ciprofloxacin; Levaquin; Penicillins; Prednisone; and Sulfamethoxazole  Home Medications   Prior to Admission medications   Medication Sig Start Date End Date Taking? Authorizing Provider  albuterol (PROVENTIL) (2.5 MG/3ML) 0.083% nebulizer solution Take 3 mLs (2.5 mg total) by nebulization every 6 (six) hours as needed for wheezing or shortness of breath. 07/27/13   Modena Jansky, MD  AMBULATORY NON FORMULARY MEDICATION Oxygen at bedtime-2 lpm    Historical  Provider, MD  amLODipine (NORVASC) 2.5 MG tablet Take 2.5 mg by mouth daily.    Historical Provider, MD  amoxicillin-clavulanate (AUGMENTIN) 500-125 MG per tablet Take 1 tablet (500 mg total) by mouth 3 (three) times daily. 12/15/13   Marin Olp, MD  aspirin 325 MG tablet Take 325 mg by mouth daily.    Historical Provider, MD  budesonide-formoterol (SYMBICORT) 160-4.5 MCG/ACT inhaler Inhale 2 puffs into the lungs 2 (two) times daily.    Historical Provider, MD  diazepam (VALIUM) 5 MG tablet Take 2.5-5 mg by mouth at bedtime. For anxiety 07/27/13   Modena Jansky, MD  fluticasone (FLONASE) 50 MCG/ACT nasal spray Place 2 sprays into both nostrils daily. 12/05/13   Elsie Stain, MD  losartan (COZAAR) 50 MG tablet Take 50 mg by mouth daily.    Historical Provider, MD  nitroGLYCERIN (NITROSTAT) 0.4 MG SL tablet Place 0.4 mg under the tongue every 5 (five) minutes as needed for chest pain.    Historical Provider, MD  omeprazole (PRILOSEC) 20 MG capsule Take 20 mg by mouth daily.    Historical Provider, MD  polyethylene glycol (MIRALAX / GLYCOLAX) packet Take 17 g by mouth daily as needed. For constipation 07/27/13   Modena Jansky, MD  predniSONE (DELTASONE) 20 MG tablet Take 1 tablet (20 mg total) by mouth daily with breakfast. 12/15/13   Marin Olp, MD  senna (SENOKOT) 8.6 MG TABS tablet Take 1 tablet by mouth daily as needed. For constipation 07/27/13   Modena Jansky, MD   BP 158/47  Pulse 77  Temp(Src) 98.2 F (36.8 C) (Oral)  Resp 16  SpO2 98% Physical Exam CONSTITUTIONAL: elderly, frail, coughs frequently on exam HEAD: Normocephalic/atraumatic EYES: EOMI/PERRL ENMT: Mucous membranes moist NECK: supple no meningeal signs SPINE:entire spine nontender CV: S1/S2 noted LUNGS: coarse BS noted bilaterally ABDOMEN: soft, nontender, no rebound or guarding Rectal - skin excoriation noted to rectum but no signs of abscess or cellulitis.  No abscess/fluctuance.  Pt with stool  impaction.  Stool color brown.  Female chaperone present GU:no cva tenderness NEURO: Pt is awake/alert, moves all extremitiesx4 EXTREMITIES: pulses normal, full ROM SKIN: warm, color normal PSYCH: no abnormalities of mood noted  ED Course  Fecal disimpaction Performed by: Sharyon Cable Authorized by: Sharyon Cable Patient identity confirmed: verbally with patient Local anesthesia used: no Patient sedated: no Patient tolerance: Patient tolerated the procedure well with no immediate complications.  Comments: Fecal disimpaction performed to extract large amount of hard stool Pt tolerated well      3:25 PM Pt with focal LLQ tenderness In light of abd tenderness, she does have elevated WBC (though has been on prednisone) Recommend CT imaging At signout to dr docherty, plan is to f/u on CT imaging If negative stable for d/c home  Labs Review Labs Reviewed  BASIC METABOLIC PANEL - Abnormal; Notable for the following:    Sodium 135 (*)    Glucose, Bld 144 (*)    GFR calc non Af Amer 77 (*)    GFR calc Af Amer 89 (*)    All other components within normal limits  CBC WITH DIFFERENTIAL - Abnormal; Notable for the following:    WBC 20.3 (*)    Neutrophils Relative % 92 (*)    Neutro Abs 18.5 (*)    Lymphocytes Relative 4 (*)    All other components within normal limits    Imaging Review Dg Chest Portable 1 View  12/21/2013   CLINICAL DATA:  Shortness of breath and chest pain with productive cough; history of COPD and smoking  EXAM: PORTABLE CHEST - 1 VIEW  COMPARISON:  PA and lateral chest of December 05, 2013  FINDINGS: The right lung is well expanded and grossly clear. On the left there is persistent density in the mid lung overlying the posterior aspect of the left 6 rib which exhibits deformity. The cardiac chambers are enlarged. The pulmonary vascularity is not engorged. There is no pleural effusion.  IMPRESSION: COPD with postsurgical or posttraumatic changes and left  upper hemi thorax. There has not been significant interval change since the earlier study.   Electronically Signed   By: David  Martinique   On: 12/21/2013 14:37     EKG Interpretation   Date/Time:  Wednesday December 21 2013 13:40:46 EDT Ventricular Rate:  74 PR Interval:  226 QRS Duration: 101 QT Interval:  384 QTC Calculation: 426 R Axis:   57 Text Interpretation:  Sinus rhythm Atrial premature complexes Prolonged PR  interval Left atrial enlargement Low voltage, extremity and precordial  leads Confirmed by Christy Gentles  MD, Elenore Rota ([id number]) on 12/21/2013 2:39:49 PM      MDM   Final diagnoses:  None    Nursing notes including past medical history and social history reviewed and considered in documentation Labs/vital reviewed and considered xrays reviewed and considered     Sharyon Cable, MD 12/21/13 1529

## 2013-12-21 NOTE — Discharge Instructions (Signed)
Fecal Impaction °A fecal impaction happens when there is a large, firm amount of stool (or feces) that cannot be passed. The impacted stool is usually in the rectum, which is the lowest part of the large bowel. The impacted stool can block the colon and cause significant problems. °CAUSES  °The longer stool stays in the rectum, the harder it gets. Anything that slows down your bowel movements can lead to fecal impaction, such as: °· Constipation. This can be a long-standing (chronic) problem or can happen suddenly (acute). °· Painful conditions of the rectum, such as hemorrhoids or anal fissures. The pain of these conditions can make you try to avoid having bowel movements. °· Narcotic pain-relieving medicines, such as methadone, morphine, or codeine. °· Not drinking enough fluids. °· Inactivity and bed rest over long periods of time. °· Diseases of the brain or nervous system that damage the nerves controlling the muscles of the intestines. °SIGNS AND SYMPTOMS  °· Lack of normal bowel movements or changes in bowel patterns. °· Sense of fullness in the rectum but unable to pass stool. °· Pain or cramps in the abdominal area (often after meals). °· Thin, watery discharge from the rectum. °DIAGNOSIS  °Your health care provider may suspect that you have a fecal impaction based on your symptoms and a physical exam. This will include an exam of your rectum. Sometimes X-rays or lab testing may be needed to confirm the diagnosis and to be sure there are no other problems.  °TREATMENT  °· Initially an impaction can be removed manually. Using a gloved finger, your health care provider can remove hard stool from your rectum. °· Medicine is sometimes needed. A suppository or enema can be given in the rectum to soften the stool, which can stimulate a bowel movement. Medicines can also be given by mouth (orally). °· Though rare, surgery may be needed if the colon has torn (perforated) due to blockage. °HOME CARE INSTRUCTIONS   °· Develop regular bowel habits. This could include getting in the habit of having a bowel movement after your morning cup of coffee or after eating. Be sure to allow yourself enough time on the toilet. °· Maintain a high-fiber diet. °· Drink enough fluids to keep your urine clear or pale yellow as directed by your health care provider. °· Exercise regularly. °· If you begin to get constipated, increase the amount of fiber in your diet. Eat plenty of fruits, vegetables, whole wheat breads, bran, oatmeal, and similar products. °· Take natural fiber laxatives or other laxatives only as directed by your health care provider. °SEEK MEDICAL CARE IF:  °· You have ongoing rectal pain. °· You require enemas or suppositories more than twice a week. °· You have rectal bleeding. °· You have continued problems, or you develop abdominal pain. °· You have thin, pencil-like stools. °SEEK IMMEDIATE MEDICAL CARE IF:  °You have black or tarry stools. °MAKE SURE YOU:  °· Understand these instructions. °· Will watch your condition. °· Will get help right away if you are not doing well or get worse. °Document Released: 01/19/2004 Document Revised: 02/16/2013 Document Reviewed: 11/02/2012 °ExitCare® Patient Information ©2015 [employer]. This information is not intended to replace advice given to you by your health care provider. Make sure you discuss any questions you have with your health care provider. ° °

## 2013-12-21 NOTE — ED Provider Notes (Signed)
Pt with multiple CT scans recently, but on repeat exam, she reports this pain "feels different" and she is focally tender to LLQ  Sharyon Cable, MD 12/21/13 1539

## 2013-12-22 DIAGNOSIS — J449 Chronic obstructive pulmonary disease, unspecified: Secondary | ICD-10-CM | POA: Diagnosis not present

## 2013-12-22 DIAGNOSIS — I10 Essential (primary) hypertension: Secondary | ICD-10-CM | POA: Diagnosis not present

## 2013-12-22 DIAGNOSIS — F411 Generalized anxiety disorder: Secondary | ICD-10-CM | POA: Diagnosis not present

## 2013-12-22 DIAGNOSIS — N390 Urinary tract infection, site not specified: Secondary | ICD-10-CM | POA: Diagnosis not present

## 2013-12-22 DIAGNOSIS — R262 Difficulty in walking, not elsewhere classified: Secondary | ICD-10-CM | POA: Diagnosis not present

## 2013-12-22 LAB — CLOSTRIDIUM DIFFICILE BY PCR: Toxigenic C. Difficile by PCR: NEGATIVE

## 2013-12-23 DIAGNOSIS — J449 Chronic obstructive pulmonary disease, unspecified: Secondary | ICD-10-CM | POA: Diagnosis not present

## 2013-12-23 DIAGNOSIS — F411 Generalized anxiety disorder: Secondary | ICD-10-CM | POA: Diagnosis not present

## 2013-12-23 DIAGNOSIS — I10 Essential (primary) hypertension: Secondary | ICD-10-CM | POA: Diagnosis not present

## 2013-12-23 DIAGNOSIS — N390 Urinary tract infection, site not specified: Secondary | ICD-10-CM | POA: Diagnosis not present

## 2013-12-23 DIAGNOSIS — R262 Difficulty in walking, not elsewhere classified: Secondary | ICD-10-CM | POA: Diagnosis not present

## 2013-12-26 DIAGNOSIS — N390 Urinary tract infection, site not specified: Secondary | ICD-10-CM | POA: Diagnosis not present

## 2013-12-26 DIAGNOSIS — R262 Difficulty in walking, not elsewhere classified: Secondary | ICD-10-CM | POA: Diagnosis not present

## 2013-12-26 DIAGNOSIS — J449 Chronic obstructive pulmonary disease, unspecified: Secondary | ICD-10-CM | POA: Diagnosis not present

## 2013-12-26 DIAGNOSIS — F411 Generalized anxiety disorder: Secondary | ICD-10-CM | POA: Diagnosis not present

## 2013-12-26 DIAGNOSIS — I10 Essential (primary) hypertension: Secondary | ICD-10-CM | POA: Diagnosis not present

## 2013-12-27 DIAGNOSIS — F411 Generalized anxiety disorder: Secondary | ICD-10-CM | POA: Diagnosis not present

## 2013-12-27 DIAGNOSIS — I10 Essential (primary) hypertension: Secondary | ICD-10-CM | POA: Diagnosis not present

## 2013-12-27 DIAGNOSIS — J449 Chronic obstructive pulmonary disease, unspecified: Secondary | ICD-10-CM | POA: Diagnosis not present

## 2013-12-27 DIAGNOSIS — R262 Difficulty in walking, not elsewhere classified: Secondary | ICD-10-CM | POA: Diagnosis not present

## 2013-12-27 DIAGNOSIS — N390 Urinary tract infection, site not specified: Secondary | ICD-10-CM | POA: Diagnosis not present

## 2013-12-28 ENCOUNTER — Telehealth: Payer: Self-pay | Admitting: Internal Medicine

## 2013-12-28 MED ORDER — OMEPRAZOLE 20 MG PO CPDR: 20.0000 mg | DELAYED_RELEASE_CAPSULE | Freq: Every day | ORAL | Status: DC

## 2013-12-28 NOTE — Telephone Encounter (Signed)
[id number] - , [place] [address] is requesting 90 day re-fill on omeprazole (PRILOSEC) 20 MG capsule

## 2013-12-28 NOTE — Telephone Encounter (Signed)
rx sent in electronically 

## 2013-12-29 ENCOUNTER — Encounter (INDEPENDENT_AMBULATORY_CARE_PROVIDER_SITE_OTHER): Payer: Self-pay | Admitting: Surgery

## 2013-12-29 ENCOUNTER — Ambulatory Visit (INDEPENDENT_AMBULATORY_CARE_PROVIDER_SITE_OTHER): Payer: Medicare Other | Admitting: Surgery

## 2013-12-29 VITALS — BP 116/80 | HR 73 | Temp 97.1°F | Ht 61.0 in | Wt 110.0 lb

## 2013-12-29 DIAGNOSIS — Z853 Personal history of malignant neoplasm of breast: Secondary | ICD-10-CM

## 2013-12-29 DIAGNOSIS — C4359 Malignant melanoma of other part of trunk: Secondary | ICD-10-CM | POA: Diagnosis not present

## 2013-12-29 DIAGNOSIS — I2510 Atherosclerotic heart disease of native coronary artery without angina pectoris: Secondary | ICD-10-CM

## 2013-12-29 NOTE — Progress Notes (Signed)
[place], MD,  [address]  [place], [place]    [place] Phone:  [phone] FAX:  [phone]   Re:   [name] [name] DOB:   06-13-1923 MRN:   [id number]  ASSESSMENT AND PLAN: 1.  . Right breast cancer. T1b, N0.   Right lumpectomy and Right SLNBx - 07/21/2007.  Disease free and doing well at 4 1/2 years from her surgery.  Though she has some pain in her right breast which is chronic.   Disease free.  I gave her one of the 5 year survivor pins.  We talked and normally I would see patients back every year after 5 years, but she is older, had an early breast cancer, has significant pulmonary disease - so I made this her last visit.  2. Right subclavicular melanoma, T1a N0  Breslow's level - 0.5 mm  Wide excision showed some in situ melanoma, margins clear - 05/28/2012.  She thought the melanoma was on the left side, but her scar is on the right.  She is followed by Dr. Allyson Sabal.  3. History of gall stones. Continue to observe.   She takes Prevacid at night. 4. On home O2 at night.  5. COPD.   Sees Dr. Tia Masker.  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   She has had several bouts of pneumonia over the last year.    6. History of left thoracotomy where she still has pain issues. 7.  Hard of hearing  HISTORY OF PRESENT ILLNESS: Chief Complaint  Patient presents with  . Breast Cancer Long Term Follow Up   [name] [name] is a 80 y.o. (DOB: March 29, 1924)  white female who is a patient of HUNTER, STEPHEN, MD and comes to me today for follow up of a right subclavian biopsy which proved to be melanoma.  Wider excision did well.  The margins are clear. Daughter with patient.   She was worried about some nodules under her left clavicle.  She mistakenly thought that was the side of her melanoma, but it was under right clavicle. She has no breast concerns.  She is hard of hearing and clearly getting  older than when I last saw her.  Breast history: She had a right breast carcinoma which was 6 mm invasive ductal carcinoma. This was ER and PR receptor negative, Ki67 was 14%, and HER-2/neu negative. Her surgery was in July 21, 2007 and was a right breast lumpectomy and right axillary sentinel lymph node biopsy.  She has some lumpiness in the right breast.  Though to me, the breast is not much different from the left side, just sorer.    Social history: Daughter is with her.   Her daughter  works with the [employer] group.  PHYSICAL EXAM: BP 116/80  Pulse 73  Temp(Src) 97.1 F (36.2 C)  Ht $R'5\' 1"'vA$  (1.549 m)  Wt 110 lb (49.896 kg)  BMI 20.80 kg/m2  HEENT:  Pupils equal.  Dentition good. NECK:  Supple.  No thyroid mass. LYMPH NODES:  No cervical, supraclavicular, or axillary adenopathy. CHEST:   Scar under right clavicle okay BREASTS -  RIGHT:  Scar at 12 o'clock.  No palpable mass or nodule.  No nipple discharge.   LEFT:  No palpable mass or nodule.  No nipple discharge. UPPER EXTREMITIES:  No evidence of lymphedema.  DATA REVIEWED: Mammogram - [facility] - 12/06/2013 - neg  Alphonsa Overall, MD, FACS  Office:  [phone]

## 2013-12-30 DIAGNOSIS — N390 Urinary tract infection, site not specified: Secondary | ICD-10-CM | POA: Diagnosis not present

## 2013-12-30 DIAGNOSIS — J449 Chronic obstructive pulmonary disease, unspecified: Secondary | ICD-10-CM | POA: Diagnosis not present

## 2013-12-30 DIAGNOSIS — F411 Generalized anxiety disorder: Secondary | ICD-10-CM | POA: Diagnosis not present

## 2013-12-30 DIAGNOSIS — R262 Difficulty in walking, not elsewhere classified: Secondary | ICD-10-CM | POA: Diagnosis not present

## 2013-12-30 DIAGNOSIS — I10 Essential (primary) hypertension: Secondary | ICD-10-CM | POA: Diagnosis not present

## 2014-01-01 NOTE — Progress Notes (Signed)
HPI [name] [name] returns today for evaluation and management of her  coronary artery disease, history of mitral valve prolapse, and history of hypertension.  Patient was previously followed by T Wall  Last seen in July 2014  Seen by Tia Masker.  In July  Treated for sinus  Still not back to normal  Has some cough   Seen by Dr Lucia Gaskins in Aug  No follow up need Followed by Dr Yong Channel.   Still with some constipation  Seen in ER for impaction.    Allergies  Allergen Reactions  . Azithromycin Hives, Diarrhea, Dermatitis and Rash  . Ciprofloxacin Swelling  . Levaquin [Levofloxacin] Hives  . Penicillins Hives    has had mild doses that she did not have reactions to  . Prednisone     Unknown   . Sulfamethoxazole Nausea And Vomiting    Current Outpatient Prescriptions  Medication Sig Dispense Refill  . albuterol (PROVENTIL) (2.5 MG/3ML) 0.083% nebulizer solution Take 3 mLs (2.5 mg total) by nebulization every 6 (six) hours as needed for wheezing or shortness of breath.      Marland Kitchen amLODipine (NORVASC) 2.5 MG tablet Take 2.5 mg by mouth daily.      Marland Kitchen amoxicillin-clavulanate (AUGMENTIN) 500-125 MG per tablet Take 1 tablet by mouth 3 (three) times daily. She is to take for 7 days. Her pharmacy filled this for her on 12/15/13. She has not completed this regimen yet.      Marland Kitchen aspirin 325 MG tablet Take 325 mg by mouth daily.      . budesonide-formoterol (SYMBICORT) 160-4.5 MCG/ACT inhaler Inhale 2 puffs into the lungs 2 (two) times daily.      . diazepam (VALIUM) 5 MG tablet Take 2.5-5 mg by mouth at bedtime. For anxiety      . fluticasone (FLONASE) 50 MCG/ACT nasal spray Place 2 sprays into both nostrils daily.      Marland Kitchen losartan (COZAAR) 50 MG tablet Take 50 mg by mouth daily.      . nitroGLYCERIN (NITROSTAT) 0.4 MG SL tablet Place 0.4 mg under the tongue every 5 (five) minutes as needed for chest pain.      Marland Kitchen omeprazole (PRILOSEC) 20 MG capsule Take 1 capsule (20 mg total) by mouth daily.  90 capsule  1  .  polyethylene glycol (MIRALAX / GLYCOLAX) packet Take 17 g by mouth daily as needed. For constipation      . predniSONE (DELTASONE) 20 MG tablet Take 20 mg by mouth daily with breakfast. She is to take for 14 days. Her pharmacy filled this for her on 12/15/13. She has not completed the regimen yet.      . senna (SENOKOT) 8.6 MG TABS tablet Take 1 tablet by mouth daily as needed. For constipation       No current facility-administered medications for this visit.    Past Medical History  Diagnosis Date  . ANXIETY 08/16/2008  . CAD, UNSPECIFIED SITE 10/17/2008  . CHOLELITHIASIS 08/23/2008  . COPD 01/04/2010    FeV1 101%, DLCO64% 2008  . 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 DIVERTICULOSIS-COLON 04/07/2008  . GERD 04/19/2007  . HYPERSOMNIA, ASSOCIATED WITH SLEEP APNEA 02/01/2007  . HYPERTENSION 11/12/2006  . MITRAL VALVE PROLAPSE, HX OF 10/07/2008  . NEOPLASM, MALIGNANT, BREAST, RIGHT 02/15/2008  . OSTEOPOROSIS 04/04/2008  . Raynaud's syndrome 04/25/2009  . Airway obstruction   . History of diverticulitis of colon   . Tortuous colon   . Anal stricture   . Chronic constipation   .  Fatty liver   . Hiatal hernia   . Esophageal stricture   . IBS (irritable bowel syndrome)   . Pulmonary fibrosis   . Sleep apnea   . COPD (chronic obstructive pulmonary disease) 01/04/2010  . Colitis     sigmoid  . Candida esophagitis   . Diverticulitis   . Hearing aid worn     bilateral  . Atrial fibrillation     she denies known hx of afib 02/24/13  . Complication of anesthesia     difficult intubation    Past Surgical History  Procedure Laterality Date  . Abdominal hysterectomy    . Bilateral salpingoophorectomy      s/p prolapsed bladder  . Bladder surgery      prolapsed  . Thoracotomy      granuloma, pulmonary-thoracotomy  . Breast surgery  07-21-07    mastectomy (right), all margins neg and LNs neg   . Appendectomy    . Tonsillectomy    . Rotator cuff repair    . Mastectomy       right  . Breast cyst excision      left  . Mouth surgery    . Eye surgery      cataract removal bilateral  . Melanoma excision      rt clavicle Dr Rhoderick [facility] office)  . Lung surgery      Family History  Problem Relation Age of Onset  . Parkinsonism Father   . Coronary artery disease Father   . Breast cancer      aunt  . Colon cancer      aunt, age 78  . Uterine cancer      aunt  . Stroke Mother   . Heart failure Mother   . Breast cancer Maternal Aunt   . Colon cancer Maternal Aunt   . Uterine cancer Maternal Aunt     History   Social History  . Marital Status: Widowed    Spouse Name: N/A    Number of Children: 3  . Years of Education: N/A   Occupational History  . retireed    Social History Main Topics  . Smoking status: Former Smoker -- 2.00 packs/day for 30 years    Types: Cigarettes    Quit date: 05/13/1983  . Smokeless tobacco: Never Used  . Alcohol Use: Yes     Comment: 1-2 glaases wine per day  . Drug Use: No  . Sexual Activity: No   Other Topics Concern  . Not on file   Social History Narrative   Living in assisted living    Review of Systems:  All systems reviewed.  They are negative to the above problem except as previously stated.  Vital Signs: BP 116/64  Pulse 62  Ht 5\' 4"  (1.626 m)  Wt 110 lb (49.896 kg)  BMI 18.87 kg/m2  Physical Exam patinet is in NAD   HEENT:  Normocephalic, atraumatic. EOMI, PERRLA.  Neck: JVP is normal.  No bruits.  Lungs: clear to auscultation. No rales no wheezes.  Heart: Regular rate and rhythm. Normal S1, S2. No S3.   No significant murmurs. PMI not displaced.  Abdomen:  Supple, nontender. Normal bowel sounds. No masses. No hepatomegaly.  Extremities:   Good distal pulses throughout. No lower extremity edema.  Musculoskeletal :moving all extremities.  Neuro:   alert and oriented x3.  CN II-XII grossly intact.  EKG  SR  62 bpm    First degree AV block  PR 210 msec.  Occaisonal  PAC Assessment and  Plan:  1.  CAD  Will need to review old records.  No symptoms of angina  Keep on regimen  2.  MVP  No murmur 3.  HTN  Good control  No dizziness 4.  HL  Good control of LDL  HDL low at 25  Stay active 5.  Pulm  Improving  Has f/u in Sept 6.  Constip  Recomm miralax 1/2 glass (1 is tough for her)  Consider MOM  F/U next July

## 2014-01-02 ENCOUNTER — Ambulatory Visit (INDEPENDENT_AMBULATORY_CARE_PROVIDER_SITE_OTHER): Payer: Medicare Other | Admitting: Internal Medicine

## 2014-01-02 VITALS — BP 116/64 | HR 62 | Ht 64.0 in | Wt 110.0 lb

## 2014-01-02 DIAGNOSIS — R262 Difficulty in walking, not elsewhere classified: Secondary | ICD-10-CM | POA: Diagnosis not present

## 2014-01-02 DIAGNOSIS — I2510 Atherosclerotic heart disease of native coronary artery without angina pectoris: Secondary | ICD-10-CM

## 2014-01-02 DIAGNOSIS — F411 Generalized anxiety disorder: Secondary | ICD-10-CM | POA: Diagnosis not present

## 2014-01-02 DIAGNOSIS — J449 Chronic obstructive pulmonary disease, unspecified: Secondary | ICD-10-CM | POA: Diagnosis not present

## 2014-01-02 DIAGNOSIS — I10 Essential (primary) hypertension: Secondary | ICD-10-CM

## 2014-01-02 DIAGNOSIS — N390 Urinary tract infection, site not specified: Secondary | ICD-10-CM | POA: Diagnosis not present

## 2014-01-02 NOTE — Patient Instructions (Signed)
Your physician wants you to follow-up in: July 2016 with Dr Harrington Challenger.  You will receive a reminder letter in the mail two months in advance. If you don't receive a letter, please call our office to schedule the follow-up appointment.  Your physician recommends that you continue on your current medications as directed. Please refer to the Current Medication list given to you today.

## 2014-01-03 ENCOUNTER — Ambulatory Visit: Payer: Medicare Other | Admitting: Family Medicine

## 2014-01-04 DIAGNOSIS — J449 Chronic obstructive pulmonary disease, unspecified: Secondary | ICD-10-CM | POA: Diagnosis not present

## 2014-01-04 DIAGNOSIS — F411 Generalized anxiety disorder: Secondary | ICD-10-CM | POA: Diagnosis not present

## 2014-01-04 DIAGNOSIS — R262 Difficulty in walking, not elsewhere classified: Secondary | ICD-10-CM | POA: Diagnosis not present

## 2014-01-04 DIAGNOSIS — N390 Urinary tract infection, site not specified: Secondary | ICD-10-CM | POA: Diagnosis not present

## 2014-01-04 DIAGNOSIS — I10 Essential (primary) hypertension: Secondary | ICD-10-CM | POA: Diagnosis not present

## 2014-01-06 DIAGNOSIS — R262 Difficulty in walking, not elsewhere classified: Secondary | ICD-10-CM | POA: Diagnosis not present

## 2014-01-06 DIAGNOSIS — F411 Generalized anxiety disorder: Secondary | ICD-10-CM | POA: Diagnosis not present

## 2014-01-06 DIAGNOSIS — N390 Urinary tract infection, site not specified: Secondary | ICD-10-CM | POA: Diagnosis not present

## 2014-01-06 DIAGNOSIS — I10 Essential (primary) hypertension: Secondary | ICD-10-CM | POA: Diagnosis not present

## 2014-01-06 DIAGNOSIS — J449 Chronic obstructive pulmonary disease, unspecified: Secondary | ICD-10-CM | POA: Diagnosis not present

## 2014-01-09 DIAGNOSIS — R262 Difficulty in walking, not elsewhere classified: Secondary | ICD-10-CM | POA: Diagnosis not present

## 2014-01-09 DIAGNOSIS — I10 Essential (primary) hypertension: Secondary | ICD-10-CM | POA: Diagnosis not present

## 2014-01-09 DIAGNOSIS — F411 Generalized anxiety disorder: Secondary | ICD-10-CM | POA: Diagnosis not present

## 2014-01-09 DIAGNOSIS — N390 Urinary tract infection, site not specified: Secondary | ICD-10-CM | POA: Diagnosis not present

## 2014-01-09 DIAGNOSIS — J449 Chronic obstructive pulmonary disease, unspecified: Secondary | ICD-10-CM | POA: Diagnosis not present

## 2014-01-10 ENCOUNTER — Other Ambulatory Visit (INDEPENDENT_AMBULATORY_CARE_PROVIDER_SITE_OTHER): Payer: Medicare Other

## 2014-01-10 DIAGNOSIS — N390 Urinary tract infection, site not specified: Secondary | ICD-10-CM

## 2014-01-10 LAB — POCT URINALYSIS DIPSTICK
Bilirubin, UA: NEGATIVE
Glucose, UA: NEGATIVE
Ketones, UA: NEGATIVE
Nitrite, UA: POSITIVE
PROTEIN UA: NEGATIVE
Spec Grav, UA: 1.015
Urobilinogen, UA: 0.2
pH, UA: 7

## 2014-01-11 ENCOUNTER — Telehealth: Payer: Self-pay

## 2014-01-11 ENCOUNTER — Telehealth: Payer: Self-pay | Admitting: Family Medicine

## 2014-01-11 MED ORDER — CEPHALEXIN 500 MG PO CAPS: 500.0000 mg | ORAL_CAPSULE | Freq: Three times a day (TID) | ORAL | Status: DC

## 2014-01-11 NOTE — Telephone Encounter (Signed)
Pt states she is having burning when she urinates x 2days and she knows that she has an UTI. (see UA under labs, i accidentally closed that encounter)

## 2014-01-11 NOTE — Telephone Encounter (Signed)
Pt.notified

## 2014-01-11 NOTE — Telephone Encounter (Signed)
Rx for keflex sent in. Awaiting culture. If patient were to have hives she needs to seek care. She needs to be seen if symptoms do not resolve on antibiotics or if develops fevers/chills/back pain/worsening symptoms

## 2014-01-11 NOTE — Telephone Encounter (Addendum)
Pt would results of labs.  Urine. looks like you may have called pt.

## 2014-01-12 DIAGNOSIS — N390 Urinary tract infection, site not specified: Secondary | ICD-10-CM | POA: Diagnosis not present

## 2014-01-12 DIAGNOSIS — I10 Essential (primary) hypertension: Secondary | ICD-10-CM | POA: Diagnosis not present

## 2014-01-12 DIAGNOSIS — R262 Difficulty in walking, not elsewhere classified: Secondary | ICD-10-CM | POA: Diagnosis not present

## 2014-01-12 DIAGNOSIS — J449 Chronic obstructive pulmonary disease, unspecified: Secondary | ICD-10-CM | POA: Diagnosis not present

## 2014-01-12 DIAGNOSIS — F411 Generalized anxiety disorder: Secondary | ICD-10-CM | POA: Diagnosis not present

## 2014-01-13 DIAGNOSIS — R262 Difficulty in walking, not elsewhere classified: Secondary | ICD-10-CM | POA: Diagnosis not present

## 2014-01-13 DIAGNOSIS — N390 Urinary tract infection, site not specified: Secondary | ICD-10-CM | POA: Diagnosis not present

## 2014-01-13 DIAGNOSIS — J449 Chronic obstructive pulmonary disease, unspecified: Secondary | ICD-10-CM | POA: Diagnosis not present

## 2014-01-13 DIAGNOSIS — I10 Essential (primary) hypertension: Secondary | ICD-10-CM | POA: Diagnosis not present

## 2014-01-13 DIAGNOSIS — F411 Generalized anxiety disorder: Secondary | ICD-10-CM | POA: Diagnosis not present

## 2014-01-13 LAB — URINE CULTURE: Colony Count: >=100000

## 2014-01-17 DIAGNOSIS — R262 Difficulty in walking, not elsewhere classified: Secondary | ICD-10-CM | POA: Diagnosis not present

## 2014-01-17 DIAGNOSIS — N390 Urinary tract infection, site not specified: Secondary | ICD-10-CM | POA: Diagnosis not present

## 2014-01-17 DIAGNOSIS — F411 Generalized anxiety disorder: Secondary | ICD-10-CM | POA: Diagnosis not present

## 2014-01-17 DIAGNOSIS — J449 Chronic obstructive pulmonary disease, unspecified: Secondary | ICD-10-CM | POA: Diagnosis not present

## 2014-01-17 DIAGNOSIS — I10 Essential (primary) hypertension: Secondary | ICD-10-CM | POA: Diagnosis not present

## 2014-01-18 DIAGNOSIS — B351 Tinea unguium: Secondary | ICD-10-CM | POA: Diagnosis not present

## 2014-01-18 DIAGNOSIS — Z85820 Personal history of malignant melanoma of skin: Secondary | ICD-10-CM | POA: Diagnosis not present

## 2014-01-18 DIAGNOSIS — D2360 Other benign neoplasm of skin of unspecified upper limb, including shoulder: Secondary | ICD-10-CM | POA: Diagnosis not present

## 2014-01-19 DIAGNOSIS — F411 Generalized anxiety disorder: Secondary | ICD-10-CM | POA: Diagnosis not present

## 2014-01-19 DIAGNOSIS — J449 Chronic obstructive pulmonary disease, unspecified: Secondary | ICD-10-CM | POA: Diagnosis not present

## 2014-01-19 DIAGNOSIS — R262 Difficulty in walking, not elsewhere classified: Secondary | ICD-10-CM | POA: Diagnosis not present

## 2014-01-19 DIAGNOSIS — N390 Urinary tract infection, site not specified: Secondary | ICD-10-CM | POA: Diagnosis not present

## 2014-01-19 DIAGNOSIS — I10 Essential (primary) hypertension: Secondary | ICD-10-CM | POA: Diagnosis not present

## 2014-01-20 DIAGNOSIS — N390 Urinary tract infection, site not specified: Secondary | ICD-10-CM | POA: Diagnosis not present

## 2014-01-20 DIAGNOSIS — F411 Generalized anxiety disorder: Secondary | ICD-10-CM | POA: Diagnosis not present

## 2014-01-20 DIAGNOSIS — I10 Essential (primary) hypertension: Secondary | ICD-10-CM | POA: Diagnosis not present

## 2014-01-20 DIAGNOSIS — R262 Difficulty in walking, not elsewhere classified: Secondary | ICD-10-CM | POA: Diagnosis not present

## 2014-01-20 DIAGNOSIS — J449 Chronic obstructive pulmonary disease, unspecified: Secondary | ICD-10-CM | POA: Diagnosis not present

## 2014-01-26 ENCOUNTER — Other Ambulatory Visit: Payer: Self-pay | Admitting: Internal Medicine

## 2014-01-26 NOTE — Telephone Encounter (Signed)
Is this ok to refill?  

## 2014-01-27 DIAGNOSIS — N390 Urinary tract infection, site not specified: Secondary | ICD-10-CM | POA: Diagnosis not present

## 2014-01-27 DIAGNOSIS — I10 Essential (primary) hypertension: Secondary | ICD-10-CM | POA: Diagnosis not present

## 2014-01-27 DIAGNOSIS — J449 Chronic obstructive pulmonary disease, unspecified: Secondary | ICD-10-CM | POA: Diagnosis not present

## 2014-01-27 DIAGNOSIS — F411 Generalized anxiety disorder: Secondary | ICD-10-CM | POA: Diagnosis not present

## 2014-01-27 DIAGNOSIS — R262 Difficulty in walking, not elsewhere classified: Secondary | ICD-10-CM | POA: Diagnosis not present

## 2014-01-27 NOTE — Telephone Encounter (Signed)
Medication called in. Pharmacist said Valium dosent come in 2.5mg  so pt will have to take half of the 5mg  tablet before bed.

## 2014-02-03 ENCOUNTER — Ambulatory Visit: Payer: Medicare Other | Admitting: Critical Care Medicine

## 2014-02-13 ENCOUNTER — Ambulatory Visit (INDEPENDENT_AMBULATORY_CARE_PROVIDER_SITE_OTHER): Payer: Medicare Other | Admitting: Critical Care Medicine

## 2014-02-13 ENCOUNTER — Encounter: Payer: Self-pay | Admitting: Critical Care Medicine

## 2014-02-13 VITALS — BP 118/66 | HR 61 | Temp 97.0°F | Ht 61.0 in | Wt 116.8 lb

## 2014-02-13 DIAGNOSIS — Z23 Encounter for immunization: Secondary | ICD-10-CM

## 2014-02-13 DIAGNOSIS — J439 Emphysema, unspecified: Secondary | ICD-10-CM

## 2014-02-13 DIAGNOSIS — I2510 Atherosclerotic heart disease of native coronary artery without angina pectoris: Secondary | ICD-10-CM | POA: Diagnosis not present

## 2014-02-13 MED ORDER — DOXYCYCLINE HYCLATE 100 MG PO TABS: 100.0000 mg | ORAL_TABLET | Freq: Two times a day (BID) | ORAL | Status: DC

## 2014-02-13 NOTE — Patient Instructions (Addendum)
Flu vaccine today Return 1 week for prevnar 13 vaccine Return 4 months Doxycyclene 100mg  twice a day for 7days if you become ill on the cruise, [facility]

## 2014-02-13 NOTE — Progress Notes (Signed)
Subjective:    Patient ID: [name] [name], female    DOB: 01/12/1924, 80 y.o.   MRN: [id number]  HPI  This is a 80 y.o. 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 The pt also has hx of upper airway obstruction with difficult intubation with prior operative procedures. Also hx Breast CA s/p resection and XRT. Hx Gallstone disease with conservative Rx due to difficult intubation status.   02/13/2014 Chief Complaint  Patient presents with  . 2 month follow up    Feels breathing is doing well overall.  Fatigues with walking.  Runny nose.    Fatigues with walking.  No real mucus now.  Oxygen night only.  No chest pain. Bladder issues persist Planning a cruise destination. Pt denies any significant sore throat, nasal congestion or excess secretions, fever, chills, sweats, unintended weight loss, pleurtic or exertional chest pain, orthopnea PND, or leg swelling Pt denies any increase in rescue therapy over baseline, denies waking up needing it or having any early am or nocturnal exacerbations of coughing/wheezing/or dyspnea. Pt also denies any obvious fluctuation in symptoms with  weather or environmental change or other alleviating or aggravating factors  Review of Systems Constitutional:   No  weight loss, night sweats,  Fevers, chills, fatigue, lassitude. HEENT:   No headaches,  Difficulty swallowing,  Tooth/dental problems,  Sore throat,                No sneezing, itching, ear ache, nasal congestion, post nasal drip,   CV:  No chest pain,  Orthopnea, PND, swelling in lower extremities, anasarca, dizziness, palpitations  GI  No heartburn, indigestion, abdominal pain, nausea, vomiting, diarrhea, change in bowel habits, loss of appetite  Resp: No shortness of breath with exertion or at rest.  No excess mucus, no productive cough,  No non-productive cough,  No coughing up of blood.  No change in color of mucus.  No wheezing.  No  chest wall deformity  Skin: no rash or lesions.  GU: no dysuria, change in color of urine, no urgency or frequency.  No flank pain.  MS:  No joint pain or swelling.  No decreased range of motion.  No back pain.  Psych:  No change in mood or affect. No depression or anxiety.  No memory loss.]     Objective:   Physical Exam BP 118/66  Pulse 61  Temp(Src) 97 F (36.1 C) (Oral)  Ht 5\' 1"  (1.549 m)  Wt 116 lb 12.8 oz (52.98 kg)  BMI 22.08 kg/m2  SpO2 100%  GEN: A/Ox3;  , NAD, elderly  HEENT:  /AT,  EACs-clear, TMs-wnl, NOSE-clear, THROAT-clear, no lesions, +postnasal drip also bilateral purulence seen   NECK:  Supple w/ fair ROM; no JVD; normal carotid impulses w/o bruits; no thyromegaly or nodules palpated; no lymphadenopathy.  RESP  Coarse BS w/ no wheezingno accessory muscle use,   CARD:  RRR, no m/r/g  , no peripheral edema, pulses intact, no cyanosis or clubbing.  Musco: Warm bil, no deformities or joint swelling noted.   Neuro: alert, no focal deficits noted.    Skin: Warm, no lesions rash noted over anterior trunk and thorax    No results found.     Assessment & Plan:   COPD with emphysema Gold B Gold stage B. COPD stable at this time Continue nocturnal oxygen therapy Continued inhaled medication with Symbicort Flu vaccine will be administered The patient return in one  week for Prevnar 13 vaccine The patient was given a seven-day course of doxycycline 100 mg twice daily to take with her on an upcoming cruise she will call us and let us know she has had to use that medication   Updated Medication List Outpatient Encounter Prescriptions as of 02/13/2014  Medication Sig  . albuterol (PROVENTIL) (2.5 MG/3ML) 0.083% nebulizer solution Take 3 mLs (2.5 mg total) by nebulization every 6 (six) hours as needed for wheezing or shortness of breath.  Marland Kitchen amLODipine (NORVASC) 2.5 MG tablet Take 2.5 mg by mouth daily.  Marland Kitchen aspirin 325 MG tablet Take 325 mg by mouth daily.   . budesonide-formoterol (SYMBICORT) 160-4.5 MCG/ACT inhaler Inhale 2 puffs into the lungs 2 (two) times daily.  . diazepam (VALIUM) 5 MG tablet TAKE 1 TABLET AT BEDTIME  . fluticasone (FLONASE) 50 MCG/ACT nasal spray Place 2 sprays into both nostrils daily.  Marland Kitchen losartan (COZAAR) 50 MG tablet Take 50 mg by mouth daily.  . nitroGLYCERIN (NITROSTAT) 0.4 MG SL tablet Place 0.4 mg under the tongue every 5 (five) minutes as needed for chest pain.  Marland Kitchen omeprazole (PRILOSEC) 20 MG capsule Take 1 capsule (20 mg total) by mouth daily.  . polyethylene glycol (MIRALAX / GLYCOLAX) packet Take 17 g by mouth daily as needed. For constipation  . senna (SENOKOT) 8.6 MG TABS tablet Take 1 tablet by mouth daily as needed. For constipation  . doxycycline (VIBRA-TABS) 100 MG tablet Take 1 tablet (100 mg total) by mouth 2 (two) times daily.  . [DISCONTINUED] amoxicillin-clavulanate (AUGMENTIN) 500-125 MG per tablet Take 1 tablet by mouth 3 (three) times daily. She is to take for 7 days. Her pharmacy filled this for her on 12/15/13. She has not completed this regimen yet.  . [DISCONTINUED] cephALEXin (KEFLEX) 500 MG capsule Take 1 capsule (500 mg total) by mouth 3 (three) times daily.  . [DISCONTINUED] predniSONE (DELTASONE) 20 MG tablet Take 20 mg by mouth daily with breakfast. She is to take for 14 days. Her pharmacy filled this for her on 12/15/13. She has not completed the regimen yet.

## 2014-02-13 NOTE — Assessment & Plan Note (Addendum)
Gold stage B. COPD stable at this time Continue nocturnal oxygen therapy Continued inhaled medication with Symbicort Flu vaccine will be administered The patient return in one week for Prevnar 13 vaccine The patient was given a seven-day course of doxycycline 100 mg twice daily to take with her on an upcoming cruise she will call us and let us know she has had to use that medication

## 2014-02-20 ENCOUNTER — Ambulatory Visit (INDEPENDENT_AMBULATORY_CARE_PROVIDER_SITE_OTHER): Payer: Medicare Other

## 2014-02-20 ENCOUNTER — Telehealth: Payer: Self-pay | Admitting: Critical Care Medicine

## 2014-02-20 ENCOUNTER — Telehealth: Payer: Self-pay | Admitting: Family Medicine

## 2014-02-20 DIAGNOSIS — M170 Bilateral primary osteoarthritis of knee: Secondary | ICD-10-CM | POA: Diagnosis not present

## 2014-02-20 DIAGNOSIS — M25562 Pain in left knee: Secondary | ICD-10-CM | POA: Diagnosis not present

## 2014-02-20 DIAGNOSIS — Z23 Encounter for immunization: Secondary | ICD-10-CM

## 2014-02-20 NOTE — Telephone Encounter (Signed)
Pt request refill diazepam (VALIUM) 5 MG tablet Only 15 tabs sent in last rx and pt takes 1/nite.  So now she is almost out. 15 not enough Pt going on a cruise Friday and needs this refilled. [facility] [place]

## 2014-02-20 NOTE — Telephone Encounter (Signed)
Keba, you may refill #30. Please tell patient after her cruise, my preference would strongly be for her to take 2.5 mg (half pill) due to her age-we want to minimize her dose as much as possible.

## 2014-02-20 NOTE — Telephone Encounter (Signed)
Is this ok to refill?  

## 2014-02-20 NOTE — Telephone Encounter (Signed)
Form completed. Signed by Dr. Joya Gaskins.  Copy placed in scan folder and original given to pt.  Nothing further needed.

## 2014-02-21 MED ORDER — DIAZEPAM 5 MG PO TABS: 5.0000 mg | ORAL_TABLET | Freq: Four times a day (QID) | ORAL | Status: DC | PRN

## 2014-02-21 NOTE — Telephone Encounter (Signed)
Medication called in 

## 2014-02-23 ENCOUNTER — Telehealth: Payer: Self-pay | Admitting: Family Medicine

## 2014-02-23 ENCOUNTER — Telehealth: Payer: Self-pay | Admitting: Critical Care Medicine

## 2014-02-23 NOTE — Telephone Encounter (Signed)
Dr. Hunter see below 

## 2014-02-23 NOTE — Telephone Encounter (Signed)
lmtcb for pt x1 

## 2014-02-23 NOTE — Telephone Encounter (Signed)
Pt is aware md will not call abx in and I  Inform pt  to go to urgent care. Pt stated she will just drink cranberry juice

## 2014-02-23 NOTE — Telephone Encounter (Signed)
Correction pt will be going on cruise tomorrow

## 2014-02-23 NOTE — Telephone Encounter (Signed)
Her pulmonologist called something in for her cruise at her visit on 10/5, not me. She should call him if she has a concern about what was prescribed.

## 2014-02-23 NOTE — Telephone Encounter (Signed)
Pt would like abx [facility] [facility] uti. Pt stated she can not come in today and will be going on cruise today.

## 2014-02-23 NOTE — Telephone Encounter (Signed)
LM on pt VM with details

## 2014-02-24 NOTE — Telephone Encounter (Signed)
LMTCB

## 2014-02-27 NOTE — Telephone Encounter (Signed)
Have attemtped to call the pt x 3. No answer but did leave a message and advised the pt to call back for any issues.

## 2014-03-21 ENCOUNTER — Ambulatory Visit: Payer: Medicare Other | Admitting: Family Medicine

## 2014-04-12 ENCOUNTER — Ambulatory Visit (INDEPENDENT_AMBULATORY_CARE_PROVIDER_SITE_OTHER): Payer: Medicare Other | Admitting: Family Medicine

## 2014-04-12 ENCOUNTER — Encounter: Payer: Self-pay | Admitting: Family Medicine

## 2014-04-12 VITALS — BP 130/72 | Temp 98.1°F | Wt 113.0 lb

## 2014-04-12 DIAGNOSIS — K5900 Constipation, unspecified: Secondary | ICD-10-CM | POA: Insufficient documentation

## 2014-04-12 DIAGNOSIS — M1710 Unilateral primary osteoarthritis, unspecified knee: Secondary | ICD-10-CM | POA: Insufficient documentation

## 2014-04-12 DIAGNOSIS — F0280 Dementia in other diseases classified elsewhere without behavioral disturbance: Secondary | ICD-10-CM | POA: Insufficient documentation

## 2014-04-12 DIAGNOSIS — R300 Dysuria: Secondary | ICD-10-CM | POA: Diagnosis not present

## 2014-04-12 DIAGNOSIS — M170 Bilateral primary osteoarthritis of knee: Secondary | ICD-10-CM | POA: Diagnosis not present

## 2014-04-12 DIAGNOSIS — G309 Alzheimer's disease, unspecified: Secondary | ICD-10-CM

## 2014-04-12 DIAGNOSIS — F419 Anxiety disorder, unspecified: Secondary | ICD-10-CM | POA: Diagnosis not present

## 2014-04-12 DIAGNOSIS — I10 Essential (primary) hypertension: Secondary | ICD-10-CM | POA: Diagnosis not present

## 2014-04-12 DIAGNOSIS — M179 Osteoarthritis of knee, unspecified: Secondary | ICD-10-CM | POA: Insufficient documentation

## 2014-04-12 DIAGNOSIS — N390 Urinary tract infection, site not specified: Secondary | ICD-10-CM

## 2014-04-12 DIAGNOSIS — J309 Allergic rhinitis, unspecified: Secondary | ICD-10-CM | POA: Insufficient documentation

## 2014-04-12 MED ORDER — CEPHALEXIN 500 MG PO TABS: 500.0000 mg | ORAL_TABLET | Freq: Three times a day (TID) | ORAL | Status: DC

## 2014-04-12 MED ORDER — DIAZEPAM 5 MG PO TABS: 5.0000 mg | ORAL_TABLET | Freq: Every evening | ORAL | Status: DC | PRN

## 2014-04-12 NOTE — Assessment & Plan Note (Signed)
She does not have obvious weakness on exam. She does have crepitus. She doesn't move a lot because of her knee pain and her legs have become weaker. We will trial physical therapy to see if this helps patient.

## 2014-04-12 NOTE — Patient Instructions (Addendum)
Please return sometime after the holiday's so we can discuss evaluation of memory loss.   Take antibiotic for 7 days. We will send urine for culture. See [place] if symptoms persist after course of antibiotics.   Referred you to home health physical therapy to help with your lower leg weakness.

## 2014-04-12 NOTE — Assessment & Plan Note (Signed)
Symptomatic today. Treat with Keflex 7 days. Check culture.

## 2014-04-12 NOTE — Assessment & Plan Note (Signed)
Continue amlodipine 2.5 mg and losartan 50 mg as good control.

## 2014-04-12 NOTE — Progress Notes (Signed)
[name] Reddish, MD Phone: [phone]  Subjective:  Patient presents today to establish care with me as their new primary care provider. Patient was formerly a patient of Dr. Leanne Chang. Chief complaint-noted.   Dysuria- new recurrence of chronic issue Been taking doxycycline she had left over for 3 days Symptoms started 2-3 days ago. Dysuria. No polyuria. Urgency.  ROS-no fever/chills. No CVA tenderness.   OA/leg weakness- worsening Patient states that she does not think her strength is good as it used to be. She is fatigued walking down the hall but she states there is no pain. sHe states that her knees hurt when she walks. She does not want to trial injection at this time. She's had some success with physical therapy in the past improving strength. ROS-denies giving way or leg pain.  Hypertension-good control  BP Readings from Last 3 Encounters:  04/12/14 130/72  02/13/14 118/66  01/02/14 116/64   Home BP monitoring-no Compliant with medications-yes without side effects ROS-Denies any CP, HA, SOB, blurry vision.   The following were reviewed and entered/updated in epic: Past Medical History  Diagnosis Date  . ANXIETY 08/16/2008  . CAD, UNSPECIFIED SITE 10/17/2008  . CHOLELITHIASIS 08/23/2008  . COPD 01/04/2010    FeV1 101%, DLCO64% 2008  . 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 DIVERTICULOSIS-COLON 04/07/2008  . GERD 04/19/2007  . HYPERSOMNIA, ASSOCIATED WITH SLEEP APNEA 02/01/2007  . HYPERTENSION 11/12/2006  . MITRAL VALVE PROLAPSE, HX OF 10/07/2008  . NEOPLASM, MALIGNANT, BREAST, RIGHT 02/15/2008  . OSTEOPOROSIS 04/04/2008  . Raynaud's syndrome 04/25/2009  . Airway obstruction   . History of diverticulitis of colon   . Tortuous colon   . Anal stricture   . Chronic constipation   . Fatty liver   . Hiatal hernia   . Esophageal stricture   . IBS (irritable bowel syndrome)   . Pulmonary fibrosis   . Sleep apnea   . COPD (chronic obstructive pulmonary disease)  01/04/2010  . Colitis     sigmoid  . Candida esophagitis   . Diverticulitis   . Hearing aid worn     bilateral  . Atrial fibrillation     she denies known hx of afib 02/24/13  . Complication of anesthesia     difficult intubation   Patient Active Problem List   Diagnosis Date Noted  . Anxiety 11/21/2013    Priority: High  . History of CVA (cerebrovascular accident) 07/29/2013    Priority: High  . Pulmonary nodules 07/29/2013    Priority: High  . COPD with emphysema Gold B 01/04/2010    Priority: High  . Recurrent UTI 07/13/2013    Priority: Medium  . History of breast cancer, T1b, N0, Lumpectomy 07/21/2007. 04/21/2011    Priority: Medium  . Essential hypertension 11/12/2006    Priority: Medium  . Allergic rhinitis 04/12/2014    Priority: Low  . Constipation 04/12/2014    Priority: Low  . Raynaud's syndrome 04/25/2009    Priority: Low  . GERD 04/19/2007    Priority: Low  . Memory loss 04/12/2014  . Osteoarthritis, knee 04/12/2014   Past Surgical History  Procedure Laterality Date  . Abdominal hysterectomy    . Bilateral salpingoophorectomy      s/p prolapsed bladder  . Bladder surgery      prolapsed  . Thoracotomy      granuloma, pulmonary-thoracotomy  . Breast surgery  07-21-07    mastectomy (right), all margins neg and LNs neg   . Appendectomy    .  Tonsillectomy    . Rotator cuff repair    . Mastectomy      right  . Breast cyst excision      left  . Mouth surgery    . Eye surgery      cataract removal bilateral  . Melanoma excision      rt clavicle Dr Rhoderick [facility] office)  . Lung surgery      Family History  Problem Relation Age of Onset  . Parkinsonism Father   . Coronary artery disease Father   . Breast cancer      aunt  . Colon cancer      aunt, age 20  . Uterine cancer      aunt  . Stroke Mother   . Heart failure Mother   . Breast cancer Maternal Aunt   . Colon cancer Maternal Aunt   . Uterine cancer Maternal Aunt     Medications-  reviewed and updated Current Outpatient Prescriptions  Medication Sig Dispense Refill  . amLODipine (NORVASC) 2.5 MG tablet Take 2.5 mg by mouth daily.    Marland Kitchen aspirin 325 MG tablet Take 325 mg by mouth daily.    . budesonide-formoterol (SYMBICORT) 160-4.5 MCG/ACT inhaler Inhale 2 puffs into the lungs 2 (two) times daily.    Marland Kitchen losartan (COZAAR) 50 MG tablet Take 50 mg by mouth daily.    Marland Kitchen omeprazole (PRILOSEC) 20 MG capsule Take 1 capsule (20 mg total) by mouth daily. 90 capsule 1  . albuterol (PROVENTIL) (2.5 MG/3ML) 0.083% nebulizer solution Take 3 mLs (2.5 mg total) by nebulization every 6 (six) hours as needed for wheezing or shortness of breath. (Patient not taking: Reported on 04/12/2014)    . Cephalexin 500 MG tablet Take 1 tablet (500 mg total) by mouth 3 (three) times daily. 21 tablet 0  . diazepam (VALIUM) 5 MG tablet Take 1 tablet (5 mg total) by mouth at bedtime as needed for anxiety. 30 tablet 5  . fluticasone (FLONASE) 50 MCG/ACT nasal spray Place 2 sprays into both nostrils daily. (Patient not taking: Reported on 04/12/2014)    . nitroGLYCERIN (NITROSTAT) 0.4 MG SL tablet Place 0.4 mg under the tongue every 5 (five) minutes as needed for chest pain.    . polyethylene glycol (MIRALAX / GLYCOLAX) packet Take 17 g by mouth daily as needed. For constipation    . senna (SENOKOT) 8.6 MG TABS tablet Take 1 tablet by mouth daily as needed. For constipation     No current facility-administered medications for this visit.    Allergies-reviewed and updated Allergies  Allergen Reactions  . Azithromycin Hives, Diarrhea, Dermatitis and Rash  . Ciprofloxacin Swelling  . Levaquin [Levofloxacin] Hives  . Penicillins Hives    has had mild doses that she did not have reactions to  . Prednisone     Unknown   . Sulfamethoxazole Nausea And Vomiting    History   Social History  . Marital Status: Widowed    Spouse Name: N/A    Number of Children: 3  . Years of Education: N/A   Occupational  History  . retireed    Social History Main Topics  . Smoking status: Former Smoker -- 2.00 packs/day for 30 years    Types: Cigarettes    Quit date: 05/13/1983  . Smokeless tobacco: Never Used  . Alcohol Use: Yes     Comment: 1-2 glaases wine per day  . Drug Use: No  . Sexual Activity: No   Other Topics Concern  .  None   Social History Narrative   Living in assisted living at [facility]. Widowed 1991. 3 children. 7 grandkids. No greatgrandkids.       Walks down for her meals with a walker.    ADL-bath, clothe, cleaning   IADL-daughter does finances      HCPOA- daughter Janan [place]   Full code for now- have counseled and advised consider DNR/DNI   States enjoys living and would want resuscitation currently    ROS--See HPI   Objective: BP 130/72 mmHg  Temp(Src) 98.1 F (36.7 C)  Wt 113 lb (51.256 kg) Gen: NAD, resting comfortably HEENT: Mucous membranes are moist.  CV: RRR no murmurs rubs or gallops Lungs: CTAB with no wheeze Abdomen: soft/nontender/nondistended/normal bowel sounds. No suprapubic tenderness.  No cva tenderness.  Ext: no edema, prominent crepitus at knees Skin: warm, dry, no rash   Assessment/Plan:  Recurrent UTI  Symptomatic today. Treat with Keflex 7 days. Check culture.   Osteoarthritis, knee She does not have obvious weakness on exam. She does have crepitus. She doesn't move a lot because of her knee pain and her legs have become weaker. We will trial physical therapy to see if this helps patient.  Essential hypertension Continue amlodipine 2.5 mg and losartan 50 mg as good control.  Return precautions advised.   Orders Placed This Encounter  Procedures  . Culture, Urine  . Ambulatory referral to Home Health    Referral Priority:  Routine    Referral Type:  Home Health Care    Referral Reason:  Specialty Services Required    Requested Specialty:  [place]    Number of Visits Requested:  1    Meds ordered this  encounter  Medications  . diazepam (VALIUM) 5 MG tablet    Sig: Take 1 tablet (5 mg total) by mouth at bedtime as needed for anxiety.    Dispense:  30 tablet    Refill:  5  . Cephalexin 500 MG tablet    Sig: Take 1 tablet (500 mg total) by mouth 3 (three) times daily.    Dispense:  21 tablet    Refill:  0

## 2014-04-14 DIAGNOSIS — J441 Chronic obstructive pulmonary disease with (acute) exacerbation: Secondary | ICD-10-CM | POA: Diagnosis not present

## 2014-04-14 DIAGNOSIS — M6281 Muscle weakness (generalized): Secondary | ICD-10-CM | POA: Diagnosis not present

## 2014-04-14 DIAGNOSIS — C50919 Malignant neoplasm of unspecified site of unspecified female breast: Secondary | ICD-10-CM | POA: Diagnosis not present

## 2014-04-14 DIAGNOSIS — F0390 Unspecified dementia without behavioral disturbance: Secondary | ICD-10-CM | POA: Diagnosis not present

## 2014-04-14 DIAGNOSIS — I7300 Raynaud's syndrome without gangrene: Secondary | ICD-10-CM | POA: Diagnosis not present

## 2014-04-14 DIAGNOSIS — N390 Urinary tract infection, site not specified: Secondary | ICD-10-CM | POA: Diagnosis not present

## 2014-04-14 DIAGNOSIS — R918 Other nonspecific abnormal finding of lung field: Secondary | ICD-10-CM | POA: Diagnosis not present

## 2014-04-14 DIAGNOSIS — M1712 Unilateral primary osteoarthritis, left knee: Secondary | ICD-10-CM | POA: Diagnosis not present

## 2014-04-14 DIAGNOSIS — M1711 Unilateral primary osteoarthritis, right knee: Secondary | ICD-10-CM | POA: Diagnosis not present

## 2014-04-14 LAB — URINE CULTURE
COLONY COUNT: NO GROWTH
Organism ID, Bacteria: NO GROWTH

## 2014-04-15 ENCOUNTER — Other Ambulatory Visit: Payer: Self-pay | Admitting: Internal Medicine

## 2014-04-17 NOTE — Telephone Encounter (Signed)
Is this ok to refill?  

## 2014-04-17 NOTE — Telephone Encounter (Signed)
Medication called in 

## 2014-04-17 NOTE — Telephone Encounter (Signed)
yes

## 2014-04-19 DIAGNOSIS — J441 Chronic obstructive pulmonary disease with (acute) exacerbation: Secondary | ICD-10-CM | POA: Diagnosis not present

## 2014-04-19 DIAGNOSIS — M1712 Unilateral primary osteoarthritis, left knee: Secondary | ICD-10-CM | POA: Diagnosis not present

## 2014-04-19 DIAGNOSIS — R918 Other nonspecific abnormal finding of lung field: Secondary | ICD-10-CM | POA: Diagnosis not present

## 2014-04-19 DIAGNOSIS — N390 Urinary tract infection, site not specified: Secondary | ICD-10-CM | POA: Diagnosis not present

## 2014-04-19 DIAGNOSIS — M6281 Muscle weakness (generalized): Secondary | ICD-10-CM | POA: Diagnosis not present

## 2014-04-19 DIAGNOSIS — M1711 Unilateral primary osteoarthritis, right knee: Secondary | ICD-10-CM | POA: Diagnosis not present

## 2014-04-21 DIAGNOSIS — M1712 Unilateral primary osteoarthritis, left knee: Secondary | ICD-10-CM | POA: Diagnosis not present

## 2014-04-21 DIAGNOSIS — R918 Other nonspecific abnormal finding of lung field: Secondary | ICD-10-CM | POA: Diagnosis not present

## 2014-04-21 DIAGNOSIS — M1711 Unilateral primary osteoarthritis, right knee: Secondary | ICD-10-CM | POA: Diagnosis not present

## 2014-04-21 DIAGNOSIS — M6281 Muscle weakness (generalized): Secondary | ICD-10-CM | POA: Diagnosis not present

## 2014-04-21 DIAGNOSIS — J441 Chronic obstructive pulmonary disease with (acute) exacerbation: Secondary | ICD-10-CM | POA: Diagnosis not present

## 2014-04-21 DIAGNOSIS — N390 Urinary tract infection, site not specified: Secondary | ICD-10-CM | POA: Diagnosis not present

## 2014-04-26 DIAGNOSIS — J441 Chronic obstructive pulmonary disease with (acute) exacerbation: Secondary | ICD-10-CM | POA: Diagnosis not present

## 2014-04-26 DIAGNOSIS — N390 Urinary tract infection, site not specified: Secondary | ICD-10-CM | POA: Diagnosis not present

## 2014-04-26 DIAGNOSIS — M6281 Muscle weakness (generalized): Secondary | ICD-10-CM | POA: Diagnosis not present

## 2014-04-26 DIAGNOSIS — M1712 Unilateral primary osteoarthritis, left knee: Secondary | ICD-10-CM | POA: Diagnosis not present

## 2014-04-26 DIAGNOSIS — R918 Other nonspecific abnormal finding of lung field: Secondary | ICD-10-CM | POA: Diagnosis not present

## 2014-04-26 DIAGNOSIS — M1711 Unilateral primary osteoarthritis, right knee: Secondary | ICD-10-CM | POA: Diagnosis not present

## 2014-04-27 ENCOUNTER — Encounter: Payer: Self-pay | Admitting: Family Medicine

## 2014-04-27 ENCOUNTER — Ambulatory Visit (INDEPENDENT_AMBULATORY_CARE_PROVIDER_SITE_OTHER): Payer: Medicare Other | Admitting: Family Medicine

## 2014-04-27 ENCOUNTER — Other Ambulatory Visit: Payer: Self-pay | Admitting: Family Medicine

## 2014-04-27 VITALS — BP 120/70 | Temp 98.4°F | Wt 113.0 lb

## 2014-04-27 DIAGNOSIS — R300 Dysuria: Secondary | ICD-10-CM | POA: Diagnosis not present

## 2014-04-27 DIAGNOSIS — R5382 Chronic fatigue, unspecified: Secondary | ICD-10-CM

## 2014-04-27 DIAGNOSIS — N390 Urinary tract infection, site not specified: Secondary | ICD-10-CM

## 2014-04-27 DIAGNOSIS — D72829 Elevated white blood cell count, unspecified: Secondary | ICD-10-CM

## 2014-04-27 LAB — POCT URINALYSIS DIPSTICK
Bilirubin, UA: NEGATIVE
GLUCOSE UA: NEGATIVE
Ketones, UA: NEGATIVE
Nitrite, UA: NEGATIVE
PROTEIN UA: NEGATIVE
RBC UA: NEGATIVE
Spec Grav, UA: 1.02
UROBILINOGEN UA: 0.2
pH, UA: 5

## 2014-04-27 LAB — CBC WITH DIFFERENTIAL/PLATELET
Basophils Absolute: 0 10*3/uL (ref 0.0–0.1)
Basophils Relative: 0.5 % (ref 0.0–3.0)
EOS ABS: 0.1 10*3/uL (ref 0.0–0.7)
Eosinophils Relative: 2.2 % (ref 0.0–5.0)
HCT: 41.6 % (ref 36.0–46.0)
Hemoglobin: 13.5 g/dL (ref 12.0–15.0)
Lymphocytes Relative: 24.3 % (ref 12.0–46.0)
Lymphs Abs: 1.5 10*3/uL (ref 0.7–4.0)
MCHC: 32.5 g/dL (ref 30.0–36.0)
MCV: 86.4 fl (ref 78.0–100.0)
MONO ABS: 0.6 10*3/uL (ref 0.1–1.0)
Monocytes Relative: 10 % (ref 3.0–12.0)
NEUTROS PCT: 63 % (ref 43.0–77.0)
Neutro Abs: 3.9 10*3/uL (ref 1.4–7.7)
PLATELETS: 218 10*3/uL (ref 150.0–400.0)
RBC: 4.82 Mil/uL (ref 3.87–5.11)
RDW: 14 % (ref 11.5–15.5)
WBC: 6.1 10*3/uL (ref 4.0–10.5)

## 2014-04-27 LAB — COMPREHENSIVE METABOLIC PANEL
ALT: 9 U/L (ref 0–35)
AST: 15 U/L (ref 0–37)
Albumin: 4.2 g/dL (ref 3.5–5.2)
Alkaline Phosphatase: 64 U/L (ref 39–117)
BUN: 13 mg/dL (ref 6–23)
CO2: 23 meq/L (ref 19–32)
CREATININE: 0.7 mg/dL (ref 0.4–1.2)
Calcium: 9.5 mg/dL (ref 8.4–10.5)
Chloride: 103 mEq/L (ref 96–112)
GFR: 82.49 mL/min (ref >60.00)
GLUCOSE: 94 mg/dL (ref 70–99)
Potassium: 4.9 mEq/L (ref 3.5–5.1)
Sodium: 138 mEq/L (ref 135–145)
Total Bilirubin: 0.9 mg/dL (ref 0.2–1.2)
Total Protein: 7.2 g/dL (ref 6.0–8.3)

## 2014-04-27 LAB — TSH: TSH: 2.73 u[IU]/mL (ref 0.35–4.50)

## 2014-04-27 NOTE — Patient Instructions (Signed)
Check a culture to see if there is bacteria in your urine. If there is not, send you to urology  REgarding your fatigue, check some basic labs today. Follow up with me after the holidays to check in and to discuss other causes outside of tested bloodwork. This could also be due to aging as well.

## 2014-04-27 NOTE — Assessment & Plan Note (Signed)
Potentially recurrent UTI. Leukocytes on urinalysis. Given this is been stable over last 2 weeks we will send for culture and treat only if culture grows bacteria. We will plan on referral to urology if urine culture is negative.

## 2014-04-27 NOTE — Progress Notes (Signed)
Garret Reddish, MD Phone: [phone]  Subjective:   [name] [name] is a 80 y.o. year old very pleasant female patient who presents with the following:  Dysuria Patient seen 04/12/14 and treated with keflex for 7 days for presumed UTI. Patient had taken 3 doses doxycycline before urine culture. Urine culture did not grow any bacteria. Patient states her symptoms have persisted including dysuria and urgency. She denies polyuria ROS-still with no fevers or chills or CVA tenderness. Mild suprapubic pain. No vaginal discharge or vaginal itching.  Fatigue Patient states she has not felt like herself and is not been the "ball of energy she used to be". She has a very hard time describing this other than saying that her mother in her 58s limited to bulk up the steps and she does not feel like she wants to do that or that she could. She understands this could be a normal part of aging ROS-no unintentional weight loss, fevers, chills, night sweats, melena or bright red blood per rectum. No worsening shortness of breath in regards to COPD. Patient does have known pulmonary nodule  History of prolapsed bladder and had surgery-chronic leakage since that time.  Past Medical History- Patient Active Problem List   Diagnosis Date Noted  . Anxiety 11/21/2013    Priority: High  . History of CVA (cerebrovascular accident) 07/29/2013    Priority: High  . Pulmonary nodules 07/29/2013    Priority: High  . COPD with emphysema Gold B 01/04/2010    Priority: High  . Recurrent UTI 07/13/2013    Priority: Medium  . History of breast cancer, T1b, N0, Lumpectomy 07/21/2007. 04/21/2011    Priority: Medium  . Essential hypertension 11/12/2006    Priority: Medium  . Allergic rhinitis 04/12/2014    Priority: Low  . Constipation 04/12/2014    Priority: Low  . Raynaud's syndrome 04/25/2009    Priority: Low  . GERD 04/19/2007    Priority: Low  . Memory loss 04/12/2014  . Osteoarthritis, knee 04/12/2014    Medications- reviewed and updated Current Outpatient Prescriptions  Medication Sig Dispense Refill  . amLODipine (NORVASC) 2.5 MG tablet Take 2.5 mg by mouth daily.    Marland Kitchen aspirin 325 MG tablet Take 325 mg by mouth daily.    . budesonide-formoterol (SYMBICORT) 160-4.5 MCG/ACT inhaler Inhale 2 puffs into the lungs 2 (two) times daily.    . Cephalexin 500 MG tablet Take 1 tablet (500 mg total) by mouth 3 (three) times daily. 21 tablet 0  . losartan (COZAAR) 50 MG tablet Take 50 mg by mouth daily.    Marland Kitchen omeprazole (PRILOSEC) 20 MG capsule Take 1 capsule (20 mg total) by mouth daily. 90 capsule 1  . polyethylene glycol (MIRALAX / GLYCOLAX) packet Take 17 g by mouth daily as needed. For constipation    . senna (SENOKOT) 8.6 MG TABS tablet Take 1 tablet by mouth daily as needed. For constipation    . albuterol (PROVENTIL) (2.5 MG/3ML) 0.083% nebulizer solution Take 3 mLs (2.5 mg total) by nebulization every 6 (six) hours as needed for wheezing or shortness of breath. (Patient not taking: Reported on 04/12/2014)    . diazepam (VALIUM) 5 MG tablet TAKE 1 TABLET AT BEDTIME (Patient not taking: Reported on 04/27/2014) 30 tablet 0  . fluticasone (FLONASE) 50 MCG/ACT nasal spray Place 2 sprays into both nostrils daily. (Patient not taking: Reported on 04/12/2014)    . nitroGLYCERIN (NITROSTAT) 0.4 MG SL tablet Place 0.4 mg under the tongue every 5 (five) minutes as  needed for chest pain.     No current facility-administered medications for this visit.     Objective: BP 120/70 mmHg  Temp(Src) 98.4 F (36.9 C)  Wt 113 lb (51.256 kg) Gen: NAD, resting comfortably CV: RRR no murmurs rubs or gallops Lungs: CTAB no crackles, wheeze, rhonchi Abdomen: mild suprapubic pain. No rebound or guarding No CVA tenderness Ext: no edema Skin: warm, dry, no rash   Assessment/Plan:  Dysuria with concern for Recurrent UTI Potentially recurrent UTI. Leukocytes on urinalysis. Given this is been stable over last 2  weeks we will send for culture and treat only if culture grows bacteria. We will plan on referral to urology if urine culture is negative.  Fatigue Labs largely normal as below. Nothing to account for fatigue. Could be normal part of aging. Malignancy also a concern. We discussed would have patient back after the holidays to go through history further to discuss other potential causes such as depression, malignancy. Patient agrees and I will see her back in a few weeks.  CBC with Differential     Status: None   Collection Time: 04/27/14 11:57 AM  Result Value Ref Range   WBC 6.1 4.0 - 10.5 K/uL   RBC 4.82 3.87 - 5.11 Mil/uL   Hemoglobin 13.5 12.0 - 15.0 g/dL   HCT 41.6 36.0 - 46.0 %   MCV 86.4 78.0 - 100.0 fl   MCHC 32.5 30.0 - 36.0 g/dL   RDW 14.0 11.5 - 15.5 %   Platelets 218.0 150.0 - 400.0 K/uL   Neutrophils Relative % 63.0 43.0 - 77.0 %   Lymphocytes Relative 24.3 12.0 - 46.0 %   Monocytes Relative 10.0 3.0 - 12.0 %   Eosinophils Relative 2.2 0.0 - 5.0 %   Basophils Relative 0.5 0.0 - 3.0 %   Neutro Abs 3.9 1.4 - 7.7 K/uL   Lymphs Abs 1.5 0.7 - 4.0 K/uL   Monocytes Absolute 0.6 0.1 - 1.0 K/uL   Eosinophils Absolute 0.1 0.0 - 0.7 K/uL   Basophils Absolute 0.0 0.0 - 0.1 K/uL  Comprehensive metabolic panel     Status: None   Collection Time: 04/27/14 11:57 AM  Result Value Ref Range   Sodium 138 135 - 145 mEq/L   Potassium 4.9 3.5 - 5.1 mEq/L   Chloride 103 96 - 112 mEq/L   CO2 23 19 - 32 mEq/L   Glucose, Bld 94 70 - 99 mg/dL   BUN 13 6 - 23 mg/dL   Creatinine, Ser 0.7 0.4 - 1.2 mg/dL   Total Bilirubin 0.9 0.2 - 1.2 mg/dL   Alkaline Phosphatase 64 39 - 117 U/L   AST 15 0 - 37 U/L   ALT 9 0 - 35 U/L   Total Protein 7.2 6.0 - 8.3 g/dL   Albumin 4.2 3.5 - 5.2 g/dL   Calcium 9.5 8.4 - 10.5 mg/dL   GFR 82.49 >60.00 mL/min  TSH     Status: None   Collection Time: 04/27/14 11:57 AM  Result Value Ref Range   TSH 2.73 0.35 - 4.50 uIU/mL  Had planned on smear review given  luekocytosis on 3 of last 4 WBC but those were likely acute. Doubt smear will be that beneficial at this point.   Orders Placed This Encounter  Procedures  . Urine culture

## 2014-04-28 LAB — MANUAL DIFFERENTIAL
Atypical Lymphocytes Manual: 0 %
BANDS MAN: 0 % (ref 0–5)
Basophils Manual: 0 % (ref 0–1)
Blasts Manual: 0 %
Eosinophils Manual: 2 % (ref 0–5)
Lymphocytes Manual: 27 % (ref 12–46)
METAMY MAN: 0 %
Monocytes Manual: 9 % (ref 3–12)
Myelocytes Manual: 0 %
NEUTRO MAN: 62 % (ref 43–77)

## 2014-04-29 LAB — URINE CULTURE
Colony Count: NO GROWTH
ORGANISM ID, BACTERIA: NO GROWTH

## 2014-05-01 NOTE — Addendum Note (Signed)
Addended by: Clyde Lundborg A on: 05/01/2014 02:05 PM   Modules accepted: Orders

## 2014-05-03 DIAGNOSIS — N390 Urinary tract infection, site not specified: Secondary | ICD-10-CM | POA: Diagnosis not present

## 2014-05-03 DIAGNOSIS — M1711 Unilateral primary osteoarthritis, right knee: Secondary | ICD-10-CM | POA: Diagnosis not present

## 2014-05-03 DIAGNOSIS — M6281 Muscle weakness (generalized): Secondary | ICD-10-CM | POA: Diagnosis not present

## 2014-05-03 DIAGNOSIS — J441 Chronic obstructive pulmonary disease with (acute) exacerbation: Secondary | ICD-10-CM | POA: Diagnosis not present

## 2014-05-03 DIAGNOSIS — M1712 Unilateral primary osteoarthritis, left knee: Secondary | ICD-10-CM | POA: Diagnosis not present

## 2014-05-03 DIAGNOSIS — R918 Other nonspecific abnormal finding of lung field: Secondary | ICD-10-CM | POA: Diagnosis not present

## 2014-05-10 DIAGNOSIS — R300 Dysuria: Secondary | ICD-10-CM | POA: Diagnosis not present

## 2014-05-10 DIAGNOSIS — R32 Unspecified urinary incontinence: Secondary | ICD-10-CM | POA: Diagnosis not present

## 2014-05-11 DIAGNOSIS — R918 Other nonspecific abnormal finding of lung field: Secondary | ICD-10-CM | POA: Diagnosis not present

## 2014-05-11 DIAGNOSIS — M6281 Muscle weakness (generalized): Secondary | ICD-10-CM | POA: Diagnosis not present

## 2014-05-11 DIAGNOSIS — M1711 Unilateral primary osteoarthritis, right knee: Secondary | ICD-10-CM | POA: Diagnosis not present

## 2014-05-11 DIAGNOSIS — N390 Urinary tract infection, site not specified: Secondary | ICD-10-CM | POA: Diagnosis not present

## 2014-05-11 DIAGNOSIS — M1712 Unilateral primary osteoarthritis, left knee: Secondary | ICD-10-CM | POA: Diagnosis not present

## 2014-05-11 DIAGNOSIS — J441 Chronic obstructive pulmonary disease with (acute) exacerbation: Secondary | ICD-10-CM | POA: Diagnosis not present

## 2014-05-24 ENCOUNTER — Encounter: Payer: Self-pay | Admitting: Family Medicine

## 2014-05-24 ENCOUNTER — Ambulatory Visit (INDEPENDENT_AMBULATORY_CARE_PROVIDER_SITE_OTHER): Payer: Medicare Other | Admitting: Family Medicine

## 2014-05-24 VITALS — BP 106/80 | Temp 97.6°F | Wt 118.0 lb

## 2014-05-24 DIAGNOSIS — R413 Other amnesia: Secondary | ICD-10-CM | POA: Diagnosis not present

## 2014-05-24 MED ORDER — DIAZEPAM 5 MG PO TABS: 5.0000 mg | ORAL_TABLET | Freq: Every day | ORAL | Status: DC

## 2014-05-24 NOTE — Progress Notes (Signed)
Garret Reddish, MD Phone: [phone]  Subjective:   [name] [name] is a 81 y.o. year old very pleasant female patient who presents concern for short term memory loss. Daughter and patient have noticed this over last 2 years. Trouble with names and will repeat herself in conversation. PHQ2 of 0. Seems to be somewhat stable and not worsening. Patient finished 12th grade and some college.  ROS- denies depressive symptoms, no SI/HI. No blurry vision or headaches. Occasional shortness of breath.   Past Medical History- Patient Active Problem List   Diagnosis Date Noted  . History of CVA (cerebrovascular accident) 07/29/2013    Priority: High  . Pulmonary nodules 07/29/2013    Priority: High  . COPD with emphysema Gold B 01/04/2010    Priority: High  . Memory loss 04/12/2014    Priority: Medium  . Insomnia 11/21/2013    Priority: Medium  . Recurrent UTI 07/13/2013    Priority: Medium  . History of breast cancer, T1b, N0, Lumpectomy 07/21/2007. 04/21/2011    Priority: Medium  . Essential hypertension 11/12/2006    Priority: Medium  . Allergic rhinitis 04/12/2014    Priority: Low  . Constipation 04/12/2014    Priority: Low  . Raynaud's syndrome 04/25/2009    Priority: Low  . GERD 04/19/2007    Priority: Low  . Osteoarthritis, knee 04/12/2014   Medications- reviewed and updated Current Outpatient Prescriptions  Medication Sig Dispense Refill  . amLODipine (NORVASC) 2.5 MG tablet Take 2.5 mg by mouth daily.    Marland Kitchen aspirin 325 MG tablet Take 325 mg by mouth daily.    Marland Kitchen losartan (COZAAR) 50 MG tablet Take 50 mg by mouth daily.    Marland Kitchen omeprazole (PRILOSEC) 20 MG capsule Take 1 capsule (20 mg total) by mouth daily. 90 capsule 1  . polyethylene glycol (MIRALAX / GLYCOLAX) packet Take 17 g by mouth daily as needed. For constipation    . senna (SENOKOT) 8.6 MG TABS tablet Take 1 tablet by mouth daily as needed. For constipation    . albuterol (PROVENTIL) (2.5 MG/3ML) 0.083% nebulizer  solution Take 3 mLs (2.5 mg total) by nebulization every 6 (six) hours as needed for wheezing or shortness of breath. (Patient not taking: Reported on 04/12/2014)    . budesonide-formoterol (SYMBICORT) 160-4.5 MCG/ACT inhaler Inhale 2 puffs into the lungs 2 (two) times daily.    . diazepam (VALIUM) 5 MG tablet Take 1 tablet (5 mg total) by mouth at bedtime. 30 tablet 5  . fluticasone (FLONASE) 50 MCG/ACT nasal spray Place 2 sprays into both nostrils daily. (Patient not taking: Reported on 04/12/2014)    . nitroGLYCERIN (NITROSTAT) 0.4 MG SL tablet Place 0.4 mg under the tongue every 5 (five) minutes as needed for chest pain.     No current facility-administered medications for this visit.    Objective: BP 106/80 mmHg  Temp(Src) 97.6 F (36.4 C)  Wt 118 lb (53.524 kg) Gen: NAD, resting comfortably Psych: normal mood  MMSE 28/30 (interlocking triangles, date)   Assessment/Plan:  Memory loss Scored 28/30 on MMSE and was close on date. PHQ2 of 0. Main trouble interlocking pentagons. Discussed would not label this cognitive impairment at this time and definitely not dementia. We will repeat testing in 6-12 months with another MMSE. If progression, consider testing for reversible causes of dementia or memory loss such as b12. Thyroid was normal just a month ago. B12 was normal in 2008 but only 314. Discussed diazepam when in system could cause some memory loss but  patient needs for insomnia and we refilled.     Return precautions advised. 4 month follow up. Full neuro exam with next MMSE.   Meds ordered this encounter  Medications  . diazepam (VALIUM) 5 MG tablet    Sig: Take 1 tablet (5 mg total) by mouth at bedtime.    Dispense:  30 tablet    Refill:  5

## 2014-05-24 NOTE — Assessment & Plan Note (Addendum)
Scored 28/30 on MMSE and was close on date. PHQ2 of 0. Main trouble interlocking pentagons. Discussed would not label this cognitive impairment at this time and definitely not dementia. We will repeat testing in 6-12 months with another MMSE. If progression, consider testing for reversible causes of dementia or memory loss such as b12. Thyroid was normal just a month ago. B12 was normal in 2008 but only 314. Discussed diazepam when in system could cause some memory loss but patient needs for insomnia and we refilled.

## 2014-05-24 NOTE — Patient Instructions (Signed)
Memory loss. Some short term loss. Not going to label cognitive impairment at this point. Cannot diagnose dementia at all at this point. Let's repeat this exam in 6-12 months.

## 2014-06-23 ENCOUNTER — Other Ambulatory Visit: Payer: Self-pay | Admitting: Family Medicine

## 2014-06-23 NOTE — Telephone Encounter (Signed)
Yes, full tab though. 6 months ok.

## 2014-06-23 NOTE — Telephone Encounter (Signed)
rx called in

## 2014-07-11 DIAGNOSIS — D1801 Hemangioma of skin and subcutaneous tissue: Secondary | ICD-10-CM | POA: Diagnosis not present

## 2014-07-11 DIAGNOSIS — Z08 Encounter for follow-up examination after completed treatment for malignant neoplasm: Secondary | ICD-10-CM | POA: Diagnosis not present

## 2014-07-11 DIAGNOSIS — Z85820 Personal history of malignant melanoma of skin: Secondary | ICD-10-CM | POA: Diagnosis not present

## 2014-07-11 DIAGNOSIS — L821 Other seborrheic keratosis: Secondary | ICD-10-CM | POA: Diagnosis not present

## 2014-07-25 ENCOUNTER — Observation Stay (HOSPITAL_COMMUNITY)
Admission: EM | Admit: 2014-07-25 | Discharge: 2014-07-26 | Disposition: A | Discharge status: Home / Self Care | Payer: Medicare Other | Attending: Internal Medicine | Admitting: Internal Medicine

## 2014-07-25 ENCOUNTER — Emergency Department (HOSPITAL_COMMUNITY): Payer: Medicare Other

## 2014-07-25 ENCOUNTER — Encounter: Payer: Self-pay | Admitting: Internal Medicine

## 2014-07-25 ENCOUNTER — Encounter (HOSPITAL_COMMUNITY): Payer: Self-pay | Admitting: *Deleted

## 2014-07-25 ENCOUNTER — Ambulatory Visit (INDEPENDENT_AMBULATORY_CARE_PROVIDER_SITE_OTHER): Payer: Medicare Other | Admitting: Internal Medicine

## 2014-07-25 VITALS — BP 120/80 | HR 79 | Temp 98.0°F | Resp 18 | Ht 61.0 in | Wt 120.0 lb

## 2014-07-25 DIAGNOSIS — I7300 Raynaud's syndrome without gangrene: Secondary | ICD-10-CM | POA: Diagnosis not present

## 2014-07-25 DIAGNOSIS — R5383 Other fatigue: Secondary | ICD-10-CM | POA: Diagnosis not present

## 2014-07-25 DIAGNOSIS — Z853 Personal history of malignant neoplasm of breast: Secondary | ICD-10-CM | POA: Insufficient documentation

## 2014-07-25 DIAGNOSIS — G4710 Hypersomnia, unspecified: Secondary | ICD-10-CM | POA: Diagnosis not present

## 2014-07-25 DIAGNOSIS — K589 Irritable bowel syndrome without diarrhea: Secondary | ICD-10-CM | POA: Diagnosis not present

## 2014-07-25 DIAGNOSIS — K5900 Constipation, unspecified: Secondary | ICD-10-CM | POA: Insufficient documentation

## 2014-07-25 DIAGNOSIS — Z7951 Long term (current) use of inhaled steroids: Secondary | ICD-10-CM | POA: Insufficient documentation

## 2014-07-25 DIAGNOSIS — J438 Other emphysema: Secondary | ICD-10-CM | POA: Diagnosis not present

## 2014-07-25 DIAGNOSIS — R0602 Shortness of breath: Secondary | ICD-10-CM | POA: Diagnosis not present

## 2014-07-25 DIAGNOSIS — R079 Chest pain, unspecified: Principal | ICD-10-CM | POA: Insufficient documentation

## 2014-07-25 DIAGNOSIS — K219 Gastro-esophageal reflux disease without esophagitis: Secondary | ICD-10-CM | POA: Diagnosis not present

## 2014-07-25 DIAGNOSIS — I10 Essential (primary) hypertension: Secondary | ICD-10-CM | POA: Insufficient documentation

## 2014-07-25 DIAGNOSIS — Z880 Allergy status to penicillin: Secondary | ICD-10-CM | POA: Diagnosis not present

## 2014-07-25 DIAGNOSIS — J439 Emphysema, unspecified: Secondary | ICD-10-CM | POA: Diagnosis present

## 2014-07-25 DIAGNOSIS — M810 Age-related osteoporosis without current pathological fracture: Secondary | ICD-10-CM | POA: Diagnosis not present

## 2014-07-25 DIAGNOSIS — I2510 Atherosclerotic heart disease of native coronary artery without angina pectoris: Secondary | ICD-10-CM | POA: Diagnosis not present

## 2014-07-25 DIAGNOSIS — J449 Chronic obstructive pulmonary disease, unspecified: Secondary | ICD-10-CM | POA: Insufficient documentation

## 2014-07-25 DIAGNOSIS — G4730 Sleep apnea, unspecified: Secondary | ICD-10-CM | POA: Insufficient documentation

## 2014-07-25 DIAGNOSIS — Z79899 Other long term (current) drug therapy: Secondary | ICD-10-CM | POA: Insufficient documentation

## 2014-07-25 DIAGNOSIS — Z8619 Personal history of other infectious and parasitic diseases: Secondary | ICD-10-CM | POA: Diagnosis not present

## 2014-07-25 DIAGNOSIS — Q438 Other specified congenital malformations of intestine: Secondary | ICD-10-CM | POA: Insufficient documentation

## 2014-07-25 DIAGNOSIS — F419 Anxiety disorder, unspecified: Secondary | ICD-10-CM | POA: Insufficient documentation

## 2014-07-25 DIAGNOSIS — K624 Stenosis of anus and rectum: Secondary | ICD-10-CM | POA: Insufficient documentation

## 2014-07-25 DIAGNOSIS — Z7982 Long term (current) use of aspirin: Secondary | ICD-10-CM | POA: Insufficient documentation

## 2014-07-25 DIAGNOSIS — Z87891 Personal history of nicotine dependence: Secondary | ICD-10-CM | POA: Diagnosis not present

## 2014-07-25 DIAGNOSIS — Z8673 Personal history of transient ischemic attack (TIA), and cerebral infarction without residual deficits: Secondary | ICD-10-CM

## 2014-07-25 DIAGNOSIS — I4891 Unspecified atrial fibrillation: Secondary | ICD-10-CM | POA: Insufficient documentation

## 2014-07-25 DIAGNOSIS — R0789 Other chest pain: Secondary | ICD-10-CM | POA: Diagnosis not present

## 2014-07-25 LAB — BASIC METABOLIC PANEL
ANION GAP: 11 (ref 5–15)
BUN: 9 mg/dL (ref 6–23)
CALCIUM: 9.4 mg/dL (ref 8.4–10.5)
CO2: 26 mmol/L (ref 19–32)
Chloride: 99 mmol/L (ref 96–112)
Creatinine, Ser: 0.84 mg/dL (ref 0.50–1.10)
GFR calc Af Amer: 70 mL/min — ABNORMAL LOW (ref >90)
GFR, EST NON AFRICAN AMERICAN: 60 mL/min — AB (ref >90)
Glucose, Bld: 139 mg/dL — ABNORMAL HIGH (ref 70–99)
Potassium: 4 mmol/L (ref 3.5–5.1)
SODIUM: 136 mmol/L (ref 135–145)

## 2014-07-25 LAB — CBC
HCT: 40.9 % (ref 36.0–46.0)
HEMOGLOBIN: 14 g/dL (ref 12.0–15.0)
MCH: 29 pg (ref 26.0–34.0)
MCHC: 34.2 g/dL (ref 30.0–36.0)
MCV: 84.7 fL (ref 78.0–100.0)
Platelets: 217 10*3/uL (ref 150–400)
RBC: 4.83 MIL/uL (ref 3.87–5.11)
RDW: 14 % (ref 11.5–15.5)
WBC: 7.8 10*3/uL (ref 4.0–10.5)

## 2014-07-25 LAB — TROPONIN I

## 2014-07-25 LAB — URINALYSIS, ROUTINE W REFLEX MICROSCOPIC
Bilirubin Urine: NEGATIVE
GLUCOSE, UA: NEGATIVE mg/dL
HGB URINE DIPSTICK: NEGATIVE
KETONES UR: NEGATIVE mg/dL
LEUKOCYTES UA: NEGATIVE
Nitrite: NEGATIVE
Protein, ur: NEGATIVE mg/dL
Specific Gravity, Urine: 1.006 (ref 1.005–1.030)
Urobilinogen, UA: 0.2 mg/dL (ref 0.0–1.0)
pH: 7.5 (ref 5.0–8.0)

## 2014-07-25 LAB — I-STAT TROPONIN, ED: Troponin i, poc: 0 ng/mL (ref 0.00–0.08)

## 2014-07-25 MED ORDER — LOSARTAN POTASSIUM 50 MG PO TABS
50.0000 mg | ORAL_TABLET | Freq: Every day | ORAL | Status: DC
  Administered 2014-07-25 – 2014-07-26 (×2): 50 mg via ORAL
  Filled 2014-07-25 (×2): qty 1

## 2014-07-25 MED ORDER — SENNA 8.6 MG PO TABS
1.0000 | ORAL_TABLET | Freq: Every day | ORAL | Status: DC | PRN
  Filled 2014-07-25: qty 1

## 2014-07-25 MED ORDER — POLYETHYLENE GLYCOL 3350 17 G PO PACK
17.0000 g | PACK | Freq: Every day | ORAL | Status: DC | PRN
  Filled 2014-07-25: qty 1

## 2014-07-25 MED ORDER — ACETAMINOPHEN 325 MG PO TABS: 650.0000 mg | ORAL_TABLET | ORAL | Status: DC | PRN

## 2014-07-25 MED ORDER — PANTOPRAZOLE SODIUM 40 MG PO TBEC
40.0000 mg | DELAYED_RELEASE_TABLET | Freq: Every day | ORAL | Status: DC
  Administered 2014-07-25 – 2014-07-26 (×2): 40 mg via ORAL
  Filled 2014-07-25 (×2): qty 1

## 2014-07-25 MED ORDER — FLUTICASONE PROPIONATE 50 MCG/ACT NA SUSP
2.0000 | Freq: Every day | NASAL | Status: DC
  Administered 2014-07-26: 2 via NASAL
  Filled 2014-07-25 (×2): qty 16

## 2014-07-25 MED ORDER — DIAZEPAM 5 MG PO TABS
5.0000 mg | ORAL_TABLET | Freq: Every day | ORAL | Status: DC
  Administered 2014-07-25: 5 mg via ORAL
  Filled 2014-07-25: qty 1

## 2014-07-25 MED ORDER — ONDANSETRON HCL 4 MG/2ML IJ SOLN: 4.0000 mg | Freq: Four times a day (QID) | INTRAMUSCULAR | Status: DC | PRN

## 2014-07-25 MED ORDER — ALBUTEROL SULFATE (2.5 MG/3ML) 0.083% IN NEBU: 2.5000 mg | INHALATION_SOLUTION | Freq: Four times a day (QID) | RESPIRATORY_TRACT | Status: DC | PRN

## 2014-07-25 MED ORDER — BUDESONIDE-FORMOTEROL FUMARATE 160-4.5 MCG/ACT IN AERO
2.0000 | INHALATION_SPRAY | Freq: Two times a day (BID) | RESPIRATORY_TRACT | Status: DC
  Filled 2014-07-25 (×2): qty 6

## 2014-07-25 MED ORDER — HEPARIN SODIUM (PORCINE) 5000 UNIT/ML IJ SOLN
5000.0000 [IU] | Freq: Three times a day (TID) | INTRAMUSCULAR | Status: DC
  Administered 2014-07-25 – 2014-07-26 (×2): 5000 [IU] via SUBCUTANEOUS
  Filled 2014-07-25: qty 1

## 2014-07-25 MED ORDER — AMLODIPINE BESYLATE 2.5 MG PO TABS
2.5000 mg | ORAL_TABLET | Freq: Every day | ORAL | Status: DC
  Administered 2014-07-25 – 2014-07-26 (×2): 2.5 mg via ORAL
  Filled 2014-07-25: qty 1

## 2014-07-25 MED ORDER — NITROGLYCERIN 0.4 MG SL SUBL: 0.4000 mg | SUBLINGUAL_TABLET | SUBLINGUAL | Status: DC | PRN

## 2014-07-25 MED ORDER — ASPIRIN 325 MG PO TABS
325.0000 mg | ORAL_TABLET | Freq: Every day | ORAL | Status: DC
  Administered 2014-07-26: 325 mg via ORAL
  Filled 2014-07-25 (×2): qty 1

## 2014-07-25 NOTE — ED Notes (Signed)
Pt states that she had an episode of chest pain last night that was left sided and sharp. Pt denies pain now but states that her cardiologist told to to come and be evaluated. Pt denies any complaints at this time.

## 2014-07-25 NOTE — H&P (Signed)
[facility] History and Physical  [name] [name] SPQ:[id number] DOB: [birth year] DOA: 07/25/2014  Referring physician: EDP PCP: Garret Reddish, MD   Chief Complaint: chest pain   HPI: [name] [name] is a 81 y.o. female h/o CAD, presents to ED with c/o chest pain.  Pain is left sided, radiates to left arm.  Symptoms awoke her from sleep last evening.  She went to PCP today who sent her to the ED for further evaluation.  Pain 5 to 10 mins then resolved spontaneously.  She did not take any NTG.  Associated with dizziness and lightheadedness that persists today.  Review of Systems: Systems reviewed.  As above, otherwise negative  Past Medical History  Diagnosis Date  . ANXIETY 08/16/2008  . CAD, UNSPECIFIED SITE 10/17/2008  . CHOLELITHIASIS 08/23/2008  . COPD 01/04/2010    FeV1 101%, DLCO64% 2008  . 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 DIVERTICULOSIS-COLON 04/07/2008  . GERD 04/19/2007  . HYPERSOMNIA, ASSOCIATED WITH SLEEP APNEA 02/01/2007  . HYPERTENSION 11/12/2006  . MITRAL VALVE PROLAPSE, HX OF 10/07/2008  . NEOPLASM, MALIGNANT, BREAST, RIGHT 02/15/2008  . OSTEOPOROSIS 04/04/2008  . Raynaud's syndrome 04/25/2009  . Airway obstruction   . History of diverticulitis of colon   . Tortuous colon   . Anal stricture   . Chronic constipation   . Fatty liver   . Hiatal hernia   . Esophageal stricture   . IBS (irritable bowel syndrome)   . Pulmonary fibrosis   . Sleep apnea   . COPD (chronic obstructive pulmonary disease) 01/04/2010  . Colitis     sigmoid  . Candida esophagitis   . Diverticulitis   . Hearing aid worn     bilateral  . Atrial fibrillation     she denies known hx of afib 02/24/13  . Complication of anesthesia     difficult intubation   Past Surgical History  Procedure Laterality Date  . Abdominal hysterectomy    . Bilateral salpingoophorectomy      s/p prolapsed bladder  . Bladder surgery      prolapsed  . Thoracotomy      granuloma, pulmonary-thoracotomy   . Breast surgery  07-21-07    mastectomy (right), all margins neg and LNs neg   . Appendectomy    . Tonsillectomy    . Rotator cuff repair    . Mastectomy      right  . Breast cyst excision      left  . Mouth surgery    . Eye surgery      cataract removal bilateral  . Melanoma excision      rt clavicle Dr Rhoderick [facility] office)  . Lung surgery     Social History:  reports that she quit smoking about 31 years ago. Her smoking use included Cigarettes. She has a 60 pack-year smoking history. She has never used smokeless tobacco. She reports that she drinks alcohol. She reports that she does not use illicit drugs.  Allergies  Allergen Reactions  . Azithromycin Hives, Diarrhea, Dermatitis and Rash  . Ciprofloxacin Swelling  . Levaquin [Levofloxacin] Hives  . Penicillins Hives    has had mild doses that she did not have reactions to  . Prednisone     Unknown   . Sulfamethoxazole Nausea And Vomiting    Family History  Problem Relation Age of Onset  . Parkinsonism Father   . Coronary artery disease Father   . Breast cancer      aunt  . Colon cancer  aunt, age 84  . Uterine cancer      aunt  . Stroke Mother   . Heart failure Mother   . Breast cancer Maternal Aunt   . Colon cancer Maternal Aunt   . Uterine cancer Maternal Aunt      Prior to Admission medications   Medication Sig Start Date End Date Taking? Authorizing Provider  albuterol (PROVENTIL) (2.5 MG/3ML) 0.083% nebulizer solution Take 3 mLs (2.5 mg total) by nebulization every 6 (six) hours as needed for wheezing or shortness of breath. 07/27/13  Yes Modena Jansky, MD  amLODipine (NORVASC) 2.5 MG tablet Take 2.5 mg by mouth daily.   Yes Historical Provider, MD  aspirin 325 MG tablet Take 325 mg by mouth daily.   Yes Historical Provider, MD  budesonide-formoterol (SYMBICORT) 160-4.5 MCG/ACT inhaler Inhale 2 puffs into the lungs 2 (two) times daily.   Yes Historical Provider, MD  diazepam (VALIUM) 5 MG tablet Take 1  tablet (5 mg total) by mouth at bedtime. 06/23/14  Yes Dorena Cookey, MD  fluticasone [facility]) 50 MCG/ACT nasal spray Place 2 sprays into both nostrils daily. 12/05/13  Yes Elsie Stain, MD  losartan (COZAAR) 50 MG tablet Take 50 mg by mouth daily.   Yes Historical Provider, MD  nitroGLYCERIN (NITROSTAT) 0.4 MG SL tablet Place 0.4 mg under the tongue every 5 (five) minutes as needed for chest pain.   Yes Historical Provider, MD  omeprazole (PRILOSEC) 20 MG capsule Take 1 capsule (20 mg total) by mouth daily. 12/28/13  Yes Bruce Kendall Flack, MD  polyethylene glycol (MIRALAX / GLYCOLAX) packet Take 17 g by mouth daily as needed. For constipation 07/27/13  Yes Modena Jansky, MD  senna (SENOKOT) 8.6 MG TABS tablet Take 1 tablet by mouth daily as needed. For constipation 07/27/13  Yes Modena Jansky, MD   Physical Exam: Filed Vitals:   07/25/14 2001  BP: 116/53  Pulse: 71  Temp:   Resp: 23    BP 116/53 mmHg  Pulse 71  Temp(Src) 97.5 F (36.4 C) (Oral)  Resp 23  SpO2 97%  General Appearance:    Alert, oriented, no distress, appears stated age  Head:    Normocephalic, atraumatic  Eyes:    PERRL, EOMI, sclera non-icteric        Nose:   Nares without drainage or epistaxis. Mucosa, turbinates normal  Throat:   Moist mucous membranes. Oropharynx without erythema or exudate.  Neck:   Supple. No carotid bruits.  No thyromegaly.  No lymphadenopathy.   Back:     No CVA tenderness, no spinal tenderness  Lungs:     Clear to auscultation bilaterally, without wheezes, rhonchi or rales  Chest wall:    No tenderness to palpitation  Heart:    Regular rate and rhythm without murmurs, gallops, rubs  Abdomen:     Soft, non-tender, nondistended, normal bowel sounds, no organomegaly  Genitalia:    deferred  Rectal:    deferred  Extremities:   No clubbing, cyanosis or edema.  Pulses:   2+ and symmetric all extremities  Skin:   Skin color, texture, turgor normal, no rashes or lesions  Lymph nodes:    Cervical, supraclavicular, and axillary nodes normal  Neurologic:   CNII-XII intact. Normal strength, sensation and reflexes      throughout    Labs on Admission:  Basic Metabolic Panel:  Recent Labs Lab 07/25/14 1857  NA 136  K 4.0  CL 99  CO2 26  GLUCOSE 139*  BUN 9  CREATININE 0.84  CALCIUM 9.4   Liver Function Tests: No results for input(s): AST, ALT, ALKPHOS, BILITOT, PROT, ALBUMIN in the last 168 hours. No results for input(s): LIPASE, AMYLASE in the last 168 hours. No results for input(s): AMMONIA in the last 168 hours. CBC:  Recent Labs Lab 07/25/14 1857  WBC 7.8  HGB 14.0  HCT 40.9  MCV 84.7  PLT 217   Cardiac Enzymes: No results for input(s): CKTOTAL, CKMB, CKMBINDEX, TROPONINI in the last 168 hours.  BNP (last 3 results) No results for input(s): PROBNP in the last 8760 hours. CBG: No results for input(s): GLUCAP in the last 168 hours.  Radiological Exams on Admission: Dg Chest 2 View  07/25/2014   CLINICAL DATA:  Acute onset of left upper chest pain. Initial encounter.  EXAM: CHEST  2 VIEW  COMPARISON:  Chest radiograph performed 12/21/2013  FINDINGS: The lungs are well-aerated. A calcified 1.8 cm nodule at the left mid upper lung zone is stable in appearance. Associated scarring is noted. Mild scarring is again noted at the medial right lung base. No pleural effusion or pneumothorax is seen.  The heart is mildly enlarged. No acute osseous abnormalities are seen. Chronic left-sided rib deformities are seen.  IMPRESSION: 1. No acute cardiopulmonary process seen. 2. Mild cardiomegaly. 3. Calcified 1.8 cm nodule at the mid left mid upper lung zone is stable. Bilateral scarring noted.   Electronically Signed   By: Garald Balding M.D.   On: 07/25/2014 19:49    EKG: Independently reviewed.  Assessment/Plan Active Problems:   Chest pain with moderate risk for cardiac etiology   1. Chest pain - HEART score 5 1. Chest pain obs 2. Tele monitor 3. Serial  trops 4. NPO after midnight    Code Status: Full Code  Family Communication: No family in room Disposition Plan: admit to obs   Time spent: 50 min  Jimia Gentles M. [facility] Pager [phone]  If 7AM-7PM, please contact the day team taking care of the patient [email or web address] Password [email or web address] 07/25/2014, 9:10 PM

## 2014-07-25 NOTE — Patient Instructions (Signed)
Report to [facility] emergency department immediately for further evaluation and management

## 2014-07-25 NOTE — Progress Notes (Signed)
Subjective:    Patient ID: [name] [name], female    DOB: [birth year], 81 y.o.   MRN: [id number]  HPI 81 year old patient who has a history of coronary artery disease.  She has been followed periodically by cardiology.  For the past 2 weeks she has had some fatigue and weakness involving the legs.  Due to these symptoms, she was considering a follow-up evaluation and last night developed chest pain.  Pain was described as left-sided and sharp with radiation down the left arm (and left leg).  She states the pain lasted 5 or 10 minutes.  She did not take any nitroglycerin.  Associated complaints include dizziness and lightheadedness that  persists today. She has been quite concerned about the chest pain and does not feel it was indigestion. EKG reviewed and revealed left atrial enlargement with AV block.  Possible old inferior scar.  Poor R-wave progression consistent with COPD and possible old anterior infarction.  No acute changes  Past Medical History  Diagnosis Date  . ANXIETY 08/16/2008  . CAD, UNSPECIFIED SITE 10/17/2008  . CHOLELITHIASIS 08/23/2008  . COPD 01/04/2010    FeV1 101%, DLCO64% 2008  . 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 DIVERTICULOSIS-COLON 04/07/2008  . GERD 04/19/2007  . HYPERSOMNIA, ASSOCIATED WITH SLEEP APNEA 02/01/2007  . HYPERTENSION 11/12/2006  . MITRAL VALVE PROLAPSE, HX OF 10/07/2008  . NEOPLASM, MALIGNANT, BREAST, RIGHT 02/15/2008  . OSTEOPOROSIS 04/04/2008  . Raynaud's syndrome 04/25/2009  . Airway obstruction   . History of diverticulitis of colon   . Tortuous colon   . Anal stricture   . Chronic constipation   . Fatty liver   . Hiatal hernia   . Esophageal stricture   . IBS (irritable bowel syndrome)   . Pulmonary fibrosis   . Sleep apnea   . COPD (chronic obstructive pulmonary disease) 01/04/2010  . Colitis     sigmoid  . Candida esophagitis   . Diverticulitis   . Hearing aid worn     bilateral  . Atrial fibrillation     she denies known hx of  afib 02/24/13  . Complication of anesthesia     difficult intubation    History   Social History  . Marital Status: Widowed    Spouse Name: N/A  . Number of Children: 3  . Years of Education: N/A   Occupational History  . retireed    Social History Main Topics  . Smoking status: Former Smoker -- 2.00 packs/day for 30 years    Types: Cigarettes    Quit date: 05/13/1983  . Smokeless tobacco: Never Used  . Alcohol Use: Yes     Comment: 1-2 glaases wine per day  . Drug Use: No  . Sexual Activity: No   Other Topics Concern  . Not on file   Social History Narrative   Living in assisted living at [facility]. Widowed 1991. 3 children. 7 grandkids. No greatgrandkids.       Walks down for her meals with a walker.    ADL-bath, clothe, cleaning   IADL-daughter does finances      HCPOA- daughter [name] [name]   Full code for now- have counseled and advised consider DNR/DNI   States enjoys living and would want resuscitation currently    Past Surgical History  Procedure Laterality Date  . Abdominal hysterectomy    . Bilateral salpingoophorectomy      s/p prolapsed bladder  . Bladder surgery      prolapsed  . Thoracotomy  granuloma, pulmonary-thoracotomy  . Breast surgery  07-21-07    mastectomy (right), all margins neg and LNs neg   . Appendectomy    . Tonsillectomy    . Rotator cuff repair    . Mastectomy      right  . Breast cyst excision      left  . Mouth surgery    . Eye surgery      cataract removal bilateral  . Melanoma excision      rt clavicle Dr Rhoderick [facility] office)  . Lung surgery      Family History  Problem Relation Age of Onset  . Parkinsonism Father   . Coronary artery disease Father   . Breast cancer      aunt  . Colon cancer      aunt, age 35  . Uterine cancer      aunt  . Stroke Mother   . Heart failure Mother   . Breast cancer Maternal Aunt   . Colon cancer Maternal Aunt   . Uterine cancer Maternal Aunt     Allergies    Allergen Reactions  . Azithromycin Hives, Diarrhea, Dermatitis and Rash  . Ciprofloxacin Swelling  . Levaquin [Levofloxacin] Hives  . Penicillins Hives    has had mild doses that she did not have reactions to  . Prednisone     Unknown   . Sulfamethoxazole Nausea And Vomiting    Current Outpatient Prescriptions on File Prior to Visit  Medication Sig Dispense Refill  . albuterol (PROVENTIL) (2.5 MG/3ML) 0.083% nebulizer solution Take 3 mLs (2.5 mg total) by nebulization every 6 (six) hours as needed for wheezing or shortness of breath.    Marland Kitchen amLODipine (NORVASC) 2.5 MG tablet Take 2.5 mg by mouth daily.    Marland Kitchen aspirin 325 MG tablet Take 325 mg by mouth daily.    . budesonide-formoterol (SYMBICORT) 160-4.5 MCG/ACT inhaler Inhale 2 puffs into the lungs 2 (two) times daily.    . diazepam (VALIUM) 5 MG tablet Take 1 tablet (5 mg total) by mouth at bedtime. 30 tablet 5  . fluticasone (FLONASE) 50 MCG/ACT nasal spray Place 2 sprays into both nostrils daily.    Marland Kitchen losartan (COZAAR) 50 MG tablet Take 50 mg by mouth daily.    . nitroGLYCERIN (NITROSTAT) 0.4 MG SL tablet Place 0.4 mg under the tongue every 5 (five) minutes as needed for chest pain.    Marland Kitchen omeprazole (PRILOSEC) 20 MG capsule Take 1 capsule (20 mg total) by mouth daily. 90 capsule 1  . polyethylene glycol (MIRALAX / GLYCOLAX) packet Take 17 g by mouth daily as needed. For constipation    . senna (SENOKOT) 8.6 MG TABS tablet Take 1 tablet by mouth daily as needed. For constipation     No current facility-administered medications on file prior to visit.    BP 120/80 mmHg  Pulse 79  Temp(Src) 98 F (36.7 C) (Oral)  Resp 18  Ht 5\' 1"  (1.549 m)  Wt 120 lb (54.432 kg)  BMI 22.69 kg/m2  SpO2 96%      Review of Systems  HENT: Negative for congestion, dental problem, hearing loss, rhinorrhea, sinus pressure, sore throat and tinnitus.   Eyes: Negative for pain, discharge and visual disturbance.  Respiratory: Negative for cough and  shortness of breath.   Cardiovascular: Positive for chest pain. Negative for palpitations and leg swelling.  Gastrointestinal: Negative for nausea, vomiting, abdominal pain, diarrhea, constipation, blood in stool and abdominal distention.  Genitourinary: Negative for dysuria, urgency,  frequency, hematuria, flank pain, vaginal bleeding, vaginal discharge, difficulty urinating, vaginal pain and pelvic pain.  Musculoskeletal: Negative for joint swelling, arthralgias and gait problem.  Skin: Negative for rash.  Neurological: Positive for dizziness, weakness and light-headedness. Negative for syncope, speech difficulty, numbness and headaches.  Hematological: Negative for adenopathy.  Psychiatric/Behavioral: Negative for behavioral problems, dysphoric mood and agitation. The patient is not nervous/anxious.        Objective:   Physical Exam  Constitutional: She is oriented to person, place, and time. She appears well-developed and well-nourished. No distress.   blood pressure 120/80 Pulse 80  HENT:  Head: Normocephalic.  Right Ear: External ear normal.  Left Ear: External ear normal.  Mouth/Throat: Oropharynx is clear and moist.  Eyes: Conjunctivae and EOM are normal. Pupils are equal, round, and reactive to light.  Neck: Normal range of motion. Neck supple. No thyromegaly present.  Cardiovascular: Normal rate, regular rhythm and normal heart sounds.   Dorsalis pedis pulses full Occasional ectopics  Pulmonary/Chest: Effort normal and breath sounds normal. No respiratory distress. She has no wheezes. She exhibits no tenderness.  O2 saturation 96  Abdominal: Soft. Bowel sounds are normal. She exhibits no mass. There is no tenderness.  Musculoskeletal: Normal range of motion. She exhibits no edema.  Lymphadenopathy:    She has no cervical adenopathy.  Neurological: She is alert and oriented to person, place, and time.  Skin: Skin is warm and dry. No rash noted.  Psychiatric: She has a  normal mood and affect. Her behavior is normal.          Assessment & Plan:   Atypical chest pain in a patient with coronary artery disease. The patient has been advised to report to [facility] ED for further evaluation and treatment. Hypertension History of right breast cancer History of prior CVA

## 2014-07-25 NOTE — ED Provider Notes (Signed)
CSN: [id number]     Arrival date & time 07/25/14  1842 History   First MD Initiated Contact with Patient 07/25/14 1924     Chief Complaint  Patient presents with  . Chest Pain     HPI Pt was seen at 2000. Per pt, c/o gradual onset and resolution of one episode of "chest pain" that woke her up from sleep last night. Pt describes the CP as located on the left side of her chest, "sharp," then "pressure." Was associated with SOB and diaphoresis. Pt states her symptoms lasted approximately 15 to 20 minutes before spontaneously resolving. Pt c/o generalized weakness today. Denies falling, no syncope/near syncope, no cough, no palpitations, no abd pain, no N/V/D, no back pain.    Past Medical History  Diagnosis Date  . ANXIETY 08/16/2008  . CAD, UNSPECIFIED SITE 10/17/2008  . CHOLELITHIASIS 08/23/2008  . COPD 01/04/2010    FeV1 101%, DLCO64% 2008  . 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 DIVERTICULOSIS-COLON 04/07/2008  . GERD 04/19/2007  . HYPERSOMNIA, ASSOCIATED WITH SLEEP APNEA 02/01/2007  . HYPERTENSION 11/12/2006  . MITRAL VALVE PROLAPSE, HX OF 10/07/2008  . NEOPLASM, MALIGNANT, BREAST, RIGHT 02/15/2008  . OSTEOPOROSIS 04/04/2008  . Raynaud's syndrome 04/25/2009  . Airway obstruction   . History of diverticulitis of colon   . Tortuous colon   . Anal stricture   . Chronic constipation   . Fatty liver   . Hiatal hernia   . Esophageal stricture   . IBS (irritable bowel syndrome)   . Pulmonary fibrosis   . Sleep apnea   . COPD (chronic obstructive pulmonary disease) 01/04/2010  . Colitis     sigmoid  . Candida esophagitis   . Diverticulitis   . Hearing aid worn     bilateral  . Atrial fibrillation     she denies known hx of afib 02/24/13  . Complication of anesthesia     difficult intubation   Past Surgical History  Procedure Laterality Date  . Abdominal hysterectomy    . Bilateral salpingoophorectomy      s/p prolapsed bladder  . Bladder surgery      prolapsed  .  Thoracotomy      granuloma, pulmonary-thoracotomy  . Breast surgery  07-21-07    mastectomy (right), all margins neg and LNs neg   . Appendectomy    . Tonsillectomy    . Rotator cuff repair    . Mastectomy      right  . Breast cyst excision      left  . Mouth surgery    . Eye surgery      cataract removal bilateral  . Melanoma excision      rt clavicle Dr Rhoder[facility] office)  . Lung surgery     Family History  Problem Relation Age of Onset  . Parkinsonism Father   . Coronary artery disease Father   . Breast cancer      aunt  . Colon cancer      aunt, age 20  . Uterine cancer      aunt  . Stroke Mother   . Heart failure Mother   . Breast cancer Maternal Aunt   . Colon cancer Maternal Aunt   . Uterine cancer Maternal Aunt    History  Substance Use Topics  . Smoking status: Former Smoker -- 2.00 packs/day for 30 years    Types: Cigarettes    Quit date: 05/13/1983  . Smokeless tobacco: Never Used  . Alcohol Use: Yes  Comment: 1-2 glaases wine per day    Review of Systems ROS: Statement: All systems negative except as marked or noted in the HPI; Constitutional: Negative for fever and chills. +generalized weakness. ; ; Eyes: Negative for eye pain, redness and discharge. ; ; ENMT: Negative for ear pain, hoarseness, nasal congestion, sinus pressure and sore throat. ; ; Cardiovascular: +CP, SOB, diaphoresis. Negative for palpitations, and peripheral edema. ; ; Respiratory: Negative for cough, wheezing and stridor. ; ; Gastrointestinal: Negative for nausea, vomiting, diarrhea, abdominal pain, blood in stool, hematemesis, jaundice and rectal bleeding. . ; ; Genitourinary: Negative for dysuria, flank pain and hematuria. ; ; Musculoskeletal: Negative for back pain and neck pain. Negative for swelling and trauma.; ; Skin: Negative for pruritus, rash, abrasions, blisters, bruising and skin lesion.; ; Neuro: Negative for headache, lightheadedness and neck stiffness. Negative for altered  level of consciousness , altered mental status, extremity weakness, paresthesias, involuntary movement, seizure and syncope.      Allergies  Azithromycin; Ciprofloxacin; Levaquin; Penicillins; Prednisone; and Sulfamethoxazole  Home Medications   Prior to Admission medications   Medication Sig Start Date End Date Taking? Authorizing Provider  albuterol (PROVENTIL) (2.5 MG/3ML) 0.083% nebulizer solution Take 3 mLs (2.5 mg total) by nebulization every 6 (six) hours as needed for wheezing or shortness of breath. 07/27/13  Yes Modena Jansky, MD  amLODipine (NORVASC) 2.5 MG tablet Take 2.5 mg by mouth daily.   Yes Historical Provider, MD  aspirin 325 MG tablet Take 325 mg by mouth daily.   Yes Historical Provider, MD  budesonide-formoterol (SYMBICORT) 160-4.5 MCG/ACT inhaler Inhale 2 puffs into the lungs 2 (two) times daily.   Yes Historical Provider, MD  diazepam (VALIUM) 5 MG tablet Take 1 tablet (5 mg total) by mouth at bedtime. 06/23/14  Yes Dorena Cookey, MD  fluticasone [facility]) 50 MCG/ACT nasal spray Place 2 sprays into both nostrils daily. 12/05/13  Yes Elsie Stain, MD  losartan (COZAAR) 50 MG tablet Take 50 mg by mouth daily.   Yes Historical Provider, MD  nitroGLYCERIN (NITROSTAT) 0.4 MG SL tablet Place 0.4 mg under the tongue every 5 (five) minutes as needed for chest pain.   Yes Historical Provider, MD  omeprazole (PRILOSEC) 20 MG capsule Take 1 capsule (20 mg total) by mouth daily. 12/28/13  Yes Bruce Kendall Flack, MD  polyethylene glycol (MIRALAX / GLYCOLAX) packet Take 17 g by mouth daily as needed. For constipation 07/27/13  Yes Modena Jansky, MD  senna (SENOKOT) 8.6 MG TABS tablet Take 1 tablet by mouth daily as needed. For constipation 07/27/13  Yes Modena Jansky, MD   BP 116/53 mmHg  Pulse 71  Temp(Src) 97.5 F (36.4 C) (Oral)  Resp 23  SpO2 97% Physical Exam  2005: Physical examination:  Nursing notes reviewed; Vital signs and O2 SAT reviewed;  Constitutional: Well  developed, Well nourished, Well hydrated, In no acute distress; Head:  Normocephalic, atraumatic; Eyes: EOMI, PERRL, No scleral icterus; ENMT: Mouth and pharynx normal, Mucous membranes moist; Neck: Supple, Full range of motion, No lymphadenopathy; Cardiovascular: Regular rate and rhythm, No gallop; Respiratory: Breath sounds clear & equal bilaterally, No wheezes.  Speaking full sentences with ease, Normal respiratory effort/excursion; Chest: Nontender, Movement normal; Abdomen: Soft, Nontender, Nondistended, Normal bowel sounds; Genitourinary: No CVA tenderness; Extremities: Pulses normal, No tenderness, No edema, No calf edema or asymmetry.; Neuro: AA&Ox3, Major CN grossly intact.  Speech clear. No gross focal motor or sensory deficits in extremities.; Skin: Color normal, Warm, Dry.  ED Course  Procedures     EKG Interpretation   Date/Time:  Tuesday July 25 2014 18:48:56 EDT Ventricular Rate:  83 PR Interval:  204 QRS Duration: 76 QT Interval:  364 QTC Calculation: 427 R Axis:   67 Text Interpretation:  Normal sinus rhythm with 1st degree A-V block  Possible Inferior infarct , age undetermined When compared with ECG of  12/21/2013 No significant change was found Confirmed by [facility]  MD,  Nunzio Cory [phone]) on 07/25/2014 7:30:27 PM      MDM  MDM Reviewed: previous chart, nursing note and vitals Reviewed previous: labs and ECG Interpretation: labs, ECG and x-ray   Results for orders placed or performed during the hospital encounter of 07/25/14  CBC  Result Value Ref Range   WBC 7.8 4.0 - 10.5 K/uL   RBC 4.83 3.87 - 5.11 MIL/uL   Hemoglobin 14.0 12.0 - 15.0 g/dL   HCT 40.9 36.0 - 46.0 %   MCV 84.7 78.0 - 100.0 fL   MCH 29.0 26.0 - 34.0 pg   MCHC 34.2 30.0 - 36.0 g/dL   RDW 14.0 11.5 - 15.5 %   Platelets 217 150 - 400 K/uL  Basic metabolic panel  Result Value Ref Range   Sodium 136 135 - 145 mmol/L   Potassium 4.0 3.5 - 5.1 mmol/L   Chloride 99 96 - 112 mmol/L   CO2 26  19 - 32 mmol/L   Glucose, Bld 139 (H) 70 - 99 mg/dL   BUN 9 6 - 23 mg/dL   Creatinine, Ser 0.84 0.50 - 1.10 mg/dL   Calcium 9.4 8.4 - 10.5 mg/dL   GFR calc non Af Amer 60 (L) >90 mL/min   GFR calc Af Amer 70 (L) >90 mL/min   Anion gap 11 5 - 15  I-stat troponin, ED (not at [facility])  Result Value Ref Range   Troponin i, poc 0.00 0.00 - 0.08 ng/mL   Comment 3           Dg Chest 2 View 07/25/2014   CLINICAL DATA:  Acute onset of left upper chest pain. Initial encounter.  EXAM: CHEST  2 VIEW  COMPARISON:  Chest radiograph performed 12/21/2013  FINDINGS: The lungs are well-aerated. A calcified 1.8 cm nodule at the left mid upper lung zone is stable in appearance. Associated scarring is noted. Mild scarring is again noted at the medial right lung base. No pleural effusion or pneumothorax is seen.  The heart is mildly enlarged. No acute osseous abnormalities are seen. Chronic left-sided rib deformities are seen.  IMPRESSION: 1. No acute cardiopulmonary process seen. 2. Mild cardiomegaly. 3. Calcified 1.8 cm nodule at the mid left mid upper lung zone is stable. Bilateral scarring noted.   Electronically Signed   By: Garald Balding M.D.   On: 07/25/2014 19:49    2100:  Denies CP while in the ED. Multiple risk factors for ACS, will observation admit. Dx and testing d/w pt and family.  Questions answered.  Verb understanding, agreeable to admit.   T/C to [facility] Dr. Alcario Drought, case discussed, including:  HPI, pertinent PM/SHx, VS/PE, dx testing, ED course and treatment:  Agreeable to admit, requests to write temporary orders, obtain observation tele bed to team MCAdmits.      Francine Graven, DO 07/28/14 1257

## 2014-07-25 NOTE — ED Notes (Signed)
Patient unable to give urine sample at this time.  Patient given water to drink.

## 2014-07-25 NOTE — Progress Notes (Signed)
Pre visit review using our clinic review tool, if applicable. No additional management support is needed unless otherwise documented below in the visit note. 

## 2014-07-26 DIAGNOSIS — R079 Chest pain, unspecified: Secondary | ICD-10-CM

## 2014-07-26 DIAGNOSIS — J438 Other emphysema: Secondary | ICD-10-CM

## 2014-07-26 LAB — TROPONIN I

## 2014-07-26 NOTE — Discharge Summary (Signed)
Physician Discharge Summary  [name] [name] XFG:[id number] DOB: November 09, 1923 DOA: 07/25/2014  PCP: Garret Reddish, MD  Admit date: 07/25/2014 Discharge date: 07/26/2014  Time spent: 40 minutes  Recommendations for Outpatient Follow-up:  1. Follow-up with primary care physician within one week. 2. Follow-up with Dr. Ron Parker on 09/15/2014 at 9 AM  Discharge Diagnoses:  Active Problems:   COPD with emphysema Gold B   Chest pain with moderate risk for cardiac etiology   Discharge Condition: Stable  Diet recommendation: Heart healthy  Filed Weights   07/25/14 2235  Weight: 53.842 kg (118 lb 11.2 oz)    History of present illness:  [name] [name] is a 81 y.o. female h/o CAD, presents to ED with c/o chest pain. Pain is left sided, radiates to left arm. Symptoms awoke her from sleep last evening. She went to PCP today who sent her to the ED for further evaluation. Pain 5 to 10 mins then resolved spontaneously. She did not take any NTG. Associated with dizziness and lightheadedness that persists today.  Hospital Course:   Chest pain Atypical chest pain with a sharp ache and pain goes toward the left shoulder. This happened more than once. ACS ruled out by -3 sets of cardiac enzymes and 12-lead EKG negative for ischemic changes. No suspicion for PE is as there is no tachycardia or hypoxia. Patient seen by cardiology and recommended no further workup. Patient follow-up with primary care physician, follow-up with cardiology in 6 weeks. Continue omeprazole, asked to take Tylenol for pain.  COPD Chronic, stable no acute changes, continue home bronchodilators.  Essential hypertension continue home medications.  History of breast cancer Chronic and stable, status post lumpectomy and 07/21/2007  Procedures:  None  Consultations:  Cardiology  Discharge Exam: Filed Vitals:   07/26/14 0500  BP: 126/56  Pulse: 66  Temp: 98.2 F (36.8 C)  Resp: 18   General: Alert and awake,  oriented x3, not in any acute distress. HEENT: anicteric sclera, pupils reactive to light and accommodation, EOMI CVS: S1-S2 clear, no murmur rubs or gallops Chest: clear to auscultation bilaterally, no wheezing, rales or rhonchi, tender on the right breast Abdomen: soft nontender, nondistended, normal bowel sounds, no organomegaly Extremities: no cyanosis, clubbing or edema noted bilaterally Neuro: Cranial nerves II-XII intact, no focal neurological deficits  Discharge Instructions   Discharge Instructions    Diet - low sodium heart healthy    Complete by:  As directed      Increase activity slowly    Complete by:  As directed           Current Discharge Medication List    CONTINUE these medications which have NOT CHANGED   Details  albuterol (PROVENTIL) (2.5 MG/3ML) 0.083% nebulizer solution Take 3 mLs (2.5 mg total) by nebulization every 6 (six) hours as needed for wheezing or shortness of breath.    amLODipine (NORVASC) 2.5 MG tablet Take 2.5 mg by mouth daily.    aspirin 325 MG tablet Take 325 mg by mouth daily.    budesonide-formoterol (SYMBICORT) 160-4.5 MCG/ACT inhaler Inhale 2 puffs into the lungs 2 (two) times daily.    diazepam (VALIUM) 5 MG tablet Take 1 tablet (5 mg total) by mouth at bedtime. Qty: 30 tablet, Refills: 5    fluticasone (FLONASE) 50 MCG/ACT nasal spray Place 2 sprays into both nostrils daily.    losartan (COZAAR) 50 MG tablet Take 50 mg by mouth daily.    nitroGLYCERIN (NITROSTAT) 0.4 MG SL tablet Place 0.4 mg under the  tongue every 5 (five) minutes as needed for chest pain.    omeprazole (PRILOSEC) 20 MG capsule Take 1 capsule (20 mg total) by mouth daily. Qty: 90 capsule, Refills: 1    polyethylene glycol (MIRALAX / GLYCOLAX) packet Take 17 g by mouth daily as needed. For constipation    senna (SENOKOT) 8.6 MG TABS tablet Take 1 tablet by mouth daily as needed. For constipation       Allergies  Allergen Reactions  . Azithromycin Hives,  Diarrhea, Dermatitis and Rash  . Ciprofloxacin Swelling  . Levaquin [Levofloxacin] Hives  . Penicillins Hives    has had mild doses that she did not have reactions to  . Prednisone     Unknown   . Sulfamethoxazole Nausea And Vomiting   Follow-up Information    Follow up with Dola Argyle, MD On 09/15/2014.   Specialty:  Cardiology   Why:  @ 9am   Contact information:   [address] [place] [address] [phone]        The results of significant diagnostics from this hospitalization (including imaging, microbiology, ancillary and laboratory) are listed below for reference.    Significant Diagnostic Studies: Dg Chest 2 View  07/25/2014   CLINICAL DATA:  Acute onset of left upper chest pain. Initial encounter.  EXAM: CHEST  2 VIEW  COMPARISON:  Chest radiograph performed 12/21/2013  FINDINGS: The lungs are well-aerated. A calcified 1.8 cm nodule at the left mid upper lung zone is stable in appearance. Associated scarring is noted. Mild scarring is again noted at the medial right lung base. No pleural effusion or pneumothorax is seen.  The heart is mildly enlarged. No acute osseous abnormalities are seen. Chronic left-sided rib deformities are seen.  IMPRESSION: 1. No acute cardiopulmonary process seen. 2. Mild cardiomegaly. 3. Calcified 1.8 cm nodule at the mid left mid upper lung zone is stable. Bilateral scarring noted.   Electronically Signed   By: Garald Balding M.D.   On: 07/25/2014 19:49    Microbiology: No results found for this or any previous visit (from the past 240 hour(s)).   Labs: Basic Metabolic Panel:  Recent Labs Lab 07/25/14 1857  NA 136  K 4.0  CL 99  CO2 26  GLUCOSE 139*  BUN 9  CREATININE 0.84  CALCIUM 9.4   Liver Function Tests: No results for input(s): AST, ALT, ALKPHOS, BILITOT, PROT, ALBUMIN in the last 168 hours. No results for input(s): LIPASE, AMYLASE in the last 168 hours. No results for input(s): AMMONIA in the last 168  hours. CBC:  Recent Labs Lab 07/25/14 1857  WBC 7.8  HGB 14.0  HCT 40.9  MCV 84.7  PLT 217   Cardiac Enzymes:  Recent Labs Lab 07/25/14 2121 07/26/14 0008 07/26/14 0256  TROPONINI <0.03 <0.03 <0.03   BNP: BNP (last 3 results) No results for input(s): BNP in the last 8760 hours.  ProBNP (last 3 results) No results for input(s): PROBNP in the last 8760 hours.  CBG: No results for input(s): GLUCAP in the last 168 hours.     Signed:  Melburn Treiber A  [facility] 07/26/2014, 2:28 PM

## 2014-07-26 NOTE — Consult Note (Signed)
CARDIOLOGY CONSULT NOTE   Patient ID: [name] [name] MRN: [id number] DOB/AGE: 1923-07-21 81 y.o.  Admit Date: 07/25/2014  Primary Physician: Garret Reddish, MD  Primary Cardiologist   Previously Mar Daring.   Clinical Summary [name] [name] is a 81 y.o.female who was admitted with an episode of chest pain. This awoke her at night. The pain resolved spontaneously with no nitroglycerin. There are no diagnostic EKG changes. Troponins are all normal so far. There is a history of subclinical coronary artery disease. I do not see any prior records of cardiac catheterization. Echocardiogram in March, 2015 revealed normal left ventricular function. She does have old small inferior Q waves. She has not had any recurring chest pain since admission to the hospital last night.  This morning the patient is stable. She is hard of hearing but can understand when I speak with her. She is not having any further chest pain. She is asking me this morning why she is having some leg weakness.   Allergies  Allergen Reactions  . Azithromycin Hives, Diarrhea, Dermatitis and Rash  . Ciprofloxacin Swelling  . Levaquin [Levofloxacin] Hives  . Penicillins Hives    has had mild doses that she did not have reactions to  . Prednisone     Unknown   . Sulfamethoxazole Nausea And Vomiting    Medications Scheduled Medications: . amLODipine  2.5 mg Oral Daily  . aspirin  325 mg Oral Daily  . budesonide-formoterol  2 puff Inhalation BID  . diazepam  5 mg Oral QHS  . fluticasone  2 spray Each Nare Daily  . heparin  5,000 Units Subcutaneous 3 times per day  . losartan  50 mg Oral Daily  . pantoprazole  40 mg Oral Daily     Infusions:     PRN Medications:  acetaminophen, albuterol, nitroGLYCERIN, ondansetron (ZOFRAN) IV, polyethylene glycol, senna   Past Medical History  Diagnosis Date  . ANXIETY 08/16/2008  . CAD, UNSPECIFIED SITE 10/17/2008  . CHOLELITHIASIS 08/23/2008  . COPD 01/04/2010    FeV1 101%,  DLCO64% 2008  . 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 DIVERTICULOSIS-COLON 04/07/2008  . GERD 04/19/2007  . HYPERSOMNIA, ASSOCIATED WITH SLEEP APNEA 02/01/2007  . HYPERTENSION 11/12/2006  . MITRAL VALVE PROLAPSE, HX OF 10/07/2008  . NEOPLASM, MALIGNANT, BREAST, RIGHT 02/15/2008  . OSTEOPOROSIS 04/04/2008  . Raynaud's syndrome 04/25/2009  . Airway obstruction   . History of diverticulitis of colon   . Tortuous colon   . Anal stricture   . Chronic constipation   . Fatty liver   . Hiatal hernia   . Esophageal stricture   . IBS (irritable bowel syndrome)   . Pulmonary fibrosis   . Sleep apnea   . COPD (chronic obstructive pulmonary disease) 01/04/2010  . Colitis     sigmoid  . Candida esophagitis   . Diverticulitis   . Hearing aid worn     bilateral  . Atrial fibrillation     she denies known hx of afib 02/24/13  . Complication of anesthesia     difficult intubation    Past Surgical History  Procedure Laterality Date  . Abdominal hysterectomy    . Bilateral salpingoophorectomy      s/p prolapsed bladder  . Bladder surgery      prolapsed  . Thoracotomy      granuloma, pulmonary-thoracotomy  . Breast surgery  07-21-07    mastectomy (right), all margins neg and LNs neg   . Appendectomy    . Tonsillectomy    .  Rotator cuff repair    . Mastectomy      right  . Breast cyst excision      left  . Mouth surgery    . Eye surgery      cataract removal bilateral  . Melanoma excision      rt clavicle Dr [facility]oder[facility] office)  . Lung surgery      Family History  Problem Relation Age of Onset  . Parkinsonism Father   . Coronary artery disease Father   . Breast cancer      aunt  . Colon cancer      aunt, age 62  . Uterine cancer      aunt  . Stroke Mother   . Heart failure Mother   . Breast cancer Maternal Aunt   . Colon cancer Maternal Aunt   . Uterine cancer Maternal Aunt     Social History [name] [name] reports that she quit smoking about 31 years ago. Her  smoking use included Cigarettes. She has a 60 pack-year smoking history. She has never used smokeless tobacco. Ms. [name] reports that she drinks alcohol.  Review of Systems   Patient denies fever, chills, headache, sweats, rash, change in vision,. She is hard of hearing. Patient denies cough, nausea vomiting, urinary symptoms. All other systems are reviewed and are negative.   Physical Examination Blood pressure 126/56, pulse 66, temperature 98.2 F (36.8 C), temperature source Oral, resp. rate 18, height 5\' 1"  (1.549 m), weight 118 lb 11.2 oz (53.842 kg), SpO2 95 %. No intake or output data in the 24 hours ending 07/26/14 4098  The patient is oriented. Affect is normal. I cannot fully assess her memory at this time. Head is atraumatic. Sclera and conjunctiva are normal. There is no jugulovenous distention. Lungs are clear. Respiratory effort is not labored. Cardiac exam reveals an S1 and S2. Abdomen is soft. There is no peripheral edema. There are no musculoskeletal deformities. There are no skin rashes.   Prior Cardiac Testing/Procedures  Lab Results  Basic Metabolic Panel:  Recent Labs Lab 07/25/14 1857  NA 136  K 4.0  CL 99  CO2 26  GLUCOSE 139*  BUN 9  CREATININE 0.84  CALCIUM 9.4    Liver Function Tests: No results for input(s): AST, ALT, ALKPHOS, BILITOT, PROT, ALBUMIN in the last 168 hours.  CBC:  Recent Labs Lab 07/25/14 1857  WBC 7.8  HGB 14.0  HCT 40.9  MCV 84.7  PLT 217    Cardiac Enzymes:  Recent Labs Lab 07/25/14 2121 07/26/14 0008 07/26/14 0256  TROPONINI <0.03 <0.03 <0.03    BNP: Invalid input(s): POCBNP   Radiology: Dg Chest 2 View  07/25/2014   CLINICAL DATA:  Acute onset of left upper chest pain. Initial encounter.  EXAM: CHEST  2 VIEW  COMPARISON:  Chest radiograph performed 12/21/2013  FINDINGS: The lungs are well-aerated. A calcified 1.8 cm nodule at the left mid upper lung zone is stable in appearance. Associated scarring is  noted. Mild scarring is again noted at the medial right lung base. No pleural effusion or pneumothorax is seen.  The heart is mildly enlarged. No acute osseous abnormalities are seen. Chronic left-sided rib deformities are seen.  IMPRESSION: 1. No acute cardiopulmonary process seen. 2. Mild cardiomegaly. 3. Calcified 1.8 cm nodule at the mid left mid upper lung zone is stable. Bilateral scarring noted.   Electronically Signed   By: Garald Balding M.D.   On: 07/25/2014 19:49    ECG:  I have reviewed the current EKGs and compared them to older EKGs. There are old small inferior Q waves. There is no diagnostic change.  Telemetry:   Impression and Recommendations     COPD with emphysema Gold B    Chest pain with moderate risk for cardiac etiology    By history there is subclinical coronary artery disease. She has old small inferior Q waves. 2-D echo in March, 2015 revealed normal left ventricular function. With her current admission, there is no EKG change. Troponins are normal. Her pain resolved rapidly with no nitroglycerin. She has not had any recurring pain in the hospital. There will be no need for in-hospital stress testing. I have instructed the nursing staff to go ahead and give her breakfast. At this point the etiology of her chest discomfort is not clear. However there is no proof of cardiac ischemia. No further in-hospital workup is recommended. The patient had seen Dr. Verl Blalock in the past for cardiology. She will need a new cardiologist. Please arrange for the patient to be seen by me in the office in 6 weeks. Cardiology will sign off.   Daryel November, MD 07/26/2014, 7:14 AM

## 2014-07-26 NOTE — Progress Notes (Signed)
UR completed 

## 2014-07-27 ENCOUNTER — Telehealth: Payer: Self-pay | Admitting: Family Medicine

## 2014-07-27 NOTE — Telephone Encounter (Signed)
emmi mailed  °

## 2014-09-15 ENCOUNTER — Encounter: Payer: Federal, State, Local not specified - PPO | Admitting: Cardiology

## 2014-10-06 ENCOUNTER — Ambulatory Visit: Payer: Medicare Other | Admitting: Family Medicine

## 2014-10-06 ENCOUNTER — Encounter: Payer: Federal, State, Local not specified - PPO | Admitting: Family Medicine

## 2014-10-06 ENCOUNTER — Telehealth: Payer: Self-pay | Admitting: *Deleted

## 2014-10-06 ENCOUNTER — Emergency Department (HOSPITAL_COMMUNITY)
Admission: EM | Admit: 2014-10-06 | Discharge: 2014-10-06 | Disposition: A | Discharge status: Home / Self Care | Payer: Medicare Other | Attending: Emergency Medicine | Admitting: Emergency Medicine

## 2014-10-06 ENCOUNTER — Encounter (HOSPITAL_COMMUNITY): Payer: Self-pay | Admitting: Emergency Medicine

## 2014-10-06 ENCOUNTER — Emergency Department (HOSPITAL_COMMUNITY): Payer: Medicare Other

## 2014-10-06 DIAGNOSIS — Z8619 Personal history of other infectious and parasitic diseases: Secondary | ICD-10-CM | POA: Insufficient documentation

## 2014-10-06 DIAGNOSIS — I2510 Atherosclerotic heart disease of native coronary artery without angina pectoris: Secondary | ICD-10-CM | POA: Insufficient documentation

## 2014-10-06 DIAGNOSIS — Z8739 Personal history of other diseases of the musculoskeletal system and connective tissue: Secondary | ICD-10-CM | POA: Insufficient documentation

## 2014-10-06 DIAGNOSIS — Z7982 Long term (current) use of aspirin: Secondary | ICD-10-CM | POA: Diagnosis not present

## 2014-10-06 DIAGNOSIS — Z853 Personal history of malignant neoplasm of breast: Secondary | ICD-10-CM | POA: Insufficient documentation

## 2014-10-06 DIAGNOSIS — Q438 Other specified congenital malformations of intestine: Secondary | ICD-10-CM | POA: Diagnosis not present

## 2014-10-06 DIAGNOSIS — J449 Chronic obstructive pulmonary disease, unspecified: Secondary | ICD-10-CM | POA: Diagnosis not present

## 2014-10-06 DIAGNOSIS — Z974 Presence of external hearing-aid: Secondary | ICD-10-CM | POA: Diagnosis not present

## 2014-10-06 DIAGNOSIS — Z880 Allergy status to penicillin: Secondary | ICD-10-CM | POA: Diagnosis not present

## 2014-10-06 DIAGNOSIS — K5900 Constipation, unspecified: Secondary | ICD-10-CM | POA: Diagnosis not present

## 2014-10-06 DIAGNOSIS — Z87891 Personal history of nicotine dependence: Secondary | ICD-10-CM | POA: Diagnosis not present

## 2014-10-06 DIAGNOSIS — K219 Gastro-esophageal reflux disease without esophagitis: Secondary | ICD-10-CM | POA: Insufficient documentation

## 2014-10-06 DIAGNOSIS — G4489 Other headache syndrome: Secondary | ICD-10-CM | POA: Insufficient documentation

## 2014-10-06 DIAGNOSIS — I10 Essential (primary) hypertension: Secondary | ICD-10-CM | POA: Diagnosis not present

## 2014-10-06 DIAGNOSIS — S0990XA Unspecified injury of head, initial encounter: Secondary | ICD-10-CM | POA: Diagnosis not present

## 2014-10-06 DIAGNOSIS — Z7951 Long term (current) use of inhaled steroids: Secondary | ICD-10-CM | POA: Diagnosis not present

## 2014-10-06 DIAGNOSIS — Z79899 Other long term (current) drug therapy: Secondary | ICD-10-CM | POA: Diagnosis not present

## 2014-10-06 DIAGNOSIS — F419 Anxiety disorder, unspecified: Secondary | ICD-10-CM | POA: Insufficient documentation

## 2014-10-06 DIAGNOSIS — R51 Headache: Secondary | ICD-10-CM | POA: Diagnosis present

## 2014-10-06 NOTE — ED Notes (Signed)
Pt states she hit her head getting into her car in November, pt denies LOC or N/V. Last night she noticed a knot in her head in the same area where she hit her head 6 months ago. Denies N/V at this time.

## 2014-10-06 NOTE — ED Provider Notes (Signed)
CSN: [id number]     Arrival date & time 10/06/14  69 History   First MD Initiated Contact with Patient 10/06/14 1609     Chief Complaint  Patient presents with  . Head Injury     (Consider location/radiation/quality/duration/timing/severity/associated sxs/prior Treatment) HPI   [name] [name] is a 81 y.o. female who presents for evaluation of headache for 2 days, spontaneous onset. She is taking both aspirin and Tylenol, intermittently, both of which have been helping somewhat. She is able to eat. She denies nausea, vomiting, weakness or dizziness. She feels somewhat more unsteady than usual at this time. She describes a mild head injury about 6 months ago, for which she has never been evaluated. She has occasional intermittent headaches that are left-sided. She is taking her usual medications. There are no other known modifying factors.   Past Medical History  Diagnosis Date  . ANXIETY 08/16/2008  . CAD, UNSPECIFIED SITE 10/17/2008  . CHOLELITHIASIS 08/23/2008  . COPD 01/04/2010    FeV1 101%, DLCO64% 2008  . 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 DIVERTICULOSIS-COLON 04/07/2008  . GERD 04/19/2007  . HYPERSOMNIA, ASSOCIATED WITH SLEEP APNEA 02/01/2007  . HYPERTENSION 11/12/2006  . MITRAL VALVE PROLAPSE, HX OF 10/07/2008  . NEOPLASM, MALIGNANT, BREAST, RIGHT 02/15/2008  . OSTEOPOROSIS 04/04/2008  . Raynaud's syndrome 04/25/2009  . Airway obstruction   . History of diverticulitis of colon   . Tortuous colon   . Anal stricture   . Chronic constipation   . Fatty liver   . Hiatal hernia   . Esophageal stricture   . IBS (irritable bowel syndrome)   . Pulmonary fibrosis   . Sleep apnea   . COPD (chronic obstructive pulmonary disease) 01/04/2010  . Colitis     sigmoid  . Candida esophagitis   . Diverticulitis   . Hearing aid worn     bilateral  . Atrial fibrillation     she denies known hx of afib 02/24/13  . Complication of anesthesia     difficult intubation   Past  Surgical History  Procedure Laterality Date  . Abdominal hysterectomy    . Bilateral salpingoophorectomy      s/p prolapsed bladder  . Bladder surgery      prolapsed  . Thoracotomy      granuloma, pulmonary-thoracotomy  . Breast surgery  07-21-07    mastectomy (right), all margins neg and LNs neg   . Appendectomy    . Tonsillectomy    . Rotator cuff repair    . Mastectomy      right  . Breast cyst excision      left  . Mouth surgery    . Eye surgery      cataract removal bilateral  . Melanoma excision      rt clavicle Dr Rhoderick [facility] office)  . Lung surgery     Family History  Problem Relation Age of Onset  . Parkinsonism Father   . Coronary artery disease Father   . Breast cancer      aunt  . Colon cancer      aunt, age 2  . Uterine cancer      aunt  . Stroke Mother   . Heart failure Mother   . Breast cancer Maternal Aunt   . Colon cancer Maternal Aunt   . Uterine cancer Maternal Aunt    History  Substance Use Topics  . Smoking status: Former Smoker -- 2.00 packs/day for 30 years    Types: Cigarettes  Quit date: 05/13/1983  . Smokeless tobacco: Never Used  . Alcohol Use: Yes     Comment: 1-2 glaases wine per day   OB History    No data available     Review of Systems  All other systems reviewed and are negative.     Allergies  Azithromycin; Ciprofloxacin; Levaquin; Penicillins; Prednisone; and Sulfamethoxazole  Home Medications   Prior to Admission medications   Medication Sig Start Date End Date Taking? Authorizing Provider  albuterol (PROVENTIL) (2.5 MG/3ML) 0.083% nebulizer solution Take 3 mLs (2.5 mg total) by nebulization every 6 (six) hours as needed for wheezing or shortness of breath. 07/27/13   Modena Jansky, MD  amLODipine (NORVASC) 2.5 MG tablet Take 2.5 mg by mouth daily.    Historical Provider, MD  aspirin 325 MG tablet Take 325 mg by mouth daily.    Historical Provider, MD  budesonide-formoterol (SYMBICORT) 160-4.5 MCG/ACT inhaler  Inhale 2 puffs into the lungs 2 (two) times daily.    Historical Provider, MD  diazepam (VALIUM) 5 MG tablet Take 1 tablet (5 mg total) by mouth at bedtime. 06/23/14   Dorena Cookey, MD  fluticasone (FLONASE) 50 MCG/ACT nasal spray Place 2 sprays into both nostrils daily. 12/05/13   Elsie Stain, MD  losartan (COZAAR) 50 MG tablet Take 50 mg by mouth daily.    Historical Provider, MD  nitroGLYCERIN (NITROSTAT) 0.4 MG SL tablet Place 0.4 mg under the tongue every 5 (five) minutes as needed for chest pain.    Historical Provider, MD  omeprazole (PRILOSEC) 20 MG capsule Take 1 capsule (20 mg total) by mouth daily. 12/28/13   Lisabeth Pick, MD  polyethylene glycol (MIRALAX / GLYCOLAX) packet Take 17 g by mouth daily as needed. For constipation 07/27/13   Modena Jansky, MD  senna (SENOKOT) 8.6 MG TABS tablet Take 1 tablet by mouth daily as needed. For constipation 07/27/13   Modena Jansky, MD   BP 137/82 mmHg  Pulse 84  Temp(Src) 97.9 F (36.6 C) (Oral)  Resp 16  Ht 5\' 1"  (1.549 m)  Wt 120 lb (54.432 kg)  BMI 22.69 kg/m2  SpO2 95% Physical Exam  Constitutional: She is oriented to person, place, and time. She appears well-developed. No distress.  Elderly, frail  HENT:  Head: Normocephalic and atraumatic.  Right Ear: External ear normal.  Left Ear: External ear normal.  No apparent swelling of her head, scalp or face.  Eyes: Conjunctivae and EOM are normal. Pupils are equal, round, and reactive to light.  Neck: Normal range of motion and phonation normal. Neck supple.  Cardiovascular: Normal rate, regular rhythm and normal heart sounds.   Pulmonary/Chest: Effort normal and breath sounds normal. She exhibits no bony tenderness.  Abdominal: Soft. There is no tenderness.  Musculoskeletal: Normal range of motion.  Neurological: She is alert and oriented to person, place, and time. No cranial nerve deficit or sensory deficit. She exhibits normal muscle tone. Coordination normal.  Romberg  negative. No ataxia. No dysarthria, aphasia or nystagmus.  Skin: Skin is warm, dry and intact.  Psychiatric: She has a normal mood and affect. Her behavior is normal. Judgment and thought content normal.  Nursing note and vitals reviewed.   ED Course  Procedures (including critical care time)  Medications - No data to display  Patient Vitals for the past 24 hrs:  BP Temp Temp src Pulse Resp SpO2 Height Weight  10/06/14 1539 137/82 mmHg 97.9 F (36.6 C) Oral 84 16  95 % 5\' 1"  (1.549 m) 120 lb (54.432 kg)    6:42 PM Reevaluation with update and discussion. After initial assessment and treatment, an updated evaluation reveals clinical examination is unchanged. Findings discussed with patient, and her daughter. All questions answered.. [place] Review Labs Reviewed - No data to display  Imaging Review Ct Head Wo Contrast  10/06/2014   CLINICAL DATA:  Hit head while getting into car. Found knot on head in the same region, from this injury 6 months ago. Initial encounter.  EXAM: CT HEAD WITHOUT CONTRAST  TECHNIQUE: Contiguous axial images were obtained from the base of the skull through the vertex without intravenous contrast.  COMPARISON:  MRI/MRA of the brain performed 07/26/2013, and CT of the head performed 03/30/2013  FINDINGS: There is no evidence of acute infarction, mass lesion, or intra- or extra-axial hemorrhage on CT.  Prominence of the ventricles and sulci reflects moderately severe cortical volume loss. Cavum septum pellucidum is noted. Diffuse periventricular and subcortical white matter change likely reflects small vessel ischemic microangiopathy. Cerebellar atrophy is noted.  The brainstem and fourth ventricle are within normal limits. The basal ganglia are unremarkable in appearance. The cerebral hemispheres demonstrate grossly normal gray-white differentiation. No mass effect or midline shift is seen.  There is no evidence of fracture; visualized osseous structures  are unremarkable in appearance. The orbits are within normal limits. There is mild partial opacification of the mastoid air cells bilaterally. The paranasal sinuses are well-aerated. No significant soft tissue abnormalities are seen.  IMPRESSION: 1. No evidence of traumatic intracranial injury or fracture. 2. Moderately severe cortical volume loss and diffuse small vessel ischemic microangiopathy. 3. Mild partial opacification of the mastoid air cells bilaterally.   Electronically Signed   By: Garald Balding M.D.   On: 10/06/2014 18:10     EKG Interpretation None      MDM   Final diagnoses:  Other headache syndrome    Nonspecific headache. No recent trauma. No clear cause found for headache. Patient is nontoxic. Doubt CVA, meningitis, acute sinusitis or metabolic instability.   Nursing Notes Reviewed/ Care Coordinated Applicable Imaging Reviewed Interpretation of Laboratory Data incorporated into ED treatment  The patient appears reasonably screened and/or stabilized for discharge and I doubt any other medical condition or other [facility] requiring further screening, evaluation, or treatment in the ED at this time prior to discharge.  Plan: Home Medications- usual; Home Treatments- rest; return here if the recommended treatment, does not improve the symptoms; Recommended follow up- PCP prn     Daleen Bo, MD 10/06/14 1844

## 2014-10-06 NOTE — Progress Notes (Signed)
This encounter was created in error - please disregard.

## 2014-10-06 NOTE — Telephone Encounter (Signed)
Patient was triaged before office visit.  Vitals are 122#, 98.7 temp, 120/70 BP, spo2 94, pulse 100. Patient complaining of "severe headache" on the right side of her head with "blurr vision" left eye, and swelling.  Patient explained that she hit her head 6 months ago in the same area of her forehead.  She does not have any nausea.  Due to her present symptoms and medical history of CVA, patient was advised to go the ER.  Patient decided to go to [facility].  Charge nurse was called and informed.  Patient was walked to her vehicle where her friend to her to the ER.

## 2014-10-06 NOTE — Discharge Instructions (Signed)
°  Use Tylenol, or aspirin as needed for pain control. Your symptoms may be related to sinus congestion, and possibly allergies. You can consider trying some decongestion medication or antihistamines to see if it helps.     General Headache Without Cause A headache is pain or discomfort felt around the head or neck area. The specific cause of a headache may not be found. There are many causes and types of headaches. A few common ones are:  Tension headaches.  Migraine headaches.  Cluster headaches.  Chronic daily headaches. HOME CARE INSTRUCTIONS   Keep all follow-up appointments with your caregiver or any specialist referral.  Only take over-the-counter or prescription medicines for pain or discomfort as directed by your caregiver.  Lie down in a dark, quiet room when you have a headache.  Keep a headache journal to find out what may trigger your migraine headaches. For example, write down:  What you eat and drink.  How much sleep you get.  Any change to your diet or medicines.  Try massage or other relaxation techniques.  Put ice packs or heat on the head and neck. Use these 3 to 4 times per day for 15 to 20 minutes each time, or as needed.  Limit stress.  Sit up straight, and do not tense your muscles.  Quit smoking if you smoke.  Limit alcohol use.  Decrease the amount of caffeine you drink, or stop drinking caffeine.  Eat and sleep on a regular schedule.  Get 7 to 9 hours of sleep, or as recommended by your caregiver.  Keep lights dim if bright lights bother you and make your headaches worse. SEEK MEDICAL CARE IF:   You have problems with the medicines you were prescribed.  Your medicines are not working.  You have a change from the usual headache.  You have nausea or vomiting. SEEK IMMEDIATE MEDICAL CARE IF:   Your headache becomes severe.  You have a fever.  You have a stiff neck.  You have loss of vision.  You have muscular weakness or  loss of muscle control.  You start losing your balance or have trouble walking.  You feel faint or pass out.  You have severe symptoms that are different from your first symptoms. MAKE SURE YOU:   Understand these instructions.  Will watch your condition.  Will get help right away if you are not doing well or get worse. Document Released: 04/28/2005 Document Revised: 07/21/2011 Document Reviewed: 05/14/2011 [facility] Patient Information 2015 [place], [place]. This information is not intended to replace advice given to you by your health care provider. Make sure you discuss any questions you have with your health care provider.

## 2014-10-12 ENCOUNTER — Ambulatory Visit (INDEPENDENT_AMBULATORY_CARE_PROVIDER_SITE_OTHER): Payer: Medicare Other | Admitting: Internal Medicine

## 2014-10-12 ENCOUNTER — Encounter: Payer: Self-pay | Admitting: Internal Medicine

## 2014-10-12 VITALS — BP 124/58 | HR 68 | Ht 61.0 in | Wt 121.0 lb

## 2014-10-12 DIAGNOSIS — R079 Chest pain, unspecified: Secondary | ICD-10-CM

## 2014-10-12 NOTE — Patient Instructions (Signed)
Medication Instructions:  No changes today  Labwork: None today  Testing/Procedures: None today  Follow-Up: Your physician wants you to follow-up in: February 2017 with Dr Ross. You will receive a reminder letter in the mail two months in advance. If you don't receive a letter, please call our office to schedule the follow-up appointment.      

## 2014-10-12 NOTE — Progress Notes (Signed)
Cardiology Office Note   Date:  10/12/2014   ID:  [name] [name], DOB 1923-12-02, MRN [id number]  PCP:  Garret Reddish, MD  Cardiologist:   Dorris Carnes, MD   No chief complaint on file.   F/U of CP History of Present Illness: [name] [name] is a 81 y.o. female with a history of CAD, MVP and HTN   I saw her in Aug of last year She was recently seen in ER and admitted for CP  Seen by Veatrice Bourbon  No cardiat testing done. Since seen patient says she has not had any furhter spells  Thinks it may have been due to gall bladder   Takes activites as tolerated.  No PND  No orthopnea         Current Outpatient Prescriptions  Medication Sig Dispense Refill  . albuterol (PROVENTIL) (2.5 MG/3ML) 0.083% nebulizer solution Take 3 mLs (2.5 mg total) by nebulization every 6 (six) hours as needed for wheezing or shortness of breath.    Marland Kitchen amLODipine (NORVASC) 2.5 MG tablet Take 2.5 mg by mouth at bedtime.     Marland Kitchen aspirin 325 MG tablet Take 325 mg by mouth daily.    . diazepam (VALIUM) 5 MG tablet Take 1 tablet (5 mg total) by mouth at bedtime. 30 tablet 5  . fluticasone (FLONASE) 50 MCG/ACT nasal spray Place 2 sprays into both nostrils daily.    Marland Kitchen losartan (COZAAR) 50 MG tablet Take 50 mg by mouth at bedtime.     . nitroGLYCERIN (NITROSTAT) 0.4 MG SL tablet Place 0.4 mg under the tongue every 5 (five) minutes as needed for chest pain.    Marland Kitchen omeprazole (PRILOSEC) 20 MG capsule Take 1 capsule (20 mg total) by mouth daily. 90 capsule 1  . polyethylene glycol (MIRALAX / GLYCOLAX) packet Take 17 g by mouth daily as needed. For constipation    . senna (SENOKOT) 8.6 MG TABS tablet Take 1 tablet by mouth daily as needed (constipation). For constipation     No current facility-administered medications for this visit.    Allergies:   Azithromycin; Ciprofloxacin; Levaquin; Penicillins; Prednisone; and Sulfamethoxazole   Past Medical History  Diagnosis Date  . ANXIETY 08/16/2008  . CAD, UNSPECIFIED SITE 10/17/2008  .  CHOLELITHIASIS 08/23/2008  . COPD 01/04/2010    FeV1 101%, DLCO64% 2008  . 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 DIVERTICULOSIS-COLON 04/07/2008  . GERD 04/19/2007  . HYPERSOMNIA, ASSOCIATED WITH SLEEP APNEA 02/01/2007  . HYPERTENSION 11/12/2006  . MITRAL VALVE PROLAPSE, HX OF 10/07/2008  . NEOPLASM, MALIGNANT, BREAST, RIGHT 02/15/2008  . OSTEOPOROSIS 04/04/2008  . Raynaud's syndrome 04/25/2009  . Airway obstruction   . History of diverticulitis of colon   . Tortuous colon   . Anal stricture   . Chronic constipation   . Fatty liver   . Hiatal hernia   . Esophageal stricture   . IBS (irritable bowel syndrome)   . Pulmonary fibrosis   . Sleep apnea   . COPD (chronic obstructive pulmonary disease) 01/04/2010  . Colitis     sigmoid  . Candida esophagitis   . Diverticulitis   . Hearing aid worn     bilateral  . Atrial fibrillation     she denies known hx of afib 02/24/13  . Complication of anesthesia     difficult intubation    Past Surgical History  Procedure Laterality Date  . Abdominal hysterectomy    . Bilateral salpingoophorectomy      s/p prolapsed bladder  .  Bladder surgery      prolapsed  . Thoracotomy      granuloma, pulmonary-thoracotomy  . Breast surgery  07-21-07    mastectomy (right), all margins neg and LNs neg   . Appendectomy    . Tonsillectomy    . Rotator cuff repair    . Mastectomy      right  . Breast cyst excision      left  . Mouth surgery    . Eye surgery      cataract removal bilateral  . Melanoma excision      rt clavicle Dr Rhoderick [facility] office)  . Lung surgery       Social History:  The patient  reports that she quit smoking about 31 years ago. Her smoking use included Cigarettes. She has a 60 pack-year smoking history. She has never used smokeless tobacco. She reports that she drinks alcohol. She reports that she does not use illicit drugs.   Family History:  The patient's family history includes Breast cancer in her maternal aunt  and another family member; Colon cancer in her maternal aunt and another family member; Coronary artery disease in her father; Heart failure in her mother; Parkinsonism in her father; Stroke in her mother; Uterine cancer in her maternal aunt and another family member.    ROS:  Please see the history of present illness. All other systems are reviewed and  Negative to the above problem except as noted.    PHYSICAL EXAM: VS:  BP 124/58 mmHg  Pulse 68  Ht 5\' 1"  (1.549 m)  Wt 121 lb (54.885 kg)  BMI 22.87 kg/m2  GEN: Well nourished, well developed, in no acute distress HEENT: normal Neck: no JVD, carotid bruits, or masses Cardiac: RRR; no murmurs, rubs, or gallops,no edema  Respiratory:  clear to auscultation bilaterally, normal work of breathing GI: soft, nontender, nondistended, + BS  No hepatomegaly  MS: no deformity Moving all extremities   Skin: warm and dry, no rash Neuro:  Strength and sensation are intact Psych: euthymic mood, full affect   EKG:  EKG is not ordered today.   Lipid Panel    Component Value Date/Time   CHOL 126 07/27/2013 0405   TRIG 87 07/27/2013 0405   HDL 25* 07/27/2013 0405   CHOLHDL 5.0 07/27/2013 0405   VLDL 17 07/27/2013 0405   LDLCALC 84 07/27/2013 0405   LDLDIRECT 138.7 12/29/2007 0817      Wt Readings from Last 3 Encounters:  10/12/14 121 lb (54.885 kg)  10/06/14 120 lb (54.432 kg)  07/25/14 118 lb 11.2 oz (53.842 kg)      ASSESSMENT AND PLAN: 1  CP  I am not convinced cardiac  Would keep on same regimen  2.  CAD  As above  3.  HTN  Follow.  Keep on current regimen   No changes   F/U in Feb   Signed, Dorris Carnes, MD  10/12/2014 12:25 PM    [address], [place], [place]  [address] Phone: [phone]; Fax: [phone]

## 2014-12-25 ENCOUNTER — Other Ambulatory Visit: Payer: Self-pay | Admitting: Internal Medicine

## 2015-01-10 NOTE — Patient Outreach (Signed)
[facility]) Care Management  01/10/2015  Abegail Kloeppel Jun 29, 1923 [id number]   Referral from [place] List, assigned Erenest Rasher, RN to outreach for screening.  Thanks, Ronnell Freshwater. [place], [place] Assistant Phone: [phone] Fax: [phone]

## 2015-01-12 ENCOUNTER — Other Ambulatory Visit: Payer: Self-pay

## 2015-01-12 NOTE — Patient Outreach (Signed)
[facility]) Care Management  01/12/2015  Kalaysia Demonbreun 1923/07/15 [id number]   Notification from Erenest Rasher, RN to close case due to patient refused [place] [facility] services.  Thanks, Ronnell Freshwater. [place], [place] Assistant Phone: [phone] Fax: [phone]

## 2015-01-12 NOTE — Patient Outreach (Signed)
Successful contact made with patient to discuss need for community care coordination. Patient identified herself using HIPPA identifiers by giving date of birth and address.  This RNCM explained the purpose of call, [facility] benefits and the referral process. Patient stated she did not think she needed services, was open to accepting a [facility] information packet through the mail.  Plan: Discharge from caseload.

## 2015-01-14 ENCOUNTER — Other Ambulatory Visit: Payer: Self-pay | Admitting: Family Medicine

## 2015-01-16 NOTE — Telephone Encounter (Signed)
Refill ok? 

## 2015-01-16 NOTE — Telephone Encounter (Signed)
Yes thanks 

## 2015-01-18 ENCOUNTER — Other Ambulatory Visit: Payer: Self-pay | Admitting: Surgery

## 2015-01-18 DIAGNOSIS — Z853 Personal history of malignant neoplasm of breast: Secondary | ICD-10-CM

## 2015-01-24 ENCOUNTER — Ambulatory Visit
Admission: RE | Admit: 2015-01-24 | Discharge: 2015-01-24 | Disposition: A | Discharge status: Home / Self Care | Payer: Medicare Other | Source: Ambulatory Visit | Attending: Surgery | Admitting: Surgery

## 2015-01-24 DIAGNOSIS — Z853 Personal history of malignant neoplasm of breast: Secondary | ICD-10-CM

## 2015-01-24 DIAGNOSIS — R928 Other abnormal and inconclusive findings on diagnostic imaging of breast: Secondary | ICD-10-CM | POA: Diagnosis not present

## 2015-01-30 ENCOUNTER — Encounter: Payer: Self-pay | Admitting: Family Medicine

## 2015-01-30 ENCOUNTER — Ambulatory Visit (INDEPENDENT_AMBULATORY_CARE_PROVIDER_SITE_OTHER): Payer: Medicare Other | Admitting: Family Medicine

## 2015-01-30 VITALS — BP 126/70 | HR 88 | Temp 98.6°F | Wt 123.0 lb

## 2015-01-30 DIAGNOSIS — N3001 Acute cystitis with hematuria: Secondary | ICD-10-CM | POA: Diagnosis not present

## 2015-01-30 DIAGNOSIS — R300 Dysuria: Secondary | ICD-10-CM | POA: Diagnosis not present

## 2015-01-30 DIAGNOSIS — Z23 Encounter for immunization: Secondary | ICD-10-CM | POA: Diagnosis not present

## 2015-01-30 LAB — POCT URINALYSIS DIPSTICK
Bilirubin, UA: NEGATIVE
Glucose, UA: NEGATIVE
Ketones, UA: NEGATIVE
NITRITE UA: NEGATIVE
PH UA: 6
Protein, UA: NEGATIVE
Spec Grav, UA: 1.015
Urobilinogen, UA: 0.2

## 2015-01-30 MED ORDER — CEPHALEXIN 500 MG PO CAPS: 500.0000 mg | ORAL_CAPSULE | Freq: Three times a day (TID) | ORAL | Status: DC

## 2015-01-30 NOTE — Progress Notes (Signed)
Garret Reddish, MD  Subjective:  [name] [name] is a 81 y.o. year old very pleasant female patient who presents with:  UTI concern -Dysuria and polyuria for 3-4 days. Seemed to get some better with cranberry juice but has persisted. Some suprapubic discomfort. Mild burning with urination. No treatments tried other than cranberry juice. Has had UTIs similar to this in the past. ROS- no vaginal discharge. No recent sex. No fever chills. No CVA tenderness  Past Medical History-  Patient Active Problem List   Diagnosis Date Noted  . History of CVA (cerebrovascular accident) 07/29/2013    Priority: High  . Pulmonary nodules 07/29/2013    Priority: High  . COPD with emphysema Gold B 01/04/2010    Priority: High  . Memory loss 04/12/2014    Priority: Medium  . Insomnia 11/21/2013    Priority: Medium  . Recurrent UTI 07/13/2013    Priority: Medium  . History of breast cancer, T1b, N0, Lumpectomy 07/21/2007. 04/21/2011    Priority: Medium  . Essential hypertension 11/12/2006    Priority: Medium  . Allergic rhinitis 04/12/2014    Priority: Low  . Constipation 04/12/2014    Priority: Low  . Raynaud's syndrome 04/25/2009    Priority: Low  . GERD 04/19/2007    Priority: Low   Medications- reviewed and updated Current Outpatient Prescriptions  Medication Sig Dispense Refill  . amLODipine (NORVASC) 2.5 MG tablet Take 2.5 mg by mouth at bedtime.     Marland Kitchen aspirin 325 MG tablet Take 325 mg by mouth daily.    . fluticasone (FLONASE) 50 MCG/ACT nasal spray Place 2 sprays into both nostrils daily.    Marland Kitchen losartan (COZAAR) 50 MG tablet Take 50 mg by mouth at bedtime.     Marland Kitchen omeprazole (PRILOSEC) 20 MG capsule Take 1 capsule (20 mg total) by mouth daily. 90 capsule 1  . albuterol (PROVENTIL) (2.5 MG/3ML) 0.083% nebulizer solution Take 3 mLs (2.5 mg total) by nebulization every 6 (six) hours as needed for wheezing or shortness of breath. (Patient not taking: Reported on 01/30/2015)    . cephALEXin (KEFLEX)  500 MG capsule Take 1 capsule (500 mg total) by mouth 3 (three) times daily. 21 capsule 0  . diazepam (VALIUM) 5 MG tablet TAKE 1 TABLET AT BEDTIME (Patient not taking: Reported on 01/30/2015) 30 tablet 5  . nitroGLYCERIN (NITROSTAT) 0.4 MG SL tablet Place 0.4 mg under the tongue every 5 (five) minutes as needed for chest pain.    . polyethylene glycol (MIRALAX / GLYCOLAX) packet Take 17 g by mouth daily as needed. For constipation    . senna (SENOKOT) 8.6 MG TABS tablet Take 1 tablet by mouth daily as needed (constipation). For constipation     No current facility-administered medications for this visit.    Objective: BP 126/70 mmHg  Pulse 88  Temp(Src) 98.6 F (37 C)  Wt 123 lb (55.792 kg) Gen: NAD, resting comfortably CV: RRR no murmurs rubs or gallops Lungs: CTAB no crackles, wheeze, rhonchi Abdomen: Mild suprapubic discomfort but otherwise nontender/soft/normal bowel sounds. No rebound or guarding.  Ext: no edema Skin: warm, dry, no rash Neuro: grossly normal, moves all extremities,Must speak loudly for patient   Results for orders placed or performed in visit on 01/30/15 (from the past 24 hour(s))  POC Urinalysis Dipstick     Status: Abnormal   Collection Time: 01/30/15  4:33 PM  Result Value Ref Range   Color, UA yellow    Clarity, UA clear    Glucose,  UA neg    Bilirubin, UA neg    Ketones, UA neg    Spec Grav, UA 1.015    Blood, UA trace    pH, UA 6.0    Protein, UA neg    Urobilinogen, UA 0.2    Nitrite, UA neg    Leukocytes, UA moderate (2+) (A) Negative   Assessment/Plan:  UTI- based on leukocytes and trace blood as well as symptoms Treat with keflex for 7 days Return if no resolution within 10 days or if new or worsening symptoms Send urine for culture given age  Orders Placed This Encounter  Procedures  . Urine culture    [facility]  . Flu Vaccine QUAD 36+ mos IM  . POC Urinalysis Dipstick    Meds ordered this encounter  Medications  . cephALEXin  (KEFLEX) 500 MG capsule    Sig: Take 1 capsule (500 mg total) by mouth 3 (three) times daily.    Dispense:  21 capsule    Refill:  0

## 2015-01-30 NOTE — Patient Instructions (Signed)
UTI Treat with keflex for 7 days Return if no resolution within 10 days or if new or worsening symptoms  Send urine for culture-start antibiotic but wait on results to make sure right one for you

## 2015-02-02 LAB — URINE CULTURE: Colony Count: >=100000

## 2015-02-09 ENCOUNTER — Other Ambulatory Visit: Payer: Self-pay | Admitting: Internal Medicine

## 2015-03-13 ENCOUNTER — Other Ambulatory Visit: Payer: Self-pay | Admitting: Internal Medicine

## 2015-03-14 ENCOUNTER — Encounter: Payer: Self-pay | Admitting: Pulmonary Disease

## 2015-03-14 ENCOUNTER — Ambulatory Visit (INDEPENDENT_AMBULATORY_CARE_PROVIDER_SITE_OTHER): Payer: Medicare Other | Admitting: Pulmonary Disease

## 2015-03-14 ENCOUNTER — Other Ambulatory Visit: Payer: Medicare Other

## 2015-03-14 VITALS — BP 116/70 | HR 94 | Ht 61.0 in | Wt 121.8 lb

## 2015-03-14 DIAGNOSIS — J449 Chronic obstructive pulmonary disease, unspecified: Secondary | ICD-10-CM | POA: Diagnosis not present

## 2015-03-14 DIAGNOSIS — K219 Gastro-esophageal reflux disease without esophagitis: Secondary | ICD-10-CM | POA: Diagnosis not present

## 2015-03-14 DIAGNOSIS — G4733 Obstructive sleep apnea (adult) (pediatric): Secondary | ICD-10-CM

## 2015-03-14 DIAGNOSIS — J3089 Other allergic rhinitis: Secondary | ICD-10-CM | POA: Diagnosis not present

## 2015-03-14 NOTE — Patient Instructions (Signed)
1. Try taking a low dose of Allegra over-the-counter to help with her sinus congestion. 2. Please call my office if you have any new breathing problems before your next appointment. 3. We will be doing a breathing and walking test at your next appointment. 4. I will see you back in 6 months or sooner if needed.

## 2015-03-14 NOTE — Progress Notes (Signed)
Subjective:    Patient ID: [name] [name], female    DOB: 06-05-1923, 81 y.o.   MRN: [id number]  C.C.:  Follow-up for COPD w/ Moderate Emphysema, GERD, & OSA/Nocturnal Hypoxia.  HPI COPD w/ Moderate Emphysema:  She is prescribed albuterol in her nebulizer. She denies any recurrent cough. No wheezing. No exacerbations since last appointment. She reports previously she has been hospitalized with pneumonia nearly on a yearly basis. She has dyspnea with ambulating down her hallway and cannot walk up stairs.  Left Lung Nodule:  Prior left thoracotomy that was complicated. 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 Joya Gaskins.  GERD:  Takes Prilosec regularly. Has intermittent brash water taste. No reflux or dyspepsia.  OSA/Nocturnal Hypoxia: Currently prescribed oxygen at 2 L/m while sleeping. Previously was prescribed CPAP but unable to tolerate.  Review of Systems She does reports pains in her chest with radiation down her left arm. This has been evaluated by E.D. Repeatedly. Denies any fever, chills, or sweats.   Allergies  Allergen Reactions  . Azithromycin Hives, Diarrhea, Dermatitis and Rash  . Ciprofloxacin Swelling  . Levaquin [Levofloxacin] Hives  . Penicillins Hives    has had mild doses that she did not have reactions to  . Prednisone     Unknown   . Sulfamethoxazole Nausea And Vomiting    Current Outpatient Prescriptions on File Prior to Visit  Medication Sig Dispense Refill  . albuterol (PROVENTIL) (2.5 MG/3ML) 0.083% nebulizer solution Take 3 mLs (2.5 mg total) by nebulization every 6 (six) hours as needed for wheezing or shortness of breath.    Marland Kitchen amLODipine (NORVASC) 2.5 MG tablet TAKE 1 TABLET BY MOUTH EVERY DAY FOR HTN 90 tablet 0  . aspirin 325 MG tablet Take 325 mg by mouth daily.    . cephALEXin (KEFLEX) 500 MG capsule Take 1 capsule (500 mg total) by mouth 3 (three) times daily. 21 capsule 0  .  diazepam (VALIUM) 5 MG tablet TAKE 1 TABLET AT BEDTIME 30 tablet 5  . fluticasone (FLONASE) 50 MCG/ACT nasal spray Place 2 sprays into both nostrils daily.    Marland Kitchen losartan (COZAAR) 50 MG tablet Take 50 mg by mouth at bedtime.     . nitroGLYCERIN (NITROSTAT) 0.4 MG SL tablet Place 0.4 mg under the tongue every 5 (five) minutes as needed for chest pain.    Marland Kitchen omeprazole (PRILOSEC) 20 MG capsule Take 1 capsule (20 mg total) by mouth daily. (Patient taking differently: Take 20 mg by mouth as needed. ) 90 capsule 1  . polyethylene glycol (MIRALAX / GLYCOLAX) packet Take 17 g by mouth daily as needed. For constipation    . senna (SENOKOT) 8.6 MG TABS tablet Take 1 tablet by mouth daily as needed (constipation). For constipation     No current facility-administered medications on file prior to visit.    Past Medical History  Diagnosis Date  . ANXIETY 08/16/2008  . CAD, UNSPECIFIED SITE 10/17/2008  . CHOLELITHIASIS 08/23/2008  . COPD 01/04/2010    FeV1 101%, DLCO64% 2008  . 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 DIVERTICULOSIS-COLON 04/07/2008  . GERD 04/19/2007  . HYPERSOMNIA, ASSOCIATED WITH SLEEP APNEA 02/01/2007  . HYPERTENSION 11/12/2006  . MITRAL VALVE PROLAPSE, HX OF 10/07/2008  . NEOPLASM, MALIGNANT, BREAST, RIGHT 02/15/2008  . OSTEOPOROSIS 04/04/2008  . Raynaud's syndrome 04/25/2009  . Airway obstruction   . History of diverticulitis of colon   . Tortuous colon   . Anal stricture   .  Chronic constipation   . Fatty liver   . Hiatal hernia   . Esophageal stricture   . IBS (irritable bowel syndrome)   . Pulmonary fibrosis ([place])   . Sleep apnea   . COPD (chronic obstructive pulmonary disease) ([place]) 01/04/2010  . Colitis     sigmoid  . Candida esophagitis ([place])   . Diverticulitis   . Hearing aid worn     bilateral  . Atrial fibrillation ([place])     she denies known hx of afib 02/24/13  . Complication of anesthesia     difficult intubation  . Allergic rhinitis   . Emphysema of  lung [facility])     Past Surgical History  Procedure Laterality Date  . Abdominal hysterectomy    . Bilateral salpingoophorectomy      s/p prolapsed bladder  . Bladder surgery      prolapsed  . Thoracotomy      granuloma, pulmonary-thoracotomy  . Breast surgery  07-21-07    mastectomy (right), all margins neg and LNs neg   . Appendectomy    . Tonsillectomy    . Rotator cuff repair    . Mastectomy      right  . Breast cyst excision      left  . Mouth surgery    . Eye surgery      cataract removal bilateral  . Melanoma excision      rt clavicle Dr [facility]oder[facility] office)  . Lung surgery      Family History  Problem Relation Age of Onset  . Parkinsonism Father   . Coronary artery disease Father   . Breast cancer      aunt  . Colon cancer      aunt, age 14  . Uterine cancer      aunt  . Stroke Mother   . Heart failure Mother   . Breast cancer Maternal Aunt   . Colon cancer Maternal Aunt   . Uterine cancer Maternal Aunt   . Lung disease Neg Hx     Social History   Social History  . Marital Status: Widowed    Spouse Name: N/A  . Number of Children: 3  . Years of Education: N/A   Occupational History  . retireed    Social History Main Topics  . Smoking status: Former Smoker -- 2.00 packs/day for 30 years    Types: Cigarettes    Quit date: 05/13/1983  . Smokeless tobacco: Never Used  . Alcohol Use: Yes     Comment: 1-2 glaases wine per day  . Drug Use: No  . Sexual Activity: No   Other Topics Concern  . None   Social History Narrative   Living in assisted living at [facility]. Widowed 1991. 3 children. 7 grandkids. No greatgrandkids.       Walks down for her meals with a walker.    ADL-bath, clothe, cleaning   IADL-daughter does finances      HCPOA- daughter Janan Ridge   Full code for now- have counseled and advised consider DNR/DNI   States enjoys living and would want resuscitation currently      [facility]:   Patient is originally from  [place]. Has traveled to nearly ever one of the [place] states. She has prior travel to [place] & the [place]. Previously enjoyed gardening. 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 Remote bird exposure to a canary as a child. Previously had bird houses in her  yard that she cleaned out.       Objective:   Physical Exam Blood pressure 116/70, pulse 94, height 5\' 1"  (1.549 m), weight 121 lb 12.8 oz (55.248 kg), SpO2 94 %. General:  Awake. Alert. No acute distress. Accompanied by daughter. Elderly female.  Integument:  Warm & dry. No rash on exposed skin. No bruising on exposed skin. HEENT:  Moist mucus membranes. No oral ulcers. No scleral injection or icterus. Moderate to severe bilateral nasal turbinate swelling. Cardiovascular:  Regular rate. No edema. No appreciable JVD.  Pulmonary:  Minimal right basilar crackles. Otherwise good aeration & clear to auscultation. Normal work of breathing on room air. Musculoskeletal:  Normal bulk and tone. No joint deformity or effusion appreciated.  PFT 10/11/12: FVC 1.46 L (67%) FEV1 1.27 L (82%) FEV1/FVC 0.87 FEF 25-75 2.09 L (186%)  IMAGING CXR PA/LAT 07/25/14 (personally reviewed by me): 1.8 cm calcified nodule in the left midlung. Mild cardiomegaly. No other parenchymal nodule or opacity appreciated. No pleural effusion. Mediastinum normal in contour.  CTA CHEST & CT ABD/PELVIS W/ 07/24/13 (personally reviewed by me): Calcified nodule in the lingula with associated opacification and some evidence of traction bronchiectasis consistent with prior scar formation. Tiny bilateral pleural effusions with some dependent atelectasis. No other nodule or opacity appreciated. Apical predominant moderate emphysematous changes. No pathologic mediastinal adenopathy. No pulmonary embolus. Splenic calcifications consistent with prior granulomatous exposure present. Extensive diverticulosis noted by the radiologist. Esophagus is patient to the cervical  spine.  CARDIAC TTE (07/14/13): LV normal in size with mild LVH. Focal basal hypertrophy of the septum is mild. EF 60-65%. Grade 1 diastolic dysfunction. LA & RA normal in size. RV normal in size and systolic function. No aortic stenosis or regurgitation. Aortic root normal in size. Trivial mitral regurgitation without stenosis. No pulmonic regurgitation. No tricuspid regurgitation. No pericardial effusion.     Assessment & Plan:  81 year old female with underlying COPD with emphysema but I suspect is mixed type given her history of allergies and allergic rhinitis. She has been intolerant of CPAP in the past and is currently on nasal cannula oxygen while sleeping. She does have ongoing issues from her allergic rhinitis and has previously tried Zyrtec as well as Claritin without relief. The patient's reflux seems to be well-controlled on her current dose of Prilosec and I feel is a potential contributor to her mycobacterial infection previously. Currently she does not seem to have any symptoms that would be suggestive of nontuberculous mycobacterial infection. I instructed the patient to contact my office if she had any new breathing problems before next appointment as I would be happy to see her sooner.   1. COPD with emphysema: Screening for alpha-1 antitrypsin deficiency. Checking 6 minute walk test & spirometry with bronchodilator challenge and next appointment. Continuing on albuterol via nebulizer as needed. 2. GERD: Patient continuing on Prilosec. We'll controlled. 3. Allergic rhinitis: Recommended using over-the-counter low-dose Allegra as needed. 4. OSA: Continuing on nocturnal oxygen therapy at 2 L/m. Previously intolerant of CPAP. 5. Health maintenance: Patient reports she did receive influenza vaccine. 6. Follow-up: Patient to return to clinic in 6 months or sooner if needed.

## 2015-03-21 LAB — ALPHA-1 ANTITRYPSIN PHENOTYPE: A1 ANTITRYPSIN: 148 mg/dL (ref 83–199)

## 2015-03-23 ENCOUNTER — Encounter: Payer: Self-pay | Admitting: Family Medicine

## 2015-03-23 ENCOUNTER — Ambulatory Visit (INDEPENDENT_AMBULATORY_CARE_PROVIDER_SITE_OTHER): Payer: Medicare Other | Admitting: Family Medicine

## 2015-03-23 VITALS — BP 124/76 | HR 85 | Temp 98.7°F | Ht 61.0 in | Wt 122.0 lb

## 2015-03-23 DIAGNOSIS — N61 Mastitis without abscess: Secondary | ICD-10-CM | POA: Diagnosis not present

## 2015-03-23 MED ORDER — DOXYCYCLINE HYCLATE 100 MG PO TABS: 100.0000 mg | ORAL_TABLET | Freq: Two times a day (BID) | ORAL | Status: DC

## 2015-03-23 NOTE — Progress Notes (Signed)
Pre visit review using our clinic review tool, if applicable. No additional management support is needed unless otherwise documented below in the visit note. 

## 2015-03-23 NOTE — Progress Notes (Signed)
   Subjective:    Patient ID: [name] [name], female    DOB: 1923-06-27, 81 y.o.   MRN: [id number]  HPI Here for the onset 5 days ago of swelling and pain in the left breast. No nipple DC. No fever. She had a mammogram on 01-24-15 which was unremarkable and she had a normal exam with Dr. Lucia Gaskins some time after that. She is S/P lumpectomy on the right breast.    Review of Systems  Constitutional: Negative.        Objective:   Physical Exam  Constitutional: She appears well-developed and well-nourished.  Cardiovascular: Normal rate, regular rhythm, normal heart sounds and intact distal pulses.   Pulmonary/Chest: Effort normal and breath sounds normal.  The left breast has a firm mass in the center of the breast under the nipple which is quite tender. I can express some milky fluid from the nipple. No erythema or warmth. No axillary nodes           Assessment & Plan:  Mastitis, treat with Doxycycline. Use warm compresses prn. I will arrange for her to follow up with Dr. Lucia Gaskins next week.

## 2015-03-28 DIAGNOSIS — N63 Unspecified lump in breast: Secondary | ICD-10-CM | POA: Diagnosis not present

## 2015-03-28 DIAGNOSIS — C50911 Malignant neoplasm of unspecified site of right female breast: Secondary | ICD-10-CM | POA: Diagnosis not present

## 2015-04-02 ENCOUNTER — Other Ambulatory Visit: Payer: Self-pay | Admitting: Internal Medicine

## 2015-04-11 IMAGING — CT CT CHEST W/O CM
2 of 3 series · 15 of 36 positions shown, 18 images · IV contrast (Omnipaque 300)
Comparison: CT CHEST W/CM dated 03/14/2013; CT ABD-PEL WO/W CM dated
01/04/2013

CLINICAL DATA: Shortness of breath, weakness, prior abnormal CT
chest. History of right breast cancer.

EXAM:
CT CHEST WITHOUT CONTRAST
TECHNIQUE: Multidetector CT imaging of the chest was performed following the
standard protocol without IV contrast.

[Series 2: chest routine with · axial · 0.61mm/px · z∈[-290,-60]mm · 12 of 56 slices shown, 15 images]
[im 5/56  mediastinal]
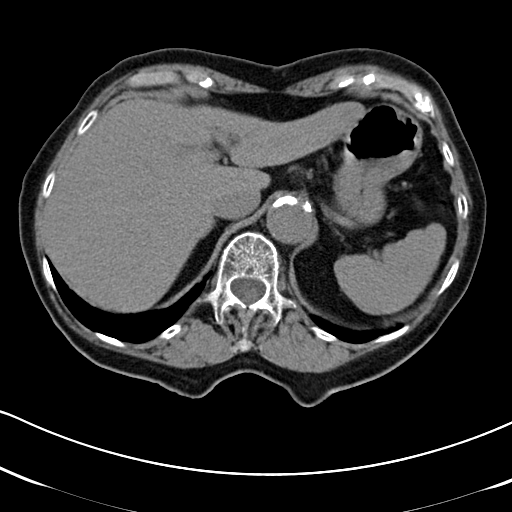
[im 5/56  lung]
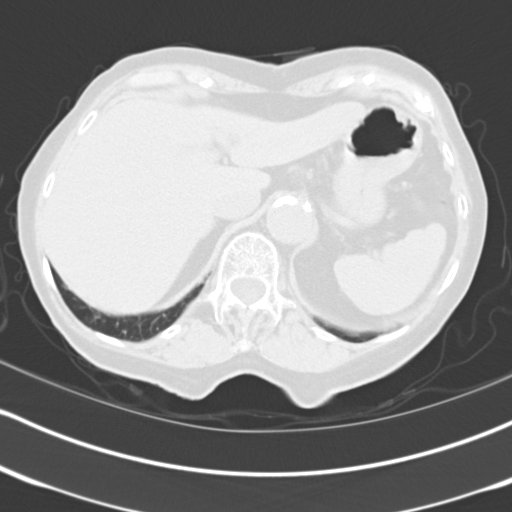
[im 9/56  lung]
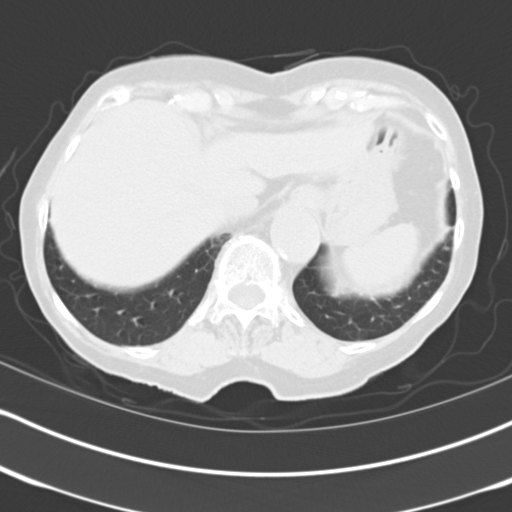
[im 13/56  lung]
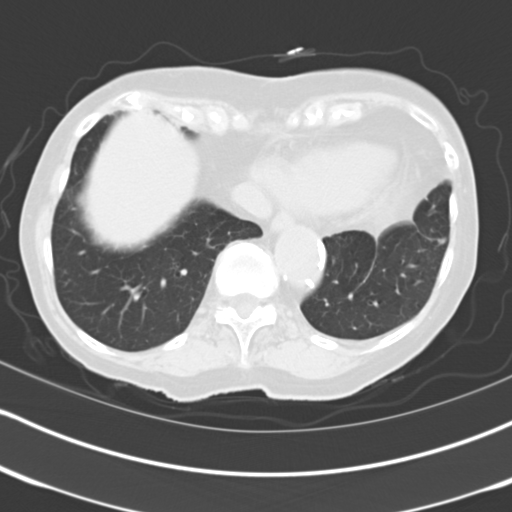
[im 17/56  lung]
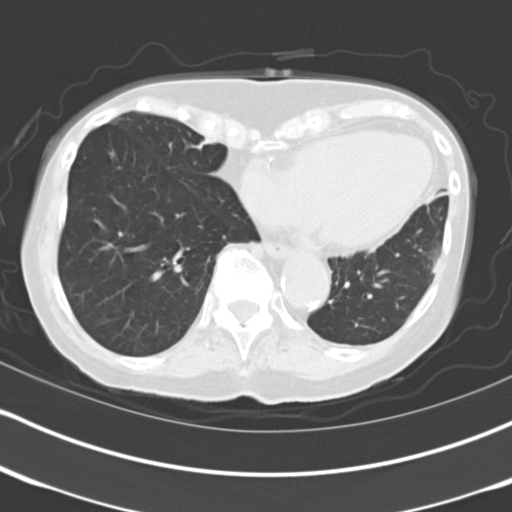
[im 21/56  mediastinal]
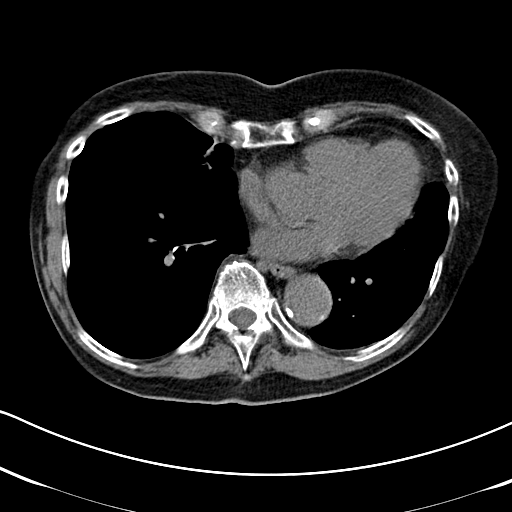
[im 21/56  lung]
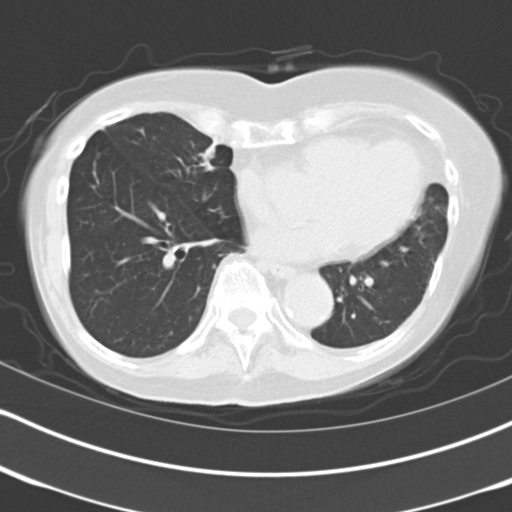
[im 25/56  lung]
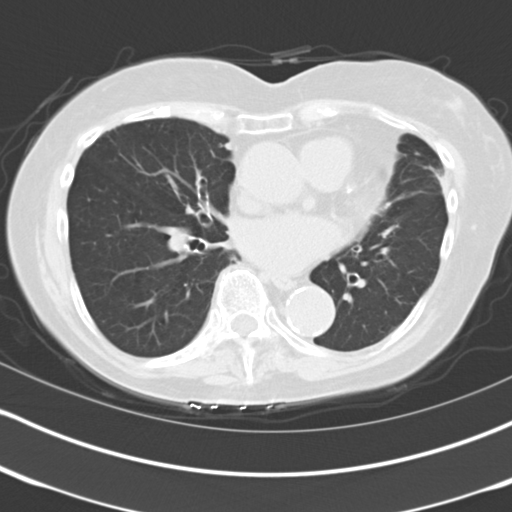
[im 31/56  lung]
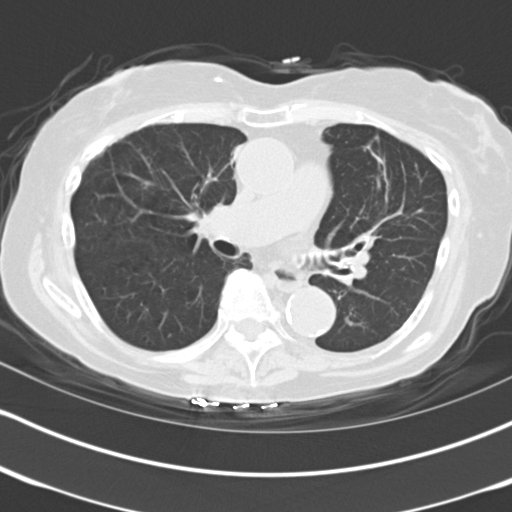
[im 35/56  lung]
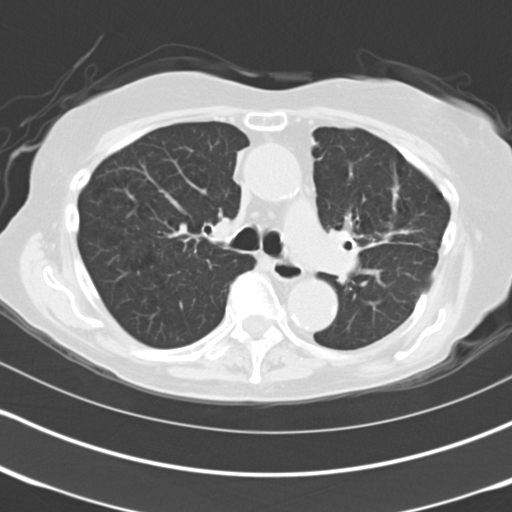
[im 39/56  mediastinal]
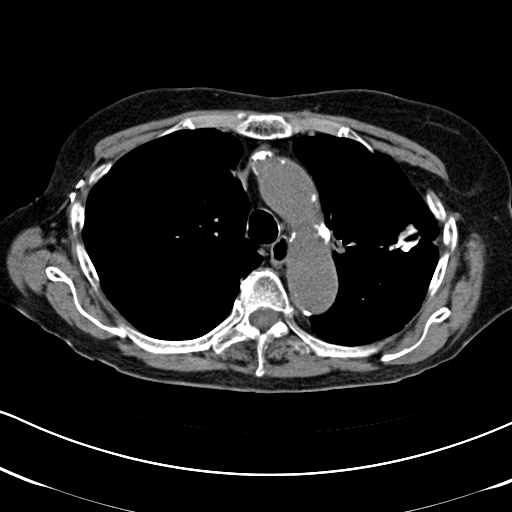
[im 39/56  lung]
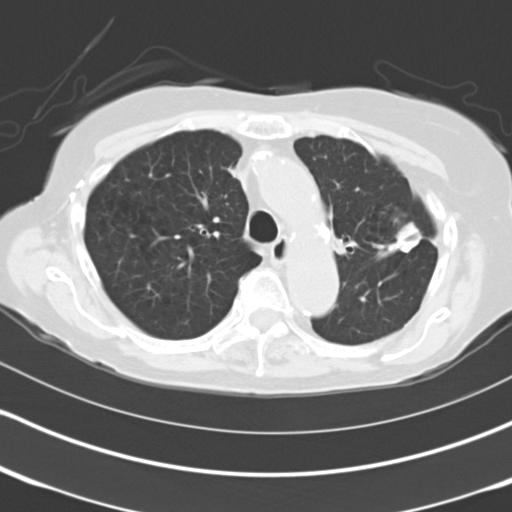
[im 43/56  lung]
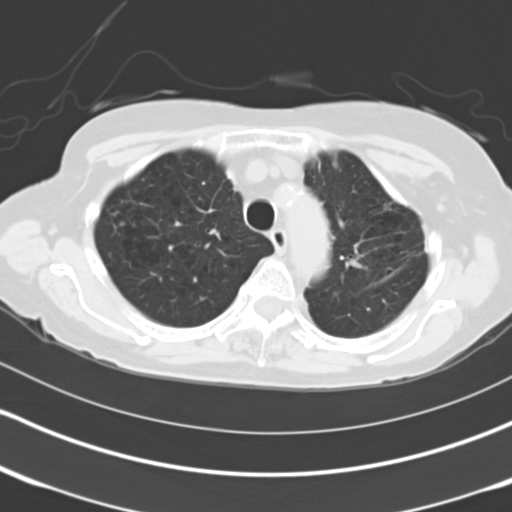
[im 47/56  lung]
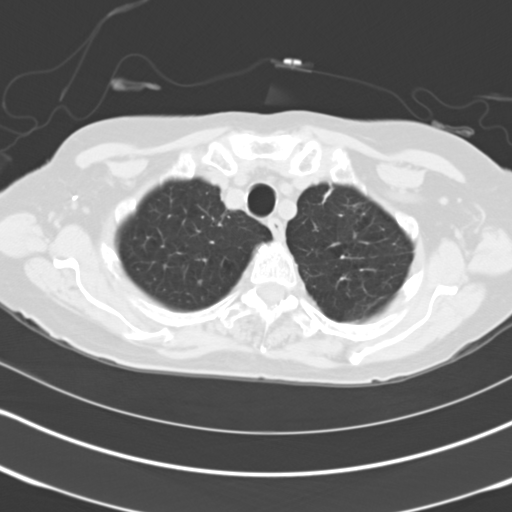
[im 51/56  lung]
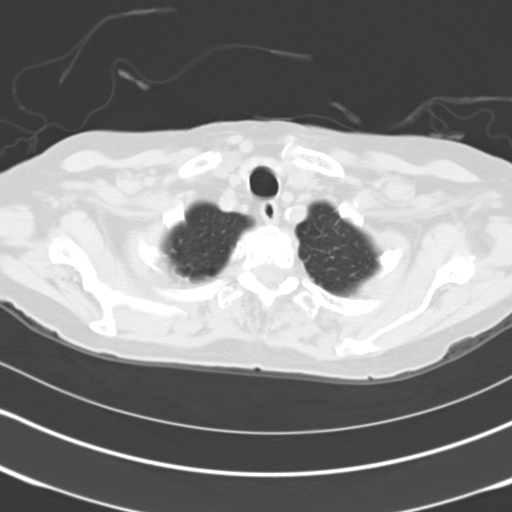

[Series 602: <mpr thick range> · coronal · 0.61mm/px · 3 of 102 slices shown]
[im 21/102  lung]
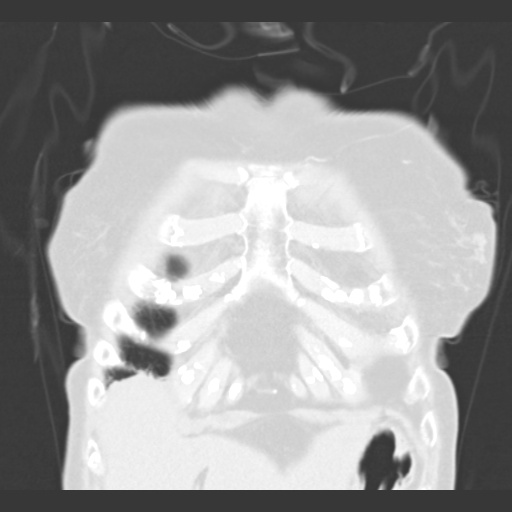
[im 41/102  lung]
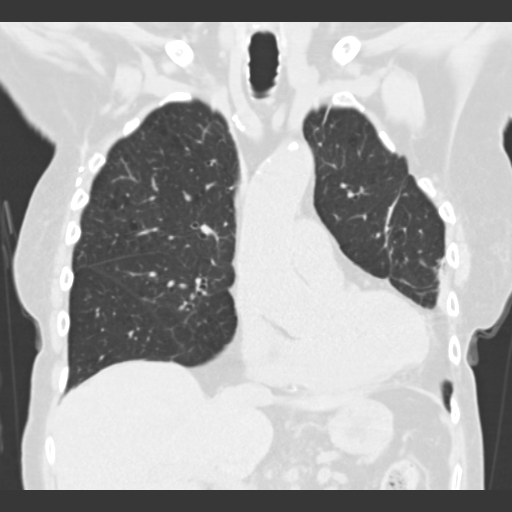
[im 61/102  lung]
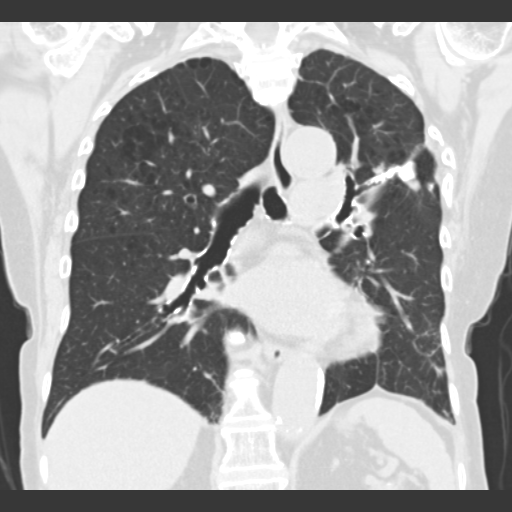

[15 of 36 positions shown; findings below may reference images not displayed]

FINDINGS: No pathologically enlarged mediastinal or axillary lymph nodes.
Surgical clips are seen in the right axilla. Hilar regions are
difficult to definitively evaluate without IV contrast but appear
grossly unremarkable. Heart size within normal limits. Coronary
artery calcification. No pericardial effusion.

Minimal biapical pleural parenchymal scarring. Moderate
centrilobular emphysema. Mild changes of subpleural radiation
fibrosis in the anterior right lung. Mild scarring in the right
middle lobe. Granulomatous calcification in the left upper lobe with
associated mild architectural distortion and volume loss. Scarring
in the lingula. Interval regression of a subpleural opacity in the
lateral aspect of the left lower lobe (image 39). There are a few
scattered tiny pulmonary nodular densities measuring 3 mm or less in
size. 3 mm right lower lobe nodule (example series 3, image 38) is
unchanged from 01/04/2013. Note is made that comparison with
03/14/2013 is difficult due to expiratory phase imaging on that
exam. Pleural fluid. Airway is unremarkable.

Incidental imaging of the upper abdomen shows the visualized
portions of the liver, adrenal glands, spleen, pancreas, stomach and
bowel to be grossly unremarkable. No upper abdominal adenopathy. No
worrisome lytic or sclerotic lesions. Degenerative changes are seen
in the spine. Pectus deformity.
IMPRESSION: 1. No acute findings to explain the patient's shortness of breath.
2. Coronary artery calcification.
3. Few scattered tiny pulmonary nodular densities measuring 3 mm or
less in size. Comparison with the prior examination is difficult due
to expiratory phase imaging on that exam. Given risk factors for
bronchogenic carcinoma, follow-up chest CT at 1 year is recommended.
This recommendation follows the consensus statement: Guidelines for
Management of Small Pulmonary Nodules Detected on CT Scans: A
Statement from the [HOSPITAL] as published in Radiology

## 2015-04-12 ENCOUNTER — Ambulatory Visit (INDEPENDENT_AMBULATORY_CARE_PROVIDER_SITE_OTHER): Payer: Medicare Other | Admitting: Internal Medicine

## 2015-04-12 ENCOUNTER — Encounter: Payer: Self-pay | Admitting: Internal Medicine

## 2015-04-12 VITALS — BP 110/64 | HR 73 | Ht 61.0 in | Wt 120.0 lb

## 2015-04-12 DIAGNOSIS — I10 Essential (primary) hypertension: Secondary | ICD-10-CM | POA: Diagnosis not present

## 2015-04-12 LAB — CBC
HEMATOCRIT: 39.3 % (ref 36.0–46.0)
Hemoglobin: 13.3 g/dL (ref 12.0–15.0)
MCH: 28.1 pg (ref 26.0–34.0)
MCHC: 33.8 g/dL (ref 30.0–36.0)
MCV: 82.9 fL (ref 78.0–100.0)
MPV: 10.6 fL (ref 8.6–12.4)
Platelets: 223 10*3/uL (ref 150–400)
RBC: 4.74 MIL/uL (ref 3.87–5.11)
RDW: 14 % (ref 11.5–15.5)
WBC: 6.6 10*3/uL (ref 4.0–10.5)

## 2015-04-12 LAB — BASIC METABOLIC PANEL
BUN: 13 mg/dL (ref 7–25)
CO2: 27 mmol/L (ref 20–31)
Calcium: 8.9 mg/dL (ref 8.6–10.4)
Chloride: 100 mmol/L (ref 98–110)
Creat: 0.8 mg/dL (ref 0.60–0.88)
Glucose, Bld: 96 mg/dL (ref 65–99)
POTASSIUM: 4.4 mmol/L (ref 3.5–5.3)
SODIUM: 135 mmol/L (ref 135–146)

## 2015-04-12 MED ORDER — NITROGLYCERIN 0.4 MG SL SUBL: 0.4000 mg | SUBLINGUAL_TABLET | SUBLINGUAL | Status: AC | PRN

## 2015-04-12 NOTE — Patient Instructions (Signed)
Your physician recommends that you continue on your current medications as directed. Please refer to the Current Medication list given to you today. Your physician recommends that you return for lab work today (BMET, CBC)  Your physician wants you to follow-up in: 8 months with Dr. Harrington Challenger.  You will receive a reminder letter in the mail two months in advance. If you don't receive a letter, please call our office to schedule the follow-up appointment.

## 2015-04-12 NOTE — Progress Notes (Signed)
Cardiology Office Note   Date:  04/12/2015   ID:  [name], DOB 08-27-1923, MRN [id number]  PCP:  Garret Reddish, MD  Cardiologist:   Dorris Carnes, MD   Chief Complaint  Patient presents with  . Follow-up   F/U of CAD    History of Present Illness: [name] [name] is a 81 y.o. female with a history of CAD, MVP, HTN, COPD (moderate), sleep apnea, GERD    I saw her in June 2016.  She had been admitted earlier in spring for CP  R/O    Felt possibly due to GI    Pt has intermitt episodes of CP / arm pain in middle of night  Wakes  Lasts a couple min then goes away    SOB with activity is unchanged      Current Outpatient Prescriptions  Medication Sig Dispense Refill  . albuterol (PROVENTIL) (2.5 MG/3ML) 0.083% nebulizer solution Take 3 mLs (2.5 mg total) by nebulization every 6 (six) hours as needed for wheezing or shortness of breath.    Marland Kitchen amLODipine (NORVASC) 2.5 MG tablet TAKE 1 TABLET BY MOUTH EVERY DAY FOR HTN 90 tablet 0  . aspirin 325 MG tablet Take 325 mg by mouth daily.    . diazepam (VALIUM) 5 MG tablet TAKE 1 TABLET AT BEDTIME 30 tablet 5  . fluticasone (FLONASE) 50 MCG/ACT nasal spray Place 2 sprays into both nostrils daily.    Marland Kitchen losartan (COZAAR) 50 MG tablet Take 50 mg by mouth at bedtime.     . nitroGLYCERIN (NITROSTAT) 0.4 MG SL tablet Place 0.4 mg under the tongue every 5 (five) minutes as needed for chest pain.    Marland Kitchen omeprazole (PRILOSEC) 20 MG capsule Take 1 capsule (20 mg total) by mouth daily. (Patient taking differently: Take 20 mg by mouth as needed. ) 90 capsule 1  . polyethylene glycol (MIRALAX / GLYCOLAX) packet Take 17 g by mouth daily as needed. For constipation    . senna (SENOKOT) 8.6 MG TABS tablet Take 1 tablet by mouth daily as needed (constipation). For constipation     No current facility-administered medications for this visit.    Allergies:   Azithromycin; Ciprofloxacin; Levaquin; Penicillins; Prednisone; and Sulfamethoxazole   Past Medical  History  Diagnosis Date  . ANXIETY 08/16/2008  . CAD, UNSPECIFIED SITE 10/17/2008  . CHOLELITHIASIS 08/23/2008  . COPD 01/04/2010    FeV1 101%, DLCO64% 2008  . 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 DIVERTICULOSIS-COLON 04/07/2008  . GERD 04/19/2007  . HYPERSOMNIA, ASSOCIATED WITH SLEEP APNEA 02/01/2007  . HYPERTENSION 11/12/2006  . MITRAL VALVE PROLAPSE, HX OF 10/07/2008  . NEOPLASM, MALIGNANT, BREAST, RIGHT 02/15/2008  . OSTEOPOROSIS 04/04/2008  . Raynaud's syndrome 04/25/2009  . Airway obstruction   . History of diverticulitis of colon   . Tortuous colon   . Anal stricture   . Chronic constipation   . Fatty liver   . Hiatal hernia   . Esophageal stricture   . IBS (irritable bowel syndrome)   . Pulmonary fibrosis ([place])   . Sleep apnea   . COPD (chronic obstructive pulmonary disease) ([place]) 01/04/2010  . Colitis     sigmoid  . Candida esophagitis ([place])   . Diverticulitis   . Hearing aid worn     bilateral  . Atrial fibrillation ([place])     she denies known hx of afib 02/24/13  . Complication of anesthesia     difficult intubation  . Allergic rhinitis   . Emphysema of  lung [facility])     Past Surgical History  Procedure Laterality Date  . Abdominal hysterectomy    . Bilateral salpingoophorectomy      s/p prolapsed bladder  . Bladder surgery      prolapsed  . Thoracotomy      granuloma, pulmonary-thoracotomy  . Breast surgery  07-21-07    mastectomy (right), all margins neg and LNs neg   . Appendectomy    . Tonsillectomy    . Rotator cuff repair    . Mastectomy      right  . Breast cyst excision      left  . Mouth surgery    . Eye surgery      cataract removal bilateral  . Melanoma excision      rt clavicle Dr [facility] office)  . Lung surgery       Social History:  The patient  reports that she quit smoking about 31 years ago. Her smoking use included Cigarettes. She has a 60 pack-year smoking history. She has never used smokeless tobacco. She reports that  she drinks alcohol. She reports that she does not use illicit drugs.   Family History:  The patient's family history includes Breast cancer in her maternal aunt and another family member; Colon cancer in her maternal aunt and another family member; Coronary artery disease in her father; Heart failure in her mother; Parkinsonism in her father; Stroke in her mother; Uterine cancer in her maternal aunt and another family member. There is no history of Lung disease.    ROS:  Please see the history of present illness. All other systems are reviewed and  Negative to the above problem except as noted.    PHYSICAL EXAM: VS:  There were no vitals taken for this visit.  GEN: Well nourished, well developed, in no acute distress HEENT: normal Neck: no JVD, carotid bruits, or masses Cardiac: RRR; no murmurs, rubs, or gallops,no edema  Respiratory:  clear to auscultation bilaterally, normal work of breathing GI: soft, nontender, nondistended, + BS  No hepatomegaly  MS: no deformity Moving all extremities   Skin: warm and dry, no rash Neuro:  Strength and sensation are intact Psych: euthymic mood, full affect   EKG:  EKG is ordered today.  SR 73 bpm  First degree AV block  PR 214 msec   IWMI.     Lipid Panel    Component Value Date/Time   CHOL 126 07/27/2013 0405   TRIG 87 07/27/2013 0405   HDL 25* 07/27/2013 0405   CHOLHDL 5.0 07/27/2013 0405   VLDL 17 07/27/2013 0405   LDLCALC 84 07/27/2013 0405   LDLDIRECT 138.7 12/29/2007 0817      Wt Readings from Last 3 Encounters:  03/23/15 55.339 kg (122 lb)  03/14/15 55.248 kg (121 lb 12.8 oz)  01/30/15 55.792 kg (123 lb)      ASSESSMENT AND PLAN:  1  CP  Pt with intermitt symptoms of CP / pressre/SOB / arm pain  Susp for angina  Does not want to have cath  I told her that she can take 1/2 to 1 NTG SL qd   If worsens I have asked pt to call clinic  WOuld consider changing medical regimen at that time     2.  HTN  BP is well  controlled   Check CBC and BMET   F/U in 8 months      Signed, Dorris Carnes, MD  04/12/2015 2:18 PM    [facility] [place], [place], [place]  [address] Phone: [phone]; Fax: [phone]

## 2015-05-09 ENCOUNTER — Other Ambulatory Visit: Payer: Self-pay | Admitting: Family Medicine

## 2015-05-17 ENCOUNTER — Other Ambulatory Visit: Payer: Self-pay | Admitting: Internal Medicine

## 2015-06-12 ENCOUNTER — Encounter: Payer: Self-pay | Admitting: Gastroenterology

## 2015-06-15 ENCOUNTER — Other Ambulatory Visit: Payer: Self-pay | Admitting: Internal Medicine

## 2015-06-25 ENCOUNTER — Ambulatory Visit (INDEPENDENT_AMBULATORY_CARE_PROVIDER_SITE_OTHER): Payer: Medicare Other | Admitting: Internal Medicine

## 2015-06-25 ENCOUNTER — Encounter: Payer: Self-pay | Admitting: Internal Medicine

## 2015-06-25 VITALS — BP 110/78 | Temp 98.5°F | Ht 61.0 in | Wt 125.0 lb

## 2015-06-25 DIAGNOSIS — N390 Urinary tract infection, site not specified: Secondary | ICD-10-CM

## 2015-06-25 DIAGNOSIS — R300 Dysuria: Secondary | ICD-10-CM | POA: Diagnosis not present

## 2015-06-25 DIAGNOSIS — R82998 Other abnormal findings in urine: Secondary | ICD-10-CM

## 2015-06-25 LAB — POC URINALSYSI DIPSTICK (AUTOMATED)
BILIRUBIN UA: NEGATIVE
GLUCOSE UA: NEGATIVE
Ketones, UA: NEGATIVE
NITRITE UA: NEGATIVE
Protein, UA: NEGATIVE
Spec Grav, UA: 1.01
Urobilinogen, UA: 0.2
pH, UA: 6

## 2015-06-25 MED ORDER — CEPHALEXIN 500 MG PO CAPS: 500.0000 mg | ORAL_CAPSULE | Freq: Three times a day (TID) | ORAL | Status: DC

## 2015-06-25 NOTE — Patient Instructions (Addendum)
  Take antibiotic  And will be contacted when culture results  back  don't use strong cortisone on the face .   Urinary Tract Infection Urinary tract infections (UTIs) can develop anywhere along your urinary tract. Your urinary tract is your body's drainage system for removing wastes and extra water. Your urinary tract includes two kidneys, two ureters, a bladder, and a urethra. Your kidneys are a pair of bean-shaped organs. Each kidney is about the size of your fist. They are located below your ribs, one on each side of your spine. CAUSES Infections are caused by microbes, which are microscopic organisms, including fungi, viruses, and bacteria. These organisms are so small that they can only be seen through a microscope. Bacteria are the microbes that most commonly cause UTIs. SYMPTOMS  Symptoms of UTIs may vary by age and gender of the patient and by the location of the infection. Symptoms in young women typically include a frequent and intense urge to urinate and a painful, burning feeling in the bladder or urethra during urination. Older women and men are more likely to be tired, shaky, and weak and have muscle aches and abdominal pain. A fever may mean the infection is in your kidneys. Other symptoms of a kidney infection include pain in your back or sides below the ribs, nausea, and vomiting. DIAGNOSIS To diagnose a UTI, your caregiver will ask you about your symptoms. Your caregiver will also ask you to provide a urine sample. The urine sample will be tested for bacteria and white blood cells. White blood cells are made by your body to help fight infection. TREATMENT  Typically, UTIs can be treated with medication. Because most UTIs are caused by a bacterial infection, they usually can be treated with the use of antibiotics. The choice of antibiotic and length of treatment depend on your symptoms and the type of bacteria causing your infection. HOME CARE INSTRUCTIONS  If you were prescribed  antibiotics, take them exactly as your caregiver instructs you. Finish the medication even if you feel better after you have only taken some of the medication.  Drink enough water and fluids to keep your urine clear or pale yellow.  Avoid caffeine, tea, and carbonated beverages. They tend to irritate your bladder.  Empty your bladder often. Avoid holding urine for long periods of time.  Empty your bladder before and after sexual intercourse.  After a bowel movement, women should cleanse from front to back. Use each tissue only once. SEEK MEDICAL CARE IF:   You have back pain.  You develop a fever.  Your symptoms do not begin to resolve within 3 days. SEEK IMMEDIATE MEDICAL CARE IF:   You have severe back pain or lower abdominal pain.  You develop chills.  You have nausea or vomiting.  You have continued burning or discomfort with urination. MAKE SURE YOU:   Understand these instructions.  Will watch your condition.  Will get help right away if you are not doing well or get worse.   This information is not intended to replace advice given to you by your health care provider. Make sure you discuss any questions you have with your health care provider.   Document Released: 02/05/2005 Document Revised: 01/17/2015 Document Reviewed: 06/06/2011 Elsevier Interactive Patient Education [employer].

## 2015-06-25 NOTE — Progress Notes (Signed)
Pre visit review using our clinic review tool, if applicable. No additional management support is needed unless otherwise documented below in the visit note.  Chief Complaint  Patient presents with  . Dysuria    HPI: Patient [name] [name]  comes in today for SDA for  new problem evaluation. PCP NA... 1 days hs of fullness and chill  From pain  And dysuria   .   Feels like  usual uti  Has itchy area on back  Using lidex on area right nose  Irritated  No fever . Allergies are an issue sees pulmonary   Can take keflex   Last time in fall.  ROS: See pertinent positives and negatives per HPI.  Past Medical History  Diagnosis Date  . ANXIETY 08/16/2008  . CAD, UNSPECIFIED SITE 10/17/2008  . CHOLELITHIASIS 08/23/2008  . COPD 01/04/2010    FeV1 101%, DLCO64% 2008  . 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 DIVERTICULOSIS-COLON 04/07/2008  . GERD 04/19/2007  . HYPERSOMNIA, ASSOCIATED WITH SLEEP APNEA 02/01/2007  . HYPERTENSION 11/12/2006  . MITRAL VALVE PROLAPSE, HX OF 10/07/2008  . NEOPLASM, MALIGNANT, BREAST, RIGHT 02/15/2008  . OSTEOPOROSIS 04/04/2008  . Raynaud's syndrome 04/25/2009  . Airway obstruction   . History of diverticulitis of colon   . Tortuous colon   . Anal stricture   . Chronic constipation   . Fatty liver   . Hiatal hernia   . Esophageal stricture   . IBS (irritable bowel syndrome)   . Pulmonary fibrosis ([place])   . Sleep apnea   . COPD (chronic obstructive pulmonary disease) ([place]) 01/04/2010  . Colitis     sigmoid  . Candida esophagitis ([place])   . Diverticulitis   . Hearing aid worn     bilateral  . Atrial fibrillation ([place])     she denies known hx of afib 02/24/13  . Complication of anesthesia     difficult intubation  . Allergic rhinitis   . Emphysema of lung ([place])     Family History  Problem Relation Age of Onset  . Parkinsonism Father   . Coronary artery disease Father   . Breast cancer      aunt  . Colon cancer      aunt, age 73  . Uterine  cancer      aunt  . Stroke Mother   . Heart failure Mother   . Breast cancer Maternal Aunt   . Colon cancer Maternal Aunt   . Uterine cancer Maternal Aunt   . Lung disease Neg Hx     Social History   Social History  . Marital Status: Widowed    Spouse Name: N/A  . Number of Children: 3  . Years of Education: N/A   Occupational History  . retireed    Social History Main Topics  . Smoking status: Former Smoker -- 2.00 packs/day for 30 years    Types: Cigarettes    Quit date: 05/13/1983  . Smokeless tobacco: Never Used  . Alcohol Use: 0.0 oz/week    0 Standard drinks or equivalent per week     Comment: 1-2 glaases wine per day  . Drug Use: No  . Sexual Activity: No   Other Topics Concern  . None   Social History Narrative   Living in assisted living at [facility]. Widowed 1991. 3 children. 7 grandkids. No greatgrandkids.       Walks down for her meals with a walker.    ADL-bath, clothe, cleaning  IADL-daughter does finances      HCPOA- daughter Janan Ridge   Full code for now- have counseled and advised consider DNR/DNI   States enjoys living and would want resuscitation currently      [facility]:   Patient is originally from [place]. Has traveled to nearly ever one of the [place] states. She has prior travel to [place] & the [place]. Previously enjoyed gardening. 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 Remote bird exposure to a canary as a child. Previously had bird houses in her yard that she cleaned out.     Outpatient Prescriptions Prior to Visit  Medication Sig Dispense Refill  . albuterol (PROVENTIL) (2.5 MG/3ML) 0.083% nebulizer solution Take 3 mLs (2.5 mg total) by nebulization every 6 (six) hours as needed for wheezing or shortness of breath.    Marland Kitchen amLODipine (NORVASC) 2.5 MG tablet TAKE 1 TABLET BY MOUTH EVERY DAY FOR HTN 90 tablet 2  . aspirin 325 MG tablet Take 325 mg by mouth daily.    . diazepam (VALIUM) 5 MG tablet  TAKE 1 TABLET AT BEDTIME 30 tablet 5  . fluticasone (FLONASE) 50 MCG/ACT nasal spray Place 2 sprays into both nostrils daily.    Marland Kitchen losartan (COZAAR) 50 MG tablet Take 50 mg by mouth at bedtime.     . nitroGLYCERIN (NITROSTAT) 0.4 MG SL tablet Place 1 tablet (0.4 mg total) under the tongue every 5 (five) minutes as needed for chest pain (take as directed). 25 tablet 3  . omeprazole (PRILOSEC) 20 MG capsule Take 1 capsule (20 mg total) by mouth daily. (Patient taking differently: Take 20 mg by mouth as needed. ) 90 capsule 1  . omeprazole (PRILOSEC) 20 MG capsule TAKE ONE CAPSULE BY MOUTH EVERY DAY 30 capsule 11  . polyethylene glycol (MIRALAX / GLYCOLAX) packet Take 17 g by mouth daily as needed. For constipation    . polyethylene glycol powder (GLYCOLAX/MIRALAX) powder TAKE DAILY AS DIRECTED 527 g 5  . senna (SENOKOT) 8.6 MG TABS tablet Take 1 tablet by mouth daily as needed (constipation). For constipation     No facility-administered medications prior to visit.     EXAM:  BP 110/78 mmHg  Temp(Src) 98.5 F (36.9 C) (Oral)  Ht 5\' 1"  (1.549 m)  Wt 125 lb (56.7 kg)  BMI 23.63 kg/m2  Body mass index is 23.63 kg/(m^2).  GENERAL: vitals reviewed and listed above, alert, oriented, appears well hydrated and in no acute distress forgot hearing aids   Hard of hearing  Daughter here  HEENT: atraumatic, conjunctiva  clear, no obvious abnormalities on inspection of external nose and ears   No cva tenderness  Skin multiple sks  No acute findings back  Slight irritation right nostril  MS: moves all extremities without noticeable focal  Abnormality djd changes  PSYCH: pleasant and cooperative,    ASSESSMENT AND PLAN:  Discussed the following assessment and plan:  Dysuria - Plan: POCT Urinalysis Dipstick (Automated), Urine culture  Leukocytes in urine - Plan: Urine culture  Urinary tract infection, site not specified  -Patient advised to return or notify health care team  if symptoms worsen  ,persist or new concerns arise.  Patient Instructions   Take antibiotic  And will be contacted when culture results  back  don't use strong cortisone on the face .   Urinary Tract Infection Urinary tract infections (UTIs) can develop anywhere along your urinary tract. Your urinary tract is your body's drainage  system for removing wastes and extra water. Your urinary tract includes two kidneys, two ureters, a bladder, and a urethra. Your kidneys are a pair of bean-shaped organs. Each kidney is about the size of your fist. They are located below your ribs, one on each side of your spine. CAUSES Infections are caused by microbes, which are microscopic organisms, including fungi, viruses, and bacteria. These organisms are so small that they can only be seen through a microscope. Bacteria are the microbes that most commonly cause UTIs. SYMPTOMS  Symptoms of UTIs may vary by age and gender of the patient and by the location of the infection. Symptoms in young women typically include a frequent and intense urge to urinate and a painful, burning feeling in the bladder or urethra during urination. Older women and men are more likely to be tired, shaky, and weak and have muscle aches and abdominal pain. A fever may mean the infection is in your kidneys. Other symptoms of a kidney infection include pain in your back or sides below the ribs, nausea, and vomiting. DIAGNOSIS To diagnose a UTI, your caregiver will ask you about your symptoms. Your caregiver will also ask you to provide a urine sample. The urine sample will be tested for bacteria and white blood cells. White blood cells are made by your body to help fight infection. TREATMENT  Typically, UTIs can be treated with medication. Because most UTIs are caused by a bacterial infection, they usually can be treated with the use of antibiotics. The choice of antibiotic and length of treatment depend on your symptoms and the type of bacteria causing your  infection. HOME CARE INSTRUCTIONS  If you were prescribed antibiotics, take them exactly as your caregiver instructs you. Finish the medication even if you feel better after you have only taken some of the medication.  Drink enough water and fluids to keep your urine clear or pale yellow.  Avoid caffeine, tea, and carbonated beverages. They tend to irritate your bladder.  Empty your bladder often. Avoid holding urine for long periods of time.  Empty your bladder before and after sexual intercourse.  After a bowel movement, women should cleanse from front to back. Use each tissue only once. SEEK MEDICAL CARE IF:   You have back pain.  You develop a fever.  Your symptoms do not begin to resolve within 3 days. SEEK IMMEDIATE MEDICAL CARE IF:   You have severe back pain or lower abdominal pain.  You develop chills.  You have nausea or vomiting.  You have continued burning or discomfort with urination. MAKE SURE YOU:   Understand these instructions.  Will watch your condition.  Will get help right away if you are not doing well or get worse.   This information is not intended to replace advice given to you by your health care provider. Make sure you discuss any questions you have with your health care provider.   Document Released: 02/05/2005 Document Revised: 01/17/2015 Document Reviewed: 06/06/2011 Elsevier Interactive Patient Education 2016 [place] K. Durante Violett M.D.

## 2015-06-28 LAB — URINE CULTURE

## 2015-06-28 NOTE — Progress Notes (Signed)
Quick Note:  Tell patient that urine culture showsklebsiella sensitive to medication given . Should resolve with current treatment .FU if not better. ______

## 2015-07-04 ENCOUNTER — Encounter: Payer: Self-pay | Admitting: Family Medicine

## 2015-07-17 ENCOUNTER — Other Ambulatory Visit: Payer: Self-pay | Admitting: Family Medicine

## 2015-07-17 NOTE — Telephone Encounter (Signed)
Yes thanks 

## 2015-07-17 NOTE — Telephone Encounter (Signed)
Refill ok? 

## 2015-07-20 ENCOUNTER — Other Ambulatory Visit: Payer: Self-pay | Admitting: Family Medicine

## 2015-07-21 NOTE — Telephone Encounter (Signed)
Refill ok? 

## 2015-07-22 NOTE — Telephone Encounter (Signed)
No needs office visit. Will need urine culture

## 2015-07-23 ENCOUNTER — Encounter: Payer: Self-pay | Admitting: Family Medicine

## 2015-07-23 ENCOUNTER — Ambulatory Visit (INDEPENDENT_AMBULATORY_CARE_PROVIDER_SITE_OTHER): Payer: Medicare Other | Admitting: Family Medicine

## 2015-07-23 VITALS — BP 126/70 | HR 78 | Temp 97.7°F | Resp 18 | Ht 61.0 in | Wt 127.0 lb

## 2015-07-23 DIAGNOSIS — R300 Dysuria: Secondary | ICD-10-CM | POA: Diagnosis not present

## 2015-07-23 DIAGNOSIS — N3000 Acute cystitis without hematuria: Secondary | ICD-10-CM

## 2015-07-23 DIAGNOSIS — K219 Gastro-esophageal reflux disease without esophagitis: Secondary | ICD-10-CM

## 2015-07-23 LAB — POC URINALSYSI DIPSTICK (AUTOMATED)
BILIRUBIN UA: NEGATIVE
Blood, UA: NEGATIVE
Glucose, UA: NEGATIVE
Ketones, UA: NEGATIVE
NITRITE UA: NEGATIVE
PH UA: 5.5
Protein, UA: NEGATIVE
Spec Grav, UA: 1.015
Urobilinogen, UA: 0.2

## 2015-07-23 MED ORDER — CEPHALEXIN 500 MG PO CAPS: 500.0000 mg | ORAL_CAPSULE | Freq: Three times a day (TID) | ORAL | Status: DC

## 2015-07-23 NOTE — Progress Notes (Signed)
Garret Reddish, MD  Subjective:  [name] [name] is a 82 y.o. year old very pleasant female patient who presents for/with See problem oriented charting ROS- some suprapubic pain. No fevers or chills. No CVA tenderness. Not systemically ill. No nausea.  Past Medical History-  Patient Active Problem List   Diagnosis Date Noted  . History of CVA (cerebrovascular accident) 07/29/2013    Priority: High  . Pulmonary nodules 07/29/2013    Priority: High  . COPD with emphysema Gold B 01/04/2010    Priority: High  . Memory loss 04/12/2014    Priority: Medium  . Insomnia 11/21/2013    Priority: Medium  . Recurrent UTI 07/13/2013    Priority: Medium  . History of breast cancer, T1b, N0, Lumpectomy 07/21/2007. 04/21/2011    Priority: Medium  . Essential hypertension 11/12/2006    Priority: Medium  . Allergic rhinitis 04/12/2014    Priority: Low  . Constipation 04/12/2014    Priority: Low  . Raynaud's syndrome 04/25/2009    Priority: Low  . GERD 04/19/2007    Priority: Low  . Chest pain with moderate risk for cardiac etiology 07/25/2014  . Osteoarthritis, knee 04/12/2014    Medications- reviewed and updated Current Outpatient Prescriptions  Medication Sig Dispense Refill  . albuterol (PROVENTIL) (2.5 MG/3ML) 0.083% nebulizer solution Take 3 mLs (2.5 mg total) by nebulization every 6 (six) hours as needed for wheezing or shortness of breath.    Marland Kitchen amLODipine (NORVASC) 2.5 MG tablet TAKE 1 TABLET BY MOUTH EVERY DAY FOR HTN 90 tablet 2  . aspirin 325 MG tablet Take 325 mg by mouth daily.    . diazepam (VALIUM) 5 MG tablet TAKE 1 TABLET AT BEDTIME 30 tablet 5  . fluticasone (FLONASE) 50 MCG/ACT nasal spray Place 2 sprays into both nostrils daily.    Marland Kitchen losartan (COZAAR) 50 MG tablet Take 50 mg by mouth at bedtime.     . nitroGLYCERIN (NITROSTAT) 0.4 MG SL tablet Place 1 tablet (0.4 mg total) under the tongue every 5 (five) minutes as needed for chest pain (take as directed). 25 tablet 3  .  polyethylene glycol powder (GLYCOLAX/MIRALAX) powder TAKE DAILY AS DIRECTED 527 g 5  . senna (SENOKOT) 8.6 MG TABS tablet Take 1 tablet by mouth daily as needed (constipation). For constipation    . cephALEXin (KEFLEX) 500 MG capsule Take 1 capsule (500 mg total) by mouth 3 (three) times daily. uti 21 capsule 0   No current facility-administered medications for this visit.    Objective: BP 126/70 mmHg  Pulse 78  Temp(Src) 97.7 F (36.5 C) (Oral)  Resp 18  Ht 5\' 1"  (1.549 m)  Wt 127 lb (57.607 kg)  BMI 24.01 kg/m2  SpO2 96% Gen: NAD, resting comfortably, hard of hearing- speaks loudly CV: RRR no murmurs rubs or gallops Lungs: CTAB no crackles, wheeze, rhonchi Abdomen: soft/Mild suprapubic pain/nondistended/normal bowel sounds. No rebound or guarding.  Ext: no edema Skin: warm, dry Neuro: grossly normal, moves all extremities  Assessment/Plan:  GERD S: Has been on omeprazole for many years. Trial off and has not had any adverse reaction. Wonders if she can do this long-term A/P: We discontinued omeprazole from her medication list and advised her to avoid. He'll be preferable if she would take Tums for when necessary symptoms and if really needed Zantac.  UTI  S:since Friday- Dysuria, polyuria, incontinence. Took 1 doxycyline that she had left over from a prior prescription and felt slightly better. She's been drinking cranberry juice and  that seems to help as well A/P: High concern for UTI. We'll send for urine culture given age. Also, given by fact she took the doxycycline. Encouraged her never to take  antibiotic medicine unless advised by physician. Will treat with Keflex for 7 days while awaiting culture. This is an acute issue. She cleared prior urinary tract infection completely. She has had UTIs in the past and prophylaxis could be considered. Will monitor for next recurrence.   Return precautions advised.   Orders Placed This Encounter  Procedures  . Urine culture  .  POCT Urinalysis Dipstick (Automated)    Meds ordered this encounter  Medications  . cephALEXin (KEFLEX) 500 MG capsule    Sig: Take 1 capsule (500 mg total) by mouth 3 (three) times daily. uti    Dispense:  21 capsule    Refill:  0

## 2015-07-23 NOTE — Patient Instructions (Addendum)
Stop prilosec/omeprazole  Schedule a visit within the next 2 months for memory evaluation- we may do some basic lab tests and an MRI before sending to neurology depending on findings. For check out desk- make sure this is a 30 minute slot  Sent in keflex/cehalexin. If recurrent issues please let me know. Also let us know immediately if fever, worsening symptoms, feeling overall sick

## 2015-07-23 NOTE — Progress Notes (Signed)
Pre visit review using our clinic review tool, if applicable. No additional management support is needed unless otherwise documented below in the visit note. 

## 2015-07-23 NOTE — Assessment & Plan Note (Signed)
S: Has been on omeprazole for many years. Trial off and has not had any adverse reaction. Wonders if she can do this long-term A/P: We discontinued omeprazole from her medication list and advised her to avoid. He'll be preferable if she would take Tums for when necessary symptoms and if really needed Zantac.

## 2015-07-25 LAB — URINE CULTURE
Colony Count: NO GROWTH
ORGANISM ID, BACTERIA: NO GROWTH

## 2015-08-20 ENCOUNTER — Ambulatory Visit (INDEPENDENT_AMBULATORY_CARE_PROVIDER_SITE_OTHER): Payer: Medicare Other | Admitting: Family Medicine

## 2015-08-20 ENCOUNTER — Encounter: Payer: Self-pay | Admitting: Family Medicine

## 2015-08-20 VITALS — BP 100/74 | HR 89 | Temp 98.3°F | Wt 127.0 lb

## 2015-08-20 DIAGNOSIS — Z20828 Contact with and (suspected) exposure to other viral communicable diseases: Secondary | ICD-10-CM | POA: Diagnosis not present

## 2015-08-20 DIAGNOSIS — R300 Dysuria: Secondary | ICD-10-CM | POA: Diagnosis not present

## 2015-08-20 DIAGNOSIS — R413 Other amnesia: Secondary | ICD-10-CM

## 2015-08-20 LAB — CBC WITH DIFFERENTIAL/PLATELET
Basophils Absolute: 0 10*3/uL (ref 0.0–0.1)
Basophils Relative: 0.5 % (ref 0.0–3.0)
EOS PCT: 2.7 % (ref 0.0–5.0)
Eosinophils Absolute: 0.2 10*3/uL (ref 0.0–0.7)
HCT: 41.4 % (ref 36.0–46.0)
HEMOGLOBIN: 13.6 g/dL (ref 12.0–15.0)
Lymphocytes Relative: 24.7 % (ref 12.0–46.0)
Lymphs Abs: 1.7 10*3/uL (ref 0.7–4.0)
MCHC: 32.9 g/dL (ref 30.0–36.0)
MCV: 86.9 fl (ref 78.0–100.0)
MONOS PCT: 11.3 % (ref 3.0–12.0)
Monocytes Absolute: 0.8 10*3/uL (ref 0.1–1.0)
Neutro Abs: 4.2 10*3/uL (ref 1.4–7.7)
Neutrophils Relative %: 60.8 % (ref 43.0–77.0)
Platelets: 232 10*3/uL (ref 150.0–400.0)
RBC: 4.76 Mil/uL (ref 3.87–5.11)
RDW: 15.1 % (ref 11.5–15.5)
WBC: 6.9 10*3/uL (ref 4.0–10.5)

## 2015-08-20 LAB — COMPREHENSIVE METABOLIC PANEL
ALBUMIN: 4 g/dL (ref 3.5–5.2)
ALK PHOS: 65 U/L (ref 39–117)
ALT: 8 U/L (ref 0–35)
AST: 13 U/L (ref 0–37)
BUN: 13 mg/dL (ref 6–23)
CO2: 26 mEq/L (ref 19–32)
Calcium: 9.3 mg/dL (ref 8.4–10.5)
Chloride: 101 mEq/L (ref 96–112)
Creatinine, Ser: 0.82 mg/dL (ref 0.40–1.20)
GFR: 69.65 mL/min (ref >60.00)
Glucose, Bld: 93 mg/dL (ref 70–99)
Potassium: 4.8 mEq/L (ref 3.5–5.1)
SODIUM: 139 meq/L (ref 135–145)
TOTAL PROTEIN: 6.7 g/dL (ref 6.0–8.3)
Total Bilirubin: 0.6 mg/dL (ref 0.2–1.2)

## 2015-08-20 LAB — VITAMIN B12: Vitamin B-12: 260 pg/mL (ref 211–911)

## 2015-08-20 LAB — TSH: TSH: 1.73 u[IU]/mL (ref 0.35–4.50)

## 2015-08-20 NOTE — Patient Instructions (Addendum)
We will call you within a week about your referral to neurology. If you do not hear within 2 weeks, give [place] a call. It may take 1-2 months to get in.   Labs before you leave. HIV was not covered by medicare so we did not order this.

## 2015-08-20 NOTE — Addendum Note (Signed)
Addended by: Clyde Lundborg A on: 08/20/2015 03:04 PM   Modules accepted: Orders

## 2015-08-20 NOTE — Assessment & Plan Note (Addendum)
S: MMSE 05/24/14 of 28/30. Daughter at last visit expressed concern for worsening. MMSE today of 24/30 (previously lost 1 point for date and clock draw. Today additionally missed 1/3 delayed recall, 3/5 lost for spelling world backwards.  A/P: Suspect alzheimer's dementia as cause. Gradual progression with MMSE down from 28 to 24 over a year. PHQ2 of 0, will get labs (CBC, CMET, RPR, TSH, B12). HIV not covered by medicare but likely low yield as is RPR. Normal neuro exam- no stepwise decline and doubt stroke as cause. Diazepam certainly does not help- patient adamant cannot sleep without this though. Discussed continued workup here vs. Refer to neurology. Daughter prefers neurology and referred at this time.

## 2015-08-20 NOTE — Progress Notes (Signed)
Garret Reddish, MD  Subjective:  [name] [name] is a 82 y.o. year old very pleasant female patient who presents for/with See problem oriented charting ROS- no headache, no blurry vision, no extremity weakness, no trouble swallowing  Past Medical History-  Patient Active Problem List   Diagnosis Date Noted  . History of CVA (cerebrovascular accident) 07/29/2013    Priority: High  . Pulmonary nodules 07/29/2013    Priority: High  . COPD with emphysema Gold B 01/04/2010    Priority: High  . Memory loss 04/12/2014    Priority: Medium  . Insomnia 11/21/2013    Priority: Medium  . Recurrent UTI 07/13/2013    Priority: Medium  . History of breast cancer, T1b, N0, Lumpectomy 07/21/2007. 04/21/2011    Priority: Medium  . Essential hypertension 11/12/2006    Priority: Medium  . Allergic rhinitis 04/12/2014    Priority: Low  . Constipation 04/12/2014    Priority: Low  . Raynaud's syndrome 04/25/2009    Priority: Low  . GERD 04/19/2007    Priority: Low  . Chest pain with moderate risk for cardiac etiology 07/25/2014  . Osteoarthritis, knee 04/12/2014    Medications- reviewed and updated Current Outpatient Prescriptions  Medication Sig Dispense Refill  . amLODipine (NORVASC) 2.5 MG tablet TAKE 1 TABLET BY MOUTH EVERY DAY FOR HTN 90 tablet 2  . aspirin 325 MG tablet Take 325 mg by mouth daily.    . diazepam (VALIUM) 5 MG tablet TAKE 1 TABLET AT BEDTIME 30 tablet 5  . fluticasone (FLONASE) 50 MCG/ACT nasal spray Place 2 sprays into both nostrils daily.    Marland Kitchen losartan (COZAAR) 50 MG tablet Take 50 mg by mouth at bedtime.     . nitroGLYCERIN (NITROSTAT) 0.4 MG SL tablet Place 1 tablet (0.4 mg total) under the tongue every 5 (five) minutes as needed for chest pain (take as directed). (Patient not taking: Reported on 08/20/2015) 25 tablet 3   No current facility-administered medications for this visit.    Objective: BP 100/74 mmHg  Pulse 89  Temp(Src) 98.3 F (36.8 C)  Wt 127 lb (57.607  kg) Gen: NAD, resting comfortably CV: RRR no murmurs rubs or gallops Lungs: CTAB no crackles, wheeze, rhonchi Ext: no edema Skin: warm, dry Neuro: CN II-XII intact except hard of hearing, sensation and reflexes normal throughout, 5/5 muscle strength in bilateral upper and lower extremities. Normal finger to nose. Normal rapid alternating movements. No pronator drift. Normal romberg. Normal gait.   Assessment/Plan:  Memory loss S: MMSE 05/24/14 of 28/30. Daughter at last visit expressed concern for worsening. MMSE today of 24/30 (previously lost 1 point for date and clock draw. Today additionally missed 1/3 delayed recall, 3/5 lost for spelling world backwards.  A/P: PHQ2 of 0, will get labs (CBC, CMET, RPR, TSH, B12). HIV not covered by medicare but likely low yield as is RPR. Normal neuro exam- no stepwise decline and doubt stroke as cause. Diazepam certainly does not help- patient adamant cannot sleep without this though. Discussed continued workup here vs. Refer to neurology. Daughter prefers neurology and referred at this time.   Orders Placed This Encounter  Procedures  . CBC with Differential/Platelet  . Comprehensive metabolic panel    [facility]  . RPR    [facility]  . TSH    [facility]  . Vitamin B12  . Ambulatory referral to Neurology    Referral Priority:  Routine    Referral Type:  Consultation    Referral Reason:  Specialty Services Required  Requested Specialty:  Neurology    Number of Visits Requested:  1   The duration of face-to-face time during this visit was 25 minutes. Greater than 50% of this time was spent in counseling, explanation of diagnosis, planning of further management, and/or coordination of care.

## 2015-08-21 LAB — RPR

## 2015-08-22 LAB — URINE CULTURE

## 2015-08-31 ENCOUNTER — Ambulatory Visit: Payer: Medicare Other | Admitting: Neurology

## 2015-09-12 DIAGNOSIS — D1801 Hemangioma of skin and subcutaneous tissue: Secondary | ICD-10-CM | POA: Diagnosis not present

## 2015-09-12 DIAGNOSIS — L603 Nail dystrophy: Secondary | ICD-10-CM | POA: Diagnosis not present

## 2015-09-12 DIAGNOSIS — L821 Other seborrheic keratosis: Secondary | ICD-10-CM | POA: Diagnosis not present

## 2015-09-12 DIAGNOSIS — L814 Other melanin hyperpigmentation: Secondary | ICD-10-CM | POA: Diagnosis not present

## 2015-09-14 ENCOUNTER — Ambulatory Visit: Payer: Medicare Other | Admitting: Neurology

## 2015-09-19 ENCOUNTER — Other Ambulatory Visit: Payer: Self-pay | Admitting: Family Medicine

## 2015-09-21 ENCOUNTER — Encounter: Payer: Self-pay | Admitting: Family Medicine

## 2015-09-21 ENCOUNTER — Ambulatory Visit (INDEPENDENT_AMBULATORY_CARE_PROVIDER_SITE_OTHER): Payer: Medicare Other | Admitting: Pulmonary Disease

## 2015-09-21 ENCOUNTER — Ambulatory Visit (INDEPENDENT_AMBULATORY_CARE_PROVIDER_SITE_OTHER): Payer: Medicare Other | Admitting: Family Medicine

## 2015-09-21 ENCOUNTER — Encounter: Payer: Self-pay | Admitting: Pulmonary Disease

## 2015-09-21 VITALS — BP 120/62 | HR 87 | Temp 98.4°F | Wt 129.0 lb

## 2015-09-21 VITALS — BP 122/60 | HR 77 | Ht 61.0 in | Wt 126.0 lb

## 2015-09-21 DIAGNOSIS — J438 Other emphysema: Secondary | ICD-10-CM

## 2015-09-21 DIAGNOSIS — G4734 Idiopathic sleep related nonobstructive alveolar hypoventilation: Secondary | ICD-10-CM

## 2015-09-21 DIAGNOSIS — K219 Gastro-esophageal reflux disease without esophagitis: Secondary | ICD-10-CM

## 2015-09-21 DIAGNOSIS — J449 Chronic obstructive pulmonary disease, unspecified: Secondary | ICD-10-CM

## 2015-09-21 DIAGNOSIS — G4733 Obstructive sleep apnea (adult) (pediatric): Secondary | ICD-10-CM

## 2015-09-21 DIAGNOSIS — J439 Emphysema, unspecified: Secondary | ICD-10-CM | POA: Diagnosis not present

## 2015-09-21 DIAGNOSIS — R300 Dysuria: Secondary | ICD-10-CM | POA: Diagnosis not present

## 2015-09-21 DIAGNOSIS — J3089 Other allergic rhinitis: Secondary | ICD-10-CM

## 2015-09-21 LAB — POC URINALSYSI DIPSTICK (AUTOMATED)
BILIRUBIN UA: NEGATIVE
Glucose, UA: NEGATIVE
KETONES UA: NEGATIVE
Nitrite, UA: NEGATIVE
PH UA: 7
Protein, UA: NEGATIVE
RBC UA: NEGATIVE
Spec Grav, UA: 1.015
Urobilinogen, UA: 1

## 2015-09-21 LAB — PULMONARY FUNCTION TEST
FEF 25-75 PRE: 1.25 L/s
FEF 25-75 Post: 1.79 L/sec
FEF2575-%Change-Post: 42 %
FEF2575-%Pred-Post: 248 %
FEF2575-%Pred-Pre: 173 %
FEV1-%Change-Post: 9 %
FEV1-%PRED-POST: 112 %
FEV1-%Pred-Pre: 103 %
FEV1-POST: 1.47 L
FEV1-PRE: 1.34 L
FEV1FVC-%Change-Post: 3 %
FEV1FVC-%Pred-Pre: 111 %
FEV6-%CHANGE-POST: 6 %
FEV6-%PRED-POST: 107 %
FEV6-%PRED-PRE: 100 %
FEV6-PRE: 1.67 L
FEV6-Post: 1.78 L
FEV6FVC-%CHANGE-POST: 0 %
FEV6FVC-%PRED-PRE: 108 %
FEV6FVC-%Pred-Post: 108 %
FVC-%CHANGE-POST: 5 %
FVC-%Pred-Post: 98 %
FVC-%Pred-Pre: 93 %
FVC-Post: 1.78 L
FVC-Pre: 1.68 L
POST FEV6/FVC RATIO: 100 %
Post FEV1/FVC ratio: 83 %
Pre FEV1/FVC ratio: 80 %
Pre FEV6/FVC Ratio: 100 %

## 2015-09-21 MED ORDER — CEPHALEXIN 500 MG PO CAPS: 500.0000 mg | ORAL_CAPSULE | Freq: Three times a day (TID) | ORAL | Status: DC

## 2015-09-21 NOTE — Patient Instructions (Signed)
Send urine for culture- should have this back by Monday  Hold off on antibiotic unless worsening symptoms over the weekend  Treat with AZO as needed for symptoms  Could also try vaginal lubricants as urology though atrophic vaginitis may be to blame

## 2015-09-21 NOTE — Progress Notes (Signed)
Spirometry pre and post done today. 

## 2015-09-21 NOTE — Patient Instructions (Signed)
   Try using a saline nasal rinse such as NeilMed twice daily before your Flonase. You can find this in any drug store over-the-counter.  Use your Flonase 1 spray in each nostril twice daily to help with your sinus drainage.  I will see you back in 1 year or sooner if you have any new productive cough or breathing problems.  Remember to get your flu shot in September.

## 2015-09-21 NOTE — Progress Notes (Signed)
Subjective:  [name] [name] is a 82 y.o. year old very pleasant female patient who presents for/with See problem oriented charting ROS- no fever, chills, nausea, vomiting.see any ROS included in HPI as well.   Past Medical History-  Patient Active Problem List   Diagnosis Date Noted  . History of CVA (cerebrovascular accident) 07/29/2013    Priority: High  . Pulmonary nodules 07/29/2013    Priority: High  . COPD with emphysema Gold B 01/04/2010    Priority: High  . Memory loss 04/12/2014    Priority: Medium  . Insomnia 11/21/2013    Priority: Medium  . Recurrent UTI 07/13/2013    Priority: Medium  . History of breast cancer, T1b, N0, Lumpectomy 07/21/2007. 04/21/2011    Priority: Medium  . Essential hypertension 11/12/2006    Priority: Medium  . Allergic rhinitis 04/12/2014    Priority: Low  . Constipation 04/12/2014    Priority: Low  . Raynaud's syndrome 04/25/2009    Priority: Low  . GERD 04/19/2007    Priority: Low  . Chest pain with moderate risk for cardiac etiology 07/25/2014  . Osteoarthritis, knee 04/12/2014    Medications- reviewed and updated Current Outpatient Prescriptions  Medication Sig Dispense Refill  . amLODipine (NORVASC) 2.5 MG tablet TAKE 1 TABLET BY MOUTH EVERY DAY FOR HTN 90 tablet 2  . aspirin 325 MG tablet Take 325 mg by mouth daily.    . fluticasone (FLONASE) 50 MCG/ACT nasal spray Place 2 sprays into both nostrils daily.    . cephALEXin (KEFLEX) 500 MG capsule Take 1 capsule (500 mg total) by mouth 3 (three) times daily. 21 capsule 0  . diazepam (VALIUM) 5 MG tablet TAKE 1 TABLET AT BEDTIME (Patient not taking: Reported on 09/21/2015) 30 tablet 5  . losartan (COZAAR) 50 MG tablet TAKE 1 TABLET BY MOUTH EVERY DAY (Patient not taking: Reported on 09/21/2015) 90 tablet 2  . nitroGLYCERIN (NITROSTAT) 0.4 MG SL tablet Place 1 tablet (0.4 mg total) under the tongue every 5 (five) minutes as needed for chest pain (take as directed). (Patient not taking:  Reported on 09/21/2015) 25 tablet 3   No current facility-administered medications for this visit.    Objective: BP 120/62 mmHg  Pulse 87  Temp(Src) 98.4 F (36.9 C)  Wt 129 lb (58.514 kg) Gen: NAD, resting comfortably CV: RRR no murmurs rubs or gallops Lungs: CTAB no crackles, wheeze, rhonchi Abdomen: soft/nontender except moderate tenderness suprapubically/nondistended/normal bowel sounds. No rebound or guarding.  Ext: no edema Skin: warm, dry, no rash over lower abdomen  Results for orders placed or performed in visit on 09/21/15 (from the past 24 hour(s))  POCT Urinalysis Dipstick (Automated)     Status: Abnormal   Collection Time: 09/21/15  3:51 PM  Result Value Ref Range   Color, UA yellow    Clarity, UA clear    Glucose, UA n    Bilirubin, UA n    Ketones, UA n    Spec Grav, UA 1.015    Blood, UA n    pH, UA 7.0    Protein, UA n    Urobilinogen, UA 1.0    Nitrite, UA n    Leukocytes, UA small (1+) (A) Negative    Assessment/Plan:  Burning with urination - Plan: POCT Urinalysis Dipstick (Automated), Culture, Urine S: Patient with history of recurrent UTI though review of chart shows she truly has recurrent dysuria as she has only had 1 proven UTI since December 2015. Saw urology at that time  and thought likely atrophic vaginitis but patient cannot take estrogen with breast cancer history.   This whole week she has been complaining of dysuria. Slight worsening in last day so daughter brought her in. Also has some suprapubic pain with this described as mild ache. No treatments tried- did get slightly better with more cranberry juice this week but never resolved.   A/P: UA not overly impressive- will send for culture. Given so many times treated for UTI without clear UTI in last 2 years, will try to hold off until can get culture results. Advised AZO prn through weekend but if symptoms worsen despite this then can take keflex (has tolerated well desipte PCN allergy in  past)- given to daughter who will monitor patient  Return precautions advised.   Orders Placed This Encounter  Procedures  . Culture, Urine  . POCT Urinalysis Dipstick (Automated)    Meds ordered this encounter  Medications  . cephALEXin (KEFLEX) 500 MG capsule    Sig: Take 1 capsule (500 mg total) by mouth 3 (three) times daily.    Dispense:  21 capsule    Refill:  0   The duration of face-to-face time during this visit was 20 minutes. Greater than 50% of this time was spent in counseling, explanation of diagnosis, planning of further management, and/or coordination of care.     Garret Reddish, MD

## 2015-09-21 NOTE — Progress Notes (Signed)
PFT 09/21/15: 50 mL 1.68 L (93%) FEV1 1.34 L (103%) FEV1/FVC 0.80 FEF 25-75 1.25 L (173%) no bronchodilator response  6MWT 09/21/15:  Walked 288 meters / Baseline Sat 96% on RA / Nadir Sat 95% on RA  LABS 03/14/15 Alpha-1 antitrypsin: MM (148)

## 2015-09-21 NOTE — Progress Notes (Signed)
Subjective:    Patient ID: [name] [name], female    DOB: Feb 26, 1924, 82 y.o.   MRN: [id number]  C.C.:  Follow-up for COPD w/ Moderate Emphysema, GERD, & OSA/Nocturnal Hypoxia.  HPI COPD w/ Moderate Emphysema:  Mixed type. She denies any exacerbations since last appointment. Denies any coughing or wheezing.   Left Lung Nodule:  Prior left thoracotomy that was complicated. 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 Joya Gaskins.  GERD:  Denies any reflux or dyspepsia. She has been taking off Prilosec since last appointment. No morning brash water taste.   OSA/Nocturnal Hypoxia: Currently prescribed oxygen at 2 L/m while sleeping. Previously was prescribed CPAP but unable to tolerate. Walk test today doesn't show any oxygen requirement on exertion.   Allergic Rhinitis:  She does have frequent sinus congestion & drainage. She reports Flonase seems to help but she doesn't use it regularly.   Review of Systems She reports pain in her right hip. No other new joint pain or swelling. She reports she has had intermittent chest discomfort that has awoke her at night. No chest pain currently. No chest pressure.   Allergies  Allergen Reactions  . Azithromycin Hives, Diarrhea, Dermatitis and Rash  . Ciprofloxacin Swelling  . Levaquin [Levofloxacin] Hives  . Penicillins Hives    has had mild doses that she did not have reactions to  . Prednisone     Unknown   . Sulfamethoxazole Nausea And Vomiting    Current Outpatient Prescriptions on File Prior to Visit  Medication Sig Dispense Refill  . amLODipine (NORVASC) 2.5 MG tablet TAKE 1 TABLET BY MOUTH EVERY DAY FOR HTN 90 tablet 2  . aspirin 325 MG tablet Take 325 mg by mouth daily.    . diazepam (VALIUM) 5 MG tablet TAKE 1 TABLET AT BEDTIME 30 tablet 5  . fluticasone (FLONASE) 50 MCG/ACT nasal spray Place 2 sprays into both nostrils daily.    Marland Kitchen losartan (COZAAR) 50 MG tablet TAKE 1  TABLET BY MOUTH EVERY DAY 90 tablet 2  . nitroGLYCERIN (NITROSTAT) 0.4 MG SL tablet Place 1 tablet (0.4 mg total) under the tongue every 5 (five) minutes as needed for chest pain (take as directed). 25 tablet 3   No current facility-administered medications on file prior to visit.    Past Medical History  Diagnosis Date  . ANXIETY 08/16/2008  . CAD, UNSPECIFIED SITE 10/17/2008  . CHOLELITHIASIS 08/23/2008  . COPD 01/04/2010    FeV1 101%, DLCO64% 2008  . 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 DIVERTICULOSIS-COLON 04/07/2008  . GERD 04/19/2007  . HYPERSOMNIA, ASSOCIATED WITH SLEEP APNEA 02/01/2007  . HYPERTENSION 11/12/2006  . MITRAL VALVE PROLAPSE, HX OF 10/07/2008  . NEOPLASM, MALIGNANT, BREAST, RIGHT 02/15/2008  . OSTEOPOROSIS 04/04/2008  . Raynaud's syndrome 04/25/2009  . Airway obstruction   . History of diverticulitis of colon   . Tortuous colon   . Anal stricture   . Chronic constipation   . Fatty liver   . Hiatal hernia   . Esophageal stricture   . IBS (irritable bowel syndrome)   . Pulmonary fibrosis ([place])   . Sleep apnea   . COPD (chronic obstructive pulmonary disease) ([place]) 01/04/2010  . Colitis     sigmoid  . Candida esophagitis ([place])   . Diverticulitis   . Hearing aid worn     bilateral  . Atrial fibrillation ([place])     she denies known hx of afib 02/24/13  . Complication  of anesthesia     difficult intubation  . Allergic rhinitis   . Emphysema of lung [facility])     Past Surgical History  Procedure Laterality Date  . Abdominal hysterectomy    . Bilateral salpingoophorectomy      s/p prolapsed bladder  . Bladder surgery      prolapsed  . Thoracotomy      granuloma, pulmonary-thoracotomy  . Breast surgery  07-21-07    mastectomy (right), all margins neg and LNs neg   . Appendectomy    . Tonsillectomy    . Rotator cuff repair    . Mastectomy      right  . Breast cyst excision      left  . Mouth surgery    . Eye surgery      cataract removal  bilateral  . Melanoma excision      rt clavicle Dr [facility] office)  . Lung surgery      Family History  Problem Relation Age of Onset  . Parkinsonism Father   . Coronary artery disease Father   . Breast cancer      aunt  . Colon cancer      aunt, age 34  . Uterine cancer      aunt  . Stroke Mother   . Heart failure Mother   . Breast cancer Maternal Aunt   . Colon cancer Maternal Aunt   . Uterine cancer Maternal Aunt   . Lung disease Neg Hx     Social History   Social History  . Marital Status: Widowed    Spouse Name: N/A  . Number of Children: 3  . Years of Education: N/A   Occupational History  . retireed    Social History Main Topics  . Smoking status: Former Smoker -- 2.00 packs/day for 30 years    Types: Cigarettes    Quit date: 05/13/1983  . Smokeless tobacco: Never Used  . Alcohol Use: 0.0 oz/week    0 Standard drinks or equivalent per week     Comment: 1-2 glaases wine per day  . Drug Use: No  . Sexual Activity: No   Other Topics Concern  . None   Social History Narrative   Living in assisted living at [facility]. Widowed 1991. 3 children. 7 grandkids. No greatgrandkids.       Walks down for her meals with a walker.    ADL-bath, clothe, cleaning   IADL-daughter does finances      HCPOA- daughter Janan Ridge   Full code for now- have counseled and advised consider DNR/DNI   States enjoys living and would want resuscitation currently      [facility]:   Patient is originally from [place]. Has traveled to nearly ever one of the [place] states. She has prior travel to [place] & the [place]. Previously enjoyed gardening. 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 Remote bird exposure to a canary as a child. Previously had bird houses in her yard that she cleaned out.       Objective:   Physical Exam BP 122/60 mmHg  Pulse 77  Ht 5\' 1"  (1.549 m)  Wt 126 lb (57.153 kg)  BMI 23.82 kg/m2  SpO2 94% General:  Awake.  Alert. No distress. Accompanied by daughter.  Integument:  Warm & dry. No rash on exposed skin. Marland Kitchen HEENT:  Moist mucus membranes. No oral ulcers. No scleral injection. Continues to have significant bilateral nasal turbinate swelling  with pale mucosa. Cardiovascular:  Regular rate. No edema. Normal S1 & S2.  Pulmonary:  Persistent mild basilar crackles right greater than left. Normal work of breathing on room air. Speaking in complete sentences. Musculoskeletal:  Normal bulk and tone. No joint deformity or effusion appreciated.  PFT 09/21/15: FVC 1.68 L (93%) FEV1 1.34 L (103%) FEV1/FVC 0.80 FEF 25-75 1.25 L (173%) no bronchodilator response 10/11/12: FVC 1.46 L (67%) FEV1 1.27 L (82%) FEV1/FVC 0.87 FEF 25-75 2.09 L (186%)  6MWT 09/21/15: Walked 288 meters / Baseline Sat 96% on RA / Nadir Sat 95% on RA  IMAGING CXR PA/LAT 07/25/14 (previously reviewed by me): 1.8 cm calcified nodule in the left midlung. Mild cardiomegaly. No other parenchymal nodule or opacity appreciated. No pleural effusion. Mediastinum normal in contour.  CTA CHEST & CT ABD/PELVIS W/ 07/24/13 (previously reviewed by me): Calcified nodule in the lingula with associated opacification and some evidence of traction bronchiectasis consistent with prior scar formation. Tiny bilateral pleural effusions with some dependent atelectasis. No other nodule or opacity appreciated. Apical predominant moderate emphysematous changes. No pathologic mediastinal adenopathy. No pulmonary embolus. Splenic calcifications consistent with prior granulomatous exposure present. Extensive diverticulosis noted by the radiologist. Esophagus is patient to the cervical spine.  CARDIAC TTE (07/14/13): LV normal in size with mild LVH. Focal basal hypertrophy of the septum is mild. EF 60-65%. Grade 1 diastolic dysfunction. LA & RA normal in size. RV normal in size and systolic function. No aortic stenosis or regurgitation. Aortic root normal in size. Trivial mitral  regurgitation without stenosis. No pulmonic regurgitation. No tricuspid regurgitation. No pericardial effusion.   LABS 03/14/15 Alpha-1 antitrypsin: MM (148)    Assessment & Plan:  82 year old female with underlying COPD mixed type with emphysema. Patient remains asymptomatic. She has had no exacerbations or new breathing problems. She is having more symptoms from allergic rhinitis which would likely benefit from regular use of her intranasal steroid therapy. She remains asymptomatic on reflux medication at this time. I instructed the patient to contact my office if she had any new breathing problems or questions before her next appointment.   1. COPD Overlap W/ Emphysema: Spirometry has improved since previous testing. Patient continues to have no evidence of bronchodilator response. Given absence of symptoms and continued stability without evidence of airway obstruction I believe the patient has more of an asthmatic phenotype. Continuing to hold on inhaler medications at this time. 2. GERD:  Currently asymptomatic on medication. No new medications at this time. 3. Allergic rhinitis: Patient counseled to try using nasal saline rinse twice daily preceding Flonase twice daily.  4. OSA: Continuing on nocturnal oxygen therapy at 2 L/m. Previously intolerant of CPAP. 5. Health maintenance: Influenza vaccine September 2016, Prevnar October 2015, Bridgette Habermann October 2010, & Tdap August 2012. 6. Follow-up: Patient to return to clinic in one yearhs or sooner if needed.   Sonia Baller Ashok Cordia, M.D. [facility] Pager:  [phone] After 3pm or if no response, call [phone] 2:11 PM 09/21/2015

## 2015-09-24 LAB — URINE CULTURE: Colony Count: >=100000

## 2015-09-25 ENCOUNTER — Telehealth: Payer: Self-pay | Admitting: Family Medicine

## 2015-09-25 NOTE — Telephone Encounter (Signed)
Pt is return [name] [name] call

## 2015-10-01 ENCOUNTER — Ambulatory Visit: Payer: Medicare Other | Admitting: Neurology

## 2015-10-22 ENCOUNTER — Ambulatory Visit: Payer: Medicare Other | Admitting: Neurology

## 2015-11-12 ENCOUNTER — Ambulatory Visit (INDEPENDENT_AMBULATORY_CARE_PROVIDER_SITE_OTHER): Payer: Medicare Other | Admitting: Family Medicine

## 2015-11-12 ENCOUNTER — Encounter: Payer: Self-pay | Admitting: Family Medicine

## 2015-11-12 VITALS — BP 132/76 | HR 78 | Temp 99.1°F | Ht 61.0 in | Wt 122.0 lb

## 2015-11-12 DIAGNOSIS — R058 Other specified cough: Secondary | ICD-10-CM

## 2015-11-12 DIAGNOSIS — R509 Fever, unspecified: Secondary | ICD-10-CM | POA: Diagnosis not present

## 2015-11-12 DIAGNOSIS — J441 Chronic obstructive pulmonary disease with (acute) exacerbation: Secondary | ICD-10-CM

## 2015-11-12 DIAGNOSIS — R05 Cough: Secondary | ICD-10-CM | POA: Diagnosis not present

## 2015-11-12 DIAGNOSIS — J029 Acute pharyngitis, unspecified: Secondary | ICD-10-CM | POA: Diagnosis not present

## 2015-11-12 MED ORDER — PREDNISONE 20 MG PO TABS: ORAL_TABLET | ORAL | Status: DC

## 2015-11-12 MED ORDER — DOXYCYCLINE HYCLATE 100 MG PO TABS: 100.0000 mg | ORAL_TABLET | Freq: Two times a day (BID) | ORAL | Status: DC

## 2015-11-12 NOTE — Progress Notes (Signed)
Pre visit review using our clinic review tool, if applicable. No additional management support is needed unless otherwise documented below in the visit note. 

## 2015-11-12 NOTE — Progress Notes (Signed)
Subjective:  Raaga Sudol is a 82 y.o. year old very pleasant female patient who presents for/with See problem oriented charting ROS- no nausea, vomiting, diarrhea, constipation, ear pain.see any ROS included in HPI as well.   Past Medical History-  Patient Active Problem List   Diagnosis Date Noted  . History of CVA (cerebrovascular accident) 07/29/2013    Priority: High  . Pulmonary nodules 07/29/2013    Priority: High  . COPD with emphysema Gold B 01/04/2010    Priority: High  . Memory loss 04/12/2014    Priority: Medium  . Insomnia 11/21/2013    Priority: Medium  . Recurrent UTI 07/13/2013    Priority: Medium  . History of breast cancer, T1b, N0, Lumpectomy 07/21/2007. 04/21/2011    Priority: Medium  . Essential hypertension 11/12/2006    Priority: Medium  . Allergic rhinitis 04/12/2014    Priority: Low  . Constipation 04/12/2014    Priority: Low  . Raynaud's syndrome 04/25/2009    Priority: Low  . GERD 04/19/2007    Priority: Low  . Chest pain with moderate risk for cardiac etiology 07/25/2014  . Osteoarthritis, knee 04/12/2014    Medications- reviewed and updated Current Outpatient Prescriptions  Medication Sig Dispense Refill  . amLODipine (NORVASC) 2.5 MG tablet TAKE 1 TABLET BY MOUTH EVERY DAY FOR HTN 90 tablet 2  . aspirin 325 MG tablet Take 325 mg by mouth daily.    . cephALEXin (KEFLEX) 500 MG capsule Take 1 capsule (500 mg total) by mouth 3 (three) times daily. 21 capsule 0  . diazepam (VALIUM) 5 MG tablet TAKE 1 TABLET AT BEDTIME 30 tablet 5  . fluticasone (FLONASE) 50 MCG/ACT nasal spray Place 2 sprays into both nostrils daily.    Marland Kitchen losartan (COZAAR) 50 MG tablet TAKE 1 TABLET BY MOUTH EVERY DAY 90 tablet 2  . nitroGLYCERIN (NITROSTAT) 0.4 MG SL tablet Place 1 tablet (0.4 mg total) under the tongue every 5 (five) minutes as needed for chest pain (take as directed). 25 tablet 3   No current facility-administered medications for this visit.    Objective: BP  132/76 mmHg  Pulse 78  Temp(Src) 99.1 F (37.3 C) (Oral)  Ht 5\' 1"  (1.549 m)  Wt 122 lb (55.339 kg)  BMI 23.06 kg/m2  SpO2 94% Gen: NAD, resting comfortably Tympanic membrane normal. Oropharynx with pharynx with mild erythema but otherwise normal. Nares normal. CV: RRR no murmurs rubs or gallops Lungs: CTAB no crackles, wheeze, rhonchi. Coughs up yellow sputum several times during visit Abdomen: soft/nontender/nondistended/normal bowel sounds.  Ext: no edema Skin: warm, dry Neuro: Hard of hearing, some memory loss per baseline  Assessment/Plan:  Fever, productive cough, congestion, sore throat S: Fever up to 102 last night, does not think she had a fever until last night. Symptoms starting Saturday, took some tylenol very early today. States has some tightness breathing with history of COPD. mild wheeze. Thinks fever broke last night. Throat sore. Not sure. Took keflex a few yesterday that she had over from prior UTI-needs to throw away all old pills. A/P:Allergy azithromycin, cipro, levaquin, pcn which really limits options. Known COPD. too late in day to get chest x-ray. Pneumonia is possible but treatment options limited given allergies. Patient has 89th birthday party later this week with people coming in from across the country. We will do the best we can to cover her. We will treat with prednisone and doxycycline in case this is a COPD exacerbation-she does have increased sputum production with yellow  sputum which is abnormal for her. Would also cover reasonably well for sinusitis and some coverage for pneumonia as well. Sinusitis is low on my differential. We discussed if this does not work options are limited. Could return for repeat evaluation but honestly may need hospitalization with her comorbidities and allergies to oral medications. Exam not consistent with strep throat  Meds ordered this encounter  Medications  . predniSONE (DELTASONE) 20 MG tablet    Sig: Take 2 pills for 2  days, 1 pill for 5 days    Dispense:  9 tablet    Refill:  0  . doxycycline (VIBRA-TABS) 100 MG tablet    Sig: Take 1 tablet (100 mg total) by mouth 2 (two) times daily.    Dispense:  14 tablet    Refill:  0    Return precautions advised.  Garret Reddish, MD

## 2015-11-12 NOTE — Patient Instructions (Signed)
Covering for COPD flare up with prednisone and doxycycline  If symptoms worsen or fever not improving within 48 hours, needs to return to care. May be situation where she needs to be hospitalized as not many antibiotic options

## 2015-11-18 ENCOUNTER — Other Ambulatory Visit: Payer: Self-pay | Admitting: Family Medicine

## 2015-11-19 ENCOUNTER — Encounter: Payer: Self-pay | Admitting: Family Medicine

## 2015-11-19 ENCOUNTER — Ambulatory Visit (INDEPENDENT_AMBULATORY_CARE_PROVIDER_SITE_OTHER): Payer: Medicare Other | Admitting: Family Medicine

## 2015-11-19 VITALS — BP 120/70 | HR 81 | Temp 97.9°F | Ht 61.0 in | Wt 124.0 lb

## 2015-11-19 DIAGNOSIS — R059 Cough, unspecified: Secondary | ICD-10-CM

## 2015-11-19 DIAGNOSIS — R05 Cough: Secondary | ICD-10-CM

## 2015-11-19 DIAGNOSIS — J441 Chronic obstructive pulmonary disease with (acute) exacerbation: Secondary | ICD-10-CM | POA: Diagnosis not present

## 2015-11-19 MED ORDER — BENZONATATE 100 MG PO CAPS: 100.0000 mg | ORAL_CAPSULE | Freq: Three times a day (TID) | ORAL | Status: DC

## 2015-11-19 MED ORDER — FLUTICASONE PROPIONATE 50 MCG/ACT NA SUSP: 2.0000 | Freq: Every day | NASAL | Status: AC

## 2015-11-19 NOTE — Patient Instructions (Signed)
Please continue rest, increase fluids, and use benzonatate for cough as needed. You may use tylenol for resolving soreness from coughing. If symptoms return or you do not feel that you are improving or you develop a fever, please return to clinic and follow up with Dr. Yong Channel

## 2015-11-19 NOTE — Progress Notes (Signed)
Subjective:    Patient ID: [name] [name], female    DOB: 10-01-1923, 82 y.o.   MRN: [id number]  HPI  [name] [name] is a 82 year old female who presents today for follow up of a COPD exacerbation, fever, sore throat, and productive cough. She was evaluated on 11/12/2015 for symptoms and noted some tightness breathing with a history of COPD and mild wheezing.  She has an extensive list of allergies including azithromycin, cipro, levaquin, pcn which limited options for treatment.  She was treated with prednisone and doxycycline for a possible COPD exacerbation as she was noted to have increased sputum production with yellow sputum which is abnormal for her. Her PCP indicated that she may require hospitalization with her comorbidities and allergies to oral medications if symptoms did not improve and she required a reevaluation. She currently uses oxygen 2 L at night. She presents today with her daughter so her "lungs can be checked". She also reports feeling much better and denies fever, sore throat, or wheezing.  Associated symptom of productive cough is still present but she reports less frequent coughing episodes.  Her cough is productive of white to light yellow sputum. She completed her prednisone and doxycycline therapy without any adverse effects noted.     Review of Systems  Constitutional: Negative for fever, chills and fatigue.       HOH  HENT: Positive for rhinorrhea. Negative for sinus pressure and sore throat.   Eyes:       Watery eyes  Respiratory: Positive for cough.   Cardiovascular: Negative for chest pain and palpitations.  Gastrointestinal: Negative for nausea, vomiting, abdominal pain and diarrhea.  Musculoskeletal: Negative for myalgias.  Neurological: Negative for dizziness, light-headedness and headaches.   Past Medical History  Diagnosis Date  . ANXIETY 08/16/2008  . CAD, UNSPECIFIED SITE 10/17/2008  . CHOLELITHIASIS 08/23/2008  . COPD 01/04/2010    FeV1 101%, DLCO64% 2008  .  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 DIVERTICULOSIS-COLON 04/07/2008  . GERD 04/19/2007  . HYPERSOMNIA, ASSOCIATED WITH SLEEP APNEA 02/01/2007  . HYPERTENSION 11/12/2006  . MITRAL VALVE PROLAPSE, HX OF 10/07/2008  . NEOPLASM, MALIGNANT, BREAST, RIGHT 02/15/2008  . OSTEOPOROSIS 04/04/2008  . Raynaud's syndrome 04/25/2009  . Airway obstruction   . History of diverticulitis of colon   . Tortuous colon   . Anal stricture   . Chronic constipation   . Fatty liver   . Hiatal hernia   . Esophageal stricture   . IBS (irritable bowel syndrome)   . Pulmonary fibrosis ([place])   . Sleep apnea   . COPD (chronic obstructive pulmonary disease) ([place]) 01/04/2010  . Colitis     sigmoid  . Candida esophagitis ([place])   . Diverticulitis   . Hearing aid worn     bilateral  . Atrial fibrillation ([place])     she denies known hx of afib 02/24/13  . Complication of anesthesia     difficult intubation  . Allergic rhinitis   . Emphysema of lung [facility])      Social History   Social History  . Marital Status: Widowed    Spouse Name: N/A  . Number of Children: 3  . Years of Education: N/A   Occupational History  . retireed    Social History Main Topics  . Smoking status: Former Smoker -- 2.00 packs/day for 30 years    Types: Cigarettes    Quit date: 05/13/1983  . Smokeless tobacco: Never Used  . Alcohol Use: 0.0 oz/week  0 Standard drinks or equivalent per week     Comment: 1-2 glaases wine per day  . Drug Use: No  . Sexual Activity: No   Other Topics Concern  . Not on file   Social History Narrative   Living in assisted living at [facility]. Widowed 1991. 3 children. 7 grandkids. No greatgrandkids.       Walks down for her meals with a walker.    ADL-bath, clothe, cleaning   IADL-daughter does finances      HCPOA- daughter [name] [name]   Full code for now- have counseled and advised consider DNR/DNI   States enjoys living and would want resuscitation currently       [place] [facility]:   Patient is originally from [place]. Has traveled to nearly ever one of the [place] states. She has prior travel to [place] & the [place]. Previously enjoyed gardening. 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 Remote bird exposure to a canary as a child. Previously had bird houses in her yard that she cleaned out.     Past Surgical History  Procedure Laterality Date  . Abdominal hysterectomy    . Bilateral salpingoophorectomy      s/p prolapsed bladder  . Bladder surgery      prolapsed  . Thoracotomy      granuloma, pulmonary-thoracotomy  . Breast surgery  07-21-07    mastectomy (right), all margins neg and LNs neg   . Appendectomy    . Tonsillectomy    . Rotator cuff repair    . Mastectomy      right  . Breast cyst excision      left  . Mouth surgery    . Eye surgery      cataract removal bilateral  . Melanoma excision      rt clavicle Dr Rhoderick [facility] office)  . Lung surgery      Family History  Problem Relation Age of Onset  . Parkinsonism Father   . Coronary artery disease Father   . Breast cancer      aunt  . Colon cancer      aunt, age 41  . Uterine cancer      aunt  . Stroke Mother   . Heart failure Mother   . Breast cancer Maternal Aunt   . Colon cancer Maternal Aunt   . Uterine cancer Maternal Aunt   . Lung disease Neg Hx     Allergies  Allergen Reactions  . Azithromycin Hives, Diarrhea, Dermatitis and Rash  . Ciprofloxacin Swelling  . Levaquin [Levofloxacin] Hives  . Penicillins Hives    has had mild doses that she did not have reactions to  . Sulfamethoxazole Nausea And Vomiting    Current Outpatient Prescriptions on File Prior to Visit  Medication Sig Dispense Refill  . amLODipine (NORVASC) 2.5 MG tablet TAKE 1 TABLET BY MOUTH EVERY DAY FOR HTN 90 tablet 2  . aspirin 325 MG tablet Take 325 mg by mouth daily.    . diazepam (VALIUM) 5 MG tablet TAKE 1 TABLET AT BEDTIME 30 tablet 5  . losartan (COZAAR) 50 MG  tablet TAKE 1 TABLET BY MOUTH EVERY DAY 90 tablet 2  . nitroGLYCERIN (NITROSTAT) 0.4 MG SL tablet Place 1 tablet (0.4 mg total) under the tongue every 5 (five) minutes as needed for chest pain (take as directed). 25 tablet 3   No current facility-administered medications on file prior to visit.    BP 120/70  mmHg  Pulse 81  Temp(Src) 97.9 F (36.6 C) (Oral)  Ht 5\' 1"  (1.549 m)  Wt 124 lb (56.246 kg)  BMI 23.44 kg/m2  SpO2 94%       Objective:   Physical Exam  Constitutional: She is oriented to person, place, and time. She appears well-developed and well-nourished.  HENT:  Nose: No rhinorrhea. Right sinus exhibits no maxillary sinus tenderness and no frontal sinus tenderness. Left sinus exhibits no maxillary sinus tenderness and no frontal sinus tenderness.  Mouth/Throat: Mucous membranes are normal. No oropharyngeal exudate or posterior oropharyngeal erythema.  Eyes: Pupils are equal, round, and reactive to light.  Neck: Neck supple.  Cardiovascular: Normal rate and regular rhythm.   Pulmonary/Chest: Effort normal and breath sounds normal. She has no wheezes. She has no rales.  Abdominal: Soft. Bowel sounds are normal.  Lymphadenopathy:    She has no cervical adenopathy.  Neurological: She is alert and oriented to person, place, and time.  Skin: Skin is warm and dry. No rash noted.      Assessment & Plan:  1. COPD exacerbation (HCC) Completion of prednisone and doxycycline has improved symptoms and exam is unremarkable. Advised patient and daughter to continue supportive measures and follow up with Dr. Yong Channel if she develops new symptoms or she does not continue to improve. Discussed the importance of follow up if she notes fever, SOB, or increased coughing with her history and limitation of oral antibiotics due to allergy profile. With improving symptoms, CXR will not be obtained today. Advised patient to follow up with Dr. Yong Channel for chronic management as directed or sooner if  needed.  2. Cough  - benzonatate (TESSALON) 100 MG capsule; Take 1 capsule (100 mg total) by mouth 3 (three) times daily.  Dispense: 20 capsule; Refill: 0  Delano Metz, FNP-C

## 2015-11-23 ENCOUNTER — Encounter: Payer: Self-pay | Admitting: Adult Health

## 2015-11-23 ENCOUNTER — Ambulatory Visit (INDEPENDENT_AMBULATORY_CARE_PROVIDER_SITE_OTHER)
Admission: RE | Admit: 2015-11-23 | Discharge: 2015-11-23 | Disposition: A | Discharge status: Home / Self Care | Payer: Medicare Other | Source: Ambulatory Visit | Attending: Adult Health | Admitting: Adult Health

## 2015-11-23 ENCOUNTER — Ambulatory Visit (INDEPENDENT_AMBULATORY_CARE_PROVIDER_SITE_OTHER): Payer: Medicare Other | Admitting: Adult Health

## 2015-11-23 VITALS — BP 104/60 | HR 85 | Temp 97.5°F | Wt 124.0 lb

## 2015-11-23 DIAGNOSIS — R05 Cough: Secondary | ICD-10-CM | POA: Diagnosis not present

## 2015-11-23 DIAGNOSIS — R0602 Shortness of breath: Secondary | ICD-10-CM | POA: Diagnosis not present

## 2015-11-23 DIAGNOSIS — R059 Cough, unspecified: Secondary | ICD-10-CM

## 2015-11-23 MED ORDER — ALBUTEROL SULFATE HFA 108 (90 BASE) MCG/ACT IN AERS: 2.0000 | INHALATION_SPRAY | Freq: Four times a day (QID) | RESPIRATORY_TRACT | Status: AC | PRN

## 2015-11-23 MED ORDER — PREDNISONE 10 MG PO TABS: ORAL_TABLET | ORAL | Status: DC

## 2015-11-23 NOTE — Progress Notes (Signed)
Subjective:    Patient ID: [name] [name], female    DOB: January 31, 1924, 82 y.o.   MRN: [id number]  HPI  82 year old female who presents to the office today for continued cough   She saw her PCP, Dr. Yong Channel on 11/12/2015 and was diagnosed with a COPD exacerbation. She was prescribed prednisone taper and doxycycline.   She then saw Almira Coaster, NP on 11/19/2015 at which time she was feeling improved after completing her prednisone and doxycycline prescriptions.  Today she reports that her symptoms have returned " I am tired of coughing". She reports that her chest muscle are starting to ache from so much coughing. She also reports PND and rhinorrhea. Denies any new fevers   She has not been using her daily inhaler.   Review of Systems  Constitutional: Positive for fatigue.  HENT: Positive for congestion, postnasal drip and rhinorrhea.   Respiratory: Positive for cough, chest tightness and shortness of breath. Negative for wheezing.   Cardiovascular: Negative.   Musculoskeletal: Positive for myalgias.  Skin: Negative.   All other systems reviewed and are negative.  Past Medical History  Diagnosis Date  . ANXIETY 08/16/2008  . CAD, UNSPECIFIED SITE 10/17/2008  . CHOLELITHIASIS 08/23/2008  . COPD 01/04/2010    FeV1 101%, DLCO64% 2008  . 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 DIVERTICULOSIS-COLON 04/07/2008  . GERD 04/19/2007  . HYPERSOMNIA, ASSOCIATED WITH SLEEP APNEA 02/01/2007  . HYPERTENSION 11/12/2006  . MITRAL VALVE PROLAPSE, HX OF 10/07/2008  . NEOPLASM, MALIGNANT, BREAST, RIGHT 02/15/2008  . OSTEOPOROSIS 04/04/2008  . Raynaud's syndrome 04/25/2009  . Airway obstruction   . History of diverticulitis of colon   . Tortuous colon   . Anal stricture   . Chronic constipation   . Fatty liver   . Hiatal hernia   . Esophageal stricture   . IBS (irritable bowel syndrome)   . Pulmonary fibrosis ([place])   . Sleep apnea   . COPD (chronic obstructive pulmonary disease) ([place])  01/04/2010  . Colitis     sigmoid  . Candida esophagitis ([place])   . Diverticulitis   . Hearing aid worn     bilateral  . Atrial fibrillation ([place])     she denies known hx of afib 02/24/13  . Complication of anesthesia     difficult intubation  . Allergic rhinitis   . Emphysema of lung [facility])     Social History   Social History  . Marital Status: Widowed    Spouse Name: N/A  . Number of Children: 3  . Years of Education: N/A   Occupational History  . retireed    Social History Main Topics  . Smoking status: Former Smoker -- 2.00 packs/day for 30 years    Types: Cigarettes    Quit date: 05/13/1983  . Smokeless tobacco: Never Used  . Alcohol Use: 0.0 oz/week    0 Standard drinks or equivalent per week     Comment: 1-2 glaases wine per day  . Drug Use: No  . Sexual Activity: No   Other Topics Concern  . Not on file   Social History Narrative   Living in assisted living at [facility]. Widowed 1991. 3 children. 7 grandkids. No greatgrandkids.       Walks down for her meals with a walker.    ADL-bath, clothe, cleaning   IADL-daughter does finances      HCPOA- daughter Janan Ridge   Full code for now- have counseled and advised consider DNR/DNI  States enjoys living and would want resuscitation currently      [place] [facility]:   Patient is originally from [place]. Has traveled to nearly ever one of the [place] states. She has prior travel to [place] & the [place]. Previously enjoyed gardening. 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 Remote bird exposure to a canary as a child. Previously had bird houses in her yard that she cleaned out.     Past Surgical History  Procedure Laterality Date  . Abdominal hysterectomy    . Bilateral salpingoophorectomy      s/p prolapsed bladder  . Bladder surgery      prolapsed  . Thoracotomy      granuloma, pulmonary-thoracotomy  . Breast surgery  07-21-07    mastectomy (right), all margins neg and  LNs neg   . Appendectomy    . Tonsillectomy    . Rotator cuff repair    . Mastectomy      right  . Breast cyst excision      left  . Mouth surgery    . Eye surgery      cataract removal bilateral  . Melanoma excision      rt clavicle Dr [facility] office)  . Lung surgery      Family History  Problem Relation Age of Onset  . Parkinsonism Father   . Coronary artery disease Father   . Breast cancer      aunt  . Colon cancer      aunt, age 40  . Uterine cancer      aunt  . Stroke Mother   . Heart failure Mother   . Breast cancer Maternal Aunt   . Colon cancer Maternal Aunt   . Uterine cancer Maternal Aunt   . Lung disease Neg Hx     Allergies  Allergen Reactions  . Azithromycin Hives, Diarrhea, Dermatitis and Rash  . Ciprofloxacin Swelling  . Levaquin [Levofloxacin] Hives  . Penicillins Hives    has had mild doses that she did not have reactions to  . Sulfamethoxazole Nausea And Vomiting    Current Outpatient Prescriptions on File Prior to Visit  Medication Sig Dispense Refill  . amLODipine (NORVASC) 2.5 MG tablet TAKE 1 TABLET BY MOUTH EVERY DAY FOR HTN 90 tablet 2  . aspirin 325 MG tablet Take 325 mg by mouth daily.    . benzonatate (TESSALON) 100 MG capsule Take 1 capsule (100 mg total) by mouth 3 (three) times daily. 20 capsule 0  . diazepam (VALIUM) 5 MG tablet TAKE 1 TABLET AT BEDTIME 30 tablet 5  . fluticasone (FLONASE) 50 MCG/ACT nasal spray Place 2 sprays into both nostrils daily.    Marland Kitchen losartan (COZAAR) 50 MG tablet TAKE 1 TABLET BY MOUTH EVERY DAY 90 tablet 2  . nitroGLYCERIN (NITROSTAT) 0.4 MG SL tablet Place 1 tablet (0.4 mg total) under the tongue every 5 (five) minutes as needed for chest pain (take as directed). 25 tablet 3   No current facility-administered medications on file prior to visit.    BP 104/60 mmHg  Pulse 85  Temp(Src) 97.5 F (36.4 C) (Oral)  Wt 124 lb (56.246 kg)  SpO2 95%       Objective:   Physical Exam  Constitutional:  She is oriented to person, place, and time. She appears well-developed and well-nourished. No distress.  HENT:  Mouth/Throat: Mucous membranes are normal. Oropharyngeal exudate present. No posterior oropharyngeal edema, posterior oropharyngeal erythema or tonsillar  abscesses.  Cardiovascular: Normal rate, regular rhythm, normal heart sounds and intact distal pulses.  Exam reveals no gallop and no friction rub.   No murmur heard. Pulmonary/Chest: Effort normal and breath sounds normal. No respiratory distress. She has no wheezes. She has no rales. She exhibits no tenderness.  Neurological: She is alert and oriented to person, place, and time.  Skin: Skin is warm and dry. No rash noted. She is not diaphoretic. No erythema. No pallor.  Psychiatric: She has a normal mood and affect. Her behavior is normal. Judgment and thought content normal.  Nursing note and vitals reviewed.     Assessment & Plan:  1. Cough - Possibly bronchitis but want to rule out pneumonia.  - albuterol (PROVENTIL HFA;VENTOLIN HFA) 108 (90 Base) MCG/ACT inhaler; Inhale 2 puffs into the lungs every 6 (six) hours as needed for wheezing or shortness of breath.  Dispense: 1 Inhaler; Refill: 0 - DG Chest 2 View; Future - Showed Interval mild bronchitic changes. Stable cardiomegaly. Will prescribe longer taper of prednisone. Spoke with Daughter [name]) after x ray was back and informed her of the results. She is ok with doing a longer prednisone taper.  - predniSONE (DELTASONE) 10 MG tablet; 40 mg x 3 days, 20 mg x 3 days, 10 mg x 3 days  Dispense: 21 tablet; Refill: 0 - Can take Mucinex to help with cough and sinus drainage.  - Follow up with Dr. Yong Channel if needed  Dorothyann Peng, NP

## 2015-11-23 NOTE — Patient Instructions (Signed)
It was great meeting you .   I will have you get a chest x ray and then we can decide on what to do next.

## 2015-12-07 ENCOUNTER — Ambulatory Visit (INDEPENDENT_AMBULATORY_CARE_PROVIDER_SITE_OTHER): Payer: Medicare Other | Admitting: Neurology

## 2015-12-07 ENCOUNTER — Encounter: Payer: Self-pay | Admitting: Neurology

## 2015-12-07 VITALS — BP 130/80 | HR 82 | Ht 62.0 in | Wt 122.0 lb

## 2015-12-07 DIAGNOSIS — F0390 Unspecified dementia without behavioral disturbance: Secondary | ICD-10-CM

## 2015-12-07 DIAGNOSIS — F03A0 Unspecified dementia, mild, without behavioral disturbance, psychotic disturbance, mood disturbance, and anxiety: Secondary | ICD-10-CM

## 2015-12-07 MED ORDER — DONEPEZIL HCL 10 MG PO TABS: ORAL_TABLET | ORAL | 11 refills | Status: DC

## 2015-12-07 NOTE — Progress Notes (Signed)
NEUROLOGY CONSULTATION NOTE  [name] [name] MRN: [id number] DOB: [birth year]  Referring provider: Dr. Garret Reddish Primary care provider: Dr. Garret Reddish  Reason for consult:  Memory loss  Dear Dr Yong Channel:  Thank you for your kind referral of [name] [name] for consultation of the above symptoms. Although her history is well known to you, please allow me to reiterate it for the purpose of our medical record. The patient was accompanied to the clinic by daughter who also provides collateral information. Records and images were personally reviewed where available.  HISTORY OF PRESENT ILLNESS: This is a very pleasant 82 year old right-handed woman with a history of atrial fibrillation, anxiety, hearing loss, presenting for evaluation of worsening memory. She feels her memory is good, especially long-term memory. Sometimes she forgets something. Her daughter started noticing more pronounced memory changes over the past couple of years. She has been fixing her mother's medications for the past 1-1/2 years because she was forgetting medications. She lives by herself in an independent living facility, and her daughter sometimes sees the pills in the pillbox. Her daughter has been in charge of bill payments because she was missing payments. She is not driving anymore, her daughter reports she was getting a little confused while driving. Her apartment is in [place], she gets upset when her daughter tries to clean up. She has also become more suspicious in the past year, she has accused her housekeeper of stealing things and her daughter of taking things from her. She repeats the same conversation 3-4 times. Her daughter denies any hallucinations. She denies any family history of dementia. She hit her head on the car 2 years ago and has occasional left frontal headaches where she hit it. She drinks a glass of wine everyday, sometimes 2 when there is company. She denies any dizziness, diplopia,  dysarthria/dysphagia, bowel/bladder incontinence, tremors, anosmia. She has occasional neck and back pain, her knees have been bothering her, she has told her daughter they feel weak and not connected to her body. No focal numbness/tingling. Last fall was 2 years ago. She sleeps well at night and needs nocturnal oxygen.   She had an MMSE at her PCP office in January 2016 which was 64, then most recently 24/30 in April 2017. TSH and B12 unremarkable. I personally reviewed head CT without contrast done 10/06/15 which did not show any acute changes. There was moderately severe cortical volume loss and chronic microvascular disease, cerebellar atrophy.  Lab Results  Component Value Date   TSH 1.73 08/20/2015   Lab Results  Component Value Date   VITAMINB12 260 08/20/2015    PAST MEDICAL HISTORY: Past Medical History:  Diagnosis Date  . Airway obstruction   . Allergic rhinitis   . Anal stricture   . ANXIETY 08/16/2008  . Atrial fibrillation ([place])    she denies known hx of afib 02/24/13  . CAD, UNSPECIFIED SITE 10/17/2008  . Candida esophagitis ([place])   . CHOLELITHIASIS 08/23/2008  . Chronic constipation   . Colitis    sigmoid  . Complication of anesthesia    difficult intubation  . COPD 01/04/2010   FeV1 101%, DLCO64% 2008  . COPD (chronic obstructive pulmonary disease) ([place]) 01/04/2010  . 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 Diverticulitis   . DIVERTICULOSIS-COLON 04/07/2008  . Emphysema of lung ([place])   . Esophageal stricture   . Fatty liver   . GERD 04/19/2007  . Hearing aid worn    bilateral  . Hiatal hernia   .  History of diverticulitis of colon   . HYPERSOMNIA, ASSOCIATED WITH SLEEP APNEA 02/01/2007  . HYPERTENSION 11/12/2006  . IBS (irritable bowel syndrome)   . MITRAL VALVE PROLAPSE, HX OF 10/07/2008  . NEOPLASM, MALIGNANT, BREAST, RIGHT 02/15/2008  . OSTEOPOROSIS 04/04/2008  . Pulmonary fibrosis ([place])   . Raynaud's syndrome 04/25/2009  . Sleep apnea   . Tortuous  colon     PAST SURGICAL HISTORY: Past Surgical History:  Procedure Laterality Date  . ABDOMINAL HYSTERECTOMY    . APPENDECTOMY    . BILATERAL SALPINGOOPHORECTOMY     s/p prolapsed bladder  . BLADDER SURGERY     prolapsed  . BREAST CYST EXCISION     left  . BREAST SURGERY  07-21-07   mastectomy (right), all margins neg and LNs neg   . EYE SURGERY     cataract removal bilateral  . LUNG SURGERY    . MASTECTOMY     right  . MELANOMA EXCISION     rt clavicle Dr Rhoderick [facility] office)  . MOUTH SURGERY    . ROTATOR CUFF REPAIR    . THORACOTOMY     granuloma, pulmonary-thoracotomy  . TONSILLECTOMY      MEDICATIONS: Current Outpatient Prescriptions on File Prior to Visit  Medication Sig Dispense Refill  . albuterol (PROVENTIL HFA;VENTOLIN HFA) 108 (90 Base) MCG/ACT inhaler Inhale 2 puffs into the lungs every 6 (six) hours as needed for wheezing or shortness of breath. 1 Inhaler 0  . amLODipine (NORVASC) 2.5 MG tablet TAKE 1 TABLET BY MOUTH EVERY DAY FOR HTN 90 tablet 2  . aspirin 325 MG tablet Take 325 mg by mouth daily.    . benzonatate (TESSALON) 100 MG capsule Take 1 capsule (100 mg total) by mouth 3 (three) times daily. 20 capsule 0  . diazepam (VALIUM) 5 MG tablet TAKE 1 TABLET AT BEDTIME 30 tablet 5  . fluticasone (FLONASE) 50 MCG/ACT nasal spray Place 2 sprays into both nostrils daily.    Marland Kitchen losartan (COZAAR) 50 MG tablet TAKE 1 TABLET BY MOUTH EVERY DAY 90 tablet 2  . nitroGLYCERIN (NITROSTAT) 0.4 MG SL tablet Place 1 tablet (0.4 mg total) under the tongue every 5 (five) minutes as needed for chest pain (take as directed). 25 tablet 3  . predniSONE (DELTASONE) 10 MG tablet 40 mg x 3 days, 20 mg x 3 days, 10 mg x 3 days 21 tablet 0   No current facility-administered medications on file prior to visit.     ALLERGIES: Allergies  Allergen Reactions  . Azithromycin Hives, Diarrhea, Dermatitis and Rash  . Ciprofloxacin Swelling  . Levaquin [Levofloxacin] Hives  . Penicillins  Hives    has had mild doses that she did not have reactions to  . Sulfamethoxazole Nausea And Vomiting    FAMILY HISTORY: Family History  Problem Relation Age of Onset  . Parkinsonism Father   . Coronary artery disease Father   . Breast cancer      aunt  . Colon cancer      aunt, age 54  . Uterine cancer      aunt  . Stroke Mother   . Heart failure Mother   . Breast cancer Maternal Aunt   . Colon cancer Maternal Aunt   . Uterine cancer Maternal Aunt   . Lung disease Neg Hx     SOCIAL HISTORY: Social History   Social History  . Marital status: Widowed    Spouse name: N/A  . Number of children: 3  .  Years of education: N/A   Occupational History  . retireed Retired   Social History Main Topics  . Smoking status: Former Smoker    Packs/day: 2.00    Years: 30.00    Types: Cigarettes    Quit date: 05/13/1983  . Smokeless tobacco: Never Used  . Alcohol use 0.0 oz/week     Comment: 1-2 glaases wine per day  . Drug use: No  . Sexual activity: No   Other Topics Concern  . Not on file   Social History Narrative   Living in assisted living at [facility]. Widowed 1991. 3 children. 7 grandkids. No greatgrandkids.       Walks down for her meals with a walker.    ADL-bath, clothe, cleaning   IADL-daughter does finances      HCPOA- daughter Janan [place]   Full code for now- have counseled and advised consider DNR/DNI   States enjoys living and would want resuscitation currently      [facility]:   Patient is originally from [place]. Has traveled to nearly ever one of the [place] states. She has prior travel to [place] & the [place]. Previously enjoyed gardening. 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 Remote bird exposure to a canary as a child. Previously had bird houses in her yard that she cleaned out.     REVIEW OF SYSTEMS: Constitutional: No fevers, chills, or sweats, no generalized fatigue, change in appetite Eyes: No visual  changes, double vision, eye pain Ear, nose and throat: No hearing loss, ear pain, nasal congestion, sore throat Cardiovascular: No chest pain, palpitations Respiratory:  No shortness of breath at rest or with exertion, wheezes GastrointestinaI: No nausea, vomiting, diarrhea, abdominal pain, fecal incontinence Genitourinary:  No dysuria, urinary retention or frequency Musculoskeletal:  + neck pain, back pain Integumentary: No rash, pruritus, skin lesions Neurological: as above Psychiatric: No depression, insomnia, anxiety Endocrine: No palpitations, fatigue, diaphoresis, mood swings, change in appetite, change in weight, increased thirst Hematologic/Lymphatic:  No anemia, purpura, petechiae. Allergic/Immunologic: no itchy/runny eyes, nasal congestion, recent allergic reactions, rashes  PHYSICAL EXAM: Vitals:   12/07/15 1033  BP: 130/80  Pulse: 82   General: No acute distress Head:  Normocephalic/atraumatic Eyes: Fundoscopic exam shows bilateral sharp discs, no vessel changes, exudates, or hemorrhages Neck: supple, no paraspinal tenderness, full range of motion Back: No paraspinal tenderness Heart: regular rate and rhythm Lungs: Clear to auscultation bilaterally. Vascular: No carotid bruits. Skin/Extremities: No rash, no edema Neurological Exam: Mental status: alert and oriented to person, place, month/year, no dysarthria or aphasia, Fund of knowledge is appropriate.  Remote memory intact.  Attention and concentration are normal.    Able to name objects and repeat phrases. Montreal Cognitive Assessment  12/07/2015  Visuospatial/ Executive (0/5) 4  Naming (0/3) 3  Attention: Read list of digits (0/2) 1  Attention: Read list of letters (0/1) 1  Attention: Serial 7 subtraction starting at 100 (0/3) 3  Language: Repeat phrase (0/2) 2  Language : Fluency (0/1) 0  Abstraction (0/2) 2  Delayed Recall (0/5) 0  Orientation (0/6) 4  Total 20  Adjusted Score (based on education) 20    Cranial nerves: CN I: not tested CN II: pupils equal, round and reactive to light, visual fields intact, fundi unremarkable. CN III, IV, VI:  full range of motion, no nystagmus, no ptosis CN V: facial sensation intact CN VII: upper and lower face symmetric CN VIII: hearing intact to finger rub  CN IX, X: gag intact, uvula midline CN XI: sternocleidomastoid and trapezius muscles intact CN XII: tongue midline Bulk & Tone: normal, no fasciculations. Motor: 5/5 throughout with no pronator drift. Sensation: intact to light touch, cold, pin, vibration and joint position sense.  No extinction to double simultaneous stimulation.  Romberg test negative Deep Tendon Reflexes: +1 throughout, no ankle clonus Plantar responses: downgoing bilaterally Cerebellar: no incoordination on finger to nose testing Gait: narrow-based and steady, able to tandem walk adequately. Tremor: none  IMPRESSION: This is a pleasant 82 year old right-handed woman with worsening memory loss. MOCA score today 20/30, history suggestive of mild dementia, likely Alzheimer type, although her brain imaging shows extensive chronic microvascular disease (vascular dementia). Findings were discussed with the patient and her daughter, she may benefit from starting cholinesterase inhibitors such as Aricept. Side effects and expectations from the medication were discussed, she will start 5mg  daily for 1 month, then increase to 10mg  daily. We discussed the importance of control of vascular risk factors, physical exercise, and brain stimulation exercises for brain health. At this point she is living in an independent living facility, and long-term planning with eventual need for nursing home/Memory care was discussed with them, her daughter expressed understanding. She will follow-up in 6 months and knows to call for any changes.   Thank you for allowing me to participate in the care of this patient. Please do not hesitate to call for any  questions or concerns.   Ellouise Newer, M.D.  CC: Dr. Yong Channel

## 2015-12-07 NOTE — Patient Instructions (Addendum)
1. Start Aricept 10mg : Take 1/2 tablet daily for 1 month, increase to 1 tablet daily 2. Physical exercise and brain stimulation exercises are important for brain health 3. Continue to monitor home safety, plan for increased supervision in the near future 4. Follow-up in 6 months  Recommended Books:  1) The 36-Hour Day: A Family Guide to Caring for People Who Have Alzheimer Disease, Related Dementias, and Memory Loss (5th edition) by Oletta Lamas. Mace and Viviano Simas Rabins  2) Understanding Difficult Behaviors: Some practical suggestions for coping with Alzheimer's disease and related illnesses by Loraine Grip and Jake Seats  3) A Caregiver's Guide to Dementia: Using Activities and Other Strategies to Prevent, Reduce, and Manage Behavioral Symptoms by Osie Bond Tyler Aas and [employer]

## 2016-01-17 ENCOUNTER — Other Ambulatory Visit: Payer: Self-pay | Admitting: Surgery

## 2016-01-17 DIAGNOSIS — Z1231 Encounter for screening mammogram for malignant neoplasm of breast: Secondary | ICD-10-CM

## 2016-01-23 ENCOUNTER — Other Ambulatory Visit: Payer: Self-pay | Admitting: Family Medicine

## 2016-01-24 NOTE — Telephone Encounter (Signed)
Yes thanks 

## 2016-01-31 ENCOUNTER — Other Ambulatory Visit: Payer: Self-pay | Admitting: Surgery

## 2016-01-31 ENCOUNTER — Ambulatory Visit
Admission: RE | Admit: 2016-01-31 | Discharge: 2016-01-31 | Disposition: A | Discharge status: Home / Self Care | Payer: Medicare Other | Source: Ambulatory Visit | Attending: Surgery | Admitting: Surgery

## 2016-01-31 ENCOUNTER — Ambulatory Visit (INDEPENDENT_AMBULATORY_CARE_PROVIDER_SITE_OTHER): Payer: Medicare Other

## 2016-01-31 DIAGNOSIS — N6320 Unspecified lump in the left breast, unspecified quadrant: Secondary | ICD-10-CM

## 2016-01-31 DIAGNOSIS — N6452 Nipple discharge: Secondary | ICD-10-CM

## 2016-01-31 DIAGNOSIS — R928 Other abnormal and inconclusive findings on diagnostic imaging of breast: Secondary | ICD-10-CM | POA: Diagnosis not present

## 2016-01-31 DIAGNOSIS — Z23 Encounter for immunization: Secondary | ICD-10-CM

## 2016-01-31 DIAGNOSIS — N63 Unspecified lump in breast: Secondary | ICD-10-CM | POA: Diagnosis not present

## 2016-01-31 DIAGNOSIS — Z1231 Encounter for screening mammogram for malignant neoplasm of breast: Secondary | ICD-10-CM

## 2016-02-01 ENCOUNTER — Other Ambulatory Visit: Payer: Self-pay | Admitting: Surgery

## 2016-02-01 DIAGNOSIS — N6320 Unspecified lump in the left breast, unspecified quadrant: Secondary | ICD-10-CM

## 2016-02-01 DIAGNOSIS — N6452 Nipple discharge: Secondary | ICD-10-CM

## 2016-02-04 ENCOUNTER — Ambulatory Visit
Admission: RE | Admit: 2016-02-04 | Discharge: 2016-02-04 | Disposition: A | Discharge status: Home / Self Care | Payer: Medicare Other | Source: Ambulatory Visit | Attending: Surgery | Admitting: Surgery

## 2016-02-04 DIAGNOSIS — N6320 Unspecified lump in the left breast, unspecified quadrant: Secondary | ICD-10-CM

## 2016-02-04 DIAGNOSIS — N6452 Nipple discharge: Secondary | ICD-10-CM

## 2016-02-04 DIAGNOSIS — N63 Unspecified lump in breast: Secondary | ICD-10-CM | POA: Diagnosis not present

## 2016-02-04 DIAGNOSIS — N6012 Diffuse cystic mastopathy of left breast: Secondary | ICD-10-CM | POA: Diagnosis not present

## 2016-02-18 ENCOUNTER — Telehealth: Payer: Self-pay | Admitting: Family Medicine

## 2016-02-18 NOTE — Telephone Encounter (Signed)
Pharm is calling pt is gong out of town Thursday and would like early refill on diazepam send to [facility]

## 2016-02-19 ENCOUNTER — Encounter: Payer: Self-pay | Admitting: Family Medicine

## 2016-02-19 ENCOUNTER — Ambulatory Visit (INDEPENDENT_AMBULATORY_CARE_PROVIDER_SITE_OTHER): Payer: Medicare Other | Admitting: Family Medicine

## 2016-02-19 VITALS — BP 108/76 | HR 67 | Temp 98.2°F | Wt 122.6 lb

## 2016-02-19 DIAGNOSIS — R413 Other amnesia: Secondary | ICD-10-CM

## 2016-02-19 DIAGNOSIS — J438 Other emphysema: Secondary | ICD-10-CM

## 2016-02-19 MED ORDER — BUDESONIDE-FORMOTEROL FUMARATE 160-4.5 MCG/ACT IN AERO: 2.0000 | INHALATION_SPRAY | Freq: Two times a day (BID) | RESPIRATORY_TRACT | 12 refills | Status: AC

## 2016-02-19 NOTE — Assessment & Plan Note (Signed)
S: notable on exam today- repeats complaints including breast pain after recent biopsy as well as trouble breathing A/P: would like to repeat mmse if patient remembers hearing aids but she simply does not usually understand me and always looks to daughter to reiterate what I say- every visit states her intent to bring hearing aids next time and does not

## 2016-02-19 NOTE — Progress Notes (Signed)
Subjective:  [name] [name] is a 82 y.o. year old very pleasant female patient who presents for/with See problem oriented charting ROS- no chest pain. No cough or congestion. Mild runny nose.see any ROS included in HPI as well.   Past Medical History-  Patient Active Problem List   Diagnosis Date Noted  . Memory loss 04/12/2014    Priority: High  . History of CVA (cerebrovascular accident) 07/29/2013    Priority: High  . Pulmonary nodules 07/29/2013    Priority: High  . COPD with emphysema Gold B 01/04/2010    Priority: High  . Insomnia 11/21/2013    Priority: Medium  . Recurrent UTI 07/13/2013    Priority: Medium  . History of breast cancer, T1b, N0, Lumpectomy 07/21/2007. 04/21/2011    Priority: Medium  . Essential hypertension 11/12/2006    Priority: Medium  . Allergic rhinitis 04/12/2014    Priority: Low  . Constipation 04/12/2014    Priority: Low  . Raynaud's syndrome 04/25/2009    Priority: Low  . GERD 04/19/2007    Priority: Low  . Mild dementia 12/07/2015  . Chest pain with moderate risk for cardiac etiology 07/25/2014  . Osteoarthritis, knee 04/12/2014    Medications- reviewed and updated Current Outpatient Prescriptions  Medication Sig Dispense Refill  . albuterol (PROVENTIL HFA;VENTOLIN HFA) 108 (90 Base) MCG/ACT inhaler Inhale 2 puffs into the lungs every 6 (six) hours as needed for wheezing or shortness of breath. 1 Inhaler 0  . amLODipine (NORVASC) 2.5 MG tablet TAKE 1 TABLET BY MOUTH EVERY DAY FOR HTN 90 tablet 2  . aspirin 325 MG tablet Take 325 mg by mouth daily.    . diazepam (VALIUM) 5 MG tablet TAKE 1 TABLET BY MOUTH AT BEDTIME 30 tablet 2  . donepezil (ARICEPT) 10 MG tablet Take 1/2 tablet daily for 1 month, then increase to 1 tablet daily 30 tablet 11  . fluticasone (FLONASE) 50 MCG/ACT nasal spray Place 2 sprays into both nostrils daily.    Marland Kitchen losartan (COZAAR) 50 MG tablet TAKE 1 TABLET BY MOUTH EVERY DAY 90 tablet 2  . nitroGLYCERIN (NITROSTAT) 0.4 MG  SL tablet Place 1 tablet (0.4 mg total) under the tongue every 5 (five) minutes as needed for chest pain (take as directed). 25 tablet 3  . budesonide-formoterol (SYMBICORT) 160-4.5 MCG/ACT inhaler Inhale 2 puffs into the lungs 2 (two) times daily. 1 Inhaler 12   No current facility-administered medications for this visit.     Objective: BP 108/76 (BP Location: Left Arm, Patient Position: Sitting, Cuff Size: Normal)   Pulse 67   Temp 98.2 F (36.8 C) (Oral)   Wt 122 lb 9.6 oz (55.6 kg)   SpO2 94%   BMI 22.42 kg/m  Gen: NAD, resting comfortably CV: RRR no murmurs rubs or gallops Lungs: CTAB no crackles, wheeze, rhonchi Ext: no edema Skin: warm, dry, bruised left breast after biopsy- daughter opted to be chaperone Neuro: grossly normal, moves all extremities  Assessment/Plan:   COPD with emphysema Gold B S: patient continues to use oxygen at night and no issues at night. 02 level normal today. Prior saw Dr. Joya Gaskins of pulmonary. She is not using albuterol at all. She states in the daytime she feels like she cannot breath well when active and at times she feels fatigued in her legs when she is having trouble breathing. Daughter is with her today and we are able to discover that mother is not taking her symbicort as she is supposed to- she states  she uses a squeezer but is referring to nasal flonase and not to inhalers.  A/P: prolonged discussion today to reorient patien tto fact that her COPD may be causing her symptoms and that she needs to use regular inhaler symbicort in order to better control daytime symptoms. Sent in new rx and daughter will try to reinforce the need for this medication to mother. Continue nighttime oxygen.    Memory loss S: notable on exam today- repeats complaints including breast pain after recent biopsy as well as trouble breathing A/P: would like to repeat mmse if patient remembers hearing aids but she simply does not usually understand me and always looks to  daughter to reiterate what I say- every visit states her intent to bring hearing aids next time and does not  Also discussed icing or tylenol for breast with some need for recovery time of several weeks  Meds ordered this encounter  Medications  . budesonide-formoterol (SYMBICORT) 160-4.5 MCG/ACT inhaler    Sig: Inhale 2 puffs into the lungs 2 (two) times daily.    Dispense:  1 Inhaler    Refill:  12    Return precautions advised.  Garret Reddish, MD

## 2016-02-19 NOTE — Assessment & Plan Note (Signed)
S: patient continues to use oxygen at night and no issues at night. 02 level normal today. Prior saw Dr. Joya Gaskins of pulmonary. She is not using albuterol at all. She states in the daytime she feels like she cannot breath well when active and at times she feels fatigued in her legs when she is having trouble breathing. Daughter is with her today and we are able to discover that mother is not taking her symbicort as she is supposed to- she states she uses a squeezer but is referring to nasal flonase and not to inhalers.  A/P: prolonged discussion today to reorient patien tto fact that her COPD may be causing her symptoms and that she needs to use regular inhaler symbicort in order to better control daytime symptoms. Sent in new rx and daughter will try to reinforce the need for this medication to mother. Continue nighttime oxygen.

## 2016-02-19 NOTE — Progress Notes (Signed)
Pre visit review using our clinic review tool, if applicable. No additional management support is needed unless otherwise documented below in the visit note. 

## 2016-02-19 NOTE — Patient Instructions (Signed)
Let's start symbicort twice a day every day to help with your breathing. Rinse your mouth out with water each time you do this.   Follow up with me if not feeling better in regards to breathing.

## 2016-02-19 NOTE — Telephone Encounter (Signed)
Pharmacy was called and given the ok to fill early.

## 2016-02-22 ENCOUNTER — Encounter (HOSPITAL_COMMUNITY): Payer: Self-pay | Admitting: Emergency Medicine

## 2016-02-22 ENCOUNTER — Inpatient Hospital Stay (HOSPITAL_COMMUNITY)
Admission: EM | Admit: 2016-02-22 | Discharge: 2016-02-26 | DRG: 312 | Disposition: A | Discharge status: Home Health | Payer: Medicare Other | Attending: Family Medicine | Admitting: Family Medicine

## 2016-02-22 DIAGNOSIS — J449 Chronic obstructive pulmonary disease, unspecified: Secondary | ICD-10-CM | POA: Diagnosis not present

## 2016-02-22 DIAGNOSIS — I480 Paroxysmal atrial fibrillation: Secondary | ICD-10-CM | POA: Diagnosis present

## 2016-02-22 DIAGNOSIS — N390 Urinary tract infection, site not specified: Secondary | ICD-10-CM | POA: Diagnosis not present

## 2016-02-22 DIAGNOSIS — I2510 Atherosclerotic heart disease of native coronary artery without angina pectoris: Secondary | ICD-10-CM | POA: Diagnosis present

## 2016-02-22 DIAGNOSIS — Z87440 Personal history of urinary (tract) infections: Secondary | ICD-10-CM

## 2016-02-22 DIAGNOSIS — Z79899 Other long term (current) drug therapy: Secondary | ICD-10-CM

## 2016-02-22 DIAGNOSIS — R110 Nausea: Secondary | ICD-10-CM | POA: Diagnosis not present

## 2016-02-22 DIAGNOSIS — F03A0 Unspecified dementia, mild, without behavioral disturbance, psychotic disturbance, mood disturbance, and anxiety: Secondary | ICD-10-CM | POA: Diagnosis present

## 2016-02-22 DIAGNOSIS — G4733 Obstructive sleep apnea (adult) (pediatric): Secondary | ICD-10-CM | POA: Diagnosis present

## 2016-02-22 DIAGNOSIS — Z8673 Personal history of transient ischemic attack (TIA), and cerebral infarction without residual deficits: Secondary | ICD-10-CM

## 2016-02-22 DIAGNOSIS — I110 Hypertensive heart disease with heart failure: Secondary | ICD-10-CM | POA: Diagnosis not present

## 2016-02-22 DIAGNOSIS — R5381 Other malaise: Secondary | ICD-10-CM | POA: Diagnosis present

## 2016-02-22 DIAGNOSIS — Z85820 Personal history of malignant melanoma of skin: Secondary | ICD-10-CM

## 2016-02-22 DIAGNOSIS — Z87891 Personal history of nicotine dependence: Secondary | ICD-10-CM

## 2016-02-22 DIAGNOSIS — K5909 Other constipation: Secondary | ICD-10-CM | POA: Diagnosis present

## 2016-02-22 DIAGNOSIS — I10 Essential (primary) hypertension: Secondary | ICD-10-CM | POA: Diagnosis present

## 2016-02-22 DIAGNOSIS — M6281 Muscle weakness (generalized): Secondary | ICD-10-CM

## 2016-02-22 DIAGNOSIS — R0600 Dyspnea, unspecified: Secondary | ICD-10-CM | POA: Diagnosis not present

## 2016-02-22 DIAGNOSIS — Z853 Personal history of malignant neoplasm of breast: Secondary | ICD-10-CM

## 2016-02-22 DIAGNOSIS — K2970 Gastritis, unspecified, without bleeding: Secondary | ICD-10-CM | POA: Diagnosis not present

## 2016-02-22 DIAGNOSIS — I5032 Chronic diastolic (congestive) heart failure: Secondary | ICD-10-CM | POA: Diagnosis not present

## 2016-02-22 DIAGNOSIS — Z7951 Long term (current) use of inhaled steroids: Secondary | ICD-10-CM

## 2016-02-22 DIAGNOSIS — Z7982 Long term (current) use of aspirin: Secondary | ICD-10-CM

## 2016-02-22 DIAGNOSIS — R42 Dizziness and giddiness: Secondary | ICD-10-CM | POA: Diagnosis not present

## 2016-02-22 DIAGNOSIS — F0390 Unspecified dementia without behavioral disturbance: Secondary | ICD-10-CM | POA: Diagnosis present

## 2016-02-22 DIAGNOSIS — R55 Syncope and collapse: Principal | ICD-10-CM

## 2016-02-22 DIAGNOSIS — J439 Emphysema, unspecified: Secondary | ICD-10-CM | POA: Diagnosis present

## 2016-02-22 DIAGNOSIS — K5900 Constipation, unspecified: Secondary | ICD-10-CM | POA: Diagnosis present

## 2016-02-22 LAB — BASIC METABOLIC PANEL
Anion gap: 6 (ref 5–15)
BUN: 16 mg/dL (ref 6–20)
CO2: 28 mmol/L (ref 22–32)
CREATININE: 0.89 mg/dL (ref 0.44–1.00)
Calcium: 8.9 mg/dL (ref 8.9–10.3)
Chloride: 104 mmol/L (ref 101–111)
GFR calc Af Amer: >60 mL/min (ref >60)
GFR, EST NON AFRICAN AMERICAN: 55 mL/min — AB (ref >60)
Glucose, Bld: 124 mg/dL — ABNORMAL HIGH (ref 65–99)
POTASSIUM: 3.9 mmol/L (ref 3.5–5.1)
SODIUM: 138 mmol/L (ref 135–145)

## 2016-02-22 LAB — URINALYSIS, ROUTINE W REFLEX MICROSCOPIC
Glucose, UA: NEGATIVE mg/dL
Hgb urine dipstick: NEGATIVE
KETONES UR: NEGATIVE mg/dL
NITRITE: POSITIVE — AB
PROTEIN: 30 mg/dL — AB
Specific Gravity, Urine: 1.022 (ref 1.005–1.030)
pH: 5.5 (ref 5.0–8.0)

## 2016-02-22 LAB — CBC
HEMATOCRIT: 39.9 % (ref 36.0–46.0)
Hemoglobin: 13.1 g/dL (ref 12.0–15.0)
MCH: 28.7 pg (ref 26.0–34.0)
MCHC: 32.8 g/dL (ref 30.0–36.0)
MCV: 87.3 fL (ref 78.0–100.0)
PLATELETS: 191 10*3/uL (ref 150–400)
RBC: 4.57 MIL/uL (ref 3.87–5.11)
RDW: 14.2 % (ref 11.5–15.5)
WBC: 11.5 10*3/uL — AB (ref 4.0–10.5)

## 2016-02-22 LAB — URINE MICROSCOPIC-ADD ON: RBC / HPF: NONE SEEN RBC/hpf (ref 0–5)

## 2016-02-22 LAB — CBG MONITORING, ED: GLUCOSE-CAPILLARY: 84 mg/dL (ref 65–99)

## 2016-02-22 NOTE — ED Provider Notes (Signed)
[facility] DEPT Provider Note   CSN: [id number] Arrival date & time: 02/22/16  2251 By signing my name below, I, Dyke Brackett, attest that this documentation has been prepared under the direction and in the presence of No att. providers found . Electronically Signed: Dyke Brackett, Scribe. 02/23/16 3:13 AM .   History   Chief Complaint Chief Complaint  Patient presents with  . Loss of Consciousness  . Constipation   HPI Ashleymarie Dinsmore is a 82 y.o. female with hx of CAD, COPD, Colitis, chronic constipation, memory loss, and mild dementia who presents to the Emergency Department complaining of loss of consciousness tonight PTA. Per pt's boyfriend, pt went to the restroom and was later found on the floor when a friend went to check on her. Pt states she was trying to have a bowel movement, felt lightheaded, and had syncopal episode. She had urinated some before she passed out. She notes associated SOB, nausea, and lower abdominal pain. No alleviating or modifying factors noted. Pt denies hx of MI. Pt is on 2L oxygen at night. She is a former smoker and denies any tobacco use. Pt denies any CP.   The history is provided by the patient. No language interpreter was used.   Past Medical History:  Diagnosis Date  . Airway obstruction   . Allergic rhinitis   . Anal stricture   . ANXIETY 08/16/2008  . Atrial fibrillation ([place])    she denies known hx of afib 02/24/13  . CAD, UNSPECIFIED SITE 10/17/2008  . Candida esophagitis ([place])   . CHOLELITHIASIS 08/23/2008  . Chronic constipation   . Colitis    sigmoid  . Complication of anesthesia    difficult intubation  . COPD 01/04/2010   FeV1 101%, DLCO64% 2008  . COPD (chronic obstructive pulmonary disease) ([place]) 01/04/2010  . 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 Diverticulitis   . DIVERTICULOSIS-COLON 04/07/2008  . Emphysema of lung ([place])   . Esophageal stricture   . Fatty liver   . GERD 04/19/2007  . Hearing aid worn    bilateral  . Hiatal hernia   . History of diverticulitis of colon   . HYPERSOMNIA, ASSOCIATED WITH SLEEP APNEA 02/01/2007  . HYPERTENSION 11/12/2006  . IBS (irritable bowel syndrome)   . MITRAL VALVE PROLAPSE, HX OF 10/07/2008  . NEOPLASM, MALIGNANT, BREAST, RIGHT 02/15/2008  . OSTEOPOROSIS 04/04/2008  . Pulmonary fibrosis ([place])   . Raynaud's syndrome 04/25/2009  . Sleep apnea   . Tortuous colon     Patient Active Problem List   Diagnosis Date Noted  . Syncope 02/23/2016  . Mild dementia 12/07/2015  . Chest pain with moderate risk for cardiac etiology 07/25/2014  . Allergic rhinitis 04/12/2014  . Constipation 04/12/2014  . Memory loss 04/12/2014  . Osteoarthritis, knee 04/12/2014  . Insomnia 11/21/2013  . History of CVA (cerebrovascular accident) 07/29/2013  . Pulmonary nodules 07/29/2013  . Recurrent UTI 07/13/2013  . History of breast cancer, T1b, N0, Lumpectomy 07/21/2007. 04/21/2011  . COPD with emphysema Gold B 01/04/2010  . Raynaud's syndrome 04/25/2009  . GERD 04/19/2007  . Essential hypertension 11/12/2006    Past Surgical History:  Procedure Laterality Date  . ABDOMINAL HYSTERECTOMY    . APPENDECTOMY    . BILATERAL SALPINGOOPHORECTOMY     s/p prolapsed bladder  . BLADDER SURGERY     prolapsed  . BREAST CYST EXCISION     left  . BREAST SURGERY  07-21-07   mastectomy (right), all margins neg and LNs neg   .  EYE SURGERY     cataract removal bilateral  . LUNG SURGERY    . MASTECTOMY     right  . MELANOMA EXCISION     rt clavicle Dr Rhoderick [facility] office)  . MOUTH SURGERY    . ROTATOR CUFF REPAIR    . THORACOTOMY     granuloma, pulmonary-thoracotomy  . TONSILLECTOMY      OB History    No data available     Home Medications    Prior to Admission medications   Medication Sig Start Date End Date Taking? Authorizing Provider  albuterol (PROVENTIL HFA;VENTOLIN HFA) 108 (90 Base) MCG/ACT inhaler Inhale 2 puffs into the lungs every 6 (six) hours as needed for  wheezing or shortness of breath. 11/23/15  Yes Dorothyann Peng, NP  aspirin 325 MG tablet Take 325 mg by mouth at bedtime.    Yes Historical Provider, MD  budesonide-formoterol (SYMBICORT) 160-4.5 MCG/ACT inhaler Inhale 2 puffs into the lungs 2 (two) times daily. 02/19/16  Yes Marin Olp, MD  diazepam (VALIUM) 5 MG tablet TAKE 1 TABLET BY MOUTH AT BEDTIME Patient taking differently: TAKE 1 MG BY MOUTH AT BEDTIME 01/24/16  Yes Marin Olp, MD  donepezil (ARICEPT) 10 MG tablet Take 1/2 tablet daily for 1 month, then increase to 1 tablet daily Patient taking differently: Take 10 mg by mouth at bedtime.  12/07/15  Yes Cameron Sprang, MD  fluticasone [facility]) 50 MCG/ACT nasal spray Place 2 sprays into both nostrils daily. Patient taking differently: Place 2 sprays into both nostrils at bedtime.  11/19/15  Yes Delano Metz, FNP  nitroGLYCERIN (NITROSTAT) 0.4 MG SL tablet Place 1 tablet (0.4 mg total) under the tongue every 5 (five) minutes as needed for chest pain (take as directed). 04/12/15  Yes Fay Records, MD  cephALEXin (KEFLEX) 500 MG capsule Take 1 capsule (500 mg total) by mouth every 12 (twelve) hours. 02/24/16   Nita Sells, MD    Family History Family History  Problem Relation Age of Onset  . Parkinsonism Father   . Coronary artery disease Father   . Stroke Mother   . Heart failure Mother   . Breast cancer      aunt  . Colon cancer      aunt, age 22  . Uterine cancer      aunt  . Breast cancer Maternal Aunt   . Colon cancer Maternal Aunt   . Uterine cancer Maternal Aunt   . Lung disease Neg Hx     Social History Social History  Substance Use Topics  . Smoking status: Former Smoker    Packs/day: 2.00    Years: 30.00    Types: Cigarettes    Quit date: 05/13/1983  . Smokeless tobacco: Never Used  . Alcohol use 0.0 oz/week     Comment: 1-2 glaases wine per day    Allergies   Azithromycin; Ciprofloxacin; Levaquin [levofloxacin]; Penicillins; and  Sulfamethoxazole  Review of Systems Review of Systems 10 Systems reviewed and are negative for acute change except as noted in the HPI.  Physical Exam Updated Vital Signs BP (!) 176/80 (BP Location: Left Arm)   Pulse 69   Temp 97.5 F (36.4 C) (Oral)   Resp 16   Ht 5\' 2"  (1.575 m)   Wt 120 lb 13 oz (54.8 kg)   SpO2 94%   BMI 22.10 kg/m   Physical Exam  Constitutional: She is oriented to person, place, and time. She appears well-developed and well-nourished.  HENT:  Head: Normocephalic.  Eyes: EOM are normal.  Neck: Normal range of motion. No JVD present.  Cardiovascular: Tachycardia present.   Pulmonary/Chest: Effort normal. She has no wheezes. She has no rales.  Lungs clear to auscultation bilaterally   Abdominal: She exhibits no distension and no mass. There is tenderness.  Firm, tenderness to palpation in lower quadrants, no palpable mass  Musculoskeletal: Normal range of motion. She exhibits no edema.  No unilateral leg swelling or pitting edema.   Neurological: She is alert and oriented to person, place, and time.  Psychiatric: She has a normal mood and affect.  Nursing note and vitals reviewed.  ED Treatments / Results  DIAGNOSTIC STUDIES:  Oxygen Saturation is 90% on [place] 3 L/min, low by my interpretation.    COORDINATION OF CARE:  11:35 PM Discussed treatment plan with pt at bedside and pt agreed to plan.   Labs (all labs ordered are listed, but only abnormal results are displayed) Labs Reviewed  URINE CULTURE - Abnormal; Notable for the following:       Result Value   Culture MULTIPLE SPECIES PRESENT, SUGGEST RECOLLECTION (*)    All other components within normal limits  BASIC METABOLIC PANEL - Abnormal; Notable for the following:    Glucose, Bld 124 (*)    GFR calc non Af Amer 55 (*)    All other components within normal limits  CBC - Abnormal; Notable for the following:    WBC 11.5 (*)    All other components within normal limits  URINALYSIS, ROUTINE  W REFLEX MICROSCOPIC (NOT AT [facility]) - Abnormal; Notable for the following:    Color, Urine ORANGE (*)    APPearance CLOUDY (*)    Bilirubin Urine SMALL (*)    Protein, ur 30 (*)    Nitrite POSITIVE (*)    Leukocytes, UA LARGE (*)    All other components within normal limits  URINE MICROSCOPIC-ADD ON - Abnormal; Notable for the following:    Squamous Epithelial / LPF 0-5 (*)    Bacteria, UA FEW (*)    All other components within normal limits  GLUCOSE, CAPILLARY - Abnormal; Notable for the following:    Glucose-Capillary 134 (*)    All other components within normal limits  CBC WITH DIFFERENTIAL/PLATELET - Abnormal; Notable for the following:    Hemoglobin 11.0 (*)    HCT 33.5 (*)    All other components within normal limits  MRSA PCR SCREENING  GLUCOSE, CAPILLARY  GLUCOSE, CAPILLARY  TROPONIN I  TROPONIN I  TROPONIN I  GLUCOSE, CAPILLARY  CBG MONITORING, ED   EKG  EKG Interpretation  Date/Time:  Friday February 22 2016 22:59:38 EDT Ventricular Rate:  67 PR Interval:    QRS Duration: 90 QT Interval:  396 QTC Calculation: 418 R Axis:   -16 Text Interpretation:  Sinus rhythm Prolonged PR interval Low voltage, extremity and precordial leads Borderline ST depression, lateral leads Inferior Q waves noted No acute changes Confirmed by Kathrynn Humble, MD, Thelma Comp [id number]) on 02/22/2016 11:40:37 PM       Radiology No results found.  Procedures Procedures (including critical care time)  Medications Ordered in ED Medications  cefTRIAXone (ROCEPHIN) 1 g in dextrose 5 % 50 mL IVPB (0 g Intravenous Stopped 02/23/16 0257)  Glycerin (Adult) 2.1 g suppository 1 suppository (1 suppository Rectal Given 02/23/16 0512)  sodium chloride 0.9 % bolus 1,000 mL (1,000 mLs Intravenous Given 02/23/16 [id number])     Initial Impression / Assessment and Plan / ED Course  I have reviewed the triage vital signs and the nursing notes.  Pertinent labs & imaging results that were available during my care of  the patient were reviewed by me and considered in my medical decision making (see chart for details).  Clinical Course  Comment By Time  Upon attempt at ambulatory post ox, pt's O2 stats dropped into low 90's and she started having weakness in legs. Pt had to be helped back into her room. She states that she did not feel stable at all. We will admit for syncope and UTI.  Dyke Brackett 10/14 [id number]   DDx includes: Orthostatic hypotension Stroke Vertebral artery dissection/stenosis Dysrhythmia PE Vasovagal/neurocardiogenic syncope Aortic stenosis Valvular disorder/Cardiomyopathy Anemia  PT comes in with syncope and weakness. No significant cardiac hx, which is reassuring. Will initiate basic workup and have patient on telemetry. D/c vs. obs admission.  Final Clinical Impressions(s) / ED Diagnoses   Final diagnoses:  Syncope and collapse  Urinary tract infection without hematuria, site unspecified    New Prescriptions Discharge Medication List as of 02/26/2016 12:10 PM    START taking these medications   Details  cephALEXin (KEFLEX) 500 MG capsule Take 1 capsule (500 mg total) by mouth every 12 (twelve) hours., Starting Sun 02/24/2016, Normal         Varney Biles, MD 03/03/16 [phone]

## 2016-02-22 NOTE — ED Notes (Signed)
EKG given to EDP,Pfeiffer,MD., for review. 

## 2016-02-22 NOTE — ED Triage Notes (Signed)
Pt comes to ed, after having a fainting spell possible fall unwitnessed.  Pt is alert x4 , 20 in left arm,

## 2016-02-22 NOTE — ED Notes (Signed)
Per EMS- Friend found her laying on floor. Pt has been constipated, trying to move bowels, felt lightheaded, let self to floor, and passed out. C/o lower abdominal pain, nausea.

## 2016-02-22 NOTE — ED Notes (Signed)
Bed: [id number] Expected date:  Expected time:  Means of arrival:  Comments: EMS 82 yo female syncopal episode while sitting on toliet trying to have a BM

## 2016-02-23 ENCOUNTER — Emergency Department (HOSPITAL_COMMUNITY): Payer: Medicare Other

## 2016-02-23 ENCOUNTER — Encounter (HOSPITAL_COMMUNITY): Payer: Self-pay | Admitting: Family Medicine

## 2016-02-23 ENCOUNTER — Observation Stay (HOSPITAL_BASED_OUTPATIENT_CLINIC_OR_DEPARTMENT_OTHER): Payer: Medicare Other

## 2016-02-23 DIAGNOSIS — R55 Syncope and collapse: Secondary | ICD-10-CM | POA: Diagnosis present

## 2016-02-23 DIAGNOSIS — I10 Essential (primary) hypertension: Secondary | ICD-10-CM

## 2016-02-23 DIAGNOSIS — K5909 Other constipation: Secondary | ICD-10-CM

## 2016-02-23 DIAGNOSIS — F0390 Unspecified dementia without behavioral disturbance: Secondary | ICD-10-CM

## 2016-02-23 DIAGNOSIS — J438 Other emphysema: Secondary | ICD-10-CM

## 2016-02-23 DIAGNOSIS — N390 Urinary tract infection, site not specified: Secondary | ICD-10-CM

## 2016-02-23 DIAGNOSIS — R0600 Dyspnea, unspecified: Secondary | ICD-10-CM | POA: Diagnosis not present

## 2016-02-23 LAB — ECHOCARDIOGRAM COMPLETE
AVAREAVTI: 2.86 cm2
AVLVOTPG: 7 mmHg
AVPG: 9 mmHg
AVPKVEL: 146 cm/s
Ao pk vel: 0.91 m/s
Ao-asc: 33 cm
CHL CUP AV PEAK INDEX: 1.85
CHL CUP MV DEC (S): 211
CHL CUP STROKE VOLUME: 34 mL
E decel time: 211 msec
E/e' ratio: 20.2
FS: 38 % (ref 28–44)
Height: 62 in
IV/PV OW: 1.02
LA diam end sys: 30 mm
LA diam index: 1.94 cm/m2
LA vol index: 29.2 mL/m2
LA vol: 45.2 mL
LASIZE: 30 mm
LAVOLA4C: 34.5 mL
LV E/e' medial: 20.2
LV E/e'average: 20.2
LV PW d: 10.5 mm — AB (ref 0.6–1.1)
LV SIMPSON'S DISK: 70
LV TDI E'MEDIAL: 6.31
LV dias vol index: 31 mL/m2
LV dias vol: 48 mL (ref 46–106)
LV e' LATERAL: 6.09 cm/s
LV sys vol index: 9 mL/m2
LV sys vol: 14 mL (ref 14–42)
LVOT area: 3.14 cm2
LVOT peak vel: 133 cm/s
LVOTD: 20 mm
MVPG: 6 mmHg
MVPKAVEL: 111 m/s
MVPKEVEL: 123 m/s
TAPSE: 24.4 mm
TDI e' lateral: 6.09
Weight: 1957.68 oz

## 2016-02-23 LAB — GLUCOSE, CAPILLARY: GLUCOSE-CAPILLARY: 134 mg/dL — AB (ref 65–99)

## 2016-02-23 LAB — MRSA PCR SCREENING: MRSA by PCR: NEGATIVE

## 2016-02-23 MED ORDER — BISACODYL 10 MG RE SUPP
10.0000 mg | Freq: Every day | RECTAL | Status: DC | PRN
  Administered 2016-02-24: 10 mg via RECTAL
  Filled 2016-02-23 (×2): qty 1

## 2016-02-23 MED ORDER — ALBUTEROL SULFATE HFA 108 (90 BASE) MCG/ACT IN AERS: 2.0000 | INHALATION_SPRAY | Freq: Four times a day (QID) | RESPIRATORY_TRACT | Status: DC | PRN

## 2016-02-23 MED ORDER — CEPHALEXIN 500 MG PO CAPS
500.0000 mg | ORAL_CAPSULE | Freq: Two times a day (BID) | ORAL | Status: DC
  Administered 2016-02-23 – 2016-02-26 (×6): 500 mg via ORAL
  Filled 2016-02-23 (×6): qty 1

## 2016-02-23 MED ORDER — MOMETASONE FURO-FORMOTEROL FUM 200-5 MCG/ACT IN AERO
2.0000 | INHALATION_SPRAY | Freq: Two times a day (BID) | RESPIRATORY_TRACT | Status: DC
  Administered 2016-02-23 – 2016-02-25 (×6): 2 via RESPIRATORY_TRACT
  Filled 2016-02-23: qty 8.8

## 2016-02-23 MED ORDER — AMLODIPINE BESYLATE 5 MG PO TABS: 2.5000 mg | ORAL_TABLET | Freq: Every day | ORAL | Status: DC

## 2016-02-23 MED ORDER — SODIUM CHLORIDE 0.9% FLUSH
3.0000 mL | Freq: Two times a day (BID) | INTRAVENOUS | Status: DC
Start: 2016-02-23 — End: 2016-02-26
  Administered 2016-02-23 – 2016-02-26 (×6): 3 mL via INTRAVENOUS

## 2016-02-23 MED ORDER — SENNA 8.6 MG PO TABS
1.0000 | ORAL_TABLET | Freq: Two times a day (BID) | ORAL | Status: DC
  Administered 2016-02-23 – 2016-02-26 (×7): 8.6 mg via ORAL
  Filled 2016-02-23 (×7): qty 1

## 2016-02-23 MED ORDER — GLYCERIN (LAXATIVE) 2.1 G RE SUPP
1.0000 | Freq: Once | RECTAL | Status: AC
  Administered 2016-02-23: 1 via RECTAL
  Filled 2016-02-23: qty 1

## 2016-02-23 MED ORDER — ACETAMINOPHEN 650 MG RE SUPP: 650.0000 mg | Freq: Four times a day (QID) | RECTAL | Status: DC | PRN

## 2016-02-23 MED ORDER — ORAL CARE MOUTH RINSE
15.0000 mL | Freq: Two times a day (BID) | OROMUCOSAL | Status: DC
  Administered 2016-02-23 – 2016-02-26 (×6): 15 mL via OROMUCOSAL

## 2016-02-23 MED ORDER — ENOXAPARIN SODIUM 40 MG/0.4ML ~~LOC~~ SOLN
40.0000 mg | SUBCUTANEOUS | Status: DC
  Administered 2016-02-23 – 2016-02-26 (×4): 40 mg via SUBCUTANEOUS
  Filled 2016-02-23 (×4): qty 0.4

## 2016-02-23 MED ORDER — ALBUTEROL SULFATE (2.5 MG/3ML) 0.083% IN NEBU
2.5000 mg | INHALATION_SOLUTION | Freq: Four times a day (QID) | RESPIRATORY_TRACT | Status: DC | PRN
  Administered 2016-02-26: 2.5 mg via RESPIRATORY_TRACT

## 2016-02-23 MED ORDER — SODIUM CHLORIDE 0.9 % IV BOLUS (SEPSIS)
1000.0000 mL | Freq: Once | INTRAVENOUS | Status: AC
  Administered 2016-02-23: 1000 mL via INTRAVENOUS

## 2016-02-23 MED ORDER — DEXTROSE 5 % IV SOLN
1.0000 g | Freq: Once | INTRAVENOUS | Status: AC
  Administered 2016-02-23: 1 g via INTRAVENOUS
  Filled 2016-02-23: qty 10

## 2016-02-23 MED ORDER — DONEPEZIL HCL 10 MG PO TABS
10.0000 mg | ORAL_TABLET | Freq: Every day | ORAL | Status: DC
  Administered 2016-02-23 – 2016-02-25 (×3): 10 mg via ORAL
  Filled 2016-02-23 (×3): qty 1

## 2016-02-23 MED ORDER — ACETAMINOPHEN 325 MG PO TABS
650.0000 mg | ORAL_TABLET | Freq: Four times a day (QID) | ORAL | Status: DC | PRN
Start: 2016-02-23 — End: 2016-02-26
  Administered 2016-02-24: 650 mg via ORAL
  Filled 2016-02-23: qty 2

## 2016-02-23 MED ORDER — LOSARTAN POTASSIUM 50 MG PO TABS: 50.0000 mg | ORAL_TABLET | Freq: Every day | ORAL | Status: DC

## 2016-02-23 MED ORDER — ASPIRIN 325 MG PO TABS
325.0000 mg | ORAL_TABLET | Freq: Every day | ORAL | Status: DC
  Administered 2016-02-23 – 2016-02-25 (×3): 325 mg via ORAL
  Filled 2016-02-23 (×3): qty 1

## 2016-02-23 NOTE — ED Notes (Signed)
Derald [facility]le[facility] [place] contacts  Daughter - cathy Freight forwarder ( POA )  Out of town on business call her on her cell [phone]   Laverna Peace todd [phone], boyfriend local was in room night in wheel chair. Maudie Mercury brust [phone] neighbor ( a Education officer, museum and her neighbor , not her Education officer, museum.   please do not lose her personal walker and her bag of belongings.   Please next nurse update her daughter and neighbor to her room status once she has a bed assignment.

## 2016-02-23 NOTE — ED Notes (Signed)
Pt. Ambulated down the hall and back to her room on 97% room air with walker. Pt. Returning back to her room desating on 92% room air. Pt. Placed back on 2 liter O2.

## 2016-02-23 NOTE — Progress Notes (Signed)
  Brief narrative: 89 ? htn CP ruled out by troponins 07/2014 Prior Br Cancer s/p lumpectomy 2009 Mild COPD Dementia Prior CVA 07/2013  MVP OSA on Cpap Prior acute hepatitis in the past 11/2013 thought to be 2/2 to GE Prior Afib which is paroxysmal and predominant NSR Admitted 02/23/16 with probable urinary infectiona nd syncope  Await cult Cont oral abx Likely can d/c to Independent living with PT follow up   Verneita Griffes, MD  [facility] Pager [phone] 02/23/2016, 12:27 PM    LOS: 0 days

## 2016-02-23 NOTE — H&P (Signed)
History and Physical  Patient Name: [name] [name]     [id number]    DOB: [birth year]    DOA: 02/22/2016 PCP: Garret Reddish, MD   Patient coming from: Independent living facility  Chief Complaint: Syncope  HPI: [name] is a 82 y.o. female with a past medical history significant for HTN, COPD, mild dementia, history of CVA, history of CAD, mitral valve prolapse, OSA CPAP intolerant who presents with syncope.  The patient has been in her usual state of health until this week when she felt intermittent dysuria, urinary frequency and very constipated. This evening she was playing cards with friends, when into the bathroom to have a bowel movement and as she was going to the commode, she felt her legs go out from under her and she passed out.   She cannot describe a prodrome, chest pain, palpitations, or preceding dizziness. She just knows that she started to feel woozy and then collapsed and found himself on the floor. Afterward she was brought to the emergency room.  ED course: -Afebrile, heart rate 90s, respirations and pulse oximetry at baseline, blood pressure 100/55 -Na 138, K 3.9, Cr 0.9, WBC 11.5 K, Hgb 13.1 -Urinalysis showed nitrates, leukocytes TNTC -Chest x-ray was clear -ECG showed sinus rhythm, poor baseline, no obvious ST changes, old inferior Q waves -She was not orthostatic -TRH were asked to evaluate for syncope            ROS: Review of Systems  Constitutional: Negative for chills, fever and malaise/fatigue.  Respiratory: Negative for shortness of breath.   Cardiovascular: Negative for chest pain and palpitations.  Genitourinary: Positive for dysuria and frequency.  Neurological: Positive for loss of consciousness. Negative for dizziness, tingling, speech change and focal weakness.  Psychiatric/Behavioral: Negative for hallucinations.  All other systems reviewed and are negative.         Past Medical History:  Diagnosis Date  . Airway obstruction   .  Allergic rhinitis   . Anal stricture   . ANXIETY 08/16/2008  . Atrial fibrillation ([place])    she denies known hx of afib 02/24/13  . CAD, UNSPECIFIED SITE 10/17/2008  . Candida esophagitis ([place])   . CHOLELITHIASIS 08/23/2008  . Chronic constipation   . Colitis    sigmoid  . Complication of anesthesia    difficult intubation  . COPD 01/04/2010   FeV1 101%, DLCO64% 2008  . COPD (chronic obstructive pulmonary disease) ([place]) 01/04/2010  . 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 Diverticulitis   . DIVERTICULOSIS-COLON 04/07/2008  . Emphysema of lung ([place])   . Esophageal stricture   . Fatty liver   . GERD 04/19/2007  . Hearing aid worn    bilateral  . Hiatal hernia   . History of diverticulitis of colon   . HYPERSOMNIA, ASSOCIATED WITH SLEEP APNEA 02/01/2007  . HYPERTENSION 11/12/2006  . IBS (irritable bowel syndrome)   . MITRAL VALVE PROLAPSE, HX OF 10/07/2008  . NEOPLASM, MALIGNANT, BREAST, RIGHT 02/15/2008  . OSTEOPOROSIS 04/04/2008  . Pulmonary fibrosis ([place])   . Raynaud's syndrome 04/25/2009  . Sleep apnea   . Tortuous colon     Past Surgical History:  Procedure Laterality Date  . ABDOMINAL HYSTERECTOMY    . APPENDECTOMY    . BILATERAL SALPINGOOPHORECTOMY     s/p prolapsed bladder  . BLADDER SURGERY     prolapsed  . BREAST CYST EXCISION     left  . BREAST SURGERY  07-21-07   mastectomy (right), all margins neg and  LNs neg   . EYE SURGERY     cataract removal bilateral  . LUNG SURGERY    . MASTECTOMY     right  . MELANOMA EXCISION     rt clavicle Dr Rhoderick [facility] office)  . MOUTH SURGERY    . ROTATOR CUFF REPAIR    . THORACOTOMY     granuloma, pulmonary-thoracotomy  . TONSILLECTOMY      Social History: Patient lives In a senior independent living facility.  The patient walks without assistance. She is from [place] originally. She went to [employer]. She lived in [place]. She used to smoke until about 20-30 years ago, she smoked heavily.     Allergies  Allergen Reactions  . Azithromycin Hives, Diarrhea, Dermatitis and Rash  . Ciprofloxacin Swelling  . Levaquin [Levofloxacin] Hives  . Penicillins Hives    has had mild doses that she did not have reactions to  Has patient had a PCN reaction causing immediate rash, facial/tongue/throat swelling, SOB or lightheadedness with hypotension:Yes Has patient had a PCN reaction causing severe rash involving mucus membranes or skin necrosis: No Has patient had a PCN reaction that required hospitalization: no Has patient had a PCN reaction occurring within the last 10 years: no If all of the above answers are "NO", then may proceed with Cephalosporin use.   . Sulfamethoxazole Nausea And Vomiting    Family history: family history includes Breast cancer in her maternal aunt; Colon cancer in her maternal aunt; Coronary artery disease in her father; Heart failure in her mother; Parkinsonism in her father; Stroke in her mother; Uterine cancer in her maternal aunt.  Prior to Admission medications   Medication Sig Start Date End Date Taking? Authorizing Provider  albuterol (PROVENTIL HFA;VENTOLIN HFA) 108 (90 Base) MCG/ACT inhaler Inhale 2 puffs into the lungs every 6 (six) hours as needed for wheezing or shortness of breath. 11/23/15  Yes Dorothyann Peng, NP  amLODipine (NORVASC) 2.5 MG tablet TAKE 1 TABLET BY MOUTH EVERY DAY FOR HTN Patient taking differently: TAKE 5 MG BY MOUTH EVERY NIGHT 05/09/15  Yes Marin Olp, MD  aspirin 325 MG tablet Take 325 mg by mouth at bedtime.    Yes Historical Provider, MD  budesonide-formoterol (SYMBICORT) 160-4.5 MCG/ACT inhaler Inhale 2 puffs into the lungs 2 (two) times daily. 02/19/16  Yes Marin Olp, MD  diazepam (VALIUM) 5 MG tablet TAKE 1 TABLET BY MOUTH AT BEDTIME Patient taking differently: TAKE 1 MG BY MOUTH AT BEDTIME 01/24/16  Yes Marin Olp, MD  donepezil (ARICEPT) 10 MG tablet Take 1/2 tablet daily for 1 month, then increase to 1  tablet daily Patient taking differently: Take 10 mg by mouth at bedtime.  12/07/15  Yes Cameron Sprang, MD  fluticasone [facility]) 50 MCG/ACT nasal spray Place 2 sprays into both nostrils daily. Patient taking differently: Place 2 sprays into both nostrils at bedtime.  11/19/15  Yes Delano Metz, FNP  losartan (COZAAR) 50 MG tablet TAKE 1 TABLET BY MOUTH EVERY DAY Patient taking differently: Take 50 mg by mouth every night 09/19/15  Yes Marin Olp, MD  nitroGLYCERIN (NITROSTAT) 0.4 MG SL tablet Place 1 tablet (0.4 mg total) under the tongue every 5 (five) minutes as needed for chest pain (take as directed). 04/12/15  Yes Fay Records, MD       Physical Exam: BP (!) 124/51 (BP Location: Left Arm)   Pulse 66   Temp 98.4 F (36.9 C) (Oral)   Resp 20  SpO2 100%  General appearance: Elderly adult female, alert and in no acute distress, sleeping, easily roused.   Eyes: Anicteric, conjunctiva pink, lids and lashes normal. PERRL.    ENT: No nasal deformity, discharge, epistaxis.  Hearing poor. OP moist without lesions.   Neck: No neck masses.  Trachea midline.  No thyromegaly/tenderness. Lymph: No cervical or supraclavicular lymphadenopathy. Skin: Warm and dry.  No jaundice.  No suspicious rashes or lesions. Cardiac: RRR, nl S1-S2, no murmurs appreciated.  Capillary refill is brisk.  JVP normal.  No LE edema.  Radial and DP pulses 2+ and symmetric. Respiratory: Normal respiratory rate and rhythm.  CTAB without rales or wheezes. Abdomen: Abdomen soft.  Mild suprapubic TTP without guarding. No ascites, distension, hepatosplenomegaly.   MSK: No deformities or effusions.  No cyanosis or clubbing. Neuro: Cranial nerves normal.  Sensation intact to light touch. Speech is fluent.  Muscle strength normal.    Psych: Sensorium intact and responding to questions, attention normal.  Behavior appropriate.  Affect normal.  Judgment and insight appear normal.     Labs on Admission:  I have  personally reviewed following labs and imaging studies: CBC:  Recent Labs Lab 02/22/16 2309  WBC 11.5*  HGB 13.1  HCT 39.9  MCV 87.3  PLT 99991111   Basic Metabolic Panel:  Recent Labs Lab 02/22/16 2309  NA 138  K 3.9  CL 104  CO2 28  GLUCOSE 124*  BUN 16  CREATININE 0.89  CALCIUM 8.9   GFR: Estimated Creatinine Clearance: 33.2 mL/min (by C-G formula based on SCr of 0.89 mg/dL).  Liver Function Tests: No results for input(s): AST, ALT, ALKPHOS, BILITOT, PROT, ALBUMIN in the last 168 hours. No results for input(s): LIPASE, AMYLASE in the last 168 hours. No results for input(s): AMMONIA in the last 168 hours. Coagulation Profile: No results for input(s): INR, PROTIME in the last 168 hours. Cardiac Enzymes: No results for input(s): CKTOTAL, CKMB, CKMBINDEX, TROPONINI in the last 168 hours. BNP (last 3 results) No results for input(s): PROBNP in the last 8760 hours. HbA1C: No results for input(s): HGBA1C in the last 72 hours. CBG:  Recent Labs Lab 02/22/16 2302  GLUCAP 84   Lipid Profile: No results for input(s): CHOL, HDL, LDLCALC, TRIG, CHOLHDL, LDLDIRECT in the last 72 hours. Thyroid Function Tests: No results for input(s): TSH, T4TOTAL, FREET4, T3FREE, THYROIDAB in the last 72 hours. Anemia Panel: No results for input(s): VITAMINB12, FOLATE, FERRITIN, TIBC, IRON, RETICCTPCT in the last 72 hours. Sepsis Labs: Invalid input(s): PROCALCITONIN, LACTICIDVEN No results found for this or any previous visit (from the past 240 hour(s)).       Radiological Exams on Admission: Personally reviewed CXR shows no focal opacity or pneumonia: Dg Chest 2 View  Result Date: 02/23/2016 CLINICAL DATA:  Difficulty breathing.  Patient reports syncope. EXAM: CHEST  2 VIEW COMPARISON:  Chest radiograph 11/23/2015, chest CT 07/24/2013 FINDINGS: Lower lung volumes from prior exam. Stable cardiomediastinal contours with tortuosity and atherosclerosis of the thoracic aorta. Stable  emphysema. Left lung suture with adjacent density, unchanged. No focal airspace disease, pleural effusion or pneumothorax. The bones are under mineralized. Chronic left rib deformity. There is postsurgical change in the right axilla. IMPRESSION: Stable cardiomegaly and postsurgical change in the left hemithorax. Stable bronchitic change. Electronically Signed   By: Jeb Levering M.D.   On: 02/23/2016 01:53    EKG: Independently reviewed. Rate 67, QTc 418, poor baseline, old inferior Q waves.  Echocardiogram 2015:  Mild LVH EF  123456 Grade I diastolic dysfunction             Assessment/Plan  1. Syncope:  Suspect this was from the physiologic stress of UTI.  Has history of coronary disease, otherwise structurally normal heart on echo in 2015.     -Monitor on telemetry -Check echocardiogram -Give gentle IV now -Treat UTI as below    2. Constipation:  -Glycerin suppository once -If no bowel movement by noon, try Bisacodyl and manual stimulation  3. UTI:  -Follow urine culture -Cephalexin 500 mg BID for 7 days  4. HTN:  -Hold amlodipine and Cozaar given soft BP at admission  5. Dementia:  -Continue donepezil  6. COPD:  -Continue Symbicort as formulary alternative -Continue fluticasone  7. History of stroke: -Continue aspirin 325 mg     DVT prophylaxis: Lovenox  Code Status: FULL  Family Communication: None present  Disposition Plan: Anticipate IV fluids, treat UTI, PT eval, and if all normal, home this afternoon or tomorrow. Consults called: None Admission status: OBS, tele At the point of initial evaluation, it is my clinical opinion that admission for OBSERVATION is reasonable and necessary because the patient's presenting complaints in the context of their chronic conditions represent sufficient risk of deterioration or significant morbidity to constitute reasonable grounds for close observation in the hospital setting, but that the patient may be  medically stable for discharge from the hospital within 24 to 48 hours.    Medical decision making: Patient seen at 4:00 AM on 02/23/2016.  The patient was discussed with Dr. Kathrynn Humble.  What exists of the patient's chart was reviewed in depth and summarized above.  Clinical condition: stable.        Edwin Dada [facility] Pager [phone]

## 2016-02-23 NOTE — Evaluation (Signed)
Physical Therapy Evaluation Patient Details Name: [name] [name] MRN: [id number] DOB: [birth year] Today's Date: 02/23/2016   History of Present Illness  [name] [name] is a 82 y.o. female with a past medical history significant for HTN, COPD, mild dementia, history of CVA, history of CAD, mitral valve prolapse, OSA CPAP intolerant who presents with syncope. Pt also reports that urine incontenence is brand new , just yesterdayd n today she is not able to know when she is urinating and has an accident.   Clinical Impression  Pt very pleasant and worried about this new onset of urinary incontinence. Pt does realize she is weak as shown by her decreased ability to mobilize and ambulate as before. She does have to be independent in her apartment, so may need some extra help initially until she gets stronger. Also recommend HHPT to follow.    Follow Up Recommendations Home health PT (PT come to her Independent Living, pt agreed. May need little extra caregiver support at least for the first week or so until she gets her stength back. )    Equipment Recommendations  None recommended by PT    Recommendations for Other Services       Precautions / Restrictions Precautions Precautions: Fall (recent in her Independent apartment)      Mobility  Bed Mobility Overal bed mobility: Modified Independent                Transfers Overall transfer level: Needs assistance Equipment used: 4-wheeled walker Transfers: Sit to/from [facility] Sit to Stand: Min guard Stand pivot transfers: Min guard       General transfer comment: had to transfer back and forth to the bed side commade to clean up after unrination incontenience . needed min guard each time to steady and make her feel secure.   Ambulation/Gait Ambulation/Gait assistance: Min guard Ambulation Distance (Feet): 30 Feet Assistive device: Rolling walker (2 wheeled) Gait Pattern/deviations: Step-through pattern     General  Gait Details: small slow steps, pt stated she felt very weak, alomost as if she was going to pass out, but not really. Limited walking abilitya nd mobility due to weakness   Stairs            Wheelchair Mobility    Modified Rankin (Stroke Patients Only)       Balance                                             Pertinent Vitals/Pain Pain Assessment: No/denies pain    Home Living Family/patient expects to be discharged to::  (independent living , would have to hire help if needed it. )                 Additional Comments: from [place] , low commode , walk in shower     Prior Function Level of Independence: Independent with assistive device(s)         Comments: uses rollator (*4 w\heleed RW) to walk to [employer], however it is very fair away and getting a little harder to do.      Hand Dominance        Extremity/Trunk Assessment               Lower Extremity Assessment: Generalized weakness         Communication   Communication: No difficulties;HOH  Cognition Arousal/Alertness:  Awake/alert Behavior During Therapy: WFL for tasks assessed/performed Overall Cognitive Status: Within Functional Limits for tasks assessed                      General Comments      Exercises     Assessment/Plan    PT Assessment Patient needs continued PT services  PT Problem List Decreased strength;Decreased activity tolerance;Decreased mobility          PT Treatment Interventions Gait training;Functional mobility training;Therapeutic activities;Therapeutic exercise;Patient/family education    PT Goals (Current goals can be found in the Care Plan section)  Acute Rehab PT Goals Patient Stated Goal: Iw ant to be able to get up and get around in my apartmetn again, but I feel so weak at this time  PT Goal Formulation: With patient Time For Goal Achievement: 03/08/16 Potential to Achieve Goals: Good    Frequency  Min 3X/week   Barriers to discharge   limited help at independent living apartment . Walks to [employer], could use her WC until she got stronger if she needed, or she could have them bring her meals to her.     Co-evaluation               End of Session Equipment Utilized During Treatment: Gait belt Activity Tolerance: Patient tolerated treatment well;Patient limited by fatigue Patient left: in chair;with call bell/phone within reach;with chair alarm set Nurse Communication: Mobility status    Functional Assessment Tool Used: clinical judgement Functional Limitation: Mobility: Walking and moving around Mobility: Walking and Moving Around Current Status [id number]): At least 1 percent but less than 20 percent impaired, limited or restricted Mobility: Walking and Moving Around Goal Status [phone]): 0 percent impaired, limited or restricted    Time: 1015-1103 PT Time Calculation (min) (ACUTE ONLY): 48 min   Charges:   PT Evaluation $PT Eval Low Complexity: 1 Procedure PT Treatments $Gait Training: 8-22 mins $Therapeutic Exercise: 23-37 mins   PT G Codes:   PT G-Codes **NOT FOR INPATIENT CLASS** Functional Assessment Tool Used: clinical judgement Functional Limitation: Mobility: Walking and moving around Mobility: Walking and Moving Around Current Status [id number]): At least 1 percent but less than 20 percent impaired, limited or restricted Mobility: Walking and Moving Around Goal Status [phone]): 0 percent impaired, limited or restricted    [name] [name] 02/23/2016, 12:19 PM Clide Dales, PT Pager: [phone] 02/23/2016

## 2016-02-24 DIAGNOSIS — R55 Syncope and collapse: Secondary | ICD-10-CM | POA: Diagnosis not present

## 2016-02-24 LAB — GLUCOSE, CAPILLARY: Glucose-Capillary: 98 mg/dL (ref 65–99)

## 2016-02-24 LAB — CBC WITH DIFFERENTIAL/PLATELET
BASOS ABS: 0 10*3/uL (ref 0.0–0.1)
BASOS PCT: 0 %
EOS PCT: 5 %
Eosinophils Absolute: 0.4 10*3/uL (ref 0.0–0.7)
HEMATOCRIT: 33.5 % — AB (ref 36.0–46.0)
Hemoglobin: 11 g/dL — ABNORMAL LOW (ref 12.0–15.0)
Lymphocytes Relative: 16 %
Lymphs Abs: 1.1 10*3/uL (ref 0.7–4.0)
MCH: 27.6 pg (ref 26.0–34.0)
MCHC: 32.8 g/dL (ref 30.0–36.0)
MCV: 84 fL (ref 78.0–100.0)
MONO ABS: 0.6 10*3/uL (ref 0.1–1.0)
MONOS PCT: 8 %
Neutro Abs: 4.7 10*3/uL (ref 1.7–7.7)
Neutrophils Relative %: 71 %
PLATELETS: 164 10*3/uL (ref 150–400)
RBC: 3.99 MIL/uL (ref 3.87–5.11)
RDW: 14.3 % (ref 11.5–15.5)
WBC: 6.7 10*3/uL (ref 4.0–10.5)

## 2016-02-24 LAB — URINE CULTURE

## 2016-02-24 MED ORDER — CEPHALEXIN 500 MG PO CAPS: 500.0000 mg | ORAL_CAPSULE | Freq: Two times a day (BID) | ORAL | 0 refills | Status: DC

## 2016-02-24 MED ORDER — DIAZEPAM 5 MG PO TABS
5.0000 mg | ORAL_TABLET | Freq: Every evening | ORAL | Status: DC | PRN
  Administered 2016-02-24 – 2016-02-25 (×2): 5 mg via ORAL
  Filled 2016-02-24 (×2): qty 1

## 2016-02-24 NOTE — Progress Notes (Signed)
Received call from telemetry monitoring that patients heart rate dropped into high 30's-40's then up in 50's. Manually it was 59. Pt asleep, asymptomatic. Georges Mouse notified. Will continue to monitor patient.

## 2016-02-24 NOTE — Discharge Summary (Addendum)
Physician Discharge Summary  [name] [name] [id number] DOB: 1924-04-03 DOA: 02/22/2016  PCP: Garret Reddish, MD  Admit date: 02/22/2016 Discharge date: 02/24/2016  Time spent: 25 minutes  Recommendations for Outpatient Follow-up:  1. Complete 3 more days Keflex-Urine Cult still pending 2. Rpt cbc + bmet 1-2 weeks 3. HH RN/PT Ot ordered for patient as mild debility  Discharge Diagnoses:  Principal Problem:   Syncope Active Problems:   Essential hypertension   COPD with emphysema Gold B   Recurrent UTI   Constipation   Mild dementia   Discharge Condition: good  Diet recommendation: hh low salt  Filed Weights   02/23/16 0542 02/24/16 0527  Weight: 55.5 kg (122 lb 5.7 oz) 56.7 kg (125 lb)    History of present illness:   89 ? 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  She was felt to have needed Therapy on d/c back to independent living and this was ordered.  She stabilized during hospital stay without furhter incident and chronic issues were quiescent ECHO was performed with diastolic dysfunction ef 41-66% without overt vol overload and med changes were deferred until re-visit with PMD  Procedures:  Echo 11/09/14-[facility] diastolic hf, EF 32-35%  Consultations:  none  Discharge Exam: Vitals:   02/24/16 0128 02/24/16 0527  BP: (!) 118/56 (!) 91/54  Pulse: 61 (!) 56  Resp: 18 20  Temp: 98.6 F (37 C) 97.8 F (36.6 C)    General: alert very HOH, no ict pallor Cardiovascular: s1 s2no m/r/g Respiratory: clear no added sound  Discharge Instructions   Discharge Instructions     Diet - low sodium heart healthy    Complete by:  As directed    Discharge instructions    Complete by:  As directed    Complete course of keflex in 3 days See OP Md Therapy services to follow at [facility]   Increase activity slowly    Complete by:  As directed      Current Discharge Medication List    START taking these medications   Details  cephALEXin (KEFLEX) 500 MG capsule Take 1 capsule (500 mg total) by mouth every 12 (twelve) hours. Qty: 6 capsule, Refills: 0      CONTINUE these medications which have NOT CHANGED   Details  albuterol (PROVENTIL HFA;VENTOLIN HFA) 108 (90 Base) MCG/ACT inhaler Inhale 2 puffs into the lungs every 6 (six) hours as needed for wheezing or shortness of breath. Qty: 1 Inhaler, Refills: 0   Associated Diagnoses: Cough    amLODipine (NORVASC) 2.5 MG tablet TAKE 1 TABLET BY MOUTH EVERY DAY FOR HTN Qty: 90 tablet, Refills: 2    aspirin 325 MG tablet Take 325 mg by mouth at bedtime.     budesonide-formoterol (SYMBICORT) 160-4.5 MCG/ACT inhaler Inhale 2 puffs into the lungs 2 (two) times daily. Qty: 1 Inhaler, Refills: 12    diazepam (VALIUM) 5 MG tablet TAKE 1 TABLET BY MOUTH AT BEDTIME Qty: 30 tablet, Refills: 2    donepezil (ARICEPT) 10 MG tablet Take 1/2 tablet daily for 1 month, then increase to 1 tablet daily Qty: 30 tablet, Refills:  11   Associated Diagnoses: Mild dementia    fluticasone (FLONASE) 50 MCG/ACT nasal spray Place 2 sprays into both nostrils daily.    losartan (COZAAR) 50 MG tablet TAKE 1 TABLET BY MOUTH EVERY DAY Qty: 90 tablet, Refills: 2    nitroGLYCERIN (NITROSTAT) 0.4 MG SL tablet Place 1 tablet (0.4 mg total) under the tongue every 5 (five) minutes as needed for chest pain (take as directed). Qty: 25 tablet, Refills: 3       Allergies  Allergen Reactions  . Azithromycin Hives, Diarrhea, Dermatitis and Rash  . Ciprofloxacin Swelling  . Levaquin [Levofloxacin] Hives  . Penicillins Hives    has had mild  doses that she did not have reactions to  Has patient had a PCN reaction causing immediate rash, facial/tongue/throat swelling, SOB or lightheadedness with hypotension:Yes Has patient had a PCN reaction causing severe rash involving mucus membranes or skin necrosis: No Has patient had a PCN reaction that required hospitalization: no Has patient had a PCN reaction occurring within the last 10 years: no If all of the above answers are "NO", then may proceed with Cephalosporin use.   . Sulfamethoxazole Nausea And Vomiting      The results of significant diagnostics from this hospitalization (including imaging, microbiology, ancillary and laboratory) are listed below for reference.    Significant Diagnostic Studies: Dg Chest 2 View  Result Date: 02/23/2016 CLINICAL DATA:  Difficulty breathing.  Patient reports syncope. EXAM: CHEST  2 VIEW COMPARISON:  Chest radiograph 11/23/2015, chest CT 07/24/2013 FINDINGS: Lower lung volumes from prior exam. Stable cardiomediastinal contours with tortuosity and atherosclerosis of the thoracic aorta. Stable emphysema. Left lung suture with adjacent density, unchanged. No focal airspace disease, pleural effusion or pneumothorax. The bones are under mineralized. Chronic left rib deformity. There is postsurgical change in the right axilla. IMPRESSION: Stable cardiomegaly and postsurgical change in the left hemithorax. Stable bronchitic change. Electronically Signed   By: Jeb Levering M.D.   On: 02/23/2016 01:53   Mm Digital Diagnostic Unilat L  Result Date: 02/04/2016 CLINICAL DATA:  Evaluate biopsy marker EXAM: DIAGNOSTIC LEFT MAMMOGRAM POST ULTRASOUND BIOPSY COMPARISON:  Previous exam(s). FINDINGS: Mammographic images were obtained following ultrasound guided biopsy of a left breast mass. The ribbon shaped clip is in good position. IMPRESSION: Appropriate placement of biopsy clip. Final Assessment: Post Procedure Mammograms for Marker Placement Electronically  Signed   By: Dorise Bullion III M.D   On: 02/04/2016 12:03   US Breast Ltd Uni Left Inc Axilla  Result Date: 01/31/2016 CLINICAL DATA:  Patient presents for a bilateral diagnostic examination due to spontaneous brown left nipple discharge over the past couple months. History of previous malignant right lumpectomy 2009. EXAM: 2D DIGITAL DIAGNOSTIC bilateral MAMMOGRAM WITH CAD AND ADJUNCT TOMO ULTRASOUND left BREAST COMPARISON:  Previous exam(s). ACR Breast Density Category b: There are scattered areas of fibroglandular density. FINDINGS: Examination demonstrates stable post lumpectomy changes of the right breast. There is a possible mass over the upper outer left periareolar region. Remainder of the exam is unchanged. Mammographic images were processed with CAD. On physical exam, I am able to elicit light brown discharge from a single central left nipple duct. Targeted ultrasound is performed, showing an oval hypoechoic mass with somewhat indistinct borders just below the skin over the 2 o'clock position of the left retroareolar region measuring 4 x 7 x 8 mm. This may represent a small papilloma as malignancy is not excluded. Ultrasound of the left axilla is within normal. IMPRESSION: Indeterminate  mass over the 2 o'clock position of the left retroareolar region measuring 4 x 7 x 8 mm. RECOMMENDATION: Recommend ultrasound-guided core needle biopsy for further evaluation. I have discussed the findings and recommendations with the patient. Results were also provided in writing at the conclusion of the visit. If applicable, a reminder letter will be sent to the patient regarding the next appointment. BI-RADS CATEGORY  4: Suspicious. Patient will be scheduled for biopsy here at the [facility] prior to her departure. Electronically Signed   By: Marin Olp M.D.   On: 01/31/2016 16:25   Mm Diag Breast Tomo Bilateral  Result Date: 01/31/2016 CLINICAL DATA:  Patient presents for a bilateral diagnostic  examination due to spontaneous brown left nipple discharge over the past couple months. History of previous malignant right lumpectomy 2009. EXAM: 2D DIGITAL DIAGNOSTIC bilateral MAMMOGRAM WITH CAD AND ADJUNCT TOMO ULTRASOUND left BREAST COMPARISON:  Previous exam(s). ACR Breast Density Category b: There are scattered areas of fibroglandular density. FINDINGS: Examination demonstrates stable post lumpectomy changes of the right breast. There is a possible mass over the upper outer left periareolar region. Remainder of the exam is unchanged. Mammographic images were processed with CAD. On physical exam, I am able to elicit light brown discharge from a single central left nipple duct. Targeted ultrasound is performed, showing an oval hypoechoic mass with somewhat indistinct borders just below the skin over the 2 o'clock position of the left retroareolar region measuring 4 x 7 x 8 mm. This may represent a small papilloma as malignancy is not excluded. Ultrasound of the left axilla is within normal. IMPRESSION: Indeterminate mass over the 2 o'clock position of the left retroareolar region measuring 4 x 7 x 8 mm. RECOMMENDATION: Recommend ultrasound-guided core needle biopsy for further evaluation. I have discussed the findings and recommendations with the patient. Results were also provided in writing at the conclusion of the visit. If applicable, a reminder letter will be sent to the patient regarding the next appointment. BI-RADS CATEGORY  4: Suspicious. Patient will be scheduled for biopsy here at the [facility] prior to her departure. Electronically Signed   By: Marin Olp M.D.   On: 01/31/2016 16:25   [place] Lt Breast Bx W Loc Dev 1st Lesion Img Bx Spec US Guide  Addendum Date: 02/06/2016   ADDENDUM REPORT: 02/06/2016 08:13 ADDENDUM: Pathology revealed fibrocystic changes in the left breast. This was found to be concordant by Dr. Dorise Bullion. Pathology results were discussed with the patient's daughter,  Barton Fanny, by telephone. The patient did well after the biopsy. Post biopsy instructions and care were reviewed and questions were answered. The patient was encouraged to call [facility] for any additional concerns. Bilateral MRI would be recommended for evaluation of left nipple discharge. This will be discussed with Dr. Alphonsa Overall at a follow-up appointment. Pathology results reported by Susa Raring RN, BSN on 02/06/2016. Electronically Signed   By: Dorise Bullion III M.D   On: 02/06/2016 08:13   Result Date: 02/06/2016 CLINICAL DATA:  [place] nipple discharge on the left. Left breast mass. EXAM: ULTRASOUND GUIDED LEFT BREAST CORE NEEDLE BIOPSY COMPARISON:  Previous exam(s). FINDINGS: I met with the patient and we discussed the procedure of ultrasound-guided biopsy, including benefits and alternatives. We discussed the high likelihood of a successful procedure. We discussed the risks of the procedure, including infection, bleeding, tissue injury, clip migration, and inadequate sampling. Informed written consent was given. The usual time-out protocol was performed immediately  prior to the procedure. Using sterile technique and 1% Lidocaine as local anesthetic, under direct ultrasound visualization, a 12 gauge spring-loaded device was used to perform biopsy of a left breast mass using a lateral approach. At the conclusion of the procedure a tissue marker clip was deployed into the biopsy cavity. Follow up 2 view mammogram was performed and dictated separately. IMPRESSION: Ultrasound guided biopsy of a left breast mass. No apparent complications. Electronically Signed: By: Dorise Bullion III M.D On: 02/04/2016 11:36    Microbiology: Recent Results (from the past 240 hour(s))  MRSA PCR Screening     Status: None   Collection Time: 02/23/16  6:21 AM  Result Value Ref Range Status   MRSA by PCR NEGATIVE NEGATIVE Final    Comment:        The GeneXpert MRSA Assay  (FDA approved for NASAL specimens only), is one component of a comprehensive MRSA colonization surveillance program. It is not intended to diagnose MRSA infection nor to guide or monitor treatment for MRSA infections.      Labs: Basic Metabolic Panel:  Recent Labs Lab 02/22/16 2309  NA 138  K 3.9  CL 104  CO2 28  GLUCOSE 124*  BUN 16  CREATININE 0.89  CALCIUM 8.9   Liver Function Tests: No results for input(s): AST, ALT, ALKPHOS, BILITOT, PROT, ALBUMIN in the last 168 hours. No results for input(s): LIPASE, AMYLASE in the last 168 hours. No results for input(s): AMMONIA in the last 168 hours. CBC:  Recent Labs Lab 02/22/16 2309 02/24/16 0455  WBC 11.5* 6.7  NEUTROABS  --  4.7  HGB 13.1 11.0*  HCT 39.9 33.5*  MCV 87.3 84.0  PLT 191 164   Cardiac Enzymes: No results for input(s): CKTOTAL, CKMB, CKMBINDEX, TROPONINI in the last 168 hours. BNP: BNP (last 3 results) No results for input(s): BNP in the last 8760 hours.  ProBNP (last 3 results) No results for input(s): PROBNP in the last 8760 hours.  CBG:  Recent Labs Lab 02/22/16 2302 02/23/16 0735 02/24/16 0523  GLUCAP 84 134* 98       Signed:  Nita Sells MD   [facility] 02/24/2016, 11:11 AM

## 2016-02-24 NOTE — Progress Notes (Signed)
Patient ambulated in hallway around 10 feet. Patient quickly became nauseated and c/o severe dizziness and lightheaded. Patient appeared to be very weak and required assistance to get back to chair in her room. Patient stated she felt much better after sitting down. MD made aware.

## 2016-02-25 DIAGNOSIS — I5032 Chronic diastolic (congestive) heart failure: Secondary | ICD-10-CM | POA: Diagnosis not present

## 2016-02-25 DIAGNOSIS — Z87891 Personal history of nicotine dependence: Secondary | ICD-10-CM | POA: Diagnosis not present

## 2016-02-25 DIAGNOSIS — Z79899 Other long term (current) drug therapy: Secondary | ICD-10-CM | POA: Diagnosis not present

## 2016-02-25 DIAGNOSIS — R5381 Other malaise: Secondary | ICD-10-CM | POA: Diagnosis present

## 2016-02-25 DIAGNOSIS — Z853 Personal history of malignant neoplasm of breast: Secondary | ICD-10-CM | POA: Diagnosis not present

## 2016-02-25 DIAGNOSIS — K5909 Other constipation: Secondary | ICD-10-CM | POA: Diagnosis not present

## 2016-02-25 DIAGNOSIS — Z87440 Personal history of urinary (tract) infections: Secondary | ICD-10-CM | POA: Diagnosis not present

## 2016-02-25 DIAGNOSIS — I2510 Atherosclerotic heart disease of native coronary artery without angina pectoris: Secondary | ICD-10-CM | POA: Diagnosis present

## 2016-02-25 DIAGNOSIS — Z85820 Personal history of malignant melanoma of skin: Secondary | ICD-10-CM | POA: Diagnosis not present

## 2016-02-25 DIAGNOSIS — Z8673 Personal history of transient ischemic attack (TIA), and cerebral infarction without residual deficits: Secondary | ICD-10-CM | POA: Diagnosis not present

## 2016-02-25 DIAGNOSIS — F0390 Unspecified dementia without behavioral disturbance: Secondary | ICD-10-CM | POA: Diagnosis present

## 2016-02-25 DIAGNOSIS — G4733 Obstructive sleep apnea (adult) (pediatric): Secondary | ICD-10-CM | POA: Diagnosis present

## 2016-02-25 DIAGNOSIS — Z7982 Long term (current) use of aspirin: Secondary | ICD-10-CM | POA: Diagnosis not present

## 2016-02-25 DIAGNOSIS — J449 Chronic obstructive pulmonary disease, unspecified: Secondary | ICD-10-CM | POA: Diagnosis present

## 2016-02-25 DIAGNOSIS — N390 Urinary tract infection, site not specified: Secondary | ICD-10-CM | POA: Diagnosis not present

## 2016-02-25 DIAGNOSIS — R55 Syncope and collapse: Secondary | ICD-10-CM | POA: Diagnosis not present

## 2016-02-25 DIAGNOSIS — I110 Hypertensive heart disease with heart failure: Secondary | ICD-10-CM | POA: Diagnosis present

## 2016-02-25 DIAGNOSIS — I480 Paroxysmal atrial fibrillation: Secondary | ICD-10-CM | POA: Diagnosis not present

## 2016-02-25 DIAGNOSIS — Z7951 Long term (current) use of inhaled steroids: Secondary | ICD-10-CM | POA: Diagnosis not present

## 2016-02-25 LAB — TROPONIN I
Troponin I: <0.03 ng/mL (ref <0.03)
Troponin I: <0.03 ng/mL (ref <0.03)

## 2016-02-25 LAB — GLUCOSE, CAPILLARY: Glucose-Capillary: 88 mg/dL (ref 65–99)

## 2016-02-25 MED ORDER — MIDODRINE HCL 5 MG PO TABS
5.0000 mg | ORAL_TABLET | Freq: Three times a day (TID) | ORAL | Status: DC
  Administered 2016-02-25 – 2016-02-26 (×4): 5 mg via ORAL
  Filled 2016-02-25 (×5): qty 1

## 2016-02-25 NOTE — Care Management Note (Signed)
Case Management Note  Patient Details  Name: [name] [name] MRN: [id number] Date of Birth: [birth year]  Subjective/Objective:82 y/o f admitted w/Syncope. Chest pain today-ekg, ce. From home alone. Provided w/HHC agency list for choice-patient chose [facility], also provided w/private sitter list(independent decision, out of pocket pay) Patient voiced understanding. [facility] chosen for HHC-rep aware-await HHPT order.                   Action/Plan:d/c plan home w/HHC.   Expected Discharge Date:                  Expected Discharge Plan:  [place]  In-House Referral:     Discharge planning Services  CM Consult  Post Acute Care Choice:    Choice offered to:  Patient  DME Arranged:    DME Agency:     HH Arranged:    [place] Agency:  [facility]  Status of Service:  In process, will continue to follow  If discussed at Long Length of Stay Meetings, dates discussed:    Additional Comments:  Dessa Phi, RN 02/25/2016, 12:03 PM

## 2016-02-25 NOTE — Care Management Obs Status (Signed)
[facility] NOTIFICATION   Patient Details  Name: Sanaai Sabado MRN: [id number] Date of Birth: 01/01/1924   Medicare Observation Status Notification Given:  Yes    MahabirJuliann Pulse, RN 02/25/2016, 11:58 AM

## 2016-02-25 NOTE — Progress Notes (Signed)
Physical Therapy Treatment Patient Details Name: [name] [name] MRN: [id number] DOB: 08-04-1923 Today's Date: 02/25/2016    History of Present Illness [name] [name] is a 82 y.o. female with a past medical history significant for HTN, COPD, mild dementia, history of CVA, history of CAD, mitral valve prolapse, OSA CPAP intolerant who presents with syncope. Pt also reports that urine incontenence is brand new     PT Comments    Pt assisted with ambulating short distance and continues to report mostly weakness however also vague dizziness.  Pt reports chest pain post ambulation upon PT leaving room and RN immediately notified.   Supine BP: 134/51 mmHg 62 bpm, SpO2 97% room air Sitting BP: 131/68 mmHg 65 bpm After ambulating, Sitting: 139/65 mmHg, 65 bpm, SpO2 97% room air Vestibular evaluation not performed at this time: pt poor historian in regards to providing description to symptoms, no nystagmus observed with mobility, pt not reporting spinning at this time.   Follow Up Recommendations  Supervision/Assistance - 24 hour;SNF (may need increased care if ILF unable to provide assist)     Equipment Recommendations  None recommended by PT    Recommendations for Other Services       Precautions / Restrictions Precautions Precautions: Fall    Mobility  Bed Mobility Overal bed mobility: Modified Independent                Transfers Overall transfer level: Needs assistance Equipment used: 4-wheeled walker Transfers: Sit to/from Stand Sit to Stand: Min guard         General transfer comment: min/guard for safety, pt reports feeling weakness  Ambulation/Gait Ambulation/Gait assistance: Min guard;+2 safety/equipment Ambulation Distance (Feet): 20 Feet Assistive device: Rolling walker (2 wheeled) Gait Pattern/deviations: Step-through pattern     General Gait Details: recliner following for safety, pt reports feeling weakness, also reports dizziness but states mostly weakness,  requested sitting after 20 feet   Stairs            Wheelchair Mobility    Modified Rankin (Stroke Patients Only)       Balance                                    Cognition Arousal/Alertness: Awake/alert Behavior During Therapy: WFL for tasks assessed/performed Overall Cognitive Status: Within Functional Limits for tasks assessed                      Exercises      General Comments        Pertinent Vitals/Pain Pain Assessment: 0-10 Pain Score:  (not rated) Pain Location: reported chest pain after session, RN notified    Home Living                      Prior Function            PT Goals (current goals can now be found in the care plan section) Progress towards PT goals: Progressing toward goals    Frequency    Min 3X/week      PT Plan Current plan remains appropriate    Co-evaluation             End of Session Equipment Utilized During Treatment: Gait belt Activity Tolerance: Patient limited by fatigue Patient left: in chair;with call bell/phone within reach;with nursing/sitter in room;with chair alarm set     Time: [id number] PT Time Calculation (  min) (ACUTE ONLY): 17 min  Charges:  $Gait Training: 8-22 mins                    G Codes:      Delvon Chipps,KATHrine E 03/21/16, 1:09 PM Carmelia Bake, PT, DPT 2016-03-21 Pager: [phone]

## 2016-02-25 NOTE — Progress Notes (Signed)
[name] [name] [id number] DOB: 1923-11-04 DOA: 02/22/2016 PCP: Garret Reddish, MD  Brief narrative: 31 ? htn CP ruled out by troponins 07/2014 Prior Br Cancer s/p lumpectomy 2009 Mild COPD Dementia Prior CVA 07/2013  MVP OSA on Cpap Prior acute hepatitis in the past 11/2013 thought to be 2/2 to GE Prior Afib which is paroxysmal and predominant NSR Admitted 02/23/16 with probable urinary infectiona nd syncope  Has hadorthostasis and cp today and d/c held  Past medical history-As per Problem list Chart reviewed as below-   Consultants:    Procedures:    Antibiotics:     Subjective  Walked with CP this am Felt also a little dizzy as she did yesterday I spoke with therapy who didn;t have concerns for BPPV   Objective    Interim History:   Telemetry: nsr   Objective: Vitals:   02/25/16 0206 02/25/16 0518 02/25/16 0838 02/25/16 1025  BP: 131/61 138/74  124/60  Pulse: (!) 51 62 65 67  Resp: 18 18 16    Temp: 98.4 F (36.9 C) 98.1 F (36.7 C)    TempSrc: Oral Oral    SpO2: 98% 93% 94% 99%  Weight:  56 kg (123 lb 7.3 oz)    Height:        Intake/Output Summary (Last 24 hours) at 02/25/16 1037 Last data filed at 02/25/16 0600  Gross per 24 hour  Intake              420 ml  Output                0 ml  Net              420 ml    Exam:  General: eomi ncat very hoh Cardiovascular: s1 s2 no m/r/g Respiratory: clear no added sound.  Pressure on friont of chest reproduces simlar pains to when ambulating Abdomen: soft nt nd no reboudn Skin no le edema  Neurointact  Data Reviewed: Basic Metabolic Panel:  Recent Labs Lab 02/22/16 2309  NA 138  K 3.9  CL 104  CO2 28  GLUCOSE 124*  BUN 16  CREATININE 0.89  CALCIUM 8.9   Liver Function Tests: No results for input(s): AST, ALT, ALKPHOS, BILITOT, PROT, ALBUMIN in the last 168 hours. No results for input(s): LIPASE, AMYLASE in the last 168 hours. No results for input(s): AMMONIA in the last 168  hours. CBC:  Recent Labs Lab 02/22/16 2309 02/24/16 0455  WBC 11.5* 6.7  NEUTROABS  --  4.7  HGB 13.1 11.0*  HCT 39.9 33.5*  MCV 87.3 84.0  PLT 191 164   Cardiac Enzymes: No results for input(s): CKTOTAL, CKMB, CKMBINDEX, TROPONINI in the last 168 hours. BNP: Invalid input(s): POCBNP CBG:  Recent Labs Lab 02/22/16 2302 02/23/16 0735 02/24/16 0523 02/25/16 0525  GLUCAP 84 134* 98 88    Recent Results (from the past 240 hour(s))  Urine culture     Status: Abnormal   Collection Time: 02/22/16 11:27 PM  Result Value Ref Range Status   Specimen Description URINE, CLEAN CATCH  Final   Special Requests NONE  Final   Culture MULTIPLE SPECIES PRESENT, SUGGEST RECOLLECTION (A)  Final   Report Status 02/24/2016 FINAL  Final  MRSA PCR Screening     Status: None   Collection Time: 02/23/16  6:21 AM  Result Value Ref Range Status   MRSA by PCR NEGATIVE NEGATIVE Final    Comment:        The  GeneXpert MRSA Assay (FDA approved for NASAL specimens only), is one component of a comprehensive MRSA colonization surveillance program. It is not intended to diagnose MRSA infection nor to guide or monitor treatment for MRSA infections.      Studies:              All Imaging reviewed and is as per above notation   Scheduled Meds: . aspirin  325 mg Oral QHS  . cephALEXin  500 mg Oral Q12H  . donepezil  10 mg Oral QHS  . enoxaparin (LOVENOX) injection  40 mg Subcutaneous Q24H  . mouth rinse  15 mL Mouth Rinse BID  . mometasone-formoterol  2 puff Inhalation BID  . senna  1 tablet Oral BID  . sodium chloride flush  3 mL Intravenous Q12H   Continuous Infusions:    Assessment/Plan:  1. Syncope-BPPV vs Orthostasis-not on BP meds.  Monitor again today. Trial TED and maybe midodrine 2. UTI-UC not revealing-d/c Abx today 3. CP-get EKG and r/o with troponin 4. Prior Breast Ca s/p lumpectomy 5. CVA 2015-stable currnetly no deficit 6. Paroxsymal Afib-monitor-only on asa 325  presumed 22/ to falls 7. MVP-unclear if this is causing symtpoms-no impressive murmu on asuc. echonot confriming any sevwere regrug or stenosis    Will need at least 24 hrs more  Verneita Griffes, MD  [facility] Pager [phone] 02/25/2016, 10:37 AM    LOS: 0 days

## 2016-02-25 NOTE — Care Management Note (Signed)
Case Management Note  Patient Details  Name: [name] [name] MRN: [id number] Date of Birth: [birth year]  Subjective/Objective:  [facility] rep aware of HHPT/OT/aide f27f order. No further CM needs.                  Action/Plan:d/c home w/HHC.   Expected Discharge Date:                  Expected Discharge Plan:  [place]  In-House Referral:     Discharge planning Services  CM Consult  Post Acute Care Choice:    Choice offered to:  Patient  DME Arranged:    DME Agency:     HH Arranged:  PT, OT, Nurse's Aide [place] Agency:  [place] [facility]  Status of Service:  Completed, signed off  If discussed at [place] of Stay Meetings, dates discussed:    Additional Comments:  Dessa Phi, RN 02/25/2016, 12:07 PM

## 2016-02-26 LAB — GLUCOSE, CAPILLARY: GLUCOSE-CAPILLARY: 99 mg/dL (ref 65–99)

## 2016-02-26 NOTE — Discharge Summary (Signed)
Physician Discharge Summary  [name] [name] MGQ:[id number] DOB: [birth year] DOA: 02/22/2016  PCP: Garret Reddish, MD  Admit date: 02/22/2016 Discharge date: 02/26/2016  Time spent: 25 minutes  Recommendations for Outpatient Follow-up:   1. Rpt cbc + bmet 1-2 weeks 2. HH RN/PT Ot ordered for patient as mild debility 3. Revisit use on amlodipine and losartan-had orthostasis and likely has POTS syndrome so probably should only be on PRN BP meds  Discharge Diagnoses:  Likely orthostatic syncope was main diagnosis Principal Problem:   Syncope Active Problems:   Essential hypertension   COPD with emphysema Gold B   Recurrent UTI   Constipation   Mild dementia   Discharge Condition: good  Diet recommendation: hh low salt  Filed Weights   02/24/16 0527 02/25/16 0518 02/26/16 0517  Weight: 56.7 kg (125 lb) 56 kg (123 lb 7.3 oz) 54.8 kg (120 lb 13 oz)    History of present illness:   89 ? 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  She was felt to have needed Therapy on d/c back to independent living and this was ordered.  She stabilized during hospital stay although had cp and dizzyness on ambulation She did not meet IP criterion and wa sobserved with neg EKG and troponin chronic issues were quiescent-her HTn medications however have been held as she was orthostatic on admit ECHO was performed with diastolic dysfunction ef 26-71% without overt vol overload and med changes were deferred until re-visit with PMD  Procedures:  Echo 24/58/09-XIPJ diastolic hf, EF  82-50%  Consultations:  none  Discharge Exam: Vitals:   02/25/16 2154 02/26/16 0517  BP: (!) 147/73 (!) 176/80  Pulse: (!) 59 69  Resp: 16 16  Temp: 98 F (36.7 C) 97.5 F (36.4 C)    General: alert very HOH, no ict pallor Cardiovascular: s1 s2no m/r/g Respiratory: clear no added sound  Discharge Instructions   Discharge Instructions    Diet - low sodium heart healthy    Complete by:  As directed    Discharge instructions    Complete by:  As directed    Complete course of keflex in 3 days See OP Md Therapy services to follow at [facility]   Increase activity slowly    Complete by:  As directed      Current Discharge Medication List    START taking these medications   Details  cephALEXin (KEFLEX) 500 MG capsule Take 1 capsule (500 mg total) by mouth every 12 (twelve) hours. Qty: 6 capsule, Refills: 0      CONTINUE these medications which have NOT CHANGED   Details  albuterol (PROVENTIL HFA;VENTOLIN HFA) 108 (90 Base) MCG/ACT inhaler Inhale 2 puffs into the lungs every 6 (six) hours as needed for wheezing or shortness of breath. Qty: 1 Inhaler, Refills: 0   Associated Diagnoses: Cough    amLODipine (NORVASC) 2.5 MG tablet TAKE 1 TABLET BY MOUTH EVERY DAY FOR HTN Qty: 90 tablet, Refills: 2    aspirin 325 MG tablet Take 325 mg by mouth at bedtime.     budesonide-formoterol (SYMBICORT) 160-4.5 MCG/ACT inhaler Inhale 2 puffs  into the lungs 2 (two) times daily. Qty: 1 Inhaler, Refills: 12    diazepam (VALIUM) 5 MG tablet TAKE 1 TABLET BY MOUTH AT BEDTIME Qty: 30 tablet, Refills: 2    donepezil (ARICEPT) 10 MG tablet Take 1/2 tablet daily for 1 month, then increase to 1 tablet daily Qty: 30 tablet, Refills: 11   Associated Diagnoses: Mild dementia    fluticasone (FLONASE) 50 MCG/ACT nasal spray Place 2 sprays into both nostrils daily.    losartan (COZAAR) 50 MG tablet TAKE 1 TABLET BY MOUTH EVERY DAY Qty: 90 tablet, Refills: 2    nitroGLYCERIN  (NITROSTAT) 0.4 MG SL tablet Place 1 tablet (0.4 mg total) under the tongue every 5 (five) minutes as needed for chest pain (take as directed). Qty: 25 tablet, Refills: 3       Allergies  Allergen Reactions  . Azithromycin Hives, Diarrhea, Dermatitis and Rash  . Ciprofloxacin Swelling  . Levaquin [Levofloxacin] Hives  . Penicillins Hives    has had mild doses that she did not have reactions to  Has patient had a PCN reaction causing immediate rash, facial/tongue/throat swelling, SOB or lightheadedness with hypotension:Yes Has patient had a PCN reaction causing severe rash involving mucus membranes or skin necrosis: No Has patient had a PCN reaction that required hospitalization: no Has patient had a PCN reaction occurring within the last 10 years: no If all of the above answers are "NO", then may proceed with Cephalosporin use.   . Sulfamethoxazole Nausea And Vomiting   Follow-up Information    [facility] .   Why:  [facility], occupational therapy, aide Contact information: [address] [place] [address] [phone]            The results of significant diagnostics from this hospitalization (including imaging, microbiology, ancillary and laboratory) are listed below for reference.    Significant Diagnostic Studies: Dg Chest 2 View  Result Date: 02/23/2016 CLINICAL DATA:  Difficulty breathing.  Patient reports syncope. EXAM: CHEST  2 VIEW COMPARISON:  Chest radiograph 11/23/2015, chest CT 07/24/2013 FINDINGS: Lower lung volumes from prior exam. Stable cardiomediastinal contours with tortuosity and atherosclerosis of the thoracic aorta. Stable emphysema. Left lung suture with adjacent density, unchanged. No focal airspace disease, pleural effusion or pneumothorax. The bones are under mineralized. Chronic left rib deformity. There is postsurgical change in the right axilla. IMPRESSION: Stable cardiomegaly and postsurgical change in the  left hemithorax. Stable bronchitic change. Electronically Signed   By: Jeb Levering M.D.   On: 02/23/2016 01:53   Mm Digital Diagnostic Unilat L  Result Date: 02/04/2016 CLINICAL DATA:  Evaluate biopsy marker EXAM: DIAGNOSTIC LEFT MAMMOGRAM POST ULTRASOUND BIOPSY COMPARISON:  Previous exam(s). FINDINGS: Mammographic images were obtained following ultrasound guided biopsy of a left breast mass. The ribbon shaped clip is in good position. IMPRESSION: Appropriate placement of biopsy clip. Final Assessment: Post Procedure Mammograms for Marker Placement Electronically Signed   By: Dorise Bullion III M.D   On: 02/04/2016 12:03   US Breast Ltd Uni Left Inc Axilla  Result Date: 01/31/2016 CLINICAL DATA:  Patient presents for a bilateral diagnostic examination due to spontaneous brown left nipple discharge over the past couple months. History of previous malignant right lumpectomy 2009. EXAM: 2D DIGITAL DIAGNOSTIC bilateral MAMMOGRAM WITH CAD AND ADJUNCT TOMO ULTRASOUND left BREAST COMPARISON:  Previous exam(s). ACR Breast Density Category b: There are scattered areas of fibroglandular density. FINDINGS: Examination demonstrates stable post lumpectomy changes of the right breast. There is  a possible mass over the upper outer left periareolar region. Remainder of the exam is unchanged. Mammographic images were processed with CAD. On physical exam, I am able to elicit light brown discharge from a single central left nipple duct. Targeted ultrasound is performed, showing an oval hypoechoic mass with somewhat indistinct borders just below the skin over the 2 o'clock position of the left retroareolar region measuring 4 x 7 x 8 mm. This may represent a small papilloma as malignancy is not excluded. Ultrasound of the left axilla is within normal. IMPRESSION: Indeterminate mass over the 2 o'clock position of the left retroareolar region measuring 4 x 7 x 8 mm. RECOMMENDATION: Recommend ultrasound-guided core needle  biopsy for further evaluation. I have discussed the findings and recommendations with the patient. Results were also provided in writing at the conclusion of the visit. If applicable, a reminder letter will be sent to the patient regarding the next appointment. BI-RADS CATEGORY  4: Suspicious. Patient will be scheduled for biopsy here at the [facility] prior to her departure. Electronically Signed   By: Marin Olp M.D.   On: 01/31/2016 16:25   Mm Diag Breast Tomo Bilateral  Result Date: 01/31/2016 CLINICAL DATA:  Patient presents for a bilateral diagnostic examination due to spontaneous brown left nipple discharge over the past couple months. History of previous malignant right lumpectomy 2009. EXAM: 2D DIGITAL DIAGNOSTIC bilateral MAMMOGRAM WITH CAD AND ADJUNCT TOMO ULTRASOUND left BREAST COMPARISON:  Previous exam(s). ACR Breast Density Category b: There are scattered areas of fibroglandular density. FINDINGS: Examination demonstrates stable post lumpectomy changes of the right breast. There is a possible mass over the upper outer left periareolar region. Remainder of the exam is unchanged. Mammographic images were processed with CAD. On physical exam, I am able to elicit light brown discharge from a single central left nipple duct. Targeted ultrasound is performed, showing an oval hypoechoic mass with somewhat indistinct borders just below the skin over the 2 o'clock position of the left retroareolar region measuring 4 x 7 x 8 mm. This may represent a small papilloma as malignancy is not excluded. Ultrasound of the left axilla is within normal. IMPRESSION: Indeterminate mass over the 2 o'clock position of the left retroareolar region measuring 4 x 7 x 8 mm. RECOMMENDATION: Recommend ultrasound-guided core needle biopsy for further evaluation. I have discussed the findings and recommendations with the patient. Results were also provided in writing at the conclusion of the visit. If applicable, a reminder  letter will be sent to the patient regarding the next appointment. BI-RADS CATEGORY  4: Suspicious. Patient will be scheduled for biopsy here at the [facility] prior to her departure. Electronically Signed   By: Marin Olp M.D.   On: 01/31/2016 16:25   [place] Lt Breast Bx W Loc Dev 1st Lesion Img Bx Spec US Guide  Addendum Date: 02/06/2016   ADDENDUM REPORT: 02/06/2016 08:13 ADDENDUM: Pathology revealed fibrocystic changes in the left breast. This was found to be concordant by Dr. Dorise Bullion. Pathology results were discussed with the patient's daughter, Barton Fanny, by telephone. The patient did well after the biopsy. Post biopsy instructions and care were reviewed and questions were answered. The patient was encouraged to call [facility] for any additional concerns. Bilateral MRI would be recommended for evaluation of left nipple discharge. This will be discussed with Dr. Alphonsa Overall at a follow-up appointment. Pathology results reported by Susa Raring RN, BSN on 02/06/2016. Electronically Signed   By:  Dorise Bullion III M.D   On: 02/06/2016 08:13   Result Date: 02/06/2016 CLINICAL DATA:  [place] nipple discharge on the left. Left breast mass. EXAM: ULTRASOUND GUIDED LEFT BREAST CORE NEEDLE BIOPSY COMPARISON:  Previous exam(s). FINDINGS: I met with the patient and we discussed the procedure of ultrasound-guided biopsy, including benefits and alternatives. We discussed the high likelihood of a successful procedure. We discussed the risks of the procedure, including infection, bleeding, tissue injury, clip migration, and inadequate sampling. Informed written consent was given. The usual time-out protocol was performed immediately prior to the procedure. Using sterile technique and 1% Lidocaine as local anesthetic, under direct ultrasound visualization, a 12 gauge spring-loaded device was used to perform biopsy of a left breast mass using a lateral approach. At the conclusion  of the procedure a tissue marker clip was deployed into the biopsy cavity. Follow up 2 view mammogram was performed and dictated separately. IMPRESSION: Ultrasound guided biopsy of a left breast mass. No apparent complications. Electronically Signed: By: Dorise Bullion III M.D On: 02/04/2016 11:36    Microbiology: Recent Results (from the past 240 hour(s))  Urine culture     Status: Abnormal   Collection Time: 02/22/16 11:27 PM  Result Value Ref Range Status   Specimen Description URINE, CLEAN CATCH  Final   Special Requests NONE  Final   Culture MULTIPLE SPECIES PRESENT, SUGGEST RECOLLECTION (A)  Final   Report Status 02/24/2016 FINAL  Final  MRSA PCR Screening     Status: None   Collection Time: 02/23/16  6:21 AM  Result Value Ref Range Status   MRSA by PCR NEGATIVE NEGATIVE Final    Comment:        The GeneXpert MRSA Assay (FDA approved for NASAL specimens only), is one component of a comprehensive MRSA colonization surveillance program. It is not intended to diagnose MRSA infection nor to guide or monitor treatment for MRSA infections.      Labs: Basic Metabolic Panel:  Recent Labs Lab 02/22/16 2309  NA 138  K 3.9  CL 104  CO2 28  GLUCOSE 124*  BUN 16  CREATININE 0.89  CALCIUM 8.9   Liver Function Tests: No results for input(s): AST, ALT, ALKPHOS, BILITOT, PROT, ALBUMIN in the last 168 hours. No results for input(s): LIPASE, AMYLASE in the last 168 hours. No results for input(s): AMMONIA in the last 168 hours. CBC:  Recent Labs Lab 02/22/16 2309 02/24/16 0455  WBC 11.5* 6.7  NEUTROABS  --  4.7  HGB 13.1 11.0*  HCT 39.9 33.5*  MCV 87.3 84.0  PLT 191 164   Cardiac Enzymes:  Recent Labs Lab 02/25/16 1050 02/25/16 1617 02/25/16 2214  TROPONINI <0.03 <0.03 <0.03   BNP: BNP (last 3 results) No results for input(s): BNP in the last 8760 hours.  ProBNP (last 3 results) No results for input(s): PROBNP in the last 8760 hours.  CBG:  Recent  Labs Lab 02/22/16 2302 02/23/16 0735 02/24/16 0523 02/25/16 0525 02/26/16 0523  GLUCAP 84 134* 98 88 99       Signed:  Nita Sells MD   [facility] 02/26/2016, 10:20 AM

## 2016-02-26 NOTE — Progress Notes (Signed)
D/C instructions reviewed w/ pt, neighbor, Maudie Mercury, as well as pt's daughter Cathie via speaker phone. All three verbalize understanding and all questions answered. Pt d/c in w/c in stable condition to neighbor's car. Kim in possession of d/c instructions and all personal belongings. Aware of need to p/u script at pharmacy on way home.

## 2016-02-27 ENCOUNTER — Telehealth: Payer: Self-pay

## 2016-02-27 ENCOUNTER — Telehealth: Payer: Self-pay | Admitting: Family Medicine

## 2016-02-27 DIAGNOSIS — K589 Irritable bowel syndrome without diarrhea: Secondary | ICD-10-CM | POA: Diagnosis not present

## 2016-02-27 DIAGNOSIS — G4733 Obstructive sleep apnea (adult) (pediatric): Secondary | ICD-10-CM | POA: Diagnosis not present

## 2016-02-27 DIAGNOSIS — Z9011 Acquired absence of right breast and nipple: Secondary | ICD-10-CM | POA: Diagnosis not present

## 2016-02-27 DIAGNOSIS — I4891 Unspecified atrial fibrillation: Secondary | ICD-10-CM | POA: Diagnosis not present

## 2016-02-27 DIAGNOSIS — Z9981 Dependence on supplemental oxygen: Secondary | ICD-10-CM | POA: Diagnosis not present

## 2016-02-27 DIAGNOSIS — Z8673 Personal history of transient ischemic attack (TIA), and cerebral infarction without residual deficits: Secondary | ICD-10-CM | POA: Diagnosis not present

## 2016-02-27 DIAGNOSIS — J8410 Pulmonary fibrosis, unspecified: Secondary | ICD-10-CM | POA: Diagnosis not present

## 2016-02-27 DIAGNOSIS — Z87891 Personal history of nicotine dependence: Secondary | ICD-10-CM | POA: Diagnosis not present

## 2016-02-27 DIAGNOSIS — F0390 Unspecified dementia without behavioral disturbance: Secondary | ICD-10-CM | POA: Diagnosis not present

## 2016-02-27 DIAGNOSIS — Z7982 Long term (current) use of aspirin: Secondary | ICD-10-CM | POA: Diagnosis not present

## 2016-02-27 DIAGNOSIS — K219 Gastro-esophageal reflux disease without esophagitis: Secondary | ICD-10-CM | POA: Diagnosis not present

## 2016-02-27 DIAGNOSIS — I10 Essential (primary) hypertension: Secondary | ICD-10-CM | POA: Diagnosis not present

## 2016-02-27 DIAGNOSIS — I2510 Atherosclerotic heart disease of native coronary artery without angina pectoris: Secondary | ICD-10-CM | POA: Diagnosis not present

## 2016-02-27 DIAGNOSIS — K760 Fatty (change of) liver, not elsewhere classified: Secondary | ICD-10-CM | POA: Diagnosis not present

## 2016-02-27 DIAGNOSIS — Z7951 Long term (current) use of inhaled steroids: Secondary | ICD-10-CM | POA: Diagnosis not present

## 2016-02-27 DIAGNOSIS — F411 Generalized anxiety disorder: Secondary | ICD-10-CM | POA: Diagnosis not present

## 2016-02-27 DIAGNOSIS — M810 Age-related osteoporosis without current pathological fracture: Secondary | ICD-10-CM | POA: Diagnosis not present

## 2016-02-27 DIAGNOSIS — R55 Syncope and collapse: Secondary | ICD-10-CM | POA: Diagnosis not present

## 2016-02-27 DIAGNOSIS — Z85820 Personal history of malignant melanoma of skin: Secondary | ICD-10-CM | POA: Diagnosis not present

## 2016-02-27 DIAGNOSIS — N390 Urinary tract infection, site not specified: Secondary | ICD-10-CM | POA: Diagnosis not present

## 2016-02-27 DIAGNOSIS — J439 Emphysema, unspecified: Secondary | ICD-10-CM | POA: Diagnosis not present

## 2016-02-27 DIAGNOSIS — K5730 Diverticulosis of large intestine without perforation or abscess without bleeding: Secondary | ICD-10-CM | POA: Diagnosis not present

## 2016-02-27 DIAGNOSIS — Z853 Personal history of malignant neoplasm of breast: Secondary | ICD-10-CM | POA: Diagnosis not present

## 2016-02-27 NOTE — Telephone Encounter (Signed)
[name] [name] needs verbal order to continue PT for twice a wk for 5 wks. Pt was hospitalized for UTI . Pt is out

## 2016-02-27 NOTE — Telephone Encounter (Deleted)
Transition Care Management Follow-up Telephone Call  How have you been since you were released from the hospital?   Do you understand why you were in the hospital? {YES/NO/WILD CARDS:18581}   Do you understand the discharge instrcutions? {YES/NO/WILD CARDS:18581}  Items Reviewed:  Medications reviewed: {YES/NO/WILD CARDS:18581}  Allergies reviewed: {YES/NO/WILD CARDS:18581}  Dietary changes reviewed: {YES/NO/WILD CARDS:18581}  Referrals reviewed: {YES/NO/WILD CARDS:18581}   Functional Questionnaire:   Activities of Daily Living (ADLs):   She states they are independent in the following: {adls:17999} States they require assistance with the following: {adls:17999}   Any transportation issues/concerns?: {YES/NO/WILD CARDS:18581}   Any patient concerns? {YES/NO/WILD CARDS:18581}   Confirmed importance and date/time of follow-up visits scheduled: {YES/NO/WILD CARDS:18581}   Confirmed with patient if condition begins to worsen call PCP or go to the ER.  Patient was given the Call-a-Nurse line [phone]: {YES/NO/WILD CARDS:18581}  

## 2016-02-27 NOTE — Telephone Encounter (Signed)
LMTCB

## 2016-02-28 ENCOUNTER — Emergency Department (HOSPITAL_COMMUNITY)
Admission: EM | Admit: 2016-02-28 | Discharge: 2016-02-28 | Disposition: A | Discharge status: Home / Self Care | Payer: Medicare Other | Attending: Emergency Medicine | Admitting: Emergency Medicine

## 2016-02-28 ENCOUNTER — Emergency Department (HOSPITAL_COMMUNITY): Payer: Medicare Other

## 2016-02-28 ENCOUNTER — Encounter (HOSPITAL_COMMUNITY): Payer: Self-pay

## 2016-02-28 DIAGNOSIS — R1012 Left upper quadrant pain: Secondary | ICD-10-CM | POA: Insufficient documentation

## 2016-02-28 DIAGNOSIS — I2510 Atherosclerotic heart disease of native coronary artery without angina pectoris: Secondary | ICD-10-CM | POA: Diagnosis not present

## 2016-02-28 DIAGNOSIS — F0390 Unspecified dementia without behavioral disturbance: Secondary | ICD-10-CM | POA: Diagnosis not present

## 2016-02-28 DIAGNOSIS — I10 Essential (primary) hypertension: Secondary | ICD-10-CM | POA: Insufficient documentation

## 2016-02-28 DIAGNOSIS — Z7982 Long term (current) use of aspirin: Secondary | ICD-10-CM | POA: Diagnosis not present

## 2016-02-28 DIAGNOSIS — Z87891 Personal history of nicotine dependence: Secondary | ICD-10-CM | POA: Insufficient documentation

## 2016-02-28 DIAGNOSIS — J449 Chronic obstructive pulmonary disease, unspecified: Secondary | ICD-10-CM | POA: Insufficient documentation

## 2016-02-28 DIAGNOSIS — K5790 Diverticulosis of intestine, part unspecified, without perforation or abscess without bleeding: Secondary | ICD-10-CM | POA: Diagnosis not present

## 2016-02-28 DIAGNOSIS — R55 Syncope and collapse: Secondary | ICD-10-CM | POA: Diagnosis not present

## 2016-02-28 DIAGNOSIS — N390 Urinary tract infection, site not specified: Secondary | ICD-10-CM | POA: Diagnosis not present

## 2016-02-28 DIAGNOSIS — Z7951 Long term (current) use of inhaled steroids: Secondary | ICD-10-CM | POA: Insufficient documentation

## 2016-02-28 DIAGNOSIS — J8410 Pulmonary fibrosis, unspecified: Secondary | ICD-10-CM | POA: Diagnosis not present

## 2016-02-28 DIAGNOSIS — D72829 Elevated white blood cell count, unspecified: Secondary | ICD-10-CM | POA: Insufficient documentation

## 2016-02-28 DIAGNOSIS — R509 Fever, unspecified: Secondary | ICD-10-CM | POA: Diagnosis not present

## 2016-02-28 DIAGNOSIS — J439 Emphysema, unspecified: Secondary | ICD-10-CM | POA: Diagnosis not present

## 2016-02-28 DIAGNOSIS — R110 Nausea: Secondary | ICD-10-CM | POA: Diagnosis not present

## 2016-02-28 LAB — CBC WITH DIFFERENTIAL/PLATELET
BASOS PCT: 0 %
Basophils Absolute: 0 10*3/uL (ref 0.0–0.1)
EOS ABS: 0 10*3/uL (ref 0.0–0.7)
Eosinophils Relative: 0 %
HEMATOCRIT: 37.3 % (ref 36.0–46.0)
HEMOGLOBIN: 12.6 g/dL (ref 12.0–15.0)
LYMPHS ABS: 0.8 10*3/uL (ref 0.7–4.0)
Lymphocytes Relative: 4 %
MCH: 28.1 pg (ref 26.0–34.0)
MCHC: 33.8 g/dL (ref 30.0–36.0)
MCV: 83.3 fL (ref 78.0–100.0)
MONO ABS: 0.7 10*3/uL (ref 0.1–1.0)
MONOS PCT: 4 %
NEUTROS PCT: 92 %
Neutro Abs: 16.8 10*3/uL — ABNORMAL HIGH (ref 1.7–7.7)
Platelets: 212 10*3/uL (ref 150–400)
RBC: 4.48 MIL/uL (ref 3.87–5.11)
RDW: 14.1 % (ref 11.5–15.5)
WBC: 18.3 10*3/uL — ABNORMAL HIGH (ref 4.0–10.5)

## 2016-02-28 LAB — URINALYSIS, ROUTINE W REFLEX MICROSCOPIC
GLUCOSE, UA: NEGATIVE mg/dL
Hgb urine dipstick: NEGATIVE
KETONES UR: NEGATIVE mg/dL
NITRITE: NEGATIVE
PH: 6 (ref 5.0–8.0)
Protein, ur: NEGATIVE mg/dL

## 2016-02-28 LAB — URINE MICROSCOPIC-ADD ON: RBC / HPF: NONE SEEN RBC/hpf (ref 0–5)

## 2016-02-28 LAB — COMPREHENSIVE METABOLIC PANEL
ALBUMIN: 3.6 g/dL (ref 3.5–5.0)
ALK PHOS: 79 U/L (ref 38–126)
ALT: 88 U/L — AB (ref 14–54)
AST: 74 U/L — AB (ref 15–41)
Anion gap: 8 (ref 5–15)
BUN: 12 mg/dL (ref 6–20)
CALCIUM: 8.7 mg/dL — AB (ref 8.9–10.3)
CHLORIDE: 106 mmol/L (ref 101–111)
CO2: 23 mmol/L (ref 22–32)
CREATININE: 0.89 mg/dL (ref 0.44–1.00)
GFR calc Af Amer: >60 mL/min (ref >60)
GFR calc non Af Amer: 55 mL/min — ABNORMAL LOW (ref >60)
GLUCOSE: 122 mg/dL — AB (ref 65–99)
Potassium: 4.4 mmol/L (ref 3.5–5.1)
SODIUM: 137 mmol/L (ref 135–145)
Total Bilirubin: 1.6 mg/dL — ABNORMAL HIGH (ref 0.3–1.2)
Total Protein: 6.4 g/dL — ABNORMAL LOW (ref 6.5–8.1)

## 2016-02-28 LAB — I-STAT CG4 LACTIC ACID, ED: Lactic Acid, Venous: 0.9 mmol/L (ref 0.5–1.9)

## 2016-02-28 MED ORDER — IOPAMIDOL (ISOVUE-300) INJECTION 61%
100.0000 mL | Freq: Once | INTRAVENOUS | Status: AC | PRN
  Administered 2016-02-28: 75 mL via INTRAVENOUS

## 2016-02-28 MED ORDER — ONDANSETRON HCL 4 MG/2ML IJ SOLN
4.0000 mg | Freq: Once | INTRAMUSCULAR | Status: AC
  Administered 2016-02-28: 4 mg via INTRAVENOUS
  Filled 2016-02-28: qty 2

## 2016-02-28 MED ORDER — SODIUM CHLORIDE 0.9 % IV BOLUS (SEPSIS)
250.0000 mL | Freq: Once | INTRAVENOUS | Status: AC
  Administered 2016-02-28: 250 mL via INTRAVENOUS

## 2016-02-28 MED ORDER — SODIUM CHLORIDE 0.9 % IV SOLN
INTRAVENOUS | Status: DC
  Administered 2016-02-28: 16:00:00 via INTRAVENOUS

## 2016-02-28 MED ORDER — IOPAMIDOL (ISOVUE-300) INJECTION 61%
15.0000 mL | Freq: Once | INTRAVENOUS | Status: AC | PRN
  Administered 2016-02-28: 15 mL via ORAL

## 2016-02-28 NOTE — Telephone Encounter (Signed)
LMTCB

## 2016-02-28 NOTE — ED Provider Notes (Signed)
[facility] DEPT Provider Note   CSN: [id number] Arrival date & time: 02/28/16  1302     History   Chief Complaint Chief Complaint  Patient presents with  . Fever    HPI [name] [name] is a 82 y.o. female.  Patient sent in from [place] states by EMS. Patient to me complained of left upper quadrant abdominal pain and nausea and vomiting. EMS stated that the nursing facility said the patient attempt 102. Patient appeared to be alert and oriented patient denied any fevers. Temperature upon arrival here was 99.3. Patient does have a history of dementia and is on Aricept.      Past Medical History:  Diagnosis Date  . Airway obstruction   . Allergic rhinitis   . Anal stricture   . ANXIETY 08/16/2008  . Atrial fibrillation ([place])    she denies known hx of afib 02/24/13  . CAD, UNSPECIFIED SITE 10/17/2008  . Candida esophagitis ([place])   . CHOLELITHIASIS 08/23/2008  . Chronic constipation   . Colitis    sigmoid  . Complication of anesthesia    difficult intubation  . COPD 01/04/2010   FeV1 101%, DLCO64% 2008  . COPD (chronic obstructive pulmonary disease) ([place]) 01/04/2010  . 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 Diverticulitis   . DIVERTICULOSIS-COLON 04/07/2008  . Emphysema of lung ([place])   . Esophageal stricture   . Fatty liver   . GERD 04/19/2007  . Hearing aid worn    bilateral  . Hiatal hernia   . History of diverticulitis of colon   . HYPERSOMNIA, ASSOCIATED WITH SLEEP APNEA 02/01/2007  . HYPERTENSION 11/12/2006  . IBS (irritable bowel syndrome)   . MITRAL VALVE PROLAPSE, HX OF 10/07/2008  . NEOPLASM, MALIGNANT, BREAST, RIGHT 02/15/2008  . OSTEOPOROSIS 04/04/2008  . Pulmonary fibrosis ([place])   . Raynaud's syndrome 04/25/2009  . Sleep apnea   . Tortuous colon     Patient Active Problem List   Diagnosis Date Noted  . Syncope 02/23/2016  . Mild dementia 12/07/2015  . Chest pain with moderate risk for cardiac etiology 07/25/2014  . Allergic rhinitis  04/12/2014  . Constipation 04/12/2014  . Memory loss 04/12/2014  . Osteoarthritis, knee 04/12/2014  . Insomnia 11/21/2013  . History of CVA (cerebrovascular accident) 07/29/2013  . Pulmonary nodules 07/29/2013  . Recurrent UTI 07/13/2013  . History of breast cancer, T1b, N0, Lumpectomy 07/21/2007. 04/21/2011  . COPD with emphysema Gold B 01/04/2010  . Raynaud's syndrome 04/25/2009  . GERD 04/19/2007  . Essential hypertension 11/12/2006    Past Surgical History:  Procedure Laterality Date  . ABDOMINAL HYSTERECTOMY    . APPENDECTOMY    . BILATERAL SALPINGOOPHORECTOMY     s/p prolapsed bladder  . BLADDER SURGERY     prolapsed  . BREAST CYST EXCISION     left  . BREAST SURGERY  07-21-07   mastectomy (right), all margins neg and LNs neg   . EYE SURGERY     cataract removal bilateral  . LUNG SURGERY    . MASTECTOMY     right  . MELANOMA EXCISION     rt clavicle Dr Rhoderick [facility] office)  . MOUTH SURGERY    . ROTATOR CUFF REPAIR    . THORACOTOMY     granuloma, pulmonary-thoracotomy  . TONSILLECTOMY      OB History    No data available       Home Medications    Prior to Admission medications   Medication Sig Start Date End Date Taking?  Authorizing Provider  albuterol (PROVENTIL HFA;VENTOLIN HFA) 108 (90 Base) MCG/ACT inhaler Inhale 2 puffs into the lungs every 6 (six) hours as needed for wheezing or shortness of breath. 11/23/15   Dorothyann Peng, NP  aspirin 325 MG tablet Take 325 mg by mouth at bedtime.     Historical Provider, MD  budesonide-formoterol (SYMBICORT) 160-4.5 MCG/ACT inhaler Inhale 2 puffs into the lungs 2 (two) times daily. 02/19/16   Marin Olp, MD  cephALEXin (KEFLEX) 500 MG capsule Take 1 capsule (500 mg total) by mouth every 12 (twelve) hours. 02/24/16   Nita Sells, MD  diazepam (VALIUM) 5 MG tablet TAKE 1 TABLET BY MOUTH AT BEDTIME Patient taking differently: TAKE 1 MG BY MOUTH AT BEDTIME 01/24/16   Marin Olp, MD  donepezil (ARICEPT)  10 MG tablet Take 1/2 tablet daily for 1 month, then increase to 1 tablet daily Patient taking differently: Take 10 mg by mouth at bedtime.  12/07/15   Cameron Sprang, MD  fluticasone (FLONASE) 50 MCG/ACT nasal spray Place 2 sprays into both nostrils daily. Patient taking differently: Place 2 sprays into both nostrils at bedtime.  11/19/15   Delano Metz, FNP  nitroGLYCERIN (NITROSTAT) 0.4 MG SL tablet Place 1 tablet (0.4 mg total) under the tongue every 5 (five) minutes as needed for chest pain (take as directed). 04/12/15   Fay Records, MD    Family History Family History  Problem Relation Age of Onset  . Parkinsonism Father   . Coronary artery disease Father   . Stroke Mother   . Heart failure Mother   . Breast cancer      aunt  . Colon cancer      aunt, age 5  . Uterine cancer      aunt  . Breast cancer Maternal Aunt   . Colon cancer Maternal Aunt   . Uterine cancer Maternal Aunt   . Lung disease Neg Hx     Social History Social History  Substance Use Topics  . Smoking status: Former Smoker    Packs/day: 2.00    Years: 30.00    Types: Cigarettes    Quit date: 05/13/1983  . Smokeless tobacco: Never Used  . Alcohol use 0.0 oz/week     Comment: 1-2 glaases wine per day     Allergies   Azithromycin; Ciprofloxacin; Levaquin [levofloxacin]; Penicillins; and Sulfamethoxazole   Review of Systems Review of Systems  Constitutional: Positive for fever.  HENT: Negative for congestion.   Eyes: Negative for redness.  Respiratory: Negative for shortness of breath.   Cardiovascular: Negative for chest pain and leg swelling.  Gastrointestinal: Positive for abdominal pain, nausea and vomiting. Negative for diarrhea.  Genitourinary: Negative for dysuria.  Musculoskeletal: Negative for back pain.  Skin: Negative for rash.  Neurological: Negative for headaches.  Psychiatric/Behavioral: Positive for confusion.     Physical Exam Updated Vital Signs BP (!) 123/51 (BP  Location: Left Arm)   Pulse 76   Temp 98 F (36.7 C) (Oral)   Resp 18   SpO2 92%   Physical Exam   ED Treatments / Results  Labs (all labs ordered are listed, but only abnormal results are displayed) Labs Reviewed  COMPREHENSIVE METABOLIC PANEL - Abnormal; Notable for the following:       Result Value   Glucose, Bld 122 (*)    Calcium 8.7 (*)    Total Protein 6.4 (*)    AST 74 (*)    ALT 88 (*)  Total Bilirubin 1.6 (*)    GFR calc non Af Amer 55 (*)    All other components within normal limits  URINALYSIS, ROUTINE W REFLEX MICROSCOPIC (NOT AT [facility]) - Abnormal; Notable for the following:    Color, Urine AMBER (*)    APPearance CLOUDY (*)    Specific Gravity, Urine >1.046 (*)    Bilirubin Urine SMALL (*)    Leukocytes, UA SMALL (*)    All other components within normal limits  CBC WITH DIFFERENTIAL/PLATELET - Abnormal; Notable for the following:    WBC 18.3 (*)    Neutro Abs 16.8 (*)    All other components within normal limits  URINE MICROSCOPIC-ADD ON - Abnormal; Notable for the following:    Squamous Epithelial / LPF 0-5 (*)    Bacteria, UA RARE (*)    All other components within normal limits  I-STAT CG4 LACTIC ACID, ED   Results for orders placed or performed during the hospital encounter of 02/28/16  Comprehensive metabolic panel  Result Value Ref Range   Sodium 137 135 - 145 mmol/L   Potassium 4.4 3.5 - 5.1 mmol/L   Chloride 106 101 - 111 mmol/L   CO2 23 22 - 32 mmol/L   Glucose, Bld 122 (H) 65 - 99 mg/dL   BUN 12 6 - 20 mg/dL   Creatinine, Ser 0.89 0.44 - 1.00 mg/dL   Calcium 8.7 (L) 8.9 - 10.3 mg/dL   Total Protein 6.4 (L) 6.5 - 8.1 g/dL   Albumin 3.6 3.5 - 5.0 g/dL   AST 74 (H) 15 - 41 U/L   ALT 88 (H) 14 - 54 U/L   Alkaline Phosphatase 79 38 - 126 U/L   Total Bilirubin 1.6 (H) 0.3 - 1.2 mg/dL   GFR calc non Af Amer 55 (L) >60 mL/min   GFR calc Af Amer >60 >60 mL/min   Anion gap 8 5 - 15  Urinalysis, Routine w reflex microscopic (not at [facility])    Result Value Ref Range   Color, Urine AMBER (A) YELLOW   APPearance CLOUDY (A) CLEAR   Specific Gravity, Urine >1.046 (H) 1.005 - 1.030   pH 6.0 5.0 - 8.0   Glucose, UA NEGATIVE NEGATIVE mg/dL   Hgb urine dipstick NEGATIVE NEGATIVE   Bilirubin Urine SMALL (A) NEGATIVE   Ketones, ur NEGATIVE NEGATIVE mg/dL   Protein, ur NEGATIVE NEGATIVE mg/dL   Nitrite NEGATIVE NEGATIVE   Leukocytes, UA SMALL (A) NEGATIVE  CBC with Differential/Platelet  Result Value Ref Range   WBC 18.3 (H) 4.0 - 10.5 K/uL   RBC 4.48 3.87 - 5.11 MIL/uL   Hemoglobin 12.6 12.0 - 15.0 g/dL   HCT 37.3 36.0 - 46.0 %   MCV 83.3 78.0 - 100.0 fL   MCH 28.1 26.0 - 34.0 pg   MCHC 33.8 30.0 - 36.0 g/dL   RDW 14.1 11.5 - 15.5 %   Platelets 212 150 - 400 K/uL   Neutrophils Relative % 92 %   Neutro Abs 16.8 (H) 1.7 - 7.7 K/uL   Lymphocytes Relative 4 %   Lymphs Abs 0.8 0.7 - 4.0 K/uL   Monocytes Relative 4 %   Monocytes Absolute 0.7 0.1 - 1.0 K/uL   Eosinophils Relative 0 %   Eosinophils Absolute 0.0 0.0 - 0.7 K/uL   Basophils Relative 0 %   Basophils Absolute 0.0 0.0 - 0.1 K/uL  Urine microscopic-add on  Result Value Ref Range   Squamous Epithelial / LPF 0-5 (A) NONE SEEN  WBC, UA 0-5 0 - 5 WBC/hpf   RBC / HPF NONE SEEN 0 - 5 RBC/hpf   Bacteria, UA RARE (A) NONE SEEN  I-Stat CG4 Lactic Acid, ED  Result Value Ref Range   Lactic Acid, Venous 0.90 0.5 - 1.9 mmol/L    EKG  EKG Interpretation  Date/Time:  Thursday February 28 2016 15:15:53 EDT Ventricular Rate:  96 PR Interval:    QRS Duration: 91 QT Interval:  407 QTC Calculation: 400 R Axis:   78 Text Interpretation:  Sinus rhythm Supraventricular bigeminy Borderline low voltage, extremity leads New since previous tracing Confirmed by Magnolia Mattila  MD, Anadia Helmes [id number]) on 02/28/2016 3:27:28 PM       Radiology Ct Abdomen Pelvis W Contrast  Result Date: 02/28/2016 CLINICAL DATA:  Abdominal pain and fever EXAM: CT ABDOMEN AND PELVIS WITH CONTRAST TECHNIQUE:  Multidetector CT imaging of the abdomen and pelvis was performed using the standard protocol following bolus administration of intravenous contrast. CONTRAST:  98mL ISOVUE-300 IOPAMIDOL (ISOVUE-300) INJECTION 61% COMPARISON:  12/21/2013 FINDINGS: Lower chest: Hypoventilatory changes with increased bibasilar scattered atelectasis and or scarring. Pectus excavatum deformity noted of the sternum and xiphoid. Normal heart size. Native coronary atherosclerosis. Aortic atherosclerosis is well. No pericardial or pleural effusion. Hepatobiliary: No focal hepatic abnormality or biliary dilatation. Patent hepatic and portal veins. Incidental layering gallstone, image 31. No surrounding inflammatory process or biliary dilatation. No interval change. Pancreas: Thin appearing and mildly atrophic. No ductal dilatation or focal abnormality. No surrounding inflammatory process or fluid collection. Spleen: Normal in size without focal abnormality. Adrenals/Urinary Tract: Normal adrenal glands for age. Stable right cortical and left parapelvic cyst. No renal obstruction or hydronephrosis. No other focal renal abnormality or obstructing ureteral calculus. Stomach/Bowel: Negative for bowel obstruction, significant dilatation, ileus, or free air. Extensive colonic diverticulosis without acute inflammatory process. No fluid collection, abscess or adenopathy. Appendix not demonstrated. Vascular/Lymphatic: Aortic atherosclerosis evident without occlusive process, aneurysm or dissection. No retroperitoneal hemorrhage or hematoma. Negative for adenopathy. Reproductive: Remote hysterectomy. No adnexal abnormality or pelvic free fluid. Other: No inguinal abnormality or hernia.  Intact abdominal wall. Musculoskeletal: Bones are osteopenic. Degenerative changes of the spine. Pectus excavatum deformity of the anterior chest. No acute compression fracture. Stable grade 1 anterolisthesis of L3 upon L4 secondary to degenerative facet arthropathy. No  visualized pars defects. IMPRESSION: Increased bibasilar atelectasis versus scarring. Incidental cholelithiasis without acute inflammatory process Extensive colonic diverticulosis Abdominal aortic atherosclerosis without aneurysm No acute intra-abdominal or pelvic finding by CT. Electronically Signed   By: Jerilynn Mages.  Shick M.D.   On: 02/28/2016 17:05   Dg Abd Acute W/chest  Result Date: 02/28/2016 CLINICAL DATA:  Fever. EXAM: DG ABDOMEN ACUTE W/ 1V CHEST COMPARISON:  02/23/2016.  11/23/2015.  12/21/2013. FINDINGS: Mediastinum hilar structures are normal. Stable cardiomegaly. Stable pleural-parenchymal thickening on the left consistent with scarring. Soft tissue structures of the abdomen unremarkable. Single loop of air-filled small bowel noted. This is a nonspecific finding. Colonic gas pattern is unremarkable. Stool noted throughout the colon. No free air. Aortoiliac atherosclerotic vascular calcification. Degenerative changes thoracolumbar spine and both hips. Diffuse osteopenia. IMPRESSION: 1. Left lung pleural-parenchymal scarring.  Stable cardiomegaly. 2. Single nonspecific loop of air-filled small bowel in the left upper quadrant. Colonic gas pattern is normal. Stool in the colon. Follow-up abdominal series can be obtained to exclude developing small bowel distention. 3.  Aortoiliac atherosclerotic vascular calcification. Electronically Signed   By: Marcello Moores  Register   On: 02/28/2016 15:23    Procedures Procedures (including critical  care time)  Medications Ordered in ED Medications  0.9 %  sodium chloride infusion ( Intravenous New Bag/Given 02/28/16 1550)  ondansetron (ZOFRAN) injection 4 mg (4 mg Intravenous Given 02/28/16 1550)  sodium chloride 0.9 % bolus 250 mL (250 mLs Intravenous New Bag/Given 02/28/16 1428)  iopamidol (ISOVUE-300) 61 % injection 15 mL (15 mLs Oral Contrast Given 02/28/16 1604)  iopamidol (ISOVUE-300) 61 % injection 100 mL (75 mLs Intravenous Contrast Given 02/28/16 1640)      Initial Impression / Assessment and Plan / ED Course  I have reviewed the triage vital signs and the nursing notes.  Pertinent labs & imaging results that were available during my care of the patient were reviewed by me and considered in my medical decision making (see chart for details).  Clinical Course   Extensive workup for patient's complaint was left upper quadrant abdominal pain and nausea and vomiting. Nursing facility made note of fevers.  Patients abdominal CT without any acute findings. Chest x-ray negative. Urinalysis negative. However she does have a leukocytosis with a left shift on the differential. That was the only significant finding. Patient also was slightly dehydrated with urine be in somewhat concentrated. Patient received IV fluids here. Patient stable for discharge back to nursing facility and peri-careful observation will return for any new or worse symptoms. Nothing specifically found to treat. Follow-up of the white blood cell count will be important. No vomiting here.  Lactic acid was normal vital signs without any evidence of concern for sepsis at this time.   Final Clinical Impressions(s) / ED Diagnoses   Final diagnoses:  Leukocytosis, unspecified type  Left upper quadrant pain    New Prescriptions New Prescriptions   No medications on file     Fredia Sorrow, MD 02/28/16 [phone]

## 2016-02-28 NOTE — ED Notes (Signed)
Bed: WA09 Expected date:  Expected time:  Means of arrival:  Comments: EMS 

## 2016-02-28 NOTE — ED Triage Notes (Signed)
Per EMS, temporal temp of 102-oriented X4-has not taken anything for temp-no complaints from patient

## 2016-02-28 NOTE — ED Notes (Signed)
Pt reports living at [facility].  Denies pain other than to her back from the stretcher as she is lying flat.  She is AxOx4.  T=99.3 oral at present.

## 2016-02-28 NOTE — ED Notes (Signed)
A family friend will be coming to get pt to take back to independent living center

## 2016-02-28 NOTE — Discharge Instructions (Signed)
No fevers here. CT of the abdomen without any acute findings. No evidence of urinary tract infection. Chest x-ray negative for pneumonia. However patient does have a leukocytosis this will need to be followed carefully. Patient will need to return for any new or worse symptoms. Patient was some mild dehydration but treated with IV fluids here. Patient stable for discharge back to nursing facility.

## 2016-02-28 NOTE — Telephone Encounter (Signed)
Keep orders the same as fine- pending what we find out from hospital trip

## 2016-02-28 NOTE — Telephone Encounter (Signed)
[facility] following up on PT request. They went out to see pt today, and was sent pt back to the hospital. Pt had a fever, and vomiting.  If pt is not admitted they will pick her back up and would like to keep orders the same.  If you want to get nursing involved they can do that as well.

## 2016-02-28 NOTE — ED Notes (Signed)
Pt's IV infiltrated and removed

## 2016-02-29 DIAGNOSIS — R55 Syncope and collapse: Secondary | ICD-10-CM | POA: Diagnosis not present

## 2016-02-29 DIAGNOSIS — J439 Emphysema, unspecified: Secondary | ICD-10-CM | POA: Diagnosis not present

## 2016-02-29 DIAGNOSIS — I2510 Atherosclerotic heart disease of native coronary artery without angina pectoris: Secondary | ICD-10-CM | POA: Diagnosis not present

## 2016-02-29 DIAGNOSIS — J8410 Pulmonary fibrosis, unspecified: Secondary | ICD-10-CM | POA: Diagnosis not present

## 2016-02-29 DIAGNOSIS — N390 Urinary tract infection, site not specified: Secondary | ICD-10-CM | POA: Diagnosis not present

## 2016-02-29 DIAGNOSIS — F0390 Unspecified dementia without behavioral disturbance: Secondary | ICD-10-CM | POA: Diagnosis not present

## 2016-02-29 NOTE — Telephone Encounter (Signed)
Called and left a voicemail message letting her know that depending on what was determined in hospital should be fine to keep same orders.

## 2016-02-29 NOTE — Telephone Encounter (Signed)
Unable to reach pt's daughter. Pt is a resident at [facility] and has dementia. Unable to schedule for TCM visit.

## 2016-03-04 DIAGNOSIS — N390 Urinary tract infection, site not specified: Secondary | ICD-10-CM | POA: Diagnosis not present

## 2016-03-04 DIAGNOSIS — R55 Syncope and collapse: Secondary | ICD-10-CM | POA: Diagnosis not present

## 2016-03-04 DIAGNOSIS — J8410 Pulmonary fibrosis, unspecified: Secondary | ICD-10-CM | POA: Diagnosis not present

## 2016-03-04 DIAGNOSIS — I2510 Atherosclerotic heart disease of native coronary artery without angina pectoris: Secondary | ICD-10-CM | POA: Diagnosis not present

## 2016-03-04 DIAGNOSIS — F0390 Unspecified dementia without behavioral disturbance: Secondary | ICD-10-CM | POA: Diagnosis not present

## 2016-03-04 DIAGNOSIS — J439 Emphysema, unspecified: Secondary | ICD-10-CM | POA: Diagnosis not present

## 2016-03-07 ENCOUNTER — Encounter: Payer: Self-pay | Admitting: Family Medicine

## 2016-03-07 ENCOUNTER — Ambulatory Visit (INDEPENDENT_AMBULATORY_CARE_PROVIDER_SITE_OTHER): Payer: Medicare Other | Admitting: Family Medicine

## 2016-03-07 VITALS — BP 100/64 | HR 77 | Temp 97.8°F | Wt 119.4 lb

## 2016-03-07 DIAGNOSIS — R55 Syncope and collapse: Secondary | ICD-10-CM | POA: Diagnosis not present

## 2016-03-07 DIAGNOSIS — I700 Atherosclerosis of aorta: Secondary | ICD-10-CM | POA: Diagnosis not present

## 2016-03-07 DIAGNOSIS — I2510 Atherosclerotic heart disease of native coronary artery without angina pectoris: Secondary | ICD-10-CM | POA: Diagnosis not present

## 2016-03-07 DIAGNOSIS — N390 Urinary tract infection, site not specified: Secondary | ICD-10-CM | POA: Diagnosis not present

## 2016-03-07 DIAGNOSIS — D72829 Elevated white blood cell count, unspecified: Secondary | ICD-10-CM | POA: Diagnosis not present

## 2016-03-07 DIAGNOSIS — N3000 Acute cystitis without hematuria: Secondary | ICD-10-CM

## 2016-03-07 DIAGNOSIS — G301 Alzheimer's disease with late onset: Secondary | ICD-10-CM

## 2016-03-07 DIAGNOSIS — F0390 Unspecified dementia without behavioral disturbance: Secondary | ICD-10-CM | POA: Diagnosis not present

## 2016-03-07 DIAGNOSIS — I10 Essential (primary) hypertension: Secondary | ICD-10-CM

## 2016-03-07 DIAGNOSIS — I5189 Other ill-defined heart diseases: Secondary | ICD-10-CM

## 2016-03-07 DIAGNOSIS — F0280 Dementia in other diseases classified elsewhere without behavioral disturbance: Secondary | ICD-10-CM

## 2016-03-07 DIAGNOSIS — I519 Heart disease, unspecified: Secondary | ICD-10-CM

## 2016-03-07 DIAGNOSIS — J439 Emphysema, unspecified: Secondary | ICD-10-CM | POA: Diagnosis not present

## 2016-03-07 DIAGNOSIS — J8410 Pulmonary fibrosis, unspecified: Secondary | ICD-10-CM | POA: Diagnosis not present

## 2016-03-07 LAB — COMPREHENSIVE METABOLIC PANEL
ALT: 12 U/L (ref 0–35)
AST: 12 U/L (ref 0–37)
Albumin: 4.1 g/dL (ref 3.5–5.2)
Alkaline Phosphatase: 73 U/L (ref 39–117)
BUN: 15 mg/dL (ref 6–23)
CALCIUM: 9.6 mg/dL (ref 8.4–10.5)
CHLORIDE: 100 meq/L (ref 96–112)
CO2: 31 meq/L (ref 19–32)
Creatinine, Ser: 0.82 mg/dL (ref 0.40–1.20)
GFR: 69.56 mL/min (ref >60.00)
GLUCOSE: 119 mg/dL — AB (ref 70–99)
POTASSIUM: 4.2 meq/L (ref 3.5–5.1)
Sodium: 138 mEq/L (ref 135–145)
Total Bilirubin: 0.5 mg/dL (ref 0.2–1.2)
Total Protein: 6.7 g/dL (ref 6.0–8.3)

## 2016-03-07 LAB — CBC WITH DIFFERENTIAL/PLATELET
BASOS ABS: 0 10*3/uL (ref 0.0–0.1)
BASOS PCT: 0.4 % (ref 0.0–3.0)
Eosinophils Absolute: 0.1 10*3/uL (ref 0.0–0.7)
Eosinophils Relative: 0.6 % (ref 0.0–5.0)
HCT: 42.2 % (ref 36.0–46.0)
Hemoglobin: 14.1 g/dL (ref 12.0–15.0)
LYMPHS ABS: 1.7 10*3/uL (ref 0.7–4.0)
Lymphocytes Relative: 14.8 % (ref 12.0–46.0)
MCHC: 33.3 g/dL (ref 30.0–36.0)
MCV: 85.6 fl (ref 78.0–100.0)
MONOS PCT: 7.6 % (ref 3.0–12.0)
Monocytes Absolute: 0.9 10*3/uL (ref 0.1–1.0)
NEUTROS ABS: 8.9 10*3/uL — AB (ref 1.4–7.7)
NEUTROS PCT: 76.6 % (ref 43.0–77.0)
PLATELETS: 316 10*3/uL (ref 150.0–400.0)
RBC: 4.93 Mil/uL (ref 3.87–5.11)
RDW: 14.8 % (ref 11.5–15.5)
WBC: 11.7 10*3/uL — ABNORMAL HIGH (ref 4.0–10.5)

## 2016-03-07 NOTE — Progress Notes (Signed)
Subjective:  [name] [name] is a 82 y.o. year old very pleasant female patient who presents for transitional care management and hospital follow up for syncope thought likely to be orthostatic in nature. Patient was hospitalized from 02/22/16 to 02/26/16. A TCM phone call was completed on 02/27/16. Medical complexity moderate  Patient was admitted after syncopal episode. She was found to be orthostatic and all BP meds were discontinued. Her BPs on discharge were elevated but plan was to tolerate some higher #s to prevent falls/orthostasis it appears. Risk here is that she had prior CVA. There was concern for UTI and she was started on ceftriaxone and later transitioned to keflex which she has finished a course of. Due to syncope ahad echocardiogram which showed diastolic dysfunction with EF 60-65% without volume overload. She had troponins cycled which were negative. Apparently did report some chet  pai initially which has since resolved.   She was discharged back to independent living with therapy. Patient had to return to the emergency room a few days later with some diarrhea, abdominal pain and fever and had CT abd/pelvis without acute cause- had some gallstones without inflammation, diverticulosis without diverticulitis. She was discharged back home. Also noted leukocytosis to over 18k.  Also noted to have abdominal aortic atherosclerosis. Apparently fever and abdominal pain resolved there and able to be discharged  Daughter says she has not been able to go to dining hall since discharge and has food delivered to her room. Patient has some baseline memory loss due to dementia.  Daughter mentions more forgetful than normal. Daughter is very concerned because patient has not seemed to start moving back to baseline since hospitalization. Patient not doing well from memory and physical perspective.   ROS- hard of hearing, more forgetful, no chest pain or shortness of breath, no lightheadedness with standing.see  any ROS included in HPI as well.   Past Medical History-  Patient Active Problem List   Diagnosis Date Noted  . Syncope 02/23/2016    Priority: High  . Memory loss 04/12/2014    Priority: High  . History of CVA (cerebrovascular accident) 07/29/2013    Priority: High  . Pulmonary nodules 07/29/2013    Priority: High  . COPD with emphysema Gold B 01/04/2010    Priority: High  . Diastolic dysfunction 0000000    Priority: Medium  . Insomnia 11/21/2013    Priority: Medium  . Recurrent UTI 07/13/2013    Priority: Medium  . History of breast cancer, T1b, N0, Lumpectomy 07/21/2007. 04/21/2011    Priority: Medium  . Essential hypertension 11/12/2006    Priority: Medium  . Allergic rhinitis 04/12/2014    Priority: Low  . Constipation 04/12/2014    Priority: Low  . Osteoarthritis, knee 04/12/2014    Priority: Low  . Raynaud's syndrome 04/25/2009    Priority: Low  . GERD 04/19/2007    Priority: Low  . Abdominal aortic atherosclerosis ([place]) 03/08/2016    Medications- reviewed and updated Current Outpatient Prescriptions  Medication Sig Dispense Refill  . albuterol (PROVENTIL HFA;VENTOLIN HFA) 108 (90 Base) MCG/ACT inhaler Inhale 2 puffs into the lungs every 6 (six) hours as needed for wheezing or shortness of breath. 1 Inhaler 0  . aspirin 325 MG tablet Take 325 mg by mouth at bedtime.     . budesonide-formoterol (SYMBICORT) 160-4.5 MCG/ACT inhaler Inhale 2 puffs into the lungs 2 (two) times daily. 1 Inhaler 12  . diazepam (VALIUM) 5 MG tablet TAKE 1 TABLET BY MOUTH AT BEDTIME (Patient taking  differently: TAKE 1 MG BY MOUTH AT BEDTIME) 30 tablet 2  . donepezil (ARICEPT) 10 MG tablet Take 1/2 tablet daily for 1 month, then increase to 1 tablet daily (Patient taking differently: Take 10 mg by mouth at bedtime. ) 30 tablet 11  . fluticasone (FLONASE) 50 MCG/ACT nasal spray Place 2 sprays into both nostrils daily. (Patient taking differently: Place 2 sprays into both nostrils at  bedtime. )    . nitroGLYCERIN (NITROSTAT) 0.4 MG SL tablet Place 1 tablet (0.4 mg total) under the tongue every 5 (five) minutes as needed for chest pain (take as directed). 25 tablet 3    Objective: BP 100/64 (BP Location: Left Arm, Patient Position: Sitting, Cuff Size: Normal)   Pulse 77   Temp 97.8 F (36.6 C) (Oral)   Wt 119 lb 6.4 oz (54.2 kg)   SpO2 93%   BMI 21.84 kg/m  Gen: NAD, resting comfortably, pleasantly confused, seems to have less energy than prior visits Mucous membranes are moist. CV: RRR no murmurs rubs or gallops Lungs: CTAB no crackles, wheeze, rhonchi Abdomen: soft/nontender/nondistended/normal bowel sounds. No rebound or guarding.  No cva tenderness Ext: no edema  Assessment/Plan:   Abdominal aortic atherosclerosis (HCC) S: noted on abdominal Ct A/P: we discussed importance of BP and lipid control- lipids fine without meds- having to come off BP medicine due to orthostasis    Recurrent UTI Leukocytosis Hypertension S: with leukocytosis over 18k in ED- rechecked CBC today and trending down. Still not clear why this is elevated with no obvious infectious process ongoing.  A/P: discussed with daughter/patient would get urine culture today to rule out infectious cause there. Already had ct abd/pelvis when leukocytosis at its peak and had CXR and abdominal films as well. Daughter is vigilant with mother- discussed perhaps 2 week follow up but daughter states will bring mother if not improving or worsening symptoms but definitely recheck in 1 month. I expressed my concern for patient's seeming difficulty in returning to baseline but we also discussed often takes prolonged period to restore health after hospitalization and she was in 5 days. I also do not have a good feel for why her blood pressure is so low without medication- we will closely monitor but remain off BP meds.    Essential hypertension Amlodipine 2.5mg , losartan 50mg --> orthostatic with syncopal  episode so stopped 02/2016. Remain off for now.   Syncope Thought orthostatic. No otheretiology found between troponins, echocardiogram, ct abd/pelvis. Discussed if patient not making turn for the better to consider brain imaging for ? Of CVA  Alzheimer's dementia Dementia seems to be worsening- patient does appear to at least still be eating. Wonder if this is contributing to her decline though. Continue aricept  Daughter and I both expressed concern for patient trajectory- close follow up- wonder about palliative consult.   Orders Placed This Encounter  Procedures  . Urine culture    [facility]  . CBC with Differential/Platelet  . Comprehensive metabolic panel       Return precautions advised.  Garret Reddish, MD

## 2016-03-07 NOTE — Patient Instructions (Signed)
Blood and urine before you leave  Definitely see me back in 1 month, I am tempted to have you back in 2 weeks to reassess- definitely see me sooner if symptoms worsen  Depending on bloodwork- may need to do further workup

## 2016-03-07 NOTE — Progress Notes (Signed)
Pre visit review using our clinic review tool, if applicable. No additional management support is needed unless otherwise documented below in the visit note. 

## 2016-03-08 DIAGNOSIS — I5189 Other ill-defined heart diseases: Secondary | ICD-10-CM | POA: Insufficient documentation

## 2016-03-08 DIAGNOSIS — I700 Atherosclerosis of aorta: Secondary | ICD-10-CM | POA: Insufficient documentation

## 2016-03-08 NOTE — Assessment & Plan Note (Signed)
Leukocytosis Hypertension S: with leukocytosis over 18k in ED- rechecked CBC today and trending down. Still not clear why this is elevated with no obvious infectious process ongoing.  A/P: discussed with daughter/patient would get urine culture today to rule out infectious cause there. Already had ct abd/pelvis when leukocytosis at its peak and had CXR and abdominal films as well. Daughter is vigilant with mother- discussed perhaps 2 week follow up but daughter states will bring mother if not improving or worsening symptoms but definitely recheck in 1 month. I expressed my concern for patient's seeming difficulty in returning to baseline but we also discussed often takes prolonged period to restore health after hospitalization and she was in 5 days. I also do not have a good feel for why her blood pressure is so low without medication- we will closely monitor but remain off BP meds.

## 2016-03-08 NOTE — Assessment & Plan Note (Signed)
Thought orthostatic. No otheretiology found between troponins, echocardiogram, ct abd/pelvis. Discussed if patient not making turn for the better to consider brain imaging for ? Of CVA

## 2016-03-08 NOTE — Assessment & Plan Note (Signed)
Dementia seems to be worsening- patient does appear to at least still be eating. Wonder if this is contributing to her decline though. Continue aricept

## 2016-03-08 NOTE — Assessment & Plan Note (Signed)
S: noted on abdominal Ct A/P: we discussed importance of BP and lipid control- lipids fine without meds- having to come off BP medicine due to orthostasis

## 2016-03-08 NOTE — Assessment & Plan Note (Signed)
Amlodipine 2.5mg , losartan 50mg --> orthostatic with syncopal episode so stopped 02/2016. Remain off for now.

## 2016-03-09 LAB — URINE CULTURE

## 2016-03-10 DIAGNOSIS — N390 Urinary tract infection, site not specified: Secondary | ICD-10-CM | POA: Diagnosis not present

## 2016-03-10 DIAGNOSIS — J8410 Pulmonary fibrosis, unspecified: Secondary | ICD-10-CM | POA: Diagnosis not present

## 2016-03-10 DIAGNOSIS — I2510 Atherosclerotic heart disease of native coronary artery without angina pectoris: Secondary | ICD-10-CM | POA: Diagnosis not present

## 2016-03-10 DIAGNOSIS — R55 Syncope and collapse: Secondary | ICD-10-CM | POA: Diagnosis not present

## 2016-03-10 DIAGNOSIS — J439 Emphysema, unspecified: Secondary | ICD-10-CM | POA: Diagnosis not present

## 2016-03-10 DIAGNOSIS — F0390 Unspecified dementia without behavioral disturbance: Secondary | ICD-10-CM | POA: Diagnosis not present

## 2016-03-11 DIAGNOSIS — I2510 Atherosclerotic heart disease of native coronary artery without angina pectoris: Secondary | ICD-10-CM | POA: Diagnosis not present

## 2016-03-11 DIAGNOSIS — N390 Urinary tract infection, site not specified: Secondary | ICD-10-CM | POA: Diagnosis not present

## 2016-03-11 DIAGNOSIS — J439 Emphysema, unspecified: Secondary | ICD-10-CM | POA: Diagnosis not present

## 2016-03-11 DIAGNOSIS — R55 Syncope and collapse: Secondary | ICD-10-CM | POA: Diagnosis not present

## 2016-03-11 DIAGNOSIS — F0390 Unspecified dementia without behavioral disturbance: Secondary | ICD-10-CM | POA: Diagnosis not present

## 2016-03-11 DIAGNOSIS — J8410 Pulmonary fibrosis, unspecified: Secondary | ICD-10-CM | POA: Diagnosis not present

## 2016-03-12 DIAGNOSIS — F0390 Unspecified dementia without behavioral disturbance: Secondary | ICD-10-CM | POA: Diagnosis not present

## 2016-03-12 DIAGNOSIS — I2510 Atherosclerotic heart disease of native coronary artery without angina pectoris: Secondary | ICD-10-CM | POA: Diagnosis not present

## 2016-03-12 DIAGNOSIS — R55 Syncope and collapse: Secondary | ICD-10-CM | POA: Diagnosis not present

## 2016-03-12 DIAGNOSIS — J8410 Pulmonary fibrosis, unspecified: Secondary | ICD-10-CM | POA: Diagnosis not present

## 2016-03-12 DIAGNOSIS — N390 Urinary tract infection, site not specified: Secondary | ICD-10-CM | POA: Diagnosis not present

## 2016-03-12 DIAGNOSIS — J439 Emphysema, unspecified: Secondary | ICD-10-CM | POA: Diagnosis not present

## 2016-03-13 DIAGNOSIS — F0390 Unspecified dementia without behavioral disturbance: Secondary | ICD-10-CM | POA: Diagnosis not present

## 2016-03-13 DIAGNOSIS — N390 Urinary tract infection, site not specified: Secondary | ICD-10-CM | POA: Diagnosis not present

## 2016-03-13 DIAGNOSIS — J439 Emphysema, unspecified: Secondary | ICD-10-CM | POA: Diagnosis not present

## 2016-03-13 DIAGNOSIS — I2510 Atherosclerotic heart disease of native coronary artery without angina pectoris: Secondary | ICD-10-CM | POA: Diagnosis not present

## 2016-03-13 DIAGNOSIS — J8410 Pulmonary fibrosis, unspecified: Secondary | ICD-10-CM | POA: Diagnosis not present

## 2016-03-13 DIAGNOSIS — R55 Syncope and collapse: Secondary | ICD-10-CM | POA: Diagnosis not present

## 2016-03-17 ENCOUNTER — Other Ambulatory Visit: Payer: Self-pay | Admitting: Family Medicine

## 2016-03-17 DIAGNOSIS — F0390 Unspecified dementia without behavioral disturbance: Secondary | ICD-10-CM | POA: Diagnosis not present

## 2016-03-17 DIAGNOSIS — R55 Syncope and collapse: Secondary | ICD-10-CM | POA: Diagnosis not present

## 2016-03-17 DIAGNOSIS — N390 Urinary tract infection, site not specified: Secondary | ICD-10-CM | POA: Diagnosis not present

## 2016-03-17 DIAGNOSIS — I2510 Atherosclerotic heart disease of native coronary artery without angina pectoris: Secondary | ICD-10-CM | POA: Diagnosis not present

## 2016-03-17 DIAGNOSIS — J8410 Pulmonary fibrosis, unspecified: Secondary | ICD-10-CM | POA: Diagnosis not present

## 2016-03-17 DIAGNOSIS — J439 Emphysema, unspecified: Secondary | ICD-10-CM | POA: Diagnosis not present

## 2016-03-18 DIAGNOSIS — J8410 Pulmonary fibrosis, unspecified: Secondary | ICD-10-CM | POA: Diagnosis not present

## 2016-03-18 DIAGNOSIS — F0390 Unspecified dementia without behavioral disturbance: Secondary | ICD-10-CM | POA: Diagnosis not present

## 2016-03-18 DIAGNOSIS — I2510 Atherosclerotic heart disease of native coronary artery without angina pectoris: Secondary | ICD-10-CM | POA: Diagnosis not present

## 2016-03-18 DIAGNOSIS — J439 Emphysema, unspecified: Secondary | ICD-10-CM | POA: Diagnosis not present

## 2016-03-18 DIAGNOSIS — R55 Syncope and collapse: Secondary | ICD-10-CM | POA: Diagnosis not present

## 2016-03-18 DIAGNOSIS — N390 Urinary tract infection, site not specified: Secondary | ICD-10-CM | POA: Diagnosis not present

## 2016-03-19 ENCOUNTER — Encounter: Payer: Self-pay | Admitting: Adult Health

## 2016-03-19 ENCOUNTER — Ambulatory Visit (INDEPENDENT_AMBULATORY_CARE_PROVIDER_SITE_OTHER): Payer: Medicare Other | Admitting: Adult Health

## 2016-03-19 VITALS — BP 138/72 | Temp 97.5°F | Ht 62.0 in | Wt 118.2 lb

## 2016-03-19 DIAGNOSIS — R350 Frequency of micturition: Secondary | ICD-10-CM

## 2016-03-19 DIAGNOSIS — R300 Dysuria: Secondary | ICD-10-CM | POA: Diagnosis not present

## 2016-03-19 DIAGNOSIS — D72829 Elevated white blood cell count, unspecified: Secondary | ICD-10-CM

## 2016-03-19 DIAGNOSIS — R197 Diarrhea, unspecified: Secondary | ICD-10-CM | POA: Diagnosis not present

## 2016-03-19 DIAGNOSIS — R110 Nausea: Secondary | ICD-10-CM

## 2016-03-19 LAB — CBC
HCT: 45 % (ref 36.0–46.0)
Hemoglobin: 14.7 g/dL (ref 12.0–15.0)
MCHC: 32.7 g/dL (ref 30.0–36.0)
MCV: 87.1 fl (ref 78.0–100.0)
Platelets: 234 10*3/uL (ref 150.0–400.0)
RBC: 5.16 Mil/uL — ABNORMAL HIGH (ref 3.87–5.11)
RDW: 15.1 % (ref 11.5–15.5)
WBC: 8 10*3/uL (ref 4.0–10.5)

## 2016-03-19 LAB — POCT URINALYSIS DIPSTICK
Bilirubin, UA: NEGATIVE
Glucose, UA: NEGATIVE
KETONES UA: NEGATIVE
LEUKOCYTES UA: NEGATIVE
Nitrite, UA: NEGATIVE
PH UA: 6
SPEC GRAV UA: 1.025
Urobilinogen, UA: NEGATIVE

## 2016-03-19 LAB — H. PYLORI ANTIBODY, IGG: H PYLORI IGG: NEGATIVE

## 2016-03-19 MED ORDER — ONDANSETRON HCL 4 MG PO TABS: 4.0000 mg | ORAL_TABLET | Freq: Three times a day (TID) | ORAL | 0 refills | Status: DC | PRN

## 2016-03-19 NOTE — Progress Notes (Signed)
Subjective:    Patient ID: [name] [name], female    DOB: December 13, 1923, 82 y.o.   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 She is a patient of Dr. Yong Channel who I am seeing today for the first time.   Per provider notes:   She was seen in the ER after a syncopal episode. She was found to be orthostatic and all BP medications were discontinued. There was also concern for a UTI and she was started of ceftrixzone and later transitioned to keflex in which she finished the course.    She was discharged back to independent living with therapy. Patient had to return to the emergency room a few days later with some diarrhea, abdominal pain and fever and had CT abd/pelvis without acute cause- had some gallstones without inflammation, diverticulosis without diverticulitis. She was discharged back home. Also noted leukocytosis to over 18k.  Also noted to have abdominal aortic atherosclerosis. Apparently fever and abdominal pain resolved there and able to be discharged  She saw her PCP on 03/07/2016 and  her leukocytosis had improved from 18.3 to 11.7.   She continues to have nausea, abdominal pain and diarrhea. She took a pill last night ( unsure of what it was) and it stopped the diarrhea- as she has not had any since last night.   Daughter in concerned that she may have another UTI as the patient is complaining of burning. She does wear diapers.   She denies any fevers or vomiting.    Review of Systems  Constitutional: Positive for activity change and fatigue. Negative for chills, diaphoresis and fever.  HENT: Negative.   Respiratory: Negative.   Cardiovascular: Negative.   Gastrointestinal: Positive for abdominal pain, diarrhea and nausea. Negative for anal bleeding, blood in stool, constipation and vomiting.  Musculoskeletal: Positive for gait problem.  Skin: Negative.   All other systems reviewed and are negative.      Objective:   Physical Exam  Neck: Normal range of motion. Neck supple.  Cardiovascular: Normal rate, regular rhythm, normal heart sounds and intact distal pulses.  Exam reveals no gallop and no friction rub.   No murmur heard. Pulmonary/Chest: Effort normal and breath sounds normal. No respiratory distress. She has no wheezes. She has no rales. She exhibits no tenderness.  Abdominal: Soft. Bowel sounds are normal. She exhibits no distension and no mass. There is tenderness in the epigastric area and periumbilical area. There is no rebound and no guarding.  Lymphadenopathy:    She has no cervical adenopathy.  Neurological: She is alert.  Skin: Skin is warm and dry. No rash noted. No erythema. No pallor.  Psychiatric: She has a normal mood and affect. Her behavior is normal. Thought content normal.  Nursing note and vitals reviewed.     Assessment & Plan:  1. Diarrhea, unspecified type - Seems to have resolved  after medication. IF she has any more diarrhea I would like to check for c-diff - CBC - H. pylori antibody, IgG - C. difficile, PCR  2. Dysuria -  POCT urinalysis dipstick- Negative for UTI   3. Leukocytosis, unspecified type - No apparent etiology of infection. I am unsure of why she had an elevated white count - CBC  4. Nausea without vomiting - Unknown cause at this time - ondansetron (ZOFRAN) 4 MG tablet; Take 1 tablet (4 mg total) by mouth every 8 (eight) hours as needed for nausea or vomiting.  Dispense: 20 tablet; Refill: 0  Dorothyann Peng, NP

## 2016-03-19 NOTE — Patient Instructions (Signed)
It was great meeting you today  I have sent in a prescription for Zofran, this will help with your nausea  I will follow up with you on your la tests

## 2016-03-20 DIAGNOSIS — F0390 Unspecified dementia without behavioral disturbance: Secondary | ICD-10-CM | POA: Diagnosis not present

## 2016-03-20 DIAGNOSIS — R55 Syncope and collapse: Secondary | ICD-10-CM | POA: Diagnosis not present

## 2016-03-20 DIAGNOSIS — N390 Urinary tract infection, site not specified: Secondary | ICD-10-CM | POA: Diagnosis not present

## 2016-03-20 DIAGNOSIS — J8410 Pulmonary fibrosis, unspecified: Secondary | ICD-10-CM | POA: Diagnosis not present

## 2016-03-20 DIAGNOSIS — J439 Emphysema, unspecified: Secondary | ICD-10-CM | POA: Diagnosis not present

## 2016-03-20 DIAGNOSIS — I2510 Atherosclerotic heart disease of native coronary artery without angina pectoris: Secondary | ICD-10-CM | POA: Diagnosis not present

## 2016-03-21 DIAGNOSIS — I2510 Atherosclerotic heart disease of native coronary artery without angina pectoris: Secondary | ICD-10-CM | POA: Diagnosis not present

## 2016-03-21 DIAGNOSIS — J439 Emphysema, unspecified: Secondary | ICD-10-CM | POA: Diagnosis not present

## 2016-03-21 DIAGNOSIS — J8410 Pulmonary fibrosis, unspecified: Secondary | ICD-10-CM | POA: Diagnosis not present

## 2016-03-21 DIAGNOSIS — F0390 Unspecified dementia without behavioral disturbance: Secondary | ICD-10-CM | POA: Diagnosis not present

## 2016-03-21 DIAGNOSIS — N390 Urinary tract infection, site not specified: Secondary | ICD-10-CM | POA: Diagnosis not present

## 2016-03-21 DIAGNOSIS — R55 Syncope and collapse: Secondary | ICD-10-CM | POA: Diagnosis not present

## 2016-03-24 DIAGNOSIS — J8410 Pulmonary fibrosis, unspecified: Secondary | ICD-10-CM | POA: Diagnosis not present

## 2016-03-24 DIAGNOSIS — F0390 Unspecified dementia without behavioral disturbance: Secondary | ICD-10-CM | POA: Diagnosis not present

## 2016-03-24 DIAGNOSIS — J439 Emphysema, unspecified: Secondary | ICD-10-CM | POA: Diagnosis not present

## 2016-03-24 DIAGNOSIS — I2510 Atherosclerotic heart disease of native coronary artery without angina pectoris: Secondary | ICD-10-CM | POA: Diagnosis not present

## 2016-03-24 DIAGNOSIS — N390 Urinary tract infection, site not specified: Secondary | ICD-10-CM | POA: Diagnosis not present

## 2016-03-24 DIAGNOSIS — R55 Syncope and collapse: Secondary | ICD-10-CM | POA: Diagnosis not present

## 2016-03-25 ENCOUNTER — Ambulatory Visit (INDEPENDENT_AMBULATORY_CARE_PROVIDER_SITE_OTHER): Payer: Medicare Other | Admitting: Family Medicine

## 2016-03-25 ENCOUNTER — Encounter: Payer: Self-pay | Admitting: Family Medicine

## 2016-03-25 VITALS — BP 120/82 | HR 65 | Temp 97.9°F | Wt 118.6 lb

## 2016-03-25 DIAGNOSIS — R309 Painful micturition, unspecified: Secondary | ICD-10-CM | POA: Diagnosis not present

## 2016-03-25 DIAGNOSIS — R29898 Other symptoms and signs involving the musculoskeletal system: Secondary | ICD-10-CM

## 2016-03-25 DIAGNOSIS — R8290 Unspecified abnormal findings in urine: Secondary | ICD-10-CM | POA: Diagnosis not present

## 2016-03-25 LAB — POC URINALSYSI DIPSTICK (AUTOMATED)
GLUCOSE UA: NEGATIVE
NITRITE UA: NEGATIVE
Spec Grav, UA: >=1.03
Urobilinogen, UA: 1
pH, UA: 5.5

## 2016-03-25 MED ORDER — CEPHALEXIN 500 MG PO CAPS: 500.0000 mg | ORAL_CAPSULE | Freq: Two times a day (BID) | ORAL | 0 refills | Status: DC

## 2016-03-25 NOTE — Progress Notes (Signed)
Subjective:  Alfonsina Cintora is a 82 y.o. year old very pleasant female patient who presents for/with See problem oriented charting ROS- no fever, chills, nausea, vomiting, cva tenderness.see any ROS included in HPI as well.   Past Medical History-  Patient Active Problem List   Diagnosis Date Noted  . Syncope 02/23/2016    Priority: High  . Alzheimer's dementia 04/12/2014    Priority: High  . History of CVA (cerebrovascular accident) 07/29/2013    Priority: High  . Pulmonary nodules 07/29/2013    Priority: High  . COPD with emphysema Gold B 01/04/2010    Priority: High  . Diastolic dysfunction 0000000    Priority: Medium  . Insomnia 11/21/2013    Priority: Medium  . Recurrent UTI 07/13/2013    Priority: Medium  . History of breast cancer, T1b, N0, Lumpectomy 07/21/2007. 04/21/2011    Priority: Medium  . Essential hypertension 11/12/2006    Priority: Medium  . Allergic rhinitis 04/12/2014    Priority: Low  . Constipation 04/12/2014    Priority: Low  . Osteoarthritis, knee 04/12/2014    Priority: Low  . Raynaud's syndrome 04/25/2009    Priority: Low  . GERD 04/19/2007    Priority: Low  . Abdominal aortic atherosclerosis ([place]) 03/08/2016    Medications- reviewed and updated Current Outpatient Prescriptions  Medication Sig Dispense Refill  . albuterol (PROVENTIL HFA;VENTOLIN HFA) 108 (90 Base) MCG/ACT inhaler Inhale 2 puffs into the lungs every 6 (six) hours as needed for wheezing or shortness of breath. 1 Inhaler 0  . aspirin 325 MG tablet Take 325 mg by mouth at bedtime.     . budesonide-formoterol (SYMBICORT) 160-4.5 MCG/ACT inhaler Inhale 2 puffs into the lungs 2 (two) times daily. 1 Inhaler 12  . diazepam (VALIUM) 5 MG tablet TAKE 1 TABLET BY MOUTH AT BEDTIME (Patient taking differently: TAKE 1 MG BY MOUTH AT BEDTIME) 30 tablet 2  . donepezil (ARICEPT) 10 MG tablet Take 1/2 tablet daily for 1 month, then increase to 1 tablet daily (Patient taking differently: Take 10 mg  by mouth at bedtime. ) 30 tablet 11  . fluticasone (FLONASE) 50 MCG/ACT nasal spray Place 2 sprays into both nostrils daily. (Patient taking differently: Place 2 sprays into both nostrils at bedtime. )    . nitroGLYCERIN (NITROSTAT) 0.4 MG SL tablet Place 1 tablet (0.4 mg total) under the tongue every 5 (five) minutes as needed for chest pain (take as directed). 25 tablet 3  . ondansetron (ZOFRAN) 4 MG tablet Take 1 tablet (4 mg total) by mouth every 8 (eight) hours as needed for nausea or vomiting. 20 tablet 0  . cephALEXin (KEFLEX) 500 MG capsule Take 1 capsule (500 mg total) by mouth every 12 (twelve) hours. 14 capsule 0   No current facility-administered medications for this visit.     Objective: BP 120/82 (BP Location: Left Arm, Patient Position: Sitting, Cuff Size: Normal)   Pulse 65   Temp 97.9 F (36.6 C) (Oral)   Wt 118 lb 9.6 oz (53.8 kg)   SpO2 94%   BMI 21.69 kg/m  Gen: NAD, resting comfortably CV: RRR no murmurs rubs or gallops Lungs: CTAB no crackles, wheeze, rhonchi Abdomen: soft/mild suprapubic and LLQ pain/nondistended/normal bowel sounds. No rebound or guarding.  Ext: no edema Neuro: hard of hearing  Assessment/Plan:  Pain with urination - Plan: Urine culture,  POCT Urinalysis Dipstick S: UA concerning for UTI- last culture did not grow out true UTi but symptoms resolved on keflex. She is  having burning with urination and suprapubic/LLQ pain. Some mild constipation- has not used her miralax- but had diarrhea last week from using a different laxative.  A/P: likely UTI based on UA- treat with keflex (though discussed option of waiting for culture which was declined). Last time did 6 day course- will do 7 day course at this point  Leg weakness S: continued weakness in legs after recent hospitalization though is finally walking to the dining hall. [facility] PT # of visits is now up- but has opportunity to do PT at [facility]: will refer to PT at [facility] for  continuing work with mobility  Very pleased patient's mobility has improved- hopeful for continued improvement  Orders Placed This Encounter  Procedures  . Urine culture    [facility]  . Ambulatory referral to Physical Therapy    Referral Priority:   Routine    Referral Type:   Physical Medicine    Referral Reason:   Specialty Services Required    Requested Specialty:   Physical Therapy    Number of Visits Requested:   1  . POCT Urinalysis Dipstick (Automated)    Meds ordered this encounter  Medications  . cephALEXin (KEFLEX) 500 MG capsule    Sig: Take 1 capsule (500 mg total) by mouth every 12 (twelve) hours.    Dispense:  14 capsule    Refill:  0    Return precautions advised.  Garret Reddish, MD

## 2016-03-25 NOTE — Patient Instructions (Signed)
Refer to PT at [facility] for leg weakness  Start UTI treatment with keflex- 7 days this time. Get urine culture- we may stop if no true infectoin.

## 2016-03-25 NOTE — Progress Notes (Signed)
Pre visit review using our clinic review tool, if applicable. No additional management support is needed unless otherwise documented below in the visit note. 

## 2016-03-27 ENCOUNTER — Telehealth: Payer: Self-pay | Admitting: Family Medicine

## 2016-03-27 DIAGNOSIS — J439 Emphysema, unspecified: Secondary | ICD-10-CM | POA: Diagnosis not present

## 2016-03-27 DIAGNOSIS — J8410 Pulmonary fibrosis, unspecified: Secondary | ICD-10-CM | POA: Diagnosis not present

## 2016-03-27 DIAGNOSIS — I2510 Atherosclerotic heart disease of native coronary artery without angina pectoris: Secondary | ICD-10-CM | POA: Diagnosis not present

## 2016-03-27 DIAGNOSIS — N390 Urinary tract infection, site not specified: Secondary | ICD-10-CM | POA: Diagnosis not present

## 2016-03-27 DIAGNOSIS — F0390 Unspecified dementia without behavioral disturbance: Secondary | ICD-10-CM | POA: Diagnosis not present

## 2016-03-27 DIAGNOSIS — R55 Syncope and collapse: Secondary | ICD-10-CM | POA: Diagnosis not present

## 2016-03-27 LAB — URINE CULTURE: Colony Count: >=100000

## 2016-03-27 NOTE — Telephone Encounter (Addendum)
Bethany from  [employer] needs clarification or orders they received from [place].

## 2016-03-31 NOTE — Telephone Encounter (Signed)
Called and left a voicemail message asking for a return phone call 

## 2016-04-01 NOTE — Telephone Encounter (Signed)
Spoke with [place] and clarified some of the orders regarding PT. She will be sending over written order clarification.

## 2016-04-04 DIAGNOSIS — M6281 Muscle weakness (generalized): Secondary | ICD-10-CM | POA: Diagnosis not present

## 2016-04-04 DIAGNOSIS — R2681 Unsteadiness on feet: Secondary | ICD-10-CM | POA: Diagnosis not present

## 2016-04-04 DIAGNOSIS — R29898 Other symptoms and signs involving the musculoskeletal system: Secondary | ICD-10-CM | POA: Diagnosis not present

## 2016-04-04 DIAGNOSIS — R260 Ataxic gait: Secondary | ICD-10-CM | POA: Diagnosis not present

## 2016-04-04 DIAGNOSIS — R2689 Other abnormalities of gait and mobility: Secondary | ICD-10-CM | POA: Diagnosis not present

## 2016-04-09 ENCOUNTER — Other Ambulatory Visit: Payer: Self-pay | Admitting: Family Medicine

## 2016-04-09 DIAGNOSIS — R260 Ataxic gait: Secondary | ICD-10-CM | POA: Diagnosis not present

## 2016-04-09 DIAGNOSIS — M6281 Muscle weakness (generalized): Secondary | ICD-10-CM | POA: Diagnosis not present

## 2016-04-09 DIAGNOSIS — R29898 Other symptoms and signs involving the musculoskeletal system: Secondary | ICD-10-CM | POA: Diagnosis not present

## 2016-04-09 DIAGNOSIS — R2681 Unsteadiness on feet: Secondary | ICD-10-CM | POA: Diagnosis not present

## 2016-04-09 DIAGNOSIS — R2689 Other abnormalities of gait and mobility: Secondary | ICD-10-CM | POA: Diagnosis not present

## 2016-04-10 ENCOUNTER — Ambulatory Visit (INDEPENDENT_AMBULATORY_CARE_PROVIDER_SITE_OTHER): Payer: Medicare Other | Admitting: Family Medicine

## 2016-04-10 ENCOUNTER — Encounter: Payer: Self-pay | Admitting: Family Medicine

## 2016-04-10 VITALS — BP 116/84 | HR 72 | Temp 98.4°F | Wt 116.4 lb

## 2016-04-10 DIAGNOSIS — N3090 Cystitis, unspecified without hematuria: Secondary | ICD-10-CM

## 2016-04-10 LAB — POC URINALSYSI DIPSTICK (AUTOMATED)
BILIRUBIN UA: NEGATIVE
Blood, UA: NEGATIVE
GLUCOSE UA: NEGATIVE
Nitrite, UA: NEGATIVE
PROTEIN UA: NEGATIVE
Spec Grav, UA: 1.015
Urobilinogen, UA: 1
pH, UA: 6

## 2016-04-10 MED ORDER — CEPHALEXIN 500 MG PO CAPS: 500.0000 mg | ORAL_CAPSULE | Freq: Two times a day (BID) | ORAL | 0 refills | Status: DC

## 2016-04-10 NOTE — Patient Instructions (Signed)
Please take medication as directed and follow up with Dr. Yong Channel if symptoms do not improve in 2 to 3 days, worsen, back pain, nausea, vomiting,or you develop a fever >100.   Urinary Tract Infection, Adult A urinary tract infection (UTI) is an infection of any part of the urinary tract, which includes the kidneys, ureters, bladder, and urethra. These organs make, store, and get rid of urine in the body. UTI can be a bladder infection (cystitis) or kidney infection (pyelonephritis). What are the causes? This infection may be caused by fungi, viruses, or bacteria. Bacteria are the most common cause of UTIs. This condition can also be caused by repeated incomplete emptying of the bladder during urination. What increases the risk? This condition is more likely to develop if:  You ignore your need to urinate or hold urine for long periods of time.  You do not empty your bladder completely during urination.  You wipe back to front after urinating or having a bowel movement, if you are female.  You are uncircumcised, if you are female.  You are constipated.  You have a urinary catheter that stays in place (indwelling).  You have a weak defense (immune) system.  You have a medical condition that affects your bowels, kidneys, or bladder.  You have diabetes.  You take antibiotic medicines frequently or for long periods of time, and the antibiotics no longer work well against certain types of infections (antibiotic resistance).  You take medicines that irritate your urinary tract.  You are exposed to chemicals that irritate your urinary tract.  You are female. What are the signs or symptoms? Symptoms of this condition include:  Fever.  Frequent urination or passing small amounts of urine frequently.  Needing to urinate urgently.  Pain or burning with urination.  Urine that smells bad or unusual.  Cloudy urine.  Pain in the lower abdomen or back.  Trouble urinating.  Blood in  the urine.  Vomiting or being less hungry than normal.  Diarrhea or abdominal pain.  Vaginal discharge, if you are female. How is this diagnosed? This condition is diagnosed with a medical history and physical exam. You will also need to provide a urine sample to test your urine. Other tests may be done, including:  Blood tests.  Sexually transmitted disease (STD) testing. If you have had more than one UTI, a cystoscopy or imaging studies may be done to determine the cause of the infections. How is this treated? Treatment for this condition often includes a combination of two or more of the following:  Antibiotic medicine.  Other medicines to treat less common causes of UTI.  Over-the-counter medicines to treat pain.  Drinking enough water to stay hydrated. Follow these instructions at home:  Take over-the-counter and prescription medicines only as told by your health care provider.  If you were prescribed an antibiotic, take it as told by your health care provider. Do not stop taking the antibiotic even if you start to feel better.  Avoid alcohol, caffeine, tea, and carbonated beverages. They can irritate your bladder.  Drink enough fluid to keep your urine clear or pale yellow.  Keep all follow-up visits as told by your health care provider. This is important.  Make sure to:  Empty your bladder often and completely. Do not hold urine for long periods of time.  Empty your bladder before and after sex.  Wipe from front to back after a bowel movement if you are female. Use each tissue one time when  you wipe. Contact a health care provider if:  You have back pain.  You have a fever.  You feel nauseous or vomit.  Your symptoms do not get better after 3 days.  Your symptoms go away and then return. Get help right away if:  You have severe back pain or lower abdominal pain.  You are vomiting and cannot keep down any medicines or water. This information is not  intended to replace advice given to you by your health care provider. Make sure you discuss any questions you have with your health care provider. Document Released: 02/05/2005 Document Revised: 10/10/2015 Document Reviewed: 03/19/2015 Elsevier Interactive Patient Education  2017 [employer].

## 2016-04-10 NOTE — Telephone Encounter (Signed)
Called and left a voicemail message for daughter Tye [place] so that patient can be brought in. Awaiting return phone call

## 2016-04-10 NOTE — Progress Notes (Signed)
Pre visit review using our clinic review tool, if applicable. No additional management support is needed unless otherwise documented below in the visit note. 

## 2016-04-10 NOTE — Progress Notes (Signed)
Subjective:    Patient ID: [name] [name], female    DOB: [birth year], 82 y.o.   MRN: [id number]  HPI  [name] [name] is a 82 year old female who presents today with urinary frequency for 2 days. Associated symptoms of burning, urgency, and suprapubic pain are present. She denies fever, chills, sweats, flank pain, N/V/D, or hematuria.  Two weeks ago she was evaluated and treated for similar symptoms. She was treated with a 7 day course of keflex however her daughter reports that she missed some doses which prolonged completion of her antibiotic that she finished 2 days ago. No additional treatments have been tried at home.   Review of Systems  Constitutional: Negative for chills, fatigue and fever.  Respiratory: Negative for cough and wheezing.   Cardiovascular: Negative for chest pain and palpitations.  Gastrointestinal: Negative for abdominal pain, constipation, diarrhea, nausea and vomiting.  Genitourinary: Positive for dysuria, frequency and urgency. Negative for flank pain and hematuria.   Past Medical History:  Diagnosis Date  . Airway obstruction   . Allergic rhinitis   . Anal stricture   . ANXIETY 08/16/2008  . Atrial fibrillation ([place])    she denies known hx of afib 02/24/13  . CAD, UNSPECIFIED SITE 10/17/2008  . Candida esophagitis ([place])   . CHOLELITHIASIS 08/23/2008  . Chronic constipation   . Colitis    sigmoid  . Complication of anesthesia    difficult intubation  . COPD 01/04/2010   FeV1 101%, DLCO64% 2008  . COPD (chronic obstructive pulmonary disease) ([place]) 01/04/2010  . 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 Diverticulitis   . DIVERTICULOSIS-COLON 04/07/2008  . Emphysema of lung ([place])   . Esophageal stricture   . Fatty liver   . GERD 04/19/2007  . Hearing aid worn    bilateral  . Hiatal hernia   . History of diverticulitis of colon   . HYPERSOMNIA, ASSOCIATED WITH SLEEP APNEA 02/01/2007  . HYPERTENSION 11/12/2006  . IBS (irritable bowel syndrome)   . MITRAL  VALVE PROLAPSE, HX OF 10/07/2008  . NEOPLASM, MALIGNANT, BREAST, RIGHT 02/15/2008  . OSTEOPOROSIS 04/04/2008  . Pulmonary fibrosis ([place])   . Raynaud's syndrome 04/25/2009  . Sleep apnea   . Tortuous colon      Social History   Social History  . Marital status: Widowed    Spouse name: N/A  . Number of children: 3  . Years of education: N/A   Occupational History  . retireed Retired   Social History Main Topics  . Smoking status: Former Smoker    Packs/day: 2.00    Years: 30.00    Types: Cigarettes    Quit date: 05/13/1983  . Smokeless tobacco: Never Used  . Alcohol use 0.0 oz/week     Comment: 1-2 glaases wine per day  . Drug use: No  . Sexual activity: No   Other Topics Concern  . Not on file   Social History Narrative   Living in assisted living at [facility]. Widowed 1991. 3 children. 7 grandkids. No greatgrandkids.       Walks down for her meals with a walker.    ADL-bath, clothe, cleaning   IADL-daughter does finances      HCPOA- daughter Janan [place]   Full code for now- have counseled and advised consider DNR/DNI   States enjoys living and would want resuscitation currently      [facility]:   Patient is originally from [place]. Has traveled to nearly ever one of the [place]  states. She has prior travel to [place] & the [place]. Previously enjoyed gardening. 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 Remote bird exposure to a canary as a child. Previously had bird houses in her yard that she cleaned out.     Past Surgical History:  Procedure Laterality Date  . ABDOMINAL HYSTERECTOMY    . APPENDECTOMY    . BILATERAL SALPINGOOPHORECTOMY     s/p prolapsed bladder  . BLADDER SURGERY     prolapsed  . BREAST CYST EXCISION     left  . BREAST SURGERY  07-21-07   mastectomy (right), all margins neg and LNs neg   . EYE SURGERY     cataract removal bilateral  . LUNG SURGERY    . MASTECTOMY     right  . MELANOMA EXCISION     rt  clavicle Dr Rhoderick [facility] office)  . MOUTH SURGERY    . ROTATOR CUFF REPAIR    . THORACOTOMY     granuloma, pulmonary-thoracotomy  . TONSILLECTOMY      Family History  Problem Relation Age of Onset  . Parkinsonism Father   . Coronary artery disease Father   . Stroke Mother   . Heart failure Mother   . Breast cancer      aunt  . Colon cancer      aunt, age 60  . Uterine cancer      aunt  . Breast cancer Maternal Aunt   . Colon cancer Maternal Aunt   . Uterine cancer Maternal Aunt   . Lung disease Neg Hx     Allergies  Allergen Reactions  . Azithromycin Hives, Diarrhea, Dermatitis and Rash  . Ciprofloxacin Swelling  . Levaquin [Levofloxacin] Hives  . Penicillins Hives    has had mild doses that she did not have reactions to  Has patient had a PCN reaction causing immediate rash, facial/tongue/throat swelling, SOB or lightheadedness with hypotension:Yes Has patient had a PCN reaction causing severe rash involving mucus membranes or skin necrosis: No Has patient had a PCN reaction that required hospitalization: no Has patient had a PCN reaction occurring within the last 10 years: no If all of the above answers are "NO", then may proceed with Cephalosporin use.   . Sulfamethoxazole Nausea And Vomiting    Current Outpatient Prescriptions on File Prior to Visit  Medication Sig Dispense Refill  . albuterol (PROVENTIL HFA;VENTOLIN HFA) 108 (90 Base) MCG/ACT inhaler Inhale 2 puffs into the lungs every 6 (six) hours as needed for wheezing or shortness of breath. 1 Inhaler 0  . aspirin 325 MG tablet Take 325 mg by mouth at bedtime.     . budesonide-formoterol (SYMBICORT) 160-4.5 MCG/ACT inhaler Inhale 2 puffs into the lungs 2 (two) times daily. 1 Inhaler 12  . diazepam (VALIUM) 5 MG tablet TAKE 1 TABLET BY MOUTH AT BEDTIME (Patient taking differently: TAKE 1 MG BY MOUTH AT BEDTIME) 30 tablet 2  . donepezil (ARICEPT) 10 MG tablet Take 1/2 tablet daily for 1 month, then increase to 1  tablet daily (Patient taking differently: Take 10 mg by mouth at bedtime. ) 30 tablet 11  . fluticasone (FLONASE) 50 MCG/ACT nasal spray Place 2 sprays into both nostrils daily. (Patient taking differently: Place 2 sprays into both nostrils at bedtime. )    . nitroGLYCERIN (NITROSTAT) 0.4 MG SL tablet Place 1 tablet (0.4 mg total) under the tongue every 5 (five) minutes as needed for chest pain (take as directed). 25  tablet 3  . ondansetron (ZOFRAN) 4 MG tablet Take 1 tablet (4 mg total) by mouth every 8 (eight) hours as needed for nausea or vomiting. 20 tablet 0   No current facility-administered medications on file prior to visit.     BP 116/84 (BP Location: Left Arm, Patient Position: Sitting, Cuff Size: Normal)   Pulse 72   Temp 98.4 F (36.9 C) (Oral)   Wt 116 lb 6.4 oz (52.8 kg)   SpO2 96%   BMI 21.29 kg/m        Objective:   Physical Exam  Constitutional: She is oriented to person, place, and time.  Thin, frail, optimally nourished female ambulating with a walker  Eyes: Pupils are equal, round, and reactive to light. No scleral icterus.  Neck: Neck supple.  Cardiovascular: Normal rate and regular rhythm.   Pulmonary/Chest: Effort normal and breath sounds normal. She has no wheezes. She has no rales.  Abdominal: Soft. Bowel sounds are normal. There is no tenderness. There is no CVA tenderness.  Suprapubic tenderness present  Lymphadenopathy:    She has no cervical adenopathy.  Neurological: She is alert and oriented to person, place, and time.  Skin: Skin is warm and dry. No rash noted.        Assessment & Plan:  1. Cystitis POC UA 2+Leukocytes; will send for culture; suspect that UTI may have not cleared due to patient not taking antibiotic consistently. Will treat with Keflex and advised increased water intake and follow up with Dr. Yong Channel if symptoms do not improve in 2 to 3 days, worsen, or she develops back pain, or fever >100. Patient and daughter voiced  understanding and agreed with plan. - cephALEXin (KEFLEX) 500 MG capsule; Take 1 capsule (500 mg total) by mouth 2 (two) times daily.  Dispense: 14 capsule; Refill: 0 - POCT Urinalysis Dipstick (Automated) - Urine culture  Delano Metz, FNP-C

## 2016-04-10 NOTE — Telephone Encounter (Signed)
Can we work her in today with Almyra Free? I would not refill without culture

## 2016-04-11 DIAGNOSIS — N393 Stress incontinence (female) (male): Secondary | ICD-10-CM | POA: Diagnosis not present

## 2016-04-11 DIAGNOSIS — M6281 Muscle weakness (generalized): Secondary | ICD-10-CM | POA: Diagnosis not present

## 2016-04-11 DIAGNOSIS — R2681 Unsteadiness on feet: Secondary | ICD-10-CM | POA: Diagnosis not present

## 2016-04-11 DIAGNOSIS — R29898 Other symptoms and signs involving the musculoskeletal system: Secondary | ICD-10-CM | POA: Diagnosis not present

## 2016-04-11 DIAGNOSIS — R2689 Other abnormalities of gait and mobility: Secondary | ICD-10-CM | POA: Diagnosis not present

## 2016-04-11 DIAGNOSIS — R260 Ataxic gait: Secondary | ICD-10-CM | POA: Diagnosis not present

## 2016-04-11 NOTE — Telephone Encounter (Signed)
Pt was seen by Almira Coaster, NP on 04/10/2016

## 2016-04-13 ENCOUNTER — Observation Stay (HOSPITAL_COMMUNITY)
Admission: EM | Admit: 2016-04-13 | Discharge: 2016-04-14 | Disposition: A | Discharge status: Home / Self Care | Payer: Medicare Other | Attending: Internal Medicine | Admitting: Internal Medicine

## 2016-04-13 ENCOUNTER — Emergency Department (HOSPITAL_COMMUNITY): Payer: Medicare Other

## 2016-04-13 ENCOUNTER — Encounter (HOSPITAL_COMMUNITY): Payer: Self-pay

## 2016-04-13 DIAGNOSIS — Z8673 Personal history of transient ischemic attack (TIA), and cerebral infarction without residual deficits: Secondary | ICD-10-CM | POA: Insufficient documentation

## 2016-04-13 DIAGNOSIS — R404 Transient alteration of awareness: Secondary | ICD-10-CM | POA: Diagnosis not present

## 2016-04-13 DIAGNOSIS — I10 Essential (primary) hypertension: Secondary | ICD-10-CM | POA: Diagnosis not present

## 2016-04-13 DIAGNOSIS — A419 Sepsis, unspecified organism: Secondary | ICD-10-CM

## 2016-04-13 DIAGNOSIS — I2510 Atherosclerotic heart disease of native coronary artery without angina pectoris: Secondary | ICD-10-CM | POA: Diagnosis not present

## 2016-04-13 DIAGNOSIS — N3000 Acute cystitis without hematuria: Secondary | ICD-10-CM | POA: Insufficient documentation

## 2016-04-13 DIAGNOSIS — K5732 Diverticulitis of large intestine without perforation or abscess without bleeding: Secondary | ICD-10-CM | POA: Diagnosis not present

## 2016-04-13 DIAGNOSIS — G301 Alzheimer's disease with late onset: Secondary | ICD-10-CM

## 2016-04-13 DIAGNOSIS — J449 Chronic obstructive pulmonary disease, unspecified: Secondary | ICD-10-CM | POA: Insufficient documentation

## 2016-04-13 DIAGNOSIS — G309 Alzheimer's disease, unspecified: Secondary | ICD-10-CM

## 2016-04-13 DIAGNOSIS — I7300 Raynaud's syndrome without gangrene: Secondary | ICD-10-CM | POA: Diagnosis present

## 2016-04-13 DIAGNOSIS — Z853 Personal history of malignant neoplasm of breast: Secondary | ICD-10-CM

## 2016-04-13 DIAGNOSIS — Z87891 Personal history of nicotine dependence: Secondary | ICD-10-CM | POA: Insufficient documentation

## 2016-04-13 DIAGNOSIS — K5792 Diverticulitis of intestine, part unspecified, without perforation or abscess without bleeding: Secondary | ICD-10-CM

## 2016-04-13 DIAGNOSIS — R531 Weakness: Secondary | ICD-10-CM | POA: Diagnosis not present

## 2016-04-13 DIAGNOSIS — J438 Other emphysema: Secondary | ICD-10-CM | POA: Diagnosis not present

## 2016-04-13 DIAGNOSIS — Z7982 Long term (current) use of aspirin: Secondary | ICD-10-CM | POA: Diagnosis not present

## 2016-04-13 DIAGNOSIS — Z79899 Other long term (current) drug therapy: Secondary | ICD-10-CM | POA: Insufficient documentation

## 2016-04-13 DIAGNOSIS — F0280 Dementia in other diseases classified elsewhere without behavioral disturbance: Secondary | ICD-10-CM | POA: Diagnosis present

## 2016-04-13 DIAGNOSIS — N390 Urinary tract infection, site not specified: Secondary | ICD-10-CM

## 2016-04-13 DIAGNOSIS — J439 Emphysema, unspecified: Secondary | ICD-10-CM | POA: Diagnosis present

## 2016-04-13 HISTORY — DX: Urinary tract infection, site not specified: N39.0

## 2016-04-13 LAB — URINE MICROSCOPIC-ADD ON

## 2016-04-13 LAB — COMPREHENSIVE METABOLIC PANEL
ALT: 11 U/L — AB (ref 14–54)
AST: 22 U/L (ref 15–41)
Albumin: 3.7 g/dL (ref 3.5–5.0)
Alkaline Phosphatase: 61 U/L (ref 38–126)
Anion gap: 8 (ref 5–15)
BUN: 12 mg/dL (ref 6–20)
CHLORIDE: 101 mmol/L (ref 101–111)
CO2: 26 mmol/L (ref 22–32)
CREATININE: 0.95 mg/dL (ref 0.44–1.00)
Calcium: 8.5 mg/dL — ABNORMAL LOW (ref 8.9–10.3)
GFR calc Af Amer: 59 mL/min — ABNORMAL LOW (ref >60)
GFR, EST NON AFRICAN AMERICAN: 51 mL/min — AB (ref >60)
Glucose, Bld: 130 mg/dL — ABNORMAL HIGH (ref 65–99)
POTASSIUM: 3.9 mmol/L (ref 3.5–5.1)
SODIUM: 135 mmol/L (ref 135–145)
Total Bilirubin: 1.3 mg/dL — ABNORMAL HIGH (ref 0.3–1.2)
Total Protein: 6.6 g/dL (ref 6.5–8.1)

## 2016-04-13 LAB — I-STAT CHEM 8, ED
BUN: 11 mg/dL (ref 6–20)
CALCIUM ION: 1.11 mmol/L — AB (ref 1.15–1.40)
CHLORIDE: 99 mmol/L — AB (ref 101–111)
CREATININE: 0.9 mg/dL (ref 0.44–1.00)
GLUCOSE: 128 mg/dL — AB (ref 65–99)
HCT: 40 % (ref 36.0–46.0)
Hemoglobin: 13.6 g/dL (ref 12.0–15.0)
Potassium: 3.9 mmol/L (ref 3.5–5.1)
Sodium: 137 mmol/L (ref 135–145)
TCO2: 27 mmol/L (ref 0–100)

## 2016-04-13 LAB — URINALYSIS, ROUTINE W REFLEX MICROSCOPIC
GLUCOSE, UA: NEGATIVE mg/dL
HGB URINE DIPSTICK: NEGATIVE
Ketones, ur: 40 mg/dL — AB
Nitrite: NEGATIVE
PH: 5.5 (ref 5.0–8.0)
Protein, ur: 30 mg/dL — AB
SPECIFIC GRAVITY, URINE: 1.023 (ref 1.005–1.030)

## 2016-04-13 LAB — CBC WITH DIFFERENTIAL/PLATELET
Basophils Absolute: 0 10*3/uL (ref 0.0–0.1)
Basophils Relative: 0 %
Eosinophils Absolute: 0 10*3/uL (ref 0.0–0.7)
Eosinophils Relative: 0 %
HCT: 38.8 % (ref 36.0–46.0)
HEMOGLOBIN: 13 g/dL (ref 12.0–15.0)
LYMPHS ABS: 0.4 10*3/uL — AB (ref 0.7–4.0)
LYMPHS PCT: 3 %
MCH: 28.9 pg (ref 26.0–34.0)
MCHC: 33.5 g/dL (ref 30.0–36.0)
MCV: 86.2 fL (ref 78.0–100.0)
MONOS PCT: 5 %
Monocytes Absolute: 0.7 10*3/uL (ref 0.1–1.0)
NEUTROS PCT: 92 %
Neutro Abs: 13.5 10*3/uL — ABNORMAL HIGH (ref 1.7–7.7)
Platelets: 213 10*3/uL (ref 150–400)
RBC: 4.5 MIL/uL (ref 3.87–5.11)
RDW: 14.7 % (ref 11.5–15.5)
WBC: 14.6 10*3/uL — AB (ref 4.0–10.5)

## 2016-04-13 LAB — URINE CULTURE

## 2016-04-13 LAB — I-STAT TROPONIN, ED: TROPONIN I, POC: 0.01 ng/mL (ref 0.00–0.08)

## 2016-04-13 LAB — I-STAT CG4 LACTIC ACID, ED
LACTIC ACID, VENOUS: 1.3 mmol/L (ref 0.5–1.9)
LACTIC ACID, VENOUS: 1.87 mmol/L (ref 0.5–1.9)

## 2016-04-13 MED ORDER — ONDANSETRON HCL 4 MG PO TABS: 4.0000 mg | ORAL_TABLET | Freq: Four times a day (QID) | ORAL | Status: DC | PRN

## 2016-04-13 MED ORDER — ACETAMINOPHEN 500 MG PO TABS
1000.0000 mg | ORAL_TABLET | Freq: Once | ORAL | Status: AC
  Administered 2016-04-13: 1000 mg via ORAL
  Filled 2016-04-13: qty 2

## 2016-04-13 MED ORDER — ENOXAPARIN SODIUM 40 MG/0.4ML ~~LOC~~ SOLN
40.0000 mg | SUBCUTANEOUS | Status: DC
  Administered 2016-04-13: 40 mg via SUBCUTANEOUS
  Filled 2016-04-13: qty 0.4

## 2016-04-13 MED ORDER — DEXTROSE 5 % IV SOLN
1.0000 g | INTRAVENOUS | Status: DC
  Filled 2016-04-13: qty 10

## 2016-04-13 MED ORDER — BOOST / RESOURCE BREEZE PO LIQD
1.0000 | Freq: Three times a day (TID) | ORAL | Status: DC
  Administered 2016-04-13 – 2016-04-14 (×2): 1 via ORAL

## 2016-04-13 MED ORDER — METRONIDAZOLE IN NACL 5-0.79 MG/ML-% IV SOLN
500.0000 mg | Freq: Once | INTRAVENOUS | Status: AC
  Administered 2016-04-13: 500 mg via INTRAVENOUS
  Filled 2016-04-13: qty 100

## 2016-04-13 MED ORDER — IOPAMIDOL (ISOVUE-300) INJECTION 61%
INTRAVENOUS | Status: AC
  Filled 2016-04-13: qty 30

## 2016-04-13 MED ORDER — ALBUTEROL SULFATE (2.5 MG/3ML) 0.083% IN NEBU: 2.5000 mg | INHALATION_SOLUTION | Freq: Four times a day (QID) | RESPIRATORY_TRACT | Status: DC | PRN

## 2016-04-13 MED ORDER — SODIUM CHLORIDE 0.9 % IV BOLUS (SEPSIS)
1000.0000 mL | Freq: Once | INTRAVENOUS | Status: AC
  Administered 2016-04-13: 1000 mL via INTRAVENOUS

## 2016-04-13 MED ORDER — METRONIDAZOLE IN NACL 5-0.79 MG/ML-% IV SOLN
500.0000 mg | Freq: Three times a day (TID) | INTRAVENOUS | Status: DC
  Administered 2016-04-13: 500 mg via INTRAVENOUS
  Filled 2016-04-13: qty 100

## 2016-04-13 MED ORDER — SODIUM CHLORIDE 0.9 % IV SOLN
INTRAVENOUS | Status: DC
  Administered 2016-04-13: 22:00:00 via INTRAVENOUS

## 2016-04-13 MED ORDER — DIAZEPAM 2 MG PO TABS
1.0000 mg | ORAL_TABLET | Freq: Every day | ORAL | Status: DC
  Administered 2016-04-13: 1 mg via ORAL
  Filled 2016-04-13: qty 1

## 2016-04-13 MED ORDER — DONEPEZIL HCL 10 MG PO TABS
10.0000 mg | ORAL_TABLET | Freq: Every day | ORAL | Status: DC
  Administered 2016-04-13: 10 mg via ORAL
  Filled 2016-04-13: qty 1

## 2016-04-13 MED ORDER — IOPAMIDOL (ISOVUE-300) INJECTION 61%
75.0000 mL | Freq: Once | INTRAVENOUS | Status: AC | PRN
  Administered 2016-04-13: 75 mL via INTRAVENOUS

## 2016-04-13 MED ORDER — IOPAMIDOL (ISOVUE-300) INJECTION 61%
25.0000 mL | Freq: Once | INTRAVENOUS | Status: AC | PRN
  Administered 2016-04-13: 25 mL via ORAL

## 2016-04-13 MED ORDER — FLUTICASONE PROPIONATE 50 MCG/ACT NA SUSP
2.0000 | Freq: Every day | NASAL | Status: DC
  Administered 2016-04-13: 2 via NASAL
  Filled 2016-04-13: qty 16

## 2016-04-13 MED ORDER — ONDANSETRON HCL 4 MG/2ML IJ SOLN: 4.0000 mg | Freq: Four times a day (QID) | INTRAMUSCULAR | Status: DC | PRN

## 2016-04-13 MED ORDER — ORAL CARE MOUTH RINSE
15.0000 mL | Freq: Two times a day (BID) | OROMUCOSAL | Status: DC
  Administered 2016-04-14: 15 mL via OROMUCOSAL

## 2016-04-13 MED ORDER — SODIUM CHLORIDE 0.9 % IJ SOLN
INTRAMUSCULAR | Status: AC
  Filled 2016-04-13: qty 50

## 2016-04-13 MED ORDER — ASPIRIN 325 MG PO TABS
325.0000 mg | ORAL_TABLET | Freq: Every day | ORAL | Status: DC
  Administered 2016-04-13: 325 mg via ORAL
  Filled 2016-04-13: qty 1

## 2016-04-13 MED ORDER — ACETAMINOPHEN 650 MG RE SUPP: 650.0000 mg | Freq: Four times a day (QID) | RECTAL | Status: DC | PRN

## 2016-04-13 MED ORDER — MOMETASONE FURO-FORMOTEROL FUM 200-5 MCG/ACT IN AERO
2.0000 | INHALATION_SPRAY | Freq: Two times a day (BID) | RESPIRATORY_TRACT | Status: DC
  Administered 2016-04-13 – 2016-04-14 (×2): 2 via RESPIRATORY_TRACT
  Filled 2016-04-13: qty 8.8

## 2016-04-13 MED ORDER — IOPAMIDOL (ISOVUE-300) INJECTION 61%
INTRAVENOUS | Status: AC
  Filled 2016-04-13: qty 75

## 2016-04-13 MED ORDER — ACETAMINOPHEN 325 MG PO TABS: 650.0000 mg | ORAL_TABLET | Freq: Four times a day (QID) | ORAL | Status: DC | PRN

## 2016-04-13 MED ORDER — DEXTROSE 5 % IV SOLN
1.0000 g | Freq: Once | INTRAVENOUS | Status: AC
  Administered 2016-04-13: 1 g via INTRAVENOUS
  Filled 2016-04-13: qty 10

## 2016-04-13 NOTE — ED Notes (Signed)
BLOOD CULTURE X 1 5ML LEFT AC OBTAINED

## 2016-04-13 NOTE — ED Notes (Signed)
Patient transported to CT 

## 2016-04-13 NOTE — ED Triage Notes (Signed)
Per [facility] Pt resides at [facility]. FULL CODE. Per family pt was normal last night. Pt c/o of feeling cool at bedtime. Pt warm to touch. Tympanic fever 99.9. 02 sats RA 87% 2LNC 94%. Pt c/o of generalized weakness. Unable to perform ADLs Denies N/V/D or cold like symptoms. Family concerned ? UTI. No symptoms however family states she has strong HX.

## 2016-04-13 NOTE — ED Notes (Signed)
ED Provider at bedside. 

## 2016-04-13 NOTE — Progress Notes (Signed)
Pharmacy Antibiotic Note  [name] [name] is a 82 y.o. female admitted on 04/13/2016 with sepsis due to UTI and diverticulitis.  Metronidazole 500mg  IV x 1 given @ 16:35 and Ceftriaxone 1gm IV x 1 given @ 15:23.  Admitting H&P states plans for Ceftriaxone and Metronidazole.  Pharmacy asked to continue dosing of Ceftriaxone and Metronidazole upon admission.   Plan: Ceftriaxone 1gm IV q24h Metronidazole 500mg  IV q8h  Need for further dosage adjustment appears unlikely at present.    Will sign off at this time.  Please reconsult if a change in clinical status warrants re-evaluation of dosage.  Thank you for allowing pharmacy to be a part of this patient's care.  Everette Rank, PharmD 04/13/2016 9:20 PM

## 2016-04-13 NOTE — ED Notes (Signed)
Pt family member states pt has hx of COPD and wears O2 at night 2L.

## 2016-04-13 NOTE — ED Notes (Signed)
Bed: WA08 Expected date:  Expected time:  Means of arrival:  Comments: 82 yo Possible UTI?-fever

## 2016-04-13 NOTE — ED Notes (Addendum)
Daughter Tye [place] came to see pt this am. Pt does wear 02 at night. 86% on 2LNC. Temp 101 oral. Daughter was unable to get pt in car. Pt states she was too weak to get in car. Pt is on Keflex 500mg  BID daily for UTI since Thursday.

## 2016-04-13 NOTE — ED Notes (Signed)
Drinking oral contrast 

## 2016-04-13 NOTE — ED Provider Notes (Signed)
[facility] DEPT Provider Note   CSN: [id number] Arrival date & time: 04/13/16  1232     History   Chief Complaint Chief Complaint  Patient presents with  . Fever    TYMPANIC 99.9  . Weakness    GENERALIZED  . Urinary Tract Infection    HPI [name] [name] is a 82 y.o. female.  82 yo F with a chief complaints of fever and weakness. Patient has been treated for recurrent urinary tract infection over the past 3 weeks. Was resolved diagnosed about 4 days ago and started on Keflex. Last night started having chills throughout the evening. This morning she called her daughter because she was unable to get out of bed. I was unable to get her up and take her to the hospital since called 911. Denture was 101 at home. Having a very mild cough. Denies any other symptoms. Denies diarrhea denies vomiting denies decreased intake.   The history is provided by the patient.  Fever   Associated symptoms include cough. Pertinent negatives include no chest pain, no vomiting, no congestion and no headaches.  Weakness  Primary symptoms include no dizziness. Pertinent negatives include no shortness of breath, no chest pain, no vomiting and no headaches.  Urinary Tract Infection   Associated symptoms include chills. Pertinent negatives include no nausea, no vomiting and no urgency.  Illness  This is a new problem. The current episode started less than 1 hour ago. The problem occurs constantly. The problem has not changed since onset.Pertinent negatives include no chest pain, no headaches and no shortness of breath. Nothing aggravates the symptoms. Nothing relieves the symptoms. She has tried nothing for the symptoms. The treatment provided no relief.    Past Medical History:  Diagnosis Date  . Airway obstruction   . Allergic rhinitis   . Anal stricture   . ANXIETY 08/16/2008  . Atrial fibrillation ([place])    she denies known hx of afib 02/24/13  . CAD, UNSPECIFIED SITE 10/17/2008  . Candida esophagitis  ([place])   . CHOLELITHIASIS 08/23/2008  . Chronic constipation   . Colitis    sigmoid  . Complication of anesthesia    difficult intubation  . COPD 01/04/2010   FeV1 101%, DLCO64% 2008  . COPD (chronic obstructive pulmonary disease) ([place]) 01/04/2010  . 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 Diverticulitis   . DIVERTICULOSIS-COLON 04/07/2008  . Emphysema of lung ([place])   . Esophageal stricture   . Fatty liver   . GERD 04/19/2007  . Hearing aid worn    bilateral  . Hiatal hernia   . History of diverticulitis of colon   . HYPERSOMNIA, ASSOCIATED WITH SLEEP APNEA 02/01/2007  . HYPERTENSION 11/12/2006  . IBS (irritable bowel syndrome)   . MITRAL VALVE PROLAPSE, HX OF 10/07/2008  . NEOPLASM, MALIGNANT, BREAST, RIGHT 02/15/2008  . OSTEOPOROSIS 04/04/2008  . Pulmonary fibrosis ([place])   . Raynaud's syndrome 04/25/2009  . Sleep apnea   . Tortuous colon   . UTI (urinary tract infection)     Patient Active Problem List   Diagnosis Date Noted  . Diastolic dysfunction 0000000  . Abdominal aortic atherosclerosis ([place]) 03/08/2016  . Syncope 02/23/2016  . Allergic rhinitis 04/12/2014  . Constipation 04/12/2014  . Alzheimer's dementia 04/12/2014  . Osteoarthritis, knee 04/12/2014  . Insomnia 11/21/2013  . History of CVA (cerebrovascular accident) 07/29/2013  . Pulmonary nodules 07/29/2013  . Recurrent UTI 07/13/2013  . History of breast cancer, T1b, N0, Lumpectomy 07/21/2007. 04/21/2011  . COPD with  emphysema Gold B 01/04/2010  . Raynaud's syndrome 04/25/2009  . GERD 04/19/2007  . Essential hypertension 11/12/2006    Past Surgical History:  Procedure Laterality Date  . ABDOMINAL HYSTERECTOMY    . APPENDECTOMY    . BILATERAL SALPINGOOPHORECTOMY     s/p prolapsed bladder  . BLADDER SURGERY     prolapsed  . BREAST CYST EXCISION     left  . BREAST SURGERY  07-21-07   mastectomy (right), all margins neg and LNs neg   . EYE SURGERY     cataract removal bilateral  . LUNG  SURGERY    . MASTECTOMY     right  . MELANOMA EXCISION     rt clavicle Dr Rhoderick [facility] office)  . MOUTH SURGERY    . ROTATOR CUFF REPAIR    . THORACOTOMY     granuloma, pulmonary-thoracotomy  . TONSILLECTOMY      OB History    No data available       Home Medications    Prior to Admission medications   Medication Sig Start Date End Date Taking? Authorizing Provider  albuterol (PROVENTIL HFA;VENTOLIN HFA) 108 (90 Base) MCG/ACT inhaler Inhale 2 puffs into the lungs every 6 (six) hours as needed for wheezing or shortness of breath. 11/23/15   Dorothyann Peng, NP  aspirin 325 MG tablet Take 325 mg by mouth at bedtime.     Historical Provider, MD  budesonide-formoterol (SYMBICORT) 160-4.5 MCG/ACT inhaler Inhale 2 puffs into the lungs 2 (two) times daily. 02/19/16   Marin Olp, MD  cephALEXin (KEFLEX) 500 MG capsule Take 1 capsule (500 mg total) by mouth 2 (two) times daily. 04/10/16   Delano Metz, FNP  diazepam (VALIUM) 5 MG tablet TAKE 1 TABLET BY MOUTH AT BEDTIME Patient taking differently: TAKE 1 MG BY MOUTH AT BEDTIME 01/24/16   Marin Olp, MD  donepezil (ARICEPT) 10 MG tablet Take 1/2 tablet daily for 1 month, then increase to 1 tablet daily Patient taking differently: Take 10 mg by mouth at bedtime.  12/07/15   Cameron Sprang, MD  fluticasone (FLONASE) 50 MCG/ACT nasal spray Place 2 sprays into both nostrils daily. Patient taking differently: Place 2 sprays into both nostrils at bedtime.  11/19/15   Delano Metz, FNP  nitroGLYCERIN (NITROSTAT) 0.4 MG SL tablet Place 1 tablet (0.4 mg total) under the tongue every 5 (five) minutes as needed for chest pain (take as directed). 04/12/15   Fay Records, MD  ondansetron (ZOFRAN) 4 MG tablet Take 1 tablet (4 mg total) by mouth every 8 (eight) hours as needed for nausea or vomiting. 03/19/16   Dorothyann Peng, NP    Family History Family History  Problem Relation Age of Onset  . Parkinsonism Father   . Coronary artery disease  Father   . Stroke Mother   . Heart failure Mother   . Breast cancer      aunt  . Colon cancer      aunt, age 24  . Uterine cancer      aunt  . Breast cancer Maternal Aunt   . Colon cancer Maternal Aunt   . Uterine cancer Maternal Aunt   . Lung disease Neg Hx     Social History Social History  Substance Use Topics  . Smoking status: Former Smoker    Packs/day: 2.00    Years: 30.00    Types: Cigarettes    Quit date: 05/13/1983  . Smokeless tobacco: Never Used  . Alcohol use 0.0 oz/week  Comment: 1-2 glaases wine per day     Allergies   Azithromycin; Ciprofloxacin; Levaquin [levofloxacin]; Penicillins; and Sulfamethoxazole   Review of Systems Review of Systems  Constitutional: Positive for chills and fever.  HENT: Negative for congestion and rhinorrhea.   Eyes: Negative for redness and visual disturbance.  Respiratory: Positive for cough. Negative for shortness of breath and wheezing.   Cardiovascular: Negative for chest pain and palpitations.  Gastrointestinal: Negative for nausea and vomiting.  Genitourinary: Negative for dysuria and urgency.  Musculoskeletal: Negative for arthralgias and myalgias.  Skin: Negative for pallor and wound.  Neurological: Positive for weakness. Negative for dizziness and headaches.     Physical Exam Updated Vital Signs BP 117/55   Pulse 73   Temp 100.9 F (38.3 C) (Rectal)   Resp (!) 30   Ht 5\' 2"  (1.575 m)   Wt 116 lb (52.6 kg)   SpO2 (!) 88%   BMI 21.22 kg/m   Physical Exam  Constitutional: She is oriented to person, place, and time. She appears well-developed and well-nourished. No distress.  HENT:  Head: Normocephalic and atraumatic.  Eyes: EOM are normal. Pupils are equal, round, and reactive to light.  Neck: Normal range of motion. Neck supple.  Cardiovascular: Normal rate and regular rhythm.  Exam reveals no gallop and no friction rub.   No murmur heard. Pulmonary/Chest: Effort normal. She has no wheezes. She has  no rales.  Abdominal: Soft. She exhibits no distension and no mass. There is tenderness (diffuse, worst to L side). There is no guarding.  Musculoskeletal: She exhibits no edema or tenderness.  Neurological: She is alert and oriented to person, place, and time.  Skin: Skin is warm and dry. She is not diaphoretic.  Psychiatric: She has a normal mood and affect. Her behavior is normal.  Nursing note and vitals reviewed.    ED Treatments / Results  Labs (all labs ordered are listed, but only abnormal results are displayed) Labs Reviewed  CBC WITH DIFFERENTIAL/PLATELET - Abnormal; Notable for the following:       Result Value   WBC 14.6 (*)    Neutro Abs 13.5 (*)    Lymphs Abs 0.4 (*)    All other components within normal limits  I-STAT CHEM 8, ED - Abnormal; Notable for the following:    Chloride 99 (*)    Glucose, Bld 128 (*)    Calcium, Ion 1.11 (*)    All other components within normal limits  COMPREHENSIVE METABOLIC PANEL  URINALYSIS, ROUTINE W REFLEX MICROSCOPIC (NOT AT [facility])  I-STAT CG4 LACTIC ACID, ED    EKG  EKG Interpretation None       Radiology No results found.  Procedures Procedures (including critical care time)  Medications Ordered in ED Medications  sodium chloride 0.9 % bolus 1,000 mL (not administered)  acetaminophen (TYLENOL) tablet 1,000 mg (not administered)     Initial Impression / Assessment and Plan / ED Course  I have reviewed the triage vital signs and the nursing notes.  Pertinent labs & imaging results that were available during my care of the patient were reviewed by me and considered in my medical decision making (see chart for details).  Clinical Course     82 yo F With a chief complaint of fever and weakness. Patient is being treated for recurrent urinary tract infections. Was so weak she couldn't get out of bed. Will give IV fluids chest x-ray UA basic labs.  Patient was found to have a urinary  tract infection as well as  diverticulitis. Start on antibiotics and admit.   The patients results and plan were reviewed and discussed.   Any x-rays performed were independently reviewed by myself.   Differential diagnosis were considered with the presenting HPI.  Medications  iopamidol (ISOVUE-300) 61 % injection (not administered)  iopamidol (ISOVUE-300) 61 % injection (not administered)  sodium chloride 0.9 % injection (not administered)  albuterol (PROVENTIL) (2.5 MG/3ML) 0.083% nebulizer solution 2.5 mg (not administered)  aspirin tablet 325 mg (not administered)  mometasone-formoterol (DULERA) 200-5 MCG/ACT inhaler 2 puff (2 puffs Inhalation Given 04/13/16 1945)  donepezil (ARICEPT) tablet 10 mg (not administered)  fluticasone (FLONASE) 50 MCG/ACT nasal spray 2 spray (not administered)  diazepam (VALIUM) tablet 1 mg (not administered)  enoxaparin (LOVENOX) injection 40 mg (40 mg Subcutaneous Given 04/13/16 2016)  0.9 %  sodium chloride infusion ( Intravenous Rate/Dose Change 04/13/16 1831)  acetaminophen (TYLENOL) tablet 650 mg (not administered)    Or  acetaminophen (TYLENOL) suppository 650 mg (not administered)  ondansetron (ZOFRAN) tablet 4 mg (not administered)    Or  ondansetron (ZOFRAN) injection 4 mg (not administered)  MEDLINE mouth rinse (not administered)  feeding supplement (BOOST / RESOURCE BREEZE) liquid 1 Container (1 Container Oral Given 04/13/16 2016)  sodium chloride 0.9 % bolus 1,000 mL (1,000 mLs Intravenous Transfusing/Transfer 04/13/16 1703)  acetaminophen (TYLENOL) tablet 1,000 mg (1,000 mg Oral Given 04/13/16 1455)  iopamidol (ISOVUE-300) 61 % injection 25 mL (25 mLs Oral Contrast Given 04/13/16 0226)  cefTRIAXone (ROCEPHIN) 1 g in dextrose 5 % 50 mL IVPB (0 g Intravenous Stopped 04/13/16 1635)  iopamidol (ISOVUE-300) 61 % injection 75 mL (75 mLs Intravenous Contrast Given 04/13/16 1505)  metroNIDAZOLE (FLAGYL) IVPB 500 mg (500 mg Intravenous Transfusing/Transfer 04/13/16 1704)    Vitals:    04/13/16 1700 04/13/16 1717 04/13/16 1807 04/13/16 1945  BP: 111/55 104/62 (!) 99/52   Pulse: (!) 55 63 62   Resp: 22 21 20    Temp:  97.4 F (36.3 C) 97.7 F (36.5 C)   TempSrc:  Oral Oral   SpO2: 94% 98% 99% 96%  Weight:   115 lb 4.8 oz (52.3 kg)   Height:   5\' 2"  (1.575 m)     Final diagnoses:  Acute cystitis without hematuria  Diverticulitis of large intestine without perforation or abscess without bleeding    Admission/ observation were discussed with the admitting physician, patient and/or family and they are comfortable with the plan.    Final Clinical Impressions(s) / ED Diagnoses   Final diagnoses:  None    New Prescriptions New Prescriptions   No medications on file     Deno Etienne, DO 04/13/16 2025

## 2016-04-13 NOTE — H&P (Signed)
History and Physical    [name] [name] [id number] DOB: 01-06-1924 DOA: 04/13/2016    PCP: [name] Reddish, MD  Patient coming from: independent living  Chief Complaint: weakness and fever  HPI: [name] [name] is a 82 y.o. female with medical history significant of alzheimer's dementia, hard of hearing is brought from independent livign by daughter who found her to be very weak today. She is found to have a fever of 100.9. She was recently treated for a UTI in Oct with Keflex. Her UA was positive but on exam was noted to have abdominal tenderness. She has not complained of abdominal pain, nausea or vomiting.    ED Course: CT abd/pelvis: Mild diverticulitis involving the proximal descending colon Temp 100.9, WBC 14.6  Review of Systems:  All other systems reviewed and apart from HPI, are negative.  Past Medical History:  Diagnosis Date  . Airway obstruction   . Allergic rhinitis   . Anal stricture   . ANXIETY 08/16/2008  . Atrial fibrillation ([place])    she denies known hx of afib 02/24/13  . CAD, UNSPECIFIED SITE 10/17/2008  . Candida esophagitis ([place])   . CHOLELITHIASIS 08/23/2008  . Chronic constipation   . Colitis    sigmoid  . Complication of anesthesia    difficult intubation  . COPD 01/04/2010   FeV1 101%, DLCO64% 2008  . COPD (chronic obstructive pulmonary disease) ([place]) 01/04/2010  . 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 Diverticulitis   . DIVERTICULOSIS-COLON 04/07/2008  . Emphysema of lung ([place])   . Esophageal stricture   . Fatty liver   . GERD 04/19/2007  . Hearing aid worn    bilateral  . Hiatal hernia   . History of diverticulitis of colon   . HYPERSOMNIA, ASSOCIATED WITH SLEEP APNEA 02/01/2007  . HYPERTENSION 11/12/2006  . IBS (irritable bowel syndrome)   . MITRAL VALVE PROLAPSE, HX OF 10/07/2008  . NEOPLASM, MALIGNANT, BREAST, RIGHT 02/15/2008  . OSTEOPOROSIS 04/04/2008  . Pulmonary fibrosis ([place])   . Raynaud's syndrome 04/25/2009  . Sleep apnea   .  Tortuous colon   . UTI (urinary tract infection)     Past Surgical History:  Procedure Laterality Date  . ABDOMINAL HYSTERECTOMY    . APPENDECTOMY    . BILATERAL SALPINGOOPHORECTOMY     s/p prolapsed bladder  . BLADDER SURGERY     prolapsed  . BREAST CYST EXCISION     left  . BREAST SURGERY  07-21-07   mastectomy (right), all margins neg and LNs neg   . EYE SURGERY     cataract removal bilateral  . LUNG SURGERY    . MASTECTOMY     right  . MELANOMA EXCISION     rt clavicle Dr [name] [name] office)  . MOUTH SURGERY    . ROTATOR CUFF REPAIR    . THORACOTOMY     granuloma, pulmonary-thoracotomy  . TONSILLECTOMY      Social History:   reports that she quit smoking about 32 years ago. Her smoking use included Cigarettes. She has a 60.00 pack-year smoking history. She has never used smokeless tobacco. She reports that she drinks alcohol. She reports that she does not use drugs.  Allergies  Allergen Reactions  . Azithromycin Hives, Diarrhea, Dermatitis and Rash  . Ciprofloxacin Swelling  . Levaquin [Levofloxacin] Hives  . Penicillins Hives    has had mild doses that she did not have reactions to  Has patient had a PCN reaction causing immediate rash, facial/tongue/throat swelling, SOB or  lightheadedness with hypotension:Yes Has patient had a PCN reaction causing severe rash involving mucus membranes or skin necrosis: No Has patient had a PCN reaction that required hospitalization: no Has patient had a PCN reaction occurring within the last 10 years: no If all of the above answers are "NO", then may proceed with Cephalosporin use.   . Sulfamethoxazole Nausea And Vomiting    Family History  Problem Relation Age of Onset  . Parkinsonism Father   . Coronary artery disease Father   . Stroke Mother   . Heart failure Mother   . Breast cancer      aunt  . Colon cancer      aunt, age 76  . Uterine cancer      aunt  . Breast cancer Maternal Aunt   . Colon cancer Maternal Aunt    . Uterine cancer Maternal Aunt   . Lung disease Neg Hx      Prior to Admission medications   Medication Sig Start Date End Date Taking? Authorizing Provider  albuterol (PROVENTIL HFA;VENTOLIN HFA) 108 (90 Base) MCG/ACT inhaler Inhale 2 puffs into the lungs every 6 (six) hours as needed for wheezing or shortness of breath. 11/23/15  Yes [name] Peng, NP  aspirin 325 MG tablet Take 325 mg by mouth at bedtime.    Yes Historical Provider, MD  budesonide-formoterol (SYMBICORT) 160-4.5 MCG/ACT inhaler Inhale 2 puffs into the lungs 2 (two) times daily. 02/19/16  Yes [name] Olp, MD  cephALEXin (KEFLEX) 500 MG capsule Take 1 capsule (500 mg total) by mouth 2 (two) times daily. 04/10/16  Yes [name] Metz, FNP  diazepam (VALIUM) 5 MG tablet TAKE 1 TABLET BY MOUTH AT BEDTIME Patient taking differently: TAKE 1 MG BY MOUTH AT BEDTIME 01/24/16  Yes [name] Olp, MD  donepezil (ARICEPT) 10 MG tablet Take 1/2 tablet daily for 1 month, then increase to 1 tablet daily Patient taking differently: Take 10 mg by mouth at bedtime.  12/07/15  Yes [name] Sprang, MD  fluticasone [facility]) 50 MCG/ACT nasal spray Place 2 sprays into both nostrils daily. Patient taking differently: Place 2 sprays into both nostrils at bedtime.  11/19/15  Yes [name] Metz, FNP  nitroGLYCERIN (NITROSTAT) 0.4 MG SL tablet Place 1 tablet (0.4 mg total) under the tongue every 5 (five) minutes as needed for chest pain (take as directed). 04/12/15  Yes [name] Records, MD  ondansetron (ZOFRAN) 4 MG tablet Take 1 tablet (4 mg total) by mouth every 8 (eight) hours as needed for nausea or vomiting. 03/19/16  Yes [name] Peng, NP    Physical Exam: Vitals:   04/13/16 1545 04/13/16 1624 04/13/16 1630 04/13/16 1700  BP: 126/62 116/62 107/57 111/55  Pulse: 63 (!) 58 60 (!) 55  Resp: 22 24 22 22   Temp:      TempSrc:      SpO2: 97% 97% 96% 94%  Weight:      Height:          Constitutional: NAD, calm, comfortable Eyes: PERTLA,  lids and conjunctivae normal ENMT: Mucous membranes are moist. Posterior pharynx clear of any exudate or lesions. Normal dentition.  Neck: normal, supple, no masses, no thyromegaly Respiratory: clear to auscultation bilaterally, no wheezing, no crackles. Normal respiratory effort. No accessory muscle use.  Cardiovascular: S1 & S2 heard, regular rate and rhythm, no murmurs / rubs / gallops. No extremity edema. 2+ pedal pulses. No carotid bruits.  Abdomen: No distension, tenderness in LLQ , no masses palpated. No hepatosplenomegaly.  Bowel sounds normal.  Musculoskeletal: no clubbing / cyanosis. No joint deformity upper and lower extremities. Good ROM, no contractures. Normal muscle tone.  Skin: no rashes, lesions, ulcers. No induration Neurologic: CN 2-12 grossly intact. Sensation intact, DTR normal. Strength 5/5 in all 4 limbs.  Psychiatric: Normal judgment and insight. Alert and oriented x 3. Normal mood.     Labs on Admission: I have personally reviewed following labs and imaging studies  CBC:  Recent Labs Lab 04/13/16 1311 04/13/16 1334  WBC 14.6*  --   NEUTROABS 13.5*  --   HGB 13.0 13.6  HCT 38.8 40.0  MCV 86.2  --   PLT 213  --    Basic Metabolic Panel:  Recent Labs Lab 04/13/16 1311 04/13/16 1334  NA 135 137  K 3.9 3.9  CL 101 99*  CO2 26  --   GLUCOSE 130* 128*  BUN 12 11  CREATININE 0.95 0.90  CALCIUM 8.5*  --    GFR: Estimated Creatinine Clearance: 32.9 mL/min (by C-G formula based on SCr of 0.9 mg/dL). Liver Function Tests:  Recent Labs Lab 04/13/16 1311  AST 22  ALT 11*  ALKPHOS 61  BILITOT 1.3*  PROT 6.6  ALBUMIN 3.7   No results for input(s): LIPASE, AMYLASE in the last 168 hours. No results for input(s): AMMONIA in the last 168 hours. Coagulation Profile: No results for input(s): INR, PROTIME in the last 168 hours. Cardiac Enzymes: No results for input(s): CKTOTAL, CKMB, CKMBINDEX, TROPONINI in the last 168 hours. BNP (last 3 results) No  results for input(s): PROBNP in the last 8760 hours. HbA1C: No results for input(s): HGBA1C in the last 72 hours. CBG: No results for input(s): GLUCAP in the last 168 hours. Lipid Profile: No results for input(s): CHOL, HDL, LDLCALC, TRIG, CHOLHDL, LDLDIRECT in the last 72 hours. Thyroid Function Tests: No results for input(s): TSH, T4TOTAL, FREET4, T3FREE, THYROIDAB in the last 72 hours. Anemia Panel: No results for input(s): VITAMINB12, FOLATE, FERRITIN, TIBC, IRON, RETICCTPCT in the last 72 hours. Urine analysis:    Component Value Date/Time   COLORURINE ORANGE (A) 04/13/2016 1334   APPEARANCEUR CLOUDY (A) 04/13/2016 1334   LABSPEC 1.023 04/13/2016 1334   PHURINE 5.5 04/13/2016 1334   GLUCOSEU NEGATIVE 04/13/2016 1334   HGBUR NEGATIVE 04/13/2016 1334   HGBUR negative 04/07/2008 1505   BILIRUBINUR MODERATE (A) 04/13/2016 1334   BILIRUBINUR negative 04/10/2016 1718   KETONESUR 40 (A) 04/13/2016 1334   PROTEINUR 30 (A) 04/13/2016 1334   UROBILINOGEN 1.0 04/10/2016 1718   UROBILINOGEN 0.2 07/25/2014 2145   NITRITE NEGATIVE 04/13/2016 1334   LEUKOCYTESUR MODERATE (A) 04/13/2016 1334   Sepsis Labs: @LABRCNTIP (procalcitonin:4,lacticidven:4) ) Recent Results (from the past 240 hour(s))  Urine culture     Status: None   Collection Time: 04/10/16  5:22 PM  Result Value Ref Range Status   Culture ESCHERICHIA COLI  Final   Colony Count Greater than 100,000 CFU/mL  Final   Organism ID, Bacteria ESCHERICHIA COLI  Final      Susceptibility   Escherichia coli -  (no method available)    AMPICILLIN >=32 Resistant     AMOX/CLAVULANIC 16 Intermediate     AMPICILLIN/SULBACTAM >=32 Resistant     PIP/TAZO 8 Sensitive     IMIPENEM <=0.25 Sensitive     CEFAZOLIN <=4 Not Reportable     CEFTRIAXONE <=1 Sensitive     CEFTAZIDIME <=1 Sensitive     CEFEPIME <=1 Sensitive     GENTAMICIN <=1 Sensitive  TOBRAMYCIN <=1 Sensitive     CIPROFLOXACIN 1 Sensitive     LEVOFLOXACIN 1 Sensitive       NITROFURANTOIN <=16 Sensitive     TRIMETH/SULFA* <=20 Sensitive      * NR=NOT REPORTABLE,SEE COMMENTORAL therapy:A cefazolin MIC of <32 predicts susceptibility to the oral agents cefaclor,cefdinir,cefpodoxime,cefprozil,cefuroxime,cephalexin,and loracarbef when used for therapy of uncomplicated UTIs due to E.coli,K.pneumomiae,and P.mirabilis. PARENTERAL therapy: A cefazolinMIC of >8 indicates resistance to parenteralcefazolin. An alternate test method must beperformed to confirm susceptibility to parenteralcefazolin.     Radiological Exams on Admission: Ct Abdomen Pelvis W Contrast  Result Date: 04/13/2016 CLINICAL DATA:  Generalized abdominal pain and fever. EXAM: CT ABDOMEN AND PELVIS WITH CONTRAST TECHNIQUE: Multidetector CT imaging of the abdomen and pelvis was performed using the standard protocol following bolus administration of intravenous contrast. CONTRAST:  34mL ISOVUE-300 IOPAMIDOL (ISOVUE-300) INJECTION 61% COMPARISON:  02/28/2016 FINDINGS: Lower Chest: No acute findings. Hepatobiliary: No masses identified. Tiny calcified gallstones again noted, without evidence of cholecystitis or biliary ductal dilatation. Pancreas:  No mass or inflammatory changes. Spleen: Within normal limits in size and appearance. Adrenals/Urinary Tract: No masses identified. Small renal cysts noted bilaterally. No evidence of hydronephrosis. Unremarkable urinary bladder. Stomach/Bowel: Diffuse colonic diverticulosis noted which is most severe in the sigmoid colon. Mild colonic wall thickening and pericolonic inflammatory changes are seen involving the proximal descending colon and area of diverticular disease, consistent with mild diverticulitis. No evidence of extraluminal air or abscess. Vascular/Lymphatic: No pathologically enlarged lymph nodes. No abdominal aortic aneurysm. Aortic atherosclerosis. Reproductive: Prior hysterectomy noted. Adnexal regions are unremarkable in appearance. Other:  None. Musculoskeletal:   No suspicious bone lesions identified. IMPRESSION: Mild diverticulitis involving the proximal descending colon. No evidence of abscess or other complication. Cholelithiasis.  No radiographic evidence of cholecystitis. Aortic atherosclerosis. Electronically Signed   By: Earle Gell M.D.   On: 04/13/2016 15:49    EKG: Independently reviewed. NSR at 78  Assessment/Plan Principal Problem:   Sepsis  - diverticulitis and UTI - Rocephin and Flagyl- clear liquids  Active Problems:       COPD with emphysema Gold B - PRN nebs    History of breast cancer, T1b, N0, Lumpectomy 07/21/2007.    History of CVA (cerebrovascular accident) - ASA 325 mg    Alzheimer's dementia  - Aricept      DVT prophylaxis: Lovenox Code Status: Full Code Family Communication: daughter  Disposition Plan: admit to med surg Consults called:   Admission status: observation    [place] MD [facility] Pager: [email or web address] Password [email or web address] 7PM-7AM, please contact night-coverage   04/13/2016, 5:11 PM

## 2016-04-14 DIAGNOSIS — K5732 Diverticulitis of large intestine without perforation or abscess without bleeding: Secondary | ICD-10-CM | POA: Diagnosis not present

## 2016-04-14 DIAGNOSIS — A419 Sepsis, unspecified organism: Secondary | ICD-10-CM | POA: Diagnosis not present

## 2016-04-14 DIAGNOSIS — J438 Other emphysema: Secondary | ICD-10-CM | POA: Diagnosis not present

## 2016-04-14 DIAGNOSIS — G301 Alzheimer's disease with late onset: Secondary | ICD-10-CM | POA: Diagnosis not present

## 2016-04-14 LAB — COMPREHENSIVE METABOLIC PANEL
ALBUMIN: 3 g/dL — AB (ref 3.5–5.0)
ALK PHOS: 49 U/L (ref 38–126)
ALT: 9 U/L — AB (ref 14–54)
AST: 15 U/L (ref 15–41)
Anion gap: 7 (ref 5–15)
BILIRUBIN TOTAL: 0.7 mg/dL (ref 0.3–1.2)
BUN: 14 mg/dL (ref 6–20)
CALCIUM: 7.6 mg/dL — AB (ref 8.9–10.3)
CO2: 25 mmol/L (ref 22–32)
CREATININE: 0.83 mg/dL (ref 0.44–1.00)
Chloride: 104 mmol/L (ref 101–111)
GFR calc Af Amer: >60 mL/min (ref >60)
GLUCOSE: 105 mg/dL — AB (ref 65–99)
Potassium: 3.5 mmol/L (ref 3.5–5.1)
Sodium: 136 mmol/L (ref 135–145)
TOTAL PROTEIN: 5.5 g/dL — AB (ref 6.5–8.1)

## 2016-04-14 LAB — URINE CULTURE: Culture: NO GROWTH

## 2016-04-14 MED ORDER — CEFUROXIME AXETIL 500 MG PO TABS: 500.0000 mg | ORAL_TABLET | Freq: Two times a day (BID) | ORAL | 0 refills | Status: DC

## 2016-04-14 MED ORDER — DIAZEPAM 5 MG PO TABS: ORAL_TABLET | ORAL | 2 refills | Status: DC

## 2016-04-14 MED ORDER — METRONIDAZOLE 500 MG PO TABS: 500.0000 mg | ORAL_TABLET | Freq: Three times a day (TID) | ORAL | 0 refills | Status: DC

## 2016-04-14 MED ORDER — METRONIDAZOLE 500 MG PO TABS
500.0000 mg | ORAL_TABLET | Freq: Three times a day (TID) | ORAL | Status: DC
  Administered 2016-04-14: 500 mg via ORAL
  Filled 2016-04-14: qty 1

## 2016-04-14 MED ORDER — CEFUROXIME AXETIL 500 MG PO TABS
500.0000 mg | ORAL_TABLET | Freq: Two times a day (BID) | ORAL | Status: DC
  Filled 2016-04-14: qty 1

## 2016-04-14 NOTE — Progress Notes (Signed)
Pt from [facility] and uses home 02 at night. PT recommendations were for HHPT. This CM spoke with pt and daughter at bedside who request HHPT/OT services at home. Orders received from MD. Choice was offered for home health services and [facility] was chosen. [facility] rep contacted for referral. No other CM needs communicated. Marney Doctor RN,BSN,NCM [phone]

## 2016-04-14 NOTE — Evaluation (Signed)
Physical Therapy Evaluation Patient Details Name: [name] [name] MRN: [id number] DOB: May 16, 1923 Today's Date: 04/14/2016   History of Present Illness  [name] [name] is a 82 y.o. female with a past medical history significant for HTN, COPD, mild dementia, history of CVA, history of CAD, mitral valve prolapse, OSA CPAP intolerant who presents with sepsis, UTI, diverticulitis. Recent admission mid Oct for syncope.   Clinical Impression  Pt admitted with above diagnosis. Pt currently with functional limitations due to the deficits listed below (see PT Problem List). Pt required mod A initially for standing balance due to leaning right. At present she is not safe to ambulate alone and would need hands on assist for mobility. Hopefully she'll return to baseline of independence with her rollator quickly. Pt's daughter stated she can assist pt with mobility at ILF.  Pt will benefit from skilled PT to increase their independence and safety with mobility to allow discharge to the venue listed below.       Follow Up Recommendations Supervision for mobility/OOB;Home health PT     Equipment Recommendations  None recommended by PT    Recommendations for Other Services       Precautions / Restrictions Precautions Precautions: Fall Restrictions Weight Bearing Restrictions: No      Mobility  Bed Mobility Overal bed mobility: Needs Assistance Bed Mobility: Supine to Sit     Supine to sit: Min assist     General bed mobility comments: min A to raise trunk, initially had posterior lean in sitting  Transfers Overall transfer level: Needs assistance Equipment used: Rolling walker (2 wheeled) Transfers: Sit to/from Stand Sit to Stand: Min assist         General transfer comment: min A to stabilize initially upon standing due to R lateral lean  Ambulation/Gait Ambulation/Gait assistance: Min assist Ambulation Distance (Feet): 80 Feet Assistive device: Rolling walker (2 wheeled) Gait  Pattern/deviations: Step-through pattern;Decreased stride length     General Gait Details: tends to lean R initially and required min to mod A for balance, this improved with time  Stairs            Wheelchair Mobility    Modified Rankin (Stroke Patients Only)       Balance Overall balance assessment: Needs assistance Sitting-balance support: Feet supported Sitting balance-Leahy Scale: Poor Sitting balance - Comments: intial posterior lean, then able to maintain neutral Postural control: Posterior lean   Standing balance-Leahy Scale: Zero Standing balance comment: leans R initially in standing and for first 10' of ambulation, required mod A                             Pertinent Vitals/Pain Pain Assessment: No/denies pain    Home Living Family/patient expects to be discharged to::  (From [facility])               Home Equipment: Walker - 4 wheels;Grab bars - tub/shower;Shower seat Additional Comments: from [employer] , low commode , walk in shower  (info obtained from previous encounter)    Prior Function Level of Independence: Needs assistance         Comments: pt stated she has assistance for bathing/dressing, meals are brought to her room, she walks with a rollator     Hand Dominance        Extremity/Trunk Assessment   Upper Extremity Assessment: Overall WFL for tasks assessed           Lower Extremity Assessment:  Overall [facility] for tasks assessed      Cervical / Trunk Assessment: Kyphotic  Communication   Communication: HOH  Cognition Arousal/Alertness: Awake/alert Behavior During Therapy: WFL for tasks assessed/performed Overall Cognitive Status: No family/caregiver present to determine baseline cognitive functioning (h/o dementia)                      General Comments      Exercises     Assessment/Plan    PT Assessment Patient needs continued PT services  PT Problem List Decreased activity  tolerance;Decreased balance;Decreased mobility          PT Treatment Interventions Gait training;Functional mobility training;Therapeutic exercise;Therapeutic activities;Balance training;Patient/family education    PT Goals (Current goals can be found in the Care Plan section)  Acute Rehab PT Goals PT Goal Formulation: Patient unable to participate in goal setting Time For Goal Achievement: 04/28/16 Potential to Achieve Goals: Good    Frequency Min 3X/week   Barriers to discharge        Co-evaluation               End of Session Equipment Utilized During Treatment: Gait belt Activity Tolerance: Patient tolerated treatment well;No increased pain Patient left: in bed;with call bell/phone within reach;with bed alarm set Nurse Communication: Mobility status    Functional Assessment Tool Used: clinical judgement Functional Limitation: Mobility: Walking and moving around Mobility: Walking and Moving Around Current Status [id number]): At least 20 percent but less than 40 percent impaired, limited or restricted Mobility: Walking and Moving Around Goal Status [phone]): At least 1 percent but less than 20 percent impaired, limited or restricted    Time: 1254-1310 PT Time Calculation (min) (ACUTE ONLY): 16 min   Charges:   PT Evaluation $PT Eval Low Complexity: 1 Procedure     PT G Codes:   PT G-Codes **NOT FOR INPATIENT CLASS** Functional Assessment Tool Used: clinical judgement Functional Limitation: Mobility: Walking and moving around Mobility: Walking and Moving Around Current Status [id number]): At least 20 percent but less than 40 percent impaired, limited or restricted Mobility: Walking and Moving Around Goal Status [phone]): At least 1 percent but less than 20 percent impaired, limited or restricted    [name] [name] 04/14/2016, 1:21 PM  [phone]

## 2016-04-14 NOTE — Discharge Summary (Signed)
Physician Discharge Summary  [name] [name] [id number] DOB: 03-Jul-1923 DOA: 04/13/2016  PCP: Garret Reddish, MD  Admit date: 04/13/2016 Discharge date: 04/14/2016  Admitted From: independent living Disposition:  same   Recommendations for Outpatient Follow-up:  1. HHPT  Home Health:  yes     Discharge Condition:  stable   CODE STATUS:  Full code   Diet recommendation:  Heart healthy Consultations:  none    Discharge Diagnoses:  Principal Problem:   Sepsis ([place]) Active Problems:   UTI (urinary tract infection)   Diverticulitis   Essential hypertension   Raynaud's syndrome   COPD with emphysema Gold B   History of breast cancer, T1b, N0, Lumpectomy 07/21/2007.   History of CVA (cerebrovascular accident)   Alzheimer's dementia    Subjective: No complaints.   Brief Summary: [name] [name] a 82 y.o.femalewith medical history significant of alzheimer's dementia, hard of hearing is brought from independent livign by daughter who found her to be very weak today. She is found to have a fever of 100.9. She was recently treated for a UTI in Oct with Keflex. Her UA was positive but on exam was noted to have abdominal tenderness. She has not complained of abdominal pain, nausea or vomiting. CT scan revealed diverticulitis or prox descending colon. UA was + as well.   Hospital Course:  Principal Problem: Sepsis - fever, leukocytosis - diverticulitis and UTI - Rocephin and Flagyl >> change to Vantin and oral Flagyl today and monitor - advanced to soft diet as stomach is no longer tender on exam  Active Problems:   COPD with emphysema Gold B - PRN nebs  History of breast cancer, T1b, N0, Lumpectomy 07/21/2007.  History of CVA (cerebrovascular accident) - ASA 325 mg  Alzheimer's dementia - Aricept   Discharge Instructions  Discharge Instructions    Diet - low sodium heart healthy    Complete by:  As directed    Increase activity slowly    Complete  by:  As directed        Medication List    STOP taking these medications   cephALEXin 500 MG capsule Commonly known as:  KEFLEX     TAKE these medications   albuterol 108 (90 Base) MCG/ACT inhaler Commonly known as:  PROVENTIL HFA;VENTOLIN HFA Inhale 2 puffs into the lungs every 6 (six) hours as needed for wheezing or shortness of breath.   aspirin 325 MG tablet Take 325 mg by mouth at bedtime.   budesonide-formoterol 160-4.5 MCG/ACT inhaler Commonly known as:  SYMBICORT Inhale 2 puffs into the lungs 2 (two) times daily.   cefUROXime 500 MG tablet Commonly known as:  CEFTIN Take 1 tablet (500 mg total) by mouth 2 (two) times daily with a meal.   diazepam 5 MG tablet Commonly known as:  VALIUM TAKE 1 MG BY MOUTH AT BEDTIME What changed:  See the new instructions.   donepezil 10 MG tablet Commonly known as:  ARICEPT Take 1/2 tablet daily for 1 month, then increase to 1 tablet daily What changed:  how much to take  how to take this  when to take this  additional instructions   fluticasone 50 MCG/ACT nasal spray Commonly known as:  FLONASE Place 2 sprays into both nostrils daily. What changed:  when to take this   metroNIDAZOLE 500 MG tablet Commonly known as:  FLAGYL Take 1 tablet (500 mg total) by mouth every 8 (eight) hours.   nitroGLYCERIN 0.4 MG SL tablet Commonly known as:  NITROSTAT Place  1 tablet (0.4 mg total) under the tongue every 5 (five) minutes as needed for chest pain (take as directed).   ondansetron 4 MG tablet Commonly known as:  ZOFRAN Take 1 tablet (4 mg total) by mouth every 8 (eight) hours as needed for nausea or vomiting.       Allergies  Allergen Reactions  . Azithromycin Hives, Diarrhea, Dermatitis and Rash  . Ciprofloxacin Swelling  . Levaquin [Levofloxacin] Hives  . Penicillins Hives    has had mild doses that she did not have reactions to  Has patient had a PCN reaction causing immediate rash, facial/tongue/throat  swelling, SOB or lightheadedness with hypotension:Yes Has patient had a PCN reaction causing severe rash involving mucus membranes or skin necrosis: No Has patient had a PCN reaction that required hospitalization: no Has patient had a PCN reaction occurring within the last 10 years: no If all of the above answers are "NO", then may proceed with Cephalosporin use.   . Sulfamethoxazole Nausea And Vomiting     Procedures/Studies:   Ct Abdomen Pelvis W Contrast  Result Date: 04/13/2016 CLINICAL DATA:  Generalized abdominal pain and fever. EXAM: CT ABDOMEN AND PELVIS WITH CONTRAST TECHNIQUE: Multidetector CT imaging of the abdomen and pelvis was performed using the standard protocol following bolus administration of intravenous contrast. CONTRAST:  12mL ISOVUE-300 IOPAMIDOL (ISOVUE-300) INJECTION 61% COMPARISON:  02/28/2016 FINDINGS: Lower Chest: No acute findings. Hepatobiliary: No masses identified. Tiny calcified gallstones again noted, without evidence of cholecystitis or biliary ductal dilatation. Pancreas:  No mass or inflammatory changes. Spleen: Within normal limits in size and appearance. Adrenals/Urinary Tract: No masses identified. Small renal cysts noted bilaterally. No evidence of hydronephrosis. Unremarkable urinary bladder. Stomach/Bowel: Diffuse colonic diverticulosis noted which is most severe in the sigmoid colon. Mild colonic wall thickening and pericolonic inflammatory changes are seen involving the proximal descending colon and area of diverticular disease, consistent with mild diverticulitis. No evidence of extraluminal air or abscess. Vascular/Lymphatic: No pathologically enlarged lymph nodes. No abdominal aortic aneurysm. Aortic atherosclerosis. Reproductive: Prior hysterectomy noted. Adnexal regions are unremarkable in appearance. Other:  None. Musculoskeletal:  No suspicious bone lesions identified. IMPRESSION: Mild diverticulitis involving the proximal descending colon. No  evidence of abscess or other complication. Cholelithiasis.  No radiographic evidence of cholecystitis. Aortic atherosclerosis. Electronically Signed   By: Earle Gell M.D.   On: 04/13/2016 15:49       Discharge Exam: Vitals:   04/13/16 2135 04/14/16 0518  BP: (!) 96/50 (!) 105/54  Pulse: 60 64  Resp: 20 18  Temp: 98.4 F (36.9 C) 97.9 F (36.6 C)   Vitals:   04/13/16 1807 04/13/16 1945 04/13/16 2135 04/14/16 0518  BP: (!) 99/52  (!) 96/50 (!) 105/54  Pulse: 62  60 64  Resp: 20  20 18   Temp: 97.7 F (36.5 C)  98.4 F (36.9 C) 97.9 F (36.6 C)  TempSrc: Oral  Oral Axillary  SpO2: 99% 96% 97% 99%  Weight: 52.3 kg (115 lb 4.8 oz)     Height: 5\' 2"  (1.575 m)       General: Pt is alert, awake, not in acute distress Cardiovascular: RRR, S1/S2 +, no rubs, no gallops Respiratory: CTA bilaterally, no wheezing, no rhonchi Abdominal: Soft, NT, ND, bowel sounds + Extremities: no edema, no cyanosis    The results of significant diagnostics from this hospitalization (including imaging, microbiology, ancillary and laboratory) are listed below for reference.     Microbiology: Recent Results (from the past 240 hour(s))  Urine  culture     Status: None   Collection Time: 04/10/16  5:22 PM  Result Value Ref Range Status   Culture ESCHERICHIA COLI  Final   Colony Count Greater than 100,000 CFU/mL  Final   Organism ID, Bacteria ESCHERICHIA COLI  Final      Susceptibility   Escherichia coli -  (no method available)    AMPICILLIN >=32 Resistant     AMOX/CLAVULANIC 16 Intermediate     AMPICILLIN/SULBACTAM >=32 Resistant     PIP/TAZO 8 Sensitive     IMIPENEM <=0.25 Sensitive     CEFAZOLIN <=4 Not Reportable     CEFTRIAXONE <=1 Sensitive     CEFTAZIDIME <=1 Sensitive     CEFEPIME <=1 Sensitive     GENTAMICIN <=1 Sensitive     TOBRAMYCIN <=1 Sensitive     CIPROFLOXACIN 1 Sensitive     LEVOFLOXACIN 1 Sensitive     NITROFURANTOIN <=16 Sensitive     TRIMETH/SULFA* <=20 Sensitive       * NR=NOT REPORTABLE,SEE COMMENTORAL therapy:A cefazolin MIC of <32 predicts susceptibility to the oral agents cefaclor,cefdinir,cefpodoxime,cefprozil,cefuroxime,cephalexin,and loracarbef when used for therapy of uncomplicated UTIs due to E.coli,K.pneumomiae,and P.mirabilis. PARENTERAL therapy: A cefazolinMIC of >8 indicates resistance to parenteralcefazolin. An alternate test method must beperformed to confirm susceptibility to parenteralcefazolin.     Labs: BNP (last 3 results) No results for input(s): BNP in the last 8760 hours. Basic Metabolic Panel:  Recent Labs Lab 04/13/16 1311 04/13/16 1334 04/14/16 0349  NA 135 137 136  K 3.9 3.9 3.5  CL 101 99* 104  CO2 26  --  25  GLUCOSE 130* 128* 105*  BUN 12 11 14   CREATININE 0.95 0.90 0.83  CALCIUM 8.5*  --  7.6*   Liver Function Tests:  Recent Labs Lab 04/13/16 1311 04/14/16 0349  AST 22 15  ALT 11* 9*  ALKPHOS 61 49  BILITOT 1.3* 0.7  PROT 6.6 5.5*  ALBUMIN 3.7 3.0*   No results for input(s): LIPASE, AMYLASE in the last 168 hours. No results for input(s): AMMONIA in the last 168 hours. CBC:  Recent Labs Lab 04/13/16 1311 04/13/16 1334  WBC 14.6*  --   NEUTROABS 13.5*  --   HGB 13.0 13.6  HCT 38.8 40.0  MCV 86.2  --   PLT 213  --    Cardiac Enzymes: No results for input(s): CKTOTAL, CKMB, CKMBINDEX, TROPONINI in the last 168 hours. BNP: Invalid input(s): POCBNP CBG: No results for input(s): GLUCAP in the last 168 hours. D-Dimer No results for input(s): DDIMER in the last 72 hours. Hgb A1c No results for input(s): HGBA1C in the last 72 hours. Lipid Profile No results for input(s): CHOL, HDL, LDLCALC, TRIG, CHOLHDL, LDLDIRECT in the last 72 hours. Thyroid function studies No results for input(s): TSH, T4TOTAL, T3FREE, THYROIDAB in the last 72 hours.  Invalid input(s): FREET3 Anemia work up No results for input(s): VITAMINB12, FOLATE, FERRITIN, TIBC, IRON, RETICCTPCT in the last 72 hours. Urinalysis     Component Value Date/Time   COLORURINE ORANGE (A) 04/13/2016 1334   APPEARANCEUR CLOUDY (A) 04/13/2016 1334   LABSPEC 1.023 04/13/2016 1334   PHURINE 5.5 04/13/2016 1334   GLUCOSEU NEGATIVE 04/13/2016 1334   HGBUR NEGATIVE 04/13/2016 1334   HGBUR negative 04/07/2008 1505   BILIRUBINUR MODERATE (A) 04/13/2016 1334   BILIRUBINUR negative 04/10/2016 1718   KETONESUR 40 (A) 04/13/2016 1334   PROTEINUR 30 (A) 04/13/2016 1334   UROBILINOGEN 1.0 04/10/2016 1718   UROBILINOGEN 0.2 07/25/2014 2145  NITRITE NEGATIVE 04/13/2016 1334   LEUKOCYTESUR MODERATE (A) 04/13/2016 1334   Sepsis Labs Invalid input(s): PROCALCITONIN,  WBC,  LACTICIDVEN Microbiology Recent Results (from the past 240 hour(s))  Urine culture     Status: None   Collection Time: 04/10/16  5:22 PM  Result Value Ref Range Status   Culture ESCHERICHIA COLI  Final   Colony Count Greater than 100,000 CFU/mL  Final   Organism ID, Bacteria ESCHERICHIA COLI  Final      Susceptibility   Escherichia coli -  (no method available)    AMPICILLIN >=32 Resistant     AMOX/CLAVULANIC 16 Intermediate     AMPICILLIN/SULBACTAM >=32 Resistant     PIP/TAZO 8 Sensitive     IMIPENEM <=0.25 Sensitive     CEFAZOLIN <=4 Not Reportable     CEFTRIAXONE <=1 Sensitive     CEFTAZIDIME <=1 Sensitive     CEFEPIME <=1 Sensitive     GENTAMICIN <=1 Sensitive     TOBRAMYCIN <=1 Sensitive     CIPROFLOXACIN 1 Sensitive     LEVOFLOXACIN 1 Sensitive     NITROFURANTOIN <=16 Sensitive     TRIMETH/SULFA* <=20 Sensitive      * NR=NOT REPORTABLE,SEE COMMENTORAL therapy:A cefazolin MIC of <32 predicts susceptibility to the oral agents cefaclor,cefdinir,cefpodoxime,cefprozil,cefuroxime,cephalexin,and loracarbef when used for therapy of uncomplicated UTIs due to E.coli,K.pneumomiae,and P.mirabilis. PARENTERAL therapy: A cefazolinMIC of >8 indicates resistance to parenteralcefazolin. An alternate test method must beperformed to confirm susceptibility to  parenteralcefazolin.     Time coordinating discharge: Over 30 minutes  SIGNED:   Debbe Odea, MD  [facility] 04/14/2016, 1:42 PM Pager   If 7PM-7AM, please contact night-coverage [email or web address] Password [email or web address]

## 2016-04-14 NOTE — Progress Notes (Addendum)
PROGRESS NOTE    [name] [name]  [id number] DOB: [birth year] DOA: 04/13/2016  PCP: Garret Reddish, MD   Brief Narrative:  [name] [name] is a 82 y.o. female with medical history significant of alzheimer's dementia, hard of hearing is brought from independent livign by daughter who found her to be very weak today. She is found to have a fever of 100.9. She was recently treated for a UTI in Oct with Keflex. Her UA was positive but on exam was noted to have abdominal tenderness. She has not complained of abdominal pain, nausea or vomiting.  CT scan revealed diverticulitis or prox descending colon. UA was + as well.   Subjective: No complaints today.   Assessment & Plan:  Principal Problem:   Sepsis - fever, leukocytosis - diverticulitis and UTI - Rocephin and Flagyl >> change to Vantin and oral Flagyl today and monitor - advance to soft diet as stomach is no longer tender on exam  Active Problems:       COPD with emphysema Gold B - PRN nebs    History of breast cancer, T1b, N0, Lumpectomy 07/21/2007.    History of CVA (cerebrovascular accident) - ASA 325 mg    Alzheimer's dementia  - Aricept       DVT prophylaxis: Lovenox Code Status: Full code Family Communication: daughter Disposition Plan: return to independent living tomorrow Consultants:    Procedures:    Antimicrobials:  Anti-infectives    Start     Dose/Rate Route Frequency Ordered Stop   04/14/16 1700  cefUROXime (CEFTIN) tablet 500 mg     500 mg Oral 2 times daily with meals 04/14/16 0918     04/14/16 1500  cefTRIAXone (ROCEPHIN) 1 g in dextrose 5 % 50 mL IVPB  Status:  Discontinued     1 g 100 mL/hr over 30 Minutes Intravenous Every 24 hours 04/13/16 2127 04/14/16 0918   04/14/16 0930  metroNIDAZOLE (FLAGYL) tablet 500 mg     500 mg Oral Every 8 hours 04/14/16 0918     04/13/16 2359  metroNIDAZOLE (FLAGYL) IVPB 500 mg  Status:  Discontinued     500 mg 100 mL/hr over 60 Minutes Intravenous Every 8  hours 04/13/16 2127 04/14/16 0918   04/13/16 1615  metroNIDAZOLE (FLAGYL) IVPB 500 mg     500 mg 100 mL/hr over 60 Minutes Intravenous  Once 04/13/16 1611 04/13/16 1735   04/13/16 1445  cefTRIAXone (ROCEPHIN) 1 g in dextrose 5 % 50 mL IVPB     1 g 100 mL/hr over 30 Minutes Intravenous  Once 04/13/16 1436 04/13/16 1635       Objective: Vitals:   04/13/16 1807 04/13/16 1945 04/13/16 2135 04/14/16 0518  BP: (!) 99/52  (!) 96/50 (!) 105/54  Pulse: 62  60 64  Resp: 20  20 18   Temp: 97.7 F (36.5 C)  98.4 F (36.9 C) 97.9 F (36.6 C)  TempSrc: Oral  Oral Axillary  SpO2: 99% 96% 97% 99%  Weight: 52.3 kg (115 lb 4.8 oz)     Height: 5\' 2"  (1.575 m)       Intake/Output Summary (Last 24 hours) at 04/14/16 1133 Last data filed at 04/14/16 1007  Gross per 24 hour  Intake           3142.1 ml  Output              251 ml  Net           2891.1 ml   [employer]  04/13/16 1245 04/13/16 1254 04/13/16 1807  Weight: 52.6 kg (116 lb) 52.6 kg (116 lb) 52.3 kg (115 lb 4.8 oz)    Examination: General exam: Appears comfortable  HEENT: PERRLA, oral mucosa moist, no sclera icterus or thrush Respiratory system: Clear to auscultation. Respiratory effort normal. Cardiovascular system: S1 & S2 heard, RRR.  No murmurs  Gastrointestinal system: Abdomen soft, non-tender, nondistended. Normal bowel sound. No organomegaly Central nervous system: Alert and oriented. No focal neurological deficits. Extremities: No cyanosis, clubbing or edema Skin: No rashes or ulcers Psychiatry:  Mood & affect appropriate.     Data Reviewed: I have personally reviewed following labs and imaging studies  CBC:  Recent Labs Lab 04/13/16 1311 04/13/16 1334  WBC 14.6*  --   NEUTROABS 13.5*  --   HGB 13.0 13.6  HCT 38.8 40.0  MCV 86.2  --   PLT 213  --    Basic Metabolic Panel:  Recent Labs Lab 04/13/16 1311 04/13/16 1334 04/14/16 0349  NA 135 137 136  K 3.9 3.9 3.5  CL 101 99* 104  CO2 26  --  25    GLUCOSE 130* 128* 105*  BUN 12 11 14   CREATININE 0.95 0.90 0.83  CALCIUM 8.5*  --  7.6*   GFR: Estimated Creatinine Clearance: 35.6 mL/min (by C-G formula based on SCr of 0.83 mg/dL). Liver Function Tests:  Recent Labs Lab 04/13/16 1311 04/14/16 0349  AST 22 15  ALT 11* 9*  ALKPHOS 61 49  BILITOT 1.3* 0.7  PROT 6.6 5.5*  ALBUMIN 3.7 3.0*   No results for input(s): LIPASE, AMYLASE in the last 168 hours. No results for input(s): AMMONIA in the last 168 hours. Coagulation Profile: No results for input(s): INR, PROTIME in the last 168 hours. Cardiac Enzymes: No results for input(s): CKTOTAL, CKMB, CKMBINDEX, TROPONINI in the last 168 hours. BNP (last 3 results) No results for input(s): PROBNP in the last 8760 hours. HbA1C: No results for input(s): HGBA1C in the last 72 hours. CBG: No results for input(s): GLUCAP in the last 168 hours. Lipid Profile: No results for input(s): CHOL, HDL, LDLCALC, TRIG, CHOLHDL, LDLDIRECT in the last 72 hours. Thyroid Function Tests: No results for input(s): TSH, T4TOTAL, FREET4, T3FREE, THYROIDAB in the last 72 hours. Anemia Panel: No results for input(s): VITAMINB12, FOLATE, FERRITIN, TIBC, IRON, RETICCTPCT in the last 72 hours. Urine analysis:    Component Value Date/Time   COLORURINE ORANGE (A) 04/13/2016 1334   APPEARANCEUR CLOUDY (A) 04/13/2016 1334   LABSPEC 1.023 04/13/2016 1334   PHURINE 5.5 04/13/2016 1334   GLUCOSEU NEGATIVE 04/13/2016 1334   HGBUR NEGATIVE 04/13/2016 1334   HGBUR negative 04/07/2008 1505   BILIRUBINUR MODERATE (A) 04/13/2016 1334   BILIRUBINUR negative 04/10/2016 1718   KETONESUR 40 (A) 04/13/2016 1334   PROTEINUR 30 (A) 04/13/2016 1334   UROBILINOGEN 1.0 04/10/2016 1718   UROBILINOGEN 0.2 07/25/2014 2145   NITRITE NEGATIVE 04/13/2016 1334   LEUKOCYTESUR MODERATE (A) 04/13/2016 1334   Sepsis Labs: @LABRCNTIP (procalcitonin:4,lacticidven:4) ) Recent Results (from the past 240 hour(s))  Urine culture      Status: None   Collection Time: 04/10/16  5:22 PM  Result Value Ref Range Status   Culture ESCHERICHIA COLI  Final   Colony Count Greater than 100,000 CFU/mL  Final   Organism ID, Bacteria ESCHERICHIA COLI  Final      Susceptibility   Escherichia coli -  (no method available)    AMPICILLIN >=32 Resistant     AMOX/CLAVULANIC 16  Intermediate     AMPICILLIN/SULBACTAM >=32 Resistant     PIP/TAZO 8 Sensitive     IMIPENEM <=0.25 Sensitive     CEFAZOLIN <=4 Not Reportable     CEFTRIAXONE <=1 Sensitive     CEFTAZIDIME <=1 Sensitive     CEFEPIME <=1 Sensitive     GENTAMICIN <=1 Sensitive     TOBRAMYCIN <=1 Sensitive     CIPROFLOXACIN 1 Sensitive     LEVOFLOXACIN 1 Sensitive     NITROFURANTOIN <=16 Sensitive     TRIMETH/SULFA* <=20 Sensitive      * NR=NOT REPORTABLE,SEE COMMENTORAL therapy:A cefazolin MIC of <32 predicts susceptibility to the oral agents cefaclor,cefdinir,cefpodoxime,cefprozil,cefuroxime,cephalexin,and loracarbef when used for therapy of uncomplicated UTIs due to E.coli,K.pneumomiae,and P.mirabilis. PARENTERAL therapy: A cefazolinMIC of >8 indicates resistance to parenteralcefazolin. An alternate test method must beperformed to confirm susceptibility to parenteralcefazolin.         Radiology Studies: Ct Abdomen Pelvis W Contrast  Result Date: 04/13/2016 CLINICAL DATA:  Generalized abdominal pain and fever. EXAM: CT ABDOMEN AND PELVIS WITH CONTRAST TECHNIQUE: Multidetector CT imaging of the abdomen and pelvis was performed using the standard protocol following bolus administration of intravenous contrast. CONTRAST:  40mL ISOVUE-300 IOPAMIDOL (ISOVUE-300) INJECTION 61% COMPARISON:  02/28/2016 FINDINGS: Lower Chest: No acute findings. Hepatobiliary: No masses identified. Tiny calcified gallstones again noted, without evidence of cholecystitis or biliary ductal dilatation. Pancreas:  No mass or inflammatory changes. Spleen: Within normal limits in size and appearance.  Adrenals/Urinary Tract: No masses identified. Small renal cysts noted bilaterally. No evidence of hydronephrosis. Unremarkable urinary bladder. Stomach/Bowel: Diffuse colonic diverticulosis noted which is most severe in the sigmoid colon. Mild colonic wall thickening and pericolonic inflammatory changes are seen involving the proximal descending colon and area of diverticular disease, consistent with mild diverticulitis. No evidence of extraluminal air or abscess. Vascular/Lymphatic: No pathologically enlarged lymph nodes. No abdominal aortic aneurysm. Aortic atherosclerosis. Reproductive: Prior hysterectomy noted. Adnexal regions are unremarkable in appearance. Other:  None. Musculoskeletal:  No suspicious bone lesions identified. IMPRESSION: Mild diverticulitis involving the proximal descending colon. No evidence of abscess or other complication. Cholelithiasis.  No radiographic evidence of cholecystitis. Aortic atherosclerosis. Electronically Signed   By: Earle Gell M.D.   On: 04/13/2016 15:49      Scheduled Meds: . aspirin  325 mg Oral QHS  . cefUROXime  500 mg Oral BID WC  . diazepam  1 mg Oral QHS  . donepezil  10 mg Oral QHS  . enoxaparin (LOVENOX) injection  40 mg Subcutaneous Q24H  . feeding supplement  1 Container Oral TID BM  . fluticasone  2 spray Each Nare QHS  . mouth rinse  15 mL Mouth Rinse BID  . metroNIDAZOLE  500 mg Oral Q8H  . mometasone-formoterol  2 puff Inhalation BID   Continuous Infusions:   LOS: 0 days    Time spent in minutes: 61    [place], MD [facility] Pager: [email or web address] Password [email or web address] 04/14/2016, 11:33 AM

## 2016-04-14 NOTE — Progress Notes (Signed)
Patient discharged to independent living facility, all discharge medications and instructions reviewed and questions answered.  Patient to be assisted to vehicle by wheelchair.

## 2016-04-14 NOTE — Progress Notes (Addendum)
Pt is from [place] with [facility] under observation status. PT eval is pending. Unfortunately, if SNF is recommended medicare will not cover cost due to observation status. CSW is available to assist with pvt pay placement, if needed.  Werner Lean LCSW [phone]

## 2016-04-15 ENCOUNTER — Emergency Department (HOSPITAL_COMMUNITY): Payer: Medicare Other

## 2016-04-15 ENCOUNTER — Emergency Department (HOSPITAL_COMMUNITY)
Admission: EM | Admit: 2016-04-15 | Discharge: 2016-04-15 | Disposition: A | Discharge status: Home / Self Care | Payer: Medicare Other | Attending: Emergency Medicine | Admitting: Emergency Medicine

## 2016-04-15 DIAGNOSIS — Z853 Personal history of malignant neoplasm of breast: Secondary | ICD-10-CM | POA: Insufficient documentation

## 2016-04-15 DIAGNOSIS — Z8673 Personal history of transient ischemic attack (TIA), and cerebral infarction without residual deficits: Secondary | ICD-10-CM | POA: Diagnosis not present

## 2016-04-15 DIAGNOSIS — G309 Alzheimer's disease, unspecified: Secondary | ICD-10-CM | POA: Insufficient documentation

## 2016-04-15 DIAGNOSIS — J449 Chronic obstructive pulmonary disease, unspecified: Secondary | ICD-10-CM | POA: Diagnosis not present

## 2016-04-15 DIAGNOSIS — R1030 Lower abdominal pain, unspecified: Secondary | ICD-10-CM | POA: Diagnosis present

## 2016-04-15 DIAGNOSIS — N3000 Acute cystitis without hematuria: Secondary | ICD-10-CM | POA: Insufficient documentation

## 2016-04-15 DIAGNOSIS — Z7982 Long term (current) use of aspirin: Secondary | ICD-10-CM | POA: Diagnosis not present

## 2016-04-15 DIAGNOSIS — R0689 Other abnormalities of breathing: Secondary | ICD-10-CM | POA: Diagnosis not present

## 2016-04-15 DIAGNOSIS — I2510 Atherosclerotic heart disease of native coronary artery without angina pectoris: Secondary | ICD-10-CM | POA: Diagnosis not present

## 2016-04-15 DIAGNOSIS — F0280 Dementia in other diseases classified elsewhere without behavioral disturbance: Secondary | ICD-10-CM | POA: Diagnosis not present

## 2016-04-15 DIAGNOSIS — R0789 Other chest pain: Secondary | ICD-10-CM | POA: Insufficient documentation

## 2016-04-15 DIAGNOSIS — K5732 Diverticulitis of large intestine without perforation or abscess without bleeding: Secondary | ICD-10-CM | POA: Insufficient documentation

## 2016-04-15 DIAGNOSIS — Z87891 Personal history of nicotine dependence: Secondary | ICD-10-CM | POA: Diagnosis not present

## 2016-04-15 DIAGNOSIS — I10 Essential (primary) hypertension: Secondary | ICD-10-CM | POA: Diagnosis not present

## 2016-04-15 DIAGNOSIS — R079 Chest pain, unspecified: Secondary | ICD-10-CM | POA: Diagnosis not present

## 2016-04-15 LAB — URINALYSIS, ROUTINE W REFLEX MICROSCOPIC
Bacteria, UA: NONE SEEN
Bilirubin Urine: NEGATIVE
GLUCOSE, UA: NEGATIVE mg/dL
HGB URINE DIPSTICK: NEGATIVE
Ketones, ur: 5 mg/dL — AB
NITRITE: NEGATIVE
PH: 7 (ref 5.0–8.0)
Protein, ur: NEGATIVE mg/dL
SPECIFIC GRAVITY, URINE: 1.012 (ref 1.005–1.030)

## 2016-04-15 LAB — COMPREHENSIVE METABOLIC PANEL
ALK PHOS: 79 U/L (ref 38–126)
ALT: 22 U/L (ref 14–54)
AST: 57 U/L — ABNORMAL HIGH (ref 15–41)
Albumin: 4.1 g/dL (ref 3.5–5.0)
Anion gap: 9 (ref 5–15)
BILIRUBIN TOTAL: 1 mg/dL (ref 0.3–1.2)
BUN: 10 mg/dL (ref 6–20)
CALCIUM: 8.6 mg/dL — AB (ref 8.9–10.3)
CO2: 27 mmol/L (ref 22–32)
CREATININE: 0.9 mg/dL (ref 0.44–1.00)
Chloride: 100 mmol/L — ABNORMAL LOW (ref 101–111)
GFR calc non Af Amer: 55 mL/min — ABNORMAL LOW (ref >60)
Glucose, Bld: 122 mg/dL — ABNORMAL HIGH (ref 65–99)
Potassium: 3.9 mmol/L (ref 3.5–5.1)
SODIUM: 136 mmol/L (ref 135–145)
TOTAL PROTEIN: 6.8 g/dL (ref 6.5–8.1)

## 2016-04-15 LAB — BRAIN NATRIURETIC PEPTIDE: B Natriuretic Peptide: 185.3 pg/mL — ABNORMAL HIGH (ref 0.0–100.0)

## 2016-04-15 LAB — CBC WITH DIFFERENTIAL/PLATELET
BASOS ABS: 0 10*3/uL (ref 0.0–0.1)
BASOS PCT: 0 %
EOS ABS: 0 10*3/uL (ref 0.0–0.7)
Eosinophils Relative: 0 %
HCT: 39.1 % (ref 36.0–46.0)
HEMOGLOBIN: 13.2 g/dL (ref 12.0–15.0)
Lymphocytes Relative: 2 %
Lymphs Abs: 0.3 10*3/uL — ABNORMAL LOW (ref 0.7–4.0)
MCH: 28.6 pg (ref 26.0–34.0)
MCHC: 33.8 g/dL (ref 30.0–36.0)
MCV: 84.8 fL (ref 78.0–100.0)
MONO ABS: 0.5 10*3/uL (ref 0.1–1.0)
MONOS PCT: 3 %
NEUTROS PCT: 95 %
Neutro Abs: 16.3 10*3/uL — ABNORMAL HIGH (ref 1.7–7.7)
Platelets: 331 10*3/uL (ref 150–400)
RBC: 4.61 MIL/uL (ref 3.87–5.11)
RDW: 14.9 % (ref 11.5–15.5)
WBC: 17.1 10*3/uL — ABNORMAL HIGH (ref 4.0–10.5)

## 2016-04-15 LAB — I-STAT TROPONIN, ED: Troponin i, poc: 0 ng/mL (ref 0.00–0.08)

## 2016-04-15 LAB — I-STAT CG4 LACTIC ACID, ED: Lactic Acid, Venous: 1.49 mmol/L (ref 0.5–1.9)

## 2016-04-15 MED ORDER — ACETAMINOPHEN 325 MG PO TABS
650.0000 mg | ORAL_TABLET | Freq: Once | ORAL | Status: AC
  Administered 2016-04-15: 650 mg via ORAL
  Filled 2016-04-15: qty 2

## 2016-04-15 MED ORDER — SODIUM CHLORIDE 0.9 % IJ SOLN
INTRAMUSCULAR | Status: AC
  Filled 2016-04-15: qty 50

## 2016-04-15 MED ORDER — IOPAMIDOL (ISOVUE-300) INJECTION 61%
INTRAVENOUS | Status: AC
  Administered 2016-04-15: 100 mL
  Filled 2016-04-15: qty 100

## 2016-04-15 MED ORDER — SODIUM CHLORIDE 0.9 % IV BOLUS (SEPSIS)
1000.0000 mL | Freq: Once | INTRAVENOUS | Status: AC
  Administered 2016-04-15: 1000 mL via INTRAVENOUS

## 2016-04-15 NOTE — ED Provider Notes (Signed)
[facility] DEPT Provider Note   CSN: [id number] Arrival date & time: 04/15/16  0935     History   Chief Complaint Chief Complaint  Patient presents with  . Abdominal Pain  . Chest Pain    HPI [name] [name] is a 82 y.o. female.  The history is provided by the patient.  Abdominal Pain   This is a new problem. Episode onset: several days. The problem occurs constantly. The problem has not changed since onset.Pain location: lower. Associated symptoms include fever and nausea. Pertinent negatives include vomiting. Past medical history comments: currently being treated for diverticulitis and UTI.  Chest Pain   This is a new problem. Episode onset: 5 days. The problem occurs constantly. The problem has not changed since onset.The pain is associated with movement. The pain is present in the lateral region (bilateral). The pain is moderate. Quality: soreness. The pain does not radiate. Associated symptoms include abdominal pain, cough, a fever, malaise/fatigue, nausea and sputum production. Pertinent negatives include no lower extremity edema and no vomiting.  Her past medical history is significant for COPD, hyperlipidemia and hypertension.  Pertinent negatives for past medical history include no CHF, no diabetes, no MI and no strokes. Past medical history comments: currently being treated for diverticulitis and UTI  Procedure history is negative for cardiac catheterization.   Beliefs she has pneumonia.  Past Medical History:  Diagnosis Date  . Airway obstruction   . Allergic rhinitis   . Anal stricture   . ANXIETY 08/16/2008  . Atrial fibrillation ([place])    she denies known hx of afib 02/24/13  . CAD, UNSPECIFIED SITE 10/17/2008  . Candida esophagitis ([place])   . CHOLELITHIASIS 08/23/2008  . Chronic constipation   . Colitis    sigmoid  . Complication of anesthesia    difficult intubation  . COPD 01/04/2010   FeV1 101%, DLCO64% 2008  . COPD (chronic obstructive pulmonary disease)  ([place]) 01/04/2010  . 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 Diverticulitis   . DIVERTICULOSIS-COLON 04/07/2008  . Emphysema of lung ([place])   . Esophageal stricture   . Fatty liver   . GERD 04/19/2007  . Hearing aid worn    bilateral  . Hiatal hernia   . History of diverticulitis of colon   . HYPERSOMNIA, ASSOCIATED WITH SLEEP APNEA 02/01/2007  . HYPERTENSION 11/12/2006  . IBS (irritable bowel syndrome)   . MITRAL VALVE PROLAPSE, HX OF 10/07/2008  . NEOPLASM, MALIGNANT, BREAST, RIGHT 02/15/2008  . OSTEOPOROSIS 04/04/2008  . Pulmonary fibrosis ([place])   . Raynaud's syndrome 04/25/2009  . Sleep apnea   . Tortuous colon   . UTI (urinary tract infection)     Patient Active Problem List   Diagnosis Date Noted  . UTI (urinary tract infection) 04/13/2016  . Diverticulitis 04/13/2016  . Sepsis ([place]) 04/13/2016  . Diastolic dysfunction 0000000  . Abdominal aortic atherosclerosis ([place]) 03/08/2016  . Syncope 02/23/2016  . Allergic rhinitis 04/12/2014  . Constipation 04/12/2014  . Alzheimer's dementia 04/12/2014  . Osteoarthritis, knee 04/12/2014  . Insomnia 11/21/2013  . History of CVA (cerebrovascular accident) 07/29/2013  . Pulmonary nodules 07/29/2013  . Recurrent UTI 07/13/2013  . History of breast cancer, T1b, N0, Lumpectomy 07/21/2007. 04/21/2011  . COPD with emphysema Gold B 01/04/2010  . Raynaud's syndrome 04/25/2009  . GERD 04/19/2007  . Essential hypertension 11/12/2006    Past Surgical History:  Procedure Laterality Date  . ABDOMINAL HYSTERECTOMY    . APPENDECTOMY    . BILATERAL SALPINGOOPHORECTOMY  s/p prolapsed bladder  . BLADDER SURGERY     prolapsed  . BREAST CYST EXCISION     left  . BREAST SURGERY  07-21-07   mastectomy (right), all margins neg and LNs neg   . EYE SURGERY     cataract removal bilateral  . LUNG SURGERY    . MASTECTOMY     right  . MELANOMA EXCISION     rt clavicle Dr Rhoder[facility] office)  . MOUTH SURGERY    . ROTATOR CUFF  REPAIR    . THORACOTOMY     granuloma, pulmonary-thoracotomy  . TONSILLECTOMY      OB History    No data available       Home Medications    Prior to Admission medications   Medication Sig Start Date End Date Taking? Authorizing Provider  albuterol (PROVENTIL HFA;VENTOLIN HFA) 108 (90 Base) MCG/ACT inhaler Inhale 2 puffs into the lungs every 6 (six) hours as needed for wheezing or shortness of breath. 11/23/15  Yes Dorothyann Peng, NP  aspirin 325 MG tablet Take 325 mg by mouth at bedtime.    Yes Historical Provider, MD  budesonide-formoterol (SYMBICORT) 160-4.5 MCG/ACT inhaler Inhale 2 puffs into the lungs 2 (two) times daily. 02/19/16  Yes Marin Olp, MD  cefUROXime (CEFTIN) 500 MG tablet Take 1 tablet (500 mg total) by mouth 2 (two) times daily with a meal. 04/14/16  Yes Debbe Odea, MD  diazepam (VALIUM) 5 MG tablet TAKE 1 MG BY MOUTH AT BEDTIME Patient taking differently: Take 5 mg by mouth at bedtime.  04/14/16  Yes Debbe Odea, MD  donepezil (ARICEPT) 10 MG tablet Take 1/2 tablet daily for 1 month, then increase to 1 tablet daily Patient taking differently: Take 10 mg by mouth at bedtime.  12/07/15  Yes Cameron Sprang, MD  fluticasone [facility]) 50 MCG/ACT nasal spray Place 2 sprays into both nostrils daily. Patient taking differently: Place 2 sprays into both nostrils at bedtime.  11/19/15  Yes Delano Metz, FNP  metroNIDAZOLE (FLAGYL) 500 MG tablet Take 1 tablet (500 mg total) by mouth every 8 (eight) hours. 04/14/16  Yes Debbe Odea, MD  nitroGLYCERIN (NITROSTAT) 0.4 MG SL tablet Place 1 tablet (0.4 mg total) under the tongue every 5 (five) minutes as needed for chest pain (take as directed). 04/12/15  Yes Fay Records, MD  ondansetron (ZOFRAN) 4 MG tablet Take 1 tablet (4 mg total) by mouth every 8 (eight) hours as needed for nausea or vomiting. 03/19/16  Yes Dorothyann Peng, NP    Family History Family History  Problem Relation Age of Onset  . Parkinsonism Father   .  Coronary artery disease Father   . Stroke Mother   . Heart failure Mother   . Breast cancer      aunt  . Colon cancer      aunt, age 71  . Uterine cancer      aunt  . Breast cancer Maternal Aunt   . Colon cancer Maternal Aunt   . Uterine cancer Maternal Aunt   . Lung disease Neg Hx     Social History Social History  Substance Use Topics  . Smoking status: Former Smoker    Packs/day: 2.00    Years: 30.00    Types: Cigarettes    Quit date: 05/13/1983  . Smokeless tobacco: Never Used  . Alcohol use 0.0 oz/week     Comment: 1-2 glaases wine per day     Allergies   Azithromycin; Ciprofloxacin; Levaquin [  levofloxacin]; Penicillins; and Sulfamethoxazole   Review of Systems Review of Systems  Constitutional: Positive for fever and malaise/fatigue.  Respiratory: Positive for cough and sputum production.   Cardiovascular: Positive for chest pain.  Gastrointestinal: Positive for abdominal pain and nausea. Negative for vomiting.  Ten systems are reviewed and are negative for acute change except as noted in the HPI    Physical Exam Updated Vital Signs BP 145/69   Pulse 77   Temp 101.7 F (38.7 C) (Rectal)   Resp 25   SpO2 96%   Physical Exam  Constitutional: She is oriented to person, place, and time. She appears well-developed and well-nourished. No distress.  HENT:  Head: Normocephalic and atraumatic.  Nose: Nose normal.  Eyes: Conjunctivae and EOM are normal. Pupils are equal, round, and reactive to light. Right eye exhibits no discharge. Left eye exhibits no discharge. No scleral icterus.  Neck: Normal range of motion. Neck supple.  Cardiovascular: Normal rate and regular rhythm.  Exam reveals no gallop and no friction rub.   No murmur heard. Pulmonary/Chest: Effort normal. No stridor. No respiratory distress. She has wheezes in the right lower field and the left lower field. She has no rales. She exhibits tenderness.    Diminished breath sounds throughout     Abdominal: Soft. She exhibits no distension. There is tenderness in the right lower quadrant, suprapubic area and left lower quadrant. There is no rigidity, no rebound, no guarding and no CVA tenderness.  Musculoskeletal: She exhibits no edema or tenderness.  Neurological: She is alert and oriented to person, place, and time.  Skin: Skin is warm and dry. No rash noted. She is not diaphoretic. No erythema.  Psychiatric: She has a normal mood and affect.  Vitals reviewed.    ED Treatments / Results  Labs (all labs ordered are listed, but only abnormal results are displayed) Labs Reviewed  COMPREHENSIVE METABOLIC PANEL - Abnormal; Notable for the following:       Result Value   Chloride 100 (*)    Glucose, Bld 122 (*)    Calcium 8.6 (*)    AST 57 (*)    GFR calc non Af Amer 55 (*)    All other components within normal limits  CBC WITH DIFFERENTIAL/PLATELET - Abnormal; Notable for the following:    WBC 17.1 (*)    Neutro Abs 16.3 (*)    Lymphs Abs 0.3 (*)    All other components within normal limits  URINALYSIS, ROUTINE W REFLEX MICROSCOPIC - Abnormal; Notable for the following:    Ketones, ur 5 (*)    Leukocytes, UA TRACE (*)    Squamous Epithelial / LPF 0-5 (*)    All other components within normal limits  BRAIN NATRIURETIC PEPTIDE - Abnormal; Notable for the following:    B Natriuretic Peptide 185.3 (*)    All other components within normal limits  URINE CULTURE  CULTURE, BLOOD (ROUTINE X 2)  CULTURE, BLOOD (ROUTINE X 2)  I-STAT CG4 LACTIC ACID, ED  I-STAT TROPOININ, ED    EKG  EKG Interpretation  Date/Time:  Tuesday April 15 2016 09:46:19 EST Ventricular Rate:  75 PR Interval:    QRS Duration: 89 QT Interval:  380 QTC Calculation: 425 R Axis:   80 Text Interpretation:  Sinus rhythm Prolonged PR interval Low voltage, extremity leads No significant change since last tracing Confirmed by [facility] MD, PEDRO ([id number]) on 04/15/2016 9:50:47 AM Also confirmed by [facility]  MD, PEDRO [phone]), editor Rolla Plate, Joelene Millin [phone])  on  04/15/2016 11:08:25 AM       Radiology No results found. CT Abdomen Pelvis W Contrast (Final result)  Result time 04/15/16 15:02:57  Final result by Sabino Dick, MD (04/15/16 15:02:57)           Narrative:   CLINICAL DATA: Generalized abdominal pain.  EXAM: CT ABDOMEN AND PELVIS WITH CONTRAST  TECHNIQUE: Multidetector CT imaging of the abdomen and pelvis was performed using the standard protocol following bolus administration of intravenous contrast.  CONTRAST: 100 mL of Isovue-300 intravenously.  COMPARISON: CT scan of April 13, 2016.  FINDINGS: Lower chest: Mild bibasilar subsegmental atelectasis is noted.  Hepatobiliary: Small gallstone is noted. Liver is unremarkable.  Pancreas: Unremarkable. No pancreatic ductal dilatation or surrounding inflammatory changes.  Spleen: Normal in size without focal abnormality.  Adrenals/Urinary Tract: Adrenal glands appear normal. Left parapelvic cysts are noted. No hydronephrosis or renal obstruction is noted. Urinary bladder appears normal. No renal or ureteral calculi are noted.  Stomach/Bowel: Diverticulosis of descending and sigmoid colon is noted. Inflammatory changes seen involving proximal descending colon are significantly improved compared to prior exam, with only minimal spur residual inflammatory stranding present. There is no evidence of bowel obstruction.  Vascular/Lymphatic: Aortic atherosclerosis. No enlarged abdominal or pelvic lymph nodes.  Reproductive: Status post hysterectomy. No adnexal masses.  Other: No abdominal wall hernia or abnormality. No abdominopelvic ascites.  Musculoskeletal: Moderate degenerative disc disease is noted at L4-5.  IMPRESSION: Small gallstone is noted.  Aortic atherosclerosis.  Diverticulitis involving proximal descending colon is significantly improved compared to prior exam, with minimal inflammatory  stranding remaining.   Electronically Signed By: Marijo Conception, M.D. On: 04/15/2016 15:02            DG Chest 2 View (Final result)  Result time 04/15/16 11:34:09  Final result by Nelson Chimes, MD (04/15/16 11:34:09)           Narrative:   CLINICAL DATA: Intermittent right-sided chest pain. Abdominal infection.  EXAM: CHEST 2 VIEW  COMPARISON: 02/28/2016. 11/23/2015. 07/25/2014.  FINDINGS: There is chronic left ventricular enlargement. There is aortic atherosclerosis. There is chronic pleural and parenchymal scarring on the left, presumably secondary to old chest trauma. There is abnormal venous hypertension with interstitial edema and a small amount of fluid in the fissures. No acute bone finding.  IMPRESSION: Chronic pleural and parenchymal scarring on the left. Probable fluid overload/ mild congestive heart failure superimposed.  Aortic atherosclerosis.   Electronically Signed By: Nelson Chimes M.D. On: 04/15/2016 11:34          Procedures Procedures (including critical care time)  Medications Ordered in ED Medications  sodium chloride 0.9 % bolus 1,000 mL (0 mLs Intravenous Stopped 04/15/16 1324)  acetaminophen (TYLENOL) tablet 650 mg (650 mg Oral Given 04/15/16 1053)  iopamidol (ISOVUE-300) 61 % injection (100 mLs  Contrast Given 04/15/16 1424)     Initial Impression / Assessment and Plan / ED Course  I have reviewed the triage vital signs and the nursing notes.  Pertinent labs & imaging results that were available during my care of the patient were reviewed by me and considered in my medical decision making (see chart for details).  Clinical Course     1. abd pain Improving UTI and diverticulitis. No other sources on CT abd. Pt able to tolerate PO challenge.   2. Chest pain. EKG without acute ischemic changes or evidence of pericarditis. Chest x-ray without evidence suggestive of pneumonia, pneumothorax, pneumomediastinum.  No  abnormal contour of the mediastinum to  suggest dissection. No evidence of acute injuries. Did reveal some vascular congestion. Exam and labs not consistent with CHF exacerbation. Trop negative. Given the highly atypical nature of the pain, which is most consistent with chest wall pain, I feel that this is sufficient to r/o ACS. Low suspicion for PE. Not classic for aortic dissection or esophageal perforation.   The patient is safe for discharge with strict return precautions.  Final Clinical Impressions(s) / ED Diagnoses   Final diagnoses:  Acute cystitis without hematuria  Diverticulitis of large intestine without perforation or abscess without bleeding  Chest wall tenderness   Disposition: Discharge  Condition: Good  I have discussed the results, Dx and Tx plan with the patient and duaghter who expressed understanding and agree(s) with the plan. Discharge instructions discussed at great length. The patient and daughter was given strict return precautions who verbalized understanding of the instructions. No further questions at time of discharge.    Follow Up: Marin Olp, MD [place] [id number] [phone]  Schedule an appointment as soon as possible for a visit  in 5-7 days      Fatima Blank, MD 04/17/16 [phone]

## 2016-04-15 NOTE — ED Triage Notes (Addendum)
Per EMS, pt is from [place]. Pt called 911 and staff called daughter because she is power of attorney. Pt was recently seen on Sunday for diverticulitis and UTI. Pt has been taking antibiotics since discharge yesterday 12/4. Pt c/o generalized abdominal pain that was relieved by a bowel movement. Pt c/o of right sided intermittent chest pain that is tender to touch and movement. Pt takes a daily dose of aspirin and uses 2L of O2 at night. EMS reports that pt was febrile. EMS states that pt has delayed responses but is alert and oriented for EMS and has a hx of alzheimer's. Pt also HOH.

## 2016-04-15 NOTE — ED Notes (Signed)
Bed: WA03 Expected date:  Expected time:  Means of arrival:  Comments: EMS- 82yo F, abdominal pain/R side chest pain

## 2016-04-15 NOTE — ED Notes (Signed)
1st blood culture collected at 1050 and 2nd blood culture collected at 1056.

## 2016-04-16 LAB — URINE CULTURE: Culture: NO GROWTH

## 2016-04-17 ENCOUNTER — Encounter: Payer: Self-pay | Admitting: Family Medicine

## 2016-04-17 ENCOUNTER — Ambulatory Visit (INDEPENDENT_AMBULATORY_CARE_PROVIDER_SITE_OTHER): Payer: Medicare Other | Admitting: Family Medicine

## 2016-04-17 ENCOUNTER — Telehealth: Payer: Self-pay | Admitting: Family Medicine

## 2016-04-17 VITALS — BP 122/78 | HR 85 | Resp 12 | Ht 62.0 in

## 2016-04-17 DIAGNOSIS — R2681 Unsteadiness on feet: Secondary | ICD-10-CM | POA: Diagnosis not present

## 2016-04-17 DIAGNOSIS — I5189 Other ill-defined heart diseases: Secondary | ICD-10-CM

## 2016-04-17 DIAGNOSIS — R29898 Other symptoms and signs involving the musculoskeletal system: Secondary | ICD-10-CM | POA: Diagnosis not present

## 2016-04-17 DIAGNOSIS — I519 Heart disease, unspecified: Secondary | ICD-10-CM | POA: Diagnosis not present

## 2016-04-17 DIAGNOSIS — F0280 Dementia in other diseases classified elsewhere without behavioral disturbance: Secondary | ICD-10-CM

## 2016-04-17 DIAGNOSIS — G301 Alzheimer's disease with late onset: Secondary | ICD-10-CM | POA: Diagnosis not present

## 2016-04-17 DIAGNOSIS — R0789 Other chest pain: Secondary | ICD-10-CM

## 2016-04-17 DIAGNOSIS — N393 Stress incontinence (female) (male): Secondary | ICD-10-CM | POA: Diagnosis not present

## 2016-04-17 DIAGNOSIS — K5792 Diverticulitis of intestine, part unspecified, without perforation or abscess without bleeding: Secondary | ICD-10-CM | POA: Diagnosis not present

## 2016-04-17 DIAGNOSIS — R2689 Other abnormalities of gait and mobility: Secondary | ICD-10-CM | POA: Diagnosis not present

## 2016-04-17 DIAGNOSIS — M6281 Muscle weakness (generalized): Secondary | ICD-10-CM | POA: Diagnosis not present

## 2016-04-17 DIAGNOSIS — R260 Ataxic gait: Secondary | ICD-10-CM | POA: Diagnosis not present

## 2016-04-17 NOTE — Progress Notes (Signed)
HPI:  ACUTE VISIT:  Chief Complaint  Patient presents with  . Hospitalization Follow-up    [name].[name] [name] is a 82 y.o. female, who is here today with her daughter to follow on recent hospitalization and ER visit.  [name] [name] is a 82 yo with Hx of dementia, diastolic CHF, recurrent UTI, urine incontinence, and COPD on supplemental O2 among some who was hospitalized recently , 04/13/16 to 04/14/16. She was taken to ER via ambulance because temp 100.9 F and weakness. In the ER noted abdominal tenderness on examination. She had just completed Keflex for another UTI.  Dx with diverticulitis and UTI. She was treated with IV abx: Rocephin and Flagyl  and discharged on 04/14/16 on Metronidazole and Ceftin, both she is still taking and has tolerated well.  On 04/15/16 she called her daughter c/o feeling "very sick and weak" ,fever 100.1 F, and chest pain. She thought she was having a "heart attack" , daughter called ambulance and she was taking to [facility]. According to daughter EKG was done and she was Dx with  "diastolic heart failure.", she was discharged after work-up was done, including abdominal CT. She has not noted orthopnea or PND.    Lab Results  Component Value Date   WBC 17.1 (H) 04/15/2016   HGB 13.2 04/15/2016   HCT 39.1 04/15/2016   MCV 84.8 04/15/2016   PLT 331 04/15/2016    Lab Results  Component Value Date   CREATININE 0.90 04/15/2016   BUN 10 04/15/2016   NA 136 04/15/2016   K 3.9 04/15/2016   CL 100 (L) 04/15/2016   CO2 27 04/15/2016     CT abdomen done 12/3 and 12/5   04/15/16: Small gallstone is noted. Aortic atherosclerosis. Diverticulitis involving proximal descending colon is significantly improved compared to prior exam, with minimal inflammatory remaining.    Abdominal CT 04/13/16 Mild diverticulitis involving the proximal descending colon. No evidence of abscess or other complication. Cholelithiasis. No radiographic evidence of  cholecystitis. Aortic atherosclerosis.   She is still c/o chest pain, according to daughter, she is "convenced she had pneumonia.She states that pain is  "all across" her chest, she places hand on left breast every time she refers to her pain. Pain is constant, no radiated, and exacerbated by deep breathing, she feels like pain is getting worse. Some shortness of breath but no more wheezing than usual and no diaphoresis. Daughter has not noted [name] changes.   She has not had abdominal pain or fever since yesterday. Last bowel movement today, no blood noted. No nausea or vomiting.   -Her daughter tells me that she has been on abx intermittently since 02/2016 for UTI but it keeps coming back. She denies dysuria, gross hematuria; She has history of urine incontinence and wears pull ups.  She lives in independent living, according to daughter, she needs "a lot of assistance" but she is "very happy" where she is now and she does not want to move her to assistant living. She wonders if Medicare would cover an aid to help with ADLs and IADLs at home.  CXR 04/15/16: Chronic pleural and parenchymal scarring on the left. Probable fluid overload/ mild congestive heart failure superimposed.   Reviewing her records I noted a abnormal mammogram 01/2016 ([facility]) and recent left breast biopsy, she was also having brownish nipple discharged. Hx of right breast cancer 2009. According to daughter, breast Bx was "ok."   Review of Systems  Constitutional: Positive for fatigue. Negative  for activity change, appetite change and unexpected weight change.  HENT: Negative for mouth sores, nosebleeds and trouble swallowing.   Respiratory: Positive for shortness of breath. Negative for cough.   Cardiovascular: Positive for chest pain. Negative for palpitations.  Gastrointestinal: Negative for abdominal pain, diarrhea, nausea and vomiting.  Genitourinary: Negative for decreased urine volume, dysuria and hematuria.   Skin: Negative for rash.  Neurological: Negative for syncope, weakness and headaches.  Psychiatric/Behavioral: Negative for confusion and hallucinations. The patient is nervous/anxious.       Current Outpatient Prescriptions on File Prior to Visit  Medication Sig Dispense Refill  . albuterol (PROVENTIL HFA;VENTOLIN HFA) 108 (90 Base) MCG/ACT inhaler Inhale 2 puffs into the lungs every 6 (six) hours as needed for wheezing or shortness of breath. 1 Inhaler 0  . aspirin 325 MG tablet Take 325 mg by mouth at bedtime.     . budesonide-formoterol (SYMBICORT) 160-4.5 MCG/ACT inhaler Inhale 2 puffs into the lungs 2 (two) times daily. 1 Inhaler 12  . cefUROXime (CEFTIN) 500 MG tablet Take 1 tablet (500 mg total) by mouth 2 (two) times daily with a meal. 13 tablet 0  . diazepam (VALIUM) 5 MG tablet TAKE 1 MG BY MOUTH AT BEDTIME (Patient taking differently: Take 5 mg by mouth at bedtime. ) 30 tablet 2  . donepezil (ARICEPT) 10 MG tablet Take 1/2 tablet daily for 1 month, then increase to 1 tablet daily (Patient taking differently: Take 10 mg by mouth at bedtime. ) 30 tablet 11  . fluticasone (FLONASE) 50 MCG/ACT nasal spray Place 2 sprays into both nostrils daily. (Patient taking differently: Place 2 sprays into both nostrils at bedtime. )    . metroNIDAZOLE (FLAGYL) 500 MG tablet Take 1 tablet (500 mg total) by mouth every 8 (eight) hours. 19 tablet 0  . nitroGLYCERIN (NITROSTAT) 0.4 MG SL tablet Place 1 tablet (0.4 mg total) under the tongue every 5 (five) minutes as needed for chest pain (take as directed). 25 tablet 3  . ondansetron (ZOFRAN) 4 MG tablet Take 1 tablet (4 mg total) by mouth every 8 (eight) hours as needed for nausea or vomiting. 20 tablet 0   No current facility-administered medications on file prior to visit.      Past Medical History:  Diagnosis Date  . Airway obstruction   . Allergic rhinitis   . Anal stricture   . ANXIETY 08/16/2008  . Atrial fibrillation ([place])    she  denies known hx of afib 02/24/13  . CAD, UNSPECIFIED SITE 10/17/2008  . Candida esophagitis ([place])   . CHOLELITHIASIS 08/23/2008  . Chronic constipation   . Colitis    sigmoid  . Complication of anesthesia    difficult intubation  . COPD 01/04/2010   FeV1 101%, DLCO64% 2008  . COPD (chronic obstructive pulmonary disease) ([place]) 01/04/2010  . 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 Diverticulitis   . DIVERTICULOSIS-COLON 04/07/2008  . Emphysema of lung ([place])   . Esophageal stricture   . Fatty liver   . GERD 04/19/2007  . Hearing aid worn    bilateral  . Hiatal hernia   . History of diverticulitis of colon   . HYPERSOMNIA, ASSOCIATED WITH SLEEP APNEA 02/01/2007  . HYPERTENSION 11/12/2006  . IBS (irritable bowel syndrome)   . MITRAL VALVE PROLAPSE, HX OF 10/07/2008  . NEOPLASM, MALIGNANT, BREAST, RIGHT 02/15/2008  . OSTEOPOROSIS 04/04/2008  . Pulmonary fibrosis ([place])   . Raynaud's syndrome 04/25/2009  . Sleep apnea   . Tortuous colon   .  UTI (urinary tract infection)    Allergies  Allergen Reactions  . Azithromycin Hives, Diarrhea, Dermatitis and Rash  . Ciprofloxacin Swelling  . Levaquin [Levofloxacin] Hives  . Penicillins Hives    has had mild doses that she did not have reactions to  Has patient had a PCN reaction causing immediate rash, facial/tongue/throat swelling, SOB or lightheadedness with hypotension:Yes Has patient had a PCN reaction causing severe rash involving mucus membranes or skin necrosis: No Has patient had a PCN reaction that required hospitalization: no Has patient had a PCN reaction occurring within the last 10 years: no If all of the above answers are "NO", then may proceed with Cephalosporin use.   . Sulfamethoxazole Nausea And Vomiting    Social History   Social History  . Marital status: Widowed    Spouse name: N/A  . Number of children: 3  . Years of education: N/A   Occupational History  . retireed Retired   Social History Main  Topics  . Smoking status: Former Smoker    Packs/day: 2.00    Years: 30.00    Types: Cigarettes    Quit date: 05/13/1983  . Smokeless tobacco: Never Used  . Alcohol use 0.0 oz/week     Comment: 1-2 glaases wine per day  . Drug use: No  . Sexual activity: No   Other Topics Concern  . None   Social History Narrative   Living in assisted living at [facility]. Widowed 1991. 3 children. 7 grandkids. No greatgrandkids.       Walks down for her meals with a walker.    ADL-bath, clothe, cleaning   IADL-daughter does finances      HCPOA- daughter Janan Ridge   Full code for now- have counseled and advised consider DNR/DNI   States enjoys living and would want resuscitation currently      [place] [facility]:   Patient is originally from [place]. Has traveled to nearly ever one of the [place] states. She has prior travel to [place] & the [place]. Previously enjoyed gardening. 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 Remote bird exposure to a canary as a child. Previously had bird houses in her yard that she cleaned out.     Vitals:   04/17/16 1620  BP: 122/78  Pulse: 85  Resp: 12   There is no height or weight on file to calculate BMI.   Physical Exam  Nursing note and vitals reviewed. Constitutional: She appears well-developed. She does not appear ill. No distress.  HENT:  Head: Atraumatic.  Mouth/Throat: Oropharynx is clear and moist and mucous membranes are normal.  Eyes: Conjunctivae and EOM are normal.  Cardiovascular: Normal rate and regular rhythm.   No murmur heard. Respiratory: Effort normal and breath sounds normal. No respiratory distress. She exhibits tenderness (upon palpation and with deep breathing).  Left chondrocostal joints, 5-7th.  GI: Soft. There is no tenderness.  Musculoskeletal: She exhibits no edema.  Lymphadenopathy:    She has no cervical adenopathy.  Neurological: She is alert.  She is in a wheel chair  Skin: Skin is  warm. No erythema.  Psychiatric: Her mood appears anxious. She exhibits abnormal recent memory.  Well groomed, good eye contact.      ASSESSMENT AND PLAN:     Elise was seen today for hospitalization follow-up.  Diagnoses and all orders for this visit:   Diverticulitis of intestine without perforation or abscess without bleeding, unspecified part of intestinal tract  Clinically she is improving. Complete abx treatment. Because daughter reports frequent abx use for the past couple months, I recommend starting a daily Probiotic. F/U in 10-14 days, before if needed.  Chest wall pain  Chest pain seems musculoskeletal. We also discussed the possibility of been related with left breast pathology. Recommend avoiding shallow breathing. I do not think imaging is needed today.  Diastolic dysfunction  We discussed echo she had done in 02/2016: LVEF 60% to [id number], grade 2 diastolic dysfunction. Daughter also educated abut physiopathology of disease. Low salt intake. BNP 04/15/16 was elevated at 185, not significant.   Late onset Alzheimer's disease without behavioral disturbance  Moderate base on examination today. During OV she frequently repeat same phrase and after she is reassured and satisfied with plan she goes to the beginning expressing concerns about her heart. Prognosis of disease discussed. She recently underwent mammogram and breast Bx, reported as Ok by daughter;I encourage daughter to make a decision about how invasive she wants to go in regard to work-up and/or treatment. Eventually she is not going to be able to live alone in independent living, we discussed Medicare coverage in regard to [facility] services.   Return in about 2 weeks (around 05/01/2016) for PCP.     -[name].Foye Wichert's daughter was advised to return or notify a doctor immediately if symptoms worsen or new concerns arise.       Betty G. Martinique, MD  [facility]. [place]  office.

## 2016-04-17 NOTE — Patient Instructions (Addendum)
  [name] I have seen you today for an acute visit.  1. Diverticulitis of intestine without perforation or abscess without bleeding, unspecified part of intestinal tract   2. Chest wall pain   It does not seem heart and no findings that suggest pneumonia. Complete antibiotics. Align 1 cap daily.  Deep breathing a few times per day   Fall precautions.  In general please monitor for signs of worsening symptoms and seek immediate medical attention if any concerning/warning symptom as we discussed.     Please be sure you have an appointment already scheduled with your PCP before you leave today.

## 2016-04-17 NOTE — Telephone Encounter (Signed)
Patient Name: [name] [name] DOB: January 25, 1924 Initial Comment caller states mother c/o chest soreness Nurse Assessment Nurse: Andria Frames, RN, Aeriel Date/Time (Eastern Time): 04/17/2016 11:43:54 AM Confirm and document reason for call. If symptomatic, describe symptoms. ---Caller states, pt is having some chest soreness and achiness. Pt was in the hospital and released on Tuesday. She was diagnosed with chronic UTI and diverticulitis. She is on ceftin 500 mg BID Flagyl 500mg  3 times a day. Caller states, pt had it when she was in the hospital to. They did do a chest x ray in the hospital. She has diastolic heart failure. She does not have any pneumonia. They are supposed to get back in to the doctor for a follow up. Caller states, pt is not getting any worse. She is up and dressed and they are waiting on the physical therapist. Caller states, it is not any better or worse since leaving the hospital. Caller states, she went to the hospital twice the first time she went one of the EMS guys lifted her up she wonders if that has anything to do with it being sore. Does the patient have any new or worsening symptoms? ---No Please document clinical information provided and list any resource used. ---Caller is wanting a follow up appt and wants someone to listen to the pt chest. Nurse is contacting the office to see if they can assist her with follow up. Guidelines Guideline Title Affirmed Question Affirmed Notes Final Disposition User Comments Nurse contacted the office. Appt made for 415 today via [place]. Nurse to inform caller pt has an appt at 415 if she has any new or worsening symptoms to either go straight to the ED or call us back.

## 2016-04-17 NOTE — Progress Notes (Signed)
Pre visit review using our clinic review tool, if applicable. No additional management support is needed unless otherwise documented below in the visit note. 

## 2016-04-18 DIAGNOSIS — R2681 Unsteadiness on feet: Secondary | ICD-10-CM | POA: Diagnosis not present

## 2016-04-18 DIAGNOSIS — R29898 Other symptoms and signs involving the musculoskeletal system: Secondary | ICD-10-CM | POA: Diagnosis not present

## 2016-04-18 DIAGNOSIS — N393 Stress incontinence (female) (male): Secondary | ICD-10-CM | POA: Diagnosis not present

## 2016-04-18 DIAGNOSIS — R2689 Other abnormalities of gait and mobility: Secondary | ICD-10-CM | POA: Diagnosis not present

## 2016-04-18 DIAGNOSIS — R260 Ataxic gait: Secondary | ICD-10-CM | POA: Diagnosis not present

## 2016-04-18 DIAGNOSIS — M6281 Muscle weakness (generalized): Secondary | ICD-10-CM | POA: Diagnosis not present

## 2016-04-18 LAB — CULTURE, BLOOD (ROUTINE X 2)
CULTURE: NO GROWTH
CULTURE: NO GROWTH

## 2016-04-20 LAB — CULTURE, BLOOD (ROUTINE X 2)
Culture: NO GROWTH
Culture: NO GROWTH

## 2016-04-21 DIAGNOSIS — N393 Stress incontinence (female) (male): Secondary | ICD-10-CM | POA: Diagnosis not present

## 2016-04-21 DIAGNOSIS — R29898 Other symptoms and signs involving the musculoskeletal system: Secondary | ICD-10-CM | POA: Diagnosis not present

## 2016-04-21 DIAGNOSIS — R260 Ataxic gait: Secondary | ICD-10-CM | POA: Diagnosis not present

## 2016-04-21 DIAGNOSIS — R2689 Other abnormalities of gait and mobility: Secondary | ICD-10-CM | POA: Diagnosis not present

## 2016-04-21 DIAGNOSIS — R2681 Unsteadiness on feet: Secondary | ICD-10-CM | POA: Diagnosis not present

## 2016-04-21 DIAGNOSIS — M6281 Muscle weakness (generalized): Secondary | ICD-10-CM | POA: Diagnosis not present

## 2016-04-22 DIAGNOSIS — N393 Stress incontinence (female) (male): Secondary | ICD-10-CM | POA: Diagnosis not present

## 2016-04-22 DIAGNOSIS — R260 Ataxic gait: Secondary | ICD-10-CM | POA: Diagnosis not present

## 2016-04-22 DIAGNOSIS — R2689 Other abnormalities of gait and mobility: Secondary | ICD-10-CM | POA: Diagnosis not present

## 2016-04-22 DIAGNOSIS — R29898 Other symptoms and signs involving the musculoskeletal system: Secondary | ICD-10-CM | POA: Diagnosis not present

## 2016-04-22 DIAGNOSIS — M6281 Muscle weakness (generalized): Secondary | ICD-10-CM | POA: Diagnosis not present

## 2016-04-22 DIAGNOSIS — R2681 Unsteadiness on feet: Secondary | ICD-10-CM | POA: Diagnosis not present

## 2016-04-23 DIAGNOSIS — M6281 Muscle weakness (generalized): Secondary | ICD-10-CM | POA: Diagnosis not present

## 2016-04-23 DIAGNOSIS — R260 Ataxic gait: Secondary | ICD-10-CM | POA: Diagnosis not present

## 2016-04-23 DIAGNOSIS — R2689 Other abnormalities of gait and mobility: Secondary | ICD-10-CM | POA: Diagnosis not present

## 2016-04-23 DIAGNOSIS — R29898 Other symptoms and signs involving the musculoskeletal system: Secondary | ICD-10-CM | POA: Diagnosis not present

## 2016-04-23 DIAGNOSIS — R2681 Unsteadiness on feet: Secondary | ICD-10-CM | POA: Diagnosis not present

## 2016-04-23 DIAGNOSIS — N393 Stress incontinence (female) (male): Secondary | ICD-10-CM | POA: Diagnosis not present

## 2016-04-24 ENCOUNTER — Encounter: Payer: Self-pay | Admitting: Family Medicine

## 2016-04-24 ENCOUNTER — Ambulatory Visit (INDEPENDENT_AMBULATORY_CARE_PROVIDER_SITE_OTHER): Payer: Medicare Other | Admitting: Family Medicine

## 2016-04-24 ENCOUNTER — Ambulatory Visit (INDEPENDENT_AMBULATORY_CARE_PROVIDER_SITE_OTHER)
Admission: RE | Admit: 2016-04-24 | Discharge: 2016-04-24 | Disposition: A | Discharge status: Home / Self Care | Payer: Medicare Other | Source: Ambulatory Visit | Attending: Family Medicine | Admitting: Family Medicine

## 2016-04-24 ENCOUNTER — Telehealth: Payer: Self-pay | Admitting: Family Medicine

## 2016-04-24 VITALS — BP 112/80 | HR 70 | Temp 98.5°F | Ht 62.0 in | Wt 114.2 lb

## 2016-04-24 DIAGNOSIS — R079 Chest pain, unspecified: Secondary | ICD-10-CM

## 2016-04-24 DIAGNOSIS — N390 Urinary tract infection, site not specified: Secondary | ICD-10-CM

## 2016-04-24 DIAGNOSIS — R7989 Other specified abnormal findings of blood chemistry: Secondary | ICD-10-CM

## 2016-04-24 DIAGNOSIS — R260 Ataxic gait: Secondary | ICD-10-CM | POA: Diagnosis not present

## 2016-04-24 DIAGNOSIS — R2689 Other abnormalities of gait and mobility: Secondary | ICD-10-CM | POA: Diagnosis not present

## 2016-04-24 DIAGNOSIS — R29898 Other symptoms and signs involving the musculoskeletal system: Secondary | ICD-10-CM | POA: Diagnosis not present

## 2016-04-24 DIAGNOSIS — M6281 Muscle weakness (generalized): Secondary | ICD-10-CM | POA: Diagnosis not present

## 2016-04-24 DIAGNOSIS — N393 Stress incontinence (female) (male): Secondary | ICD-10-CM | POA: Diagnosis not present

## 2016-04-24 DIAGNOSIS — R2681 Unsteadiness on feet: Secondary | ICD-10-CM | POA: Diagnosis not present

## 2016-04-24 LAB — POC URINALSYSI DIPSTICK (AUTOMATED)
Bilirubin, UA: NEGATIVE
Blood, UA: NEGATIVE
Glucose, UA: NEGATIVE
NITRITE UA: NEGATIVE
Spec Grav, UA: 1.015
Urobilinogen, UA: 0.2
pH, UA: 6

## 2016-04-24 LAB — COMPREHENSIVE METABOLIC PANEL
ALK PHOS: 52 U/L (ref 39–117)
ALT: 5 U/L (ref 0–35)
AST: 13 U/L (ref 0–37)
Albumin: 3.7 g/dL (ref 3.5–5.2)
BILIRUBIN TOTAL: 0.6 mg/dL (ref 0.2–1.2)
BUN: 8 mg/dL (ref 6–23)
CO2: 35 meq/L — AB (ref 19–32)
CREATININE: 0.71 mg/dL (ref 0.40–1.20)
Calcium: 8.9 mg/dL (ref 8.4–10.5)
Chloride: 101 mEq/L (ref 96–112)
GFR: 82.12 mL/min (ref >60.00)
GLUCOSE: 107 mg/dL — AB (ref 70–99)
Potassium: 4.2 mEq/L (ref 3.5–5.1)
Sodium: 139 mEq/L (ref 135–145)
TOTAL PROTEIN: 6.1 g/dL (ref 6.0–8.3)

## 2016-04-24 NOTE — Telephone Encounter (Signed)
Spoke with pt's daughter and she states that pt has been eating small amounts, but is c/o extreme nausea and pain under her right ribs. She is mainly eating jello, Ensure and smoothies with protein. She is becoming very weak and losing weight. Per ALF her vitals are all stable. She is getting PT daily but they are afraid to push her due to her weakened state.   Pt's daughter is very concerned about pt's pain level and lack of intake.

## 2016-04-24 NOTE — Progress Notes (Signed)
Pre visit review using our clinic review tool, if applicable. No additional management support is needed unless otherwise documented below in the visit note. 

## 2016-04-24 NOTE — Telephone Encounter (Signed)
Spoke with Dr Yong Channel and he said she could come in today for evaluation.   Spoke with daughter and advised. She is already on her way to see pt, so she will bring her here ASAP. Told her not to rush.

## 2016-04-24 NOTE — Patient Instructions (Signed)
If worsening symptoms, likely need to go to the hospital  Would push fluids as much as possible and continue to encourage PO intake  Labs- if d-dimer is positive will call you to go to hospital to rule out blood clot in the lung  EKG with some movement but largely reassuring and story does not sound cardiac  Also check a urine with decreased intake  Please go to [facility] - located [address] across the street from [place] - in the basement - Hours: 8:30-5:30 PM M-F. Do not need appointment.

## 2016-04-24 NOTE — Progress Notes (Signed)
Subjective:  [name] [name] is a 82 y.o. year old very pleasant female patient who presents for/with See problem oriented charting ROS- some increased SOB, right sided chest pain. No fever, cough, congestion. .   Past Medical History-  Patient Active Problem List   Diagnosis Date Noted  . Syncope 02/23/2016    Priority: High  . Alzheimer's dementia 04/12/2014    Priority: High  . History of CVA (cerebrovascular accident) 07/29/2013    Priority: High  . Pulmonary nodules 07/29/2013    Priority: High  . COPD with emphysema Gold B 01/04/2010    Priority: High  . Diastolic dysfunction 0000000    Priority: Medium  . Insomnia 11/21/2013    Priority: Medium  . Recurrent UTI 07/13/2013    Priority: Medium  . History of breast cancer, T1b, N0, Lumpectomy 07/21/2007. 04/21/2011    Priority: Medium  . Essential hypertension 11/12/2006    Priority: Medium  . Allergic rhinitis 04/12/2014    Priority: Low  . Constipation 04/12/2014    Priority: Low  . Osteoarthritis, knee 04/12/2014    Priority: Low  . Raynaud's syndrome 04/25/2009    Priority: Low  . GERD 04/19/2007    Priority: Low  . UTI (urinary tract infection) 04/13/2016  . Diverticulitis 04/13/2016  . Sepsis ([place]) 04/13/2016  . Abdominal aortic atherosclerosis ([place]) 03/08/2016    Medications- reviewed and updated Current Outpatient Prescriptions  Medication Sig Dispense Refill  . albuterol (PROVENTIL HFA;VENTOLIN HFA) 108 (90 Base) MCG/ACT inhaler Inhale 2 puffs into the lungs every 6 (six) hours as needed for wheezing or shortness of breath. 1 Inhaler 0  . aspirin 325 MG tablet Take 325 mg by mouth at bedtime.     . budesonide-formoterol (SYMBICORT) 160-4.5 MCG/ACT inhaler Inhale 2 puffs into the lungs 2 (two) times daily. 1 Inhaler 12  . diazepam (VALIUM) 5 MG tablet TAKE 1 MG BY MOUTH AT BEDTIME (Patient taking differently: Take 5 mg by mouth at bedtime. ) 30 tablet 2  . donepezil (ARICEPT) 10 MG tablet Take 1/2 tablet  daily for 1 month, then increase to 1 tablet daily (Patient taking differently: Take 10 mg by mouth at bedtime. ) 30 tablet 11  . fluticasone (FLONASE) 50 MCG/ACT nasal spray Place 2 sprays into both nostrils daily. (Patient taking differently: Place 2 sprays into both nostrils at bedtime. )    . nitroGLYCERIN (NITROSTAT) 0.4 MG SL tablet Place 1 tablet (0.4 mg total) under the tongue every 5 (five) minutes as needed for chest pain (take as directed). 25 tablet 3  . ondansetron (ZOFRAN) 4 MG tablet Take 1 tablet (4 mg total) by mouth every 8 (eight) hours as needed for nausea or vomiting. 20 tablet 0   No current facility-administered medications for this visit.     Objective: BP 112/80 (BP Location: Left Arm, Patient Position: Sitting, Cuff Size: Normal)   Pulse 70   Temp 98.5 F (36.9 C) (Oral)   Ht 5\' 2"  (1.575 m)   Wt 114 lb 3.2 oz (51.8 kg)   SpO2 93%   BMI 20.89 kg/m  Gen: NAD, resting comfortably, intermittently falls asleep, depends on daughter for interpretation of conversation when awake TM normal, orpharynx normal and moist, nares normal CV: RRR no murmurs rubs or gallops Lungs: CTAB no crackles, wheeze, rhonchi Abdomen: soft/nontender/nondistended/normal bowel sounds. No rebound or guarding.  Ext: no edema (recent diverticulitis) Skin: warm, dry, no rash Neuro: grossly normal, moves all extremities, in wheelchair today  EKG: HR 55, 1st degree  block, normal axis, prolonged pr, normal qt and qrs interval, no hypertrophy, no obvious st or t wave changes but a lot of movement on exam.   Dg Chest 2 View  Result Date: 04/24/2016 CLINICAL DATA:  Right anterior chest pain EXAM: CHEST  2 VIEW COMPARISON:  04/15/16 FINDINGS: Cardiomediastinal stress that cardiomegaly again noted. Hyperinflation again noted. There is chronic pleural thickening and scarring in left lower hemithorax with blunting of the costophrenic angle. Stable linear scarring left perihilar. Old left rib fracture. No  definite superimposed infiltrate or pulmonary edema. Atherosclerotic calcifications of thoracic aorta. Thoracic spine osteopenia. IMPRESSION: Hyperinflation again noted. There is chronic pleural thickening and scarring in left lower hemithorax with blunting of the costophrenic angle. Stable linear scarring left perihilar. Old left rib fracture. No definite superimposed infiltrate or pulmonary edema. Electronically Signed   By: Lahoma Crocker M.D.   On: 04/24/2016 14:01  I independently reviewed x-ray  Assessment/Plan:  Chest pain, unspecified type - Plan: EKG 12-Lead, D-dimer, Quantitative, CBC with Differential/Platelet, Comprehensive metabolic panel, DG Chest 2 View Recurrent UTI - Plan: POCT Urinalysis Dipstick (Automated), Urine culture S:Patient hospitalized in October for UTI and took about a month to turn around after UTI. She was finally recovering when Sunday December 3rd- developeddiverticulitis and UTI. On antibiotic 2 week course ceftin (flagyl)and flagyl (and diverticulitis). Of note urine culture showed no growth at that time. Went home and then went back to [facility] and kept for about 5 hours- went in for chest pain. Did CXR and echocardiogram and another scan of abdomen which showed improveement in j.   Home about 10 days. - slowly has stopped eating. Zofran not helping much. States always nauseated- keeps green plastic bag with her but has never thrown up. Takes 2-3 bites and stops. Lost 6-7 lbs since October. Weaker. Using walking to get to bathroom. Sleeps constantly. When she wakes up she is more confused than normal (slow progression) . Before this decline had been getting down to dining hall.   Her biggest complaint outside of nausea in this time has been that her Chest hurts on right side near axilla which is worst and also hurts closer to center of chest but on the right side. Hurts all the time worse with deep breaths. More short of breath than normal. Copd at baseline but worse  than that. Wont work with PT/OT. Sleeping way more than normal. Seems slightly more confused. History recurrent UTIs and sometimes mentation worsens  Of note patient has had echocardogram in October which showed mild diastolic dysfunciton with EF 60%.   A/P: right sided pleuritic chest pain- doubt this is cardiac and EKG reasonably reassuring. Does have chest wall pain and could be costochondritis but do not think this would explain her decline otherwise. Will get D-dimer to rule out PE (suspect may need CTA). I was concerned about pneumonia. Chest x-ray today which I independently reviewed without signs of pneumonia- some chronic changes on left side of lungs, no obvious fluid overloaded. Labs pending as below. UA not overly impressive and patient with minimal symptoms and last UA's similar but culture with no growth. Could have included a BNP but is not fluid overloaded on exam and weigh trending down not up. With more confusion stroke could be possible but no obvious neuro deficits reported  I was honest with daughter that I am worried about her mother's decline. I even mentioned the possibility of hospice (now 2 hospitalizations within 2 months and 2 ED visits in  addition to that)  Schedule zofran. Depending on labs consider lasix trial though no clear fluid overload and with poor PO would be concerned? Daughter asks about appetite stimulant but would avoid with her age and dementia. Discussed high probability of this ending in hospitalization again  Planned follow up 1 week as well  Orders Placed This Encounter  Procedures  . Urine culture    [facility]  . DG Chest 2 View    Standing Status:   Future    Number of Occurrences:   1    Standing Expiration Date:   06/25/2017    Order Specific Question:   Reason for Exam (SYMPTOM  OR DIAGNOSIS REQUIRED)    Answer:   right sided chest pain, known COPD but some increased SOB from baseline    Order Specific Question:   Preferred imaging location?     Answer:   [facility]  . D-dimer, Quantitative  . CBC with Differential/Platelet  . Comprehensive metabolic panel    [place]  . POCT Urinalysis Dipstick (Automated)  . EKG 12-Lead   The duration of face-to-face time during this visit was greater than 25 minutes. Greater than 50% of this time was spent in counseling about current illness in context of several preceeding illnesses, age, dementia. Also planning for workup- would want antibiotics, would want blood thinner if clot, would want catheterization potentially if heart related. Also discussing follow up if d dimer positive   Return precautions advised.  Garret Reddish, MD

## 2016-04-24 NOTE — Telephone Encounter (Signed)
Patient Name: [name] [name] DOB: Apr 07, 1924 Initial Comment Caller states her mother isn't eating at all for 10 days. She is complaining of severe achiness on right side of her chest. She was in hospital for diverticulitis and has finish her meds for that. She has history of COPD and is having chest discomfort (not chest pains, breathing is ok-O2 levels between 85-90). Nurse Assessment Nurse: Ronnald Ramp, RN, [place] Date/Time (Eastern Time): 04/24/2016 8:21:16 AM Confirm and document reason for call. If symptomatic, describe symptoms. ---Caller states her mother was in the hospital 2 weeks ago for UTI and Diverticulitis. She had MD follow up last week. The pt was c/o chest discomfort but the MD said her chest was clear and her heart sounded found, and she felt it was chest wall pain. Since leaving the hospital she has been c/o nausea anytime she tries to eat anything. She has not been eating and having increase weakness. Does the patient have any new or worsening symptoms? ---Yes Will a triage be completed? ---Yes Related visit to physician within the last 2 weeks? ---Yes Does the PT have any chronic conditions? (i.e. diabetes, asthma, etc.) ---Yes List chronic conditions. ---Dementia, COPD Is this a behavioral health or substance abuse call? ---No Guidelines Guideline Title Affirmed Question Affirmed Notes Nausea Nausea persists > 1 week Final Disposition User See PCP When Office is Open (within 3 days) Ronnald Ramp, RN, [place] would like to have her mother see Dr. Yong Channel. There are not appt available with him. Appt schedule with Dr. Martinique for tomorrow 12/15 at 10:15am. Caller would still like a message sent to Dr. Yong Channel to see if he can work the pt into his schedule to be seen. Disagree/Comply: Comply

## 2016-04-25 ENCOUNTER — Ambulatory Visit: Payer: Self-pay | Admitting: Family Medicine

## 2016-04-25 ENCOUNTER — Telehealth: Payer: Self-pay | Admitting: Family Medicine

## 2016-04-25 ENCOUNTER — Ambulatory Visit (INDEPENDENT_AMBULATORY_CARE_PROVIDER_SITE_OTHER)
Admission: RE | Admit: 2016-04-25 | Discharge: 2016-04-25 | Disposition: A | Discharge status: Home / Self Care | Payer: Medicare Other | Source: Ambulatory Visit | Attending: Family Medicine | Admitting: Family Medicine

## 2016-04-25 DIAGNOSIS — R7989 Other specified abnormal findings of blood chemistry: Secondary | ICD-10-CM | POA: Diagnosis not present

## 2016-04-25 DIAGNOSIS — I2699 Other pulmonary embolism without acute cor pulmonale: Secondary | ICD-10-CM | POA: Diagnosis not present

## 2016-04-25 DIAGNOSIS — R079 Chest pain, unspecified: Secondary | ICD-10-CM | POA: Diagnosis not present

## 2016-04-25 LAB — CBC WITH DIFFERENTIAL/PLATELET
BASOS ABS: 0.2 10*3/uL — AB (ref 0.0–0.1)
Basophils Relative: 2.5 % (ref 0.0–3.0)
EOS ABS: 0.1 10*3/uL (ref 0.0–0.7)
Eosinophils Relative: 1.2 % (ref 0.0–5.0)
HCT: 39.8 % (ref 36.0–46.0)
Hemoglobin: 13.4 g/dL (ref 12.0–15.0)
Lymphocytes Relative: 21.3 % (ref 12.0–46.0)
Lymphs Abs: 1.3 10*3/uL (ref 0.7–4.0)
MCHC: 33.6 g/dL (ref 30.0–36.0)
MCV: 84.8 fl (ref 78.0–100.0)
MONOS PCT: 7.2 % (ref 3.0–12.0)
Monocytes Absolute: 0.4 10*3/uL (ref 0.1–1.0)
NEUTROS ABS: 4.2 10*3/uL (ref 1.4–7.7)
Neutrophils Relative %: 67.8 % (ref 43.0–77.0)
PLATELETS: 275 10*3/uL (ref 150.0–400.0)
RBC: 4.69 Mil/uL (ref 3.87–5.11)
RDW: 15.2 % (ref 11.5–15.5)
WBC: 6.2 10*3/uL (ref 4.0–10.5)

## 2016-04-25 LAB — URINE CULTURE: Organism ID, Bacteria: NO GROWTH

## 2016-04-25 LAB — D-DIMER, QUANTITATIVE (NOT AT ARMC): D DIMER QUANT: 2.01 ug{FEU}/mL — AB (ref <0.50)

## 2016-04-25 MED ORDER — IOPAMIDOL (ISOVUE-370) INJECTION 76%
75.0000 mL | Freq: Once | INTRAVENOUS | Status: AC | PRN
  Administered 2016-04-25: 75 mL via INTRAVENOUS

## 2016-04-25 NOTE — Telephone Encounter (Signed)
Medication Samples have been provided to the patient.  Drug name: Xarelto      Strength: 15 & 20mg         Qty:51 tabs  LOT: [id number]  Exp.Date: 10-19  Dosing instructions: Days 1-21: 15mg  twice daily. Days 22-30 20mg  once daily  The patient has been instructed regarding the correct time, dose, and frequency of taking this medication, including desired effects and most common side effects.   Lonia Mad Southern Hizer 5:07 PM 04/25/2016

## 2016-04-25 NOTE — Addendum Note (Signed)
Addended by: Marin Olp on: 04/25/2016 10:08 AM   Modules accepted: Orders

## 2016-04-25 NOTE — Telephone Encounter (Signed)
Patient has Pulmonary Embolos in R Middle Lobe per Marzetta Board in [place].  [phone] is the number to Stacy at [facility] and she can transfer to the mobile phone.  Dr. Yong Channel aware.

## 2016-04-25 NOTE — Telephone Encounter (Signed)
See result note dated today

## 2016-04-28 ENCOUNTER — Observation Stay (HOSPITAL_COMMUNITY): Payer: Medicare Other

## 2016-04-28 ENCOUNTER — Ambulatory Visit (INDEPENDENT_AMBULATORY_CARE_PROVIDER_SITE_OTHER): Payer: Medicare Other | Admitting: Family Medicine

## 2016-04-28 ENCOUNTER — Encounter (HOSPITAL_COMMUNITY): Payer: Self-pay | Admitting: Family Medicine

## 2016-04-28 ENCOUNTER — Inpatient Hospital Stay (HOSPITAL_COMMUNITY)
Admission: AD | Admit: 2016-04-28 | Discharge: 2016-05-02 | DRG: 391 | Disposition: A | Discharge status: Skilled Nursing Facility | Payer: Medicare Other | Source: Ambulatory Visit | Attending: Internal Medicine | Admitting: Internal Medicine

## 2016-04-28 ENCOUNTER — Encounter: Payer: Self-pay | Admitting: Family Medicine

## 2016-04-28 VITALS — BP 98/62 | HR 88 | Temp 97.5°F | Ht 62.0 in | Wt 112.8 lb

## 2016-04-28 DIAGNOSIS — R531 Weakness: Secondary | ICD-10-CM | POA: Diagnosis not present

## 2016-04-28 DIAGNOSIS — I2510 Atherosclerotic heart disease of native coronary artery without angina pectoris: Secondary | ICD-10-CM | POA: Diagnosis present

## 2016-04-28 DIAGNOSIS — I10 Essential (primary) hypertension: Secondary | ICD-10-CM | POA: Diagnosis present

## 2016-04-28 DIAGNOSIS — Z8249 Family history of ischemic heart disease and other diseases of the circulatory system: Secondary | ICD-10-CM

## 2016-04-28 DIAGNOSIS — J8410 Pulmonary fibrosis, unspecified: Secondary | ICD-10-CM | POA: Diagnosis present

## 2016-04-28 DIAGNOSIS — Z880 Allergy status to penicillin: Secondary | ICD-10-CM

## 2016-04-28 DIAGNOSIS — F0280 Dementia in other diseases classified elsewhere without behavioral disturbance: Secondary | ICD-10-CM | POA: Diagnosis present

## 2016-04-28 DIAGNOSIS — R627 Adult failure to thrive: Secondary | ICD-10-CM | POA: Diagnosis present

## 2016-04-28 DIAGNOSIS — K222 Esophageal obstruction: Secondary | ICD-10-CM | POA: Diagnosis present

## 2016-04-28 DIAGNOSIS — Z515 Encounter for palliative care: Secondary | ICD-10-CM

## 2016-04-28 DIAGNOSIS — J438 Other emphysema: Secondary | ICD-10-CM | POA: Diagnosis not present

## 2016-04-28 DIAGNOSIS — G9341 Metabolic encephalopathy: Secondary | ICD-10-CM | POA: Diagnosis present

## 2016-04-28 DIAGNOSIS — Z87891 Personal history of nicotine dependence: Secondary | ICD-10-CM

## 2016-04-28 DIAGNOSIS — I700 Atherosclerosis of aorta: Secondary | ICD-10-CM | POA: Diagnosis present

## 2016-04-28 DIAGNOSIS — R2689 Other abnormalities of gait and mobility: Secondary | ICD-10-CM

## 2016-04-28 DIAGNOSIS — G309 Alzheimer's disease, unspecified: Secondary | ICD-10-CM | POA: Diagnosis present

## 2016-04-28 DIAGNOSIS — I2699 Other pulmonary embolism without acute cor pulmonale: Secondary | ICD-10-CM

## 2016-04-28 DIAGNOSIS — N390 Urinary tract infection, site not specified: Secondary | ICD-10-CM | POA: Diagnosis present

## 2016-04-28 DIAGNOSIS — G9340 Encephalopathy, unspecified: Secondary | ICD-10-CM | POA: Diagnosis not present

## 2016-04-28 DIAGNOSIS — K760 Fatty (change of) liver, not elsewhere classified: Secondary | ICD-10-CM | POA: Diagnosis present

## 2016-04-28 DIAGNOSIS — Z7189 Other specified counseling: Secondary | ICD-10-CM

## 2016-04-28 DIAGNOSIS — J439 Emphysema, unspecified: Secondary | ICD-10-CM | POA: Diagnosis present

## 2016-04-28 DIAGNOSIS — Z9011 Acquired absence of right breast and nipple: Secondary | ICD-10-CM

## 2016-04-28 DIAGNOSIS — G4700 Insomnia, unspecified: Secondary | ICD-10-CM | POA: Diagnosis present

## 2016-04-28 DIAGNOSIS — R109 Unspecified abdominal pain: Secondary | ICD-10-CM

## 2016-04-28 DIAGNOSIS — R110 Nausea: Secondary | ICD-10-CM | POA: Diagnosis not present

## 2016-04-28 DIAGNOSIS — Z974 Presence of external hearing-aid: Secondary | ICD-10-CM

## 2016-04-28 DIAGNOSIS — Z888 Allergy status to other drugs, medicaments and biological substances status: Secondary | ICD-10-CM

## 2016-04-28 DIAGNOSIS — J441 Chronic obstructive pulmonary disease with (acute) exacerbation: Secondary | ICD-10-CM

## 2016-04-28 DIAGNOSIS — H9190 Unspecified hearing loss, unspecified ear: Secondary | ICD-10-CM | POA: Diagnosis present

## 2016-04-28 DIAGNOSIS — F419 Anxiety disorder, unspecified: Secondary | ICD-10-CM | POA: Diagnosis present

## 2016-04-28 DIAGNOSIS — R918 Other nonspecific abnormal finding of lung field: Secondary | ICD-10-CM | POA: Diagnosis present

## 2016-04-28 DIAGNOSIS — K5792 Diverticulitis of intestine, part unspecified, without perforation or abscess without bleeding: Secondary | ICD-10-CM | POA: Diagnosis not present

## 2016-04-28 DIAGNOSIS — M6281 Muscle weakness (generalized): Secondary | ICD-10-CM

## 2016-04-28 DIAGNOSIS — K5732 Diverticulitis of large intestine without perforation or abscess without bleeding: Principal | ICD-10-CM | POA: Diagnosis present

## 2016-04-28 DIAGNOSIS — I341 Nonrheumatic mitral (valve) prolapse: Secondary | ICD-10-CM | POA: Diagnosis present

## 2016-04-28 DIAGNOSIS — K219 Gastro-esophageal reflux disease without esophagitis: Secondary | ICD-10-CM | POA: Diagnosis present

## 2016-04-28 DIAGNOSIS — Z87440 Personal history of urinary (tract) infections: Secondary | ICD-10-CM

## 2016-04-28 DIAGNOSIS — Z881 Allergy status to other antibiotic agents status: Secondary | ICD-10-CM

## 2016-04-28 LAB — BRAIN NATRIURETIC PEPTIDE: B Natriuretic Peptide: 23.6 pg/mL (ref 0.0–100.0)

## 2016-04-28 LAB — MAGNESIUM: Magnesium: 2 mg/dL (ref 1.7–2.4)

## 2016-04-28 LAB — COMPREHENSIVE METABOLIC PANEL
ALBUMIN: 3.5 g/dL (ref 3.5–5.0)
ALK PHOS: 44 U/L (ref 38–126)
ALT: 8 U/L — AB (ref 14–54)
ANION GAP: 8 (ref 5–15)
AST: 17 U/L (ref 15–41)
BILIRUBIN TOTAL: 1.1 mg/dL (ref 0.3–1.2)
BUN: 9 mg/dL (ref 6–20)
CALCIUM: 8.9 mg/dL (ref 8.9–10.3)
CO2: 27 mmol/L (ref 22–32)
CREATININE: 0.81 mg/dL (ref 0.44–1.00)
Chloride: 103 mmol/L (ref 101–111)
GFR calc Af Amer: >60 mL/min (ref >60)
GFR calc non Af Amer: >60 mL/min (ref >60)
GLUCOSE: 123 mg/dL — AB (ref 65–99)
Potassium: 4.5 mmol/L (ref 3.5–5.1)
Sodium: 138 mmol/L (ref 135–145)
TOTAL PROTEIN: 6.6 g/dL (ref 6.5–8.1)

## 2016-04-28 LAB — CBC WITH DIFFERENTIAL/PLATELET
BASOS ABS: 0 10*3/uL (ref 0.0–0.1)
BASOS PCT: 0 %
EOS PCT: 1 %
Eosinophils Absolute: 0.1 10*3/uL (ref 0.0–0.7)
HCT: 40.9 % (ref 36.0–46.0)
Hemoglobin: 13.5 g/dL (ref 12.0–15.0)
Lymphocytes Relative: 21 %
Lymphs Abs: 1.9 10*3/uL (ref 0.7–4.0)
MCH: 28.6 pg (ref 26.0–34.0)
MCHC: 33 g/dL (ref 30.0–36.0)
MCV: 86.7 fL (ref 78.0–100.0)
MONO ABS: 0.7 10*3/uL (ref 0.1–1.0)
MONOS PCT: 8 %
Neutro Abs: 6.3 10*3/uL (ref 1.7–7.7)
Neutrophils Relative %: 70 %
PLATELETS: 265 10*3/uL (ref 150–400)
RBC: 4.72 MIL/uL (ref 3.87–5.11)
RDW: 15.1 % (ref 11.5–15.5)
WBC: 9 10*3/uL (ref 4.0–10.5)

## 2016-04-28 LAB — PHOSPHORUS: Phosphorus: 3.6 mg/dL (ref 2.5–4.6)

## 2016-04-28 LAB — PREALBUMIN: PREALBUMIN: 17.9 mg/dL — AB (ref 18–38)

## 2016-04-28 MED ORDER — DIAZEPAM 5 MG PO TABS
5.0000 mg | ORAL_TABLET | Freq: Every evening | ORAL | Status: DC | PRN
  Administered 2016-04-28 – 2016-05-01 (×4): 5 mg via ORAL
  Filled 2016-04-28 (×4): qty 1

## 2016-04-28 MED ORDER — MOMETASONE FURO-FORMOTEROL FUM 200-5 MCG/ACT IN AERO
2.0000 | INHALATION_SPRAY | Freq: Two times a day (BID) | RESPIRATORY_TRACT | Status: DC
  Administered 2016-04-28 – 2016-05-02 (×6): 2 via RESPIRATORY_TRACT
  Filled 2016-04-28: qty 8.8

## 2016-04-28 MED ORDER — IOPAMIDOL (ISOVUE-300) INJECTION 61%
INTRAVENOUS | Status: AC
  Administered 2016-04-28: 100 mL
  Filled 2016-04-28: qty 30

## 2016-04-28 MED ORDER — ALBUTEROL SULFATE (2.5 MG/3ML) 0.083% IN NEBU: 2.5000 mg | INHALATION_SOLUTION | Freq: Four times a day (QID) | RESPIRATORY_TRACT | Status: DC | PRN

## 2016-04-28 MED ORDER — ACETAMINOPHEN 325 MG PO TABS: 650.0000 mg | ORAL_TABLET | Freq: Four times a day (QID) | ORAL | Status: DC | PRN

## 2016-04-28 MED ORDER — RIVAROXABAN 15 MG PO TABS: 15.0000 mg | ORAL_TABLET | Freq: Two times a day (BID) | ORAL | Status: DC

## 2016-04-28 MED ORDER — APIXABAN 5 MG PO TABS
10.0000 mg | ORAL_TABLET | Freq: Two times a day (BID) | ORAL | Status: DC
  Administered 2016-04-28 – 2016-05-02 (×8): 10 mg via ORAL
  Filled 2016-04-28 (×2): qty 2
  Filled 2016-04-28: qty 4
  Filled 2016-04-28 (×6): qty 2

## 2016-04-28 MED ORDER — ENSURE ENLIVE PO LIQD
237.0000 mL | Freq: Two times a day (BID) | ORAL | Status: DC
  Administered 2016-04-29 – 2016-05-02 (×7): 237 mL via ORAL

## 2016-04-28 MED ORDER — DONEPEZIL HCL 10 MG PO TABS
10.0000 mg | ORAL_TABLET | Freq: Every day | ORAL | Status: DC
  Administered 2016-04-28 – 2016-05-01 (×4): 10 mg via ORAL
  Filled 2016-04-28 (×4): qty 1

## 2016-04-28 MED ORDER — SODIUM CHLORIDE 0.9 % IV SOLN
INTRAVENOUS | Status: DC
  Administered 2016-04-28 – 2016-05-01 (×6): via INTRAVENOUS

## 2016-04-28 MED ORDER — ACETAMINOPHEN 650 MG RE SUPP: 650.0000 mg | Freq: Four times a day (QID) | RECTAL | Status: DC | PRN

## 2016-04-28 MED ORDER — HEPARIN SODIUM (PORCINE) 5000 UNIT/ML IJ SOLN: 5000.0000 [IU] | Freq: Three times a day (TID) | INTRAMUSCULAR | Status: DC

## 2016-04-28 MED ORDER — METOCLOPRAMIDE HCL 5 MG/ML IJ SOLN
5.0000 mg | Freq: Three times a day (TID) | INTRAMUSCULAR | Status: DC
  Administered 2016-04-28 – 2016-05-01 (×11): 5 mg via INTRAVENOUS
  Filled 2016-04-28 (×11): qty 2

## 2016-04-28 MED ORDER — ONDANSETRON HCL 4 MG PO TABS: 4.0000 mg | ORAL_TABLET | Freq: Four times a day (QID) | ORAL | Status: DC | PRN

## 2016-04-28 MED ORDER — APIXABAN 5 MG PO TABS: 5.0000 mg | ORAL_TABLET | Freq: Two times a day (BID) | ORAL | Status: DC

## 2016-04-28 MED ORDER — ASPIRIN EC 81 MG PO TBEC
81.0000 mg | DELAYED_RELEASE_TABLET | Freq: Every day | ORAL | Status: DC
  Administered 2016-04-28 – 2016-05-01 (×4): 81 mg via ORAL
  Filled 2016-04-28 (×4): qty 1

## 2016-04-28 MED ORDER — SUCRALFATE 1 GM/10ML PO SUSP
1.0000 g | Freq: Once | ORAL | Status: AC
  Administered 2016-04-28: 1 g via ORAL
  Filled 2016-04-28: qty 10

## 2016-04-28 MED ORDER — DICLOFENAC SODIUM 1 % TD GEL
4.0000 g | Freq: Four times a day (QID) | TRANSDERMAL | Status: DC | PRN
  Filled 2016-04-28: qty 100

## 2016-04-28 MED ORDER — ONDANSETRON HCL 4 MG/2ML IJ SOLN: 4.0000 mg | Freq: Four times a day (QID) | INTRAMUSCULAR | Status: DC | PRN

## 2016-04-28 MED ORDER — ASPIRIN 325 MG PO TABS: 325.0000 mg | ORAL_TABLET | Freq: Every day | ORAL | Status: DC

## 2016-04-28 NOTE — Progress Notes (Signed)
Pulmonary Embolism Nausea Generalized weakness Subjective: From last note with a few addendums underlined "Patient hospitalized in October for UTI and took about a month to turn around after UTI. She was finally recovering when Sunday December 3rd- developed diverticulitis and UTI. On antibiotic 2 week course ceftin (flagyl)and flagyl (and diverticulitis). Of note urine culture showed no growth at that time. Went home and then went back to [facility] on 04/15/16 and kept for about 5 hours- went in for chest pain. Did CXR and echocardiogram and another scan of abdomen which showed improveement in diverticulitis.   Home about 10 days. - slowly has stopped eating. Zofran not helping much. States always nauseated- keeps green plastic bag with her but has never thrown up. Takes 2-3 bites and stops. Lost 6-7 lbs since October. Weaker. Using walking to get to bathroom. Sleeps constantly. When she wakes up she is more confused than normal (slow progression) . Before this decline had been getting down to dining hall.   Her biggest complaint outside of nausea in this time has been that her Chest hurts on right side near axilla which is worst and also hurts closer to center of chest but on the right side. Hurts all the time worse with deep breaths. More short of breath than normal. Copd at baseline but worse than that. Wont work with PT/OT. Sleeping way more than normal. Seems slightly more confused. History recurrent UTIs and sometimes mentation worsens  Of note patient has had echocardogram in October which showed mild diastolic dysfunciton with EF 60%."  We did a CBC and CMP which were largely unremarkable. EKG without clear st or t wave changes, there swas a lot of movement on exam.  D-dimer was positive (done for right sided chest pain). CT showed very small PE and patient was started on xarelto 15mg  BID on Friday. We scheduled zofran over the weekend but this didn't help.   Also has had some  intermittent diarrhea in this time frame. Daughter updates me that she has had pretty stable severe nausea for about 2 weeks now and keeps plastic bag around. Has never thrown up. Will barely eat even the foods she loves  Patient is Not caring for self anymore. Was in independent living. Daughter is having to clean her mom now and not having to do that previous. Now on xarelto she is worried about her fall risk if she is not around. We discussed with hospitalization likely wouldneed to transfer to SNF at least short term.   New today- complaining of mild diffuse abdominal pain but this really is minimal and only on questioning- had not told daughter prior to visit. She does have a small gallstone but doubt this would cause her deconditioning.   ROS- no fever, chills, vomiting. Has had significant nausea. Has had weight loss.   Objective: BP 98/62 (BP Location: Left Arm, Patient Position: Sitting, Cuff Size: Normal)   Pulse 88   Temp 97.5 F (36.4 C) (Oral)   Ht 5\' 2"  (1.575 m)   Wt 112 lb 12.8 oz (51.2 kg)   SpO2 95%   BMI 20.63 kg/m  Gen: NAD, fatigued and intermittently falls asleep, depends on daughter for interpretation of conversation when awake Dry lips, moist tongue CV: RRR no murmurs rubs or gallops Lungs: CTAB no crackles, wheeze, rhonchi Abdomen: soft/mild diffuse tenderness/nondistended/normal bowel sounds. No rebound or guarding.  Ext: no edema  Skin: warm, dry, no rash Neuro: grossly normal, moves all extremities, in wheelchair today  Assessment/Plan: 1.Generalized weakness/nausea- could be gastroenteritis related with nausea no vomiting and intermittent diarrhea. I was also very clear with daughter with number of recent ED and hospitalizations (2 ED visits and now 3 hospitalizations in 2 months) along with weight loss that this could be a sign that patient is moving toward the end of her life. Discussed with Dr. Marily Memos hospice/palliative care consult. Think patient may at  least in the short term benefit from fluids. Direct admitssion for obs but may change to inpatient planned. Would ask for [facility] help about SNF with less than 3 day admission as do not believe patient can care for herself anymore in independent living.  - plan CBC/CMP when arrives to hospital - if abdominal pain worsens or exam more impressive- consider repeat Ct abd/pelvis - doubt cardiac (have not opted to pursue stress testing) - ruled out UTI last week - zofran has not helped, did not use phenergan due to sedation more than nomral  2. Pulmonary Embolism- continue xarelto 15 mg BID. For now also on aspirin with history of CVA. Mild increase SOB without wheeze  3. COPD- continue symbicort. Has needed oxygen at night in past. Appears stable  4. Dementia- on aricept. Makes history taking difficult/unreliable  5. Insomnia- hold diazepam for sleep while in hospitallikely

## 2016-04-28 NOTE — Progress Notes (Signed)
Pre visit review using our clinic review tool, if applicable. No additional management support is needed unless otherwise documented below in the visit note. 

## 2016-04-28 NOTE — H&P (Signed)
History and Physical    [name] [name] [id number] DOB: Apr 03, 1924 DOA: 04/28/2016  PCP: Garret Reddish, MD Patient coming from: PCP office  Chief Complaint: generalized weakness and abdominal pain.    HPI: [name] [name] is a 82 y.o. female with medical history significant of 5 mm stenotic airway, anxiety, A. fib, CAD, COPD, diverticulitis, GERD, hiatal hernia, hypertension, IBS, breast cancer, osteoporosis, pulmonary fibrosis, Raynaud's, presenting w/ several week history of multiple medical problems w/ new symptoms over past 3 days. Level V caveat applies as patient is unable to provide reliable history at this time due to baseline dementia and acute confusional state. History provided predominantly by patient's daughter who is one of her primary caretakers. Of note prior to onset of multiple medical complications over the last several weeks patient was doing well in an independent living facility. Patient able to perform her ADLs prior to that time. Of note patient diagnosed and admitted for treatment of a UTI in October 2017. Patient was again seen on 04/13/2016 in the emergency room and started on treatment for diverticulitis. Urine cultures at that time were negative. Patient seen 2 days later on 04/15/2016 [facility] emergency room for persistent abdominal discomfort and new onset right upper chest wall pain. Workup at that time showed improvement in her diverticulitis. Patient completed antibiotic course on 04/21/2016. Patient continued to stay have chest pain which is unchanged from onset on 08/15/2015. Worse with deep breathing or certain movements. Patient's daughter reports significant physical mental decline starting on 04/25/2016. No additional focal complaints outside of persistent abdominal discomfort, intermittent diarrhea, and chest pain as outlined above. Patient's current confusional state is very unusual for her per patient started. Her baseline dementia typically keeps her intermittently  mildly confused or forgetful. Patient forgetting to take her medications. Abdominal pain is described as diffuse with localization to the left upper quadrant. Sometimes worse with food. Nothing makes her symptoms better. Single episode of emesis after ensure.  Of note patient diagnosed on 04/25/2016 with a ulnar embolus confirmed on CTA. Started on Xarelto since that time. Denies any malonic stool. Denies any recent fevers, dysuria, frequency, flank pain, rash, neck stiffness, headache, vertigo, palpitations, LOC.   ED Course: NA  Review of Systems: As per HPI otherwise 10 point review of systems negative.   Ambulatory Status:generalized weakness but ambulates by self.   Past Medical History:  Diagnosis Date  . Airway obstruction   . Allergic rhinitis   . Anal stricture   . ANXIETY 08/16/2008  . Atrial fibrillation ([place])    she denies known hx of afib 02/24/13  . CAD, UNSPECIFIED SITE 10/17/2008  . Candida esophagitis ([place])   . CHOLELITHIASIS 08/23/2008  . Chronic constipation   . Colitis    sigmoid  . Complication of anesthesia    difficult intubation  . COPD 01/04/2010   FeV1 101%, DLCO64% 2008  . COPD (chronic obstructive pulmonary disease) ([place]) 01/04/2010  . 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 Diverticulitis   . DIVERTICULOSIS-COLON 04/07/2008  . Emphysema of lung ([place])   . Esophageal stricture   . Fatty liver   . GERD 04/19/2007  . Hearing aid worn    bilateral  . Hiatal hernia   . History of diverticulitis of colon   . HYPERSOMNIA, ASSOCIATED WITH SLEEP APNEA 02/01/2007  . HYPERTENSION 11/12/2006  . IBS (irritable bowel syndrome)   . MITRAL VALVE PROLAPSE, HX OF 10/07/2008  . NEOPLASM, MALIGNANT, BREAST, RIGHT 02/15/2008  . OSTEOPOROSIS 04/04/2008  . Pulmonary fibrosis (  [place])   . Raynaud's syndrome 04/25/2009  . Sleep apnea   . Tortuous colon   . UTI (urinary tract infection)     Past Surgical History:  Procedure Laterality Date  . ABDOMINAL HYSTERECTOMY     . APPENDECTOMY    . BILATERAL SALPINGOOPHORECTOMY     s/p prolapsed bladder  . BLADDER SURGERY     prolapsed  . BREAST CYST EXCISION     left  . BREAST SURGERY  07-21-07   mastectomy (right), all margins neg and LNs neg   . EYE SURGERY     cataract removal bilateral  . LUNG SURGERY    . MASTECTOMY     right  . MELANOMA EXCISION     rt clavicle Dr Rhoderick [facility] office)  . MOUTH SURGERY    . ROTATOR CUFF REPAIR    . THORACOTOMY     granuloma, pulmonary-thoracotomy  . TONSILLECTOMY      Social History   Social History  . Marital status: Widowed    Spouse name: N/A  . Number of children: 3  . Years of education: N/A   Occupational History  . retireed Retired   Social History Main Topics  . Smoking status: Former Smoker    Packs/day: 2.00    Years: 30.00    Types: Cigarettes    Quit date: 05/13/1983  . Smokeless tobacco: Never Used  . Alcohol use 0.0 oz/week     Comment: 1-2 glaases wine per day  . Drug use: No  . Sexual activity: No   Other Topics Concern  . Not on file   Social History Narrative   Living in assisted living at [facility]. Widowed 1991. 3 children. 7 grandkids. No greatgrandkids.       Walks down for her meals with a walker.    ADL-bath, clothe, cleaning   IADL-daughter does finances      HCPOA- daughter [name] [name]   Full code for now- have counseled and advised consider DNR/DNI   States enjoys living and would want resuscitation currently      [facility]:   Patient is originally from [place]. Has traveled to nearly ever one of the [place] states. She has prior travel to [place] & the [place]. Previously enjoyed gardening. 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 Remote bird exposure to a canary as a child. Previously had bird houses in her yard that she cleaned out.     Allergies  Allergen Reactions  . Azithromycin Hives, Diarrhea, Dermatitis and Rash  . Ciprofloxacin Swelling  . Levaquin [Levofloxacin]  Hives  . Penicillins Hives    has had mild doses that she did not have reactions to  Has patient had a PCN reaction causing immediate rash, facial/tongue/throat swelling, SOB or lightheadedness with hypotension:Yes Has patient had a PCN reaction causing severe rash involving mucus membranes or skin necrosis: No Has patient had a PCN reaction that required hospitalization: no Has patient had a PCN reaction occurring within the last 10 years: no If all of the above answers are "NO", then may proceed with Cephalosporin use.   . Sulfamethoxazole Nausea And Vomiting    Family History  Problem Relation Age of Onset  . Parkinsonism Father   . Coronary artery disease Father   . Stroke Mother   . Heart failure Mother   . Breast cancer      aunt  . Colon cancer      aunt, age 22  .  Uterine cancer      aunt  . Breast cancer Maternal Aunt   . Colon cancer Maternal Aunt   . Uterine cancer Maternal Aunt   . Lung disease Neg Hx     Prior to Admission medications   Medication Sig Start Date End Date Taking? Authorizing Provider  albuterol (PROVENTIL HFA;VENTOLIN HFA) 108 (90 Base) MCG/ACT inhaler Inhale 2 puffs into the lungs every 6 (six) hours as needed for wheezing or shortness of breath. 11/23/15   Dorothyann Peng, NP  aspirin 325 MG tablet Take 325 mg by mouth at bedtime.     Historical Provider, MD  budesonide-formoterol (SYMBICORT) 160-4.5 MCG/ACT inhaler Inhale 2 puffs into the lungs 2 (two) times daily. 02/19/16   Marin Olp, MD  diazepam (VALIUM) 5 MG tablet TAKE 1 MG BY MOUTH AT BEDTIME Patient taking differently: Take 5 mg by mouth at bedtime.  04/14/16   Debbe Odea, MD  donepezil (ARICEPT) 10 MG tablet Take 1/2 tablet daily for 1 month, then increase to 1 tablet daily Patient taking differently: Take 10 mg by mouth at bedtime.  12/07/15   Cameron Sprang, MD  fluticasone (FLONASE) 50 MCG/ACT nasal spray Place 2 sprays into both nostrils daily. Patient taking differently: Place  2 sprays into both nostrils at bedtime.  11/19/15   Delano Metz, FNP  nitroGLYCERIN (NITROSTAT) 0.4 MG SL tablet Place 1 tablet (0.4 mg total) under the tongue every 5 (five) minutes as needed for chest pain (take as directed). 04/12/15   Fay Records, MD  ondansetron (ZOFRAN) 4 MG tablet Take 1 tablet (4 mg total) by mouth every 8 (eight) hours as needed for nausea or vomiting. 03/19/16   Dorothyann Peng, NP    Physical Exam: Vitals:   04/28/16 1208  BP: 125/68  Pulse: 64  Resp: 16  Temp: 97.7 F (36.5 C)  TempSrc: Oral  SpO2: 98%  Weight: 60.6 kg (133 lb 9.6 oz)  Height: 5\' 2"  (1.575 m)     General:  Appears calm and comfortable Eyes:  PERRL, EOMI, normal lids, iris ENT:  grossly normal hearing, lips & tongue, mmm Neck:  no LAD, masses or thyromegaly Cardiovascular:  RRR, no m/r/g. No LE edema, no carotid bruits Respiratory:  CTA bilaterally, no w/r/r. Normal respiratory effort. Abdomen: Nondistended. Mild tenderness to deep palpation in left upper quadrant, NABS Skin:  no rash or induration seen on limited exam Musculoskeletal:  grossly normal tone BUE/BLE, good ROM, no bony abnormality Psychiatric:  Alert and oriented to person and place. Pleasant. Follows basic commands. Neurologic:  CN 2-12 grossly intact, moves all extremities in coordinated fashion, sensation intact  Labs on Admission: I have personally reviewed following labs and imaging studies  CBC:  Recent Labs Lab 04/24/16 1232  WBC 6.2  NEUTROABS 4.2  HGB 13.4  HCT 39.8  MCV 84.8  PLT 0000000   Basic Metabolic Panel:  Recent Labs Lab 04/24/16 1232  NA 139  K 4.2  CL 101  CO2 35*  GLUCOSE 107*  BUN 8  CREATININE 0.71  CALCIUM 8.9   GFR: Estimated Creatinine Clearance: 40.1 mL/min (by C-G formula based on SCr of 0.71 mg/dL). Liver Function Tests:  Recent Labs Lab 04/24/16 1232  AST 13  ALT 5  ALKPHOS 52  BILITOT 0.6  PROT 6.1  ALBUMIN 3.7   No results for input(s): LIPASE, AMYLASE in  the last 168 hours. No results for input(s): AMMONIA in the last 168 hours. Coagulation Profile: No results for  input(s): INR, PROTIME in the last 168 hours. Cardiac Enzymes: No results for input(s): CKTOTAL, CKMB, CKMBINDEX, TROPONINI in the last 168 hours. BNP (last 3 results) No results for input(s): PROBNP in the last 8760 hours. HbA1C: No results for input(s): HGBA1C in the last 72 hours. CBG: No results for input(s): GLUCAP in the last 168 hours. Lipid Profile: No results for input(s): CHOL, HDL, LDLCALC, TRIG, CHOLHDL, LDLDIRECT in the last 72 hours. Thyroid Function Tests: No results for input(s): TSH, T4TOTAL, FREET4, T3FREE, THYROIDAB in the last 72 hours. Anemia Panel: No results for input(s): VITAMINB12, FOLATE, FERRITIN, TIBC, IRON, RETICCTPCT in the last 72 hours. Urine analysis:    Component Value Date/Time   COLORURINE YELLOW 04/15/2016 1040   APPEARANCEUR CLEAR 04/15/2016 1040   LABSPEC 1.012 04/15/2016 1040   PHURINE 7.0 04/15/2016 1040   GLUCOSEU NEGATIVE 04/15/2016 1040   HGBUR NEGATIVE 04/15/2016 1040   HGBUR negative 04/07/2008 1505   BILIRUBINUR n 04/24/2016 1259   KETONESUR 5 (A) 04/15/2016 1040   PROTEINUR trace 04/24/2016 1259   PROTEINUR NEGATIVE 04/15/2016 1040   UROBILINOGEN 0.2 04/24/2016 1259   UROBILINOGEN 0.2 07/25/2014 2145   NITRITE n 04/24/2016 1259   NITRITE NEGATIVE 04/15/2016 1040   LEUKOCYTESUR small (1+) (A) 04/24/2016 1259    Creatinine Clearance: Estimated Creatinine Clearance: 40.1 mL/min (by C-G formula based on SCr of 0.71 mg/dL).  Sepsis Labs: @LABRCNTIP (procalcitonin:4,lacticidven:4) ) Recent Results (from the past 240 hour(s))  Urine culture     Status: None   Collection Time: 04/24/16 12:32 PM  Result Value Ref Range Status   Organism ID, Bacteria NO GROWTH  Final     Radiological Exams on Admission: No results found.  EKG: pending  Assessment/Plan Active Problems:   COPD with emphysema Gold B   Pulmonary  nodules   Insomnia   Abdominal pain   Acute encephalopathy   Generalized weakness   Acute pulmonary embolism (HCC)   COPD exacerbation (HCC)   Abdominal pain: Completed 7 day course of antibiotics for diverticulitis on 04/21/2016. Intermittent diarrhea since that time. Afebrile and vital signs are stable. - Abdominal CT - H. Pylori - IVF, Zofran, Reglan - further mgt per workup - clear liquid diet - ADAT  Acute encephalopathy: Baseline dementia, but patient functional and independent living, until 04/25/2016. Marked departure from normal with confusion and inability to perform ADLs. Progressive physical decline due to multiple acute illnesses over the last 2 months with poor mental reserve versus acute process (infectious, metabolic, polypharmacy, other). - Workup as above - continue Aricept - Head CT if not improving.   Chest pain: reproducible on palpation of R upper chest wall or deep breathing. Doubt ACS or due to PE. ?? Muscle strain from emesis. H/o CAD w/o ACS. - continue ASA and nitro - Trop, EKG, BNP x1 (has PE) - Voltaren Gel  General weakness/Physical deconditioning: Progressive since October 2017. Some of this is expected due to patient's age and chronic medical conditions. - Prealbumin - PT/OT/nutrition consults - CBC to rule out anemia given recent initiation of Xarelto  PE: Dx on 04/25/16.  - continue Xarelto. May need to change to Eliquis if continues to be symptomatic as most recent decline in mental function came after starting eliquis - Workup as above.   COPD/pulm fibrosis: at baseline. No O2 - continue dulera - PRN albuterol  Insomnia: - continue home Valium  Social: pt currently in independent living. Unlikely to be a sustainable situation for pt moving forward. Consider SNF. Additionally would  benefit from palliative care consult for [place] given gradual decline since 02/2016. pts daughter, caretaker, very amenable to such discussions.  - Palliative care  consult - [place] - placement if needed.    DVT prophylaxis: Xarelto  Code Status: FULL - confirmed w/ pt and pts daughter at time of admission  Family Communication: daughter  Disposition Plan: pending improvememt and workup  Consults called: none  Admission status: obs.     [name] [name] J MD [facility]  If 7PM-7AM, please contact night-coverage [email or web address] Password [email or web address]  04/28/2016, 2:23 PM

## 2016-04-28 NOTE — Progress Notes (Signed)
ANTICOAGULATION CONSULT NOTE - Initial Consult  Pharmacy Consult for Apixaban Indication: New PE (diagnosed 12/15)  Allergies  Allergen Reactions  . Azithromycin Hives, Diarrhea, Dermatitis and Rash  . Ciprofloxacin Swelling  . Levaquin [Levofloxacin] Hives  . Penicillins Hives    has had mild doses that she did not have reactions to  Has patient had a PCN reaction causing immediate rash, facial/tongue/throat swelling, SOB or lightheadedness with hypotension:Yes Has patient had a PCN reaction causing severe rash involving mucus membranes or skin necrosis: No Has patient had a PCN reaction that required hospitalization: no Has patient had a PCN reaction occurring within the last 10 years: no If all of the above answers are "NO", then may proceed with Cephalosporin use.   . Sulfamethoxazole Nausea And Vomiting    Patient Measurements: Height: 5\' 2"  (157.5 cm) Weight: 133 lb 9.6 oz (60.6 kg) IBW/kg (Calculated) : 50.1  Vital Signs: Temp: 97.7 F (36.5 C) (12/18 1208) Temp Source: Oral (12/18 1208) BP: 125/68 (12/18 1208) Pulse Rate: 64 (12/18 1208)  Labs: No results for input(s): HGB, HCT, PLT, APTT, LABPROT, INR, HEPARINUNFRC, HEPRLOWMOCWT, CREATININE, CKTOTAL, CKMB, TROPONINI in the last 72 hours.  Estimated Creatinine Clearance: 40.1 mL/min (by C-G formula based on SCr of 0.71 mg/dL).   Medical History: Past Medical History:  Diagnosis Date  . Airway obstruction   . Allergic rhinitis   . Anal stricture   . ANXIETY 08/16/2008  . Atrial fibrillation ([place])    she denies known hx of afib 02/24/13  . CAD, UNSPECIFIED SITE 10/17/2008  . Candida esophagitis ([place])   . CHOLELITHIASIS 08/23/2008  . Chronic constipation   . Colitis    sigmoid  . Complication of anesthesia    difficult intubation  . COPD 01/04/2010   FeV1 101%, DLCO64% 2008  . COPD (chronic obstructive pulmonary disease) ([place]) 01/04/2010  . 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 Diverticulitis    . DIVERTICULOSIS-COLON 04/07/2008  . Emphysema of lung ([place])   . Esophageal stricture   . Fatty liver   . GERD 04/19/2007  . Hearing aid worn    bilateral  . Hiatal hernia   . History of diverticulitis of colon   . HYPERSOMNIA, ASSOCIATED WITH SLEEP APNEA 02/01/2007  . HYPERTENSION 11/12/2006  . IBS (irritable bowel syndrome)   . MITRAL VALVE PROLAPSE, HX OF 10/07/2008  . NEOPLASM, MALIGNANT, BREAST, RIGHT 02/15/2008  . OSTEOPOROSIS 04/04/2008  . Pulmonary fibrosis ([place])   . Raynaud's syndrome 04/25/2009  . Sleep apnea   . Tortuous colon   . UTI (urinary tract infection)     Assessment: 24 YOF recently diagnosed with a new PE on 12/15 and started on Xarelto who presented on 12/19 with generalized weakness and abdominal pain. The patient's last dose of Xarelto noted to be this AM at 1000.   Per discussion with Dr. Marily Memos today - given the patient's advanced age, the patient will be transitioned to Apixaban to continue anticoagulation for the new PE.  Goal of Therapy:  Appropriate anticoagulation for indication and hepatic/renal function   Plan:  1. Start Apixaban 10 mg twice daily for 7 days 2. Transition to Apixaban 5 mg twice daily starting on 12/25 3. Will reduce ASA to 81 mg while starting apixaban 4. Will continue to monitor for any signs/symptoms of bleeding  Thank you for allowing pharmacy to be a part of this patient's care.  Alycia Rossetti, PharmD, BCPS Clinical Pharmacist Pager: [phone] Clinical phone for 04/28/2016 from 7a-3:30p: [phone] If after  3:30p, please call main pharmacy at: [phone] 04/28/2016 3:07 PM

## 2016-04-29 ENCOUNTER — Encounter (HOSPITAL_COMMUNITY): Payer: Self-pay | Admitting: *Deleted

## 2016-04-29 DIAGNOSIS — G4700 Insomnia, unspecified: Secondary | ICD-10-CM | POA: Diagnosis present

## 2016-04-29 DIAGNOSIS — I341 Nonrheumatic mitral (valve) prolapse: Secondary | ICD-10-CM | POA: Diagnosis present

## 2016-04-29 DIAGNOSIS — Z8249 Family history of ischemic heart disease and other diseases of the circulatory system: Secondary | ICD-10-CM | POA: Diagnosis not present

## 2016-04-29 DIAGNOSIS — Z515 Encounter for palliative care: Secondary | ICD-10-CM | POA: Diagnosis not present

## 2016-04-29 DIAGNOSIS — J438 Other emphysema: Secondary | ICD-10-CM

## 2016-04-29 DIAGNOSIS — I482 Chronic atrial fibrillation: Secondary | ICD-10-CM | POA: Diagnosis not present

## 2016-04-29 DIAGNOSIS — R1011 Right upper quadrant pain: Secondary | ICD-10-CM

## 2016-04-29 DIAGNOSIS — R627 Adult failure to thrive: Secondary | ICD-10-CM | POA: Diagnosis present

## 2016-04-29 DIAGNOSIS — R5381 Other malaise: Secondary | ICD-10-CM

## 2016-04-29 DIAGNOSIS — J8410 Pulmonary fibrosis, unspecified: Secondary | ICD-10-CM | POA: Diagnosis present

## 2016-04-29 DIAGNOSIS — G9340 Encephalopathy, unspecified: Secondary | ICD-10-CM | POA: Diagnosis not present

## 2016-04-29 DIAGNOSIS — K5732 Diverticulitis of large intestine without perforation or abscess without bleeding: Secondary | ICD-10-CM | POA: Diagnosis present

## 2016-04-29 DIAGNOSIS — K760 Fatty (change of) liver, not elsewhere classified: Secondary | ICD-10-CM | POA: Diagnosis present

## 2016-04-29 DIAGNOSIS — Z7189 Other specified counseling: Secondary | ICD-10-CM | POA: Diagnosis not present

## 2016-04-29 DIAGNOSIS — H9190 Unspecified hearing loss, unspecified ear: Secondary | ICD-10-CM | POA: Diagnosis present

## 2016-04-29 DIAGNOSIS — R5383 Other fatigue: Secondary | ICD-10-CM

## 2016-04-29 DIAGNOSIS — I2699 Other pulmonary embolism without acute cor pulmonale: Secondary | ICD-10-CM | POA: Diagnosis not present

## 2016-04-29 DIAGNOSIS — K5792 Diverticulitis of intestine, part unspecified, without perforation or abscess without bleeding: Secondary | ICD-10-CM

## 2016-04-29 DIAGNOSIS — F419 Anxiety disorder, unspecified: Secondary | ICD-10-CM | POA: Diagnosis present

## 2016-04-29 DIAGNOSIS — R531 Weakness: Secondary | ICD-10-CM | POA: Diagnosis not present

## 2016-04-29 DIAGNOSIS — G9341 Metabolic encephalopathy: Secondary | ICD-10-CM | POA: Diagnosis present

## 2016-04-29 DIAGNOSIS — K222 Esophageal obstruction: Secondary | ICD-10-CM | POA: Diagnosis present

## 2016-04-29 DIAGNOSIS — N390 Urinary tract infection, site not specified: Secondary | ICD-10-CM | POA: Diagnosis present

## 2016-04-29 DIAGNOSIS — I700 Atherosclerosis of aorta: Secondary | ICD-10-CM | POA: Diagnosis present

## 2016-04-29 DIAGNOSIS — I2510 Atherosclerotic heart disease of native coronary artery without angina pectoris: Secondary | ICD-10-CM | POA: Diagnosis present

## 2016-04-29 DIAGNOSIS — Z974 Presence of external hearing-aid: Secondary | ICD-10-CM | POA: Diagnosis not present

## 2016-04-29 DIAGNOSIS — K219 Gastro-esophageal reflux disease without esophagitis: Secondary | ICD-10-CM | POA: Diagnosis present

## 2016-04-29 DIAGNOSIS — Z87891 Personal history of nicotine dependence: Secondary | ICD-10-CM | POA: Diagnosis not present

## 2016-04-29 DIAGNOSIS — G309 Alzheimer's disease, unspecified: Secondary | ICD-10-CM | POA: Diagnosis present

## 2016-04-29 DIAGNOSIS — Z880 Allergy status to penicillin: Secondary | ICD-10-CM | POA: Diagnosis not present

## 2016-04-29 DIAGNOSIS — I10 Essential (primary) hypertension: Secondary | ICD-10-CM | POA: Diagnosis present

## 2016-04-29 DIAGNOSIS — M6281 Muscle weakness (generalized): Secondary | ICD-10-CM | POA: Diagnosis not present

## 2016-04-29 DIAGNOSIS — J441 Chronic obstructive pulmonary disease with (acute) exacerbation: Secondary | ICD-10-CM | POA: Diagnosis present

## 2016-04-29 DIAGNOSIS — F0280 Dementia in other diseases classified elsewhere without behavioral disturbance: Secondary | ICD-10-CM | POA: Diagnosis present

## 2016-04-29 DIAGNOSIS — Z881 Allergy status to other antibiotic agents status: Secondary | ICD-10-CM | POA: Diagnosis not present

## 2016-04-29 LAB — COMPREHENSIVE METABOLIC PANEL
ALT: 7 U/L — AB (ref 14–54)
AST: 16 U/L (ref 15–41)
Albumin: 3.2 g/dL — ABNORMAL LOW (ref 3.5–5.0)
Alkaline Phosphatase: 44 U/L (ref 38–126)
Anion gap: 9 (ref 5–15)
BILIRUBIN TOTAL: 1.3 mg/dL — AB (ref 0.3–1.2)
BUN: 7 mg/dL (ref 6–20)
CO2: 23 mmol/L (ref 22–32)
CREATININE: 0.77 mg/dL (ref 0.44–1.00)
Calcium: 8.3 mg/dL — ABNORMAL LOW (ref 8.9–10.3)
Chloride: 103 mmol/L (ref 101–111)
GFR calc Af Amer: >60 mL/min (ref >60)
Glucose, Bld: 89 mg/dL (ref 65–99)
Potassium: 3.8 mmol/L (ref 3.5–5.1)
Sodium: 135 mmol/L (ref 135–145)
TOTAL PROTEIN: 5.9 g/dL — AB (ref 6.5–8.1)

## 2016-04-29 LAB — URINALYSIS, ROUTINE W REFLEX MICROSCOPIC
BILIRUBIN URINE: NEGATIVE
Bacteria, UA: NONE SEEN
GLUCOSE, UA: NEGATIVE mg/dL
Ketones, ur: 20 mg/dL — AB
Nitrite: NEGATIVE
PH: 5 (ref 5.0–8.0)
Protein, ur: NEGATIVE mg/dL
Specific Gravity, Urine: >1.046 — ABNORMAL HIGH (ref 1.005–1.030)

## 2016-04-29 LAB — CBC
HEMATOCRIT: 39.8 % (ref 36.0–46.0)
HEMOGLOBIN: 13.1 g/dL (ref 12.0–15.0)
MCH: 28.7 pg (ref 26.0–34.0)
MCHC: 32.9 g/dL (ref 30.0–36.0)
MCV: 87.1 fL (ref 78.0–100.0)
Platelets: 234 10*3/uL (ref 150–400)
RBC: 4.57 MIL/uL (ref 3.87–5.11)
RDW: 15.2 % (ref 11.5–15.5)
WBC: 7.4 10*3/uL (ref 4.0–10.5)

## 2016-04-29 LAB — TROPONIN I
Troponin I: <0.03 ng/mL (ref <0.03)
Troponin I: <0.03 ng/mL (ref <0.03)

## 2016-04-29 LAB — TSH: TSH: 3.251 u[IU]/mL (ref 0.350–4.500)

## 2016-04-29 MED ORDER — METRONIDAZOLE IN NACL 5-0.79 MG/ML-% IV SOLN
500.0000 mg | Freq: Once | INTRAVENOUS | Status: DC
  Filled 2016-04-29: qty 100

## 2016-04-29 MED ORDER — METRONIDAZOLE IN NACL 5-0.79 MG/ML-% IV SOLN
500.0000 mg | Freq: Three times a day (TID) | INTRAVENOUS | Status: DC
  Filled 2016-04-29: qty 100

## 2016-04-29 MED ORDER — SODIUM CHLORIDE 0.9 % IV SOLN
1.0000 g | Freq: Two times a day (BID) | INTRAVENOUS | Status: DC
  Administered 2016-04-29 – 2016-05-02 (×8): 1 g via INTRAVENOUS
  Filled 2016-04-29 (×8): qty 1

## 2016-04-29 NOTE — Progress Notes (Signed)
Initial Nutrition Assessment  DOCUMENTATION CODES:   Not applicable  INTERVENTION:   -Continue Ensure Enlive po BID, each supplement provides 350 kcal and 20 grams of protein  NUTRITION DIAGNOSIS:   Inadequate oral intake related to poor appetite as evidenced by meal completion < 25%.  GOAL:   Patient will meet greater than or equal to 90% of their needs  MONITOR:   PO intake, Supplement acceptance, Diet advancement, Labs, Weight trends, Skin, I & O's  REASON FOR ASSESSMENT:   Consult Assessment of nutrition requirement/status  ASSESSMENT:   [name] [name] is a 82 y.o. female with medical history significant of 5 mm stenotic airway, anxiety, A. fib, CAD, COPD, diverticulitis, GERD, hiatal hernia, hypertension, IBS, breast cancer, osteoporosis, pulmonary fibrosis, Raynaud's, presenting w/ several week history of multiple medical problems w/ new symptoms over past 3 days.   Pt admitted with abdominal pain and acute diverticulitis.   Pt sleeping soundly at time of visit. Pt did not arouse when name was called. Per staff, pt is hard of hearing.   Noted pt with multiple cups of liquids, including coffee and juices at bedside. Pt consumed 15% of breakfast meal. She is accepting Ensure supplements.   Abbreviated Nutrition-Focused physical exam completed. Findings are mild fat depletion, no muscle depletion, and no edema.   Case discussed with RN. Palliative care consult and family meeting regarding goals of care to be scheduled tomorrow, 04/30/16.  Labs reviewed.  Diet Order:  Diet full liquid Room service appropriate? Yes; Fluid consistency: Thin  Skin:  Reviewed, no issues  Last BM:  04/28/16  Height:   Ht Readings from Last 1 Encounters:  04/28/16 5\' 2"  (1.575 m)    Weight:   Wt Readings from Last 1 Encounters:  04/28/16 133 lb 9.6 oz (60.6 kg)    Ideal Body Weight:  50 kg  BMI:  Body mass index is 24.44 kg/m.  Estimated Nutritional Needs:   Kcal:   1300-1500  Protein:  65-80 grams  Fluid:  >1.3 L  EDUCATION NEEDS:   No education needs identified at this time  Kiyaan Haq A. Jimmye Norman, RD, LDN, CDE Pager: [phone] After hours Pager: [phone]

## 2016-04-29 NOTE — Progress Notes (Signed)
PROGRESS NOTE  [name] [name] [id number] DOB: 29-Nov-1923 DOA: 04/28/2016 PCP: Garret Reddish, MD  Brief History:  82 year old female with a history of dementia, recent PE (04/25/16), atrial fibrillation, pulmonary fibrosis, hypertension, breast cancer, IBS presented with worsening abdominal pain and generalized weakness over the past 3 days. The patient lives in an ILF to which she was  recently discharged from the hospital after a stay from 04/13/2016 through 04/14/2016 after treatment for diverticulitis and UTI. The patient was changed to cefpodoxime and metronidazole at the time of discharge.in addition, the patient also had a recent admission from 02/22/2016 through 02/26/2012 when she was treated for urinary tract infection and syncope. She was discharged on oral cephalexin. Her syncope was thought to be due to orthostatic hypotension. She had an emergency department visit on 02/28/2016 for abdominal pain. CT of the abdomen and pelvis at that time (02/28/16)  was negative for any acute findings, and the patient was discharged back to her ILF after receiving intravenous fluids.  After her December admission, the patient also had an additional ED visit on 04/15/2016 for chest pain and abdominal pain. Repeat CT of the abdomen and pelvis at that time showed improvement of her diverticulitis. The patient was discharged to continue her antibiotics. She was felt to be low risk for ACS. She was discharged back to her ILF. She saw her primary care physician on 04/24/2016 for continued chest pain. CT aangio of the chest was performed on 04/25/2016 which was positive for RML PE.  She was started on rivaroxaban.  Upon presentation, the patient was noted to be confused. The babysitter was 9.0, and the patient was afebrile hemodynamically stable. CT of the abdomen and pelvis on 04/28/2016 revealed acute diverticulitis of the distal descending colon. The patient was started on  meropenem.  Assessment/Plan: Acute diverticulitis -Previously finished 7 day course Cefpodoxime and metronidazole on 04/21/2016 -Started meropenem -04/28/2016 CT abdomen and pelvis--acute diverticulitis distal descending colon without abscess or perforation -advance to full liquids -Continue IV fluids -may need longer course abx after d/c -02/26/11 flex sig--proctitis and severe diverticulosis (Dr. Olevia Perches)  Acute metabolic encephalopathy -Secondary to infectious process -Suspect continue to functional and cognitive decline secondary to numerous acute medical conditions in the setting of established dementia -Continue Aricept  Pulmonary embolus -04/25/2016 CTA chest--RML PE -Rivaroxaban switched to apixaban -Echocardiogram  Chest pain -reproducible on palpation of R upper chest wall or deep breathing -cycle troponins -echo  Pyuria -continue merrem pending urine culture  Generalized weakness/failure to thrive -Progressive since October 2017 -Secondary to multiple acute and chronic medical conditions -PT/OT -TSH -B12  COPD/pulm fibrosis: at baseline.  -No O2 at ILF - continue dulera - PRN albuterol  GOC -palliative medicine consulted   Disposition Plan:   Anticipate SNF 1-2 days Family Communication:  No Family at bedside  Consultants:  Palliative medicine  Code Status:  FULL  DVT Prophylaxis:  apixaban  Total time spent 35 minutes.  Greater than 50% spent face to face counseling and coordinating care.    Procedures: As Listed in Progress Note Above  Antibiotics: Merrem 12/18>>>    Subjective: Patient still has some left-sided abdominal pain but it is better than yesterday. Denies any chest pain, shortness breath,  vomiting, diarrhea. She has some nausea. Denies any fevers, chills, dysuria, hematuria. She describes the abdominal pain is localized and sharp in nature.  Objective: Vitals:   04/28/16 2101 04/28/16 2115 04/28/16 2212 04/29/16 0511   BP:  139/62 (!) 122/54  Pulse:   69 67  Resp:   16 16  Temp:   98.4 F (36.9 C) 97.7 F (36.5 C)  TempSrc:   Oral Oral  SpO2: 98% 99% 97% 94%  Weight:      Height:        Intake/Output Summary (Last 24 hours) at 04/29/16 0943 Last data filed at 04/29/16 0525  Gross per 24 hour  Intake              811 ml  Output              150 ml  Net              661 ml   Weight change:  Exam:   General:  Pt is alert, follows commands appropriately, not in acute distress  HEENT: No icterus, No thrush, No neck mass, [place]/AT  Cardiovascular: RRR, S1/S2, no rubs, no gallops  Respiratory: CTA bilaterally, no wheezing, no crackles, no rhonchi  Abdomen: Soft/+BS, LUQ, LLQ tender, non distended, no guarding  Extremities: No edema, No lymphangitis, No petechiae, No rashes, no synovitis   Data Reviewed: I have personally reviewed following labs and imaging studies Basic Metabolic Panel:  Recent Labs Lab 04/24/16 1232 04/28/16 1613 04/29/16 0617  NA 139 138 135  K 4.2 4.5 3.8  CL 101 103 103  CO2 35* 27 23  GLUCOSE 107* 123* 89  BUN 8 9 7   CREATININE 0.71 0.81 0.77  CALCIUM 8.9 8.9 8.3*  MG  --  2.0  --   PHOS  --  3.6  --    Liver Function Tests:  Recent Labs Lab 04/24/16 1232 04/28/16 1613 04/29/16 0617  AST 13 17 16   ALT 5 8* 7*  ALKPHOS 52 44 44  BILITOT 0.6 1.1 1.3*  PROT 6.1 6.6 5.9*  ALBUMIN 3.7 3.5 3.2*   No results for input(s): LIPASE, AMYLASE in the last 168 hours. No results for input(s): AMMONIA in the last 168 hours. Coagulation Profile: No results for input(s): INR, PROTIME in the last 168 hours. CBC:  Recent Labs Lab 04/24/16 1232 04/28/16 1613 04/29/16 0617  WBC 6.2 9.0 7.4  NEUTROABS 4.2 6.3  --   HGB 13.4 13.5 13.1  HCT 39.8 40.9 39.8  MCV 84.8 86.7 87.1  PLT 275.0 265 234   Cardiac Enzymes: No results for input(s): CKTOTAL, CKMB, CKMBINDEX, TROPONINI in the last 168 hours. BNP: Invalid input(s): POCBNP CBG: No results for  input(s): GLUCAP in the last 168 hours. HbA1C: No results for input(s): HGBA1C in the last 72 hours. Urine analysis:    Component Value Date/Time   COLORURINE YELLOW 04/29/2016 0521   APPEARANCEUR CLEAR 04/29/2016 0521   LABSPEC >1.046 (H) 04/29/2016 0521   PHURINE 5.0 04/29/2016 0521   GLUCOSEU NEGATIVE 04/29/2016 0521   HGBUR MODERATE (A) 04/29/2016 0521   HGBUR negative 04/07/2008 1505   BILIRUBINUR NEGATIVE 04/29/2016 0521   BILIRUBINUR n 04/24/2016 1259   KETONESUR 20 (A) 04/29/2016 0521   PROTEINUR NEGATIVE 04/29/2016 0521   UROBILINOGEN 0.2 04/24/2016 1259   UROBILINOGEN 0.2 07/25/2014 2145   NITRITE NEGATIVE 04/29/2016 0521   LEUKOCYTESUR SMALL (A) 04/29/2016 0521   Sepsis Labs: @LABRCNTIP (procalcitonin:4,lacticidven:4) ) Recent Results (from the past 240 hour(s))  Urine culture     Status: None   Collection Time: 04/24/16 12:32 PM  Result Value Ref Range Status   Organism ID, Bacteria NO GROWTH  Final     Scheduled Meds: . apixaban  10 mg Oral BID   Followed by  . [START ON 05/05/2016] apixaban  5 mg Oral BID  . aspirin EC  81 mg Oral QHS  . donepezil  10 mg Oral QHS  . feeding supplement (ENSURE ENLIVE)  237 mL Oral BID BM  . meropenem (MERREM) IV  1 g Intravenous Q12H  . metoCLOPramide (REGLAN) injection  5 mg Intravenous Q8H  . mometasone-formoterol  2 puff Inhalation BID   Continuous Infusions: . sodium chloride 75 mL/hr at 04/29/16 [id number]    Procedures/Studies: Dg Chest 2 View  Result Date: 04/24/2016 CLINICAL DATA:  Right anterior chest pain EXAM: CHEST  2 VIEW COMPARISON:  04/15/16 FINDINGS: Cardiomediastinal stress that cardiomegaly again noted. Hyperinflation again noted. There is chronic pleural thickening and scarring in left lower hemithorax with blunting of the costophrenic angle. Stable linear scarring left perihilar. Old left rib fracture. No definite superimposed infiltrate or pulmonary edema. Atherosclerotic calcifications of thoracic aorta.  Thoracic spine osteopenia. IMPRESSION: Hyperinflation again noted. There is chronic pleural thickening and scarring in left lower hemithorax with blunting of the costophrenic angle. Stable linear scarring left perihilar. Old left rib fracture. No definite superimposed infiltrate or pulmonary edema. Electronically Signed   By: Lahoma Crocker M.D.   On: 04/24/2016 14:01   Dg Chest 2 View  Result Date: 04/15/2016 CLINICAL DATA:  Intermittent right-sided chest pain. Abdominal infection. EXAM: CHEST  2 VIEW COMPARISON:  02/28/2016.  11/23/2015.  07/25/2014. FINDINGS: There is chronic left ventricular enlargement. There is aortic atherosclerosis. There is chronic pleural and parenchymal scarring on the left, presumably secondary to old chest trauma. There is abnormal venous hypertension with interstitial edema and a small amount of fluid in the fissures. No acute bone finding. IMPRESSION: Chronic pleural and parenchymal scarring on the left. Probable fluid overload/ mild congestive heart failure superimposed. Aortic atherosclerosis. Electronically Signed   By: Nelson Chimes M.D.   On: 04/15/2016 11:34   Ct Angio Chest W/cm &/or Wo Cm  Result Date: 04/25/2016 CLINICAL DATA:  Right chest pain, shortness of breath, and elevated D-dimer. EXAM: CT ANGIOGRAPHY CHEST WITH CONTRAST TECHNIQUE: Multidetector CT imaging of the chest was performed using the standard protocol during bolus administration of intravenous contrast. Multiplanar CT image reconstructions and MIPs were obtained to evaluate the vascular anatomy. CONTRAST:  75 mL Isovue-300 COMPARISON:  Chest CTA 07/24/2013. FINDINGS: Cardiovascular: Pulmonary arterial opacification is adequate. There is a filling defect within a subsegmental pulmonary arterial branch in the right middle lobe (series 5, image 138). No segmental or more central filling defects are identified. There is thoracic aortic atherosclerosis without aneurysm. Coronary artery atherosclerosis is noted.  There is no pericardial effusion. The heart is displaced leftward in the chest related to pectus excavatum deformity. Mediastinum/Nodes: Prior right mastectomy. Surgical clips in the right axilla. No enlarged axillary, mediastinal, or hilar lymph nodes are identified. Lungs/Pleura: No pleural effusion or pneumothorax. There is chronic scarring in the left upper lobe and to a lesser extent right middle lobe. There is mild left lower lobe atelectasis. Left lung calcified granuloma is unchanged. There is mild-to-moderate centrilobular emphysema. Upper Abdomen: Abdominal aortic atherosclerosis and colonic diverticulosis are partially visualized. Musculoskeletal: Pectus excavatum. Postthoracotomy changes to multiple left ribs. Review of the MIP images confirms the above findings. IMPRESSION: 1. Subsegmental pulmonary embolus in the right middle lobe. No central emboli. 2. Chronic lung scarring and centrilobular emphysema. 3. Aortic atherosclerosis. Critical Value/emergent results were called by telephone at the time of interpretation on 04/25/2016 at 4:17 pm  to Dr. Garret Reddish , who verbally acknowledged these results. Electronically Signed   By: Logan Bores M.D.   On: 04/25/2016 16:21   Ct Abdomen Pelvis W Contrast  Result Date: 04/28/2016 CLINICAL DATA:  Left lower quadrant abdominal pain and emesis EXAM: CT ABDOMEN AND PELVIS WITH CONTRAST TECHNIQUE: Multidetector CT imaging of the abdomen and pelvis was performed using the standard protocol following bolus administration of intravenous contrast. CONTRAST:  100 ml ISOVUE-300 IOPAMIDOL (ISOVUE-300) INJECTION 61% COMPARISON:  CT abdomen pelvis 04/15/2016 FINDINGS: Lower chest: Bibasilar subsegmental atelectasis. Hepatobiliary: Normal hepatic size and contours without focal liver lesion. No perihepatic ascites. No intra- or extrahepatic biliary dilatation. Cholelithiasis without acute cholecystitis. Pancreas: Normal pancreatic contours and enhancement. No  peripancreatic fluid collection or pancreatic ductal dilatation. Spleen: Normal. Adrenals/Urinary Tract: Normal adrenal glands. Left renal sinus cysts. No hydronephrosis or solid renal mass. The bilateral calcifications at the renal hila are suspected to be vascular. Stomach/Bowel: There is extensive descending colonic and rectosigmoid diverticulosis. There is focal colonic wall thickening and and surrounding inflammatory stranding at the distal descending colon. No evidence of perforation. No fluid collection or abscess. There is no dilated small bowel. The appendix is not clearly identified. Vascular/Lymphatic: There is atherosclerotic calcification of the non aneurysmal abdominal aorta. No abdominal or pelvic adenopathy. Reproductive: No adnexal mass. Musculoskeletal: Multilevel lumbar facet arthrosis. Normal visualized extrathoracic and extraperitoneal soft tissues. Other: No contributory non-categorized findings. IMPRESSION: 1. Acute diverticulitis of the distal descending colon without abscess formation or evidence of perforation. 2. Cholelithiasis without acute inflammation. 3. Aortic atherosclerosis. Electronically Signed   By: Ulyses Jarred M.D.   On: 04/28/2016 20:14   Ct Abdomen Pelvis W Contrast  Result Date: 04/15/2016 CLINICAL DATA:  Generalized abdominal pain. EXAM: CT ABDOMEN AND PELVIS WITH CONTRAST TECHNIQUE: Multidetector CT imaging of the abdomen and pelvis was performed using the standard protocol following bolus administration of intravenous contrast. CONTRAST:  100 mL of Isovue-300 intravenously. COMPARISON:  CT scan of April 13, 2016. FINDINGS: Lower chest: Mild bibasilar subsegmental atelectasis is noted. Hepatobiliary: Small gallstone is noted.  Liver is unremarkable. Pancreas: Unremarkable. No pancreatic ductal dilatation or surrounding inflammatory changes. Spleen: Normal in size without focal abnormality. Adrenals/Urinary Tract: Adrenal glands appear normal. Left parapelvic cysts  are noted. No hydronephrosis or renal obstruction is noted. Urinary bladder appears normal. No renal or ureteral calculi are noted. Stomach/Bowel: Diverticulosis of descending and sigmoid colon is noted. Inflammatory changes seen involving proximal descending colon are significantly improved compared to prior exam, with only minimal spur residual inflammatory stranding present. There is no evidence of bowel obstruction. Vascular/Lymphatic: Aortic atherosclerosis. No enlarged abdominal or pelvic lymph nodes. Reproductive: Status post hysterectomy. No adnexal masses. Other: No abdominal wall hernia or abnormality. No abdominopelvic ascites. Musculoskeletal: Moderate degenerative disc disease is noted at L4-5. IMPRESSION: Small gallstone is noted. Aortic atherosclerosis. Diverticulitis involving proximal descending colon is significantly improved compared to prior exam, with minimal inflammatory stranding remaining. Electronically Signed   By: Marijo Conception, M.D.   On: 04/15/2016 15:02   Ct Abdomen Pelvis W Contrast  Result Date: 04/13/2016 CLINICAL DATA:  Generalized abdominal pain and fever. EXAM: CT ABDOMEN AND PELVIS WITH CONTRAST TECHNIQUE: Multidetector CT imaging of the abdomen and pelvis was performed using the standard protocol following bolus administration of intravenous contrast. CONTRAST:  72mL ISOVUE-300 IOPAMIDOL (ISOVUE-300) INJECTION 61% COMPARISON:  02/28/2016 FINDINGS: Lower Chest: No acute findings. Hepatobiliary: No masses identified. Tiny calcified gallstones again noted, without evidence of cholecystitis or biliary ductal  dilatation. Pancreas:  No mass or inflammatory changes. Spleen: Within normal limits in size and appearance. Adrenals/Urinary Tract: No masses identified. Small renal cysts noted bilaterally. No evidence of hydronephrosis. Unremarkable urinary bladder. Stomach/Bowel: Diffuse colonic diverticulosis noted which is most severe in the sigmoid colon. Mild colonic wall  thickening and pericolonic inflammatory changes are seen involving the proximal descending colon and area of diverticular disease, consistent with mild diverticulitis. No evidence of extraluminal air or abscess. Vascular/Lymphatic: No pathologically enlarged lymph nodes. No abdominal aortic aneurysm. Aortic atherosclerosis. Reproductive: Prior hysterectomy noted. Adnexal regions are unremarkable in appearance. Other:  None. Musculoskeletal:  No suspicious bone lesions identified. IMPRESSION: Mild diverticulitis involving the proximal descending colon. No evidence of abscess or other complication. Cholelithiasis.  No radiographic evidence of cholecystitis. Aortic atherosclerosis. Electronically Signed   By: Earle Gell M.D.   On: 04/13/2016 15:49    Noele Icenhour, DO  [facility] Pager [phone]  If 7PM-7AM, please contact night-coverage [email or web address] Password [email or web address] 04/29/2016, 9:43 AM   LOS: 0 days

## 2016-04-29 NOTE — Progress Notes (Signed)
Responded to spiritual care consult to visit with patient and to provide prayer.  Patient is sleeping. I chose to let patient sleep and I prayed over her as she slept.  Chaplain available as needed.  Jaclynn Major, Pager [phone]

## 2016-04-29 NOTE — Progress Notes (Addendum)
Pharmacy Antibiotic Note  [name] [name] is a 82 y.o. female admitted on 04/28/2016 with abdominal pain and emesis despite completing ABX course for diverticulitis 1wk ago, CT today reveals acute diverticulitis.  Pharmacy has been consulted for Merrem dosing.  Plan: Merrem 1g IV Q12H.  Height: 5\' 2"  (157.5 cm) Weight: 133 lb 9.6 oz (60.6 kg) IBW/kg (Calculated) : 50.1  Temp (24hrs), Avg:97.9 F (36.6 C), Min:97.5 F (36.4 C), Max:98.4 F (36.9 C)   Recent Labs Lab 04/24/16 1232 04/28/16 1613  WBC 6.2 9.0  CREATININE 0.71 0.81    Estimated Creatinine Clearance: 39.6 mL/min (by C-G formula based on SCr of 0.81 mg/dL).    Allergies  Allergen Reactions  . Azithromycin Hives, Diarrhea, Dermatitis and Rash  . Ciprofloxacin Swelling  . Levaquin [Levofloxacin] Hives  . Penicillins Hives    has had mild doses that she did not have reactions to  Has patient had a PCN reaction causing immediate rash, facial/tongue/throat swelling, SOB or lightheadedness with hypotension:Yes Has patient had a PCN reaction causing severe rash involving mucus membranes or skin necrosis: No Has patient had a PCN reaction that required hospitalization: no Has patient had a PCN reaction occurring within the last 10 years: no If all of the above answers are "NO", then may proceed with Cephalosporin use.   . Sulfamethoxazole Nausea And Vomiting     Wynona Neat, PharmD, BCPS  04/29/2016 12:18 AM

## 2016-04-29 NOTE — Evaluation (Signed)
Physical Therapy Evaluation Patient Details Name: [name] [name] MRN: [id number] DOB: August 09, 1923 Today's Date: 04/29/2016   History of Present Illness  Pt is a 82 yo female admitted under observation on 04/28/16 for abdominal pain. Pt had a recent hospitalization earlier this month for diverticulitis and continues to be suffering with this diagnosis and will be restarted on antibiotics per MD. Pt was recently diganosed with a PE 04/25/16 and was started on Xarelto. PMH significant for Stenotic airway, Afib, CAD, COPD, diverticulitis 04/13/16, and dementia.   Clinical Impression  Pt presents with the above diagnosis and below deficits. Prior to admission, pt lives alone in a 3rd floor apartment of an ILF receiving assistance PRN and was walking to meals which are on the first floor. Pt is required to walk to elevator to get downstairs for meals. Pt becomes weak following gait this session and is fatigued when she returns to her room. Pt would benefit from SNF placement in order to maximize function or possible placement in ALF for a higher level of care. Pt will benefit from continuing to be seen acutely in order to address the below deficits to assist with smooth transition to next venue of care.     Follow Up Recommendations SNF;Home health PT;Supervision for mobility/OOB;Supervision/Assistance - 24 hour    Equipment Recommendations  None recommended by PT    Recommendations for Other Services       Precautions / Restrictions Precautions Precautions: Fall Precaution Comments: no recent falls, but is at risk due to decrease in mobility Restrictions Weight Bearing Restrictions: No      Mobility  Bed Mobility Overal bed mobility: Needs Assistance Bed Mobility: Supine to Sit     Supine to sit: Min assist     General bed mobility comments: Min A to bring le's EOB and to assist at trunk.   Transfers Overall transfer level: Needs assistance Equipment used: Rolling walker (2  wheeled) Transfers: Sit to/from Stand Sit to Stand: Min assist         General transfer comment: Min a for safety to stablize initially upon standing  Ambulation/Gait Ambulation/Gait assistance: Min guard Ambulation Distance (Feet): 100 Feet Assistive device: Rolling walker (2 wheeled) Gait Pattern/deviations: Step-through pattern;Decreased stride length Gait velocity: decreased Gait velocity interpretation: Below normal speed for age/gender General Gait Details: Min guard for safety. Pt becomes fatigued as gait distance increases  Stairs            Wheelchair Mobility    Modified Rankin (Stroke Patients Only)       Balance Overall balance assessment: Needs assistance Sitting-balance support: Feet supported;No upper extremity supported Sitting balance-Leahy Scale: Fair Sitting balance - Comments: sitting EOB with no back support   Standing balance support: No upper extremity supported;During functional activity Standing balance-Leahy Scale: Fair Standing balance comment: able to stand at sink to wash hands and lets go of walker briefly                             Pertinent Vitals/Pain Pain Assessment: Faces Faces Pain Scale: Hurts little more Pain Location: abdomen Pain Descriptors / Indicators: Guarding;Cramping;Dull Pain Intervention(s): Monitored during session;Premedicated before session    [place] expects to be discharged to:: Private residence Living Arrangements: Alone Available Help at Discharge: Family;Available PRN/intermittently Type of Home: Independent living facility Home Access: Level entry     Home Layout: One level;Other (Comment) (pt lives on 3rd floor and has to use elevator)  Home Equipment: Walker - 4 wheels;Grab bars - tub/shower;Shower seat;Walker - 2 wheels Additional Comments: pt lives in [place]. [place] are handicapped height, she has railings, walk-in shower. Lives at the end of a hall on  3rd floor which is about 250-300' to elevator    Prior Function Level of Independence: Independent with assistive device(s)         Comments: pt has daughter who assists PRN, she walks to meals but meals can be brought to her room     Hand Dominance   Dominant Hand: Right    Extremity/Trunk Assessment   Upper Extremity Assessment Upper Extremity Assessment: Defer to OT evaluation    Lower Extremity Assessment Lower Extremity Assessment: Generalized weakness    Cervical / Trunk Assessment Cervical / Trunk Assessment: Kyphotic  Communication   Communication: HOH  Cognition Arousal/Alertness: Awake/alert Behavior During Therapy: WFL for tasks assessed/performed Overall Cognitive Status: No family/caregiver present to determine baseline cognitive functioning                 General Comments: pt appears oriented to person and place. No confusion noted and is able to answer questions about living situation    General Comments      Exercises     Assessment/Plan    PT Assessment Patient needs continued PT services  PT Problem List Decreased strength;Decreased activity tolerance;Decreased balance;Decreased mobility          PT Treatment Interventions Gait training;Functional mobility training;Therapeutic activities;Therapeutic exercise;Balance training;Patient/family education    PT Goals (Current goals can be found in the Care Plan section)  Acute Rehab PT Goals Patient Stated Goal: to get back home PT Goal Formulation: With patient Time For Goal Achievement: 05/06/16 Potential to Achieve Goals: Good    Frequency Min 3X/week   Barriers to discharge Decreased caregiver support pt will require cg assistance to safety function at home due to weakness    Co-evaluation               End of Session Equipment Utilized During Treatment: Gait belt Activity Tolerance: Patient tolerated treatment well;No increased pain Patient left: in chair;with call  bell/phone within reach Nurse Communication: Mobility status    Functional Assessment Tool Used: clinical judgement Functional Limitation: Mobility: Walking and moving around Mobility: Walking and Moving Around Current Status [id number]): At least 20 percent but less than 40 percent impaired, limited or restricted Mobility: Walking and Moving Around Goal Status [phone]): At least 1 percent but less than 20 percent impaired, limited or restricted    Time: 0905-0937 PT Time Calculation (min) (ACUTE ONLY): 32 min   Charges:   PT Evaluation $PT Eval Low Complexity: 1 Procedure PT Treatments $Gait Training: 8-22 mins   PT G Codes:   PT G-Codes **NOT FOR INPATIENT CLASS** Functional Assessment Tool Used: clinical judgement Functional Limitation: Mobility: Walking and moving around Mobility: Walking and Moving Around Current Status [id number]): At least 20 percent but less than 40 percent impaired, limited or restricted Mobility: Walking and Moving Around Goal Status [phone]): At least 1 percent but less than 20 percent impaired, limited or restricted    Scheryl Marten PT, DPT  [phone]  04/29/2016, 10:42 AM

## 2016-04-29 NOTE — Progress Notes (Signed)
LCSW aware of consult for possible placement. At this time patient is in Medicare Observation Status and unless meeting inpatient criteria (which is being reviewed) patient will have to pay out of pocket for SNF.  Patient also has a [place] consult. Patient currently resides at [facility] ([place]: [place]) with home health at facility: [facility].  LCSW will continue to follow and assist with disposition of needs.  Lane Hacker, MSW Clinical Social Work: [employer] Coverage for :  [phone]

## 2016-04-29 NOTE — Progress Notes (Signed)
   04/29/16 0900  Clinical Encounter Type  Visited With Patient not available;Health care provider  Visit Type Initial  Referral From Nurse  Consult/Referral To Chaplain  Recommendations (Follow up for prayer)    Pt req prayer but pt currently working with physical therapist, nurse req chaplain attempt to come back this afternoon or tomorrow.

## 2016-04-29 NOTE — Consult Note (Signed)
Consultation Note Date: 04/29/2016   Patient Name: [name] [name]  DOB: [birth year]  MRN: [id number]  Age / Sex: 82 y.o., female  PCP: Marin Olp, MD Referring Physician: Orson Eva, MD  Reason for Consultation: Establishing goals of care  HPI/Patient Profile: 82 y.o. female  with past medical history of esophageal stricture, pulmonary fibrosis, colitis, Afib and dementia presented to the ER on 12/18 with abdominal pain likely caused by diverticulitis refractory to antibiotic treatment.  Per the H&P the patient had also recently been treated for UTI and chronic chest pain.  The diverticulitis was initially treated during her hospital admission starting on 12/3 with rocephin and flagyl.  CT scan of the abd/pelvis on 12/18 shows acute diverticulitis of the distal descending colon without abscess or perforation.  (previous CT 12/5 showed diverticulitis in the proximal descending colon).  The patient as admitted and started on IV cipro / flagyl.  [name] [name] tells me that her abdominal pain is on the right side, it is significant and continuous.  It prevents her from eating and sleeping.  She found nothing to relieve it at home and therefore came to the hospital.    Prior to admission the patient lived in independent living and walked with a walker.  Her primary support is her daughter [name].  She is [place], but no longer attends services.  Her current albumin is 3.2.     Clinical Assessment and Goals of Care: [name] [name] is sitting in a recliner chair reading the paper. She is very hard of hearing and has mild to moderate dementia. I feel her family would need to be present for a productive [place] discussion.  I have spoken with Barton Fanny on the phone and we will meet in person on 12/20  Primary Decision Maker:  NEXT OF KIN  Gaynelle Adu, daughter    SUMMARY OF RECOMMENDATIONS    Family meeting 12/20 at 1  pm.  Code Status/Advance Care Planning:  Full code    Symptom Management:   Per primary team  Prognosis:   Unable to determine  Discharge Planning: [facility] for rehab with Palliative care service follow-up      Primary Diagnoses: Present on Admission: . COPD with emphysema Gold B . Pulmonary nodules . Insomnia   I have reviewed the medical record, interviewed the patient and family, and examined the patient. The following aspects are pertinent.  Past Medical History:  Diagnosis Date  . Airway obstruction   . Allergic rhinitis   . Anal stricture   . ANXIETY 08/16/2008  . Atrial fibrillation ([place])    she denies known hx of afib 02/24/13  . CAD, UNSPECIFIED SITE 10/17/2008  . Candida esophagitis ([place])   . CHOLELITHIASIS 08/23/2008  . Chronic constipation   . Colitis    sigmoid  . Complication of anesthesia    difficult intubation  . COPD 01/04/2010   FeV1 101%, DLCO64% 2008  . COPD (chronic obstructive pulmonary disease) ([place]) 01/04/2010  . 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  Diverticulitis   . DIVERTICULOSIS-COLON 04/07/2008  . Emphysema of lung ([place])   . Esophageal stricture   . Fatty liver   . GERD 04/19/2007  . Hearing aid worn    bilateral  . Hiatal hernia   . History of diverticulitis of colon   . HYPERSOMNIA, ASSOCIATED WITH SLEEP APNEA 02/01/2007  . HYPERTENSION 11/12/2006  . IBS (irritable bowel syndrome)   . MITRAL VALVE PROLAPSE, HX OF 10/07/2008  . NEOPLASM, MALIGNANT, BREAST, RIGHT 02/15/2008  . OSTEOPOROSIS 04/04/2008  . Pulmonary fibrosis ([place])   . Raynaud's syndrome 04/25/2009  . Sleep apnea   . Tortuous colon   . UTI (urinary tract infection)    Social History   Social History  . Marital status: Widowed    Spouse name: N/A  . Number of children: 3  . Years of education: N/A   Occupational History  . retireed Retired   Social History Main Topics  . Smoking status: Former Smoker    Packs/day: 2.00     Years: 30.00    Types: Cigarettes    Quit date: 05/13/1983  . Smokeless tobacco: Never Used  . Alcohol use 0.0 oz/week     Comment: 1-2 glaases wine per day  . Drug use: No  . Sexual activity: No   Other Topics Concern  . Not on file   Social History Narrative   Living in assisted living at [facility]. Widowed 1991. 3 children. 7 grandkids. No greatgrandkids.       Walks down for her meals with a walker.    ADL-bath, clothe, cleaning   IADL-daughter does finances      HCPOA- daughter Janan Ridge   Full code for now- have counseled and advised consider DNR/DNI   States enjoys living and would want resuscitation currently      [facility]:   Patient is originally from [place]. Has traveled to nearly ever one of the [place] states. She has prior travel to [place] & the [place]. Previously enjoyed gardening. 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 Remote bird exposure to a canary as a child. Previously had bird houses in her yard that she cleaned out.    Family History  Problem Relation Age of Onset  . Parkinsonism Father   . Coronary artery disease Father   . Stroke Mother   . Heart failure Mother   . Breast cancer      aunt  . Colon cancer      aunt, age 34  . Uterine cancer      aunt  . Breast cancer Maternal Aunt   . Colon cancer Maternal Aunt   . Uterine cancer Maternal Aunt   . Lung disease Neg Hx    Scheduled Meds: . apixaban  10 mg Oral BID   Followed by  . [START ON 05/05/2016] apixaban  5 mg Oral BID  . aspirin EC  81 mg Oral QHS  . donepezil  10 mg Oral QHS  . feeding supplement (ENSURE ENLIVE)  237 mL Oral BID BM  . meropenem (MERREM) IV  1 g Intravenous Q12H  . metoCLOPramide (REGLAN) injection  5 mg Intravenous Q8H  . mometasone-formoterol  2 puff Inhalation BID   Continuous Infusions: . sodium chloride 75 mL/hr at 04/29/16 0837   PRN Meds:.acetaminophen **OR** acetaminophen, albuterol, diazepam, diclofenac sodium,  ondansetron **OR** ondansetron (ZOFRAN) IV Allergies  Allergen Reactions  . Azithromycin Hives, Diarrhea, Dermatitis and Rash  . Ciprofloxacin Swelling  .  Levaquin [Levofloxacin] Hives  . Penicillins Hives    has had mild doses that she did not have reactions to  Has patient had a PCN reaction causing immediate rash, facial/tongue/throat swelling, SOB or lightheadedness with hypotension:Yes Has patient had a PCN reaction causing severe rash involving mucus membranes or skin necrosis: No Has patient had a PCN reaction that required hospitalization: no Has patient had a PCN reaction occurring within the last 10 years: no If all of the above answers are "NO", then may proceed with Cephalosporin use.   . Sulfamethoxazole Nausea And Vomiting   Review of Systems  Constitutional: Positive for activity change, appetite change and fatigue.  HENT: Negative.   Eyes: Negative.   Respiratory: Negative.   Cardiovascular: Negative.   Gastrointestinal: Positive for abdominal distention and abdominal pain.  Endocrine: Negative.   Genitourinary: Negative.   Musculoskeletal: Positive for myalgias.  Skin: Negative.   Neurological: Positive for weakness.  Hematological: Negative.   Psychiatric/Behavioral: Negative.    10 sys  Physical Exam  Constitutional: She appears well-developed.  Elderly, frail.  HENT:  Head: Normocephalic and atraumatic.  Eyes: EOM are normal. Pupils are equal, round, and reactive to light. No scleral icterus.  Neck: Neck supple. No thyromegaly present.  Cardiovascular: Normal rate.   Murmur heard. Pulmonary/Chest: Effort normal and breath sounds normal.  Abdominal: Soft.  Tender to light palpation in all 4 quadrants.  Musculoskeletal: She exhibits no edema or deformity.  Neurological: She is alert.  Very hard of hearing.  Mild confusion.  Skin: Skin is warm and dry.  Psychiatric:  Calm, appropriate, cooperative.     Vital Signs: BP (!) 122/54 (BP Location: Left  Arm)   Pulse 67   Temp 97.7 F (36.5 C) (Oral)   Resp 16   Ht 5\' 2"  (1.575 m)   Wt 60.6 kg (133 lb 9.6 oz)   SpO2 94%   BMI 24.44 kg/m  Pain Assessment: 0-10   Pain Score: Asleep   SpO2: SpO2: 94 % O2 Device:SpO2: 94 % O2 Flow Rate: .O2 Flow Rate (L/min): 2 L/min  IO: Intake/output summary:  Intake/Output Summary (Last 24 hours) at 04/29/16 1040 Last data filed at 04/29/16 0525  Gross per 24 hour  Intake              811 ml  Output              150 ml  Net              661 ml    LBM: Last BM Date: 04/28/16 Baseline Weight: Weight: 60.6 kg (133 lb 9.6 oz) Most recent weight: Weight: 60.6 kg (133 lb 9.6 oz)     Palliative Assessment/Data:   Flowsheet Rows   Flowsheet Row Most Recent Value  Intake Tab  Referral Department  Hospitalist  Unit at Time of Referral  Med/Surg Unit  Palliative Care Primary Diagnosis  Other (Comment)  Date Notified  04/28/16  Palliative Care Type  New Palliative care  Reason for referral  Clarify Goals of Care  Date of Admission  04/28/16  Date first seen by Palliative Care  04/29/16  # of days Palliative referral response time  1 Day(s)  # of days IP prior to Palliative referral  0  Clinical Assessment  Palliative Performance Scale Score  40%  Pain Max last 24 hours  5  Pain Min Last 24 hours  3  Psychosocial & Spiritual Assessment  Palliative Care Outcomes  Patient/Family meeting held?  Yes  Who was at the meeting?  Patient and dtr (dtr on phone)  Palliative Care Outcomes  Clarified goals of care      Time In: 8:15 Time Out: 9:15 Time Total: 60 min. Greater than 50%  of this time was spent counseling and coordinating care related to the above assessment and plan.  Signed by: Imogene Burn, PA-C Palliative Medicine Pager: [phone]  Please contact Palliative Medicine Team phone at [phone] for questions and concerns.  For individual provider: See Shea Evans

## 2016-04-29 NOTE — Discharge Instructions (Signed)
Information on my medicine - ELIQUIS (apixaban)  This medication education was reviewed with me or my healthcare representative as part of my discharge preparation  Why was Eliquis prescribed for you? Eliquis was prescribed to treat blood clots that may have been found in the veins of your legs (deep vein thrombosis) or in your lungs (pulmonary embolism) and to reduce the risk of them occurring again.  What do You need to know about Eliquis ? The starting dose is 10 mg (two 5 mg tablets) taken TWICE daily for the FIRST SEVEN (7) DAYS, then on Monday, 05/05/16  the dose is reduced to ONE 5 mg tablet taken TWICE daily.  Eliquis may be taken with or without food.   Try to take the dose about the same time in the morning and in the evening. If you have difficulty swallowing the tablet whole please discuss with your pharmacist how to take the medication safely.  Take Eliquis exactly as prescribed and DO NOT stop taking Eliquis without talking to the doctor who prescribed the medication.  Stopping may increase your risk of developing a new blood clot.  Refill your prescription before you run out.  After discharge, you should have regular check-up appointments with your healthcare provider that is prescribing your Eliquis.    What do you do if you miss a dose? If a dose of ELIQUIS is not taken at the scheduled time, take it as soon as possible on the same day and twice-daily administration should be resumed. The dose should not be doubled to make up for a missed dose.  Important Safety Information A possible side effect of Eliquis is bleeding. You should call your healthcare provider right away if you experience any of the following: ? Bleeding from an injury or your nose that does not stop. ? Unusual colored urine (red or dark brown) or unusual colored stools (red or black). ? Unusual bruising for unknown reasons. ? A serious fall or if you hit your head (even if there is no  bleeding).  Some medicines may interact with Eliquis and might increase your risk of bleeding or clotting while on Eliquis. To help avoid this, consult your healthcare provider or pharmacist prior to using any new prescription or non-prescription medications, including herbals, vitamins, non-steroidal anti-inflammatory drugs (NSAIDs) and supplements.  This website has more information on Eliquis (apixaban): http://www.eliquis.com/eliquis/home

## 2016-04-29 NOTE — Progress Notes (Signed)
    CT scan results reviewed showing persistent diverticulitis. Patient to be restarted on anabolic therapy including ciprofloxacin and Flagyl. Patient may need extended course. Additionally patient's condition more appropriate for Eliquis given her age. Will stop Xarelto and start Eliquis.   Linna Darner, MD [facility] 04/29/2016, 7:09 AM

## 2016-04-30 ENCOUNTER — Inpatient Hospital Stay (HOSPITAL_COMMUNITY): Payer: Medicare Other

## 2016-04-30 DIAGNOSIS — I2699 Other pulmonary embolism without acute cor pulmonale: Secondary | ICD-10-CM

## 2016-04-30 DIAGNOSIS — Z7189 Other specified counseling: Secondary | ICD-10-CM

## 2016-04-30 LAB — BASIC METABOLIC PANEL
Anion gap: 6 (ref 5–15)
BUN: 6 mg/dL (ref 6–20)
CHLORIDE: 106 mmol/L (ref 101–111)
CO2: 26 mmol/L (ref 22–32)
CREATININE: 0.71 mg/dL (ref 0.44–1.00)
Calcium: 8.4 mg/dL — ABNORMAL LOW (ref 8.9–10.3)
GFR calc Af Amer: >60 mL/min (ref >60)
GFR calc non Af Amer: >60 mL/min (ref >60)
Glucose, Bld: 102 mg/dL — ABNORMAL HIGH (ref 65–99)
Potassium: 4.1 mmol/L (ref 3.5–5.1)
Sodium: 138 mmol/L (ref 135–145)

## 2016-04-30 LAB — H PYLORI, IGM, IGG, IGA AB

## 2016-04-30 LAB — URINE CULTURE

## 2016-04-30 LAB — ECHOCARDIOGRAM COMPLETE
HEIGHTINCHES: 62 in
WEIGHTICAEL: 2137.58 [oz_av]

## 2016-04-30 LAB — VITAMIN B12: Vitamin B-12: 313 pg/mL (ref 180–914)

## 2016-04-30 LAB — MAGNESIUM: Magnesium: 1.9 mg/dL (ref 1.7–2.4)

## 2016-04-30 NOTE — [place] FL2 (Signed)
[place] MEDICAID FL2 LEVEL OF CARE SCREENING TOOL     IDENTIFICATION  Patient Name: [name] [name] Birthdate: Aug 01, 1923 Sex: female Admission Date (Current Location): 04/28/2016  [facility] and [place] Number:  Herbalist and Address:  [facility] [place]. [facility], [place] [address] [place], [place] [address]      Provider Number: [id number]  Attending Physician Name and Address:  Cristal Ford, DO  Relative Name and Phone Number:       Current Level of Care: Hospital Recommended Level of Care: [place] Prior Approval Number:    Date Approved/Denied:   PASRR Number:  [id number]   Discharge Plan: SNF    Current Diagnoses: Patient Active Problem List   Diagnosis Date Noted  . Acute diverticulitis 04/29/2016  . Palliative care encounter   . Abdominal pain 04/28/2016  . Acute encephalopathy 04/28/2016  . Generalized weakness 04/28/2016  . Acute pulmonary embolism ([place]) 04/28/2016  . COPD exacerbation ([place]) 04/28/2016  . UTI (urinary tract infection) 04/13/2016  . Diverticulitis 04/13/2016  . Sepsis ([place]) 04/13/2016  . Diastolic dysfunction 0000000  . Abdominal aortic atherosclerosis ([place]) 03/08/2016  . Syncope 02/23/2016  . Allergic rhinitis 04/12/2014  . Constipation 04/12/2014  . Alzheimer's dementia 04/12/2014  . Osteoarthritis, knee 04/12/2014  . Insomnia 11/21/2013  . History of CVA (cerebrovascular accident) 07/29/2013  . Pulmonary nodules 07/29/2013  . Recurrent UTI 07/13/2013  . History of breast cancer, T1b, N0, Lumpectomy 07/21/2007. 04/21/2011  . Chest pain, atypical 01/22/2011  . COPD with emphysema Gold B 01/04/2010  . Raynaud's syndrome 04/25/2009  . GERD 04/19/2007  . Essential hypertension 11/12/2006    Orientation RESPIRATION BLADDER Height & Weight     Self, Place  O2 (2L) Continent Weight: 133 lb 9.6 oz (60.6 kg) Height:  5\' 2"  (157.5 cm)  BEHAVIORAL SYMPTOMS/MOOD NEUROLOGICAL BOWEL NUTRITION STATUS    Continent Diet (regular/see DC summary)  AMBULATORY STATUS COMMUNICATION OF NEEDS Skin   Limited Assist Verbally Normal                       Personal Care Assistance Level of Assistance  Bathing, Feeding, Dressing Bathing Assistance: Limited assistance Feeding assistance: Independent Dressing Assistance: Limited assistance     Functional Limitations Info  Sight, Hearing, Speech Sight Info: Adequate Hearing Info: Impaired Speech Info: Adequate    SPECIAL CARE FACTORS FREQUENCY  PT (By licensed PT), OT (By licensed OT)     PT Frequency: 5x a week OT Frequency: 5x a week            Contractures Contractures Info: Not present    Additional Factors Info  Code Status, Allergies Code Status Info: Full Code Allergies Info: Azithromycin, Ciprofloxacin, Levaquin Levofloxacin, Penicillins, Sulfamethoxazole           Current Medications (04/30/2016):  This is the current hospital active medication list Current Facility-Administered Medications  Medication Dose Route Frequency Provider Last Rate Last Dose  . 0.9 %  sodium chloride infusion   Intravenous Continuous Waldemar Dickens, MD 75 mL/hr at 04/30/16 [id number]    . acetaminophen (TYLENOL) tablet 650 mg  650 mg Oral Q6H PRN Waldemar Dickens, MD       Or  . acetaminophen (TYLENOL) suppository 650 mg  650 mg Rectal Q6H PRN Waldemar Dickens, MD      . albuterol (PROVENTIL) (2.5 MG/3ML) 0.083% nebulizer solution 2.5 mg  2.5 mg Inhalation Q6H PRN Waldemar Dickens, MD      .  apixaban (ELIQUIS) tablet 10 mg  10 mg Oral BID Rolla Flatten, RPH   10 mg at 04/30/16 1134   Followed by  . [START ON 05/05/2016] apixaban (ELIQUIS) tablet 5 mg  5 mg Oral BID Rolla Flatten, [facility]      . aspirin EC tablet 81 mg  81 mg Oral QHS Waldemar Dickens, MD   81 mg at 04/29/16 2150  . diazepam (VALIUM) tablet 5 mg  5 mg Oral QHS PRN Waldemar Dickens, MD   5 mg at 04/29/16 2150  . diclofenac sodium (VOLTAREN) 1 % transdermal gel 4 g  4 g Topical  QID PRN Waldemar Dickens, MD      . donepezil (ARICEPT) tablet 10 mg  10 mg Oral QHS Waldemar Dickens, MD   10 mg at 04/29/16 2150  . feeding supplement (ENSURE ENLIVE) (ENSURE ENLIVE) liquid 237 mL  237 mL Oral BID BM Waldemar Dickens, MD   237 mL at 04/30/16 1135  . meropenem (MERREM) 1 g in sodium chloride 0.9 % 100 mL IVPB  1 g Intravenous Q12H Veronda P Bryk, RPH   1 g at 04/30/16 1134  . metoCLOPramide (REGLAN) injection 5 mg  5 mg Intravenous Q8H Waldemar Dickens, MD   5 mg at 04/30/16 0542  . mometasone-formoterol (DULERA) 200-5 MCG/ACT inhaler 2 puff  2 puff Inhalation BID Waldemar Dickens, MD   2 puff at 04/30/16 [phone]  . ondansetron (ZOFRAN) tablet 4 mg  4 mg Oral Q6H PRN Waldemar Dickens, MD       Or  . ondansetron [facility]) injection 4 mg  4 mg Intravenous Q6H PRN Waldemar Dickens, MD         Discharge Medications: Please see discharge summary for a list of discharge medications.  Relevant Imaging Results:  Relevant Lab Results:   Additional Information SSN:  [id number]  Lilly Cove, [place]

## 2016-04-30 NOTE — Progress Notes (Signed)
OT Cancellation Note  Patient Details Name: [name] [name] MRN: [id number] DOB: 1923/10/16   Cancelled Treatment:    Reason Eval/Treat Not Completed: Fatigue/lethargy limiting ability to participate. Pt in bed and stated that she just got back into bed and will get back up later with therapy. Will check back later as able  Britt Bottom 04/30/2016, 10:44 AM

## 2016-04-30 NOTE — Clinical Social Work Placement (Signed)
   CLINICAL SOCIAL WORK PLACEMENT  NOTE  Date:  04/30/2016  Patient Details  Name: [name] [name] MRN: [id number] Date of Birth: 11-12-1923  Clinical Social Work is seeking post-discharge placement for this patient at the [place] level of care (*CSW will initial, date and re-position this form in  chart as items are completed):  Yes   Patient/family provided with [place] Work Department's list of facilities offering this level of care within the geographic area requested by the patient (or if unable, by the patient's family).  Yes   Patient/family informed of their freedom to choose among providers that offer the needed level of care, that participate in Medicare, Medicaid or managed care program needed by the patient, have an available bed and are willing to accept the patient.  Yes   Patient/family informed of [facility]'s ownership interest in [facility] and [facility], as well as of the fact that they are under no obligation to receive care at these facilities.  PASRR submitted to EDS on       PASRR number received on       Existing PASRR number confirmed on 04/30/16     FL2 transmitted to all facilities in geographic area requested by pt/family on 04/30/16     FL2 transmitted to all facilities within larger geographic area on       Patient informed that his/her managed care company has contracts with or will negotiate with certain facilities, including the following:            Patient/family informed of bed offers received.  Patient chooses bed at       Physician recommends and patient chooses bed at      Patient to be transferred to   on  .  Patient to be transferred to facility by       Patient family notified on   of transfer.  Name of family member notified:        PHYSICIAN Please sign FL2     Additional Comment:    _______________________________________________ Lilly Cove, LCSW 04/30/2016, 2:37 PM

## 2016-04-30 NOTE — Progress Notes (Signed)
  Echocardiogram 2D Echocardiogram has been performed.  [name] [name] 04/30/2016, 3:39 PM

## 2016-04-30 NOTE — Care Management Note (Signed)
Case Management Note  Patient Details  Name: [name] [name] MRN: [id number] Date of Birth: 08/15/1923  Subjective/Objective:                    Action/Plan:   Expected Discharge Date:                  Expected Discharge Plan:  Skilled Nursing Facility  In-House Referral:  Clinical Social Work  Discharge planning Services     Post Acute Care Choice:    Choice offered to:     DME Arranged:    DME Agency:     HH Arranged:    [place] Agency:     Status of Service:  In process, will continue to follow  If discussed at Long Length of Stay Meetings, dates discussed:    Additional Comments:  Marilu Favre, RN 04/30/2016, 10:29 AM

## 2016-04-30 NOTE — Clinical Social Work Note (Signed)
Clinical Social Work Assessment  Patient Details  Name: [name] [name] MRN: [id number] Date of Birth: 04/23/1924  Date of referral:  04/30/16               Reason for consult:  Facility Placement, Discharge Planning                Permission sought to share information with:  Case Manager, Customer service manager, Family Supports Permission granted to share information::  Yes, Verbal Permission Granted  Name::        Agency::  SNF HUB  Relationship::  Daughter  Contact Information:     Housing/Transportation Living arrangements for the past 2 months:  Engineer, maintenance (IT) (ILF at [employer]) Source of Information:  Patient, Scientist, water quality, Tourist information centre manager, Adult Children Patient Interpreter Needed:  None Criminal Activity/Legal Involvement Pertinent to Current Situation/Hospitalization:  No - Comment as needed Significant Relationships:  Adult Children, Other Family Members Lives with:  Self Do you feel safe going back to the place where you live?  No Need for family participation in patient care:  Yes (Comment)  Care giving concerns:  LCSW spoke with patient's daughter who reports patient is a long term resident at [facility].  Daughter reports she does not feel that patient is safe to return home to apartment quite yet and is agreeable to ST SNF.  Daughter requests [facility] as patient has been there in the past, with last admission 2015.    Social Worker assessment / plan:  LCSW received consult for facility placement. SNF work up completed and awaiting review for bed offers. Will follow up with daughter regarding bed offers.   Plan: SNF at DC.  Employment status:  Retired Forensic scientist:  [employer] PT Recommendations:  [place] / Referral to community resources:  [place]  Patient/Family's Response to care:  Agreeable to plan  Patient/Family's Understanding of and Emotional Response to Diagnosis, Current Treatment,  and Prognosis:  Daughter understanding and involved in care for patient and understanding of plan.  Emotional Assessment Appearance:  Appears stated age Attitude/Demeanor/Rapport:  Other (cooperative and pleasant) Affect (typically observed):  Accepting, Adaptable, Pleasant Orientation:  Oriented to Self, Oriented to Place Alcohol / Substance use:  Not Applicable Psych involvement (Current and /or in the community):  No (Comment)  Discharge Needs  Concerns to be addressed:  No discharge needs identified Readmission within the last 30 days:  No Current discharge risk:  None Barriers to Discharge:  Continued Medical Work up   Lilly Cove, LCSW 04/30/2016, 2:30 PM

## 2016-04-30 NOTE — Progress Notes (Signed)
Daily Progress Note   Patient Name: [name] [name]       Date: 04/30/2016 DOB: 12/18/1923  Age: 82 y.o. MRN#: [id number] Attending Physician: Cristal Ford, DO Primary Care Physician: Garret Reddish, MD Admit Date: 04/28/2016  Reason for Consultation/Follow-up: Establishing goals of care  Subjective: Patient states she still has abdominal pain but is very hungry and wants to eat.  She tells me chews her food very carefully and eats very slowly due to her esophageal stricture.  She is excited that her grand dtr and great grand son are visiting.  I met with the patient's daughter [name] [name]) and her grand daughter in the conference room.  Her daughter would like for her to d/c to skilled rehab for ST rehabilitation prior to returning to independent living if possible.  She has been to [facility] 3 or 4 times in the past and had good experiences there.    [name] [name] provided me with her mother's living will which I copied and left on the chart.  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  Her living will is very specific and states that if two doctors and her daughter advise against life support then she will forego it.  She does not want artificial nutrition at end of life.  Her daughter/grand dtr and I discussed her co-morbidities and expected life trajectory.  Including - dementia, esophageal stricture, COPD, LLL resection, lung scaring, grade 2 diastolic dysfunction, colitis/constipation and now recurrent diverticulosis.  [name] [name] has lost weight due to nausea because of her recent bouts of diverticulitis.  We discussed GI follow up (She sees Dr. Earlean Shawl).  She is unable to have a colonoscopy due to her anatomy per her daughters report.  Her daughter expressed that she would not want her mother to  go thru CPR/ACLS at this point in her life.  Further she is unable to be intubated.  We discussed Palliative Care follow up at SNF.  Length of Stay: 1  Current Medications: Scheduled Meds:  . apixaban  10 mg Oral BID   Followed by  . [START ON 05/05/2016] apixaban  5 mg Oral BID  . aspirin EC  81 mg Oral QHS  . donepezil  10 mg Oral QHS  . feeding supplement (ENSURE ENLIVE)  237 mL Oral BID BM  . meropenem (  MERREM) IV  1 g Intravenous Q12H  . metoCLOPramide (REGLAN) injection  5 mg Intravenous Q8H  . mometasone-formoterol  2 puff Inhalation BID    Continuous Infusions: . sodium chloride 75 mL/hr at 04/30/16 0420    PRN Meds: acetaminophen **OR** acetaminophen, albuterol, diazepam, diclofenac sodium, ondansetron **OR** ondansetron (ZOFRAN) IV  Physical Exam  Constitutional: She is oriented to person, place, and time. She appears well-developed.  Thin.  Pleasant.  Elderly.  Sitting in recliner  HENT:  Head: Normocephalic and atraumatic.  Eyes: EOM are normal. Pupils are equal, round, and reactive to light.  Neck: Normal range of motion. Neck supple.  Cardiovascular: Normal rate.   irreg  Pulmonary/Chest: Effort normal and breath sounds normal.  Abdominal: Soft.  Diffusely tender  Musculoskeletal: She exhibits no edema or deformity.  Neurological: She is alert and oriented to person, place, and time.  Very HOH  Skin: Skin is warm and dry.  Psychiatric: She has a normal mood and affect. Her behavior is normal. Judgment and thought content normal.           Vital Signs: BP (!) 166/59 (BP Location: Right Arm)   Pulse 60   Temp 98 F (36.7 C) (Oral)   Resp 16   Ht _0  (1.575 m)   Wt 60.6 kg (133 lb 9.6 oz)   SpO2 92%   BMI 24.44 kg/m  SpO2: SpO2: 92 % O2 Device: O2 Device: Not Delivered O2 Flow Rate: O2 Flow Rate (L/min): 2 L/min  Intake/output summary:  Intake/Output Summary (Last 24 hours) at 04/30/16 1244 Last data filed at 04/30/16 5993  Gross per 24 hour    Intake                0 ml  Output              652 ml  Net             -652 ml   LBM: Last BM Date: 04/29/16 Baseline Weight: Weight: 60.6 kg (133 lb 9.6 oz) Most recent weight: Weight: 60.6 kg (133 lb 9.6 oz)       Palliative Assessment/Data:    Flowsheet Rows   Flowsheet Row Most Recent Value  Intake Tab  Referral Department  Hospitalist  Unit at Time of Referral  Med/Surg Unit  Palliative Care Primary Diagnosis  Other (Comment)  Date Notified  04/28/16  Palliative Care Type  New Palliative care  Reason for referral  Clarify Goals of Care  Date of Admission  04/28/16  Date first seen by Palliative Care  04/29/16  # of days Palliative referral response time  1 Day(s)  # of days IP prior to Palliative referral  0  Clinical Assessment  Palliative Performance Scale Score  40%  Pain Max last 24 hours  5  Pain Min Last 24 hours  3  Psychosocial & Spiritual Assessment  Palliative Care Outcomes  Patient/Family meeting held?  Yes  Who was at the meeting?  Patient and dtr (dtr on phone)  Palliative Care Outcomes  Clarified goals of care      Patient Active Problem List   Diagnosis Date Noted  . Acute diverticulitis 04/29/2016  . Palliative care encounter   . Abdominal pain 04/28/2016  . Acute encephalopathy 04/28/2016  . Generalized weakness 04/28/2016  . Acute pulmonary embolism ([place]) 04/28/2016  . COPD exacerbation ([place]) 04/28/2016  . UTI (urinary tract infection) 04/13/2016  . Diverticulitis 04/13/2016  . Sepsis ([place]) 04/13/2016  . Diastolic  dysfunction 03/08/2016  . Abdominal aortic atherosclerosis ([place]) 03/08/2016  . Syncope 02/23/2016  . Allergic rhinitis 04/12/2014  . Constipation 04/12/2014  . Alzheimer's dementia 04/12/2014  . Osteoarthritis, knee 04/12/2014  . Insomnia 11/21/2013  . History of CVA (cerebrovascular accident) 07/29/2013  . Pulmonary nodules 07/29/2013  . Recurrent UTI 07/13/2013  . History of breast cancer, T1b, N0, Lumpectomy  07/21/2007. 04/21/2011  . Chest pain, atypical 01/22/2011  . COPD with emphysema Gold B 01/04/2010  . Raynaud's syndrome 04/25/2009  . GERD 04/19/2007  . Essential hypertension 11/12/2006    Palliative Care Assessment & Plan   Patient Profile: 82 y.o. female  with past medical history of esophageal stricture, pulmonary fibrosis, colitis, Afib and dementia presented to the ER on 12/18 with abdominal pain likely caused by diverticulitis refractory to antibiotic treatment.  Per the H&P the patient had also recently been treated for UTI and chronic chest pain.  The diverticulitis was initially treated during her hospital admission starting on 12/3 with rocephin and flagyl.  CT scan of the abd/pelvis on 12/18 shows acute diverticulitis of the distal descending colon without abscess or perforation.  (previous CT 12/5 showed diverticulitis in the proximal descending colon).  The patient as admitted and started on IV cipro / flagyl.  [name] [name] tells me that her abdominal pain is on the right side, it is significant and continuous.  It prevents her from eating and sleeping.  She found nothing to relieve it at home and therefore came to the hospital.    Prior to admission the patient lived in independent living and walked with a walker.  Her primary support is her daughter [name] [name].  She is [place], but no longer attends services.  Her current albumin is 3.2.    Assessment:  Extremely pleasant, somewhat frail, 82 yo female.  Recovering from her 2nd bout of diverticulitis.  Family requests d/c to SNF for ST rehab.  Recommendations/Plan:  To be followed by Palliative Care outpatient at [facility] or home.  Please place order in D/C summary.  Goals of Care and Additional Recommendations:  Limitations on Scope of Treatment: Full Scope Treatment and No Artificial Feeding  Code Status:  Limited code no intubation.    Prognosis:   Unable to determine  Discharge Planning:  [place] for  rehab with Palliative care service follow-up  Care plan was discussed with family and [place] attending.  Thank you for allowing the Palliative Medicine Team to assist in the care of this patient.   Time In: 1:00 Time Out: 1:50 Total Time 50 min Prolonged Time Billed no      Greater than 50%  of this time was spent counseling and coordinating care related to the above assessment and plan.  Imogene Burn, PA-C Palliative Medicine   Please contact Palliative MedicineTeam phone at [phone] for questions and concerns.   Please see AMION for individual provider pager numbers.

## 2016-04-30 NOTE — Progress Notes (Signed)
Physical Therapy Treatment Patient Details Name: [name] [name] MRN: [id number] DOB: Nov 01, 1923 Today's Date: 04/30/2016    History of Present Illness Pt is a 82 yo female admitted 04/28/16 for abdominal pain. Pt had a recent hospitalization earlier this month for diverticulitis and continues to be suffering with this diagnosis and will be restarted on antibiotics per MD. Pt was recently diganosed with a PE 04/25/16 and was started on Xarelto. PMH significant for Stenotic airway, Afib, CAD, COPD, diverticulitis 04/13/16, and dementia.     PT Comments    Pt with increased fatigue today. Pt able to ambulate with supervision to [facility] and don/doff depends as well as wipe herself without physical assist. Pt able to ambulate 100 ft with x2 rest breaks before c/o fatigue and dizziness. RN assisted to pull up chair and pt's SpO2 was 69%. SpO2 increased to 92% after seated rest break. Pt was wheeled back to room. Pt would benefit from SNF following d/c from hospital to address deficits in activity tolerance before return to home. Recommend PT to trial ambulation with O2 next session to assess home O2 needs.   SATURATION QUALIFICATIONS: (This note is used to comply with regulatory documentation for home oxygen)  Patient Saturations on Room Air while Ambulating = 69%  Patient Saturations on Room Air while seated after ambulation= 92%  Please briefly explain why patient needs home oxygen: Pt desats during ambulation. Pt would benefit from trial of O2 during ambulation to further assess home O2 needs.     Follow Up Recommendations  SNF;Supervision for mobility/OOB;Supervision/Assistance - 24 hour     Equipment Recommendations  None recommended by PT    Recommendations for Other Services       Precautions / Restrictions Precautions Precautions: Fall Precaution Comments: no recent falls, but is at risk due to decrease in mobility Restrictions Weight Bearing Restrictions: No    Mobility  Bed  Mobility Overal bed mobility: Needs Assistance Bed Mobility: Supine to Sit     Supine to sit: Min assist     General bed mobility comments: Min A to bring LEs EOB  Transfers Overall transfer level: Needs assistance Equipment used: Rolling walker (2 wheeled) Transfers: Sit to/from Stand Sit to Stand: Min assist         General transfer comment: Min a for safety  Ambulation/Gait Ambulation/Gait assistance: Min guard Ambulation Distance (Feet): 100 Feet Assistive device: Rolling walker (2 wheeled) Gait Pattern/deviations: Step-through pattern;Decreased stride length Gait velocity: decreased   General Gait Details: Min guard for safety. Pt required standing rest break x2 and c/o fatigue and dizziness after 100 ft. RN assisted in pulling chair behind. SpO2 69 but immediately increased to 92% after seated rest break. Pt wheeled back to room.   Stairs            Wheelchair Mobility    Modified Rankin (Stroke Patients Only)       Balance Overall balance assessment: Needs assistance Sitting-balance support: Feet supported;No upper extremity supported Sitting balance-Leahy Scale: Fair     Standing balance support: No upper extremity supported;During functional activity Standing balance-Leahy Scale: Poor Standing balance comment: Able to maintain balance while pulling up depends to use BSC                    Cognition Arousal/Alertness: Awake/alert Behavior During Therapy: WFL for tasks assessed/performed Overall Cognitive Status: No family/caregiver present to determine baseline cognitive functioning  Exercises      General Comments        Pertinent Vitals/Pain Pain Assessment: Faces Faces Pain Scale: Hurts little more Pain Location: abdominal area Pain Descriptors / Indicators: Grimacing;Guarding Pain Intervention(s): Limited activity within patient's tolerance;Monitored during session;Repositioned    Home Living  Family/patient expects to be discharged to:: Skilled nursing facility Living Arrangements: Alone Available Help at Discharge: Family;Available PRN/intermittently Type of Home: Independent living facility Home Access: Level entry   Home Layout: One level;Other (Comment) (uses elevator) Home Equipment: Walker - 4 wheels;Grab bars - tub/shower;Shower seat;Walker - 2 wheels Additional Comments: pt lives in [place]. Commodes are handicapped height, she has railings, walk-in shower. Lives at the end of a hall on 3rd floor which is about 250-300' to elevator    Prior Function Level of Independence: Independent with assistive device(s)      Comments: pt has daughter who assists PRN, she walks to meals but meals can be brought to her room. Independent with ADLS   PT Goals (current goals can now be found in the care plan section) Acute Rehab PT Goals Patient Stated Goal: go back home PT Goal Formulation: With patient Time For Goal Achievement: 05/06/16 Potential to Achieve Goals: Good Progress towards PT goals: Progressing toward goals    Frequency    Min 3X/week      PT Plan Discharge plan needs to be updated    Co-evaluation             End of Session Equipment Utilized During Treatment: Gait belt Activity Tolerance: Patient limited by fatigue Patient left: in bed;with call bell/phone within reach;with nursing/sitter in room     Time: [id number] PT Time Calculation (min) (ACUTE ONLY): 21 min  Charges:  $Gait Training: 8-22 mins                    G Codes:      Tonia Brooms 05/10/2016, 1:50 PM

## 2016-04-30 NOTE — Evaluation (Signed)
Occupational Therapy Evaluation Patient Details Name: [name] [name] MRN: [id number] DOB: 05/06/1924 Today's Date: 04/30/2016    History of Present Illness Pt is a 82 yo female admitted 04/28/16 for abdominal pain. Pt had a recent hospitalization earlier this month for diverticulitis and continues to be suffering with this diagnosis and will be restarted on antibiotics per MD. Pt was recently diganosed with a PE 04/25/16 and was started on Xarelto. PMH significant for Stenotic airway, Afib, CAD, COPD, diverticulitis 04/13/16, and dementia.    Clinical Impression   Pt with decline in function and safety with ADLs and ADL mobility with decreased strength, balance and endurance. Pt would benefit from acute OT services to address impairments to increase level of function and safety    Follow Up Recommendations  SNF    Equipment Recommendations  None recommended by OT    Recommendations for Other Services       Precautions / Restrictions Precautions Precautions: Fall Precaution Comments: no recent falls, but is at risk due to decrease in mobility Restrictions Weight Bearing Restrictions: No      Mobility Bed Mobility Overal bed mobility: Needs Assistance Bed Mobility: Supine to Sit     Supine to sit: Min assist     General bed mobility comments: Min A to bring LEs EOB  Transfers Overall transfer level: Needs assistance Equipment used: Rolling walker (2 wheeled) Transfers: Sit to/from Stand Sit to Stand: Min assist         General transfer comment: Min a for safety    Balance Overall balance assessment: Needs assistance Sitting-balance support: Feet supported;No upper extremity supported Sitting balance-Leahy Scale: Fair     Standing balance support: No upper extremity supported;During functional activity Standing balance-Leahy Scale: Poor Standing balance comment: Able to maintain balance while pulling up depends to use BSC                             ADL Overall ADL's : Needs assistance/impaired     Grooming: Wash/dry hands;Wash/dry face;Min guard;Standing   Upper Body Bathing: Min guard;Sitting   Lower Body Bathing: Moderate assistance   Upper Body Dressing : Sitting;Min guard   Lower Body Dressing: Moderate assistance   Toilet Transfer: Minimal assistance;Ambulation;RW;Grab bars;Cueing for safety   Toileting- Clothing Manipulation and Hygiene: Minimal assistance       Functional mobility during ADLs: Rolling walker;Minimal assistance;Cueing for safety       Vision Vision Assessment?: No apparent visual deficits              Pertinent Vitals/Pain Pain Assessment: Faces Faces Pain Scale: Hurts little more Pain Location: abdominal area Pain Descriptors / Indicators: Grimacing;Guarding Pain Intervention(s): Limited activity within patient's tolerance;Monitored during session;Repositioned     Hand Dominance Right   Extremity/Trunk Assessment Upper Extremity Assessment Upper Extremity Assessment: Generalized weakness   Lower Extremity Assessment Lower Extremity Assessment: Defer to PT evaluation   Cervical / Trunk Assessment Cervical / Trunk Assessment: Kyphotic   Communication Communication Communication: HOH   Cognition Arousal/Alertness: Awake/alert Behavior During Therapy: WFL for tasks assessed/performed Overall Cognitive Status: No family/caregiver present to determine baseline cognitive functioning                     General Comments   Pt very pleasant and cooperative                 Home Living Family/patient expects to be discharged to:: Skilled nursing facility Living  Arrangements: Alone Available Help at Discharge: Family;Available PRN/intermittently Type of Home: Independent living facility Home Access: Level entry     Home Layout: One level;Other (Comment) (uses Media planner)     Bathroom Shower/Tub: Walk-in shower;Curtain   Bathroom Toilet: Handicapped  height Bathroom Accessibility: Yes   Home Equipment: Walker - 4 wheels;Grab bars - tub/shower;Shower seat;Walker - 2 wheels   Additional Comments: pt lives in [place]. Commodes are handicapped height, she has railings, walk-in shower. Lives at the end of a hall on 3rd floor which is about 250-300' to elevator      Prior Functioning/Environment Level of Independence: Independent with assistive device(s)        Comments: pt has daughter who assists PRN, she walks to meals but meals can be brought to her room. Independent with ADLS        OT Problem List: Decreased strength;Impaired balance (sitting and/or standing);Pain;Decreased activity tolerance;Decreased knowledge of use of DME or AE   OT Treatment/Interventions: Self-care/ADL training;DME and/or AE instruction;Therapeutic activities;Patient/family education    OT Goals(Current goals can be found in the care plan section) Acute Rehab OT Goals Patient Stated Goal: go back home OT Goal Formulation: With patient Time For Goal Achievement: 04/30/16 Potential to Achieve Goals: Good ADL Goals Pt Will Perform Grooming: with supervision;with set-up;standing Pt Will Perform Upper Body Bathing: with min guard assist;with supervision;with set-up;standing Pt Will Perform Upper Body Dressing: with min guard assist;with supervision;with set-up;standing Pt Will Transfer to Toilet: with min guard assist;with supervision;ambulating;regular height toilet;grab bars Pt Will Perform Toileting - Clothing Manipulation and hygiene: with min guard assist;sitting/lateral leans;sit to/from stand  OT Frequency: Min 2X/week   Barriers to D/C: Decreased caregiver support                        End of Session Equipment Utilized During Treatment: Gait belt;Rolling walker  Activity Tolerance: Patient tolerated treatment well Patient left: in chair;with call bell/phone within reach;with chair alarm set   Time: 1209-1229 OT Time  Calculation (min): 20 min Charges:  OT General Charges $OT Visit: 1 Procedure OT Evaluation $OT Eval Moderate Complexity: 1 Procedure OT Treatments $Self Care/Home Management : 8-22 mins G-Codes:    Britt Bottom 04/30/2016, 1:49 PM

## 2016-04-30 NOTE — Progress Notes (Signed)
PROGRESS NOTE    [name] [name]  [id number] DOB: 12/01/1923 DOA: 04/28/2016 PCP: Garret Reddish, MD   No chief complaint on file.    Brief Narrative:  82 year old female with a history of dementia, recent PE (04/25/16), atrial fibrillation, pulmonary fibrosis, hypertension, breast cancer, IBS presented with worsening abdominal pain and generalized weakness over the past 3 days. The patient lives in an ILF to which she was  recently discharged from the hospital after a stay from 04/13/2016 through 04/14/2016 after treatment for diverticulitis and UTI. The patient was changed to cefpodoxime and metronidazole at the time of discharge.in addition, the patient also had a recent admission from 02/22/2016 through 02/26/2012 when she was treated for urinary tract infection and syncope. She was discharged on oral cephalexin. Her syncope was thought to be due to orthostatic hypotension. She had an emergency department visit on 02/28/2016 for abdominal pain. CT of the abdomen and pelvis at that time (02/28/16)  was negative for any acute findings, and the patient was discharged back to her ILF after receiving intravenous fluids.  After her December admission, the patient also had an additional ED visit on 04/15/2016 for chest pain and abdominal pain. Repeat CT of the abdomen and pelvis at that time showed improvement of her diverticulitis. The patient was discharged to continue her antibiotics. She was felt to be low risk for ACS. She was discharged back to her ILF. She saw her primary care physician on 04/24/2016 for continued chest pain. CT aangio of the chest was performed on 04/25/2016 which was positive for RML PE.  She was started on rivaroxaban.  Upon presentation, the patient was noted to be confused. The babysitter was 9.0, and the patient was afebrile hemodynamically stable. CT of the abdomen and pelvis on 04/28/2016 revealed acute diverticulitis of the distal descending colon. The patient was started on  meropenem. Assessment & Plan   Acute diverticulitis -Previously finished 7 day course Cefpodoxime and metronidazole on 04/21/2016 -Started meropenem -04/28/2016 CT abdomen and pelvis--acute diverticulitis distal descending colon without abscess or perforation -Tolerated full liquids, will advance to soft diet -Continue IV fluids -may need longer course abx after d/c -02/26/11 flex sig--proctitis and severe diverticulosis (Dr. Olevia Perches)  Acute metabolic encephalopathy -Secondary to infectious process -Suspect continue to functional and cognitive decline secondary to numerous acute medical conditions in the setting of established dementia -Continue Aricept  Pulmonary embolus -04/25/2016 CTA chest--RML PE -Rivaroxaban switched to apixaban -Echocardiogram pending   Chest pain -reproducible on palpation of R upper chest wall or deep breathing -cycle troponins -echocardiogram pending   Pyuria -continue merrem -Urine culture showed multiple species  Generalized weakness/failure to thrive -Progressive since October 2017 -Secondary to multiple acute and chronic medical conditions -PT/OT- PT rec SNF/HHPT, supervision  -TSH 3.251 -B12 313  COPD/pulm fibrosis: at baseline.  -No O2 at ILF -continue dulera and PRN albuterol  Goals Of Care -palliative care consulted and appreciated  DVT Prophylaxis  Eliquis  Code Status: Full  Family Communication: None at bedside  Disposition Plan: Admitted. Possibly SNF at discharge  Consultants Palliative care  Procedures  None  Antibiotics   Anti-infectives    Start     Dose/Rate Route Frequency Ordered Stop   04/29/16 0800  metroNIDAZOLE (FLAGYL) IVPB 500 mg  Status:  Discontinued     500 mg 100 mL/hr over 60 Minutes Intravenous Every 8 hours 04/29/16 0008 04/29/16 0036   04/29/16 0045  metroNIDAZOLE (FLAGYL) IVPB 500 mg  Status:  Discontinued     500  mg 100 mL/hr over 60 Minutes Intravenous  Once 04/29/16 0030 04/29/16  0036   04/29/16 0045  meropenem (MERREM) 1 g in sodium chloride 0.9 % 100 mL IVPB     1 g 200 mL/hr over 30 Minutes Intravenous Every 12 hours 04/29/16 0043        Subjective:   [name] [name] seen and examined today.  Continues to have abdominal pain. Patient states she is feeling mildly better, but would like to eat.  She is tired of jello. Denies nausea, vomiting, chest pain, shortness of breath, headache, dizziness.  Objective:   Vitals:   04/29/16 2139 04/30/16 0614 04/30/16 0929 04/30/16 0931  BP: (!) 164/62 (!) 166/59    Pulse: 62 60    Resp: 16 16    Temp: 98 F (36.7 C) 98 F (36.7 C)    TempSrc: Oral Oral    SpO2: 92% 97% 92% 92%  Weight:      Height:        Intake/Output Summary (Last 24 hours) at 04/30/16 1339 Last data filed at 04/30/16 [id number]  Gross per 24 hour  Intake                0 ml  Output              652 ml  Net             -652 ml   Filed Weights   04/28/16 1208  Weight: 60.6 kg (133 lb 9.6 oz)    Exam  General: Well developed, well nourished, NAD, appears stated age  HEENT: NCAT,  mucous membranes moist.   Cardiovascular: S1 S2 auscultated, no rubs, murmurs or gallops. Regular rate and rhythm.  Respiratory: Clear to auscultation bilaterally with equal chest rise  Abdomen: Soft, mild TTP LUQ and LLQ, nondistended, + bowel sounds  Extremities: warm dry without cyanosis clubbing or edema  Neuro: AAOx3, nonfocal (hard of hearing)  Psych: Normal affect and demeanor   Data Reviewed: I have personally reviewed following labs and imaging studies  CBC:  Recent Labs Lab 04/24/16 1232 04/28/16 1613 04/29/16 0617  WBC 6.2 9.0 7.4  NEUTROABS 4.2 6.3  --   HGB 13.4 13.5 13.1  HCT 39.8 40.9 39.8  MCV 84.8 86.7 87.1  PLT 275.0 265 [id number]   Basic Metabolic Panel:  Recent Labs Lab 04/24/16 1232 04/28/16 1613 04/29/16 0617 04/30/16 0442  NA 139 138 135 138  K 4.2 4.5 3.8 4.1  CL 101 103 103 106  CO2 35* 27 23 26   GLUCOSE 107* 123* 89  102*  BUN 8 9 7 6   CREATININE 0.71 0.81 0.77 0.71  CALCIUM 8.9 8.9 8.3* 8.4*  MG  --  2.0  --  1.9  PHOS  --  3.6  --   --    GFR: Estimated Creatinine Clearance: 40.1 mL/min (by C-G formula based on SCr of 0.71 mg/dL). Liver Function Tests:  Recent Labs Lab 04/24/16 1232 04/28/16 1613 04/29/16 0617  AST 13 17 16   ALT 5 8* 7*  ALKPHOS 52 44 44  BILITOT 0.6 1.1 1.3*  PROT 6.1 6.6 5.9*  ALBUMIN 3.7 3.5 3.2*   No results for input(s): LIPASE, AMYLASE in the last 168 hours. No results for input(s): AMMONIA in the last 168 hours. Coagulation Profile: No results for input(s): INR, PROTIME in the last 168 hours. Cardiac Enzymes:  Recent Labs Lab 04/29/16 1030 04/29/16 1623  TROPONINI <0.03 <0.03   BNP (last 3 results)  No results for input(s): PROBNP in the last 8760 hours. HbA1C: No results for input(s): HGBA1C in the last 72 hours. CBG: No results for input(s): GLUCAP in the last 168 hours. Lipid Profile: No results for input(s): CHOL, HDL, LDLCALC, TRIG, CHOLHDL, LDLDIRECT in the last 72 hours. Thyroid Function Tests:  Recent Labs  04/29/16 1030  TSH 3.251   Anemia Panel:  Recent Labs  04/30/16 0442  VITAMINB12 313   Urine analysis:    Component Value Date/Time   COLORURINE YELLOW 04/29/2016 [place] 04/29/2016 0521   LABSPEC >1.046 (H) 04/29/2016 0521   PHURINE 5.0 04/29/2016 0521   GLUCOSEU NEGATIVE 04/29/2016 0521   HGBUR MODERATE (A) 04/29/2016 0521   HGBUR negative 04/07/2008 1505   BILIRUBINUR NEGATIVE 04/29/2016 0521   BILIRUBINUR n 04/24/2016 1259   KETONESUR 20 (A) 04/29/2016 0521   PROTEINUR NEGATIVE 04/29/2016 0521   UROBILINOGEN 0.2 04/24/2016 1259   UROBILINOGEN 0.2 07/25/2014 2145   NITRITE NEGATIVE 04/29/2016 0521   LEUKOCYTESUR SMALL (A) 04/29/2016 0521   Sepsis Labs: @LABRCNTIP (procalcitonin:4,lacticidven:4)  ) Recent Results (from the past 240 hour(s))  Urine culture     Status: None   Collection Time:  04/24/16 12:32 PM  Result Value Ref Range Status   Organism ID, Bacteria NO GROWTH  Final  Urine culture     Status: Abnormal   Collection Time: 04/29/16  5:21 AM  Result Value Ref Range Status   Specimen Description URINE, RANDOM  Final   Special Requests NONE  Final   Culture MULTIPLE SPECIES PRESENT, SUGGEST RECOLLECTION (A)  Final   Report Status 04/30/2016 FINAL  Final      Radiology Studies: Ct Abdomen Pelvis W Contrast  Result Date: 04/28/2016 CLINICAL DATA:  Left lower quadrant abdominal pain and emesis EXAM: CT ABDOMEN AND PELVIS WITH CONTRAST TECHNIQUE: Multidetector CT imaging of the abdomen and pelvis was performed using the standard protocol following bolus administration of intravenous contrast. CONTRAST:  100 ml ISOVUE-300 IOPAMIDOL (ISOVUE-300) INJECTION 61% COMPARISON:  CT abdomen pelvis 04/15/2016 FINDINGS: Lower chest: Bibasilar subsegmental atelectasis. Hepatobiliary: Normal hepatic size and contours without focal liver lesion. No perihepatic ascites. No intra- or extrahepatic biliary dilatation. Cholelithiasis without acute cholecystitis. Pancreas: Normal pancreatic contours and enhancement. No peripancreatic fluid collection or pancreatic ductal dilatation. Spleen: Normal. Adrenals/Urinary Tract: Normal adrenal glands. Left renal sinus cysts. No hydronephrosis or solid renal mass. The bilateral calcifications at the renal hila are suspected to be vascular. Stomach/Bowel: There is extensive descending colonic and rectosigmoid diverticulosis. There is focal colonic wall thickening and and surrounding inflammatory stranding at the distal descending colon. No evidence of perforation. No fluid collection or abscess. There is no dilated small bowel. The appendix is not clearly identified. Vascular/Lymphatic: There is atherosclerotic calcification of the non aneurysmal abdominal aorta. No abdominal or pelvic adenopathy. Reproductive: No adnexal mass. Musculoskeletal: Multilevel lumbar  facet arthrosis. Normal visualized extrathoracic and extraperitoneal soft tissues. Other: No contributory non-categorized findings. IMPRESSION: 1. Acute diverticulitis of the distal descending colon without abscess formation or evidence of perforation. 2. Cholelithiasis without acute inflammation. 3. Aortic atherosclerosis. Electronically Signed   By: Ulyses Jarred M.D.   On: 04/28/2016 20:14     Scheduled Meds: . apixaban  10 mg Oral BID   Followed by  . [START ON 05/05/2016] apixaban  5 mg Oral BID  . aspirin EC  81 mg Oral QHS  . donepezil  10 mg Oral QHS  . feeding supplement (ENSURE ENLIVE)  237 mL Oral BID  BM  . meropenem (MERREM) IV  1 g Intravenous Q12H  . metoCLOPramide (REGLAN) injection  5 mg Intravenous Q8H  . mometasone-formoterol  2 puff Inhalation BID   Continuous Infusions: . sodium chloride 75 mL/hr at 04/30/16 0420     LOS: 1 day   Time Spent in minutes   30 minutes  Tinika Bucknam D.O. on 04/30/2016 at 1:39 PM  Between 7am to 7pm - Pager - [phone]  After 7pm go to [email or web address] - password [email or web address]  And look for the night coverage person covering for me after hours  [facility] Office  [phone]

## 2016-04-30 NOTE — Progress Notes (Signed)
SATURATION QUALIFICATIONS: (This note is used to comply with regulatory documentation for home oxygen)  Patient Saturations on Room Air while Ambulating = 69%  Patient Saturations on Room Air while seated after ambulation= 92%  Please briefly explain why patient needs home oxygen: Pt desats during ambulation. Pt would benefit from trial of O2 during ambulation to further assess home O2 needs.   Tonia Brooms, SPT [phone]

## 2016-05-01 ENCOUNTER — Ambulatory Visit: Payer: Medicare Other | Admitting: Family Medicine

## 2016-05-01 NOTE — Progress Notes (Signed)
PROGRESS NOTE    [name] [name]  [id number] DOB: 1923-09-07 DOA: 04/28/2016 PCP: Garret Reddish, MD   No chief complaint on file.    Brief Narrative:  82 year old female with a history of dementia, recent PE (04/25/16), atrial fibrillation, pulmonary fibrosis, hypertension, breast cancer, IBS presented with worsening abdominal pain and generalized weakness over the past 3 days. The patient lives in an ILF to which she was  recently discharged from the hospital after a stay from 04/13/2016 through 04/14/2016 after treatment for diverticulitis and UTI. The patient was changed to cefpodoxime and metronidazole at the time of discharge.in addition, the patient also had a recent admission from 02/22/2016 through 02/26/2012 when she was treated for urinary tract infection and syncope. She was discharged on oral cephalexin. Her syncope was thought to be due to orthostatic hypotension. She had an emergency department visit on 02/28/2016 for abdominal pain. CT of the abdomen and pelvis at that time (02/28/16)  was negative for any acute findings, and the patient was discharged back to her ILF after receiving intravenous fluids.  After her December admission, the patient also had an additional ED visit on 04/15/2016 for chest pain and abdominal pain. Repeat CT of the abdomen and pelvis at that time showed improvement of her diverticulitis. The patient was discharged to continue her antibiotics. She was felt to be low risk for ACS. She was discharged back to her ILF. She saw her primary care physician on 04/24/2016 for continued chest pain. CT aangio of the chest was performed on 04/25/2016 which was positive for RML PE.  She was started on rivaroxaban.  Upon presentation, the patient was noted to be confused. The babysitter was 9.0, and the patient was afebrile hemodynamically stable. CT of the abdomen and pelvis on 04/28/2016 revealed acute diverticulitis of the distal descending colon. The patient was started on  meropenem. Assessment & Plan   Acute diverticulitis -Previously finished 7 day course Cefpodoxime and metronidazole on 04/21/2016 -Started meropenem -04/28/2016 CT abdomen and pelvis--acute diverticulitis distal descending colon without abscess or perforation -Tolerated soft diet.  -Will discontinue IVF -may need longer course abx after d/c -02/26/11 flex sig--proctitis and severe diverticulosis (Dr. Olevia Perches)  Acute metabolic encephalopathy -Secondary to infectious process -Suspect continue to functional and cognitive decline secondary to numerous acute medical conditions in the setting of established dementia -Continue Aricept  Pulmonary embolus -04/25/2016 CTA chest--RML PE -Rivaroxaban switched to apixaban -Echocardiogram EF 60-65%, G1DD  Chest pain -reproducible on palpation of R upper chest wall or deep breathing -cycle troponins -echocardiogram as above  Pyuria -continue merrem -Urine culture showed multiple species  Generalized weakness/failure to thrive -Progressive since October 2017 -Secondary to multiple acute and chronic medical conditions -PT/OT- PT rec SNF/HHPT, supervision  -TSH 3.251 -B12 313  COPD/pulm fibrosis: at baseline.  -No O2 at ILF -continue dulera and PRN albuterol  Goals Of Care -palliative care consulted and appreciated  DVT Prophylaxis  Eliquis  Code Status: Full  Family Communication: None at bedside  Disposition Plan: Admitted. Possibly SNF at discharge  Consultants Palliative care  Procedures  None  Antibiotics   Anti-infectives    Start     Dose/Rate Route Frequency Ordered Stop   04/29/16 0800  metroNIDAZOLE (FLAGYL) IVPB 500 mg  Status:  Discontinued     500 mg 100 mL/hr over 60 Minutes Intravenous Every 8 hours 04/29/16 0008 04/29/16 0036   04/29/16 0045  metroNIDAZOLE (FLAGYL) IVPB 500 mg  Status:  Discontinued     500 mg 100 mL/hr  over 60 Minutes Intravenous  Once 04/29/16 0030 04/29/16 0036   04/29/16  0045  meropenem (MERREM) 1 g in sodium chloride 0.9 % 100 mL IVPB     1 g 200 mL/hr over 30 Minutes Intravenous Every 12 hours 04/29/16 0043        Subjective:   [name] [name] seen and examined today.  Continues to have abdominal pain, but feels it is improving. Able to eat.  Continues to have loose BMs. Denies nausea, vomiting, chest pain, shortness of breath, headache, dizziness.  Objective:   Vitals:   04/30/16 1313 04/30/16 2020 04/30/16 2110 05/01/16 0600  BP: 140/64  (!) 143/63 130/84  Pulse: 60  70 75  Resp: 16  18 18   Temp: 98 F (36.7 C)  97.7 F (36.5 C) 98 F (36.7 C)  TempSrc: Oral  Oral Oral  SpO2: 97% 98% 93% 97%  Weight:      Height:        Intake/Output Summary (Last 24 hours) at 05/01/16 1415 Last data filed at 05/01/16 1319  Gross per 24 hour  Intake          2528.75 ml  Output             2550 ml  Net           -21.25 ml   Filed Weights   04/28/16 1208  Weight: 60.6 kg (133 lb 9.6 oz)    Exam  General: Well developed, well nourished, NAD, appears stated age  HEENT: NCAT,  mucous membranes moist.   Cardiovascular: S1 S2 auscultated, RRR  Respiratory: Clear to auscultation bilaterally with equal chest rise  Abdomen: Soft, TTP LUQ and LLQ, nondistended, + bowel sounds  Extremities: warm dry without cyanosis clubbing or edema  Neuro: AAOx3, nonfocal (hard of hearing)  Psych: Normal affect and demeanor, pleasant   Data Reviewed: I have personally reviewed following labs and imaging studies  CBC:  Recent Labs Lab 04/28/16 1613 04/29/16 0617  WBC 9.0 7.4  NEUTROABS 6.3  --   HGB 13.5 13.1  HCT 40.9 39.8  MCV 86.7 87.1  PLT 265 [id number]   Basic Metabolic Panel:  Recent Labs Lab 04/28/16 1613 04/29/16 0617 04/30/16 0442  NA 138 135 138  K 4.5 3.8 4.1  CL 103 103 106  CO2 27 23 26   GLUCOSE 123* 89 102*  BUN 9 7 6   CREATININE 0.81 0.77 0.71  CALCIUM 8.9 8.3* 8.4*  MG 2.0  --  1.9  PHOS 3.6  --   --    GFR: Estimated  Creatinine Clearance: 40.1 mL/min (by C-G formula based on SCr of 0.71 mg/dL). Liver Function Tests:  Recent Labs Lab 04/28/16 1613 04/29/16 0617  AST 17 16  ALT 8* 7*  ALKPHOS 44 44  BILITOT 1.1 1.3*  PROT 6.6 5.9*  ALBUMIN 3.5 3.2*   No results for input(s): LIPASE, AMYLASE in the last 168 hours. No results for input(s): AMMONIA in the last 168 hours. Coagulation Profile: No results for input(s): INR, PROTIME in the last 168 hours. Cardiac Enzymes:  Recent Labs Lab 04/29/16 1030 04/29/16 1623  TROPONINI <0.03 <0.03   BNP (last 3 results) No results for input(s): PROBNP in the last 8760 hours. HbA1C: No results for input(s): HGBA1C in the last 72 hours. CBG: No results for input(s): GLUCAP in the last 168 hours. Lipid Profile: No results for input(s): CHOL, HDL, LDLCALC, TRIG, CHOLHDL, LDLDIRECT in the last 72 hours. Thyroid Function Tests:  Recent  Labs  04/29/16 1030  TSH 3.251   Anemia Panel:  Recent Labs  04/30/16 0442  VITAMINB12 313   Urine analysis:    Component Value Date/Time   COLORURINE YELLOW 04/29/2016 0521   APPEARANCEUR CLEAR 04/29/2016 0521   LABSPEC >1.046 (H) 04/29/2016 0521   PHURINE 5.0 04/29/2016 0521   GLUCOSEU NEGATIVE 04/29/2016 0521   HGBUR MODERATE (A) 04/29/2016 0521   HGBUR negative 04/07/2008 1505   BILIRUBINUR NEGATIVE 04/29/2016 0521   BILIRUBINUR n 04/24/2016 1259   KETONESUR 20 (A) 04/29/2016 0521   PROTEINUR NEGATIVE 04/29/2016 0521   UROBILINOGEN 0.2 04/24/2016 1259   UROBILINOGEN 0.2 07/25/2014 2145   NITRITE NEGATIVE 04/29/2016 0521   LEUKOCYTESUR SMALL (A) 04/29/2016 0521   Sepsis Labs: @LABRCNTIP (procalcitonin:4,lacticidven:4)  ) Recent Results (from the past 240 hour(s))  Urine culture     Status: None   Collection Time: 04/24/16 12:32 PM  Result Value Ref Range Status   Organism ID, Bacteria NO GROWTH  Final  Urine culture     Status: Abnormal   Collection Time: 04/29/16  5:21 AM  Result Value Ref  Range Status   Specimen Description URINE, RANDOM  Final   Special Requests NONE  Final   Culture MULTIPLE SPECIES PRESENT, SUGGEST RECOLLECTION (A)  Final   Report Status 04/30/2016 FINAL  Final      Radiology Studies: No results found.   Scheduled Meds: . apixaban  10 mg Oral BID   Followed by  . [START ON 05/05/2016] apixaban  5 mg Oral BID  . aspirin EC  81 mg Oral QHS  . donepezil  10 mg Oral QHS  . feeding supplement (ENSURE ENLIVE)  237 mL Oral BID BM  . meropenem (MERREM) IV  1 g Intravenous Q12H  . metoCLOPramide (REGLAN) injection  5 mg Intravenous Q8H  . mometasone-formoterol  2 puff Inhalation BID   Continuous Infusions: . sodium chloride 75 mL/hr at 05/01/16 1002     LOS: 2 days   Time Spent in minutes   30 minutes  Carollee Nussbaumer D.O. on 05/01/2016 at 2:15 PM  Between 7am to 7pm - Pager - [phone]  After 7pm go to [email or web address] - password [email or web address]  And look for the night coverage person covering for me after hours  [facility] Office  [phone]

## 2016-05-01 NOTE — Progress Notes (Signed)
Nutrition Follow-up  DOCUMENTATION CODES:   Not applicable  INTERVENTION:   -Continue Ensure Enlive po BID, each supplement provides 350 kcal and 20 grams of protein  NUTRITION DIAGNOSIS:   Inadequate oral intake related to poor appetite as evidenced by meal completion < 25%.  Progressing  GOAL:   Patient will meet greater than or equal to 90% of their needs  Progressing  MONITOR:   PO intake, Supplement acceptance, Diet advancement, Labs, Weight trends, Skin, I & O's  REASON FOR ASSESSMENT:   Consult Assessment of nutrition requirement/status  ASSESSMENT:   [name] [name] is a 82 y.o. female with medical history significant of 5 mm stenotic airway, anxiety, A. fib, CAD, COPD, diverticulitis, GERD, hiatal hernia, hypertension, IBS, breast cancer, osteoporosis, pulmonary fibrosis, Raynaud's, presenting w/ several week history of multiple medical problems w/ new symptoms over past 3 days.   Pt sleeping soundly at time of visit.   Noted Palliative Care consult pending; family is requesting SNF placement for rehab with palliative service follow-up.   Intake has improved since earlier this week. Noted meal completion 25-50%. Bottle of Ensure at bedside; pt consumed about 25%. Per MAR, pt has been accepting supplements. Will continue supplements for increased nutrient provision.   Labs reviewed.   Diet Order:  DIET SOFT Room service appropriate? Yes; Fluid consistency: Thin  Skin:  Reviewed, no issues  Last BM:  04/29/16  Height:   Ht Readings from Last 1 Encounters:  04/28/16 5\' 2"  (1.575 m)    Weight:   Wt Readings from Last 1 Encounters:  04/28/16 133 lb 9.6 oz (60.6 kg)    Ideal Body Weight:  50 kg  BMI:  Body mass index is 24.44 kg/m.  Estimated Nutritional Needs:   Kcal:  1300-1500  Protein:  65-80 grams  Fluid:  >1.3 L  EDUCATION NEEDS:   No education needs identified at this time  Gwenetta Devos A. Jimmye Norman, RD, LDN, CDE Pager: [phone] After  hours Pager: [phone]

## 2016-05-01 NOTE — Progress Notes (Signed)
ANTICOAGULATION & ANTIBIOTIC CONSULT NOTE - Follow-Up  Pharmacy Consult for Apixaban & Meropenem Indication: New PE (diagnosed 04/25/16) & Acute diverticulitis  Allergies  Allergen Reactions  . Azithromycin Hives, Diarrhea, Dermatitis and Rash  . Ciprofloxacin Swelling  . Levaquin [Levofloxacin] Hives  . Penicillins Hives    has had mild doses that she did not have reactions to  Has patient had a PCN reaction causing immediate rash, facial/tongue/throat swelling, SOB or lightheadedness with hypotension:Yes Has patient had a PCN reaction causing severe rash involving mucus membranes or skin necrosis: No Has patient had a PCN reaction that required hospitalization: no Has patient had a PCN reaction occurring within the last 10 years: no If all of the above answers are "NO", then may proceed with Cephalosporin use.   . Sulfamethoxazole Nausea And Vomiting    Patient Measurements: Height: 5\' 2"  (157.5 cm) Weight: 133 lb 9.6 oz (60.6 kg) IBW/kg (Calculated) : 50.1  Vital Signs: Temp: 98 F (36.7 C) (12/21 0600) Temp Source: Oral (12/21 0600) BP: 130/84 (12/21 0600) Pulse Rate: 75 (12/21 0600)  Labs:  Recent Labs  04/28/16 1613 04/29/16 0617 04/29/16 1030 04/29/16 1623 04/30/16 0442  HGB 13.5 13.1  --   --   --   HCT 40.9 39.8  --   --   --   PLT 265 234  --   --   --   CREATININE 0.81 0.77  --   --  0.71  TROPONINI  --   --  <0.03 <0.03  --     Estimated Creatinine Clearance: 40.1 mL/min (by C-G formula based on SCr of 0.71 mg/dL).  Assessment: 60 YOF recently diagnosed with a new PE on 12/15 and started on Xarelto who presented on 12/19 with generalized weakness and abdominal pain. The patient was transitioned from [place] to Apixaban on admission given it's better safety profile with elderly - pharmacy aided in this transition and is also consulted to dose Meropenem in the setting of acute diverticulitis.   CBC from 12/19 wnl. SCr 0.71 on 12/20, estimated CrCl~40  ml/min. Apixaban and Meropenem dosing remain appropriate.  Goal of Therapy:  Appropriate anticoagulation for indication and hepatic/renal function Proper antibiotics for infection/cultures adjusted for renal/hepatic function   Plan:  1. Continue Apixaban 10 mg twice daily thru 12/24, then transition to Apixaban 5 mg twice daily starting on 12/25 2. Education completed with patient and daughter on 12/20 3. Continue Meropenem 1g IV every 12 hours 4. Will continue to monitor for any signs/symptoms of bleeding 5. Will continue to follow renal function, culture results, LOT, and antibiotic de-escalation plans   Thank you for allowing pharmacy to be a part of this patient's care.  Alycia Rossetti, PharmD, BCPS Clinical Pharmacist Pager: [phone] Clinical phone for 05/01/2016 from 7a-3:30p: [phone] If after 3:30p, please call main pharmacy at: [phone] 05/01/2016 10:58 AM

## 2016-05-01 NOTE — Consult Note (Signed)
   [facility] CM Inpatient Consult   05/01/2016  [name] [name] [birth year] [id number]    Patient screened for potential [facility] Care Management services. Chart reviewed. Noted current discharge plan is for SNF. Also noted paliltiave medicine notes.  There are no identifiable [facility] Care Management needs at this time. If patient's post hospital needs change, please place a [facility] Care Management consult. For questions please contact:  Marthenia Rolling, [place], RN,BSN [facility] Liaison [phone]

## 2016-05-02 DIAGNOSIS — N390 Urinary tract infection, site not specified: Secondary | ICD-10-CM | POA: Diagnosis not present

## 2016-05-02 DIAGNOSIS — F0280 Dementia in other diseases classified elsewhere without behavioral disturbance: Secondary | ICD-10-CM | POA: Diagnosis not present

## 2016-05-02 DIAGNOSIS — R531 Weakness: Secondary | ICD-10-CM | POA: Diagnosis not present

## 2016-05-02 DIAGNOSIS — J309 Allergic rhinitis, unspecified: Secondary | ICD-10-CM | POA: Diagnosis not present

## 2016-05-02 DIAGNOSIS — K219 Gastro-esophageal reflux disease without esophagitis: Secondary | ICD-10-CM | POA: Diagnosis not present

## 2016-05-02 DIAGNOSIS — M6281 Muscle weakness (generalized): Secondary | ICD-10-CM | POA: Diagnosis not present

## 2016-05-02 DIAGNOSIS — F0390 Unspecified dementia without behavioral disturbance: Secondary | ICD-10-CM | POA: Diagnosis not present

## 2016-05-02 DIAGNOSIS — F411 Generalized anxiety disorder: Secondary | ICD-10-CM | POA: Diagnosis not present

## 2016-05-02 DIAGNOSIS — I482 Chronic atrial fibrillation: Secondary | ICD-10-CM | POA: Diagnosis not present

## 2016-05-02 DIAGNOSIS — G4733 Obstructive sleep apnea (adult) (pediatric): Secondary | ICD-10-CM | POA: Diagnosis not present

## 2016-05-02 DIAGNOSIS — R0602 Shortness of breath: Secondary | ICD-10-CM | POA: Diagnosis not present

## 2016-05-02 DIAGNOSIS — J438 Other emphysema: Secondary | ICD-10-CM | POA: Diagnosis not present

## 2016-05-02 DIAGNOSIS — G301 Alzheimer's disease with late onset: Secondary | ICD-10-CM | POA: Diagnosis not present

## 2016-05-02 DIAGNOSIS — G9340 Encephalopathy, unspecified: Secondary | ICD-10-CM | POA: Diagnosis not present

## 2016-05-02 DIAGNOSIS — F419 Anxiety disorder, unspecified: Secondary | ICD-10-CM | POA: Diagnosis not present

## 2016-05-02 DIAGNOSIS — K5792 Diverticulitis of intestine, part unspecified, without perforation or abscess without bleeding: Secondary | ICD-10-CM | POA: Diagnosis not present

## 2016-05-02 DIAGNOSIS — I10 Essential (primary) hypertension: Secondary | ICD-10-CM | POA: Diagnosis not present

## 2016-05-02 DIAGNOSIS — I2699 Other pulmonary embolism without acute cor pulmonale: Secondary | ICD-10-CM | POA: Diagnosis not present

## 2016-05-02 DIAGNOSIS — Z8673 Personal history of transient ischemic attack (TIA), and cerebral infarction without residual deficits: Secondary | ICD-10-CM | POA: Diagnosis not present

## 2016-05-02 LAB — BASIC METABOLIC PANEL
ANION GAP: 10 (ref 5–15)
BUN: <5 mg/dL — ABNORMAL LOW (ref 6–20)
CALCIUM: 8.7 mg/dL — AB (ref 8.9–10.3)
CO2: 24 mmol/L (ref 22–32)
CREATININE: 0.59 mg/dL (ref 0.44–1.00)
Chloride: 106 mmol/L (ref 101–111)
Glucose, Bld: 99 mg/dL (ref 65–99)
Potassium: 3.6 mmol/L (ref 3.5–5.1)
SODIUM: 140 mmol/L (ref 135–145)

## 2016-05-02 LAB — CBC
HEMATOCRIT: 37.2 % (ref 36.0–46.0)
Hemoglobin: 12.3 g/dL (ref 12.0–15.0)
MCH: 28.1 pg (ref 26.0–34.0)
MCHC: 33.1 g/dL (ref 30.0–36.0)
MCV: 85.1 fL (ref 78.0–100.0)
PLATELETS: 225 10*3/uL (ref 150–400)
RBC: 4.37 MIL/uL (ref 3.87–5.11)
RDW: 14.6 % (ref 11.5–15.5)
WBC: 6 10*3/uL (ref 4.0–10.5)

## 2016-05-02 MED ORDER — APIXABAN 5 MG PO TABS: 5.0000 mg | ORAL_TABLET | Freq: Two times a day (BID) | ORAL | 0 refills | Status: DC

## 2016-05-02 MED ORDER — ACETAMINOPHEN 325 MG PO TABS: 650.0000 mg | ORAL_TABLET | Freq: Four times a day (QID) | ORAL | Status: AC | PRN

## 2016-05-02 MED ORDER — DIAZEPAM 5 MG PO TABS: 5.0000 mg | ORAL_TABLET | Freq: Every day | ORAL | 0 refills | Status: DC

## 2016-05-02 MED ORDER — METRONIDAZOLE 250 MG PO TABS: 250.0000 mg | ORAL_TABLET | Freq: Two times a day (BID) | ORAL | 0 refills | Status: DC

## 2016-05-02 MED ORDER — APIXABAN 5 MG PO TABS: 10.0000 mg | ORAL_TABLET | Freq: Two times a day (BID) | ORAL | Status: DC

## 2016-05-02 MED ORDER — ENSURE ENLIVE PO LIQD: 237.0000 mL | Freq: Two times a day (BID) | ORAL | 12 refills | Status: DC

## 2016-05-02 MED ORDER — CEFPODOXIME PROXETIL 200 MG PO TABS: 200.0000 mg | ORAL_TABLET | Freq: Two times a day (BID) | ORAL | 0 refills | Status: DC

## 2016-05-02 NOTE — Progress Notes (Signed)
Patient is medically stable for discharge today to SNF. Bed chosen: [place] Patient can transfer after 2pm by EMS. Daughter involved in care and completing paperwork for admission and aware of DC. Agreeable to plan.  LCSW has sent all paperwork to facility.  No other needs. DC this afternoon.  Lane Hacker, MSW Clinical Social Work: [employer] Coverage for :  [phone]

## 2016-05-02 NOTE — Progress Notes (Signed)
PT Cancellation Note  Patient Details Name: [name] [name] MRN: [id number] DOB: [birth year]   Cancelled Treatment:     Attempted to see. Pt d/c to SNF this afternoon. All further PT needs can be deferred to next venue of care. PT will sign off acutely.    Tonia Brooms 05/02/2016, 3:04 PM  Tonia Brooms, SPT [phone]

## 2016-05-02 NOTE — Discharge Summary (Signed)
Physician Discharge Summary  [name] [name] [id number] DOB: 1923/06/06 DOA: 04/28/2016  PCP: Garret Reddish, MD  Admit date: 04/28/2016 Discharge date: 05/02/2016  Time spent: 45 minutes  Recommendations for Outpatient Follow-up:  Patient will be discharged to [facility] facility. Continue physical and occupational therapy.  Patient will need to follow up with primary care provider within one week of discharge.  Patient should continue medications as prescribed.  Patient should follow a soft diet.   Discharge Diagnoses:  Acute diverticulitis Acute metabolic encephalopathy Pulmonary embolus Chest pain Pyuria Generalized weakness/failure to thrive COPD/pulm fibrosis Goals Of Care  Discharge Condition: Stable  Diet recommendation: soft  Filed Weights   04/28/16 1208  Weight: 60.6 kg (133 lb 9.6 oz)    History of present illness:  On 04/28/2016 by Dr. Linna Darner [name] [name] is a 82 y.o. female with medical history significant of 5 mm stenotic airway, anxiety, A. fib, CAD, COPD, diverticulitis, GERD, hiatal hernia, hypertension, IBS, breast cancer, osteoporosis, pulmonary fibrosis, Raynaud's, presenting w/ several week history of multiple medical problems w/ new symptoms over past 3 days. Level V caveat applies as patient is unable to provide reliable history at this time due to baseline dementia and acute confusional state. History provided predominantly by patient's daughter who is one of her primary caretakers. Of note prior to onset of multiple medical complications over the last several weeks patient was doing well in an independent living facility. Patient able to perform her ADLs prior to that time. Of note patient diagnosed and admitted for treatment of a UTI in October 2017. Patient was again seen on 04/13/2016 in the emergency room and started on treatment for diverticulitis. Urine cultures at that time were negative. Patient seen 2 days later on 04/15/2016 [facility]  long emergency room for persistent abdominal discomfort and new onset right upper chest wall pain. Workup at that time showed improvement in her diverticulitis. Patient completed antibiotic course on 04/21/2016. Patient continued to stay have chest pain which is unchanged from onset on 08/15/2015. Worse with deep breathing or certain movements. Patient's daughter reports significant physical mental decline starting on 04/25/2016. No additional focal complaints outside of persistent abdominal discomfort, intermittent diarrhea, and chest pain as outlined above. Patient's current confusional state is very unusual for her per patient started. Her baseline dementia typically keeps her intermittently mildly confused or forgetful. Patient forgetting to take her medications. Abdominal pain is described as diffuse with localization to the left upper quadrant. Sometimes worse with food. Nothing makes her symptoms better. Single episode of emesis after ensure.  Of note patient diagnosed on 04/25/2016 with a ulnar embolus confirmed on CTA. Started on Xarelto since that time. Denies any malonic stool. Denies any recent fevers, dysuria, frequency, flank pain, rash, neck stiffness, headache, vertigo, palpitations, LOC.  Hospital Course:  Acute diverticulitis -Previously finished 7 day course Cefpodoxime and metronidazole on 04/21/2016 -Started meropenem, will discharge patient with Flagyl and Vantin (possibly need longer course- spoke with pharmacy, recommended Flagyl 250mg  BID, Vantin 200mg  BID x 10 days) -04/28/2016 CT abdomen and pelvis--acute diverticulitis distal descending colon without abscess or perforation -Tolerated soft diet.  -02/26/11 flex sig--proctitis and severe diverticulosis (Dr. Olevia Perches)  Acute metabolic encephalopathy -Secondary to infectious process -Suspect continue to functional and cognitive decline secondary to numerous acute medical conditions in the setting of established  dementia -Continue Aricept  Pulmonary embolus -04/25/2016 CTA chest--RML PE -Rivaroxaban switched to apixaban -Echocardiogram EF 60-65%, G1DD  Chest pain -reproducible on palpation of R upper chest wall  or deep breathing -cycle troponins -echocardiogram as above  Pyuria -continue merrem -Urine culture showed multiple species  Generalized weakness/failure to thrive -Progressive since October 2017 -Secondary to multiple acute and chronic medical conditions -PT/OT- PT rec SNF/HHPT, supervision  -TSH 3.251 -B12 313  COPD/pulm fibrosis: at baseline.  -No O2 at ILF -continue dulera and PRN albuterol  Goals Of Care -palliative care consulted and appreciated  Consultants Palliative care  Procedures  None  Discharge Exam: Vitals:   05/01/16 1516 05/01/16 2124  BP: 139/66 (!) 165/75  Pulse: 81 67  Resp: 18 16  Temp: 98.2 F (36.8 C) 98.3 F (36.8 C)   Feels better today. Still has some abdominal pain, but improving. Able to tolerate her diet. Denies nausea, vomiting, chest pain, shortness of breath, headache, dizziness.  Exam  General: Well developed, well nourished, NAD, appears stated age  64: NCAT,  mucous membranes moist.   Cardiovascular: S1 S2 auscultated, RRR  Respiratory: Clear to auscultation bilaterally with equal chest rise  Abdomen: Soft, TTP LUQ and LLQ, nondistended, + bowel sounds  Extremities: warm dry without cyanosis clubbing or edema  Neuro: AAOx3, nonfocal (hard of hearing)  Psych: Normal affect and demeanor, pleasant  Discharge Instructions Discharge Instructions    Discharge instructions    Complete by:  As directed    Patient will be discharged to [place] facility. Continue physical and occupational therapy.  Patient will need to follow up with primary care provider within one week of discharge.  Patient should continue medications as prescribed.  Patient should follow a soft diet.     Current Discharge  Medication List    START taking these medications   Details  acetaminophen (TYLENOL) 325 MG tablet Take 2 tablets (650 mg total) by mouth every 6 (six) hours as needed for mild pain (or Fever >/= 101).    !! apixaban (ELIQUIS) 5 MG TABS tablet Take 2 tablets (10 mg total) by mouth 2 (two) times daily. Continue through 05/04/2016. Qty: 60 tablet    !! apixaban (ELIQUIS) 5 MG TABS tablet Take 1 tablet (5 mg total) by mouth 2 (two) times daily. Start on 05/05/2016 Qty: 60 tablet, Refills: 0    cefpodoxime (VANTIN) 200 MG tablet Take 1 tablet (200 mg total) by mouth 2 (two) times daily. Qty: 20 tablet, Refills: 0    feeding supplement, ENSURE ENLIVE, (ENSURE ENLIVE) LIQD Take 237 mLs by mouth 2 (two) times daily between meals. Qty: 237 mL, Refills: 12    metroNIDAZOLE (FLAGYL) 250 MG tablet Take 1 tablet (250 mg total) by mouth 2 (two) times daily. Qty: 20 tablet, Refills: 0     !! - Potential duplicate medications found. Please discuss with provider.    CONTINUE these medications which have CHANGED   Details  diazepam (VALIUM) 5 MG tablet Take 1 tablet (5 mg total) by mouth at bedtime. Qty: 10 tablet, Refills: 0      CONTINUE these medications which have NOT CHANGED   Details  albuterol (PROVENTIL HFA;VENTOLIN HFA) 108 (90 Base) MCG/ACT inhaler Inhale 2 puffs into the lungs every 6 (six) hours as needed for wheezing or shortness of breath. Qty: 1 Inhaler, Refills: 0   Associated Diagnoses: Cough    budesonide-formoterol (SYMBICORT) 160-4.5 MCG/ACT inhaler Inhale 2 puffs into the lungs 2 (two) times daily. Qty: 1 Inhaler, Refills: 12    donepezil (ARICEPT) 10 MG tablet Take 1/2 tablet daily for 1 month, then increase to 1 tablet daily Qty: 30 tablet, Refills: 11  Associated Diagnoses: Mild dementia    fluticasone (FLONASE) 50 MCG/ACT nasal spray Place 2 sprays into both nostrils daily.    nitroGLYCERIN (NITROSTAT) 0.4 MG SL tablet Place 1 tablet (0.4 mg total) under the  tongue every 5 (five) minutes as needed for chest pain (take as directed). Qty: 25 tablet, Refills: 3    ondansetron (ZOFRAN) 4 MG tablet Take 1 tablet (4 mg total) by mouth every 8 (eight) hours as needed for nausea or vomiting. Qty: 20 tablet, Refills: 0   Associated Diagnoses: Nausea without vomiting    aspirin 325 MG tablet Take 325 mg by mouth at bedtime.       STOP taking these medications     Rivaroxaban (XARELTO) 15 MG TABS tablet        Allergies  Allergen Reactions  . Azithromycin Hives, Diarrhea, Dermatitis and Rash  . Ciprofloxacin Swelling  . Levaquin [Levofloxacin] Hives  . Penicillins Hives    has had mild doses that she did not have reactions to  Has patient had a PCN reaction causing immediate rash, facial/tongue/throat swelling, SOB or lightheadedness with hypotension:Yes Has patient had a PCN reaction causing severe rash involving mucus membranes or skin necrosis: No Has patient had a PCN reaction that required hospitalization: no Has patient had a PCN reaction occurring within the last 10 years: no If all of the above answers are "NO", then may proceed with Cephalosporin use.   . Sulfamethoxazole Nausea And Vomiting    Contact information for follow-up providers    Garret Reddish, MD. Schedule an appointment as soon as possible for a visit in 1 week(s).   Specialty:  Family Medicine Why:  Hospital follow up Contact information: [place] [address] [phone]            Contact information for after-discharge care    Destination    [facility] Follow up.   Specialty:  Skilled Nursing Facility Contact information: [address] [place] [phone]                   The results of significant diagnostics from this hospitalization (including imaging, microbiology, ancillary and laboratory) are listed below for reference.    Significant Diagnostic Studies: Dg Chest 2  View  Result Date: 04/24/2016 CLINICAL DATA:  Right anterior chest pain EXAM: CHEST  2 VIEW COMPARISON:  04/15/16 FINDINGS: Cardiomediastinal stress that cardiomegaly again noted. Hyperinflation again noted. There is chronic pleural thickening and scarring in left lower hemithorax with blunting of the costophrenic angle. Stable linear scarring left perihilar. Old left rib fracture. No definite superimposed infiltrate or pulmonary edema. Atherosclerotic calcifications of thoracic aorta. Thoracic spine osteopenia. IMPRESSION: Hyperinflation again noted. There is chronic pleural thickening and scarring in left lower hemithorax with blunting of the costophrenic angle. Stable linear scarring left perihilar. Old left rib fracture. No definite superimposed infiltrate or pulmonary edema. Electronically Signed   By: Lahoma Crocker M.D.   On: 04/24/2016 14:01   Dg Chest 2 View  Result Date: 04/15/2016 CLINICAL DATA:  Intermittent right-sided chest pain. Abdominal infection. EXAM: CHEST  2 VIEW COMPARISON:  02/28/2016.  11/23/2015.  07/25/2014. FINDINGS: There is chronic left ventricular enlargement. There is aortic atherosclerosis. There is chronic pleural and parenchymal scarring on the left, presumably secondary to old chest trauma. There is abnormal venous hypertension with interstitial edema and a small amount of fluid in the fissures. No acute bone finding. IMPRESSION: Chronic pleural and parenchymal scarring on the  left. Probable fluid overload/ mild congestive heart failure superimposed. Aortic atherosclerosis. Electronically Signed   By: Nelson Chimes M.D.   On: 04/15/2016 11:34   Ct Angio Chest W/cm &/or Wo Cm  Result Date: 04/25/2016 CLINICAL DATA:  Right chest pain, shortness of breath, and elevated D-dimer. EXAM: CT ANGIOGRAPHY CHEST WITH CONTRAST TECHNIQUE: Multidetector CT imaging of the chest was performed using the standard protocol during bolus administration of intravenous contrast. Multiplanar CT  image reconstructions and MIPs were obtained to evaluate the vascular anatomy. CONTRAST:  75 mL Isovue-300 COMPARISON:  Chest CTA 07/24/2013. FINDINGS: Cardiovascular: Pulmonary arterial opacification is adequate. There is a filling defect within a subsegmental pulmonary arterial branch in the right middle lobe (series 5, image 138). No segmental or more central filling defects are identified. There is thoracic aortic atherosclerosis without aneurysm. Coronary artery atherosclerosis is noted. There is no pericardial effusion. The heart is displaced leftward in the chest related to pectus excavatum deformity. Mediastinum/Nodes: Prior right mastectomy. Surgical clips in the right axilla. No enlarged axillary, mediastinal, or hilar lymph nodes are identified. Lungs/Pleura: No pleural effusion or pneumothorax. There is chronic scarring in the left upper lobe and to a lesser extent right middle lobe. There is mild left lower lobe atelectasis. Left lung calcified granuloma is unchanged. There is mild-to-moderate centrilobular emphysema. Upper Abdomen: Abdominal aortic atherosclerosis and colonic diverticulosis are partially visualized. Musculoskeletal: Pectus excavatum. Postthoracotomy changes to multiple left ribs. Review of the MIP images confirms the above findings. IMPRESSION: 1. Subsegmental pulmonary embolus in the right middle lobe. No central emboli. 2. Chronic lung scarring and centrilobular emphysema. 3. Aortic atherosclerosis. Critical Value/emergent results were called by telephone at the time of interpretation on 04/25/2016 at 4:17 pm to Dr. Garret Reddish , who verbally acknowledged these results. Electronically Signed   By: Logan Bores M.D.   On: 04/25/2016 16:21   Ct Abdomen Pelvis W Contrast  Result Date: 04/28/2016 CLINICAL DATA:  Left lower quadrant abdominal pain and emesis EXAM: CT ABDOMEN AND PELVIS WITH CONTRAST TECHNIQUE: Multidetector CT imaging of the abdomen and pelvis was performed using  the standard protocol following bolus administration of intravenous contrast. CONTRAST:  100 ml ISOVUE-300 IOPAMIDOL (ISOVUE-300) INJECTION 61% COMPARISON:  CT abdomen pelvis 04/15/2016 FINDINGS: Lower chest: Bibasilar subsegmental atelectasis. Hepatobiliary: Normal hepatic size and contours without focal liver lesion. No perihepatic ascites. No intra- or extrahepatic biliary dilatation. Cholelithiasis without acute cholecystitis. Pancreas: Normal pancreatic contours and enhancement. No peripancreatic fluid collection or pancreatic ductal dilatation. Spleen: Normal. Adrenals/Urinary Tract: Normal adrenal glands. Left renal sinus cysts. No hydronephrosis or solid renal mass. The bilateral calcifications at the renal hila are suspected to be vascular. Stomach/Bowel: There is extensive descending colonic and rectosigmoid diverticulosis. There is focal colonic wall thickening and and surrounding inflammatory stranding at the distal descending colon. No evidence of perforation. No fluid collection or abscess. There is no dilated small bowel. The appendix is not clearly identified. Vascular/Lymphatic: There is atherosclerotic calcification of the non aneurysmal abdominal aorta. No abdominal or pelvic adenopathy. Reproductive: No adnexal mass. Musculoskeletal: Multilevel lumbar facet arthrosis. Normal visualized extrathoracic and extraperitoneal soft tissues. Other: No contributory non-categorized findings. IMPRESSION: 1. Acute diverticulitis of the distal descending colon without abscess formation or evidence of perforation. 2. Cholelithiasis without acute inflammation. 3. Aortic atherosclerosis. Electronically Signed   By: Ulyses Jarred M.D.   On: 04/28/2016 20:14   Ct Abdomen Pelvis W Contrast  Result Date: 04/15/2016 CLINICAL DATA:  Generalized abdominal pain. EXAM: CT ABDOMEN AND PELVIS WITH  CONTRAST TECHNIQUE: Multidetector CT imaging of the abdomen and pelvis was performed using the standard protocol following  bolus administration of intravenous contrast. CONTRAST:  100 mL of Isovue-300 intravenously. COMPARISON:  CT scan of April 13, 2016. FINDINGS: Lower chest: Mild bibasilar subsegmental atelectasis is noted. Hepatobiliary: Small gallstone is noted.  Liver is unremarkable. Pancreas: Unremarkable. No pancreatic ductal dilatation or surrounding inflammatory changes. Spleen: Normal in size without focal abnormality. Adrenals/Urinary Tract: Adrenal glands appear normal. Left parapelvic cysts are noted. No hydronephrosis or renal obstruction is noted. Urinary bladder appears normal. No renal or ureteral calculi are noted. Stomach/Bowel: Diverticulosis of descending and sigmoid colon is noted. Inflammatory changes seen involving proximal descending colon are significantly improved compared to prior exam, with only minimal spur residual inflammatory stranding present. There is no evidence of bowel obstruction. Vascular/Lymphatic: Aortic atherosclerosis. No enlarged abdominal or pelvic lymph nodes. Reproductive: Status post hysterectomy. No adnexal masses. Other: No abdominal wall hernia or abnormality. No abdominopelvic ascites. Musculoskeletal: Moderate degenerative disc disease is noted at L4-5. IMPRESSION: Small gallstone is noted. Aortic atherosclerosis. Diverticulitis involving proximal descending colon is significantly improved compared to prior exam, with minimal inflammatory stranding remaining. Electronically Signed   By: Marijo Conception, M.D.   On: 04/15/2016 15:02   Ct Abdomen Pelvis W Contrast  Result Date: 04/13/2016 CLINICAL DATA:  Generalized abdominal pain and fever. EXAM: CT ABDOMEN AND PELVIS WITH CONTRAST TECHNIQUE: Multidetector CT imaging of the abdomen and pelvis was performed using the standard protocol following bolus administration of intravenous contrast. CONTRAST:  19mL ISOVUE-300 IOPAMIDOL (ISOVUE-300) INJECTION 61% COMPARISON:  02/28/2016 FINDINGS: Lower Chest: No acute findings.  Hepatobiliary: No masses identified. Tiny calcified gallstones again noted, without evidence of cholecystitis or biliary ductal dilatation. Pancreas:  No mass or inflammatory changes. Spleen: Within normal limits in size and appearance. Adrenals/Urinary Tract: No masses identified. Small renal cysts noted bilaterally. No evidence of hydronephrosis. Unremarkable urinary bladder. Stomach/Bowel: Diffuse colonic diverticulosis noted which is most severe in the sigmoid colon. Mild colonic wall thickening and pericolonic inflammatory changes are seen involving the proximal descending colon and area of diverticular disease, consistent with mild diverticulitis. No evidence of extraluminal air or abscess. Vascular/Lymphatic: No pathologically enlarged lymph nodes. No abdominal aortic aneurysm. Aortic atherosclerosis. Reproductive: Prior hysterectomy noted. Adnexal regions are unremarkable in appearance. Other:  None. Musculoskeletal:  No suspicious bone lesions identified. IMPRESSION: Mild diverticulitis involving the proximal descending colon. No evidence of abscess or other complication. Cholelithiasis.  No radiographic evidence of cholecystitis. Aortic atherosclerosis. Electronically Signed   By: Earle Gell M.D.   On: 04/13/2016 15:49    Microbiology: Recent Results (from the past 240 hour(s))  Urine culture     Status: None   Collection Time: 04/24/16 12:32 PM  Result Value Ref Range Status   Organism ID, Bacteria NO GROWTH  Final  Urine culture     Status: Abnormal   Collection Time: 04/29/16  5:21 AM  Result Value Ref Range Status   Specimen Description URINE, RANDOM  Final   Special Requests NONE  Final   Culture MULTIPLE SPECIES PRESENT, SUGGEST RECOLLECTION (A)  Final   Report Status 04/30/2016 FINAL  Final     Labs: Basic Metabolic Panel:  Recent Labs Lab 04/28/16 1613 04/29/16 0617 04/30/16 0442 05/02/16 0516  NA 138 135 138 140  K 4.5 3.8 4.1 3.6  CL 103 103 106 106  CO2 27 23 26 24    GLUCOSE 123* 89 102* 99  BUN 9 7 6  <5*  CREATININE 0.81 0.77 0.71 0.59  CALCIUM 8.9 8.3* 8.4* 8.7*  MG 2.0  --  1.9  --   PHOS 3.6  --   --   --    Liver Function Tests:  Recent Labs Lab 04/28/16 1613 04/29/16 0617  AST 17 16  ALT 8* 7*  ALKPHOS 44 44  BILITOT 1.1 1.3*  PROT 6.6 5.9*  ALBUMIN 3.5 3.2*   No results for input(s): LIPASE, AMYLASE in the last 168 hours. No results for input(s): AMMONIA in the last 168 hours. CBC:  Recent Labs Lab 04/28/16 1613 04/29/16 0617 05/02/16 0516  WBC 9.0 7.4 6.0  NEUTROABS 6.3  --   --   HGB 13.5 13.1 12.3  HCT 40.9 39.8 37.2  MCV 86.7 87.1 85.1  PLT 265 234 225   Cardiac Enzymes:  Recent Labs Lab 04/29/16 1030 04/29/16 1623  TROPONINI <0.03 <0.03   BNP: BNP (last 3 results)  Recent Labs  04/15/16 1050 04/28/16 1613  BNP 185.3* 23.6    ProBNP (last 3 results) No results for input(s): PROBNP in the last 8760 hours.  CBG: No results for input(s): GLUCAP in the last 168 hours.     SignedSTACI, DOONAN  [facility] 05/02/2016, 11:07 AM

## 2016-05-02 NOTE — Clinical Social Work Placement (Signed)
   CLINICAL SOCIAL WORK PLACEMENT  NOTE  Date:  05/02/2016  Patient Details  Name: [name] [name] MRN: [id number] Date of Birth: November 02, 1923  Clinical Social Work is seeking post-discharge placement for this patient at the [place] level of care (*CSW will initial, date and re-position this form in  chart as items are completed):  Yes   Patient/family provided with [place] Work Department's list of facilities offering this level of care within the geographic area requested by the patient (or if unable, by the patient's family).  Yes   Patient/family informed of their freedom to choose among providers that offer the needed level of care, that participate in Medicare, Medicaid or managed care program needed by the patient, have an available bed and are willing to accept the patient.  Yes   Patient/family informed of [facility]'s ownership interest in [facility] and [facility], as well as of the fact that they are under no obligation to receive care at these facilities.  PASRR submitted to EDS on       PASRR number received on       Existing PASRR number confirmed on 04/30/16     FL2 transmitted to all facilities in geographic area requested by pt/family on 04/30/16     FL2 transmitted to all facilities within larger geographic area on       Patient informed that his/her managed care company has contracts with or will negotiate with certain facilities, including the following:            Patient/family informed of bed offers received.  05/02/2016   Patient chooses bed at     [facility]  Physician recommends and patient chooses bed at      SNF Patient to be transferred to   on  .  05/02/2016   Patient to be transferred to facility by     EMS Patient family notified on   of transfer. Daughter  Name of family member notified:        PHYSICIAN Please sign FL2     Additional Comment:     _______________________________________________ Lilly Cove, LCSW 05/02/2016, 11:59 AM

## 2016-05-02 NOTE — Care Management Important Message (Signed)
Important Message  Patient Details  Name: [name] [name] MRN: [id number] Date of Birth: [birth year]   Medicare Important Message Given:  Yes    Elissia Spiewak 05/02/2016, 11:08 AM

## 2016-05-02 NOTE — Progress Notes (Addendum)
MEDICATION RELATED NOTE    Pharmacy Re:   Antibiotics for discharge Indication: Diverticulitis  Allergies  Allergen Reactions  . Azithromycin Hives, Diarrhea, Dermatitis and Rash  . Ciprofloxacin Swelling  . Levaquin [Levofloxacin] Hives  . Penicillins Hives    has had mild doses that she did not have reactions to  Has patient had a PCN reaction causing immediate rash, facial/tongue/throat swelling, SOB or lightheadedness with hypotension:Yes Has patient had a PCN reaction causing severe rash involving mucus membranes or skin necrosis: No Has patient had a PCN reaction that required hospitalization: no Has patient had a PCN reaction occurring within the last 10 years: no If all of the above answers are "NO", then may proceed with Cephalosporin use.   . Sulfamethoxazole Nausea And Vomiting   Assessment: 82yo with diverticulitis - was on Cefpodoxime + Metronidazole prior to admit and this regimen was completed on 12/11.  Meropenem added upon admission and she has been receiving this since 12/19.  She has multiple antibiotic allergies that reduces the opportunity to used preferred regimens at discharge.  Would consider a 7-10 day course for her antibiotics at discharge.  Choices for her outpatient regimen: Cefpodoxime (Vantin) (200mg  bid) + Metronidazole (250mg  bid - reduced dose due to higher risk of ADE's in elderly population) combination or use Cefpodoxime as a single agent Cefuroxime (Ceftin) with or without Metronidazole  It seems she has tolerated Cefpodoxime and is a third generation cephlasporin and would likely be the best option.    Rober Minion, PharmD., MS Clinical Pharmacist Pager:  [phone] Thank you for allowing pharmacy to be part of this patients care team. 05/02/2016,9:16 AM

## 2016-05-02 NOTE — Progress Notes (Signed)
Patient discharged to [facility] and was transported by daughter, reported to nurse Conley Simmonds.

## 2016-05-02 NOTE — Progress Notes (Signed)
OT Cancellation Note  Patient Details Name: [name] [name] MRN: [id number] DOB: [birth year]   Cancelled Treatment:    Reason Eval/Treat Not Completed: Other (comment): Attempted to see. Pt with nursing staff removing IV and preparing for D/C to SNF this afternoon. All further OT needs can be deferred to next venue of care. OT will sign off acutely.  [address] OTR/L [id number] 05/02/2016, 2:04 PM

## 2016-05-07 ENCOUNTER — Encounter: Payer: Self-pay | Admitting: Internal Medicine

## 2016-05-07 ENCOUNTER — Non-Acute Institutional Stay (SKILLED_NURSING_FACILITY): Payer: Medicare Other | Admitting: Internal Medicine

## 2016-05-07 ENCOUNTER — Telehealth: Payer: Self-pay

## 2016-05-07 DIAGNOSIS — R531 Weakness: Secondary | ICD-10-CM

## 2016-05-07 DIAGNOSIS — I2699 Other pulmonary embolism without acute cor pulmonale: Secondary | ICD-10-CM

## 2016-05-07 DIAGNOSIS — G301 Alzheimer's disease with late onset: Secondary | ICD-10-CM | POA: Diagnosis not present

## 2016-05-07 DIAGNOSIS — I10 Essential (primary) hypertension: Secondary | ICD-10-CM | POA: Diagnosis not present

## 2016-05-07 DIAGNOSIS — F0280 Dementia in other diseases classified elsewhere without behavioral disturbance: Secondary | ICD-10-CM | POA: Diagnosis not present

## 2016-05-07 DIAGNOSIS — F411 Generalized anxiety disorder: Secondary | ICD-10-CM | POA: Diagnosis not present

## 2016-05-07 DIAGNOSIS — K5792 Diverticulitis of intestine, part unspecified, without perforation or abscess without bleeding: Secondary | ICD-10-CM | POA: Diagnosis not present

## 2016-05-07 DIAGNOSIS — J438 Other emphysema: Secondary | ICD-10-CM

## 2016-05-07 DIAGNOSIS — Z8673 Personal history of transient ischemic attack (TIA), and cerebral infarction without residual deficits: Secondary | ICD-10-CM

## 2016-05-07 NOTE — Telephone Encounter (Signed)
LMTCB for pt's daughter. Pt needs hospital follow up appt.

## 2016-05-07 NOTE — Progress Notes (Signed)
LOCATION: [place]  PCP: Garret Reddish, MD   Code Status: Full Code  Goals of care: Advanced Directive information Advanced Directives 04/29/2016  Does Patient Have a Medical Advance Directive? Yes  Type of Paramedic of [place];Living will  Does patient want to make changes to medical advance directive? No - Patient declined  Copy of [place] in Chart? -  Pre-existing out of facility DNR order (yellow form or pink MOST form) -       Extended Emergency Contact Information Primary Emergency Contact: Connor,Catherine Address: [address], [place] Phone: [phone] Mobile Phone: [phone] Relation: Daughter   Allergies  Allergen Reactions  . Azithromycin Hives, Diarrhea, Dermatitis and Rash  . Ciprofloxacin Swelling  . Levaquin [Levofloxacin] Hives  . Penicillins Hives    has had mild doses that she did not have reactions to  Has patient had a PCN reaction causing immediate rash, facial/tongue/throat swelling, SOB or lightheadedness with hypotension:Yes Has patient had a PCN reaction causing severe rash involving mucus membranes or skin necrosis: No Has patient had a PCN reaction that required hospitalization: no Has patient had a PCN reaction occurring within the last 10 years: no If all of the above answers are "NO", then may proceed with Cephalosporin use.   . Sulfamethoxazole Nausea And Vomiting    Chief Complaint  Patient presents with  . New Admit To SNF    New Admission Visit      HPI:  Patient is a 82 y.o. female seen today for short term rehabilitation post hospital admission from 18th of December 2017-05/02/2016 with acute diverticulitis and acute pulmonary embolism. She was started on IV antibiotics and is now on eliquis for pulmonary embolism. She had a fall on 05/04/2016 with no apparent injury per nursing staff. Per nursing staff, she has  requested for oxygen at bedtime to help with her breathing and anxiety. She has medical history of atrial fibrillation, coronary artery disease, COPD among others. Patient is seen in her room today.  Review of Systems:  Constitutional: Negative for fever, chills, diaphoresis.  HENT: Negative for headache, congestion, nasal discharge. She is hard of hearing.  Eyes: Negative for double vision and discharge.  Respiratory: Negative for cough, wheezing.   she complains of shortness of breath with minimal exertion. Cardiovascular: Negative for chest pain, palpitations, leg swelling.  Gastrointestinal: Negative for heartburn, nausea, vomiting, abdominal pain. Genitourinary: Negative for dysuria Musculoskeletal: Negative for back pain.  Skin: Negative for itching, rash.  Neurological: Negative for dizziness. Positive for generalized weakness. Psychiatric/Behavioral: Negative for depression   Past Medical History:  Diagnosis Date  . Airway obstruction   . Allergic rhinitis   . Anal stricture   . ANXIETY 08/16/2008  . Atrial fibrillation ([place])    she denies known hx of afib 02/24/13  . CAD, UNSPECIFIED SITE 10/17/2008  . Candida esophagitis ([place])   . CHOLELITHIASIS 08/23/2008  . Chronic constipation   . Colitis    sigmoid  . Complication of anesthesia    difficult intubation  . COPD 01/04/2010   FeV1 101%, DLCO64% 2008  . COPD (chronic obstructive pulmonary disease) ([place]) 01/04/2010  . 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 Diverticulitis   . DIVERTICULOSIS-COLON 04/07/2008  . Emphysema of lung ([place])   . Esophageal stricture   . Fatty liver   . GERD 04/19/2007  . Hearing aid worn  bilateral  . Hiatal hernia   . History of diverticulitis of colon   . HYPERSOMNIA, ASSOCIATED WITH SLEEP APNEA 02/01/2007  . HYPERTENSION 11/12/2006  . IBS (irritable bowel syndrome)   . MITRAL VALVE PROLAPSE, HX OF 10/07/2008  . NEOPLASM, MALIGNANT, BREAST, RIGHT 02/15/2008  . OSTEOPOROSIS  04/04/2008  . Pulmonary fibrosis ([place])   . Raynaud's syndrome 04/25/2009  . Sleep apnea   . Tortuous colon   . UTI (urinary tract infection)    Past Surgical History:  Procedure Laterality Date  . ABDOMINAL HYSTERECTOMY    . APPENDECTOMY    . BILATERAL SALPINGOOPHORECTOMY     s/p prolapsed bladder  . BLADDER SURGERY     prolapsed  . BREAST CYST EXCISION Left   . BREAST SURGERY  07-21-07   mastectomy (right), all margins neg and LNs neg   . EYE SURGERY     cataract removal bilateral  . LUNG SURGERY    . MASTECTOMY Right 07/21/2007   all margins neg and LNs neg   . MELANOMA EXCISION     rt clavicle Dr Rhoderick [facility] office)  . MOUTH SURGERY    . SHOULDER OPEN ROTATOR CUFF REPAIR Right   . THORACOTOMY Left    granuloma, pulmonary-thoracotomy  . TONSILLECTOMY     Social History:   reports that she quit smoking about 33 years ago. Her smoking use included Cigarettes. She has a 60.00 pack-year smoking history. She has never used smokeless tobacco. She reports that she drinks alcohol. She reports that she does not use drugs.  Family History  Problem Relation Age of Onset  . Parkinsonism Father   . Coronary artery disease Father   . Stroke Mother   . Heart failure Mother   . Breast cancer      aunt  . Colon cancer      aunt, age 80  . Uterine cancer      aunt  . Breast cancer Maternal Aunt   . Colon cancer Maternal Aunt   . Uterine cancer Maternal Aunt   . Lung disease Neg Hx     Medications: Allergies as of 04/11/202017      Reactions   Azithromycin Hives, Diarrhea, Dermatitis, Rash   Ciprofloxacin Swelling   Levaquin [levofloxacin] Hives   Penicillins Hives   has had mild doses that she did not have reactions to Has patient had a PCN reaction causing immediate rash, facial/tongue/throat swelling, SOB or lightheadedness with hypotension:Yes Has patient had a PCN reaction causing severe rash involving mucus membranes or skin necrosis: No Has patient had a PCN reaction  that required hospitalization: no Has patient had a PCN reaction occurring within the last 10 years: no If all of the above answers are "NO", then may proceed with Cephalosporin use.   Sulfamethoxazole Nausea And Vomiting      Medication List       Accurate as of 05/07/16 12:36 PM. Always use your most recent med list.          acetaminophen 325 MG tablet Commonly known as:  TYLENOL Take 2 tablets (650 mg total) by mouth every 6 (six) hours as needed for mild pain (or Fever >/= 101).   albuterol 108 (90 Base) MCG/ACT inhaler Commonly known as:  PROVENTIL HFA;VENTOLIN HFA Inhale 2 puffs into the lungs every 6 (six) hours as needed for wheezing or shortness of breath.   apixaban 5 MG Tabs tablet Commonly known as:  ELIQUIS Take 1 tablet (5 mg total) by mouth 2 (two)  times daily. Start on 05/05/2016   aspirin 325 MG tablet Take 325 mg by mouth at bedtime.   budesonide-formoterol 160-4.5 MCG/ACT inhaler Commonly known as:  SYMBICORT Inhale 2 puffs into the lungs 2 (two) times daily.   cefpodoxime 200 MG tablet Commonly known as:  VANTIN Take 1 tablet (200 mg total) by mouth 2 (two) times daily.   diazepam 5 MG tablet Commonly known as:  VALIUM Take 1 tablet (5 mg total) by mouth at bedtime.   donepezil 10 MG tablet Commonly known as:  ARICEPT Take 1/2 tablet daily for 1 month, then increase to 1 tablet daily   fluticasone 50 MCG/ACT nasal spray Commonly known as:  FLONASE Place 2 sprays into both nostrils daily.   hydrOXYzine 25 MG tablet Commonly known as:  ATARAX/VISTARIL Take 25 mg by mouth 3 (three) times daily as needed.   insulin aspart 100 UNIT/ML injection Commonly known as:  novoLOG Inject 5 Units into the skin 3 (three) times daily before meals. If CBG is greater than 150   metroNIDAZOLE 250 MG tablet Commonly known as:  FLAGYL Take 1 tablet (250 mg total) by mouth 2 (two) times daily.   nitroGLYCERIN 0.4 MG SL tablet Commonly known as:   NITROSTAT Place 1 tablet (0.4 mg total) under the tongue every 5 (five) minutes as needed for chest pain (take as directed).   ondansetron 4 MG tablet Commonly known as:  ZOFRAN Take 1 tablet (4 mg total) by mouth every 8 (eight) hours as needed for nausea or vomiting.       Immunizations: Immunization History  Administered Date(s) Administered  . H1N1 05/09/2008  . Influenza Split 02/06/2011, 03/02/2013  . Influenza Whole 02/16/2007, 02/15/2008, 02/28/2009, 01/09/2010, 01/11/2012  . Influenza, High Dose Seasonal PF 01/31/2016  . Influenza,inj,Quad PF,36+ Mos 02/13/2014, 01/30/2015  . Pneumococcal Conjugate-13 02/20/2014  . Pneumococcal Polysaccharide-23 05/12/2001, 03/09/2009  . Td 05/12/2005  . Tdap 12/25/2010  . Zoster 04/14/2006     Physical Exam: Vitals:   05/07/16 1229  BP: (!) 145/61  Pulse: 67  Resp: 20  Temp: 98.1 F (36.7 C)  TempSrc: Oral  SpO2: 97%  Weight: 120 lb 12.8 oz (54.8 kg)  Height: 5\' 2"  (1.575 m)   Body mass index is 22.09 kg/m.  General- elderly female, Frail and thin built, in no acute distress Head- normocephalic, atraumatic Nose- no maxillary or frontal sinus tenderness, no nasal discharge Throat- moist mucus membrane Eyes- PERRLA, EOMI, no pallor, no icterus, no discharge, normal conjunctiva, normal sclera Neck- no cervical lymphadenopathy Cardiovascular- normal s1,s2, no murmur Respiratory- bilateral clear to auscultation, no wheeze, no rhonchi, no crackles, no use of accessory muscles Abdomen- bowel sounds present, soft, non tender Musculoskeletal- able to move all 4 extremities, generalized weakness, no leg edema  Neurological- alert and oriented to person, place and time Skin- warm and dry Psychiatry- normal mood and affect    Labs reviewed: Basic Metabolic Panel:  Recent Labs  04/28/16 1613 04/29/16 0617 04/30/16 0442 05/02/16 0516  NA 138 135 138 140  K 4.5 3.8 4.1 3.6  CL 103 103 106 106  CO2 27 23 26 24   GLUCOSE  123* 89 102* 99  BUN 9 7 6  <5*  CREATININE 0.81 0.77 0.71 0.59  CALCIUM 8.9 8.3* 8.4* 8.7*  MG 2.0  --  1.9  --   PHOS 3.6  --   --   --    Liver Function Tests:  Recent Labs  04/24/16 1232 04/28/16 1613 04/29/16 0617  AST 13 17 16   ALT 5 8* 7*  ALKPHOS 52 44 44  BILITOT 0.6 1.1 1.3*  PROT 6.1 6.6 5.9*  ALBUMIN 3.7 3.5 3.2*   No results for input(s): LIPASE, AMYLASE in the last 8760 hours. No results for input(s): AMMONIA in the last 8760 hours. CBC:  Recent Labs  04/15/16 1050 04/24/16 1232 04/28/16 1613 04/29/16 0617 05/02/16 0516  WBC 17.1* 6.2 9.0 7.4 6.0  NEUTROABS 16.3* 4.2 6.3  --   --   HGB 13.2 13.4 13.5 13.1 12.3  HCT 39.1 39.8 40.9 39.8 37.2  MCV 84.8 84.8 86.7 87.1 85.1  PLT 331 275.0 265 234 225   Cardiac Enzymes:  Recent Labs  02/25/16 2214 04/29/16 1030 04/29/16 1623  TROPONINI <0.03 <0.03 <0.03   BNP: Invalid input(s): POCBNP CBG:  Recent Labs  02/24/16 0523 02/25/16 0525 02/26/16 0523  GLUCAP 98 88 99    Radiological Exams: Dg Chest 2 View  Result Date: 04/24/2016 CLINICAL DATA:  Right anterior chest pain EXAM: CHEST  2 VIEW COMPARISON:  04/15/16 FINDINGS: Cardiomediastinal stress that cardiomegaly again noted. Hyperinflation again noted. There is chronic pleural thickening and scarring in left lower hemithorax with blunting of the costophrenic angle. Stable linear scarring left perihilar. Old left rib fracture. No definite superimposed infiltrate or pulmonary edema. Atherosclerotic calcifications of thoracic aorta. Thoracic spine osteopenia. IMPRESSION: Hyperinflation again noted. There is chronic pleural thickening and scarring in left lower hemithorax with blunting of the costophrenic angle. Stable linear scarring left perihilar. Old left rib fracture. No definite superimposed infiltrate or pulmonary edema. Electronically Signed   By: Lahoma Crocker M.D.   On: 04/24/2016 14:01   Dg Chest 2 View  Result Date: 04/15/2016 CLINICAL DATA:   Intermittent right-sided chest pain. Abdominal infection. EXAM: CHEST  2 VIEW COMPARISON:  02/28/2016.  11/23/2015.  07/25/2014. FINDINGS: There is chronic left ventricular enlargement. There is aortic atherosclerosis. There is chronic pleural and parenchymal scarring on the left, presumably secondary to old chest trauma. There is abnormal venous hypertension with interstitial edema and a small amount of fluid in the fissures. No acute bone finding. IMPRESSION: Chronic pleural and parenchymal scarring on the left. Probable fluid overload/ mild congestive heart failure superimposed. Aortic atherosclerosis. Electronically Signed   By: Nelson Chimes M.D.   On: 04/15/2016 11:34   Ct Angio Chest W/cm &/or Wo Cm  Result Date: 04/25/2016 CLINICAL DATA:  Right chest pain, shortness of breath, and elevated D-dimer. EXAM: CT ANGIOGRAPHY CHEST WITH CONTRAST TECHNIQUE: Multidetector CT imaging of the chest was performed using the standard protocol during bolus administration of intravenous contrast. Multiplanar CT image reconstructions and MIPs were obtained to evaluate the vascular anatomy. CONTRAST:  75 mL Isovue-300 COMPARISON:  Chest CTA 07/24/2013. FINDINGS: Cardiovascular: Pulmonary arterial opacification is adequate. There is a filling defect within a subsegmental pulmonary arterial branch in the right middle lobe (series 5, image 138). No segmental or more central filling defects are identified. There is thoracic aortic atherosclerosis without aneurysm. Coronary artery atherosclerosis is noted. There is no pericardial effusion. The heart is displaced leftward in the chest related to pectus excavatum deformity. Mediastinum/Nodes: Prior right mastectomy. Surgical clips in the right axilla. No enlarged axillary, mediastinal, or hilar lymph nodes are identified. Lungs/Pleura: No pleural effusion or pneumothorax. There is chronic scarring in the left upper lobe and to a lesser extent right middle lobe. There is mild left  lower lobe atelectasis. Left lung calcified granuloma is unchanged. There is mild-to-moderate centrilobular emphysema. Upper Abdomen:  Abdominal aortic atherosclerosis and colonic diverticulosis are partially visualized. Musculoskeletal: Pectus excavatum. Postthoracotomy changes to multiple left ribs. Review of the MIP images confirms the above findings. IMPRESSION: 1. Subsegmental pulmonary embolus in the right middle lobe. No central emboli. 2. Chronic lung scarring and centrilobular emphysema. 3. Aortic atherosclerosis. Critical Value/emergent results were called by telephone at the time of interpretation on 04/25/2016 at 4:17 pm to Dr. Garret Reddish , who verbally acknowledged these results. Electronically Signed   By: Logan Bores M.D.   On: 04/25/2016 16:21   Ct Abdomen Pelvis W Contrast  Result Date: 04/28/2016 CLINICAL DATA:  Left lower quadrant abdominal pain and emesis EXAM: CT ABDOMEN AND PELVIS WITH CONTRAST TECHNIQUE: Multidetector CT imaging of the abdomen and pelvis was performed using the standard protocol following bolus administration of intravenous contrast. CONTRAST:  100 ml ISOVUE-300 IOPAMIDOL (ISOVUE-300) INJECTION 61% COMPARISON:  CT abdomen pelvis 04/15/2016 FINDINGS: Lower chest: Bibasilar subsegmental atelectasis. Hepatobiliary: Normal hepatic size and contours without focal liver lesion. No perihepatic ascites. No intra- or extrahepatic biliary dilatation. Cholelithiasis without acute cholecystitis. Pancreas: Normal pancreatic contours and enhancement. No peripancreatic fluid collection or pancreatic ductal dilatation. Spleen: Normal. Adrenals/Urinary Tract: Normal adrenal glands. Left renal sinus cysts. No hydronephrosis or solid renal mass. The bilateral calcifications at the renal hila are suspected to be vascular. Stomach/Bowel: There is extensive descending colonic and rectosigmoid diverticulosis. There is focal colonic wall thickening and and surrounding inflammatory stranding  at the distal descending colon. No evidence of perforation. No fluid collection or abscess. There is no dilated small bowel. The appendix is not clearly identified. Vascular/Lymphatic: There is atherosclerotic calcification of the non aneurysmal abdominal aorta. No abdominal or pelvic adenopathy. Reproductive: No adnexal mass. Musculoskeletal: Multilevel lumbar facet arthrosis. Normal visualized extrathoracic and extraperitoneal soft tissues. Other: No contributory non-categorized findings. IMPRESSION: 1. Acute diverticulitis of the distal descending colon without abscess formation or evidence of perforation. 2. Cholelithiasis without acute inflammation. 3. Aortic atherosclerosis. Electronically Signed   By: Ulyses Jarred M.D.   On: 04/28/2016 20:14   Ct Abdomen Pelvis W Contrast  Result Date: 04/15/2016 CLINICAL DATA:  Generalized abdominal pain. EXAM: CT ABDOMEN AND PELVIS WITH CONTRAST TECHNIQUE: Multidetector CT imaging of the abdomen and pelvis was performed using the standard protocol following bolus administration of intravenous contrast. CONTRAST:  100 mL of Isovue-300 intravenously. COMPARISON:  CT scan of April 13, 2016. FINDINGS: Lower chest: Mild bibasilar subsegmental atelectasis is noted. Hepatobiliary: Small gallstone is noted.  Liver is unremarkable. Pancreas: Unremarkable. No pancreatic ductal dilatation or surrounding inflammatory changes. Spleen: Normal in size without focal abnormality. Adrenals/Urinary Tract: Adrenal glands appear normal. Left parapelvic cysts are noted. No hydronephrosis or renal obstruction is noted. Urinary bladder appears normal. No renal or ureteral calculi are noted. Stomach/Bowel: Diverticulosis of descending and sigmoid colon is noted. Inflammatory changes seen involving proximal descending colon are significantly improved compared to prior exam, with only minimal spur residual inflammatory stranding present. There is no evidence of bowel obstruction.  Vascular/Lymphatic: Aortic atherosclerosis. No enlarged abdominal or pelvic lymph nodes. Reproductive: Status post hysterectomy. No adnexal masses. Other: No abdominal wall hernia or abnormality. No abdominopelvic ascites. Musculoskeletal: Moderate degenerative disc disease is noted at L4-5. IMPRESSION: Small gallstone is noted. Aortic atherosclerosis. Diverticulitis involving proximal descending colon is significantly improved compared to prior exam, with minimal inflammatory stranding remaining. Electronically Signed   By: Marijo Conception, M.D.   On: 04/15/2016 15:02   Ct Abdomen Pelvis W Contrast  Result Date: 04/13/2016 CLINICAL DATA:  Generalized abdominal pain and fever. EXAM: CT ABDOMEN AND PELVIS WITH CONTRAST TECHNIQUE: Multidetector CT imaging of the abdomen and pelvis was performed using the standard protocol following bolus administration of intravenous contrast. CONTRAST:  17mL ISOVUE-300 IOPAMIDOL (ISOVUE-300) INJECTION 61% COMPARISON:  02/28/2016 FINDINGS: Lower Chest: No acute findings. Hepatobiliary: No masses identified. Tiny calcified gallstones again noted, without evidence of cholecystitis or biliary ductal dilatation. Pancreas:  No mass or inflammatory changes. Spleen: Within normal limits in size and appearance. Adrenals/Urinary Tract: No masses identified. Small renal cysts noted bilaterally. No evidence of hydronephrosis. Unremarkable urinary bladder. Stomach/Bowel: Diffuse colonic diverticulosis noted which is most severe in the sigmoid colon. Mild colonic wall thickening and pericolonic inflammatory changes are seen involving the proximal descending colon and area of diverticular disease, consistent with mild diverticulitis. No evidence of extraluminal air or abscess. Vascular/Lymphatic: No pathologically enlarged lymph nodes. No abdominal aortic aneurysm. Aortic atherosclerosis. Reproductive: Prior hysterectomy noted. Adnexal regions are unremarkable in appearance. Other:  None.  Musculoskeletal:  No suspicious bone lesions identified. IMPRESSION: Mild diverticulitis involving the proximal descending colon. No evidence of abscess or other complication. Cholelithiasis.  No radiographic evidence of cholecystitis. Aortic atherosclerosis. Electronically Signed   By: Earle Gell M.D.   On: 04/13/2016 15:49    Assessment/Plan  Generalized weakness From physical deconditioning. Will need for patient to work with physical therapy and occupational therapy team to help regain her strength and balance.  Acute diverticulitis Afebrile. Monitor WBC and temperature curve. Continue cefpodoxime in 200 mg every 12 hours and Flagyl 250 mg every 12 hours until 05/12/2016.   Acute pulmonary embolism Complains of dyspnea with minimal exertion. Would like to be on oxygen at bedtime. Oxygen saturation has been normal during the daytime. Will have her oxygen saturation be checked every shift for 1 week for now. Start oxygen 2 L/min by nasal cannula at bedtime and monitor.  COPD Continue Symbicort inhaler. Start oxygen 2 L/m by nasal cannula at bedtime for now and monitor. Continue when necessary Proventil.  Dementia without behavioral disturbance Currently on Aricept 5 mg daily until 06/02/2016. Then to be switched to Aricept 10 mg daily. Provide supportive care.  Generalized anxiety disorder Continue Valium 5 mg daily at bedtime.   History of CVA Continue aspirin 325 mg daily.     Goals of care: short term rehabilitation   Labs/tests ordered: CBC, CMP 05/08/16  Family/ staff Communication: reviewed care plan with patient and nursing supervisor    Blanchie Serve, MD Internal Medicine [place], [place] [address] Cell Phone (Monday-Friday 8 am - 5 pm): [phone] On Call: [phone] and follow prompts after 5 pm and on weekends Office Phone: [phone] Office Fax: [phone]

## 2016-05-08 LAB — HEPATIC FUNCTION PANEL
ALK PHOS: 52 U/L (ref 25–125)
ALT: 5 U/L — AB (ref 7–35)
AST: 11 U/L — AB (ref 13–35)
BILIRUBIN, TOTAL: 0.7 mg/dL

## 2016-05-08 LAB — CBC AND DIFFERENTIAL
HCT: 40 % (ref 36–46)
Hemoglobin: 13.1 g/dL (ref 12.0–16.0)
NEUTROS ABS: 3 /uL
Platelets: 232 10*3/uL (ref 150–399)
WBC: 6 10^3/mL

## 2016-05-08 LAB — BASIC METABOLIC PANEL
BUN: 12 mg/dL (ref 4–21)
Creatinine: 0.7 mg/dL (ref 0.5–1.1)
Glucose: 95 mg/dL
Potassium: 4 mmol/L (ref 3.4–5.3)
SODIUM: 142 mmol/L (ref 137–147)

## 2016-05-08 NOTE — Telephone Encounter (Signed)
Noted  

## 2016-05-08 NOTE — Telephone Encounter (Signed)
Pts daughter called to let you know that her mother is at [facility] and will be there for a couple weeks and will make an appointment upon her d/c from [facility].

## 2016-05-26 DIAGNOSIS — R0602 Shortness of breath: Secondary | ICD-10-CM | POA: Diagnosis not present

## 2016-05-26 DIAGNOSIS — G4733 Obstructive sleep apnea (adult) (pediatric): Secondary | ICD-10-CM | POA: Diagnosis not present

## 2016-05-27 DIAGNOSIS — G4733 Obstructive sleep apnea (adult) (pediatric): Secondary | ICD-10-CM | POA: Diagnosis not present

## 2016-05-27 DIAGNOSIS — R0602 Shortness of breath: Secondary | ICD-10-CM | POA: Diagnosis not present

## 2016-05-30 ENCOUNTER — Non-Acute Institutional Stay (SKILLED_NURSING_FACILITY): Payer: Medicare Other | Admitting: Adult Health

## 2016-05-30 ENCOUNTER — Encounter: Payer: Self-pay | Admitting: Adult Health

## 2016-05-30 DIAGNOSIS — G301 Alzheimer's disease with late onset: Secondary | ICD-10-CM

## 2016-05-30 DIAGNOSIS — K5792 Diverticulitis of intestine, part unspecified, without perforation or abscess without bleeding: Secondary | ICD-10-CM

## 2016-05-30 DIAGNOSIS — Z8673 Personal history of transient ischemic attack (TIA), and cerebral infarction without residual deficits: Secondary | ICD-10-CM | POA: Diagnosis not present

## 2016-05-30 DIAGNOSIS — I10 Essential (primary) hypertension: Secondary | ICD-10-CM

## 2016-05-30 DIAGNOSIS — I2699 Other pulmonary embolism without acute cor pulmonale: Secondary | ICD-10-CM

## 2016-05-30 DIAGNOSIS — J309 Allergic rhinitis, unspecified: Secondary | ICD-10-CM

## 2016-05-30 DIAGNOSIS — F419 Anxiety disorder, unspecified: Secondary | ICD-10-CM | POA: Diagnosis not present

## 2016-05-30 DIAGNOSIS — F0280 Dementia in other diseases classified elsewhere without behavioral disturbance: Secondary | ICD-10-CM | POA: Diagnosis not present

## 2016-05-30 DIAGNOSIS — J438 Other emphysema: Secondary | ICD-10-CM | POA: Diagnosis not present

## 2016-05-30 DIAGNOSIS — R531 Weakness: Secondary | ICD-10-CM

## 2016-05-30 NOTE — Progress Notes (Signed)
DATE:  05/30/2016   MRN:  [id number]  BIRTHDAY: [birth year]  Facility:  [facility]ursing Home Location:  [place] Room Number: [id number]  LEVEL OF CARE:  SNF [phone])  Contact Information    Name Relation Home Work Mobile   Connor,Catherine Daughter [phone]  [phone]   Kenda, Nakasone [phone]    [place]ony, [place] [phone]  [phone]       Code Status History    Date Active Date Inactive Code Status Order ID Comments User Context   04/28/2016  2:19 PM 05/02/2016  5:59 PM Full Code [id number]  Waldemar Dickens, MD Inpatient   04/13/2016  5:20 PM 04/14/2016  6:11 PM Full Code [id number]  Debbe Odea, MD ED   02/23/2016  5:42 AM 02/26/2016  7:06 PM Full Code [id number]  Edwin Dada, MD Inpatient   07/25/2014  9:10 PM 07/26/2014  6:56 PM Full Code [id number]  Etta Quill, DO ED   11/16/2013  9:51 PM 11/20/2013  5:27 PM Full Code [id number]  Allyne Gee, MD ED   08/09/2013 10:36 AM 11/16/2013  9:51 PM Full Code [id number]  Hennie Duos, MD Outpatient   07/29/2013 11:38 PM 08/09/2013 10:36 AM Full Code [id number]  Hennie Duos, MD Outpatient   07/24/2013  9:23 PM 07/27/2013  6:10 PM Full Code [id number]  Rise Patience, MD Inpatient   07/16/2013  5:16 PM 07/18/2013  7:44 PM Full Code [id number]  Robbie Lis, MD Inpatient   07/13/2013  6:35 PM 07/15/2013  5:22 PM Full Code [id number]  Hosie Poisson, MD Inpatient   03/30/2013 12:56 PM 04/01/2013  9:11 PM Full Code [id number]  Elsie Stain, MD Inpatient   03/14/2013  9:10 PM 03/17/2013  8:33 PM Full Code [id number]  Aldean Jewett, MD Inpatient    Advance Directive Documentation   Flowsheet Row Most Recent Value  Type of Advance Directive  Out of facility DNR (pink MOST or yellow form)  Pre-existing out of facility DNR order (yellow form or pink MOST form)  No data  "MOST" Form in Place?  No data       Chief Complaint  Patient presents with  . Discharge Note    HISTORY OF  PRESENT ILLNESS:  This is a 50-YO female seen for a discharge visit.  She is discharging home 05/30/2016 with home health PT, OT, ST, CNA, and nursing services.    She has been admitted to [facility] on 05/02/16 from [facility] with admission from 04/28/16 through 05/02/16. She was hospitalized with acute diverticulitis and acute pulmonary embolism. She was started on IV antibiotics and Eliquis.  Patient was admitted to this facility for short-term rehabilitation after the patient's recent hospitalization.  Patient has completed SNF rehabilitation and therapy has cleared the patient for discharge.    PAST MEDICAL HISTORY:  Past Medical History:  Diagnosis Date  . Airway obstruction   . Allergic rhinitis   . Anal stricture   . ANXIETY 08/16/2008  . Atrial fibrillation ([place])    she denies known hx of afib 02/24/13  . CAD, UNSPECIFIED SITE 10/17/2008  . Candida esophagitis ([place])   . CHOLELITHIASIS 08/23/2008  . Chronic constipation   . Colitis    sigmoid  . Complication of anesthesia    difficult intubation  . COPD 01/04/2010   FeV1 101%, DLCO64% 2008  . COPD (chronic obstructive pulmonary disease) ([place]) 01/04/2010  . 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  Diverticulitis   . DIVERTICULOSIS-COLON 04/07/2008  . Emphysema of lung ([place])   . Esophageal stricture   . Fatty liver   . GERD 04/19/2007  . Hearing aid worn    bilateral  . Hiatal hernia   . History of diverticulitis of colon   . HYPERSOMNIA, ASSOCIATED WITH SLEEP APNEA 02/01/2007  . HYPERTENSION 11/12/2006  . IBS (irritable bowel syndrome)   . MITRAL VALVE PROLAPSE, HX OF 10/07/2008  . NEOPLASM, MALIGNANT, BREAST, RIGHT 02/15/2008  . OSTEOPOROSIS 04/04/2008  . Pulmonary fibrosis ([place])   . Raynaud's syndrome 04/25/2009  . Sleep apnea   . Tortuous colon   . UTI (urinary tract infection)      CURRENT MEDICATIONS: Reviewed  Patient's Medications  New Prescriptions   No medications on file  Previous  Medications   ACETAMINOPHEN (TYLENOL) 325 MG TABLET    Take 2 tablets (650 mg total) by mouth every 6 (six) hours as needed for mild pain (or Fever >/= 101).   ALBUTEROL (PROVENTIL HFA;VENTOLIN HFA) 108 (90 BASE) MCG/ACT INHALER    Inhale 2 puffs into the lungs every 6 (six) hours as needed for wheezing or shortness of breath.   APIXABAN (ELIQUIS) 5 MG TABS TABLET    Take 1 tablet (5 mg total) by mouth 2 (two) times daily. Start on 05/05/2016   ASPIRIN 325 MG TABLET    Take 325 mg by mouth at bedtime.    BUDESONIDE-FORMOTEROL (SYMBICORT) 160-4.5 MCG/ACT INHALER    Inhale 2 puffs into the lungs 2 (two) times daily.   DIAZEPAM (VALIUM) 5 MG TABLET    Take 1 tablet (5 mg total) by mouth at bedtime.   DONEPEZIL (ARICEPT) 10 MG TABLET    Take 10 mg by mouth daily. Start 10 mg after finishing 1 month on 5 mg   DONEPEZIL (ARICEPT) 5 MG TABLET    Take 5 mg by mouth daily. Switch to 10 mg dosing qd on 06/03/16   FLUTICASONE (FLONASE) 50 MCG/ACT NASAL SPRAY    Place 2 sprays into both nostrils daily.   INSULIN ASPART (NOVOLOG) 100 UNIT/ML INJECTION    Inject 5 Units into the skin 3 (three) times daily before meals. If CBG is greater than 150   NITROGLYCERIN (NITROSTAT) 0.4 MG SL TABLET    Place 1 tablet (0.4 mg total) under the tongue every 5 (five) minutes as needed for chest pain (take as directed).   NUTRITIONAL SUPPLEMENT LIQD    Take 120 mLs by mouth 2 (two) times daily. MedPass   ONDANSETRON (ZOFRAN) 4 MG TABLET    Take 1 tablet (4 mg total) by mouth every 8 (eight) hours as needed for nausea or vomiting.  Modified Medications   No medications on file  Discontinued Medications   CEFPODOXIME (VANTIN) 200 MG TABLET    Take 1 tablet (200 mg total) by mouth 2 (two) times daily.   DONEPEZIL (ARICEPT) 10 MG TABLET    Take 1/2 tablet daily for 1 month, then increase to 1 tablet daily   HYDROXYZINE (ATARAX/VISTARIL) 25 MG TABLET    Take 25 mg by mouth 3 (three) times daily as needed.   METRONIDAZOLE (FLAGYL)  250 MG TABLET    Take 1 tablet (250 mg total) by mouth 2 (two) times daily.     Allergies  Allergen Reactions  . Azithromycin Hives, Diarrhea, Dermatitis and Rash  . Ciprofloxacin Swelling  . Levaquin [Levofloxacin] Hives  . Penicillins Hives    has had mild doses that she did not  have reactions to  Has patient had a PCN reaction causing immediate rash, facial/tongue/throat swelling, SOB or lightheadedness with hypotension:Yes Has patient had a PCN reaction causing severe rash involving mucus membranes or skin necrosis: No Has patient had a PCN reaction that required hospitalization: no Has patient had a PCN reaction occurring within the last 10 years: no If all of the above answers are "NO", then may proceed with Cephalosporin use.   . Sulfamethoxazole Nausea And Vomiting     REVIEW OF SYSTEMS:  GENERAL: no change in appetite, no fatigue, no weight changes, no fever, chills or weakness EYES: Denies change in vision, dry eyes, eye pain, itching or discharge EARS: Denies change in hearing, ringing in ears, or earache NOSE: Denies nasal congestion or epistaxis MOUTH and THROAT: Denies oral discomfort, gingival pain or bleeding, pain from teeth or hoarseness   RESPIRATORY: no cough, SOB, DOE, wheezing, hemoptysis CARDIAC: no chest pain, edema or palpitations GI: no abdominal pain, diarrhea, constipation, heart burn, nausea or vomiting GU: Denies dysuria, frequency, hematuria, incontinence, or discharge PSYCHIATRIC: Denies feeling of depression or anxiety. No report of hallucinations, insomnia, paranoia, or agitation    PHYSICAL EXAMINATION  GENERAL APPEARANCE: Well nourished. In no acute distress. Normal body habitus SKIN:  Skin is warm and dry.  HEAD: Normal in size and contour. No evidence of trauma EYES: Lids open and close normally. No blepharitis, entropion or ectropion. PERRL. Conjunctivae are clear and sclerae are white. Lenses are without opacity EARS: Pinnae are  normal. Hard of hearing MOUTH and THROAT: Lips are without lesions. Oral mucosa is moist and without lesions. Tongue is normal in shape, size, and color and without lesions NECK: supple, trachea midline, no neck masses, no thyroid tenderness, no thyromegaly LYMPHATICS: no LAN in the neck, no supraclavicular LAN RESPIRATORY: breathing is even & unlabored, BS CTAB CARDIAC: RRR, no murmur,no extra heart sounds, no edema GI: abdomen soft, normal BS, no masses, no tenderness, no hepatomegaly, no splenomegaly EXTREMITIES:  Able to move 4 extremities PSYCHIATRIC: Alert her son and place, disoriented to time. Affect and behavior are appropriate  LABS/RADIOLOGY: Labs reviewed: Basic Metabolic Panel:  Recent Labs  04/28/16 1613 04/29/16 0617 04/30/16 0442 05/02/16 0516 05/08/16  NA 138 135 138 140 142  K 4.5 3.8 4.1 3.6 4.0  CL 103 103 106 106  --   CO2 27 23 26 24   --   GLUCOSE 123* 89 102* 99  --   BUN 9 7 6  <5* 12  CREATININE 0.81 0.77 0.71 0.59 0.7  CALCIUM 8.9 8.3* 8.4* 8.7*  --   MG 2.0  --  1.9  --   --   PHOS 3.6  --   --   --   --    Liver Function Tests:  Recent Labs  04/24/16 1232 04/28/16 1613 04/29/16 0617 05/08/16  AST 13 17 16  11*  ALT 5 8* 7* 5*  ALKPHOS 52 44 44 52  BILITOT 0.6 1.1 1.3*  --   PROT 6.1 6.6 5.9*  --   ALBUMIN 3.7 3.5 3.2*  --     CBC:  Recent Labs  04/24/16 1232 04/28/16 1613 04/29/16 0617 05/02/16 0516 05/08/16  WBC 6.2 9.0 7.4 6.0 6.0  NEUTROABS 4.2 6.3  --   --  3  HGB 13.4 13.5 13.1 12.3 13.1  HCT 39.8 40.9 39.8 37.2 40  MCV 84.8 86.7 87.1 85.1  --   PLT 275.0 265 234 225 232    Cardiac Enzymes:  Recent Labs  02/25/16 2214 04/29/16 1030 04/29/16 1623  TROPONINI <0.03 <0.03 <0.03   CBG:  Recent Labs  02/24/16 0523 02/25/16 0525 02/26/16 0523  GLUCAP 98 88 99     ASSESSMENT/PLAN:  Generalized weakness - of her jaws for home health PT, OT, CNA, nursing and ST, or therapeutic strengthening exercises; follow-up  precautions  Acute diverticulitis - finished with her course of cefpodoxime time and Flagyl; no complaints of abdominal pain nor fever   Acute pulmonary embolism - no SOB; continue Eliquis  COPD - no SOB; continue Symbicort inhaler and Proventil when necessary   Dementia without behavioral disturbance - continue Aricept 5 mg 1 tab by mouth daily; continue supportive care and fall precautions  Allergic rhinitis - continue Flonase 50 g 2 sprays into both nostrils daily  Anxiety - mood is stable; continue volume 5 mg 1 tab by mouth daily at bedtime  CAD - stable; continue in the G-tube when necessary and aspirin 325 mg 1 tab by mouth daily at bedtime   History of CVA - continue aspirin 325 mg daily      I have filled out patient's discharge paperwork and written prescriptions.  Patient will receive home health PT, OT, ST, Nursing and CNA.  DME provided:  None  Total discharge time: Greater than 30 minutes Greater than 50% was spent in counseling and coordination of care with the patient.  Discharge time involved coordination of the discharge process with social worker, nursing staff and therapy department. Medical justification for home health services verified.    Neyland Pettengill C. Finderne - NP [employer] [phone]

## 2016-06-01 DIAGNOSIS — I2510 Atherosclerotic heart disease of native coronary artery without angina pectoris: Secondary | ICD-10-CM | POA: Diagnosis not present

## 2016-06-01 DIAGNOSIS — I482 Chronic atrial fibrillation: Secondary | ICD-10-CM | POA: Diagnosis not present

## 2016-06-01 DIAGNOSIS — J438 Other emphysema: Secondary | ICD-10-CM | POA: Diagnosis not present

## 2016-06-01 DIAGNOSIS — Z8719 Personal history of other diseases of the digestive system: Secondary | ICD-10-CM | POA: Diagnosis not present

## 2016-06-01 DIAGNOSIS — G301 Alzheimer's disease with late onset: Secondary | ICD-10-CM | POA: Diagnosis not present

## 2016-06-01 DIAGNOSIS — I2699 Other pulmonary embolism without acute cor pulmonale: Secondary | ICD-10-CM | POA: Diagnosis not present

## 2016-06-02 ENCOUNTER — Ambulatory Visit (INDEPENDENT_AMBULATORY_CARE_PROVIDER_SITE_OTHER): Payer: Medicare Other | Admitting: Family Medicine

## 2016-06-02 ENCOUNTER — Encounter: Payer: Self-pay | Admitting: Family Medicine

## 2016-06-02 VITALS — BP 108/72 | HR 81 | Temp 97.8°F | Ht 62.0 in | Wt 104.8 lb

## 2016-06-02 DIAGNOSIS — R110 Nausea: Secondary | ICD-10-CM

## 2016-06-02 DIAGNOSIS — I2699 Other pulmonary embolism without acute cor pulmonale: Secondary | ICD-10-CM

## 2016-06-02 DIAGNOSIS — R634 Abnormal weight loss: Secondary | ICD-10-CM | POA: Diagnosis not present

## 2016-06-02 DIAGNOSIS — K5792 Diverticulitis of intestine, part unspecified, without perforation or abscess without bleeding: Secondary | ICD-10-CM

## 2016-06-02 DIAGNOSIS — Z7189 Other specified counseling: Secondary | ICD-10-CM | POA: Diagnosis not present

## 2016-06-02 NOTE — Patient Instructions (Signed)
Consider outpatient palliative care consult in future  Follow up 1 month  Labs before you leave

## 2016-06-02 NOTE — Assessment & Plan Note (Signed)
S: Hospitalized 04/2016 for 2nd time- in different location. This time treated with flagyl and vantin for 10 days. Had encephalopathy due to infectious process which resolved and she hads returned to baseline of memory loss/dementia and [facility]. Has regained some but large loss of strength. At home is going to have [facility] through [facility] and speech for swallow study as well as rn 2x a week. No whas caregiver for Am meds, dressing, breakfast then returns at 6pm to help with Pm meds. Appetite has never returned- gets nauseous with eating but hungry if doesn't eat. Son now involved in addition to daughter A/P: diverticulitis appears to have resolved but has continued weakness/loss of strength (can walk down the hall 3x a day but feels significantly weaker and now has chronic nausea with eating0  will get cbc, cmp and ca125 (? Underlying malignancy not seenn on Ct). Schedule zofran once inAm to see if helps nausea.

## 2016-06-02 NOTE — Progress Notes (Signed)
Subjective:  [name] [name] is a 83 y.o. year old very pleasant female patient who presents for/with See problem oriented charting ROS- still with some right sided chest pain and mild shortness of breath but appears in no distress. No abdominal pain. No fever or chills.    Past Medical History-  Patient Active Problem List   Diagnosis Date Noted  . Goals of care, counseling/discussion     Priority: High  . Generalized weakness 04/28/2016    Priority: High  . Acute pulmonary embolism ([place]) 04/28/2016    Priority: High  . Diverticulitis 04/13/2016    Priority: High  . Syncope 02/23/2016    Priority: High  . Alzheimer's dementia 04/12/2014    Priority: High  . History of CVA (cerebrovascular accident) 07/29/2013    Priority: High  . Pulmonary nodules 07/29/2013    Priority: High  . COPD with emphysema [place] 01/04/2010    Priority: High  . Diastolic dysfunction 0000000    Priority: Medium  . Abdominal aortic atherosclerosis ([place]) 03/08/2016    Priority: Medium  . Insomnia 11/21/2013    Priority: Medium  . Recurrent UTI 07/13/2013    Priority: Medium  . History of breast cancer, T1b, N0, Lumpectomy 07/21/2007. 04/21/2011    Priority: Medium  . Essential hypertension 11/12/2006    Priority: Medium  . Allergic rhinitis 04/12/2014    Priority: Low  . Constipation 04/12/2014    Priority: Low  . Osteoarthritis, knee 04/12/2014    Priority: Low  . Raynaud's syndrome 04/25/2009    Priority: Low  . GERD 04/19/2007    Priority: Low  . Acute diverticulitis 04/29/2016  . Palliative care encounter   . Abdominal pain 04/28/2016  . Chest pain, atypical 01/22/2011    Medications- reviewed and updated Current Outpatient Prescriptions  Medication Sig Dispense Refill  . acetaminophen (TYLENOL) 325 MG tablet Take 2 tablets (650 mg total) by mouth every 6 (six) hours as needed for mild pain (or Fever >/= 101).    Marland Kitchen albuterol (PROVENTIL HFA;VENTOLIN HFA) 108 (90 Base) MCG/ACT inhaler  Inhale 2 puffs into the lungs every 6 (six) hours as needed for wheezing or shortness of breath. 1 Inhaler 0  . apixaban (ELIQUIS) 5 MG TABS tablet Take 1 tablet (5 mg total) by mouth 2 (two) times daily. Start on 05/05/2016 60 tablet 0  . aspirin 325 MG tablet Take 325 mg by mouth at bedtime.     . budesonide-formoterol (SYMBICORT) 160-4.5 MCG/ACT inhaler Inhale 2 puffs into the lungs 2 (two) times daily. 1 Inhaler 12  . diazepam (VALIUM) 5 MG tablet Take 1 tablet (5 mg total) by mouth at bedtime. 10 tablet 0  . [START ON 06/03/2016] donepezil (ARICEPT) 10 MG tablet Take 10 mg by mouth daily. Start 10 mg after finishing 1 month on 5 mg    . donepezil (ARICEPT) 5 MG tablet Take 5 mg by mouth daily. Switch to 10 mg dosing qd on 06/03/16    . fluticasone (FLONASE) 50 MCG/ACT nasal spray Place 2 sprays into both nostrils daily.    . insulin aspart (NOVOLOG) 100 UNIT/ML injection Inject 5 Units into the skin 3 (three) times daily before meals. If CBG is greater than 150    . nitroGLYCERIN (NITROSTAT) 0.4 MG SL tablet Place 1 tablet (0.4 mg total) under the tongue every 5 (five) minutes as needed for chest pain (take as directed). 25 tablet 3  . NUTRITIONAL SUPPLEMENT LIQD Take 120 mLs by mouth 2 (two) times daily. MedPass    .  ondansetron (ZOFRAN) 4 MG tablet Take 1 tablet (4 mg total) by mouth every 8 (eight) hours as needed for nausea or vomiting. 20 tablet 0   No current facility-administered medications for this visit.     Objective: BP 108/72 (BP Location: Left Arm, Patient Position: Sitting, Cuff Size: Normal)   Pulse 81   Temp 97.8 F (36.6 C) (Oral)   Ht 5\' 2"  (1.575 m)   Wt 104 lb 12.8 oz (47.5 kg)   SpO2 97%   BMI 19.17 kg/m  Gen: NAD, resting comfortably in wheelchair CV: RRR no murmurs rubs or gallops Lungs: CTAB no crackles, wheeze, rhonchi Abdomen: soft/nontender/nondistended/normal bowel sounds. No rebound or guarding.  Ext: no edema Skin: warm, dry, no  rash  Assessment/Plan:  Acute pulmonary embolism (HCC) S: pulmonary embolism detected in workup for right sided chest pain in late 2017. CTA 04/25/16 and started on xarelto that day. Transitioned to eliquis in hospital.  A/P: tolerating medicine. We will plan on 4-6 month therapy. I would lean towards 4 with small side and given age and other comborbidities. Luckily has not had any falls.    Goals of care, counseling/discussion Continued weakness and fatigue after hospital discharge. 8 more lbs weight loss- may have been food at [facility]. Close follo wup within 1 month. Daughter agreed to pay $53 since could not find code for ca 125. Possible malaise/worsening due to colon cancer not large enough to be detected on ct abd/pelvis and could not tolerate colonscopy. We discussed may be hard to interpret these results if positive. Had palliative consult in hospital but could repeat this on outpatient basis.   Diverticulitis S: Hospitalized 04/2016 for 2nd time- in different location. This time treated with flagyl and vantin for 10 days. Had encephalopathy due to infectious process which resolved and she hads returned to baseline of memory loss/dementia and remained [facility]. Has regained some but large loss of strength. At home is going to have [facility] and [facility] for swallow study as well as rn 2x a week. No whas caregiver for Am meds, dressing, breakfast then returns at 6pm to help with Pm meds. Appetite has never returned- gets nauseous with eating but hungry if doesn't eat. Son now involved in addition to daughter A/P: diverticulitis appears to have resolved but has continued weakness/loss of strength (can walk down the hall 3x a day but feels significantly weaker and now has chronic nausea with eating0  will get cbc, cmp and ca125 (? Underlying malignancy not seenn on Ct). Schedule zofran once inAm to see if helps nausea.   1 month Orders Placed This  Encounter  Procedures  . CBC with Differential/Platelet  . Comprehensive metabolic panel    [place]  . CA 125   The duration of face-to-face time during this visit was greater than 25 minutes. Greater than 50% of this time was spent in counseling surrounding mom's continued decline and potential of underlying malignancy or simply that this downward spiral is mother approaching end of her life.   Return precautions advised.  Garret Reddish, MD

## 2016-06-02 NOTE — Assessment & Plan Note (Signed)
S: pulmonary embolism detected in workup for right sided chest pain in late 2017. CTA 04/25/16 and started on xarelto that day. Transitioned to eliquis in hospital.  A/P: tolerating medicine. We will plan on 4-6 month therapy. I would lean towards 4 with small side and given age and other comborbidities. Luckily has not had any falls.

## 2016-06-02 NOTE — Progress Notes (Signed)
Pre visit review using our clinic review tool, if applicable. No additional management support is needed unless otherwise documented below in the visit note. 

## 2016-06-02 NOTE — Assessment & Plan Note (Signed)
Continued weakness and fatigue after hospital discharge. 8 more lbs weight loss- may have been food at [facility]. Close follo wup within 1 month. Daughter agreed to pay $53 since could not find code for ca 125. Possible malaise/worsening due to colon cancer not large enough to be detected on ct abd/pelvis and could not tolerate colonscopy. We discussed may be hard to interpret these results if positive. Had palliative consult in hospital but could repeat this on outpatient basis.

## 2016-06-03 ENCOUNTER — Encounter: Payer: Self-pay | Admitting: Family Medicine

## 2016-06-03 ENCOUNTER — Telehealth: Payer: Self-pay | Admitting: Family Medicine

## 2016-06-03 DIAGNOSIS — Z8719 Personal history of other diseases of the digestive system: Secondary | ICD-10-CM | POA: Diagnosis not present

## 2016-06-03 DIAGNOSIS — I2510 Atherosclerotic heart disease of native coronary artery without angina pectoris: Secondary | ICD-10-CM | POA: Diagnosis not present

## 2016-06-03 DIAGNOSIS — I2699 Other pulmonary embolism without acute cor pulmonale: Secondary | ICD-10-CM | POA: Diagnosis not present

## 2016-06-03 DIAGNOSIS — G301 Alzheimer's disease with late onset: Secondary | ICD-10-CM | POA: Diagnosis not present

## 2016-06-03 DIAGNOSIS — J438 Other emphysema: Secondary | ICD-10-CM | POA: Diagnosis not present

## 2016-06-03 DIAGNOSIS — I482 Chronic atrial fibrillation: Secondary | ICD-10-CM | POA: Diagnosis not present

## 2016-06-03 LAB — COMPREHENSIVE METABOLIC PANEL
ALBUMIN: 3.9 g/dL (ref 3.5–5.2)
ALT: 6 U/L (ref 0–35)
AST: 14 U/L (ref 0–37)
Alkaline Phosphatase: 48 U/L (ref 39–117)
BUN: 13 mg/dL (ref 6–23)
CALCIUM: 9.4 mg/dL (ref 8.4–10.5)
CHLORIDE: 102 meq/L (ref 96–112)
CO2: 31 meq/L (ref 19–32)
Creatinine, Ser: 0.78 mg/dL (ref 0.40–1.20)
GFR: 73.65 mL/min (ref >60.00)
Glucose, Bld: 103 mg/dL — ABNORMAL HIGH (ref 70–99)
POTASSIUM: 4.6 meq/L (ref 3.5–5.1)
SODIUM: 138 meq/L (ref 135–145)
Total Bilirubin: 0.5 mg/dL (ref 0.2–1.2)
Total Protein: 6.6 g/dL (ref 6.0–8.3)

## 2016-06-03 LAB — CBC WITH DIFFERENTIAL/PLATELET
Basophils Absolute: 0.1 10*3/uL (ref 0.0–0.1)
Basophils Relative: 1.4 % (ref 0.0–3.0)
EOS ABS: 0.1 10*3/uL (ref 0.0–0.7)
EOS PCT: 1.9 % (ref 0.0–5.0)
HCT: 40.9 % (ref 36.0–46.0)
HEMOGLOBIN: 13.5 g/dL (ref 12.0–15.0)
LYMPHS ABS: 2.1 10*3/uL (ref 0.7–4.0)
Lymphocytes Relative: 32.3 % (ref 12.0–46.0)
MCHC: 33 g/dL (ref 30.0–36.0)
MCV: 87.2 fl (ref 78.0–100.0)
MONO ABS: 0.3 10*3/uL (ref 0.1–1.0)
Monocytes Relative: 5.3 % (ref 3.0–12.0)
Neutro Abs: 3.9 10*3/uL (ref 1.4–7.7)
Neutrophils Relative %: 59.1 % (ref 43.0–77.0)
Platelets: 179 10*3/uL (ref 150.0–400.0)
RBC: 4.69 Mil/uL (ref 3.87–5.11)
RDW: 15 % (ref 11.5–15.5)
WBC: 6.6 10*3/uL (ref 4.0–10.5)

## 2016-06-03 LAB — CA 125: CA 125: 9 U/mL (ref <35)

## 2016-06-03 NOTE — Telephone Encounter (Signed)
Verbal orders provided as requested 

## 2016-06-03 NOTE — Telephone Encounter (Signed)
[name] [name] needs verbal  order for PT twice a wk for 1 wk and then  three time a wk for 4 wks and finally twice a wk for 3 wks

## 2016-06-04 ENCOUNTER — Telehealth: Payer: Self-pay | Admitting: Family Medicine

## 2016-06-04 ENCOUNTER — Other Ambulatory Visit: Payer: Self-pay

## 2016-06-04 DIAGNOSIS — R5383 Other fatigue: Secondary | ICD-10-CM

## 2016-06-04 DIAGNOSIS — R634 Abnormal weight loss: Secondary | ICD-10-CM

## 2016-06-04 NOTE — Telephone Encounter (Signed)
Called and left a voicemail message asking for a call back

## 2016-06-04 NOTE — Telephone Encounter (Signed)
° ° °  Clair Gulling a Adria Dill [facility] call to ask for verbal orders 2 times a week for 8 weeks   [phone]

## 2016-06-05 NOTE — Telephone Encounter (Signed)
Verbal order provided as requested 

## 2016-06-06 ENCOUNTER — Other Ambulatory Visit (INDEPENDENT_AMBULATORY_CARE_PROVIDER_SITE_OTHER): Payer: Medicare Other

## 2016-06-06 DIAGNOSIS — R5383 Other fatigue: Secondary | ICD-10-CM

## 2016-06-06 DIAGNOSIS — J438 Other emphysema: Secondary | ICD-10-CM | POA: Diagnosis not present

## 2016-06-06 DIAGNOSIS — I482 Chronic atrial fibrillation: Secondary | ICD-10-CM | POA: Diagnosis not present

## 2016-06-06 DIAGNOSIS — R634 Abnormal weight loss: Secondary | ICD-10-CM

## 2016-06-06 DIAGNOSIS — I2510 Atherosclerotic heart disease of native coronary artery without angina pectoris: Secondary | ICD-10-CM | POA: Diagnosis not present

## 2016-06-06 DIAGNOSIS — I2699 Other pulmonary embolism without acute cor pulmonale: Secondary | ICD-10-CM | POA: Diagnosis not present

## 2016-06-06 DIAGNOSIS — Z8719 Personal history of other diseases of the digestive system: Secondary | ICD-10-CM | POA: Diagnosis not present

## 2016-06-06 DIAGNOSIS — G301 Alzheimer's disease with late onset: Secondary | ICD-10-CM | POA: Diagnosis not present

## 2016-06-07 LAB — CEA: CEA: 1 ng/mL

## 2016-06-09 DIAGNOSIS — I482 Chronic atrial fibrillation: Secondary | ICD-10-CM | POA: Diagnosis not present

## 2016-06-09 DIAGNOSIS — I2510 Atherosclerotic heart disease of native coronary artery without angina pectoris: Secondary | ICD-10-CM | POA: Diagnosis not present

## 2016-06-09 DIAGNOSIS — I2699 Other pulmonary embolism without acute cor pulmonale: Secondary | ICD-10-CM | POA: Diagnosis not present

## 2016-06-09 DIAGNOSIS — J438 Other emphysema: Secondary | ICD-10-CM | POA: Diagnosis not present

## 2016-06-09 DIAGNOSIS — G301 Alzheimer's disease with late onset: Secondary | ICD-10-CM | POA: Diagnosis not present

## 2016-06-09 DIAGNOSIS — Z8719 Personal history of other diseases of the digestive system: Secondary | ICD-10-CM | POA: Diagnosis not present

## 2016-06-10 DIAGNOSIS — G301 Alzheimer's disease with late onset: Secondary | ICD-10-CM | POA: Diagnosis not present

## 2016-06-10 DIAGNOSIS — J438 Other emphysema: Secondary | ICD-10-CM | POA: Diagnosis not present

## 2016-06-10 DIAGNOSIS — I2699 Other pulmonary embolism without acute cor pulmonale: Secondary | ICD-10-CM | POA: Diagnosis not present

## 2016-06-10 DIAGNOSIS — I2510 Atherosclerotic heart disease of native coronary artery without angina pectoris: Secondary | ICD-10-CM | POA: Diagnosis not present

## 2016-06-10 DIAGNOSIS — Z8719 Personal history of other diseases of the digestive system: Secondary | ICD-10-CM | POA: Diagnosis not present

## 2016-06-10 DIAGNOSIS — I482 Chronic atrial fibrillation: Secondary | ICD-10-CM | POA: Diagnosis not present

## 2016-06-11 DIAGNOSIS — J438 Other emphysema: Secondary | ICD-10-CM | POA: Diagnosis not present

## 2016-06-11 DIAGNOSIS — I482 Chronic atrial fibrillation: Secondary | ICD-10-CM | POA: Diagnosis not present

## 2016-06-11 DIAGNOSIS — G301 Alzheimer's disease with late onset: Secondary | ICD-10-CM | POA: Diagnosis not present

## 2016-06-11 DIAGNOSIS — I2510 Atherosclerotic heart disease of native coronary artery without angina pectoris: Secondary | ICD-10-CM | POA: Diagnosis not present

## 2016-06-11 DIAGNOSIS — I2699 Other pulmonary embolism without acute cor pulmonale: Secondary | ICD-10-CM | POA: Diagnosis not present

## 2016-06-11 DIAGNOSIS — Z8719 Personal history of other diseases of the digestive system: Secondary | ICD-10-CM | POA: Diagnosis not present

## 2016-06-12 DIAGNOSIS — J438 Other emphysema: Secondary | ICD-10-CM | POA: Diagnosis not present

## 2016-06-12 DIAGNOSIS — G301 Alzheimer's disease with late onset: Secondary | ICD-10-CM | POA: Diagnosis not present

## 2016-06-12 DIAGNOSIS — I482 Chronic atrial fibrillation: Secondary | ICD-10-CM | POA: Diagnosis not present

## 2016-06-12 DIAGNOSIS — Z8719 Personal history of other diseases of the digestive system: Secondary | ICD-10-CM | POA: Diagnosis not present

## 2016-06-12 DIAGNOSIS — I2699 Other pulmonary embolism without acute cor pulmonale: Secondary | ICD-10-CM | POA: Diagnosis not present

## 2016-06-12 DIAGNOSIS — I2510 Atherosclerotic heart disease of native coronary artery without angina pectoris: Secondary | ICD-10-CM | POA: Diagnosis not present

## 2016-06-13 DIAGNOSIS — G301 Alzheimer's disease with late onset: Secondary | ICD-10-CM | POA: Diagnosis not present

## 2016-06-13 DIAGNOSIS — I2699 Other pulmonary embolism without acute cor pulmonale: Secondary | ICD-10-CM | POA: Diagnosis not present

## 2016-06-13 DIAGNOSIS — J438 Other emphysema: Secondary | ICD-10-CM | POA: Diagnosis not present

## 2016-06-13 DIAGNOSIS — I482 Chronic atrial fibrillation: Secondary | ICD-10-CM | POA: Diagnosis not present

## 2016-06-13 DIAGNOSIS — Z8719 Personal history of other diseases of the digestive system: Secondary | ICD-10-CM | POA: Diagnosis not present

## 2016-06-13 DIAGNOSIS — I2510 Atherosclerotic heart disease of native coronary artery without angina pectoris: Secondary | ICD-10-CM | POA: Diagnosis not present

## 2016-06-17 DIAGNOSIS — J438 Other emphysema: Secondary | ICD-10-CM | POA: Diagnosis not present

## 2016-06-17 DIAGNOSIS — G301 Alzheimer's disease with late onset: Secondary | ICD-10-CM | POA: Diagnosis not present

## 2016-06-17 DIAGNOSIS — Z8719 Personal history of other diseases of the digestive system: Secondary | ICD-10-CM | POA: Diagnosis not present

## 2016-06-17 DIAGNOSIS — I2699 Other pulmonary embolism without acute cor pulmonale: Secondary | ICD-10-CM | POA: Diagnosis not present

## 2016-06-17 DIAGNOSIS — I482 Chronic atrial fibrillation: Secondary | ICD-10-CM | POA: Diagnosis not present

## 2016-06-17 DIAGNOSIS — I2510 Atherosclerotic heart disease of native coronary artery without angina pectoris: Secondary | ICD-10-CM | POA: Diagnosis not present

## 2016-06-18 DIAGNOSIS — I2510 Atherosclerotic heart disease of native coronary artery without angina pectoris: Secondary | ICD-10-CM | POA: Diagnosis not present

## 2016-06-18 DIAGNOSIS — I2699 Other pulmonary embolism without acute cor pulmonale: Secondary | ICD-10-CM | POA: Diagnosis not present

## 2016-06-18 DIAGNOSIS — Z8719 Personal history of other diseases of the digestive system: Secondary | ICD-10-CM | POA: Diagnosis not present

## 2016-06-18 DIAGNOSIS — G301 Alzheimer's disease with late onset: Secondary | ICD-10-CM | POA: Diagnosis not present

## 2016-06-18 DIAGNOSIS — J438 Other emphysema: Secondary | ICD-10-CM | POA: Diagnosis not present

## 2016-06-18 DIAGNOSIS — I482 Chronic atrial fibrillation: Secondary | ICD-10-CM | POA: Diagnosis not present

## 2016-06-20 DIAGNOSIS — I2510 Atherosclerotic heart disease of native coronary artery without angina pectoris: Secondary | ICD-10-CM | POA: Diagnosis not present

## 2016-06-20 DIAGNOSIS — I482 Chronic atrial fibrillation: Secondary | ICD-10-CM | POA: Diagnosis not present

## 2016-06-20 DIAGNOSIS — Z8719 Personal history of other diseases of the digestive system: Secondary | ICD-10-CM | POA: Diagnosis not present

## 2016-06-20 DIAGNOSIS — I2699 Other pulmonary embolism without acute cor pulmonale: Secondary | ICD-10-CM | POA: Diagnosis not present

## 2016-06-20 DIAGNOSIS — J438 Other emphysema: Secondary | ICD-10-CM | POA: Diagnosis not present

## 2016-06-20 DIAGNOSIS — G301 Alzheimer's disease with late onset: Secondary | ICD-10-CM | POA: Diagnosis not present

## 2016-06-21 DIAGNOSIS — G301 Alzheimer's disease with late onset: Secondary | ICD-10-CM | POA: Diagnosis not present

## 2016-06-21 DIAGNOSIS — I2699 Other pulmonary embolism without acute cor pulmonale: Secondary | ICD-10-CM | POA: Diagnosis not present

## 2016-06-21 DIAGNOSIS — J438 Other emphysema: Secondary | ICD-10-CM | POA: Diagnosis not present

## 2016-06-21 DIAGNOSIS — Z8719 Personal history of other diseases of the digestive system: Secondary | ICD-10-CM | POA: Diagnosis not present

## 2016-06-21 DIAGNOSIS — I482 Chronic atrial fibrillation: Secondary | ICD-10-CM | POA: Diagnosis not present

## 2016-06-21 DIAGNOSIS — I2510 Atherosclerotic heart disease of native coronary artery without angina pectoris: Secondary | ICD-10-CM | POA: Diagnosis not present

## 2016-06-24 DIAGNOSIS — J438 Other emphysema: Secondary | ICD-10-CM | POA: Diagnosis not present

## 2016-06-24 DIAGNOSIS — I2510 Atherosclerotic heart disease of native coronary artery without angina pectoris: Secondary | ICD-10-CM | POA: Diagnosis not present

## 2016-06-24 DIAGNOSIS — I482 Chronic atrial fibrillation: Secondary | ICD-10-CM | POA: Diagnosis not present

## 2016-06-24 DIAGNOSIS — Z8719 Personal history of other diseases of the digestive system: Secondary | ICD-10-CM | POA: Diagnosis not present

## 2016-06-24 DIAGNOSIS — I2699 Other pulmonary embolism without acute cor pulmonale: Secondary | ICD-10-CM | POA: Diagnosis not present

## 2016-06-24 DIAGNOSIS — G301 Alzheimer's disease with late onset: Secondary | ICD-10-CM | POA: Diagnosis not present

## 2016-06-25 DIAGNOSIS — Z8719 Personal history of other diseases of the digestive system: Secondary | ICD-10-CM | POA: Diagnosis not present

## 2016-06-25 DIAGNOSIS — J438 Other emphysema: Secondary | ICD-10-CM | POA: Diagnosis not present

## 2016-06-25 DIAGNOSIS — I482 Chronic atrial fibrillation: Secondary | ICD-10-CM | POA: Diagnosis not present

## 2016-06-25 DIAGNOSIS — I2699 Other pulmonary embolism without acute cor pulmonale: Secondary | ICD-10-CM | POA: Diagnosis not present

## 2016-06-25 DIAGNOSIS — G301 Alzheimer's disease with late onset: Secondary | ICD-10-CM | POA: Diagnosis not present

## 2016-06-25 DIAGNOSIS — I2510 Atherosclerotic heart disease of native coronary artery without angina pectoris: Secondary | ICD-10-CM | POA: Diagnosis not present

## 2016-06-26 DIAGNOSIS — I2699 Other pulmonary embolism without acute cor pulmonale: Secondary | ICD-10-CM | POA: Diagnosis not present

## 2016-06-26 DIAGNOSIS — J438 Other emphysema: Secondary | ICD-10-CM | POA: Diagnosis not present

## 2016-06-26 DIAGNOSIS — I2510 Atherosclerotic heart disease of native coronary artery without angina pectoris: Secondary | ICD-10-CM | POA: Diagnosis not present

## 2016-06-26 DIAGNOSIS — Z8719 Personal history of other diseases of the digestive system: Secondary | ICD-10-CM | POA: Diagnosis not present

## 2016-06-26 DIAGNOSIS — G301 Alzheimer's disease with late onset: Secondary | ICD-10-CM | POA: Diagnosis not present

## 2016-06-26 DIAGNOSIS — I482 Chronic atrial fibrillation: Secondary | ICD-10-CM | POA: Diagnosis not present

## 2016-06-27 ENCOUNTER — Ambulatory Visit (INDEPENDENT_AMBULATORY_CARE_PROVIDER_SITE_OTHER): Payer: Medicare Other | Admitting: Family Medicine

## 2016-06-27 ENCOUNTER — Encounter: Payer: Self-pay | Admitting: Family Medicine

## 2016-06-27 VITALS — BP 104/82 | HR 88 | Temp 97.8°F | Ht 62.0 in | Wt 104.4 lb

## 2016-06-27 DIAGNOSIS — Z515 Encounter for palliative care: Secondary | ICD-10-CM

## 2016-06-27 DIAGNOSIS — R634 Abnormal weight loss: Secondary | ICD-10-CM

## 2016-06-27 DIAGNOSIS — G301 Alzheimer's disease with late onset: Secondary | ICD-10-CM | POA: Diagnosis not present

## 2016-06-27 DIAGNOSIS — R531 Weakness: Secondary | ICD-10-CM | POA: Diagnosis not present

## 2016-06-27 DIAGNOSIS — F0280 Dementia in other diseases classified elsewhere without behavioral disturbance: Secondary | ICD-10-CM | POA: Diagnosis not present

## 2016-06-27 NOTE — Progress Notes (Signed)
Pre visit review using our clinic review tool, if applicable. No additional management support is needed unless otherwise documented below in the visit note. 

## 2016-06-27 NOTE — Progress Notes (Signed)
Subjective:  [name] [name] is a 83 y.o. year old very pleasant female patient who presents for/with See problem oriented charting ROS- no fever, chills, chest pain, shortness of breath - reported. Does have some abdominal discomfort at times and low appetite.    Past Medical History-  Patient Active Problem List   Diagnosis Date Noted  . Goals of care, counseling/discussion     Priority: High  . Generalized weakness 04/28/2016    Priority: High  . Acute pulmonary embolism ([place]) 04/28/2016    Priority: High  . Diverticulitis 04/13/2016    Priority: High  . Syncope 02/23/2016    Priority: High  . Alzheimer's dementia 04/12/2014    Priority: High  . History of CVA (cerebrovascular accident) 07/29/2013    Priority: High  . Pulmonary nodules 07/29/2013    Priority: High  . COPD with emphysema Gold B 01/04/2010    Priority: High  . Diastolic dysfunction 0000000    Priority: Medium  . Abdominal aortic atherosclerosis ([place]) 03/08/2016    Priority: Medium  . Insomnia 11/21/2013    Priority: Medium  . Recurrent UTI 07/13/2013    Priority: Medium  . History of breast cancer, T1b, N0, Lumpectomy 07/21/2007. 04/21/2011    Priority: Medium  . Essential hypertension 11/12/2006    Priority: Medium  . Allergic rhinitis 04/12/2014    Priority: Low  . Constipation 04/12/2014    Priority: Low  . Osteoarthritis, knee 04/12/2014    Priority: Low  . Raynaud's syndrome 04/25/2009    Priority: Low  . GERD 04/19/2007    Priority: Low  . Acute diverticulitis 04/29/2016  . Palliative care encounter   . Abdominal pain 04/28/2016  . Chest pain, atypical 01/22/2011    Medications- reviewed and updated Current Outpatient Prescriptions  Medication Sig Dispense Refill  . acetaminophen (TYLENOL) 325 MG tablet Take 2 tablets (650 mg total) by mouth every 6 (six) hours as needed for mild pain (or Fever >/= 101).    Marland Kitchen albuterol (PROVENTIL HFA;VENTOLIN HFA) 108 (90 Base) MCG/ACT inhaler Inhale 2  puffs into the lungs every 6 (six) hours as needed for wheezing or shortness of breath. 1 Inhaler 0  . apixaban (ELIQUIS) 5 MG TABS tablet Take 1 tablet (5 mg total) by mouth 2 (two) times daily. Start on 05/05/2016 60 tablet 0  . aspirin 325 MG tablet Take 325 mg by mouth at bedtime.     . budesonide-formoterol (SYMBICORT) 160-4.5 MCG/ACT inhaler Inhale 2 puffs into the lungs 2 (two) times daily. 1 Inhaler 12  . diazepam (VALIUM) 5 MG tablet Take 1 tablet (5 mg total) by mouth at bedtime. 10 tablet 0  . donepezil (ARICEPT) 10 MG tablet Take 10 mg by mouth daily. Start 10 mg after finishing 1 month on 5 mg    . fluticasone (FLONASE) 50 MCG/ACT nasal spray Place 2 sprays into both nostrils daily.    . nitroGLYCERIN (NITROSTAT) 0.4 MG SL tablet Place 1 tablet (0.4 mg total) under the tongue every 5 (five) minutes as needed for chest pain (take as directed). 25 tablet 3  . NUTRITIONAL SUPPLEMENT LIQD Take 120 mLs by mouth 2 (two) times daily. MedPass    . ondansetron (ZOFRAN) 4 MG tablet Take 1 tablet (4 mg total) by mouth every 8 (eight) hours as needed for nausea or vomiting. 20 tablet 0   No current facility-administered medications for this visit.     Objective: BP 104/82 (BP Location: Left Arm, Patient Position: Sitting, Cuff Size: Normal)  Pulse 88   Temp 97.8 F (36.6 C) (Oral)   Ht 5\' 2"  (1.575 m)   Wt 104 lb 6.4 oz (47.4 kg)   SpO2 95%   BMI 19.10 kg/m  Gen: NAD, resting comfortably CV: RRR no murmurs rubs or gallops Lungs: CTAB no crackles, wheeze, rhonchi Abdomen: soft/nontender with distraction/nondistended/normal bowel sounds. No rebound or guarding.  Ext: no edema Skin: warm, dry, no rash  Assessment/Plan:  Alzheimer's dementia S: Patient has dementia. Her appetite continues to decrease despite someone coming to the house and setting up breakfast for her she does not get out of bed and when she does she barely eats. She states she just wants to sleep. On the other hand  she states she would prefer to eat more but has low appetite. Has had nausea in the past and she takes zofran each morning to try to help with appetite- fortunately she has not had any more weight loss since last visit. She does often complain of abdominal pain but it is not worsening. She has had diverticulitis and been hospitalized for this- but the level of pain and # of complaints has not increased since finishing antibipotics.   Daughter and patient seem to both be interested in appetite stimulant but with dementia I told them that from my past experience- not a lot of great data supporting their use in dementia.  A/P:Has seen palliative care in hospital for Loss of weight, weakness. Daughter and patient want to consider appetite stimulant and I would greatly appreciate input  From palliative caregiven advanced age and dementia. Referral was placed. To Contact daughter [address]  First visit without weight loss- we had also done a CEA as daughter questioned colon cancer but did not want to do colonoscopy. We discussed there could be underlying malignancy but most likely this is progression of dementia and potentially beginning of process leading to death  Orders Placed This Encounter  Procedures  . Amb Referral to Palliative Care    Referral Priority:   Routine    Referral Type:   Consultation    Number of Visits Requested:   1   The duration of face-to-face time during this visit was greater than 20 minutes. Greater than 50% of this time was spent in counseling, explanation of diagnosis, planning of further management, and/or coordination of care including discussion of palliative care and appetite stimulant.    Return precautions advised.  Garret Reddish, MD

## 2016-06-28 NOTE — Patient Instructions (Signed)
Will call about palliative care consult

## 2016-06-28 NOTE — Assessment & Plan Note (Signed)
S: Patient has dementia. Her appetite continues to decrease despite someone coming to the house and setting up breakfast for her she does not get out of bed and when she does she barely eats. She states she just wants to sleep. On the other hand she states she would prefer to eat more but has low appetite. Has had nausea in the past and she takes zofran each morning to try to help with appetite- fortunately she has not had any more weight loss since last visit. She does often complain of abdominal pain but it is not worsening. She has had diverticulitis and been hospitalized for this- but the level of pain and # of complaints has not increased since finishing antibipotics.   Daughter and patient seem to both be interested in appetite stimulant but with dementia I told them that from my past experience- not a lot of great data supporting their use in dementia.  A/P:Has seen palliative care in hospital for Loss of weight, weakness. Daughter and patient want to consider appetite stimulant and I would greatly appreciate input  From palliative caregiven advanced age and dementia. Referral was placed. To Contact daughter [place]

## 2016-06-30 DIAGNOSIS — L812 Freckles: Secondary | ICD-10-CM | POA: Diagnosis not present

## 2016-06-30 DIAGNOSIS — D225 Melanocytic nevi of trunk: Secondary | ICD-10-CM | POA: Diagnosis not present

## 2016-06-30 DIAGNOSIS — D1801 Hemangioma of skin and subcutaneous tissue: Secondary | ICD-10-CM | POA: Diagnosis not present

## 2016-06-30 DIAGNOSIS — L821 Other seborrheic keratosis: Secondary | ICD-10-CM | POA: Diagnosis not present

## 2016-06-30 DIAGNOSIS — Z85820 Personal history of malignant melanoma of skin: Secondary | ICD-10-CM | POA: Diagnosis not present

## 2016-07-01 ENCOUNTER — Other Ambulatory Visit: Payer: Self-pay | Admitting: Adult Health

## 2016-07-01 ENCOUNTER — Other Ambulatory Visit: Payer: Self-pay | Admitting: Family Medicine

## 2016-07-01 DIAGNOSIS — I2510 Atherosclerotic heart disease of native coronary artery without angina pectoris: Secondary | ICD-10-CM | POA: Diagnosis not present

## 2016-07-01 DIAGNOSIS — I482 Chronic atrial fibrillation: Secondary | ICD-10-CM | POA: Diagnosis not present

## 2016-07-01 DIAGNOSIS — Z8719 Personal history of other diseases of the digestive system: Secondary | ICD-10-CM | POA: Diagnosis not present

## 2016-07-01 DIAGNOSIS — R110 Nausea: Secondary | ICD-10-CM

## 2016-07-01 DIAGNOSIS — G301 Alzheimer's disease with late onset: Secondary | ICD-10-CM | POA: Diagnosis not present

## 2016-07-01 DIAGNOSIS — J438 Other emphysema: Secondary | ICD-10-CM | POA: Diagnosis not present

## 2016-07-01 DIAGNOSIS — I2699 Other pulmonary embolism without acute cor pulmonale: Secondary | ICD-10-CM | POA: Diagnosis not present

## 2016-07-01 NOTE — Telephone Encounter (Signed)
Dr Hunter pt. 

## 2016-07-02 DIAGNOSIS — Z8719 Personal history of other diseases of the digestive system: Secondary | ICD-10-CM | POA: Diagnosis not present

## 2016-07-02 DIAGNOSIS — I2699 Other pulmonary embolism without acute cor pulmonale: Secondary | ICD-10-CM | POA: Diagnosis not present

## 2016-07-02 DIAGNOSIS — I2510 Atherosclerotic heart disease of native coronary artery without angina pectoris: Secondary | ICD-10-CM | POA: Diagnosis not present

## 2016-07-02 DIAGNOSIS — G301 Alzheimer's disease with late onset: Secondary | ICD-10-CM | POA: Diagnosis not present

## 2016-07-02 DIAGNOSIS — J438 Other emphysema: Secondary | ICD-10-CM | POA: Diagnosis not present

## 2016-07-02 DIAGNOSIS — I482 Chronic atrial fibrillation: Secondary | ICD-10-CM | POA: Diagnosis not present

## 2016-07-03 DIAGNOSIS — J438 Other emphysema: Secondary | ICD-10-CM | POA: Diagnosis not present

## 2016-07-03 DIAGNOSIS — I2699 Other pulmonary embolism without acute cor pulmonale: Secondary | ICD-10-CM | POA: Diagnosis not present

## 2016-07-03 DIAGNOSIS — Z8719 Personal history of other diseases of the digestive system: Secondary | ICD-10-CM | POA: Diagnosis not present

## 2016-07-03 DIAGNOSIS — I482 Chronic atrial fibrillation: Secondary | ICD-10-CM | POA: Diagnosis not present

## 2016-07-03 DIAGNOSIS — G301 Alzheimer's disease with late onset: Secondary | ICD-10-CM | POA: Diagnosis not present

## 2016-07-03 DIAGNOSIS — I2510 Atherosclerotic heart disease of native coronary artery without angina pectoris: Secondary | ICD-10-CM | POA: Diagnosis not present

## 2016-07-04 ENCOUNTER — Other Ambulatory Visit: Payer: Self-pay | Admitting: Family Medicine

## 2016-07-04 ENCOUNTER — Telehealth: Payer: Self-pay | Admitting: Family Medicine

## 2016-07-04 DIAGNOSIS — R531 Weakness: Secondary | ICD-10-CM | POA: Diagnosis not present

## 2016-07-04 DIAGNOSIS — G301 Alzheimer's disease with late onset: Secondary | ICD-10-CM | POA: Diagnosis not present

## 2016-07-04 DIAGNOSIS — Z8719 Personal history of other diseases of the digestive system: Secondary | ICD-10-CM | POA: Diagnosis not present

## 2016-07-04 DIAGNOSIS — I482 Chronic atrial fibrillation: Secondary | ICD-10-CM | POA: Diagnosis not present

## 2016-07-04 DIAGNOSIS — J438 Other emphysema: Secondary | ICD-10-CM | POA: Diagnosis not present

## 2016-07-04 DIAGNOSIS — I2510 Atherosclerotic heart disease of native coronary artery without angina pectoris: Secondary | ICD-10-CM | POA: Diagnosis not present

## 2016-07-04 DIAGNOSIS — I2699 Other pulmonary embolism without acute cor pulmonale: Secondary | ICD-10-CM | POA: Diagnosis not present

## 2016-07-04 MED ORDER — MIRTAZAPINE 7.5 MG PO TABS: 7.5000 mg | ORAL_TABLET | Freq: Every day | ORAL | 2 refills | Status: AC

## 2016-07-04 NOTE — Telephone Encounter (Signed)
I sent in remeron 7.5mg .

## 2016-07-04 NOTE — Telephone Encounter (Signed)
Inez Catalina with pallative care of [place] called to advise pt's family asked if pt could be put on a appetite stimulant. Nurse practitoner has recommended  Remerone 7.5 mg.  Hopes Dr Yong Channel will agree and call in a new Rx for pt to  [id number] - [place], [place]. AT [address]

## 2016-07-07 DIAGNOSIS — J438 Other emphysema: Secondary | ICD-10-CM | POA: Diagnosis not present

## 2016-07-07 DIAGNOSIS — Z8719 Personal history of other diseases of the digestive system: Secondary | ICD-10-CM | POA: Diagnosis not present

## 2016-07-07 DIAGNOSIS — I2699 Other pulmonary embolism without acute cor pulmonale: Secondary | ICD-10-CM | POA: Diagnosis not present

## 2016-07-07 DIAGNOSIS — I482 Chronic atrial fibrillation: Secondary | ICD-10-CM | POA: Diagnosis not present

## 2016-07-07 DIAGNOSIS — G301 Alzheimer's disease with late onset: Secondary | ICD-10-CM | POA: Diagnosis not present

## 2016-07-07 DIAGNOSIS — I2510 Atherosclerotic heart disease of native coronary artery without angina pectoris: Secondary | ICD-10-CM | POA: Diagnosis not present

## 2016-07-08 DIAGNOSIS — J438 Other emphysema: Secondary | ICD-10-CM | POA: Diagnosis not present

## 2016-07-08 DIAGNOSIS — I2510 Atherosclerotic heart disease of native coronary artery without angina pectoris: Secondary | ICD-10-CM | POA: Diagnosis not present

## 2016-07-08 DIAGNOSIS — I482 Chronic atrial fibrillation: Secondary | ICD-10-CM | POA: Diagnosis not present

## 2016-07-08 DIAGNOSIS — I2699 Other pulmonary embolism without acute cor pulmonale: Secondary | ICD-10-CM | POA: Diagnosis not present

## 2016-07-08 DIAGNOSIS — Z8719 Personal history of other diseases of the digestive system: Secondary | ICD-10-CM | POA: Diagnosis not present

## 2016-07-08 DIAGNOSIS — G301 Alzheimer's disease with late onset: Secondary | ICD-10-CM | POA: Diagnosis not present

## 2016-07-08 NOTE — Telephone Encounter (Signed)
Spoke with Inez Catalina and let her know prescription had been sent in

## 2016-07-09 DIAGNOSIS — I482 Chronic atrial fibrillation: Secondary | ICD-10-CM | POA: Diagnosis not present

## 2016-07-09 DIAGNOSIS — I2699 Other pulmonary embolism without acute cor pulmonale: Secondary | ICD-10-CM | POA: Diagnosis not present

## 2016-07-09 DIAGNOSIS — G301 Alzheimer's disease with late onset: Secondary | ICD-10-CM | POA: Diagnosis not present

## 2016-07-09 DIAGNOSIS — J438 Other emphysema: Secondary | ICD-10-CM | POA: Diagnosis not present

## 2016-07-09 DIAGNOSIS — I2510 Atherosclerotic heart disease of native coronary artery without angina pectoris: Secondary | ICD-10-CM | POA: Diagnosis not present

## 2016-07-09 DIAGNOSIS — Z8719 Personal history of other diseases of the digestive system: Secondary | ICD-10-CM | POA: Diagnosis not present

## 2016-07-10 DIAGNOSIS — J438 Other emphysema: Secondary | ICD-10-CM | POA: Diagnosis not present

## 2016-07-10 DIAGNOSIS — I2510 Atherosclerotic heart disease of native coronary artery without angina pectoris: Secondary | ICD-10-CM | POA: Diagnosis not present

## 2016-07-10 DIAGNOSIS — I482 Chronic atrial fibrillation: Secondary | ICD-10-CM | POA: Diagnosis not present

## 2016-07-10 DIAGNOSIS — G301 Alzheimer's disease with late onset: Secondary | ICD-10-CM | POA: Diagnosis not present

## 2016-07-10 DIAGNOSIS — Z8719 Personal history of other diseases of the digestive system: Secondary | ICD-10-CM | POA: Diagnosis not present

## 2016-07-10 DIAGNOSIS — I2699 Other pulmonary embolism without acute cor pulmonale: Secondary | ICD-10-CM | POA: Diagnosis not present

## 2016-07-14 DIAGNOSIS — R531 Weakness: Secondary | ICD-10-CM | POA: Diagnosis not present

## 2016-07-14 DIAGNOSIS — I482 Chronic atrial fibrillation: Secondary | ICD-10-CM | POA: Diagnosis not present

## 2016-07-14 DIAGNOSIS — Z8719 Personal history of other diseases of the digestive system: Secondary | ICD-10-CM | POA: Diagnosis not present

## 2016-07-14 DIAGNOSIS — G301 Alzheimer's disease with late onset: Secondary | ICD-10-CM | POA: Diagnosis not present

## 2016-07-14 DIAGNOSIS — J438 Other emphysema: Secondary | ICD-10-CM | POA: Diagnosis not present

## 2016-07-14 DIAGNOSIS — I2510 Atherosclerotic heart disease of native coronary artery without angina pectoris: Secondary | ICD-10-CM | POA: Diagnosis not present

## 2016-07-14 DIAGNOSIS — I2699 Other pulmonary embolism without acute cor pulmonale: Secondary | ICD-10-CM | POA: Diagnosis not present

## 2016-07-15 DIAGNOSIS — G301 Alzheimer's disease with late onset: Secondary | ICD-10-CM | POA: Diagnosis not present

## 2016-07-15 DIAGNOSIS — J438 Other emphysema: Secondary | ICD-10-CM | POA: Diagnosis not present

## 2016-07-15 DIAGNOSIS — I482 Chronic atrial fibrillation: Secondary | ICD-10-CM | POA: Diagnosis not present

## 2016-07-15 DIAGNOSIS — Z8719 Personal history of other diseases of the digestive system: Secondary | ICD-10-CM | POA: Diagnosis not present

## 2016-07-15 DIAGNOSIS — I2510 Atherosclerotic heart disease of native coronary artery without angina pectoris: Secondary | ICD-10-CM | POA: Diagnosis not present

## 2016-07-15 DIAGNOSIS — I2699 Other pulmonary embolism without acute cor pulmonale: Secondary | ICD-10-CM | POA: Diagnosis not present

## 2016-07-16 ENCOUNTER — Ambulatory Visit (INDEPENDENT_AMBULATORY_CARE_PROVIDER_SITE_OTHER): Payer: Medicare Other | Admitting: Family Medicine

## 2016-07-16 ENCOUNTER — Encounter: Payer: Self-pay | Admitting: Family Medicine

## 2016-07-16 VITALS — BP 90/66 | HR 82 | Temp 97.6°F | Ht 62.0 in | Wt 98.8 lb

## 2016-07-16 DIAGNOSIS — R64 Cachexia: Secondary | ICD-10-CM | POA: Diagnosis not present

## 2016-07-16 DIAGNOSIS — I2510 Atherosclerotic heart disease of native coronary artery without angina pectoris: Secondary | ICD-10-CM | POA: Diagnosis not present

## 2016-07-16 DIAGNOSIS — F0280 Dementia in other diseases classified elsewhere without behavioral disturbance: Secondary | ICD-10-CM

## 2016-07-16 DIAGNOSIS — Z8719 Personal history of other diseases of the digestive system: Secondary | ICD-10-CM | POA: Diagnosis not present

## 2016-07-16 DIAGNOSIS — G301 Alzheimer's disease with late onset: Secondary | ICD-10-CM

## 2016-07-16 DIAGNOSIS — I2699 Other pulmonary embolism without acute cor pulmonale: Secondary | ICD-10-CM | POA: Diagnosis not present

## 2016-07-16 DIAGNOSIS — I482 Chronic atrial fibrillation: Secondary | ICD-10-CM | POA: Diagnosis not present

## 2016-07-16 DIAGNOSIS — Z66 Do not resuscitate: Secondary | ICD-10-CM

## 2016-07-16 DIAGNOSIS — J438 Other emphysema: Secondary | ICD-10-CM | POA: Diagnosis not present

## 2016-07-16 DIAGNOSIS — E43 Unspecified severe protein-calorie malnutrition: Secondary | ICD-10-CM

## 2016-07-16 MED ORDER — APIXABAN 5 MG PO TABS: 5.0000 mg | ORAL_TABLET | Freq: Two times a day (BID) | ORAL | 0 refills | Status: DC

## 2016-07-16 NOTE — Progress Notes (Signed)
Pre visit review using our clinic review tool, if applicable. No additional management support is needed unless otherwise documented below in the visit note. 

## 2016-07-16 NOTE — Patient Instructions (Signed)
No changes today  I am here for support  I think the transition is very reasonable

## 2016-07-16 NOTE — Progress Notes (Signed)
Subjective:  [name] [name] is a 83 y.o. year old very pleasant female patient who presents for/with See problem oriented charting ROS- no fever or chills. No cough or congestion. No blood in stool- per daughter.    Past Medical History-  Patient Active Problem List   Diagnosis Date Noted  . Goals of care, counseling/discussion     Priority: High  . Generalized weakness 04/28/2016    Priority: High  . Acute pulmonary embolism ([place]) 04/28/2016    Priority: High  . Diverticulitis 04/13/2016    Priority: High  . Syncope 02/23/2016    Priority: High  . Alzheimer's dementia 04/12/2014    Priority: High  . History of CVA (cerebrovascular accident) 07/29/2013    Priority: High  . Pulmonary nodules 07/29/2013    Priority: High  . COPD with emphysema Gold B 01/04/2010    Priority: High  . Diastolic dysfunction 89/[birth year]1941    Priority: Medium  . Abdominal aortic atherosclerosis ([place]) 03/08/2016    Priority: Medium  . Insomnia 11/21/2013    Priority: Medium  . Recurrent UTI 07/13/2013    Priority: Medium  . History of breast cancer, T1b, N0, Lumpectomy 07/21/2007. 04/21/2011    Priority: Medium  . Essential hypertension 11/12/2006    Priority: Medium  . Allergic rhinitis 04/12/2014    Priority: Low  . Constipation 04/12/2014    Priority: Low  . Osteoarthritis, knee 04/12/2014    Priority: Low  . Raynaud's syndrome 04/25/2009    Priority: Low  . GERD 04/19/2007    Priority: Low  . Acute diverticulitis 04/29/2016  . Palliative care encounter   . Abdominal pain 04/28/2016  . Chest pain, atypical 01/22/2011    Medications- reviewed and updated Current Outpatient Prescriptions  Medication Sig Dispense Refill  . acetaminophen (TYLENOL) 325 MG tablet Take 2 tablets (650 mg total) by mouth every 6 (six) hours as needed for mild pain (or Fever >/= 101).    Marland Kitchen albuterol (PROVENTIL HFA;VENTOLIN HFA) 108 (90 Base) MCG/ACT inhaler Inhale 2 puffs into the lungs every 6 (six) hours as needed  for wheezing or shortness of breath. 1 Inhaler 0  . apixaban (ELIQUIS) 5 MG TABS tablet Take 1 tablet (5 mg total) by mouth 2 (two) times daily. Start on 05/05/2016 60 tablet 0  . budesonide-formoterol (SYMBICORT) 160-4.5 MCG/ACT inhaler Inhale 2 puffs into the lungs 2 (two) times daily. 1 Inhaler 12  . diazepam (VALIUM) 5 MG tablet TAKE 1 TABLET BY MOUTH AT BEDTIME 30 tablet 2  . donepezil (ARICEPT) 10 MG tablet Take 10 mg by mouth daily. Start 10 mg after finishing 1 month on 5 mg    . fluticasone (FLONASE) 50 MCG/ACT nasal spray Place 2 sprays into both nostrils daily.    . mirtazapine (REMERON) 7.5 MG tablet Take 1 tablet (7.5 mg total) by mouth at bedtime. 30 tablet 2  . nitroGLYCERIN (NITROSTAT) 0.4 MG SL tablet Place 1 tablet (0.4 mg total) under the tongue every 5 (five) minutes as needed for chest pain (take as directed). 25 tablet 3  . NUTRITIONAL SUPPLEMENT LIQD Take 120 mLs by mouth 2 (two) times daily. MedPass    . ondansetron (ZOFRAN) 4 MG tablet TAKE 1 TABLET (4 MG TOTAL) BY MOUTH EVERY 8 (EIGHT) HOURS AS NEEDED FOR NAUSEA OR VOMITING. 20 tablet 1   No current facility-administered medications for this visit.     Objective: BP 90/66 (BP Location: Left Arm, Patient Position: Sitting, Cuff Size: Normal)   Pulse 82   Temp  97.6 F (36.4 C) (Oral)   Ht 5\' 2"  (1.575 m)   Wt 98 lb 12.8 oz (44.8 kg)   SpO2 96%   BMI 18.07 kg/m  Gen: NAD, resting comfortably in wheelchair, appears stated age- stays in wheelchair entire visit CV: RRR no murmurs rubs or gallops Lungs: CTAB no crackles, wheeze, rhonchi Abdomen: soft/nontender/nondistended/normal bowel sounds. No rebound or guarding. Very thin.  Ext: no edema Skin: warm, dry, no rash  Assessment/Plan:  Alzheimer's dementia Weight loss/inanition S: Patient has had very low appetite despite having someone come over to help encourage her to eat and taking zofran daily.   In addition last visit we added remeron AFTER a consultation  with palliative care. Despite this patient has continued to lose weight- down another 5-6 lbs in under a month.   She is not going downstairs anymore to eat. She feels weaker. Enjoys ot and pt- biggest motivation of the day. Feels somewhat sick to stomach when she eats unless she leoves the food A/P:  She has seen hospice/palliative care. She will be presented to transition to hospice. Even with remeron has lost weight- primarily due to weight loss. Daughter asks about cancer- we could do PET scan but would not want chemo. Asks about cologuard but would not want colonoscopy. We discussed if would not take next step then doing workup likely not helpful either. This is inanition/weight loss- though not clearly from her alzhemers.   She is now DNR/DNI but would want IV antibiotics. She has had atypical presentations of diverticulitis but usually had some abdominal pain and has none at this time so will not repeat scan.  Doubt ischemic colitis but is possible- though should be with all foods- not just the ones she doesn't like.   Did opted to continue eliquis one more month then return to aspirin- daughter does not want to withdraw medications   No planned follow up- likely most visits will transition to hospice but I am available certainly  Meds ordered this encounter  Medications  . apixaban (ELIQUIS) 5 MG TABS tablet    Sig: Take 1 tablet (5 mg total) by mouth 2 (two) times daily. Start on 05/05/2016    Dispense:  60 tablet    Refill:  0   The duration of face-to-face time during this visit was greater than 25 minutes. Greater than 50% of this time was spent in counseling, explanation of diagnosis, planning of further management, and/or coordination of care including transition to hospice conversation, removing medicines, working up cancer discussoins.   Return precautions advised.  Garret Reddish, MD

## 2016-07-16 NOTE — Assessment & Plan Note (Addendum)
Weight loss/inanition Protein calorie malnutrition S: Patient has had very low appetite despite having someone come over to help encourage her to eat and taking zofran daily.   In addition last visit we added remeron AFTER a consultation with palliative care. Despite this patient has continued to lose weight- down another 5-6 lbs in under a month.   She is not going downstairs anymore to eat. She feels weaker. Enjoys ot and pt- biggest motivation of the day. Feels somewhat sick to stomach when she eats unless she leoves the food A/P:  She has seen hospice/palliative care. She will be presented to transition to hospice. Even with remeron has lost weight- primarily due to weight loss. Daughter asks about cancer- we could do PET scan but would not want chemo. Asks about cologuard but would not want colonoscopy. We discussed if would not take next step then doing workup likely not helpful either. This is inanition/weight loss- though not clearly from her alzhemers.   She is now DNR/DNI but would want IV antibiotics. She has had atypical presentations of diverticulitis but usually had some abdominal pain and has none at this time so will not repeat scan.  Doubt ischemic colitis but is possible- though should be with all foods- not just the ones she doesn't like.   Did opted to continue eliquis one more month then return to aspirin- daughter does not want to withdraw medications

## 2016-07-17 DIAGNOSIS — J438 Other emphysema: Secondary | ICD-10-CM | POA: Diagnosis not present

## 2016-07-17 DIAGNOSIS — Z8719 Personal history of other diseases of the digestive system: Secondary | ICD-10-CM | POA: Diagnosis not present

## 2016-07-17 DIAGNOSIS — I2510 Atherosclerotic heart disease of native coronary artery without angina pectoris: Secondary | ICD-10-CM | POA: Diagnosis not present

## 2016-07-17 DIAGNOSIS — I2699 Other pulmonary embolism without acute cor pulmonale: Secondary | ICD-10-CM | POA: Diagnosis not present

## 2016-07-17 DIAGNOSIS — I482 Chronic atrial fibrillation: Secondary | ICD-10-CM | POA: Diagnosis not present

## 2016-07-17 DIAGNOSIS — G301 Alzheimer's disease with late onset: Secondary | ICD-10-CM | POA: Diagnosis not present

## 2016-07-21 DIAGNOSIS — R531 Weakness: Secondary | ICD-10-CM | POA: Diagnosis not present

## 2016-07-22 ENCOUNTER — Telehealth: Payer: Self-pay | Admitting: Family Medicine

## 2016-07-22 DIAGNOSIS — I2510 Atherosclerotic heart disease of native coronary artery without angina pectoris: Secondary | ICD-10-CM | POA: Diagnosis not present

## 2016-07-22 DIAGNOSIS — I482 Chronic atrial fibrillation: Secondary | ICD-10-CM | POA: Diagnosis not present

## 2016-07-22 DIAGNOSIS — Z8719 Personal history of other diseases of the digestive system: Secondary | ICD-10-CM | POA: Diagnosis not present

## 2016-07-22 DIAGNOSIS — J438 Other emphysema: Secondary | ICD-10-CM | POA: Diagnosis not present

## 2016-07-22 DIAGNOSIS — I2699 Other pulmonary embolism without acute cor pulmonale: Secondary | ICD-10-CM | POA: Diagnosis not present

## 2016-07-22 DIAGNOSIS — G301 Alzheimer's disease with late onset: Secondary | ICD-10-CM | POA: Diagnosis not present

## 2016-07-22 NOTE — Telephone Encounter (Signed)
Betty with hospice and palliative care of [place] requesting orders to be removed from palliative care and transferred to hospice. Would like to verify Dr Yong Channel will remain the attending.

## 2016-07-23 DIAGNOSIS — G301 Alzheimer's disease with late onset: Secondary | ICD-10-CM | POA: Diagnosis not present

## 2016-07-23 DIAGNOSIS — I2699 Other pulmonary embolism without acute cor pulmonale: Secondary | ICD-10-CM | POA: Diagnosis not present

## 2016-07-23 DIAGNOSIS — I2510 Atherosclerotic heart disease of native coronary artery without angina pectoris: Secondary | ICD-10-CM | POA: Diagnosis not present

## 2016-07-23 DIAGNOSIS — Z8719 Personal history of other diseases of the digestive system: Secondary | ICD-10-CM | POA: Diagnosis not present

## 2016-07-23 DIAGNOSIS — J438 Other emphysema: Secondary | ICD-10-CM | POA: Diagnosis not present

## 2016-07-23 DIAGNOSIS — I482 Chronic atrial fibrillation: Secondary | ICD-10-CM | POA: Diagnosis not present

## 2016-07-23 NOTE — Telephone Encounter (Signed)
Called Inez Catalina and left a voicemail message asking for a return phone call

## 2016-07-23 NOTE — Telephone Encounter (Signed)
Yes thanks, may provide verbal orders

## 2016-07-24 DIAGNOSIS — I2510 Atherosclerotic heart disease of native coronary artery without angina pectoris: Secondary | ICD-10-CM | POA: Diagnosis not present

## 2016-07-24 DIAGNOSIS — I2699 Other pulmonary embolism without acute cor pulmonale: Secondary | ICD-10-CM | POA: Diagnosis not present

## 2016-07-24 DIAGNOSIS — I10 Essential (primary) hypertension: Secondary | ICD-10-CM | POA: Diagnosis not present

## 2016-07-24 DIAGNOSIS — J449 Chronic obstructive pulmonary disease, unspecified: Secondary | ICD-10-CM | POA: Diagnosis not present

## 2016-07-24 DIAGNOSIS — I4891 Unspecified atrial fibrillation: Secondary | ICD-10-CM | POA: Diagnosis not present

## 2016-07-24 DIAGNOSIS — G301 Alzheimer's disease with late onset: Secondary | ICD-10-CM | POA: Diagnosis not present

## 2016-07-24 DIAGNOSIS — Z8719 Personal history of other diseases of the digestive system: Secondary | ICD-10-CM | POA: Diagnosis not present

## 2016-07-24 DIAGNOSIS — G309 Alzheimer's disease, unspecified: Secondary | ICD-10-CM | POA: Diagnosis not present

## 2016-07-24 DIAGNOSIS — J309 Allergic rhinitis, unspecified: Secondary | ICD-10-CM | POA: Diagnosis not present

## 2016-07-24 DIAGNOSIS — I482 Chronic atrial fibrillation: Secondary | ICD-10-CM | POA: Diagnosis not present

## 2016-07-24 DIAGNOSIS — I2609 Other pulmonary embolism with acute cor pulmonale: Secondary | ICD-10-CM | POA: Diagnosis not present

## 2016-07-24 DIAGNOSIS — I679 Cerebrovascular disease, unspecified: Secondary | ICD-10-CM | POA: Diagnosis not present

## 2016-07-24 DIAGNOSIS — J438 Other emphysema: Secondary | ICD-10-CM | POA: Diagnosis not present

## 2016-07-24 DIAGNOSIS — N390 Urinary tract infection, site not specified: Secondary | ICD-10-CM | POA: Diagnosis not present

## 2016-07-24 DIAGNOSIS — E46 Unspecified protein-calorie malnutrition: Secondary | ICD-10-CM | POA: Diagnosis not present

## 2016-07-24 DIAGNOSIS — G4733 Obstructive sleep apnea (adult) (pediatric): Secondary | ICD-10-CM | POA: Diagnosis not present

## 2016-07-24 NOTE — Telephone Encounter (Signed)
Spoke with Inez Catalina who verbalized understanding

## 2016-07-25 ENCOUNTER — Other Ambulatory Visit: Payer: Self-pay | Admitting: Family Medicine

## 2016-07-25 ENCOUNTER — Encounter: Payer: Self-pay | Admitting: Family Medicine

## 2016-07-25 ENCOUNTER — Ambulatory Visit (INDEPENDENT_AMBULATORY_CARE_PROVIDER_SITE_OTHER): Admitting: Family Medicine

## 2016-07-25 VITALS — HR 89 | Temp 98.0°F | Ht 62.0 in

## 2016-07-25 DIAGNOSIS — R300 Dysuria: Secondary | ICD-10-CM | POA: Diagnosis not present

## 2016-07-25 DIAGNOSIS — E46 Unspecified protein-calorie malnutrition: Secondary | ICD-10-CM | POA: Diagnosis not present

## 2016-07-25 DIAGNOSIS — G309 Alzheimer's disease, unspecified: Secondary | ICD-10-CM | POA: Diagnosis not present

## 2016-07-25 DIAGNOSIS — I679 Cerebrovascular disease, unspecified: Secondary | ICD-10-CM | POA: Diagnosis not present

## 2016-07-25 DIAGNOSIS — G4733 Obstructive sleep apnea (adult) (pediatric): Secondary | ICD-10-CM | POA: Diagnosis not present

## 2016-07-25 DIAGNOSIS — I2609 Other pulmonary embolism with acute cor pulmonale: Secondary | ICD-10-CM | POA: Diagnosis not present

## 2016-07-25 DIAGNOSIS — J449 Chronic obstructive pulmonary disease, unspecified: Secondary | ICD-10-CM | POA: Diagnosis not present

## 2016-07-25 LAB — POC URINALSYSI DIPSTICK (AUTOMATED)
Glucose, UA: NEGATIVE
NITRITE UA: POSITIVE
PH UA: 5.5 (ref 5.0–8.0)
Spec Grav, UA: 1.03 (ref 1.030–1.035)
Urobilinogen, UA: 1 (ref ?–2.0)

## 2016-07-25 MED ORDER — NITROFURANTOIN MONOHYD MACRO 100 MG PO CAPS: 100.0000 mg | ORAL_CAPSULE | Freq: Two times a day (BID) | ORAL | 0 refills | Status: DC

## 2016-07-25 MED ORDER — APIXABAN 5 MG PO TABS: 5.0000 mg | ORAL_TABLET | Freq: Two times a day (BID) | ORAL | 0 refills | Status: DC

## 2016-07-25 NOTE — Progress Notes (Signed)
[name] [name]- daughter reported I hadnt sent in eliquis but I saw I sent in 07/16/16. I sent again today. Can you check with daughter/pharmacy

## 2016-07-25 NOTE — Patient Instructions (Signed)
Please take the antibiotic as instructed.  I hope you are feeling better soon! Seek care immediately if worsening, new concerns or you are not improving with treatment.

## 2016-07-25 NOTE — Progress Notes (Signed)
Pre visit review using our clinic review tool, if applicable. No additional management support is needed unless otherwise documented below in the visit note. 

## 2016-07-25 NOTE — Progress Notes (Signed)
HPI:  Acute visit for Dysuria: -started 2 days ago -symptoms: burning with urination, mild bladder discomfort with urination, frequency -denies: fevers, hematuria, nausea, vomiting, flank pain, vaginal symptoms, skin irritation or rash  Also requests message to PCP for refill eliquis.  ROS: See pertinent positives and negatives per HPI.  Past Medical History:  Diagnosis Date  . Airway obstruction   . Allergic rhinitis   . Anal stricture   . ANXIETY 08/16/2008  . Atrial fibrillation ([place])    she denies known hx of afib 02/24/13  . CAD, UNSPECIFIED SITE 10/17/2008  . Candida esophagitis ([place])   . CHOLELITHIASIS 08/23/2008  . Chronic constipation   . Colitis    sigmoid  . Complication of anesthesia    difficult intubation  . COPD 01/04/2010   FeV1 101%, DLCO64% 2008  . COPD (chronic obstructive pulmonary disease) ([place]) 01/04/2010  . 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 Diverticulitis   . DIVERTICULOSIS-COLON 04/07/2008  . Emphysema of lung ([place])   . Esophageal stricture   . Fatty liver   . GERD 04/19/2007  . Hearing aid worn    bilateral  . Hiatal hernia   . History of diverticulitis of colon   . HYPERSOMNIA, ASSOCIATED WITH SLEEP APNEA 02/01/2007  . HYPERTENSION 11/12/2006  . IBS (irritable bowel syndrome)   . MITRAL VALVE PROLAPSE, HX OF 10/07/2008  . NEOPLASM, MALIGNANT, BREAST, RIGHT 02/15/2008  . OSTEOPOROSIS 04/04/2008  . Pulmonary fibrosis ([place])   . Raynaud's syndrome 04/25/2009  . Sleep apnea   . Tortuous colon   . UTI (urinary tract infection)     Past Surgical History:  Procedure Laterality Date  . ABDOMINAL HYSTERECTOMY    . APPENDECTOMY    . BILATERAL SALPINGOOPHORECTOMY     s/p prolapsed bladder  . BLADDER SURGERY     prolapsed  . BREAST CYST EXCISION Left   . BREAST SURGERY  07-21-07   mastectomy (right), all margins neg and LNs neg   . EYE SURGERY     cataract removal bilateral  . LUNG SURGERY    . MASTECTOMY Right 07/21/2007   all  margins neg and LNs neg   . MELANOMA EXCISION     rt clavicle Dr Rhoderick [facility] office)  . MOUTH SURGERY    . SHOULDER OPEN ROTATOR CUFF REPAIR Right   . THORACOTOMY Left    granuloma, pulmonary-thoracotomy  . TONSILLECTOMY      Family History  Problem Relation Age of Onset  . Parkinsonism Father   . Coronary artery disease Father   . Stroke Mother   . Heart failure Mother   . Breast cancer      aunt  . Colon cancer      aunt, age 79  . Uterine cancer      aunt  . Breast cancer Maternal Aunt   . Colon cancer Maternal Aunt   . Uterine cancer Maternal Aunt   . Lung disease Neg Hx     Social History   Social History  . Marital status: Widowed    Spouse name: N/A  . Number of children: 3  . Years of education: N/A   Occupational History  . retireed Retired   Social History Main Topics  . Smoking status: Former Smoker    Packs/day: 2.00    Years: 30.00    Types: Cigarettes    Quit date: 05/13/1983  . Smokeless tobacco: Never Used  . Alcohol use 0.0 oz/week     Comment: 1-2 glaases wine per  day  . Drug use: No  . Sexual activity: No   Other Topics Concern  . None   Social History Narrative   Living in assisted living at [facility]. Widowed 1991. 3 children. 7 grandkids. No greatgrandkids.       Walks down for her meals with a walker.    ADL-bath, clothe, cleaning   IADL-daughter does finances      HCPOA- daughter [place]an [place]   Full code for now- have counseled and advised consider DNR/DNI   States enjoys living and would want resuscitation currently      [facility]:   Patient is originally from [place]. Has traveled to nearly ever one of the [place] states. She has prior travel to [place] & the [place]. Previously enjoyed gardening. 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 Remote bird exposure to a canary as a child. Previously had bird houses in her yard that she cleaned out.      Current Outpatient Prescriptions:  .   acetaminophen (TYLENOL) 325 MG tablet, Take 2 tablets (650 mg total) by mouth every 6 (six) hours as needed for mild pain (or Fever >/= 101)., Disp: , Rfl:  .  albuterol (PROVENTIL HFA;VENTOLIN HFA) 108 (90 Base) MCG/ACT inhaler, Inhale 2 puffs into the lungs every 6 (six) hours as needed for wheezing or shortness of breath., Disp: 1 Inhaler, Rfl: 0 .  budesonide-formoterol (SYMBICORT) 160-4.5 MCG/ACT inhaler, Inhale 2 puffs into the lungs 2 (two) times daily., Disp: 1 Inhaler, Rfl: 12 .  diazepam (VALIUM) 5 MG tablet, TAKE 1 TABLET BY MOUTH AT BEDTIME, Disp: 30 tablet, Rfl: 2 .  donepezil (ARICEPT) 10 MG tablet, Take 10 mg by mouth daily. Start 10 mg after finishing 1 month on 5 mg, Disp: , Rfl:  .  fluticasone (FLONASE) 50 MCG/ACT nasal spray, Place 2 sprays into both nostrils daily., Disp: , Rfl:  .  mirtazapine (REMERON) 7.5 MG tablet, Take 1 tablet (7.5 mg total) by mouth at bedtime., Disp: 30 tablet, Rfl: 2 .  nitroGLYCERIN (NITROSTAT) 0.4 MG SL tablet, Place 1 tablet (0.4 mg total) under the tongue every 5 (five) minutes as needed for chest pain (take as directed)., Disp: 25 tablet, Rfl: 3 .  NUTRITIONAL SUPPLEMENT LIQD, Take 120 mLs by mouth 2 (two) times daily. MedPass, Disp: , Rfl:  .  ondansetron (ZOFRAN) 4 MG tablet, TAKE 1 TABLET (4 MG TOTAL) BY MOUTH EVERY 8 (EIGHT) HOURS AS NEEDED FOR NAUSEA OR VOMITING., Disp: 20 tablet, Rfl: 1 .  apixaban (ELIQUIS) 5 MG TABS tablet, Take 1 tablet (5 mg total) by mouth 2 (two) times daily. Start on 05/05/2016, Disp: 60 tablet, Rfl: 0 .  nitrofurantoin, macrocrystal-monohydrate, (MACROBID) 100 MG capsule, Take 1 capsule (100 mg total) by mouth 2 (two) times daily., Disp: 14 capsule, Rfl: 0  EXAM:  Vitals:   07/25/16 1225  Pulse: 89  Temp: 98 F (36.7 C)    There is no height or weight on file to calculate BMI.  GENERAL: vitals reviewed and listed above, alert, pleasant, appears well hydrated and in no acute distress  LUNGS: clear to  auscultation bilaterally  CV: HRRR  ABD: soft, NTTP, no CVA TTP  MS: in wheelchair  PSYCH: pleasant and cooperative, no obvious depression or anxiety  ASSESSMENT AND PLAN:  Discussed the following assessment and plan:  Dysuria - Plan: POCT Urinalysis Dipstick (Automated)  -no systemic symptoms -udip + symptoms c/w UTI - opted for macrobid after  discussion options/pt has tolerated well before per daughter -message to PCP about elequis per their request - he sent -Patient advised to return or notify a doctor immediately if symptoms worsen or persist or new concerns arise.  Patient Instructions  Please take the antibiotic as instructed.  I hope you are feeling better soon! Seek care immediately if worsening, new concerns or you are not improving with treatment.     Colin Benton R., DO

## 2016-07-26 DIAGNOSIS — G4733 Obstructive sleep apnea (adult) (pediatric): Secondary | ICD-10-CM | POA: Diagnosis not present

## 2016-07-26 DIAGNOSIS — I2609 Other pulmonary embolism with acute cor pulmonale: Secondary | ICD-10-CM | POA: Diagnosis not present

## 2016-07-26 DIAGNOSIS — E46 Unspecified protein-calorie malnutrition: Secondary | ICD-10-CM | POA: Diagnosis not present

## 2016-07-26 DIAGNOSIS — J449 Chronic obstructive pulmonary disease, unspecified: Secondary | ICD-10-CM | POA: Diagnosis not present

## 2016-07-26 DIAGNOSIS — G309 Alzheimer's disease, unspecified: Secondary | ICD-10-CM | POA: Diagnosis not present

## 2016-07-26 DIAGNOSIS — I679 Cerebrovascular disease, unspecified: Secondary | ICD-10-CM | POA: Diagnosis not present

## 2016-07-27 DIAGNOSIS — I2609 Other pulmonary embolism with acute cor pulmonale: Secondary | ICD-10-CM | POA: Diagnosis not present

## 2016-07-27 DIAGNOSIS — J449 Chronic obstructive pulmonary disease, unspecified: Secondary | ICD-10-CM | POA: Diagnosis not present

## 2016-07-27 DIAGNOSIS — G309 Alzheimer's disease, unspecified: Secondary | ICD-10-CM | POA: Diagnosis not present

## 2016-07-27 DIAGNOSIS — I679 Cerebrovascular disease, unspecified: Secondary | ICD-10-CM | POA: Diagnosis not present

## 2016-07-27 DIAGNOSIS — G4733 Obstructive sleep apnea (adult) (pediatric): Secondary | ICD-10-CM | POA: Diagnosis not present

## 2016-07-27 DIAGNOSIS — E46 Unspecified protein-calorie malnutrition: Secondary | ICD-10-CM | POA: Diagnosis not present

## 2016-07-28 ENCOUNTER — Telehealth: Payer: Self-pay | Admitting: Family Medicine

## 2016-07-28 DIAGNOSIS — E46 Unspecified protein-calorie malnutrition: Secondary | ICD-10-CM | POA: Diagnosis not present

## 2016-07-28 DIAGNOSIS — G309 Alzheimer's disease, unspecified: Secondary | ICD-10-CM | POA: Diagnosis not present

## 2016-07-28 DIAGNOSIS — G4733 Obstructive sleep apnea (adult) (pediatric): Secondary | ICD-10-CM | POA: Diagnosis not present

## 2016-07-28 DIAGNOSIS — I679 Cerebrovascular disease, unspecified: Secondary | ICD-10-CM | POA: Diagnosis not present

## 2016-07-28 DIAGNOSIS — J449 Chronic obstructive pulmonary disease, unspecified: Secondary | ICD-10-CM | POA: Diagnosis not present

## 2016-07-28 DIAGNOSIS — I2609 Other pulmonary embolism with acute cor pulmonale: Secondary | ICD-10-CM | POA: Diagnosis not present

## 2016-07-29 DIAGNOSIS — I2609 Other pulmonary embolism with acute cor pulmonale: Secondary | ICD-10-CM | POA: Diagnosis not present

## 2016-07-29 DIAGNOSIS — E46 Unspecified protein-calorie malnutrition: Secondary | ICD-10-CM | POA: Diagnosis not present

## 2016-07-29 DIAGNOSIS — I679 Cerebrovascular disease, unspecified: Secondary | ICD-10-CM | POA: Diagnosis not present

## 2016-07-29 DIAGNOSIS — G309 Alzheimer's disease, unspecified: Secondary | ICD-10-CM | POA: Diagnosis not present

## 2016-07-29 DIAGNOSIS — G4733 Obstructive sleep apnea (adult) (pediatric): Secondary | ICD-10-CM | POA: Diagnosis not present

## 2016-07-29 DIAGNOSIS — J449 Chronic obstructive pulmonary disease, unspecified: Secondary | ICD-10-CM | POA: Diagnosis not present

## 2016-07-29 NOTE — Progress Notes (Signed)
Spoke with daughter [name] [name] who confirmed that she was able to get medications. [name] [name] is at the [facility] house at [facility]. Denies needing anything at this time.

## 2016-07-29 NOTE — Telephone Encounter (Signed)
Error/ltd ° °

## 2016-07-30 DIAGNOSIS — E46 Unspecified protein-calorie malnutrition: Secondary | ICD-10-CM | POA: Diagnosis not present

## 2016-07-30 DIAGNOSIS — J449 Chronic obstructive pulmonary disease, unspecified: Secondary | ICD-10-CM | POA: Diagnosis not present

## 2016-07-30 DIAGNOSIS — G309 Alzheimer's disease, unspecified: Secondary | ICD-10-CM | POA: Diagnosis not present

## 2016-07-30 DIAGNOSIS — I679 Cerebrovascular disease, unspecified: Secondary | ICD-10-CM | POA: Diagnosis not present

## 2016-07-30 DIAGNOSIS — G4733 Obstructive sleep apnea (adult) (pediatric): Secondary | ICD-10-CM | POA: Diagnosis not present

## 2016-07-30 DIAGNOSIS — I2609 Other pulmonary embolism with acute cor pulmonale: Secondary | ICD-10-CM | POA: Diagnosis not present

## 2016-07-31 DIAGNOSIS — G4733 Obstructive sleep apnea (adult) (pediatric): Secondary | ICD-10-CM | POA: Diagnosis not present

## 2016-07-31 DIAGNOSIS — G309 Alzheimer's disease, unspecified: Secondary | ICD-10-CM | POA: Diagnosis not present

## 2016-07-31 DIAGNOSIS — E46 Unspecified protein-calorie malnutrition: Secondary | ICD-10-CM | POA: Diagnosis not present

## 2016-07-31 DIAGNOSIS — I2609 Other pulmonary embolism with acute cor pulmonale: Secondary | ICD-10-CM | POA: Diagnosis not present

## 2016-07-31 DIAGNOSIS — J449 Chronic obstructive pulmonary disease, unspecified: Secondary | ICD-10-CM | POA: Diagnosis not present

## 2016-07-31 DIAGNOSIS — I679 Cerebrovascular disease, unspecified: Secondary | ICD-10-CM | POA: Diagnosis not present

## 2016-08-01 DIAGNOSIS — I2609 Other pulmonary embolism with acute cor pulmonale: Secondary | ICD-10-CM | POA: Diagnosis not present

## 2016-08-01 DIAGNOSIS — J449 Chronic obstructive pulmonary disease, unspecified: Secondary | ICD-10-CM | POA: Diagnosis not present

## 2016-08-01 DIAGNOSIS — G309 Alzheimer's disease, unspecified: Secondary | ICD-10-CM | POA: Diagnosis not present

## 2016-08-01 DIAGNOSIS — G4733 Obstructive sleep apnea (adult) (pediatric): Secondary | ICD-10-CM | POA: Diagnosis not present

## 2016-08-01 DIAGNOSIS — E46 Unspecified protein-calorie malnutrition: Secondary | ICD-10-CM | POA: Diagnosis not present

## 2016-08-01 DIAGNOSIS — I679 Cerebrovascular disease, unspecified: Secondary | ICD-10-CM | POA: Diagnosis not present

## 2016-08-02 DIAGNOSIS — I679 Cerebrovascular disease, unspecified: Secondary | ICD-10-CM | POA: Diagnosis not present

## 2016-08-02 DIAGNOSIS — G309 Alzheimer's disease, unspecified: Secondary | ICD-10-CM | POA: Diagnosis not present

## 2016-08-02 DIAGNOSIS — E46 Unspecified protein-calorie malnutrition: Secondary | ICD-10-CM | POA: Diagnosis not present

## 2016-08-02 DIAGNOSIS — J449 Chronic obstructive pulmonary disease, unspecified: Secondary | ICD-10-CM | POA: Diagnosis not present

## 2016-08-02 DIAGNOSIS — G4733 Obstructive sleep apnea (adult) (pediatric): Secondary | ICD-10-CM | POA: Diagnosis not present

## 2016-08-02 DIAGNOSIS — I2609 Other pulmonary embolism with acute cor pulmonale: Secondary | ICD-10-CM | POA: Diagnosis not present

## 2016-08-03 DIAGNOSIS — I2609 Other pulmonary embolism with acute cor pulmonale: Secondary | ICD-10-CM | POA: Diagnosis not present

## 2016-08-03 DIAGNOSIS — G4733 Obstructive sleep apnea (adult) (pediatric): Secondary | ICD-10-CM | POA: Diagnosis not present

## 2016-08-03 DIAGNOSIS — I679 Cerebrovascular disease, unspecified: Secondary | ICD-10-CM | POA: Diagnosis not present

## 2016-08-03 DIAGNOSIS — E46 Unspecified protein-calorie malnutrition: Secondary | ICD-10-CM | POA: Diagnosis not present

## 2016-08-03 DIAGNOSIS — J449 Chronic obstructive pulmonary disease, unspecified: Secondary | ICD-10-CM | POA: Diagnosis not present

## 2016-08-03 DIAGNOSIS — G309 Alzheimer's disease, unspecified: Secondary | ICD-10-CM | POA: Diagnosis not present

## 2016-08-04 DIAGNOSIS — I2609 Other pulmonary embolism with acute cor pulmonale: Secondary | ICD-10-CM | POA: Diagnosis not present

## 2016-08-04 DIAGNOSIS — J449 Chronic obstructive pulmonary disease, unspecified: Secondary | ICD-10-CM | POA: Diagnosis not present

## 2016-08-04 DIAGNOSIS — I679 Cerebrovascular disease, unspecified: Secondary | ICD-10-CM | POA: Diagnosis not present

## 2016-08-04 DIAGNOSIS — G4733 Obstructive sleep apnea (adult) (pediatric): Secondary | ICD-10-CM | POA: Diagnosis not present

## 2016-08-04 DIAGNOSIS — E46 Unspecified protein-calorie malnutrition: Secondary | ICD-10-CM | POA: Diagnosis not present

## 2016-08-04 DIAGNOSIS — G309 Alzheimer's disease, unspecified: Secondary | ICD-10-CM | POA: Diagnosis not present

## 2016-08-05 DIAGNOSIS — I2609 Other pulmonary embolism with acute cor pulmonale: Secondary | ICD-10-CM | POA: Diagnosis not present

## 2016-08-05 DIAGNOSIS — J449 Chronic obstructive pulmonary disease, unspecified: Secondary | ICD-10-CM | POA: Diagnosis not present

## 2016-08-05 DIAGNOSIS — G309 Alzheimer's disease, unspecified: Secondary | ICD-10-CM | POA: Diagnosis not present

## 2016-08-05 DIAGNOSIS — E46 Unspecified protein-calorie malnutrition: Secondary | ICD-10-CM | POA: Diagnosis not present

## 2016-08-05 DIAGNOSIS — G4733 Obstructive sleep apnea (adult) (pediatric): Secondary | ICD-10-CM | POA: Diagnosis not present

## 2016-08-05 DIAGNOSIS — I679 Cerebrovascular disease, unspecified: Secondary | ICD-10-CM | POA: Diagnosis not present

## 2016-08-06 DIAGNOSIS — G4733 Obstructive sleep apnea (adult) (pediatric): Secondary | ICD-10-CM | POA: Diagnosis not present

## 2016-08-06 DIAGNOSIS — I679 Cerebrovascular disease, unspecified: Secondary | ICD-10-CM | POA: Diagnosis not present

## 2016-08-06 DIAGNOSIS — G309 Alzheimer's disease, unspecified: Secondary | ICD-10-CM | POA: Diagnosis not present

## 2016-08-06 DIAGNOSIS — I2609 Other pulmonary embolism with acute cor pulmonale: Secondary | ICD-10-CM | POA: Diagnosis not present

## 2016-08-06 DIAGNOSIS — E46 Unspecified protein-calorie malnutrition: Secondary | ICD-10-CM | POA: Diagnosis not present

## 2016-08-06 DIAGNOSIS — J449 Chronic obstructive pulmonary disease, unspecified: Secondary | ICD-10-CM | POA: Diagnosis not present

## 2016-08-07 DIAGNOSIS — G309 Alzheimer's disease, unspecified: Secondary | ICD-10-CM | POA: Diagnosis not present

## 2016-08-07 DIAGNOSIS — E46 Unspecified protein-calorie malnutrition: Secondary | ICD-10-CM | POA: Diagnosis not present

## 2016-08-07 DIAGNOSIS — I679 Cerebrovascular disease, unspecified: Secondary | ICD-10-CM | POA: Diagnosis not present

## 2016-08-07 DIAGNOSIS — J449 Chronic obstructive pulmonary disease, unspecified: Secondary | ICD-10-CM | POA: Diagnosis not present

## 2016-08-07 DIAGNOSIS — G4733 Obstructive sleep apnea (adult) (pediatric): Secondary | ICD-10-CM | POA: Diagnosis not present

## 2016-08-07 DIAGNOSIS — I2609 Other pulmonary embolism with acute cor pulmonale: Secondary | ICD-10-CM | POA: Diagnosis not present

## 2016-08-08 DIAGNOSIS — I2609 Other pulmonary embolism with acute cor pulmonale: Secondary | ICD-10-CM | POA: Diagnosis not present

## 2016-08-08 DIAGNOSIS — G309 Alzheimer's disease, unspecified: Secondary | ICD-10-CM | POA: Diagnosis not present

## 2016-08-08 DIAGNOSIS — G4733 Obstructive sleep apnea (adult) (pediatric): Secondary | ICD-10-CM | POA: Diagnosis not present

## 2016-08-08 DIAGNOSIS — E46 Unspecified protein-calorie malnutrition: Secondary | ICD-10-CM | POA: Diagnosis not present

## 2016-08-08 DIAGNOSIS — J449 Chronic obstructive pulmonary disease, unspecified: Secondary | ICD-10-CM | POA: Diagnosis not present

## 2016-08-08 DIAGNOSIS — I679 Cerebrovascular disease, unspecified: Secondary | ICD-10-CM | POA: Diagnosis not present

## 2016-08-09 DIAGNOSIS — I2609 Other pulmonary embolism with acute cor pulmonale: Secondary | ICD-10-CM | POA: Diagnosis not present

## 2016-08-09 DIAGNOSIS — G309 Alzheimer's disease, unspecified: Secondary | ICD-10-CM | POA: Diagnosis not present

## 2016-08-09 DIAGNOSIS — I679 Cerebrovascular disease, unspecified: Secondary | ICD-10-CM | POA: Diagnosis not present

## 2016-08-09 DIAGNOSIS — E46 Unspecified protein-calorie malnutrition: Secondary | ICD-10-CM | POA: Diagnosis not present

## 2016-08-09 DIAGNOSIS — G4733 Obstructive sleep apnea (adult) (pediatric): Secondary | ICD-10-CM | POA: Diagnosis not present

## 2016-08-09 DIAGNOSIS — J449 Chronic obstructive pulmonary disease, unspecified: Secondary | ICD-10-CM | POA: Diagnosis not present

## 2016-08-10 DIAGNOSIS — J309 Allergic rhinitis, unspecified: Secondary | ICD-10-CM | POA: Diagnosis not present

## 2016-08-10 DIAGNOSIS — G4733 Obstructive sleep apnea (adult) (pediatric): Secondary | ICD-10-CM | POA: Diagnosis not present

## 2016-08-10 DIAGNOSIS — I679 Cerebrovascular disease, unspecified: Secondary | ICD-10-CM | POA: Diagnosis not present

## 2016-08-10 DIAGNOSIS — E46 Unspecified protein-calorie malnutrition: Secondary | ICD-10-CM | POA: Diagnosis not present

## 2016-08-10 DIAGNOSIS — I2609 Other pulmonary embolism with acute cor pulmonale: Secondary | ICD-10-CM | POA: Diagnosis not present

## 2016-08-10 DIAGNOSIS — J449 Chronic obstructive pulmonary disease, unspecified: Secondary | ICD-10-CM | POA: Diagnosis not present

## 2016-08-10 DIAGNOSIS — N390 Urinary tract infection, site not specified: Secondary | ICD-10-CM | POA: Diagnosis not present

## 2016-08-10 DIAGNOSIS — G309 Alzheimer's disease, unspecified: Secondary | ICD-10-CM | POA: Diagnosis not present

## 2016-08-10 DIAGNOSIS — I10 Essential (primary) hypertension: Secondary | ICD-10-CM | POA: Diagnosis not present

## 2016-08-10 DIAGNOSIS — I4891 Unspecified atrial fibrillation: Secondary | ICD-10-CM | POA: Diagnosis not present

## 2016-08-11 DIAGNOSIS — I2609 Other pulmonary embolism with acute cor pulmonale: Secondary | ICD-10-CM | POA: Diagnosis not present

## 2016-08-11 DIAGNOSIS — G4733 Obstructive sleep apnea (adult) (pediatric): Secondary | ICD-10-CM | POA: Diagnosis not present

## 2016-08-11 DIAGNOSIS — I679 Cerebrovascular disease, unspecified: Secondary | ICD-10-CM | POA: Diagnosis not present

## 2016-08-11 DIAGNOSIS — J449 Chronic obstructive pulmonary disease, unspecified: Secondary | ICD-10-CM | POA: Diagnosis not present

## 2016-08-11 DIAGNOSIS — G309 Alzheimer's disease, unspecified: Secondary | ICD-10-CM | POA: Diagnosis not present

## 2016-08-11 DIAGNOSIS — E46 Unspecified protein-calorie malnutrition: Secondary | ICD-10-CM | POA: Diagnosis not present

## 2016-08-12 DIAGNOSIS — I679 Cerebrovascular disease, unspecified: Secondary | ICD-10-CM | POA: Diagnosis not present

## 2016-08-12 DIAGNOSIS — G309 Alzheimer's disease, unspecified: Secondary | ICD-10-CM | POA: Diagnosis not present

## 2016-08-12 DIAGNOSIS — J449 Chronic obstructive pulmonary disease, unspecified: Secondary | ICD-10-CM | POA: Diagnosis not present

## 2016-08-12 DIAGNOSIS — I2609 Other pulmonary embolism with acute cor pulmonale: Secondary | ICD-10-CM | POA: Diagnosis not present

## 2016-08-12 DIAGNOSIS — G4733 Obstructive sleep apnea (adult) (pediatric): Secondary | ICD-10-CM | POA: Diagnosis not present

## 2016-08-12 DIAGNOSIS — E46 Unspecified protein-calorie malnutrition: Secondary | ICD-10-CM | POA: Diagnosis not present

## 2016-08-13 DIAGNOSIS — I679 Cerebrovascular disease, unspecified: Secondary | ICD-10-CM | POA: Diagnosis not present

## 2016-08-13 DIAGNOSIS — J449 Chronic obstructive pulmonary disease, unspecified: Secondary | ICD-10-CM | POA: Diagnosis not present

## 2016-08-13 DIAGNOSIS — G309 Alzheimer's disease, unspecified: Secondary | ICD-10-CM | POA: Diagnosis not present

## 2016-08-13 DIAGNOSIS — E46 Unspecified protein-calorie malnutrition: Secondary | ICD-10-CM | POA: Diagnosis not present

## 2016-08-13 DIAGNOSIS — I2609 Other pulmonary embolism with acute cor pulmonale: Secondary | ICD-10-CM | POA: Diagnosis not present

## 2016-08-13 DIAGNOSIS — G4733 Obstructive sleep apnea (adult) (pediatric): Secondary | ICD-10-CM | POA: Diagnosis not present

## 2016-08-14 DIAGNOSIS — I2609 Other pulmonary embolism with acute cor pulmonale: Secondary | ICD-10-CM | POA: Diagnosis not present

## 2016-08-14 DIAGNOSIS — E46 Unspecified protein-calorie malnutrition: Secondary | ICD-10-CM | POA: Diagnosis not present

## 2016-08-14 DIAGNOSIS — G4733 Obstructive sleep apnea (adult) (pediatric): Secondary | ICD-10-CM | POA: Diagnosis not present

## 2016-08-14 DIAGNOSIS — G309 Alzheimer's disease, unspecified: Secondary | ICD-10-CM | POA: Diagnosis not present

## 2016-08-14 DIAGNOSIS — I679 Cerebrovascular disease, unspecified: Secondary | ICD-10-CM | POA: Diagnosis not present

## 2016-08-14 DIAGNOSIS — J449 Chronic obstructive pulmonary disease, unspecified: Secondary | ICD-10-CM | POA: Diagnosis not present

## 2016-08-15 DIAGNOSIS — J449 Chronic obstructive pulmonary disease, unspecified: Secondary | ICD-10-CM | POA: Diagnosis not present

## 2016-08-15 DIAGNOSIS — I679 Cerebrovascular disease, unspecified: Secondary | ICD-10-CM | POA: Diagnosis not present

## 2016-08-15 DIAGNOSIS — G4733 Obstructive sleep apnea (adult) (pediatric): Secondary | ICD-10-CM | POA: Diagnosis not present

## 2016-08-15 DIAGNOSIS — G309 Alzheimer's disease, unspecified: Secondary | ICD-10-CM | POA: Diagnosis not present

## 2016-08-15 DIAGNOSIS — E46 Unspecified protein-calorie malnutrition: Secondary | ICD-10-CM | POA: Diagnosis not present

## 2016-08-15 DIAGNOSIS — I2609 Other pulmonary embolism with acute cor pulmonale: Secondary | ICD-10-CM | POA: Diagnosis not present

## 2016-08-16 DIAGNOSIS — E46 Unspecified protein-calorie malnutrition: Secondary | ICD-10-CM | POA: Diagnosis not present

## 2016-08-16 DIAGNOSIS — I679 Cerebrovascular disease, unspecified: Secondary | ICD-10-CM | POA: Diagnosis not present

## 2016-08-16 DIAGNOSIS — G309 Alzheimer's disease, unspecified: Secondary | ICD-10-CM | POA: Diagnosis not present

## 2016-08-16 DIAGNOSIS — G4733 Obstructive sleep apnea (adult) (pediatric): Secondary | ICD-10-CM | POA: Diagnosis not present

## 2016-08-16 DIAGNOSIS — I2609 Other pulmonary embolism with acute cor pulmonale: Secondary | ICD-10-CM | POA: Diagnosis not present

## 2016-08-16 DIAGNOSIS — J449 Chronic obstructive pulmonary disease, unspecified: Secondary | ICD-10-CM | POA: Diagnosis not present

## 2016-08-17 DIAGNOSIS — E46 Unspecified protein-calorie malnutrition: Secondary | ICD-10-CM | POA: Diagnosis not present

## 2016-08-17 DIAGNOSIS — I2609 Other pulmonary embolism with acute cor pulmonale: Secondary | ICD-10-CM | POA: Diagnosis not present

## 2016-08-17 DIAGNOSIS — J449 Chronic obstructive pulmonary disease, unspecified: Secondary | ICD-10-CM | POA: Diagnosis not present

## 2016-08-17 DIAGNOSIS — I679 Cerebrovascular disease, unspecified: Secondary | ICD-10-CM | POA: Diagnosis not present

## 2016-08-17 DIAGNOSIS — G4733 Obstructive sleep apnea (adult) (pediatric): Secondary | ICD-10-CM | POA: Diagnosis not present

## 2016-08-17 DIAGNOSIS — G309 Alzheimer's disease, unspecified: Secondary | ICD-10-CM | POA: Diagnosis not present

## 2016-08-18 DIAGNOSIS — J449 Chronic obstructive pulmonary disease, unspecified: Secondary | ICD-10-CM | POA: Diagnosis not present

## 2016-08-18 DIAGNOSIS — I679 Cerebrovascular disease, unspecified: Secondary | ICD-10-CM | POA: Diagnosis not present

## 2016-08-18 DIAGNOSIS — E46 Unspecified protein-calorie malnutrition: Secondary | ICD-10-CM | POA: Diagnosis not present

## 2016-08-18 DIAGNOSIS — G309 Alzheimer's disease, unspecified: Secondary | ICD-10-CM | POA: Diagnosis not present

## 2016-08-18 DIAGNOSIS — I2609 Other pulmonary embolism with acute cor pulmonale: Secondary | ICD-10-CM | POA: Diagnosis not present

## 2016-08-18 DIAGNOSIS — G4733 Obstructive sleep apnea (adult) (pediatric): Secondary | ICD-10-CM | POA: Diagnosis not present

## 2016-08-19 DIAGNOSIS — J449 Chronic obstructive pulmonary disease, unspecified: Secondary | ICD-10-CM | POA: Diagnosis not present

## 2016-08-19 DIAGNOSIS — G309 Alzheimer's disease, unspecified: Secondary | ICD-10-CM | POA: Diagnosis not present

## 2016-08-19 DIAGNOSIS — E46 Unspecified protein-calorie malnutrition: Secondary | ICD-10-CM | POA: Diagnosis not present

## 2016-08-19 DIAGNOSIS — I679 Cerebrovascular disease, unspecified: Secondary | ICD-10-CM | POA: Diagnosis not present

## 2016-08-19 DIAGNOSIS — G4733 Obstructive sleep apnea (adult) (pediatric): Secondary | ICD-10-CM | POA: Diagnosis not present

## 2016-08-19 DIAGNOSIS — I2609 Other pulmonary embolism with acute cor pulmonale: Secondary | ICD-10-CM | POA: Diagnosis not present

## 2016-08-20 DIAGNOSIS — G309 Alzheimer's disease, unspecified: Secondary | ICD-10-CM | POA: Diagnosis not present

## 2016-08-20 DIAGNOSIS — I2609 Other pulmonary embolism with acute cor pulmonale: Secondary | ICD-10-CM | POA: Diagnosis not present

## 2016-08-20 DIAGNOSIS — I679 Cerebrovascular disease, unspecified: Secondary | ICD-10-CM | POA: Diagnosis not present

## 2016-08-20 DIAGNOSIS — G4733 Obstructive sleep apnea (adult) (pediatric): Secondary | ICD-10-CM | POA: Diagnosis not present

## 2016-08-20 DIAGNOSIS — J449 Chronic obstructive pulmonary disease, unspecified: Secondary | ICD-10-CM | POA: Diagnosis not present

## 2016-08-20 DIAGNOSIS — E46 Unspecified protein-calorie malnutrition: Secondary | ICD-10-CM | POA: Diagnosis not present

## 2016-08-21 DIAGNOSIS — I2609 Other pulmonary embolism with acute cor pulmonale: Secondary | ICD-10-CM | POA: Diagnosis not present

## 2016-08-21 DIAGNOSIS — G309 Alzheimer's disease, unspecified: Secondary | ICD-10-CM | POA: Diagnosis not present

## 2016-08-21 DIAGNOSIS — I679 Cerebrovascular disease, unspecified: Secondary | ICD-10-CM | POA: Diagnosis not present

## 2016-08-21 DIAGNOSIS — G4733 Obstructive sleep apnea (adult) (pediatric): Secondary | ICD-10-CM | POA: Diagnosis not present

## 2016-08-21 DIAGNOSIS — E46 Unspecified protein-calorie malnutrition: Secondary | ICD-10-CM | POA: Diagnosis not present

## 2016-08-21 DIAGNOSIS — J449 Chronic obstructive pulmonary disease, unspecified: Secondary | ICD-10-CM | POA: Diagnosis not present

## 2016-08-22 DIAGNOSIS — G309 Alzheimer's disease, unspecified: Secondary | ICD-10-CM | POA: Diagnosis not present

## 2016-08-22 DIAGNOSIS — E46 Unspecified protein-calorie malnutrition: Secondary | ICD-10-CM | POA: Diagnosis not present

## 2016-08-22 DIAGNOSIS — J449 Chronic obstructive pulmonary disease, unspecified: Secondary | ICD-10-CM | POA: Diagnosis not present

## 2016-08-22 DIAGNOSIS — I679 Cerebrovascular disease, unspecified: Secondary | ICD-10-CM | POA: Diagnosis not present

## 2016-08-22 DIAGNOSIS — G4733 Obstructive sleep apnea (adult) (pediatric): Secondary | ICD-10-CM | POA: Diagnosis not present

## 2016-08-22 DIAGNOSIS — I2609 Other pulmonary embolism with acute cor pulmonale: Secondary | ICD-10-CM | POA: Diagnosis not present

## 2016-08-25 DIAGNOSIS — F419 Anxiety disorder, unspecified: Secondary | ICD-10-CM | POA: Diagnosis not present

## 2016-08-25 DIAGNOSIS — R627 Adult failure to thrive: Secondary | ICD-10-CM | POA: Diagnosis not present

## 2016-08-25 DIAGNOSIS — G4730 Sleep apnea, unspecified: Secondary | ICD-10-CM | POA: Diagnosis not present

## 2016-08-25 DIAGNOSIS — F0390 Unspecified dementia without behavioral disturbance: Secondary | ICD-10-CM | POA: Diagnosis not present

## 2016-08-25 DIAGNOSIS — E440 Moderate protein-calorie malnutrition: Secondary | ICD-10-CM | POA: Diagnosis not present

## 2016-08-28 DIAGNOSIS — I2609 Other pulmonary embolism with acute cor pulmonale: Secondary | ICD-10-CM | POA: Diagnosis not present

## 2016-08-28 DIAGNOSIS — E46 Unspecified protein-calorie malnutrition: Secondary | ICD-10-CM | POA: Diagnosis not present

## 2016-08-28 DIAGNOSIS — G4733 Obstructive sleep apnea (adult) (pediatric): Secondary | ICD-10-CM | POA: Diagnosis not present

## 2016-08-28 DIAGNOSIS — G309 Alzheimer's disease, unspecified: Secondary | ICD-10-CM | POA: Diagnosis not present

## 2016-08-28 DIAGNOSIS — I679 Cerebrovascular disease, unspecified: Secondary | ICD-10-CM | POA: Diagnosis not present

## 2016-08-28 DIAGNOSIS — J449 Chronic obstructive pulmonary disease, unspecified: Secondary | ICD-10-CM | POA: Diagnosis not present

## 2016-09-02 DIAGNOSIS — G4733 Obstructive sleep apnea (adult) (pediatric): Secondary | ICD-10-CM | POA: Diagnosis not present

## 2016-09-02 DIAGNOSIS — J449 Chronic obstructive pulmonary disease, unspecified: Secondary | ICD-10-CM | POA: Diagnosis not present

## 2016-09-02 DIAGNOSIS — G309 Alzheimer's disease, unspecified: Secondary | ICD-10-CM | POA: Diagnosis not present

## 2016-09-02 DIAGNOSIS — I2609 Other pulmonary embolism with acute cor pulmonale: Secondary | ICD-10-CM | POA: Diagnosis not present

## 2016-09-02 DIAGNOSIS — I679 Cerebrovascular disease, unspecified: Secondary | ICD-10-CM | POA: Diagnosis not present

## 2016-09-02 DIAGNOSIS — E46 Unspecified protein-calorie malnutrition: Secondary | ICD-10-CM | POA: Diagnosis not present

## 2016-09-04 DIAGNOSIS — G309 Alzheimer's disease, unspecified: Secondary | ICD-10-CM | POA: Diagnosis not present

## 2016-09-04 DIAGNOSIS — E46 Unspecified protein-calorie malnutrition: Secondary | ICD-10-CM | POA: Diagnosis not present

## 2016-09-04 DIAGNOSIS — I679 Cerebrovascular disease, unspecified: Secondary | ICD-10-CM | POA: Diagnosis not present

## 2016-09-04 DIAGNOSIS — J449 Chronic obstructive pulmonary disease, unspecified: Secondary | ICD-10-CM | POA: Diagnosis not present

## 2016-09-04 DIAGNOSIS — G4733 Obstructive sleep apnea (adult) (pediatric): Secondary | ICD-10-CM | POA: Diagnosis not present

## 2016-09-04 DIAGNOSIS — I2609 Other pulmonary embolism with acute cor pulmonale: Secondary | ICD-10-CM | POA: Diagnosis not present

## 2016-09-09 DIAGNOSIS — N390 Urinary tract infection, site not specified: Secondary | ICD-10-CM | POA: Diagnosis not present

## 2016-09-09 DIAGNOSIS — J449 Chronic obstructive pulmonary disease, unspecified: Secondary | ICD-10-CM | POA: Diagnosis not present

## 2016-09-09 DIAGNOSIS — E46 Unspecified protein-calorie malnutrition: Secondary | ICD-10-CM | POA: Diagnosis not present

## 2016-09-09 DIAGNOSIS — I679 Cerebrovascular disease, unspecified: Secondary | ICD-10-CM | POA: Diagnosis not present

## 2016-09-09 DIAGNOSIS — I2609 Other pulmonary embolism with acute cor pulmonale: Secondary | ICD-10-CM | POA: Diagnosis not present

## 2016-09-09 DIAGNOSIS — J309 Allergic rhinitis, unspecified: Secondary | ICD-10-CM | POA: Diagnosis not present

## 2016-09-09 DIAGNOSIS — I10 Essential (primary) hypertension: Secondary | ICD-10-CM | POA: Diagnosis not present

## 2016-09-09 DIAGNOSIS — G4733 Obstructive sleep apnea (adult) (pediatric): Secondary | ICD-10-CM | POA: Diagnosis not present

## 2016-09-09 DIAGNOSIS — G309 Alzheimer's disease, unspecified: Secondary | ICD-10-CM | POA: Diagnosis not present

## 2016-09-09 DIAGNOSIS — I4891 Unspecified atrial fibrillation: Secondary | ICD-10-CM | POA: Diagnosis not present

## 2016-09-10 DIAGNOSIS — I679 Cerebrovascular disease, unspecified: Secondary | ICD-10-CM | POA: Diagnosis not present

## 2016-09-10 DIAGNOSIS — E46 Unspecified protein-calorie malnutrition: Secondary | ICD-10-CM | POA: Diagnosis not present

## 2016-09-10 DIAGNOSIS — G309 Alzheimer's disease, unspecified: Secondary | ICD-10-CM | POA: Diagnosis not present

## 2016-09-10 DIAGNOSIS — J449 Chronic obstructive pulmonary disease, unspecified: Secondary | ICD-10-CM | POA: Diagnosis not present

## 2016-09-10 DIAGNOSIS — I2609 Other pulmonary embolism with acute cor pulmonale: Secondary | ICD-10-CM | POA: Diagnosis not present

## 2016-09-10 DIAGNOSIS — G4733 Obstructive sleep apnea (adult) (pediatric): Secondary | ICD-10-CM | POA: Diagnosis not present

## 2016-09-10 DIAGNOSIS — R627 Adult failure to thrive: Secondary | ICD-10-CM | POA: Diagnosis not present

## 2016-09-10 DIAGNOSIS — F419 Anxiety disorder, unspecified: Secondary | ICD-10-CM | POA: Diagnosis not present

## 2016-09-12 DIAGNOSIS — J449 Chronic obstructive pulmonary disease, unspecified: Secondary | ICD-10-CM | POA: Diagnosis not present

## 2016-09-12 DIAGNOSIS — G4733 Obstructive sleep apnea (adult) (pediatric): Secondary | ICD-10-CM | POA: Diagnosis not present

## 2016-09-12 DIAGNOSIS — E46 Unspecified protein-calorie malnutrition: Secondary | ICD-10-CM | POA: Diagnosis not present

## 2016-09-12 DIAGNOSIS — I2609 Other pulmonary embolism with acute cor pulmonale: Secondary | ICD-10-CM | POA: Diagnosis not present

## 2016-09-12 DIAGNOSIS — I679 Cerebrovascular disease, unspecified: Secondary | ICD-10-CM | POA: Diagnosis not present

## 2016-09-12 DIAGNOSIS — G309 Alzheimer's disease, unspecified: Secondary | ICD-10-CM | POA: Diagnosis not present

## 2016-09-16 DIAGNOSIS — G4733 Obstructive sleep apnea (adult) (pediatric): Secondary | ICD-10-CM | POA: Diagnosis not present

## 2016-09-16 DIAGNOSIS — G309 Alzheimer's disease, unspecified: Secondary | ICD-10-CM | POA: Diagnosis not present

## 2016-09-16 DIAGNOSIS — I2609 Other pulmonary embolism with acute cor pulmonale: Secondary | ICD-10-CM | POA: Diagnosis not present

## 2016-09-16 DIAGNOSIS — J449 Chronic obstructive pulmonary disease, unspecified: Secondary | ICD-10-CM | POA: Diagnosis not present

## 2016-09-16 DIAGNOSIS — I679 Cerebrovascular disease, unspecified: Secondary | ICD-10-CM | POA: Diagnosis not present

## 2016-09-16 DIAGNOSIS — E46 Unspecified protein-calorie malnutrition: Secondary | ICD-10-CM | POA: Diagnosis not present

## 2016-09-17 DIAGNOSIS — I679 Cerebrovascular disease, unspecified: Secondary | ICD-10-CM | POA: Diagnosis not present

## 2016-09-17 DIAGNOSIS — I2609 Other pulmonary embolism with acute cor pulmonale: Secondary | ICD-10-CM | POA: Diagnosis not present

## 2016-09-17 DIAGNOSIS — E46 Unspecified protein-calorie malnutrition: Secondary | ICD-10-CM | POA: Diagnosis not present

## 2016-09-17 DIAGNOSIS — J449 Chronic obstructive pulmonary disease, unspecified: Secondary | ICD-10-CM | POA: Diagnosis not present

## 2016-09-17 DIAGNOSIS — G4733 Obstructive sleep apnea (adult) (pediatric): Secondary | ICD-10-CM | POA: Diagnosis not present

## 2016-09-17 DIAGNOSIS — G309 Alzheimer's disease, unspecified: Secondary | ICD-10-CM | POA: Diagnosis not present

## 2016-09-18 DIAGNOSIS — I679 Cerebrovascular disease, unspecified: Secondary | ICD-10-CM | POA: Diagnosis not present

## 2016-09-18 DIAGNOSIS — G309 Alzheimer's disease, unspecified: Secondary | ICD-10-CM | POA: Diagnosis not present

## 2016-09-18 DIAGNOSIS — E46 Unspecified protein-calorie malnutrition: Secondary | ICD-10-CM | POA: Diagnosis not present

## 2016-09-18 DIAGNOSIS — I2609 Other pulmonary embolism with acute cor pulmonale: Secondary | ICD-10-CM | POA: Diagnosis not present

## 2016-09-18 DIAGNOSIS — G4733 Obstructive sleep apnea (adult) (pediatric): Secondary | ICD-10-CM | POA: Diagnosis not present

## 2016-09-18 DIAGNOSIS — J449 Chronic obstructive pulmonary disease, unspecified: Secondary | ICD-10-CM | POA: Diagnosis not present

## 2016-09-19 DIAGNOSIS — I2609 Other pulmonary embolism with acute cor pulmonale: Secondary | ICD-10-CM | POA: Diagnosis not present

## 2016-09-19 DIAGNOSIS — J449 Chronic obstructive pulmonary disease, unspecified: Secondary | ICD-10-CM | POA: Diagnosis not present

## 2016-09-19 DIAGNOSIS — G309 Alzheimer's disease, unspecified: Secondary | ICD-10-CM | POA: Diagnosis not present

## 2016-09-19 DIAGNOSIS — I679 Cerebrovascular disease, unspecified: Secondary | ICD-10-CM | POA: Diagnosis not present

## 2016-09-19 DIAGNOSIS — G4733 Obstructive sleep apnea (adult) (pediatric): Secondary | ICD-10-CM | POA: Diagnosis not present

## 2016-09-19 DIAGNOSIS — E46 Unspecified protein-calorie malnutrition: Secondary | ICD-10-CM | POA: Diagnosis not present

## 2016-09-22 DIAGNOSIS — E46 Unspecified protein-calorie malnutrition: Secondary | ICD-10-CM | POA: Diagnosis not present

## 2016-09-22 DIAGNOSIS — G309 Alzheimer's disease, unspecified: Secondary | ICD-10-CM | POA: Diagnosis not present

## 2016-09-22 DIAGNOSIS — I2609 Other pulmonary embolism with acute cor pulmonale: Secondary | ICD-10-CM | POA: Diagnosis not present

## 2016-09-22 DIAGNOSIS — I679 Cerebrovascular disease, unspecified: Secondary | ICD-10-CM | POA: Diagnosis not present

## 2016-09-22 DIAGNOSIS — G4733 Obstructive sleep apnea (adult) (pediatric): Secondary | ICD-10-CM | POA: Diagnosis not present

## 2016-09-22 DIAGNOSIS — J449 Chronic obstructive pulmonary disease, unspecified: Secondary | ICD-10-CM | POA: Diagnosis not present

## 2016-09-23 DIAGNOSIS — J449 Chronic obstructive pulmonary disease, unspecified: Secondary | ICD-10-CM | POA: Diagnosis not present

## 2016-09-23 DIAGNOSIS — I2609 Other pulmonary embolism with acute cor pulmonale: Secondary | ICD-10-CM | POA: Diagnosis not present

## 2016-09-23 DIAGNOSIS — G309 Alzheimer's disease, unspecified: Secondary | ICD-10-CM | POA: Diagnosis not present

## 2016-09-23 DIAGNOSIS — G4733 Obstructive sleep apnea (adult) (pediatric): Secondary | ICD-10-CM | POA: Diagnosis not present

## 2016-09-23 DIAGNOSIS — E46 Unspecified protein-calorie malnutrition: Secondary | ICD-10-CM | POA: Diagnosis not present

## 2016-09-23 DIAGNOSIS — I679 Cerebrovascular disease, unspecified: Secondary | ICD-10-CM | POA: Diagnosis not present

## 2016-09-24 DIAGNOSIS — E46 Unspecified protein-calorie malnutrition: Secondary | ICD-10-CM | POA: Diagnosis not present

## 2016-09-24 DIAGNOSIS — G4733 Obstructive sleep apnea (adult) (pediatric): Secondary | ICD-10-CM | POA: Diagnosis not present

## 2016-09-24 DIAGNOSIS — Z8673 Personal history of transient ischemic attack (TIA), and cerebral infarction without residual deficits: Secondary | ICD-10-CM | POA: Diagnosis not present

## 2016-09-24 DIAGNOSIS — I2609 Other pulmonary embolism with acute cor pulmonale: Secondary | ICD-10-CM | POA: Diagnosis not present

## 2016-09-24 DIAGNOSIS — J449 Chronic obstructive pulmonary disease, unspecified: Secondary | ICD-10-CM | POA: Diagnosis not present

## 2016-09-24 DIAGNOSIS — G309 Alzheimer's disease, unspecified: Secondary | ICD-10-CM | POA: Diagnosis not present

## 2016-09-24 DIAGNOSIS — I679 Cerebrovascular disease, unspecified: Secondary | ICD-10-CM | POA: Diagnosis not present

## 2016-09-26 DIAGNOSIS — G4733 Obstructive sleep apnea (adult) (pediatric): Secondary | ICD-10-CM | POA: Diagnosis not present

## 2016-09-26 DIAGNOSIS — I679 Cerebrovascular disease, unspecified: Secondary | ICD-10-CM | POA: Diagnosis not present

## 2016-09-26 DIAGNOSIS — G309 Alzheimer's disease, unspecified: Secondary | ICD-10-CM | POA: Diagnosis not present

## 2016-09-26 DIAGNOSIS — I2609 Other pulmonary embolism with acute cor pulmonale: Secondary | ICD-10-CM | POA: Diagnosis not present

## 2016-09-26 DIAGNOSIS — J449 Chronic obstructive pulmonary disease, unspecified: Secondary | ICD-10-CM | POA: Diagnosis not present

## 2016-09-26 DIAGNOSIS — E46 Unspecified protein-calorie malnutrition: Secondary | ICD-10-CM | POA: Diagnosis not present

## 2016-09-30 DIAGNOSIS — E46 Unspecified protein-calorie malnutrition: Secondary | ICD-10-CM | POA: Diagnosis not present

## 2016-09-30 DIAGNOSIS — I679 Cerebrovascular disease, unspecified: Secondary | ICD-10-CM | POA: Diagnosis not present

## 2016-09-30 DIAGNOSIS — J449 Chronic obstructive pulmonary disease, unspecified: Secondary | ICD-10-CM | POA: Diagnosis not present

## 2016-09-30 DIAGNOSIS — G309 Alzheimer's disease, unspecified: Secondary | ICD-10-CM | POA: Diagnosis not present

## 2016-09-30 DIAGNOSIS — I2609 Other pulmonary embolism with acute cor pulmonale: Secondary | ICD-10-CM | POA: Diagnosis not present

## 2016-09-30 DIAGNOSIS — G4733 Obstructive sleep apnea (adult) (pediatric): Secondary | ICD-10-CM | POA: Diagnosis not present

## 2016-10-09 DIAGNOSIS — G4733 Obstructive sleep apnea (adult) (pediatric): Secondary | ICD-10-CM | POA: Diagnosis not present

## 2016-10-09 DIAGNOSIS — I2609 Other pulmonary embolism with acute cor pulmonale: Secondary | ICD-10-CM | POA: Diagnosis not present

## 2016-10-09 DIAGNOSIS — E46 Unspecified protein-calorie malnutrition: Secondary | ICD-10-CM | POA: Diagnosis not present

## 2016-10-09 DIAGNOSIS — G309 Alzheimer's disease, unspecified: Secondary | ICD-10-CM | POA: Diagnosis not present

## 2016-10-09 DIAGNOSIS — J449 Chronic obstructive pulmonary disease, unspecified: Secondary | ICD-10-CM | POA: Diagnosis not present

## 2016-10-09 DIAGNOSIS — I679 Cerebrovascular disease, unspecified: Secondary | ICD-10-CM | POA: Diagnosis not present

## 2016-10-10 DIAGNOSIS — J449 Chronic obstructive pulmonary disease, unspecified: Secondary | ICD-10-CM | POA: Diagnosis not present

## 2016-10-10 DIAGNOSIS — I4891 Unspecified atrial fibrillation: Secondary | ICD-10-CM | POA: Diagnosis not present

## 2016-10-10 DIAGNOSIS — E46 Unspecified protein-calorie malnutrition: Secondary | ICD-10-CM | POA: Diagnosis not present

## 2016-10-10 DIAGNOSIS — J309 Allergic rhinitis, unspecified: Secondary | ICD-10-CM | POA: Diagnosis not present

## 2016-10-10 DIAGNOSIS — G4733 Obstructive sleep apnea (adult) (pediatric): Secondary | ICD-10-CM | POA: Diagnosis not present

## 2016-10-10 DIAGNOSIS — I2609 Other pulmonary embolism with acute cor pulmonale: Secondary | ICD-10-CM | POA: Diagnosis not present

## 2016-10-10 DIAGNOSIS — G309 Alzheimer's disease, unspecified: Secondary | ICD-10-CM | POA: Diagnosis not present

## 2016-10-10 DIAGNOSIS — I10 Essential (primary) hypertension: Secondary | ICD-10-CM | POA: Diagnosis not present

## 2016-10-10 DIAGNOSIS — N390 Urinary tract infection, site not specified: Secondary | ICD-10-CM | POA: Diagnosis not present

## 2016-10-10 DIAGNOSIS — I679 Cerebrovascular disease, unspecified: Secondary | ICD-10-CM | POA: Diagnosis not present

## 2016-10-13 DIAGNOSIS — G309 Alzheimer's disease, unspecified: Secondary | ICD-10-CM | POA: Diagnosis not present

## 2016-10-13 DIAGNOSIS — G4733 Obstructive sleep apnea (adult) (pediatric): Secondary | ICD-10-CM | POA: Diagnosis not present

## 2016-10-13 DIAGNOSIS — I2609 Other pulmonary embolism with acute cor pulmonale: Secondary | ICD-10-CM | POA: Diagnosis not present

## 2016-10-13 DIAGNOSIS — E46 Unspecified protein-calorie malnutrition: Secondary | ICD-10-CM | POA: Diagnosis not present

## 2016-10-13 DIAGNOSIS — I679 Cerebrovascular disease, unspecified: Secondary | ICD-10-CM | POA: Diagnosis not present

## 2016-10-13 DIAGNOSIS — J449 Chronic obstructive pulmonary disease, unspecified: Secondary | ICD-10-CM | POA: Diagnosis not present

## 2016-10-15 DIAGNOSIS — J449 Chronic obstructive pulmonary disease, unspecified: Secondary | ICD-10-CM | POA: Diagnosis not present

## 2016-10-15 DIAGNOSIS — G309 Alzheimer's disease, unspecified: Secondary | ICD-10-CM | POA: Diagnosis not present

## 2016-10-15 DIAGNOSIS — G4733 Obstructive sleep apnea (adult) (pediatric): Secondary | ICD-10-CM | POA: Diagnosis not present

## 2016-10-15 DIAGNOSIS — E46 Unspecified protein-calorie malnutrition: Secondary | ICD-10-CM | POA: Diagnosis not present

## 2016-10-15 DIAGNOSIS — I2609 Other pulmonary embolism with acute cor pulmonale: Secondary | ICD-10-CM | POA: Diagnosis not present

## 2016-10-15 DIAGNOSIS — I679 Cerebrovascular disease, unspecified: Secondary | ICD-10-CM | POA: Diagnosis not present

## 2016-10-24 DIAGNOSIS — I2609 Other pulmonary embolism with acute cor pulmonale: Secondary | ICD-10-CM | POA: Diagnosis not present

## 2016-10-24 DIAGNOSIS — G4733 Obstructive sleep apnea (adult) (pediatric): Secondary | ICD-10-CM | POA: Diagnosis not present

## 2016-10-24 DIAGNOSIS — J449 Chronic obstructive pulmonary disease, unspecified: Secondary | ICD-10-CM | POA: Diagnosis not present

## 2016-10-24 DIAGNOSIS — E46 Unspecified protein-calorie malnutrition: Secondary | ICD-10-CM | POA: Diagnosis not present

## 2016-10-24 DIAGNOSIS — I679 Cerebrovascular disease, unspecified: Secondary | ICD-10-CM | POA: Diagnosis not present

## 2016-10-24 DIAGNOSIS — G309 Alzheimer's disease, unspecified: Secondary | ICD-10-CM | POA: Diagnosis not present

## 2016-10-30 DIAGNOSIS — I679 Cerebrovascular disease, unspecified: Secondary | ICD-10-CM | POA: Diagnosis not present

## 2016-10-30 DIAGNOSIS — E46 Unspecified protein-calorie malnutrition: Secondary | ICD-10-CM | POA: Diagnosis not present

## 2016-10-30 DIAGNOSIS — G4733 Obstructive sleep apnea (adult) (pediatric): Secondary | ICD-10-CM | POA: Diagnosis not present

## 2016-10-30 DIAGNOSIS — G309 Alzheimer's disease, unspecified: Secondary | ICD-10-CM | POA: Diagnosis not present

## 2016-10-30 DIAGNOSIS — J449 Chronic obstructive pulmonary disease, unspecified: Secondary | ICD-10-CM | POA: Diagnosis not present

## 2016-10-30 DIAGNOSIS — I2609 Other pulmonary embolism with acute cor pulmonale: Secondary | ICD-10-CM | POA: Diagnosis not present

## 2016-11-04 DIAGNOSIS — G309 Alzheimer's disease, unspecified: Secondary | ICD-10-CM | POA: Diagnosis not present

## 2016-11-04 DIAGNOSIS — E46 Unspecified protein-calorie malnutrition: Secondary | ICD-10-CM | POA: Diagnosis not present

## 2016-11-04 DIAGNOSIS — I679 Cerebrovascular disease, unspecified: Secondary | ICD-10-CM | POA: Diagnosis not present

## 2016-11-04 DIAGNOSIS — I2609 Other pulmonary embolism with acute cor pulmonale: Secondary | ICD-10-CM | POA: Diagnosis not present

## 2016-11-04 DIAGNOSIS — G4733 Obstructive sleep apnea (adult) (pediatric): Secondary | ICD-10-CM | POA: Diagnosis not present

## 2016-11-04 DIAGNOSIS — J449 Chronic obstructive pulmonary disease, unspecified: Secondary | ICD-10-CM | POA: Diagnosis not present

## 2016-11-09 DIAGNOSIS — J449 Chronic obstructive pulmonary disease, unspecified: Secondary | ICD-10-CM | POA: Diagnosis not present

## 2016-11-09 DIAGNOSIS — I2609 Other pulmonary embolism with acute cor pulmonale: Secondary | ICD-10-CM | POA: Diagnosis not present

## 2016-11-09 DIAGNOSIS — E46 Unspecified protein-calorie malnutrition: Secondary | ICD-10-CM | POA: Diagnosis not present

## 2016-11-09 DIAGNOSIS — N390 Urinary tract infection, site not specified: Secondary | ICD-10-CM | POA: Diagnosis not present

## 2016-11-09 DIAGNOSIS — I679 Cerebrovascular disease, unspecified: Secondary | ICD-10-CM | POA: Diagnosis not present

## 2016-11-09 DIAGNOSIS — G309 Alzheimer's disease, unspecified: Secondary | ICD-10-CM | POA: Diagnosis not present

## 2016-11-09 DIAGNOSIS — G4733 Obstructive sleep apnea (adult) (pediatric): Secondary | ICD-10-CM | POA: Diagnosis not present

## 2016-11-09 DIAGNOSIS — J309 Allergic rhinitis, unspecified: Secondary | ICD-10-CM | POA: Diagnosis not present

## 2016-11-09 DIAGNOSIS — I10 Essential (primary) hypertension: Secondary | ICD-10-CM | POA: Diagnosis not present

## 2016-11-09 DIAGNOSIS — I4891 Unspecified atrial fibrillation: Secondary | ICD-10-CM | POA: Diagnosis not present

## 2016-11-14 DIAGNOSIS — G4733 Obstructive sleep apnea (adult) (pediatric): Secondary | ICD-10-CM | POA: Diagnosis not present

## 2016-11-14 DIAGNOSIS — G309 Alzheimer's disease, unspecified: Secondary | ICD-10-CM | POA: Diagnosis not present

## 2016-11-14 DIAGNOSIS — I2609 Other pulmonary embolism with acute cor pulmonale: Secondary | ICD-10-CM | POA: Diagnosis not present

## 2016-11-14 DIAGNOSIS — E46 Unspecified protein-calorie malnutrition: Secondary | ICD-10-CM | POA: Diagnosis not present

## 2016-11-14 DIAGNOSIS — I679 Cerebrovascular disease, unspecified: Secondary | ICD-10-CM | POA: Diagnosis not present

## 2016-11-14 DIAGNOSIS — J449 Chronic obstructive pulmonary disease, unspecified: Secondary | ICD-10-CM | POA: Diagnosis not present

## 2016-11-19 DIAGNOSIS — G4733 Obstructive sleep apnea (adult) (pediatric): Secondary | ICD-10-CM | POA: Diagnosis not present

## 2016-11-19 DIAGNOSIS — J449 Chronic obstructive pulmonary disease, unspecified: Secondary | ICD-10-CM | POA: Diagnosis not present

## 2016-11-19 DIAGNOSIS — I2609 Other pulmonary embolism with acute cor pulmonale: Secondary | ICD-10-CM | POA: Diagnosis not present

## 2016-11-19 DIAGNOSIS — G309 Alzheimer's disease, unspecified: Secondary | ICD-10-CM | POA: Diagnosis not present

## 2016-11-19 DIAGNOSIS — I679 Cerebrovascular disease, unspecified: Secondary | ICD-10-CM | POA: Diagnosis not present

## 2016-11-19 DIAGNOSIS — E46 Unspecified protein-calorie malnutrition: Secondary | ICD-10-CM | POA: Diagnosis not present

## 2016-11-20 DIAGNOSIS — G309 Alzheimer's disease, unspecified: Secondary | ICD-10-CM | POA: Diagnosis not present

## 2016-11-20 DIAGNOSIS — I679 Cerebrovascular disease, unspecified: Secondary | ICD-10-CM | POA: Diagnosis not present

## 2016-11-20 DIAGNOSIS — I2609 Other pulmonary embolism with acute cor pulmonale: Secondary | ICD-10-CM | POA: Diagnosis not present

## 2016-11-20 DIAGNOSIS — G4733 Obstructive sleep apnea (adult) (pediatric): Secondary | ICD-10-CM | POA: Diagnosis not present

## 2016-11-20 DIAGNOSIS — J449 Chronic obstructive pulmonary disease, unspecified: Secondary | ICD-10-CM | POA: Diagnosis not present

## 2016-11-20 DIAGNOSIS — E46 Unspecified protein-calorie malnutrition: Secondary | ICD-10-CM | POA: Diagnosis not present

## 2016-11-26 DIAGNOSIS — G309 Alzheimer's disease, unspecified: Secondary | ICD-10-CM | POA: Diagnosis not present

## 2016-11-26 DIAGNOSIS — J449 Chronic obstructive pulmonary disease, unspecified: Secondary | ICD-10-CM | POA: Diagnosis not present

## 2016-11-26 DIAGNOSIS — G4733 Obstructive sleep apnea (adult) (pediatric): Secondary | ICD-10-CM | POA: Diagnosis not present

## 2016-11-26 DIAGNOSIS — I2609 Other pulmonary embolism with acute cor pulmonale: Secondary | ICD-10-CM | POA: Diagnosis not present

## 2016-11-26 DIAGNOSIS — I679 Cerebrovascular disease, unspecified: Secondary | ICD-10-CM | POA: Diagnosis not present

## 2016-11-26 DIAGNOSIS — E46 Unspecified protein-calorie malnutrition: Secondary | ICD-10-CM | POA: Diagnosis not present

## 2016-12-02 DIAGNOSIS — G309 Alzheimer's disease, unspecified: Secondary | ICD-10-CM | POA: Diagnosis not present

## 2016-12-02 DIAGNOSIS — G4733 Obstructive sleep apnea (adult) (pediatric): Secondary | ICD-10-CM | POA: Diagnosis not present

## 2016-12-02 DIAGNOSIS — E46 Unspecified protein-calorie malnutrition: Secondary | ICD-10-CM | POA: Diagnosis not present

## 2016-12-02 DIAGNOSIS — I679 Cerebrovascular disease, unspecified: Secondary | ICD-10-CM | POA: Diagnosis not present

## 2016-12-02 DIAGNOSIS — I2609 Other pulmonary embolism with acute cor pulmonale: Secondary | ICD-10-CM | POA: Diagnosis not present

## 2016-12-02 DIAGNOSIS — J449 Chronic obstructive pulmonary disease, unspecified: Secondary | ICD-10-CM | POA: Diagnosis not present

## 2016-12-04 DIAGNOSIS — I679 Cerebrovascular disease, unspecified: Secondary | ICD-10-CM | POA: Diagnosis not present

## 2016-12-04 DIAGNOSIS — G309 Alzheimer's disease, unspecified: Secondary | ICD-10-CM | POA: Diagnosis not present

## 2016-12-04 DIAGNOSIS — I2609 Other pulmonary embolism with acute cor pulmonale: Secondary | ICD-10-CM | POA: Diagnosis not present

## 2016-12-04 DIAGNOSIS — J449 Chronic obstructive pulmonary disease, unspecified: Secondary | ICD-10-CM | POA: Diagnosis not present

## 2016-12-04 DIAGNOSIS — E46 Unspecified protein-calorie malnutrition: Secondary | ICD-10-CM | POA: Diagnosis not present

## 2016-12-04 DIAGNOSIS — G4733 Obstructive sleep apnea (adult) (pediatric): Secondary | ICD-10-CM | POA: Diagnosis not present

## 2016-12-10 DIAGNOSIS — G309 Alzheimer's disease, unspecified: Secondary | ICD-10-CM | POA: Diagnosis not present

## 2016-12-10 DIAGNOSIS — G4733 Obstructive sleep apnea (adult) (pediatric): Secondary | ICD-10-CM | POA: Diagnosis not present

## 2016-12-10 DIAGNOSIS — N390 Urinary tract infection, site not specified: Secondary | ICD-10-CM | POA: Diagnosis not present

## 2016-12-10 DIAGNOSIS — I2609 Other pulmonary embolism with acute cor pulmonale: Secondary | ICD-10-CM | POA: Diagnosis not present

## 2016-12-10 DIAGNOSIS — I679 Cerebrovascular disease, unspecified: Secondary | ICD-10-CM | POA: Diagnosis not present

## 2016-12-10 DIAGNOSIS — J449 Chronic obstructive pulmonary disease, unspecified: Secondary | ICD-10-CM | POA: Diagnosis not present

## 2016-12-10 DIAGNOSIS — E46 Unspecified protein-calorie malnutrition: Secondary | ICD-10-CM | POA: Diagnosis not present

## 2016-12-10 DIAGNOSIS — I10 Essential (primary) hypertension: Secondary | ICD-10-CM | POA: Diagnosis not present

## 2016-12-10 DIAGNOSIS — I4891 Unspecified atrial fibrillation: Secondary | ICD-10-CM | POA: Diagnosis not present

## 2016-12-10 DIAGNOSIS — J309 Allergic rhinitis, unspecified: Secondary | ICD-10-CM | POA: Diagnosis not present

## 2016-12-11 DIAGNOSIS — E46 Unspecified protein-calorie malnutrition: Secondary | ICD-10-CM | POA: Diagnosis not present

## 2016-12-11 DIAGNOSIS — I2609 Other pulmonary embolism with acute cor pulmonale: Secondary | ICD-10-CM | POA: Diagnosis not present

## 2016-12-11 DIAGNOSIS — J449 Chronic obstructive pulmonary disease, unspecified: Secondary | ICD-10-CM | POA: Diagnosis not present

## 2016-12-11 DIAGNOSIS — G4733 Obstructive sleep apnea (adult) (pediatric): Secondary | ICD-10-CM | POA: Diagnosis not present

## 2016-12-11 DIAGNOSIS — I679 Cerebrovascular disease, unspecified: Secondary | ICD-10-CM | POA: Diagnosis not present

## 2016-12-11 DIAGNOSIS — G309 Alzheimer's disease, unspecified: Secondary | ICD-10-CM | POA: Diagnosis not present

## 2016-12-18 DIAGNOSIS — G4733 Obstructive sleep apnea (adult) (pediatric): Secondary | ICD-10-CM | POA: Diagnosis not present

## 2016-12-18 DIAGNOSIS — G309 Alzheimer's disease, unspecified: Secondary | ICD-10-CM | POA: Diagnosis not present

## 2016-12-18 DIAGNOSIS — J449 Chronic obstructive pulmonary disease, unspecified: Secondary | ICD-10-CM | POA: Diagnosis not present

## 2016-12-18 DIAGNOSIS — I2609 Other pulmonary embolism with acute cor pulmonale: Secondary | ICD-10-CM | POA: Diagnosis not present

## 2016-12-18 DIAGNOSIS — E46 Unspecified protein-calorie malnutrition: Secondary | ICD-10-CM | POA: Diagnosis not present

## 2016-12-18 DIAGNOSIS — I679 Cerebrovascular disease, unspecified: Secondary | ICD-10-CM | POA: Diagnosis not present

## 2016-12-25 DIAGNOSIS — G309 Alzheimer's disease, unspecified: Secondary | ICD-10-CM | POA: Diagnosis not present

## 2016-12-25 DIAGNOSIS — J449 Chronic obstructive pulmonary disease, unspecified: Secondary | ICD-10-CM | POA: Diagnosis not present

## 2016-12-25 DIAGNOSIS — I2609 Other pulmonary embolism with acute cor pulmonale: Secondary | ICD-10-CM | POA: Diagnosis not present

## 2016-12-25 DIAGNOSIS — G4733 Obstructive sleep apnea (adult) (pediatric): Secondary | ICD-10-CM | POA: Diagnosis not present

## 2016-12-25 DIAGNOSIS — E46 Unspecified protein-calorie malnutrition: Secondary | ICD-10-CM | POA: Diagnosis not present

## 2016-12-25 DIAGNOSIS — I679 Cerebrovascular disease, unspecified: Secondary | ICD-10-CM | POA: Diagnosis not present

## 2017-01-01 DIAGNOSIS — G309 Alzheimer's disease, unspecified: Secondary | ICD-10-CM | POA: Diagnosis not present

## 2017-01-01 DIAGNOSIS — G4733 Obstructive sleep apnea (adult) (pediatric): Secondary | ICD-10-CM | POA: Diagnosis not present

## 2017-01-01 DIAGNOSIS — J449 Chronic obstructive pulmonary disease, unspecified: Secondary | ICD-10-CM | POA: Diagnosis not present

## 2017-01-01 DIAGNOSIS — I679 Cerebrovascular disease, unspecified: Secondary | ICD-10-CM | POA: Diagnosis not present

## 2017-01-01 DIAGNOSIS — I2609 Other pulmonary embolism with acute cor pulmonale: Secondary | ICD-10-CM | POA: Diagnosis not present

## 2017-01-01 DIAGNOSIS — E46 Unspecified protein-calorie malnutrition: Secondary | ICD-10-CM | POA: Diagnosis not present

## 2017-01-05 DIAGNOSIS — I2609 Other pulmonary embolism with acute cor pulmonale: Secondary | ICD-10-CM | POA: Diagnosis not present

## 2017-01-05 DIAGNOSIS — J449 Chronic obstructive pulmonary disease, unspecified: Secondary | ICD-10-CM | POA: Diagnosis not present

## 2017-01-05 DIAGNOSIS — I679 Cerebrovascular disease, unspecified: Secondary | ICD-10-CM | POA: Diagnosis not present

## 2017-01-05 DIAGNOSIS — G309 Alzheimer's disease, unspecified: Secondary | ICD-10-CM | POA: Diagnosis not present

## 2017-01-05 DIAGNOSIS — E46 Unspecified protein-calorie malnutrition: Secondary | ICD-10-CM | POA: Diagnosis not present

## 2017-01-05 DIAGNOSIS — G4733 Obstructive sleep apnea (adult) (pediatric): Secondary | ICD-10-CM | POA: Diagnosis not present

## 2017-01-09 DIAGNOSIS — G4733 Obstructive sleep apnea (adult) (pediatric): Secondary | ICD-10-CM | POA: Diagnosis not present

## 2017-01-09 DIAGNOSIS — I679 Cerebrovascular disease, unspecified: Secondary | ICD-10-CM | POA: Diagnosis not present

## 2017-01-09 DIAGNOSIS — J449 Chronic obstructive pulmonary disease, unspecified: Secondary | ICD-10-CM | POA: Diagnosis not present

## 2017-01-09 DIAGNOSIS — I2609 Other pulmonary embolism with acute cor pulmonale: Secondary | ICD-10-CM | POA: Diagnosis not present

## 2017-01-09 DIAGNOSIS — E46 Unspecified protein-calorie malnutrition: Secondary | ICD-10-CM | POA: Diagnosis not present

## 2017-01-09 DIAGNOSIS — G309 Alzheimer's disease, unspecified: Secondary | ICD-10-CM | POA: Diagnosis not present

## 2017-01-10 DIAGNOSIS — J449 Chronic obstructive pulmonary disease, unspecified: Secondary | ICD-10-CM | POA: Diagnosis not present

## 2017-01-10 DIAGNOSIS — N390 Urinary tract infection, site not specified: Secondary | ICD-10-CM | POA: Diagnosis not present

## 2017-01-10 DIAGNOSIS — I10 Essential (primary) hypertension: Secondary | ICD-10-CM | POA: Diagnosis not present

## 2017-01-10 DIAGNOSIS — E46 Unspecified protein-calorie malnutrition: Secondary | ICD-10-CM | POA: Diagnosis not present

## 2017-01-10 DIAGNOSIS — I679 Cerebrovascular disease, unspecified: Secondary | ICD-10-CM | POA: Diagnosis not present

## 2017-01-10 DIAGNOSIS — G309 Alzheimer's disease, unspecified: Secondary | ICD-10-CM | POA: Diagnosis not present

## 2017-01-10 DIAGNOSIS — J309 Allergic rhinitis, unspecified: Secondary | ICD-10-CM | POA: Diagnosis not present

## 2017-01-10 DIAGNOSIS — I2609 Other pulmonary embolism with acute cor pulmonale: Secondary | ICD-10-CM | POA: Diagnosis not present

## 2017-01-10 DIAGNOSIS — G4733 Obstructive sleep apnea (adult) (pediatric): Secondary | ICD-10-CM | POA: Diagnosis not present

## 2017-01-10 DIAGNOSIS — I4891 Unspecified atrial fibrillation: Secondary | ICD-10-CM | POA: Diagnosis not present

## 2017-01-14 DIAGNOSIS — J449 Chronic obstructive pulmonary disease, unspecified: Secondary | ICD-10-CM | POA: Diagnosis not present

## 2017-01-14 DIAGNOSIS — G4733 Obstructive sleep apnea (adult) (pediatric): Secondary | ICD-10-CM | POA: Diagnosis not present

## 2017-01-14 DIAGNOSIS — G309 Alzheimer's disease, unspecified: Secondary | ICD-10-CM | POA: Diagnosis not present

## 2017-01-14 DIAGNOSIS — I679 Cerebrovascular disease, unspecified: Secondary | ICD-10-CM | POA: Diagnosis not present

## 2017-01-14 DIAGNOSIS — E46 Unspecified protein-calorie malnutrition: Secondary | ICD-10-CM | POA: Diagnosis not present

## 2017-01-14 DIAGNOSIS — I2609 Other pulmonary embolism with acute cor pulmonale: Secondary | ICD-10-CM | POA: Diagnosis not present

## 2017-01-22 DIAGNOSIS — E46 Unspecified protein-calorie malnutrition: Secondary | ICD-10-CM | POA: Diagnosis not present

## 2017-01-22 DIAGNOSIS — I2609 Other pulmonary embolism with acute cor pulmonale: Secondary | ICD-10-CM | POA: Diagnosis not present

## 2017-01-22 DIAGNOSIS — J449 Chronic obstructive pulmonary disease, unspecified: Secondary | ICD-10-CM | POA: Diagnosis not present

## 2017-01-22 DIAGNOSIS — G309 Alzheimer's disease, unspecified: Secondary | ICD-10-CM | POA: Diagnosis not present

## 2017-01-22 DIAGNOSIS — G4733 Obstructive sleep apnea (adult) (pediatric): Secondary | ICD-10-CM | POA: Diagnosis not present

## 2017-01-22 DIAGNOSIS — I679 Cerebrovascular disease, unspecified: Secondary | ICD-10-CM | POA: Diagnosis not present

## 2017-01-29 DIAGNOSIS — I2609 Other pulmonary embolism with acute cor pulmonale: Secondary | ICD-10-CM | POA: Diagnosis not present

## 2017-01-29 DIAGNOSIS — G4733 Obstructive sleep apnea (adult) (pediatric): Secondary | ICD-10-CM | POA: Diagnosis not present

## 2017-01-29 DIAGNOSIS — E46 Unspecified protein-calorie malnutrition: Secondary | ICD-10-CM | POA: Diagnosis not present

## 2017-01-29 DIAGNOSIS — G309 Alzheimer's disease, unspecified: Secondary | ICD-10-CM | POA: Diagnosis not present

## 2017-01-29 DIAGNOSIS — J449 Chronic obstructive pulmonary disease, unspecified: Secondary | ICD-10-CM | POA: Diagnosis not present

## 2017-01-29 DIAGNOSIS — I679 Cerebrovascular disease, unspecified: Secondary | ICD-10-CM | POA: Diagnosis not present

## 2017-02-03 DIAGNOSIS — G4733 Obstructive sleep apnea (adult) (pediatric): Secondary | ICD-10-CM | POA: Diagnosis not present

## 2017-02-03 DIAGNOSIS — G309 Alzheimer's disease, unspecified: Secondary | ICD-10-CM | POA: Diagnosis not present

## 2017-02-03 DIAGNOSIS — I679 Cerebrovascular disease, unspecified: Secondary | ICD-10-CM | POA: Diagnosis not present

## 2017-02-03 DIAGNOSIS — I2609 Other pulmonary embolism with acute cor pulmonale: Secondary | ICD-10-CM | POA: Diagnosis not present

## 2017-02-03 DIAGNOSIS — E46 Unspecified protein-calorie malnutrition: Secondary | ICD-10-CM | POA: Diagnosis not present

## 2017-02-03 DIAGNOSIS — J449 Chronic obstructive pulmonary disease, unspecified: Secondary | ICD-10-CM | POA: Diagnosis not present

## 2017-02-09 DIAGNOSIS — G4733 Obstructive sleep apnea (adult) (pediatric): Secondary | ICD-10-CM | POA: Diagnosis not present

## 2017-02-09 DIAGNOSIS — N390 Urinary tract infection, site not specified: Secondary | ICD-10-CM | POA: Diagnosis not present

## 2017-02-09 DIAGNOSIS — E46 Unspecified protein-calorie malnutrition: Secondary | ICD-10-CM | POA: Diagnosis not present

## 2017-02-09 DIAGNOSIS — I679 Cerebrovascular disease, unspecified: Secondary | ICD-10-CM | POA: Diagnosis not present

## 2017-02-09 DIAGNOSIS — I4891 Unspecified atrial fibrillation: Secondary | ICD-10-CM | POA: Diagnosis not present

## 2017-02-09 DIAGNOSIS — G309 Alzheimer's disease, unspecified: Secondary | ICD-10-CM | POA: Diagnosis not present

## 2017-02-09 DIAGNOSIS — J309 Allergic rhinitis, unspecified: Secondary | ICD-10-CM | POA: Diagnosis not present

## 2017-02-09 DIAGNOSIS — I2609 Other pulmonary embolism with acute cor pulmonale: Secondary | ICD-10-CM | POA: Diagnosis not present

## 2017-02-09 DIAGNOSIS — J449 Chronic obstructive pulmonary disease, unspecified: Secondary | ICD-10-CM | POA: Diagnosis not present

## 2017-02-09 DIAGNOSIS — I10 Essential (primary) hypertension: Secondary | ICD-10-CM | POA: Diagnosis not present

## 2017-02-11 DIAGNOSIS — I2609 Other pulmonary embolism with acute cor pulmonale: Secondary | ICD-10-CM | POA: Diagnosis not present

## 2017-02-11 DIAGNOSIS — J449 Chronic obstructive pulmonary disease, unspecified: Secondary | ICD-10-CM | POA: Diagnosis not present

## 2017-02-11 DIAGNOSIS — G4733 Obstructive sleep apnea (adult) (pediatric): Secondary | ICD-10-CM | POA: Diagnosis not present

## 2017-02-11 DIAGNOSIS — I679 Cerebrovascular disease, unspecified: Secondary | ICD-10-CM | POA: Diagnosis not present

## 2017-02-11 DIAGNOSIS — E46 Unspecified protein-calorie malnutrition: Secondary | ICD-10-CM | POA: Diagnosis not present

## 2017-02-11 DIAGNOSIS — F329 Major depressive disorder, single episode, unspecified: Secondary | ICD-10-CM | POA: Diagnosis not present

## 2017-02-11 DIAGNOSIS — G309 Alzheimer's disease, unspecified: Secondary | ICD-10-CM | POA: Diagnosis not present

## 2017-02-11 DIAGNOSIS — F0390 Unspecified dementia without behavioral disturbance: Secondary | ICD-10-CM | POA: Diagnosis not present

## 2017-02-12 DIAGNOSIS — G309 Alzheimer's disease, unspecified: Secondary | ICD-10-CM | POA: Diagnosis not present

## 2017-02-12 DIAGNOSIS — E46 Unspecified protein-calorie malnutrition: Secondary | ICD-10-CM | POA: Diagnosis not present

## 2017-02-12 DIAGNOSIS — I679 Cerebrovascular disease, unspecified: Secondary | ICD-10-CM | POA: Diagnosis not present

## 2017-02-12 DIAGNOSIS — I2609 Other pulmonary embolism with acute cor pulmonale: Secondary | ICD-10-CM | POA: Diagnosis not present

## 2017-02-12 DIAGNOSIS — J449 Chronic obstructive pulmonary disease, unspecified: Secondary | ICD-10-CM | POA: Diagnosis not present

## 2017-02-12 DIAGNOSIS — G4733 Obstructive sleep apnea (adult) (pediatric): Secondary | ICD-10-CM | POA: Diagnosis not present

## 2017-02-18 DIAGNOSIS — G309 Alzheimer's disease, unspecified: Secondary | ICD-10-CM | POA: Diagnosis not present

## 2017-02-18 DIAGNOSIS — I2609 Other pulmonary embolism with acute cor pulmonale: Secondary | ICD-10-CM | POA: Diagnosis not present

## 2017-02-18 DIAGNOSIS — J449 Chronic obstructive pulmonary disease, unspecified: Secondary | ICD-10-CM | POA: Diagnosis not present

## 2017-02-18 DIAGNOSIS — G4733 Obstructive sleep apnea (adult) (pediatric): Secondary | ICD-10-CM | POA: Diagnosis not present

## 2017-02-18 DIAGNOSIS — I679 Cerebrovascular disease, unspecified: Secondary | ICD-10-CM | POA: Diagnosis not present

## 2017-02-18 DIAGNOSIS — E46 Unspecified protein-calorie malnutrition: Secondary | ICD-10-CM | POA: Diagnosis not present

## 2017-02-25 DIAGNOSIS — J449 Chronic obstructive pulmonary disease, unspecified: Secondary | ICD-10-CM | POA: Diagnosis not present

## 2017-02-25 DIAGNOSIS — F0390 Unspecified dementia without behavioral disturbance: Secondary | ICD-10-CM | POA: Diagnosis not present

## 2017-02-25 DIAGNOSIS — F329 Major depressive disorder, single episode, unspecified: Secondary | ICD-10-CM | POA: Diagnosis not present

## 2017-02-25 DIAGNOSIS — E46 Unspecified protein-calorie malnutrition: Secondary | ICD-10-CM | POA: Diagnosis not present

## 2017-02-25 DIAGNOSIS — I2609 Other pulmonary embolism with acute cor pulmonale: Secondary | ICD-10-CM | POA: Diagnosis not present

## 2017-02-25 DIAGNOSIS — G4733 Obstructive sleep apnea (adult) (pediatric): Secondary | ICD-10-CM | POA: Diagnosis not present

## 2017-02-25 DIAGNOSIS — I679 Cerebrovascular disease, unspecified: Secondary | ICD-10-CM | POA: Diagnosis not present

## 2017-02-25 DIAGNOSIS — G309 Alzheimer's disease, unspecified: Secondary | ICD-10-CM | POA: Diagnosis not present

## 2017-03-05 DIAGNOSIS — J449 Chronic obstructive pulmonary disease, unspecified: Secondary | ICD-10-CM | POA: Diagnosis not present

## 2017-03-05 DIAGNOSIS — I2609 Other pulmonary embolism with acute cor pulmonale: Secondary | ICD-10-CM | POA: Diagnosis not present

## 2017-03-05 DIAGNOSIS — G309 Alzheimer's disease, unspecified: Secondary | ICD-10-CM | POA: Diagnosis not present

## 2017-03-05 DIAGNOSIS — E46 Unspecified protein-calorie malnutrition: Secondary | ICD-10-CM | POA: Diagnosis not present

## 2017-03-05 DIAGNOSIS — G4733 Obstructive sleep apnea (adult) (pediatric): Secondary | ICD-10-CM | POA: Diagnosis not present

## 2017-03-05 DIAGNOSIS — I679 Cerebrovascular disease, unspecified: Secondary | ICD-10-CM | POA: Diagnosis not present

## 2017-03-12 DIAGNOSIS — I679 Cerebrovascular disease, unspecified: Secondary | ICD-10-CM | POA: Diagnosis not present

## 2017-03-12 DIAGNOSIS — N390 Urinary tract infection, site not specified: Secondary | ICD-10-CM | POA: Diagnosis not present

## 2017-03-12 DIAGNOSIS — J449 Chronic obstructive pulmonary disease, unspecified: Secondary | ICD-10-CM | POA: Diagnosis not present

## 2017-03-12 DIAGNOSIS — G309 Alzheimer's disease, unspecified: Secondary | ICD-10-CM | POA: Diagnosis not present

## 2017-03-12 DIAGNOSIS — G4733 Obstructive sleep apnea (adult) (pediatric): Secondary | ICD-10-CM | POA: Diagnosis not present

## 2017-03-12 DIAGNOSIS — I2609 Other pulmonary embolism with acute cor pulmonale: Secondary | ICD-10-CM | POA: Diagnosis not present

## 2017-03-12 DIAGNOSIS — I4891 Unspecified atrial fibrillation: Secondary | ICD-10-CM | POA: Diagnosis not present

## 2017-03-12 DIAGNOSIS — I10 Essential (primary) hypertension: Secondary | ICD-10-CM | POA: Diagnosis not present

## 2017-03-12 DIAGNOSIS — J309 Allergic rhinitis, unspecified: Secondary | ICD-10-CM | POA: Diagnosis not present

## 2017-03-12 DIAGNOSIS — E46 Unspecified protein-calorie malnutrition: Secondary | ICD-10-CM | POA: Diagnosis not present

## 2017-03-13 DIAGNOSIS — E46 Unspecified protein-calorie malnutrition: Secondary | ICD-10-CM | POA: Diagnosis not present

## 2017-03-13 DIAGNOSIS — J449 Chronic obstructive pulmonary disease, unspecified: Secondary | ICD-10-CM | POA: Diagnosis not present

## 2017-03-13 DIAGNOSIS — G4733 Obstructive sleep apnea (adult) (pediatric): Secondary | ICD-10-CM | POA: Diagnosis not present

## 2017-03-13 DIAGNOSIS — I2609 Other pulmonary embolism with acute cor pulmonale: Secondary | ICD-10-CM | POA: Diagnosis not present

## 2017-03-13 DIAGNOSIS — G309 Alzheimer's disease, unspecified: Secondary | ICD-10-CM | POA: Diagnosis not present

## 2017-03-13 DIAGNOSIS — I679 Cerebrovascular disease, unspecified: Secondary | ICD-10-CM | POA: Diagnosis not present

## 2017-03-18 DIAGNOSIS — I2609 Other pulmonary embolism with acute cor pulmonale: Secondary | ICD-10-CM | POA: Diagnosis not present

## 2017-03-18 DIAGNOSIS — I679 Cerebrovascular disease, unspecified: Secondary | ICD-10-CM | POA: Diagnosis not present

## 2017-03-18 DIAGNOSIS — E46 Unspecified protein-calorie malnutrition: Secondary | ICD-10-CM | POA: Diagnosis not present

## 2017-03-18 DIAGNOSIS — G4733 Obstructive sleep apnea (adult) (pediatric): Secondary | ICD-10-CM | POA: Diagnosis not present

## 2017-03-18 DIAGNOSIS — J449 Chronic obstructive pulmonary disease, unspecified: Secondary | ICD-10-CM | POA: Diagnosis not present

## 2017-03-18 DIAGNOSIS — G309 Alzheimer's disease, unspecified: Secondary | ICD-10-CM | POA: Diagnosis not present

## 2017-03-20 DIAGNOSIS — J449 Chronic obstructive pulmonary disease, unspecified: Secondary | ICD-10-CM | POA: Diagnosis not present

## 2017-03-20 DIAGNOSIS — I2609 Other pulmonary embolism with acute cor pulmonale: Secondary | ICD-10-CM | POA: Diagnosis not present

## 2017-03-20 DIAGNOSIS — G4733 Obstructive sleep apnea (adult) (pediatric): Secondary | ICD-10-CM | POA: Diagnosis not present

## 2017-03-20 DIAGNOSIS — I679 Cerebrovascular disease, unspecified: Secondary | ICD-10-CM | POA: Diagnosis not present

## 2017-03-20 DIAGNOSIS — E46 Unspecified protein-calorie malnutrition: Secondary | ICD-10-CM | POA: Diagnosis not present

## 2017-03-20 DIAGNOSIS — G309 Alzheimer's disease, unspecified: Secondary | ICD-10-CM | POA: Diagnosis not present

## 2017-03-21 DIAGNOSIS — G309 Alzheimer's disease, unspecified: Secondary | ICD-10-CM | POA: Diagnosis not present

## 2017-03-21 DIAGNOSIS — I2609 Other pulmonary embolism with acute cor pulmonale: Secondary | ICD-10-CM | POA: Diagnosis not present

## 2017-03-21 DIAGNOSIS — E46 Unspecified protein-calorie malnutrition: Secondary | ICD-10-CM | POA: Diagnosis not present

## 2017-03-21 DIAGNOSIS — I679 Cerebrovascular disease, unspecified: Secondary | ICD-10-CM | POA: Diagnosis not present

## 2017-03-21 DIAGNOSIS — G4733 Obstructive sleep apnea (adult) (pediatric): Secondary | ICD-10-CM | POA: Diagnosis not present

## 2017-03-21 DIAGNOSIS — J449 Chronic obstructive pulmonary disease, unspecified: Secondary | ICD-10-CM | POA: Diagnosis not present

## 2017-03-25 DIAGNOSIS — J449 Chronic obstructive pulmonary disease, unspecified: Secondary | ICD-10-CM | POA: Diagnosis not present

## 2017-03-25 DIAGNOSIS — I679 Cerebrovascular disease, unspecified: Secondary | ICD-10-CM | POA: Diagnosis not present

## 2017-03-25 DIAGNOSIS — G309 Alzheimer's disease, unspecified: Secondary | ICD-10-CM | POA: Diagnosis not present

## 2017-03-25 DIAGNOSIS — G4733 Obstructive sleep apnea (adult) (pediatric): Secondary | ICD-10-CM | POA: Diagnosis not present

## 2017-03-25 DIAGNOSIS — I2609 Other pulmonary embolism with acute cor pulmonale: Secondary | ICD-10-CM | POA: Diagnosis not present

## 2017-03-25 DIAGNOSIS — E46 Unspecified protein-calorie malnutrition: Secondary | ICD-10-CM | POA: Diagnosis not present

## 2017-03-31 DIAGNOSIS — G4733 Obstructive sleep apnea (adult) (pediatric): Secondary | ICD-10-CM | POA: Diagnosis not present

## 2017-03-31 DIAGNOSIS — J449 Chronic obstructive pulmonary disease, unspecified: Secondary | ICD-10-CM | POA: Diagnosis not present

## 2017-03-31 DIAGNOSIS — I679 Cerebrovascular disease, unspecified: Secondary | ICD-10-CM | POA: Diagnosis not present

## 2017-03-31 DIAGNOSIS — G309 Alzheimer's disease, unspecified: Secondary | ICD-10-CM | POA: Diagnosis not present

## 2017-03-31 DIAGNOSIS — E46 Unspecified protein-calorie malnutrition: Secondary | ICD-10-CM | POA: Diagnosis not present

## 2017-03-31 DIAGNOSIS — I2609 Other pulmonary embolism with acute cor pulmonale: Secondary | ICD-10-CM | POA: Diagnosis not present

## 2017-04-09 DIAGNOSIS — I679 Cerebrovascular disease, unspecified: Secondary | ICD-10-CM | POA: Diagnosis not present

## 2017-04-09 DIAGNOSIS — J449 Chronic obstructive pulmonary disease, unspecified: Secondary | ICD-10-CM | POA: Diagnosis not present

## 2017-04-09 DIAGNOSIS — G4733 Obstructive sleep apnea (adult) (pediatric): Secondary | ICD-10-CM | POA: Diagnosis not present

## 2017-04-09 DIAGNOSIS — E46 Unspecified protein-calorie malnutrition: Secondary | ICD-10-CM | POA: Diagnosis not present

## 2017-04-09 DIAGNOSIS — G309 Alzheimer's disease, unspecified: Secondary | ICD-10-CM | POA: Diagnosis not present

## 2017-04-09 DIAGNOSIS — I2609 Other pulmonary embolism with acute cor pulmonale: Secondary | ICD-10-CM | POA: Diagnosis not present

## 2017-04-11 DIAGNOSIS — G309 Alzheimer's disease, unspecified: Secondary | ICD-10-CM | POA: Diagnosis not present

## 2017-04-11 DIAGNOSIS — I4891 Unspecified atrial fibrillation: Secondary | ICD-10-CM | POA: Diagnosis not present

## 2017-04-11 DIAGNOSIS — I10 Essential (primary) hypertension: Secondary | ICD-10-CM | POA: Diagnosis not present

## 2017-04-11 DIAGNOSIS — G4733 Obstructive sleep apnea (adult) (pediatric): Secondary | ICD-10-CM | POA: Diagnosis not present

## 2017-04-11 DIAGNOSIS — N390 Urinary tract infection, site not specified: Secondary | ICD-10-CM | POA: Diagnosis not present

## 2017-04-11 DIAGNOSIS — E46 Unspecified protein-calorie malnutrition: Secondary | ICD-10-CM | POA: Diagnosis not present

## 2017-04-11 DIAGNOSIS — I2609 Other pulmonary embolism with acute cor pulmonale: Secondary | ICD-10-CM | POA: Diagnosis not present

## 2017-04-11 DIAGNOSIS — J309 Allergic rhinitis, unspecified: Secondary | ICD-10-CM | POA: Diagnosis not present

## 2017-04-11 DIAGNOSIS — I679 Cerebrovascular disease, unspecified: Secondary | ICD-10-CM | POA: Diagnosis not present

## 2017-04-11 DIAGNOSIS — J449 Chronic obstructive pulmonary disease, unspecified: Secondary | ICD-10-CM | POA: Diagnosis not present

## 2017-04-15 DIAGNOSIS — G4733 Obstructive sleep apnea (adult) (pediatric): Secondary | ICD-10-CM | POA: Diagnosis not present

## 2017-04-15 DIAGNOSIS — J449 Chronic obstructive pulmonary disease, unspecified: Secondary | ICD-10-CM | POA: Diagnosis not present

## 2017-04-15 DIAGNOSIS — E46 Unspecified protein-calorie malnutrition: Secondary | ICD-10-CM | POA: Diagnosis not present

## 2017-04-15 DIAGNOSIS — I2609 Other pulmonary embolism with acute cor pulmonale: Secondary | ICD-10-CM | POA: Diagnosis not present

## 2017-04-15 DIAGNOSIS — G309 Alzheimer's disease, unspecified: Secondary | ICD-10-CM | POA: Diagnosis not present

## 2017-04-15 DIAGNOSIS — I679 Cerebrovascular disease, unspecified: Secondary | ICD-10-CM | POA: Diagnosis not present

## 2017-04-16 DIAGNOSIS — G309 Alzheimer's disease, unspecified: Secondary | ICD-10-CM | POA: Diagnosis not present

## 2017-04-16 DIAGNOSIS — I2609 Other pulmonary embolism with acute cor pulmonale: Secondary | ICD-10-CM | POA: Diagnosis not present

## 2017-04-16 DIAGNOSIS — E46 Unspecified protein-calorie malnutrition: Secondary | ICD-10-CM | POA: Diagnosis not present

## 2017-04-16 DIAGNOSIS — G4733 Obstructive sleep apnea (adult) (pediatric): Secondary | ICD-10-CM | POA: Diagnosis not present

## 2017-04-16 DIAGNOSIS — J449 Chronic obstructive pulmonary disease, unspecified: Secondary | ICD-10-CM | POA: Diagnosis not present

## 2017-04-16 DIAGNOSIS — I679 Cerebrovascular disease, unspecified: Secondary | ICD-10-CM | POA: Diagnosis not present

## 2017-04-24 DIAGNOSIS — J449 Chronic obstructive pulmonary disease, unspecified: Secondary | ICD-10-CM | POA: Diagnosis not present

## 2017-04-24 DIAGNOSIS — G4733 Obstructive sleep apnea (adult) (pediatric): Secondary | ICD-10-CM | POA: Diagnosis not present

## 2017-04-24 DIAGNOSIS — I2609 Other pulmonary embolism with acute cor pulmonale: Secondary | ICD-10-CM | POA: Diagnosis not present

## 2017-04-24 DIAGNOSIS — I679 Cerebrovascular disease, unspecified: Secondary | ICD-10-CM | POA: Diagnosis not present

## 2017-04-24 DIAGNOSIS — E46 Unspecified protein-calorie malnutrition: Secondary | ICD-10-CM | POA: Diagnosis not present

## 2017-04-24 DIAGNOSIS — G309 Alzheimer's disease, unspecified: Secondary | ICD-10-CM | POA: Diagnosis not present

## 2017-04-30 DIAGNOSIS — J449 Chronic obstructive pulmonary disease, unspecified: Secondary | ICD-10-CM | POA: Diagnosis not present

## 2017-04-30 DIAGNOSIS — I2609 Other pulmonary embolism with acute cor pulmonale: Secondary | ICD-10-CM | POA: Diagnosis not present

## 2017-04-30 DIAGNOSIS — E46 Unspecified protein-calorie malnutrition: Secondary | ICD-10-CM | POA: Diagnosis not present

## 2017-04-30 DIAGNOSIS — G4733 Obstructive sleep apnea (adult) (pediatric): Secondary | ICD-10-CM | POA: Diagnosis not present

## 2017-04-30 DIAGNOSIS — I679 Cerebrovascular disease, unspecified: Secondary | ICD-10-CM | POA: Diagnosis not present

## 2017-04-30 DIAGNOSIS — G309 Alzheimer's disease, unspecified: Secondary | ICD-10-CM | POA: Diagnosis not present

## 2017-05-07 DIAGNOSIS — G309 Alzheimer's disease, unspecified: Secondary | ICD-10-CM | POA: Diagnosis not present

## 2017-05-07 DIAGNOSIS — I679 Cerebrovascular disease, unspecified: Secondary | ICD-10-CM | POA: Diagnosis not present

## 2017-05-07 DIAGNOSIS — J449 Chronic obstructive pulmonary disease, unspecified: Secondary | ICD-10-CM | POA: Diagnosis not present

## 2017-05-07 DIAGNOSIS — G4733 Obstructive sleep apnea (adult) (pediatric): Secondary | ICD-10-CM | POA: Diagnosis not present

## 2017-05-07 DIAGNOSIS — I2609 Other pulmonary embolism with acute cor pulmonale: Secondary | ICD-10-CM | POA: Diagnosis not present

## 2017-05-07 DIAGNOSIS — E46 Unspecified protein-calorie malnutrition: Secondary | ICD-10-CM | POA: Diagnosis not present

## 2017-05-08 DIAGNOSIS — F0390 Unspecified dementia without behavioral disturbance: Secondary | ICD-10-CM | POA: Diagnosis not present

## 2017-05-08 DIAGNOSIS — F329 Major depressive disorder, single episode, unspecified: Secondary | ICD-10-CM | POA: Diagnosis not present

## 2017-05-12 DIAGNOSIS — I10 Essential (primary) hypertension: Secondary | ICD-10-CM | POA: Diagnosis not present

## 2017-05-12 DIAGNOSIS — J309 Allergic rhinitis, unspecified: Secondary | ICD-10-CM | POA: Diagnosis not present

## 2017-05-12 DIAGNOSIS — I679 Cerebrovascular disease, unspecified: Secondary | ICD-10-CM | POA: Diagnosis not present

## 2017-05-12 DIAGNOSIS — G309 Alzheimer's disease, unspecified: Secondary | ICD-10-CM | POA: Diagnosis not present

## 2017-05-12 DIAGNOSIS — J449 Chronic obstructive pulmonary disease, unspecified: Secondary | ICD-10-CM | POA: Diagnosis not present

## 2017-05-12 DIAGNOSIS — I2609 Other pulmonary embolism with acute cor pulmonale: Secondary | ICD-10-CM | POA: Diagnosis not present

## 2017-05-12 DIAGNOSIS — G4733 Obstructive sleep apnea (adult) (pediatric): Secondary | ICD-10-CM | POA: Diagnosis not present

## 2017-05-12 DIAGNOSIS — N390 Urinary tract infection, site not specified: Secondary | ICD-10-CM | POA: Diagnosis not present

## 2017-05-12 DIAGNOSIS — E46 Unspecified protein-calorie malnutrition: Secondary | ICD-10-CM | POA: Diagnosis not present

## 2017-05-12 DIAGNOSIS — I4891 Unspecified atrial fibrillation: Secondary | ICD-10-CM | POA: Diagnosis not present

## 2017-05-15 DIAGNOSIS — I679 Cerebrovascular disease, unspecified: Secondary | ICD-10-CM | POA: Diagnosis not present

## 2017-05-15 DIAGNOSIS — G4733 Obstructive sleep apnea (adult) (pediatric): Secondary | ICD-10-CM | POA: Diagnosis not present

## 2017-05-15 DIAGNOSIS — J449 Chronic obstructive pulmonary disease, unspecified: Secondary | ICD-10-CM | POA: Diagnosis not present

## 2017-05-15 DIAGNOSIS — G309 Alzheimer's disease, unspecified: Secondary | ICD-10-CM | POA: Diagnosis not present

## 2017-05-15 DIAGNOSIS — E46 Unspecified protein-calorie malnutrition: Secondary | ICD-10-CM | POA: Diagnosis not present

## 2017-05-15 DIAGNOSIS — I2609 Other pulmonary embolism with acute cor pulmonale: Secondary | ICD-10-CM | POA: Diagnosis not present

## 2017-05-19 DIAGNOSIS — J449 Chronic obstructive pulmonary disease, unspecified: Secondary | ICD-10-CM | POA: Diagnosis not present

## 2017-05-19 DIAGNOSIS — I679 Cerebrovascular disease, unspecified: Secondary | ICD-10-CM | POA: Diagnosis not present

## 2017-05-19 DIAGNOSIS — G309 Alzheimer's disease, unspecified: Secondary | ICD-10-CM | POA: Diagnosis not present

## 2017-05-19 DIAGNOSIS — I2609 Other pulmonary embolism with acute cor pulmonale: Secondary | ICD-10-CM | POA: Diagnosis not present

## 2017-05-19 DIAGNOSIS — G4733 Obstructive sleep apnea (adult) (pediatric): Secondary | ICD-10-CM | POA: Diagnosis not present

## 2017-05-19 DIAGNOSIS — E46 Unspecified protein-calorie malnutrition: Secondary | ICD-10-CM | POA: Diagnosis not present

## 2017-05-26 DIAGNOSIS — J449 Chronic obstructive pulmonary disease, unspecified: Secondary | ICD-10-CM | POA: Diagnosis not present

## 2017-05-26 DIAGNOSIS — G4733 Obstructive sleep apnea (adult) (pediatric): Secondary | ICD-10-CM | POA: Diagnosis not present

## 2017-05-26 DIAGNOSIS — G309 Alzheimer's disease, unspecified: Secondary | ICD-10-CM | POA: Diagnosis not present

## 2017-05-26 DIAGNOSIS — E46 Unspecified protein-calorie malnutrition: Secondary | ICD-10-CM | POA: Diagnosis not present

## 2017-05-26 DIAGNOSIS — I2609 Other pulmonary embolism with acute cor pulmonale: Secondary | ICD-10-CM | POA: Diagnosis not present

## 2017-05-26 DIAGNOSIS — I679 Cerebrovascular disease, unspecified: Secondary | ICD-10-CM | POA: Diagnosis not present

## 2017-06-01 DIAGNOSIS — J449 Chronic obstructive pulmonary disease, unspecified: Secondary | ICD-10-CM | POA: Diagnosis not present

## 2017-06-01 DIAGNOSIS — G309 Alzheimer's disease, unspecified: Secondary | ICD-10-CM | POA: Diagnosis not present

## 2017-06-01 DIAGNOSIS — E46 Unspecified protein-calorie malnutrition: Secondary | ICD-10-CM | POA: Diagnosis not present

## 2017-06-01 DIAGNOSIS — G4733 Obstructive sleep apnea (adult) (pediatric): Secondary | ICD-10-CM | POA: Diagnosis not present

## 2017-06-01 DIAGNOSIS — I679 Cerebrovascular disease, unspecified: Secondary | ICD-10-CM | POA: Diagnosis not present

## 2017-06-01 DIAGNOSIS — I2609 Other pulmonary embolism with acute cor pulmonale: Secondary | ICD-10-CM | POA: Diagnosis not present

## 2017-06-05 DIAGNOSIS — J309 Allergic rhinitis, unspecified: Secondary | ICD-10-CM | POA: Diagnosis not present

## 2017-06-05 DIAGNOSIS — F0390 Unspecified dementia without behavioral disturbance: Secondary | ICD-10-CM | POA: Diagnosis not present

## 2017-06-05 DIAGNOSIS — I679 Cerebrovascular disease, unspecified: Secondary | ICD-10-CM | POA: Diagnosis not present

## 2017-06-05 DIAGNOSIS — G4733 Obstructive sleep apnea (adult) (pediatric): Secondary | ICD-10-CM | POA: Diagnosis not present

## 2017-06-05 DIAGNOSIS — G309 Alzheimer's disease, unspecified: Secondary | ICD-10-CM | POA: Diagnosis not present

## 2017-06-05 DIAGNOSIS — I2609 Other pulmonary embolism with acute cor pulmonale: Secondary | ICD-10-CM | POA: Diagnosis not present

## 2017-06-05 DIAGNOSIS — R627 Adult failure to thrive: Secondary | ICD-10-CM | POA: Diagnosis not present

## 2017-06-05 DIAGNOSIS — J449 Chronic obstructive pulmonary disease, unspecified: Secondary | ICD-10-CM | POA: Diagnosis not present

## 2017-06-05 DIAGNOSIS — K5900 Constipation, unspecified: Secondary | ICD-10-CM | POA: Diagnosis not present

## 2017-06-05 DIAGNOSIS — E46 Unspecified protein-calorie malnutrition: Secondary | ICD-10-CM | POA: Diagnosis not present

## 2017-06-06 ENCOUNTER — Other Ambulatory Visit: Payer: Self-pay

## 2017-06-06 ENCOUNTER — Emergency Department (HOSPITAL_COMMUNITY): Payer: Medicare Other

## 2017-06-06 ENCOUNTER — Inpatient Hospital Stay (HOSPITAL_COMMUNITY)
Admission: EM | Admit: 2017-06-06 | Discharge: 2017-06-09 | DRG: 482 | Disposition: A | Discharge status: Skilled Nursing Facility | Payer: Medicare Other | Attending: Internal Medicine | Admitting: Internal Medicine

## 2017-06-06 ENCOUNTER — Encounter (HOSPITAL_COMMUNITY): Payer: Self-pay | Admitting: Emergency Medicine

## 2017-06-06 DIAGNOSIS — M25551 Pain in right hip: Secondary | ICD-10-CM | POA: Diagnosis not present

## 2017-06-06 DIAGNOSIS — K219 Gastro-esophageal reflux disease without esophagitis: Secondary | ICD-10-CM | POA: Diagnosis present

## 2017-06-06 DIAGNOSIS — F0390 Unspecified dementia without behavioral disturbance: Secondary | ICD-10-CM | POA: Diagnosis present

## 2017-06-06 DIAGNOSIS — Y92129 Unspecified place in nursing home as the place of occurrence of the external cause: Secondary | ICD-10-CM | POA: Diagnosis not present

## 2017-06-06 DIAGNOSIS — Z882 Allergy status to sulfonamides status: Secondary | ICD-10-CM

## 2017-06-06 DIAGNOSIS — S72141A Displaced intertrochanteric fracture of right femur, initial encounter for closed fracture: Principal | ICD-10-CM | POA: Diagnosis present

## 2017-06-06 DIAGNOSIS — S72001A Fracture of unspecified part of neck of right femur, initial encounter for closed fracture: Secondary | ICD-10-CM | POA: Diagnosis present

## 2017-06-06 DIAGNOSIS — Z87891 Personal history of nicotine dependence: Secondary | ICD-10-CM | POA: Diagnosis not present

## 2017-06-06 DIAGNOSIS — Z8249 Family history of ischemic heart disease and other diseases of the circulatory system: Secondary | ICD-10-CM

## 2017-06-06 DIAGNOSIS — Z9181 History of falling: Secondary | ICD-10-CM | POA: Diagnosis not present

## 2017-06-06 DIAGNOSIS — Z90710 Acquired absence of both cervix and uterus: Secondary | ICD-10-CM | POA: Diagnosis not present

## 2017-06-06 DIAGNOSIS — Z7951 Long term (current) use of inhaled steroids: Secondary | ICD-10-CM

## 2017-06-06 DIAGNOSIS — J8410 Pulmonary fibrosis, unspecified: Secondary | ICD-10-CM | POA: Diagnosis present

## 2017-06-06 DIAGNOSIS — J438 Other emphysema: Secondary | ICD-10-CM

## 2017-06-06 DIAGNOSIS — Z66 Do not resuscitate: Secondary | ICD-10-CM | POA: Diagnosis present

## 2017-06-06 DIAGNOSIS — Z85820 Personal history of malignant melanoma of skin: Secondary | ICD-10-CM | POA: Diagnosis not present

## 2017-06-06 DIAGNOSIS — D649 Anemia, unspecified: Secondary | ICD-10-CM | POA: Diagnosis present

## 2017-06-06 DIAGNOSIS — J449 Chronic obstructive pulmonary disease, unspecified: Secondary | ICD-10-CM | POA: Diagnosis not present

## 2017-06-06 DIAGNOSIS — G309 Alzheimer's disease, unspecified: Secondary | ICD-10-CM | POA: Diagnosis not present

## 2017-06-06 DIAGNOSIS — G4730 Sleep apnea, unspecified: Secondary | ICD-10-CM | POA: Diagnosis present

## 2017-06-06 DIAGNOSIS — Z01818 Encounter for other preprocedural examination: Secondary | ICD-10-CM | POA: Diagnosis not present

## 2017-06-06 DIAGNOSIS — S79911A Unspecified injury of right hip, initial encounter: Secondary | ICD-10-CM | POA: Diagnosis not present

## 2017-06-06 DIAGNOSIS — K589 Irritable bowel syndrome without diarrhea: Secondary | ICD-10-CM | POA: Diagnosis present

## 2017-06-06 DIAGNOSIS — I4891 Unspecified atrial fibrillation: Secondary | ICD-10-CM | POA: Diagnosis present

## 2017-06-06 DIAGNOSIS — I7300 Raynaud's syndrome without gangrene: Secondary | ICD-10-CM | POA: Diagnosis present

## 2017-06-06 DIAGNOSIS — W010XXA Fall on same level from slipping, tripping and stumbling without subsequent striking against object, initial encounter: Secondary | ICD-10-CM | POA: Diagnosis present

## 2017-06-06 DIAGNOSIS — S299XXA Unspecified injury of thorax, initial encounter: Secondary | ICD-10-CM | POA: Diagnosis not present

## 2017-06-06 DIAGNOSIS — R1310 Dysphagia, unspecified: Secondary | ICD-10-CM | POA: Diagnosis not present

## 2017-06-06 DIAGNOSIS — K760 Fatty (change of) liver, not elsewhere classified: Secondary | ICD-10-CM | POA: Diagnosis present

## 2017-06-06 DIAGNOSIS — M810 Age-related osteoporosis without current pathological fracture: Secondary | ICD-10-CM | POA: Diagnosis present

## 2017-06-06 DIAGNOSIS — E46 Unspecified protein-calorie malnutrition: Secondary | ICD-10-CM | POA: Diagnosis not present

## 2017-06-06 DIAGNOSIS — Z881 Allergy status to other antibiotic agents status: Secondary | ICD-10-CM

## 2017-06-06 DIAGNOSIS — I10 Essential (primary) hypertension: Secondary | ICD-10-CM

## 2017-06-06 DIAGNOSIS — I2510 Atherosclerotic heart disease of native coronary artery without angina pectoris: Secondary | ICD-10-CM | POA: Diagnosis present

## 2017-06-06 DIAGNOSIS — Z9011 Acquired absence of right breast and nipple: Secondary | ICD-10-CM | POA: Diagnosis not present

## 2017-06-06 DIAGNOSIS — Z853 Personal history of malignant neoplasm of breast: Secondary | ICD-10-CM | POA: Diagnosis not present

## 2017-06-06 DIAGNOSIS — S7291XA Unspecified fracture of right femur, initial encounter for closed fracture: Secondary | ICD-10-CM | POA: Diagnosis not present

## 2017-06-06 DIAGNOSIS — I2609 Other pulmonary embolism with acute cor pulmonale: Secondary | ICD-10-CM | POA: Diagnosis not present

## 2017-06-06 DIAGNOSIS — I341 Nonrheumatic mitral (valve) prolapse: Secondary | ICD-10-CM | POA: Diagnosis present

## 2017-06-06 DIAGNOSIS — I679 Cerebrovascular disease, unspecified: Secondary | ICD-10-CM | POA: Diagnosis not present

## 2017-06-06 DIAGNOSIS — Z79899 Other long term (current) drug therapy: Secondary | ICD-10-CM

## 2017-06-06 DIAGNOSIS — T148XXA Other injury of unspecified body region, initial encounter: Secondary | ICD-10-CM | POA: Diagnosis not present

## 2017-06-06 DIAGNOSIS — Z419 Encounter for procedure for purposes other than remedying health state, unspecified: Secondary | ICD-10-CM

## 2017-06-06 DIAGNOSIS — S72009A Fracture of unspecified part of neck of unspecified femur, initial encounter for closed fracture: Secondary | ICD-10-CM | POA: Diagnosis present

## 2017-06-06 DIAGNOSIS — Z8673 Personal history of transient ischemic attack (TIA), and cerebral infarction without residual deficits: Secondary | ICD-10-CM | POA: Diagnosis not present

## 2017-06-06 DIAGNOSIS — J439 Emphysema, unspecified: Secondary | ICD-10-CM | POA: Diagnosis not present

## 2017-06-06 DIAGNOSIS — S72001D Fracture of unspecified part of neck of right femur, subsequent encounter for closed fracture with routine healing: Secondary | ICD-10-CM | POA: Diagnosis not present

## 2017-06-06 DIAGNOSIS — D72829 Elevated white blood cell count, unspecified: Secondary | ICD-10-CM | POA: Diagnosis present

## 2017-06-06 DIAGNOSIS — G8911 Acute pain due to trauma: Secondary | ICD-10-CM | POA: Diagnosis not present

## 2017-06-06 DIAGNOSIS — G4733 Obstructive sleep apnea (adult) (pediatric): Secondary | ICD-10-CM | POA: Diagnosis not present

## 2017-06-06 DIAGNOSIS — R41841 Cognitive communication deficit: Secondary | ICD-10-CM | POA: Diagnosis not present

## 2017-06-06 DIAGNOSIS — Z880 Allergy status to penicillin: Secondary | ICD-10-CM

## 2017-06-06 DIAGNOSIS — W19XXXA Unspecified fall, initial encounter: Secondary | ICD-10-CM | POA: Diagnosis not present

## 2017-06-06 DIAGNOSIS — R2689 Other abnormalities of gait and mobility: Secondary | ICD-10-CM | POA: Diagnosis not present

## 2017-06-06 LAB — CBC WITH DIFFERENTIAL/PLATELET
BASOS PCT: 0 %
Basophils Absolute: 0 10*3/uL (ref 0.0–0.1)
EOS PCT: 0 %
Eosinophils Absolute: 0 10*3/uL (ref 0.0–0.7)
HCT: 34.5 % — ABNORMAL LOW (ref 36.0–46.0)
HEMOGLOBIN: 11.4 g/dL — AB (ref 12.0–15.0)
LYMPHS ABS: 1.1 10*3/uL (ref 0.7–4.0)
Lymphocytes Relative: 8 %
MCH: 29.5 pg (ref 26.0–34.0)
MCHC: 33 g/dL (ref 30.0–36.0)
MCV: 89.1 fL (ref 78.0–100.0)
MONO ABS: 0.8 10*3/uL (ref 0.1–1.0)
MONOS PCT: 6 %
NEUTROS PCT: 86 %
Neutro Abs: 12.4 10*3/uL — ABNORMAL HIGH (ref 1.7–7.7)
PLATELETS: 174 10*3/uL (ref 150–400)
RBC: 3.87 MIL/uL (ref 3.87–5.11)
RDW: 13.8 % (ref 11.5–15.5)
WBC: 14.3 10*3/uL — ABNORMAL HIGH (ref 4.0–10.5)

## 2017-06-06 LAB — I-STAT CHEM 8, ED
BUN: 19 mg/dL (ref 6–20)
CALCIUM ION: 1.12 mmol/L — AB (ref 1.15–1.40)
CREATININE: 0.7 mg/dL (ref 0.44–1.00)
Chloride: 100 mmol/L — ABNORMAL LOW (ref 101–111)
Glucose, Bld: 152 mg/dL — ABNORMAL HIGH (ref 65–99)
HCT: 35 % — ABNORMAL LOW (ref 36.0–46.0)
Hemoglobin: 11.9 g/dL — ABNORMAL LOW (ref 12.0–15.0)
Potassium: 3.8 mmol/L (ref 3.5–5.1)
Sodium: 139 mmol/L (ref 135–145)
TCO2: 30 mmol/L (ref 22–32)

## 2017-06-06 LAB — URINALYSIS, ROUTINE W REFLEX MICROSCOPIC
BILIRUBIN URINE: NEGATIVE
GLUCOSE, UA: NEGATIVE mg/dL
HGB URINE DIPSTICK: NEGATIVE
KETONES UR: NEGATIVE mg/dL
Leukocytes, UA: NEGATIVE
Nitrite: NEGATIVE
Protein, ur: NEGATIVE mg/dL
Specific Gravity, Urine: 1.02 (ref 1.005–1.030)
pH: 6.5 (ref 5.0–8.0)

## 2017-06-06 MED ORDER — SENNOSIDES-DOCUSATE SODIUM 8.6-50 MG PO TABS
1.0000 | ORAL_TABLET | Freq: Every day | ORAL | Status: DC
Start: 2017-06-06 — End: 2017-06-09
  Administered 2017-06-06 – 2017-06-09 (×3): 1 via ORAL
  Filled 2017-06-06 (×3): qty 1

## 2017-06-06 MED ORDER — MORPHINE SULFATE (PF) 2 MG/ML IV SOLN
0.2500 mg | INTRAVENOUS | Status: DC | PRN
  Administered 2017-06-06 – 2017-06-07 (×2): 0.25 mg via INTRAVENOUS
  Filled 2017-06-06 (×2): qty 1

## 2017-06-06 MED ORDER — FLUTICASONE PROPIONATE 50 MCG/ACT NA SUSP
2.0000 | Freq: Two times a day (BID) | NASAL | Status: DC
  Administered 2017-06-06 – 2017-06-09 (×4): 2 via NASAL
  Filled 2017-06-06: qty 16

## 2017-06-06 MED ORDER — MIRTAZAPINE 15 MG PO TABS
15.0000 mg | ORAL_TABLET | Freq: Every day | ORAL | Status: DC
  Administered 2017-06-06 – 2017-06-08 (×3): 15 mg via ORAL
  Filled 2017-06-06 (×3): qty 1

## 2017-06-06 MED ORDER — HYDRALAZINE HCL 20 MG/ML IJ SOLN: 5.0000 mg | INTRAMUSCULAR | Status: DC | PRN

## 2017-06-06 MED ORDER — BUPIVACAINE-EPINEPHRINE (PF) 0.5% -1:200000 IJ SOLN
INTRAMUSCULAR | Status: AC
  Filled 2017-06-06: qty 30

## 2017-06-06 MED ORDER — IOPAMIDOL (ISOVUE-300) INJECTION 61%
INTRAVENOUS | Status: AC
  Filled 2017-06-06: qty 50

## 2017-06-06 NOTE — Consult Note (Signed)
Reason for Consult:  Right hip pain  Referring Physician: EDP  [name] [name] is an 84 y.o. female.  HPI: [place]. Right hip fracture  Past Medical History:  Diagnosis Date  . Airway obstruction   . Allergic rhinitis   . Anal stricture   . ANXIETY 08/16/2008  . Atrial fibrillation ([place])    she denies known hx of afib 02/24/13  . CAD, UNSPECIFIED SITE 10/17/2008  . Candida esophagitis ([place])   . CHOLELITHIASIS 08/23/2008  . Chronic constipation   . Colitis    sigmoid  . Complication of anesthesia    difficult intubation  . COPD 01/04/2010   FeV1 101%, DLCO64% 2008  . COPD (chronic obstructive pulmonary disease) ([place]) 01/04/2010  . 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 Diverticulitis   . DIVERTICULOSIS-COLON 04/07/2008  . Emphysema of lung ([place])   . Esophageal stricture   . Fatty liver   . GERD 04/19/2007  . Hearing aid worn    bilateral  . Hiatal hernia   . History of diverticulitis of colon   . HYPERSOMNIA, ASSOCIATED WITH SLEEP APNEA 02/01/2007  . HYPERTENSION 11/12/2006  . IBS (irritable bowel syndrome)   . MITRAL VALVE PROLAPSE, HX OF 10/07/2008  . NEOPLASM, MALIGNANT, BREAST, RIGHT 02/15/2008  . OSTEOPOROSIS 04/04/2008  . Pulmonary fibrosis ([place])   . Raynaud's syndrome 04/25/2009  . Sleep apnea   . Tortuous colon   . UTI (urinary tract infection)     Past Surgical History:  Procedure Laterality Date  . ABDOMINAL HYSTERECTOMY    . APPENDECTOMY    . BILATERAL SALPINGOOPHORECTOMY     s/p prolapsed bladder  . BLADDER SURGERY     prolapsed  . BREAST CYST EXCISION Left   . BREAST SURGERY  07-21-07   mastectomy (right), all margins neg and LNs neg   . EYE SURGERY     cataract removal bilateral  . LUNG SURGERY    . MASTECTOMY Right 07/21/2007   all margins neg and LNs neg   . MELANOMA EXCISION     rt clavicle Dr Rhoderick [facility] office)  . MOUTH SURGERY    . SHOULDER OPEN ROTATOR CUFF REPAIR Right   . THORACOTOMY Left    granuloma, pulmonary-thoracotomy  .  TONSILLECTOMY      Family History  Problem Relation Age of Onset  . Parkinsonism Father   . Coronary artery disease Father   . Stroke Mother   . Heart failure Mother   . Breast cancer Unknown        aunt  . Colon cancer Unknown        aunt, age 6  . Uterine cancer Unknown        aunt  . Breast cancer Maternal Aunt   . Colon cancer Maternal Aunt   . Uterine cancer Maternal Aunt   . Lung disease Neg Hx     Social History:  reports that she quit smoking about 34 years ago. Her smoking use included cigarettes. She has a 60.00 pack-year smoking history. she has never used smokeless tobacco. She reports that she drinks alcohol. She reports that she does not use drugs.  Allergies:  Allergies  Allergen Reactions  . Azithromycin Hives, Diarrhea, Dermatitis and Rash  . Ciprofloxacin Swelling  . Levaquin [Levofloxacin] Hives  . Penicillins Hives    has had mild doses that she did not have reactions to  Has patient had a PCN reaction causing immediate rash, facial/tongue/throat swelling, SOB or lightheadedness with hypotension:Yes Has patient had a PCN  reaction causing severe rash involving mucus membranes or skin necrosis: No Has patient had a PCN reaction that required hospitalization: no Has patient had a PCN reaction occurring within the last 10 years: no If all of the above answers are "NO", then may proceed with Cephalosporin use.   . Sulfamethoxazole Nausea And Vomiting    Medications: I have reviewed the patient's current medications.  Results for orders placed or performed during the hospital encounter of 06/06/17 (from the past 48 hour(s))  CBC with Differential/Platelet     Status: Abnormal   Collection Time: 06/06/17  1:31 AM  Result Value Ref Range   WBC 14.3 (H) 4.0 - 10.5 K/uL   RBC 3.87 3.87 - 5.11 MIL/uL   Hemoglobin 11.4 (L) 12.0 - 15.0 g/dL   HCT 34.5 (L) 36.0 - 46.0 %   MCV 89.1 78.0 - 100.0 fL   MCH 29.5 26.0 - 34.0 pg   MCHC 33.0 30.0 - 36.0 g/dL   RDW  13.8 11.5 - 15.5 %   Platelets 174 150 - 400 K/uL   Neutrophils Relative % 86 %   Neutro Abs 12.4 (H) 1.7 - 7.7 K/uL   Lymphocytes Relative 8 %   Lymphs Abs 1.1 0.7 - 4.0 K/uL   Monocytes Relative 6 %   Monocytes Absolute 0.8 0.1 - 1.0 K/uL   Eosinophils Relative 0 %   Eosinophils Absolute 0.0 0.0 - 0.7 K/uL   Basophils Relative 0 %   Basophils Absolute 0.0 0.0 - 0.1 K/uL  I-Stat Chem 8, ED     Status: Abnormal   Collection Time: 06/06/17  1:39 AM  Result Value Ref Range   Sodium 139 135 - 145 mmol/L   Potassium 3.8 3.5 - 5.1 mmol/L   Chloride 100 (L) 101 - 111 mmol/L   BUN 19 6 - 20 mg/dL   Creatinine, Ser 0.70 0.44 - 1.00 mg/dL   Glucose, Bld 152 (H) 65 - 99 mg/dL   Calcium, Ion 1.12 (L) 1.15 - 1.40 mmol/L   TCO2 30 22 - 32 mmol/L   Hemoglobin 11.9 (L) 12.0 - 15.0 g/dL   HCT 35.0 (L) 36.0 - 46.0 %    Dg Chest 2 View  Result Date: 06/06/2017 CLINICAL DATA:  Right hip swelling and pain after a fall. History of COPD, emphysema, hypertension, atrial fibrillation, former smoker. EXAM: CHEST  2 VIEW COMPARISON:  CT chest 04/25/2016.  Chest radiograph 04/24/2016. FINDINGS: Hyperinflation consistent with emphysema. Interstitial fibrosis and bronchial wall thickening consistent with chronic bronchitis. Left upper lung nodule measuring 1.7 cm diameter. This is shown to represent a calcified granuloma on the previous CT. No focal consolidation or airspace disease. Cardiac enlargement. No vascular congestion or edema. Compression of midthoracic vertebrae similar to prior study. Calcification of the aorta. Postoperative right mastectomy with surgical clips in the right axilla. IMPRESSION: Emphysematous and chronic bronchitic changes in the lungs. Stable appearance of a left lung nodule consistent with calcified granuloma. Cardiac enlargement. No focal consolidation or edema. Aortic atherosclerosis. Electronically Signed   By: Lucienne Capers M.D.   On: 06/06/2017 02:12   Dg Hip Unilat W Or Wo  Pelvis 2-3 Views Right  Result Date: 06/06/2017 CLINICAL DATA:  Right hip pain after a fall. EXAM: DG HIP (WITH OR WITHOUT PELVIS) 2-3V RIGHT COMPARISON:  None. FINDINGS: Inter trochanteric fracture of the proximal right femur with mild varus angulation. No significant displacement. Mild impaction. No dislocation of the right hip. Degenerative changes are seen in the lower lumbar  spine and in both hips. Pelvis appears intact. Vascular calcifications are present. IMPRESSION: Inter trochanteric fracture of the right proximal femur with mild impaction and varus angulation. Electronically Signed   By: Lucienne Capers M.D.   On: 06/06/2017 02:13    Review of Systems  Respiratory: Positive for shortness of breath.   Musculoskeletal: Positive for joint pain.   Blood pressure (!) 153/65, pulse 78, temperature 99 F (37.2 C), temperature source Oral, resp. rate 15, height 5\' 2"  (1.575 m), weight 40.8 kg (90 lb), SpO2 90 %. Physical Exam  Vitals reviewed. Constitutional: She is oriented to person, place, and time. She appears well-developed.  HENT:  Head: Normocephalic.  Eyes: Pupils are equal, round, and reactive to light.  Neck: Normal range of motion.  Cardiovascular: Normal rate.  Respiratory: Effort normal.  GI: Soft.  Musculoskeletal: She exhibits tenderness and deformity.  Right LE slightly short with ER. NVI. Compartments soft. Good capillary refill. No DVT  Neurological: She is alert and oriented to person, place, and time.  Skin: Skin is warm and dry.  Psychiatric: She has a normal mood and affect.    Assessment/Plan:  Right intertrochanteric hip fracture. COPD. Plan IM nail following clearance.  Shad Ledvina C 06/06/2017, 7:32 AM

## 2017-06-06 NOTE — Progress Notes (Signed)
Patient is admitted this am. Elderly female laying in bed, currently denies pain. She is slightly confused about the time, she knows she is in the hospital. Lung clear on exam. Will follow pulm/ortho recommendations.

## 2017-06-06 NOTE — ED Notes (Signed)
Bed: FM40 Expected date:  Expected time:  Means of arrival:  Comments: 84 yo F  Fall right hip pain

## 2017-06-06 NOTE — H&P (Signed)
History and Physical    [name] [name] QPR:[id number] DOB: [birth year] DOA: 06/06/2017  PCP: Marin Olp, MD  Patient coming from: Skilled nursing facility.  Chief Complaint: Fall.  HPI: [name] [name] is a 84 y.o. female with history of COPD, hypertension presently not on medications had a fall at the skilled nursing facility after slipping on her socks.  Patient denies hitting her head or losing consciousness.  Denies any chest pain or shortness of breath.  ED Course: In the ER x-rays revealed right hip fracture and on-call orthopedic surgeon Dr. Elmyra Ricks was consulted.  Patient is being admitted for further management of right hip fracture.  As per patient's daughter was at the bedside patient has a difficult airway and there is concern about any surgery requiring intubation.  At the time of my exam patient is not wheezing and denies any chest pain or shortness of breath.  Review of Systems: As per HPI, rest all negative.   Past Medical History:  Diagnosis Date  . Airway obstruction   . Allergic rhinitis   . Anal stricture   . ANXIETY 08/16/2008  . Atrial fibrillation ([place])    she denies known hx of afib 02/24/13  . CAD, UNSPECIFIED SITE 10/17/2008  . Candida esophagitis ([place])   . CHOLELITHIASIS 08/23/2008  . Chronic constipation   . Colitis    sigmoid  . Complication of anesthesia    difficult intubation  . COPD 01/04/2010   FeV1 101%, DLCO64% 2008  . COPD (chronic obstructive pulmonary disease) ([place]) 01/04/2010  . 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 Diverticulitis   . DIVERTICULOSIS-COLON 04/07/2008  . Emphysema of lung ([place])   . Esophageal stricture   . Fatty liver   . GERD 04/19/2007  . Hearing aid worn    bilateral  . Hiatal hernia   . History of diverticulitis of colon   . HYPERSOMNIA, ASSOCIATED WITH SLEEP APNEA 02/01/2007  . HYPERTENSION 11/12/2006  . IBS (irritable bowel syndrome)   . MITRAL VALVE PROLAPSE, HX OF 10/07/2008  . NEOPLASM, MALIGNANT, BREAST,  RIGHT 02/15/2008  . OSTEOPOROSIS 04/04/2008  . Pulmonary fibrosis ([place])   . Raynaud's syndrome 04/25/2009  . Sleep apnea   . Tortuous colon   . UTI (urinary tract infection)     Past Surgical History:  Procedure Laterality Date  . ABDOMINAL HYSTERECTOMY    . APPENDECTOMY    . BILATERAL SALPINGOOPHORECTOMY     s/p prolapsed bladder  . BLADDER SURGERY     prolapsed  . BREAST CYST EXCISION Left   . BREAST SURGERY  07-21-07   mastectomy (right), all margins neg and LNs neg   . EYE SURGERY     cataract removal bilateral  . LUNG SURGERY    . MASTECTOMY Right 07/21/2007   all margins neg and LNs neg   . MELANOMA EXCISION     rt clavicle Dr Rhoderick [facility] office)  . MOUTH SURGERY    . SHOULDER OPEN ROTATOR CUFF REPAIR Right   . THORACOTOMY Left    granuloma, pulmonary-thoracotomy  . TONSILLECTOMY       reports that she quit smoking about 34 years ago. Her smoking use included cigarettes. She has a 60.00 pack-year smoking history. she has never used smokeless tobacco. She reports that she drinks alcohol. She reports that she does not use drugs.  Allergies  Allergen Reactions  . Azithromycin Hives, Diarrhea, Dermatitis and Rash  . Ciprofloxacin Swelling  . Levaquin [Levofloxacin] Hives  . Penicillins Hives  has had mild doses that she did not have reactions to  Has patient had a PCN reaction causing immediate rash, facial/tongue/throat swelling, SOB or lightheadedness with hypotension:Yes Has patient had a PCN reaction causing severe rash involving mucus membranes or skin necrosis: No Has patient had a PCN reaction that required hospitalization: no Has patient had a PCN reaction occurring within the last 10 years: no If all of the above answers are "NO", then may proceed with Cephalosporin use.   . Sulfamethoxazole Nausea And Vomiting    Family History  Problem Relation Age of Onset  . Parkinsonism Father   . Coronary artery disease Father   . Stroke Mother   . Heart  failure Mother   . Breast cancer Unknown        aunt  . Colon cancer Unknown        aunt, age 50  . Uterine cancer Unknown        aunt  . Breast cancer Maternal Aunt   . Colon cancer Maternal Aunt   . Uterine cancer Maternal Aunt   . Lung disease Neg Hx     Prior to Admission medications   Medication Sig Start Date End Date Taking? Authorizing Provider  fluticasone (FLONASE) 50 MCG/ACT nasal spray Place 2 sprays into both nostrils daily. Patient taking differently: Place 2 sprays into both nostrils 2 (two) times daily.  11/19/15  Yes Kordsmeier, Gregary Signs, FNP  mirtazapine (REMERON) 7.5 MG tablet Take 1 tablet (7.5 mg total) by mouth at bedtime. Patient taking differently: Take 15 mg by mouth at bedtime.  07/04/16  Yes Marin Olp, MD  senna-docusate (SENOKOT-S) 8.6-50 MG tablet Take 1 tablet by mouth daily.   Yes [provider]  acetaminophen (TYLENOL) 325 MG tablet Take 2 tablets (650 mg total) by mouth every 6 (six) hours as needed for mild pain (or Fever >/= 101). Patient not taking: Reported on 06/06/2017 05/02/16   Cristal Ford, DO  albuterol (PROVENTIL HFA;VENTOLIN HFA) 108 [phone] Base) MCG/ACT inhaler Inhale 2 puffs into the lungs every 6 (six) hours as needed for wheezing or shortness of breath. Patient not taking: Reported on 06/06/2017 11/23/15   Dorothyann Peng, NP  apixaban (ELIQUIS) 5 MG TABS tablet Take 1 tablet (5 mg total) by mouth 2 (two) times daily. Start on 05/05/2016 Patient not taking: Reported on 06/06/2017 07/25/16   Marin Olp, MD  budesonide-formoterol [facility]) 160-4.5 MCG/ACT inhaler Inhale 2 puffs into the lungs 2 (two) times daily. Patient not taking: Reported on 06/06/2017 02/19/16   Marin Olp, MD  diazepam (VALIUM) 5 MG tablet TAKE 1 TABLET BY MOUTH AT BEDTIME Patient not taking: Reported on 06/06/2017 07/01/16   Marin Olp, MD  nitroGLYCERIN (NITROSTAT) 0.4 MG SL tablet Place 1 tablet (0.4 mg total) under the tongue every 5  (five) minutes as needed for chest pain (take as directed). Patient not taking: Reported on 06/06/2017 04/12/15   Fay Records, MD  ondansetron (ZOFRAN) 4 MG tablet TAKE 1 TABLET (4 MG TOTAL) BY MOUTH EVERY 8 (EIGHT) HOURS AS NEEDED FOR NAUSEA OR VOMITING. Patient not taking: Reported on 06/06/2017 07/01/16   Marin Olp, MD    Physical Exam: Vitals:   06/06/17 0041 06/06/17 0200 06/06/17 0400 06/06/17 0443  BP: (!) 189/94 (!) 164/72 (!) 159/71 (!) 153/65  Pulse: 72 70 79 78  Resp: 20 16 16 15   Temp: 97.8 F (36.6 C)   99 F (37.2 C)  TempSrc: Oral   Oral  SpO2: 100% 100% 99% 90%      Constitutional: Moderately built and nourished. Vitals:   06/06/17 0041 06/06/17 0200 06/06/17 0400 06/06/17 0443  BP: (!) 189/94 (!) 164/72 (!) 159/71 (!) 153/65  Pulse: 72 70 79 78  Resp: 20 16 16 15   Temp: 97.8 F (36.6 C)   99 F (37.2 C)  TempSrc: Oral   Oral  SpO2: 100% 100% 99% 90%   Eyes: Anicteric no pallor. ENMT: No discharge from the ears eyes nose or mouth. Neck: No mass felt.  No neck rigidity.  No JVD appreciated. Respiratory: No rhonchi or crepitations. Cardiovascular: S1-S2 heard no murmurs appreciated. Abdomen: Soft nontender bowel sounds present. Musculoskeletal: Pain on moving right hip. Skin: No rash.  Skin appears warm. Neurologic: Alert awake oriented to time place and person.  Moves all extremities. Psychiatric: Appears normal.  Normal affect.   Labs on Admission: I have personally reviewed following labs and imaging studies  CBC: Recent Labs  Lab 06/06/17 0131 06/06/17 0139  WBC 14.3*  --   NEUTROABS 12.4*  --   HGB 11.4* 11.9*  HCT 34.5* 35.0*  MCV 89.1  --   PLT 174  --    Basic Metabolic Panel: Recent Labs  Lab 06/06/17 0139  NA 139  K 3.8  CL 100*  GLUCOSE 152*  BUN 19  CREATININE 0.70   GFR: CrCl cannot be calculated (Unknown ideal weight.). Liver Function Tests: No results for input(s): AST, ALT, ALKPHOS, BILITOT, PROT, ALBUMIN in  the last 168 hours. No results for input(s): LIPASE, AMYLASE in the last 168 hours. No results for input(s): AMMONIA in the last 168 hours. Coagulation Profile: No results for input(s): INR, PROTIME in the last 168 hours. Cardiac Enzymes: No results for input(s): CKTOTAL, CKMB, CKMBINDEX, TROPONINI in the last 168 hours. BNP (last 3 results) No results for input(s): PROBNP in the last 8760 hours. HbA1C: No results for input(s): HGBA1C in the last 72 hours. CBG: No results for input(s): GLUCAP in the last 168 hours. Lipid Profile: No results for input(s): CHOL, HDL, LDLCALC, TRIG, CHOLHDL, LDLDIRECT in the last 72 hours. Thyroid Function Tests: No results for input(s): TSH, T4TOTAL, FREET4, T3FREE, THYROIDAB in the last 72 hours. Anemia Panel: No results for input(s): VITAMINB12, FOLATE, FERRITIN, TIBC, IRON, RETICCTPCT in the last 72 hours. Urine analysis:    Component Value Date/Time   COLORURINE YELLOW 04/29/2016 0521   APPEARANCEUR CLEAR 04/29/2016 0521   LABSPEC >1.046 (H) 04/29/2016 0521   PHURINE 5.0 04/29/2016 0521   GLUCOSEU NEGATIVE 04/29/2016 0521   HGBUR MODERATE (A) 04/29/2016 0521   HGBUR negative 04/07/2008 1505   BILIRUBINUR 1+ 07/25/2016 1235   KETONESUR 20 (A) 04/29/2016 0521   PROTEINUR 2+ 07/25/2016 1235   PROTEINUR NEGATIVE 04/29/2016 0521   UROBILINOGEN 1.0 07/25/2016 1235   UROBILINOGEN 0.2 07/25/2014 2145   NITRITE positive 07/25/2016 1235   NITRITE NEGATIVE 04/29/2016 0521   LEUKOCYTESUR large (3+) (A) 07/25/2016 1235   Sepsis Labs: @LABRCNTIP (procalcitonin:4,lacticidven:4) )No results found for this or any previous visit (from the past 240 hour(s)).   Radiological Exams on Admission: Dg Chest 2 View  Result Date: 06/06/2017 CLINICAL DATA:  Right hip swelling and pain after a fall. History of COPD, emphysema, hypertension, atrial fibrillation, former smoker. EXAM: CHEST  2 VIEW COMPARISON:  CT chest 04/25/2016.  Chest radiograph 04/24/2016.  FINDINGS: Hyperinflation consistent with emphysema. Interstitial fibrosis and bronchial wall thickening consistent with chronic bronchitis. Left upper lung nodule measuring 1.7 cm diameter. This  is shown to represent a calcified granuloma on the previous CT. No focal consolidation or airspace disease. Cardiac enlargement. No vascular congestion or edema. Compression of midthoracic vertebrae similar to prior study. Calcification of the aorta. Postoperative right mastectomy with surgical clips in the right axilla. IMPRESSION: Emphysematous and chronic bronchitic changes in the lungs. Stable appearance of a left lung nodule consistent with calcified granuloma. Cardiac enlargement. No focal consolidation or edema. Aortic atherosclerosis. Electronically Signed   By: Lucienne Capers M.D.   On: 06/06/2017 02:12   Dg Hip Unilat W Or Wo Pelvis 2-3 Views Right  Result Date: 06/06/2017 CLINICAL DATA:  Right hip pain after a fall. EXAM: DG HIP (WITH OR WITHOUT PELVIS) 2-3V RIGHT COMPARISON:  None. FINDINGS: Inter trochanteric fracture of the proximal right femur with mild varus angulation. No significant displacement. Mild impaction. No dislocation of the right hip. Degenerative changes are seen in the lower lumbar spine and in both hips. Pelvis appears intact. Vascular calcifications are present. IMPRESSION: Inter trochanteric fracture of the right proximal femur with mild impaction and varus angulation. Electronically Signed   By: Lucienne Capers M.D.   On: 06/06/2017 02:13    EKG: Independently reviewed.  Normal sinus rhythm with IVCD.  Assessment/Plan Principal Problem:   Closed right hip fracture, initial encounter [facility]) Active Problems:   Essential hypertension   COPD with emphysema [place]   History of breast cancer, T1b, N0, Lumpectomy 07/21/2007.   DNR (do not resuscitate)   Hip fracture ([place])    1. Right hip fracture status post mechanical fall -patient's daughter at this time has consented for  surgery requiring intubation since patient has difficult airway previously.  Patient used to follow-up with Dr. Joya Gaskins pulmonologist previously and would like pulmonary consult with regarding to patient's further management of the right hip fracture requiring surgery.  I will consult pulmonologist for further recommendations.  Since orthopedic surgeon Dr. Elmyra Ricks is only planning surgery tomorrow will keep patient on diet for now.  Pain relief medications. 2. Elevated blood pressure with previous history of hypertension -presently not on medications.  Will keep patient on PRN IV hydralazine for systolic blood pressure more than 180 and closely follow blood pressure trends. 3. History of COPD -not actively wheezing and not on any inhalers.  Closely observe. 4. History of breast cancer in remission. 5. Normocytic normochromic anemia -follow CBC. 6. Leukocytosis likely reactionary.  Patient is afebrile.  Chest x-ray does not show any infiltrates.  UA is pending.   DVT prophylaxis: SCDs. Code Status: DNR. Family Communication: Patient's daughter. Disposition Plan: Skilled nursing facility. Consults called: Orthopedics and pulmonologist. Admission status: Inpatient.   Rise Patience MD [facility] Pager [phone].  If 7PM-7AM, please contact night-coverage [email or web address] Password [facility]  06/06/2017, 4:49 AM

## 2017-06-06 NOTE — Progress Notes (Addendum)
Hospice and [facility])   This is a related and covered GIP admission of 06/06/17 with [facility] diagnosis of unspecified protein calorie malnutrition per Dr. Earlie Counts. Patient has an out of facility DNR located in shadow chart. [facility] was notified of transfer to [facility] ED by [facility]. Patient was admitted to hospital for management of R hip fracture. Patient was been active with [facility] since 07/24/16.   Visited briefly with patient who awake and alert. She is very hard of hearing and directed me to her right side. She was able to tell me her name and date of birth. She is aware she is in the hospital with a broken hip with plan for surgery pending approval. She has taken sips of orange juice from her breakfast tray. Appreciate report from CNA Cami.   Chart reviewed and spoke with daughter Gaynelle Adu by phone. [name] confirms she is waiting to learn if patient will be cleared for surgery, with possible surgery Sunday or Monday. Patient will need to revoke hospice services prior to surgery due to surgery and rehab are outside of hospice plan of care. Will continue to follow throughout the day.   Have made [facility] Alesia aware patient is active with [facility].  Please do not hesitate to call with hospice related questions.  ADDENDEM 5:46PM Patient revoked hospice services earlier this afternoon to pursue surgery and rehab. Notified RNCM Alesia.   Thank you,  Erling Conte, LCSW [phone]

## 2017-06-06 NOTE — ED Provider Notes (Signed)
[facility] DEPT Provider Note   CSN: [id number] Arrival date & time: 06/06/17  0031     History   Chief Complaint Chief Complaint  Patient presents with  . Fall    HPI [name] [name] is a 84 y.o. female.  The history is provided by the patient and the EMS personnel.  Fall  This is a new problem. The current episode started less than 1 hour ago. The problem occurs constantly. The problem has not changed since onset.Pertinent negatives include no chest pain, no abdominal pain, no headaches and no shortness of breath. Nothing aggravates the symptoms. Nothing relieves the symptoms. She has tried nothing for the symptoms. The treatment provided no relief.    Past Medical History:  Diagnosis Date  . Airway obstruction   . Allergic rhinitis   . Anal stricture   . ANXIETY 08/16/2008  . Atrial fibrillation ([place])    she denies known hx of afib 02/24/13  . CAD, UNSPECIFIED SITE 10/17/2008  . Candida esophagitis ([place])   . CHOLELITHIASIS 08/23/2008  . Chronic constipation   . Colitis    sigmoid  . Complication of anesthesia    difficult intubation  . COPD 01/04/2010   FeV1 101%, DLCO64% 2008  . COPD (chronic obstructive pulmonary disease) ([place]) 01/04/2010  . 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 Diverticulitis   . DIVERTICULOSIS-COLON 04/07/2008  . Emphysema of lung ([place])   . Esophageal stricture   . Fatty liver   . GERD 04/19/2007  . Hearing aid worn    bilateral  . Hiatal hernia   . History of diverticulitis of colon   . HYPERSOMNIA, ASSOCIATED WITH SLEEP APNEA 02/01/2007  . HYPERTENSION 11/12/2006  . IBS (irritable bowel syndrome)   . MITRAL VALVE PROLAPSE, HX OF 10/07/2008  . NEOPLASM, MALIGNANT, BREAST, RIGHT 02/15/2008  . OSTEOPOROSIS 04/04/2008  . Pulmonary fibrosis ([place])   . Raynaud's syndrome 04/25/2009  . Sleep apnea   . Tortuous colon   . UTI (urinary tract infection)     Patient Active Problem List   Diagnosis Date Noted  .  DNR (do not resuscitate) 07/16/2016  . DNR (do not resuscitate) 07/16/2016  . Goals of care, counseling/discussion   . Acute diverticulitis 04/29/2016  . Palliative care encounter   . Abdominal pain 04/28/2016  . Generalized weakness 04/28/2016  . Acute pulmonary embolism ([place]) 04/28/2016  . Diverticulitis 04/13/2016  . Diastolic dysfunction 70/62/3762  . Abdominal aortic atherosclerosis ([place]) 03/08/2016  . Syncope 02/23/2016  . Allergic rhinitis 04/12/2014  . Constipation 04/12/2014  . Alzheimer's dementia 04/12/2014  . Osteoarthritis, knee 04/12/2014  . Insomnia 11/21/2013  . History of CVA (cerebrovascular accident) 07/29/2013  . Pulmonary nodules 07/29/2013  . Recurrent UTI 07/13/2013  . History of breast cancer, T1b, N0, Lumpectomy 07/21/2007. 04/21/2011  . Chest pain, atypical 01/22/2011  . COPD with emphysema [place] 01/04/2010  . Raynaud's syndrome 04/25/2009  . GERD 04/19/2007  . Essential hypertension 11/12/2006    Past Surgical History:  Procedure Laterality Date  . ABDOMINAL HYSTERECTOMY    . APPENDECTOMY    . BILATERAL SALPINGOOPHORECTOMY     s/p prolapsed bladder  . BLADDER SURGERY     prolapsed  . BREAST CYST EXCISION Left   . BREAST SURGERY  07-21-07   mastectomy (right), all margins neg and LNs neg   . EYE SURGERY     cataract removal bilateral  . LUNG SURGERY    . MASTECTOMY Right 07/21/2007   all margins neg  and LNs neg   . MELANOMA EXCISION     rt clavicle Dr Rhoder[facility] office)  . MOUTH SURGERY    . SHOULDER OPEN ROTATOR CUFF REPAIR Right   . THORACOTOMY Left    granuloma, pulmonary-thoracotomy  . TONSILLECTOMY      OB History    No data available       Home Medications    Prior to Admission medications   Medication Sig Start Date End Date Taking? Authorizing Provider  fluticasone (FLONASE) 50 MCG/ACT nasal spray Place 2 sprays into both nostrils daily. Patient taking differently: Place 2 sprays into both nostrils 2 (two) times daily.   11/19/15  Yes Kordsmeier, Gregary Signs, FNP  mirtazapine (REMERON) 7.5 MG tablet Take 1 tablet (7.5 mg total) by mouth at bedtime. Patient taking differently: Take 15 mg by mouth at bedtime.  07/04/16  Yes Marin Olp, MD  senna-docusate (SENOKOT-S) 8.6-50 MG tablet Take 1 tablet by mouth daily.   Yes [provider]  acetaminophen (TYLENOL) 325 MG tablet Take 2 tablets (650 mg total) by mouth every 6 (six) hours as needed for mild pain (or Fever >/= 101). Patient not taking: Reported on 06/06/2017 05/02/16   Cristal Ford, DO  albuterol (PROVENTIL HFA;VENTOLIN HFA) 108 [phone] Base) MCG/ACT inhaler Inhale 2 puffs into the lungs every 6 (six) hours as needed for wheezing or shortness of breath. Patient not taking: Reported on 06/06/2017 11/23/15   Dorothyann Peng, NP  apixaban (ELIQUIS) 5 MG TABS tablet Take 1 tablet (5 mg total) by mouth 2 (two) times daily. Start on 05/05/2016 Patient not taking: Reported on 06/06/2017 07/25/16   Marin Olp, MD  budesonide-formoterol [facility] (2-Rh)) 160-4.5 MCG/ACT inhaler Inhale 2 puffs into the lungs 2 (two) times daily. Patient not taking: Reported on 06/06/2017 02/19/16   Marin Olp, MD  diazepam (VALIUM) 5 MG tablet TAKE 1 TABLET BY MOUTH AT BEDTIME Patient not taking: Reported on 06/06/2017 07/01/16   Marin Olp, MD  nitroGLYCERIN (NITROSTAT) 0.4 MG SL tablet Place 1 tablet (0.4 mg total) under the tongue every 5 (five) minutes as needed for chest pain (take as directed). Patient not taking: Reported on 06/06/2017 04/12/15   Fay Records, MD  ondansetron (ZOFRAN) 4 MG tablet TAKE 1 TABLET (4 MG TOTAL) BY MOUTH EVERY 8 (EIGHT) HOURS AS NEEDED FOR NAUSEA OR VOMITING. Patient not taking: Reported on 06/06/2017 07/01/16   Marin Olp, MD    Family History Family History  Problem Relation Age of Onset  . Parkinsonism Father   . Coronary artery disease Father   . Stroke Mother   . Heart failure Mother   . Breast cancer Unknown         aunt  . Colon cancer Unknown        aunt, age 28  . Uterine cancer Unknown        aunt  . Breast cancer Maternal Aunt   . Colon cancer Maternal Aunt   . Uterine cancer Maternal Aunt   . Lung disease Neg Hx     Social History Social History   Tobacco Use  . Smoking status: Former Smoker    Packs/day: 2.00    Years: 30.00    Pack years: 60.00    Types: Cigarettes    Last attempt to quit: 05/13/1983    Years since quitting: 34.0  . Smokeless tobacco: Never Used  Substance Use Topics  . Alcohol use: Yes    Alcohol/week: 0.0 oz  Comment: 1-2 glaases wine per day  . Drug use: No     Allergies   Azithromycin; Ciprofloxacin; Levaquin [levofloxacin]; Penicillins; and Sulfamethoxazole   Review of Systems Review of Systems  Respiratory: Negative for shortness of breath.   Cardiovascular: Negative for chest pain.  Gastrointestinal: Negative for abdominal pain.  Musculoskeletal: Positive for arthralgias and joint swelling.  Neurological: Negative for headaches.  All other systems reviewed and are negative.    Physical Exam Updated Vital Signs BP (!) 164/72   Pulse 70   Temp 97.8 F (36.6 C) (Oral)   Resp 16   SpO2 100%   Physical Exam  Constitutional: She appears well-developed and well-nourished. No distress.  HENT:  Head: Normocephalic and atraumatic.  Right Ear: External ear normal.  Left Ear: External ear normal.  Nose: Nose normal.  Eyes: Conjunctivae are normal. Pupils are equal, round, and reactive to light.  Neck: Normal range of motion. Neck supple.  Cardiovascular: Normal rate, regular rhythm, normal heart sounds and intact distal pulses.  Pulmonary/Chest: Effort normal and breath sounds normal. No stridor. She has no wheezes. She has no rales.  Abdominal: Soft. Bowel sounds are normal. She exhibits no mass. There is no tenderness. There is no rebound and no guarding.  Musculoskeletal: She exhibits deformity.       Right hip: She exhibits tenderness,  bony tenderness and deformity. She exhibits no crepitus.       Right knee: Normal.       Right ankle: Normal. Achilles tendon normal.  Neurological: She is alert.  Skin: Skin is warm and dry. Capillary refill takes less than 2 seconds.  Psychiatric: She has a normal mood and affect.     ED Treatments / Results  Labs (all labs ordered are listed, but only abnormal results are displayed) Results for orders placed or performed during the hospital encounter of 06/06/17  CBC with Differential/Platelet  Result Value Ref Range   WBC 14.3 (H) 4.0 - 10.5 K/uL   RBC 3.87 3.87 - 5.11 MIL/uL   Hemoglobin 11.4 (L) 12.0 - 15.0 g/dL   HCT 34.5 (L) 36.0 - 46.0 %   MCV 89.1 78.0 - 100.0 fL   MCH 29.5 26.0 - 34.0 pg   MCHC 33.0 30.0 - 36.0 g/dL   RDW 13.8 11.5 - 15.5 %   Platelets 174 150 - 400 K/uL   Neutrophils Relative % 86 %   Neutro Abs 12.4 (H) 1.7 - 7.7 K/uL   Lymphocytes Relative 8 %   Lymphs Abs 1.1 0.7 - 4.0 K/uL   Monocytes Relative 6 %   Monocytes Absolute 0.8 0.1 - 1.0 K/uL   Eosinophils Relative 0 %   Eosinophils Absolute 0.0 0.0 - 0.7 K/uL   Basophils Relative 0 %   Basophils Absolute 0.0 0.0 - 0.1 K/uL  I-Stat Chem 8, ED  Result Value Ref Range   Sodium 139 135 - 145 mmol/L   Potassium 3.8 3.5 - 5.1 mmol/L   Chloride 100 (L) 101 - 111 mmol/L   BUN 19 6 - 20 mg/dL   Creatinine, Ser 0.70 0.44 - 1.00 mg/dL   Glucose, Bld 152 (H) 65 - 99 mg/dL   Calcium, Ion 1.12 (L) 1.15 - 1.40 mmol/L   TCO2 30 22 - 32 mmol/L   Hemoglobin 11.9 (L) 12.0 - 15.0 g/dL   HCT 35.0 (L) 36.0 - 46.0 %   Dg Chest 2 View  Result Date: 06/06/2017 CLINICAL DATA:  Right hip swelling and pain after  a fall. History of COPD, emphysema, hypertension, atrial fibrillation, former smoker. EXAM: CHEST  2 VIEW COMPARISON:  CT chest 04/25/2016.  Chest radiograph 04/24/2016. FINDINGS: Hyperinflation consistent with emphysema. Interstitial fibrosis and bronchial wall thickening consistent with chronic bronchitis.  Left upper lung nodule measuring 1.7 cm diameter. This is shown to represent a calcified granuloma on the previous CT. No focal consolidation or airspace disease. Cardiac enlargement. No vascular congestion or edema. Compression of midthoracic vertebrae similar to prior study. Calcification of the aorta. Postoperative right mastectomy with surgical clips in the right axilla. IMPRESSION: Emphysematous and chronic bronchitic changes in the lungs. Stable appearance of a left lung nodule consistent with calcified granuloma. Cardiac enlargement. No focal consolidation or edema. Aortic atherosclerosis. Electronically Signed   By: Lucienne Capers M.D.   On: 06/06/2017 02:12   Dg Hip Unilat W Or Wo Pelvis 2-3 Views Right  Result Date: 06/06/2017 CLINICAL DATA:  Right hip pain after a fall. EXAM: DG HIP (WITH OR WITHOUT PELVIS) 2-3V RIGHT COMPARISON:  None. FINDINGS: Inter trochanteric fracture of the proximal right femur with mild varus angulation. No significant displacement. Mild impaction. No dislocation of the right hip. Degenerative changes are seen in the lower lumbar spine and in both hips. Pelvis appears intact. Vascular calcifications are present. IMPRESSION: Inter trochanteric fracture of the right proximal femur with mild impaction and varus angulation. Electronically Signed   By: Lucienne Capers M.D.   On: 06/06/2017 02:13     Radiology Dg Chest 2 View  Result Date: 06/06/2017 CLINICAL DATA:  Right hip swelling and pain after a fall. History of COPD, emphysema, hypertension, atrial fibrillation, former smoker. EXAM: CHEST  2 VIEW COMPARISON:  CT chest 04/25/2016.  Chest radiograph 04/24/2016. FINDINGS: Hyperinflation consistent with emphysema. Interstitial fibrosis and bronchial wall thickening consistent with chronic bronchitis. Left upper lung nodule measuring 1.7 cm diameter. This is shown to represent a calcified granuloma on the previous CT. No focal consolidation or airspace disease. Cardiac  enlargement. No vascular congestion or edema. Compression of midthoracic vertebrae similar to prior study. Calcification of the aorta. Postoperative right mastectomy with surgical clips in the right axilla. IMPRESSION: Emphysematous and chronic bronchitic changes in the lungs. Stable appearance of a left lung nodule consistent with calcified granuloma. Cardiac enlargement. No focal consolidation or edema. Aortic atherosclerosis. Electronically Signed   By: Lucienne Capers M.D.   On: 06/06/2017 02:12   Dg Hip Unilat W Or Wo Pelvis 2-3 Views Right  Result Date: 06/06/2017 CLINICAL DATA:  Right hip pain after a fall. EXAM: DG HIP (WITH OR WITHOUT PELVIS) 2-3V RIGHT COMPARISON:  None. FINDINGS: Inter trochanteric fracture of the proximal right femur with mild varus angulation. No significant displacement. Mild impaction. No dislocation of the right hip. Degenerative changes are seen in the lower lumbar spine and in both hips. Pelvis appears intact. Vascular calcifications are present. IMPRESSION: Inter trochanteric fracture of the right proximal femur with mild impaction and varus angulation. Electronically Signed   By: Lucienne Capers M.D.   On: 06/06/2017 02:13    Procedures Procedures (including critical care time)  Medications Ordered in ED Medications - No data to display   Case D/w Dr. Wynelle Link admit to medicine   Final Clinical Impressions(s) / ED Diagnoses   Final diagnoses:  Closed fracture of right hip, initial encounter [facility])    Admit to medicine    Vira Chaplin, MD 06/06/17 0300

## 2017-06-06 NOTE — Anesthesia Preprocedure Evaluation (Addendum)
Anesthesia Evaluation  Patient identified by MRN, date of birth, ID band Patient awake    Reviewed: Allergy & Precautions, NPO status , Patient's Chart, lab work & pertinent test results  History of Anesthesia Complications (+) DIFFICULT AIRWAY and history of anesthetic complications  Airway Mallampati: IV  TM Distance: <3 FB     Dental no notable dental hx.    Pulmonary neg pulmonary ROS, COPD, former smoker,    Pulmonary exam normal        Cardiovascular hypertension, + Peripheral Vascular Disease  negative cardio ROS   Rhythm:Regular Rate:Normal  Echo 12/17 EF 60-65%   Neuro/Psych CVA negative neurological ROS  negative psych ROS   GI/Hepatic negative GI ROS, Neg liver ROS, GERD  ,  Endo/Other  negative endocrine ROS  Renal/GU negative Renal ROS  negative genitourinary   Musculoskeletal negative musculoskeletal ROS (+)   Abdominal   Peds  Hematology negative hematology ROS (+)   Anesthesia Other Findings  She had severe subpharyngeal stenosis ( 5 mm) and has been labeled as a difficult airway, her last surgery was on her breast 8 years ago under [facility].     Reproductive/Obstetrics                          Lab Results  Component Value Date   WBC 14.3 (H) 06/06/2017   HGB 11.9 (L) 06/06/2017   HCT 35.0 (L) 06/06/2017   MCV 89.1 06/06/2017   PLT 174 06/06/2017   Lab Results  Component Value Date   INR 0.9 07/19/2007   Lab Results  Component Value Date   CREATININE 0.70 06/06/2017   BUN 19 06/06/2017   NA 139 06/06/2017   K 3.8 06/06/2017   CL 100 (L) 06/06/2017   CO2 31 06/02/2016      Anesthesia Physical Anesthesia Plan  ASA: IV  Anesthesia Plan: Spinal   Post-op Pain Management:    Induction:   PONV Risk Score and Plan: Treatment may vary due to age or medical condition  Airway Management Planned: Mask, Natural Airway and Nasal Cannula  Additional  Equipment:   Intra-op Plan:   Post-operative Plan:   Informed Consent: I have reviewed the patients History and Physical, chart, labs and discussed the procedure including the risks, benefits and alternatives for the proposed anesthesia with the patient or authorized representative who has indicated his/her understanding and acceptance.   Dental advisory given  Plan Discussed with: CRNA and Anesthesiologist  Anesthesia Plan Comments:       Anesthesia Quick Evaluation

## 2017-06-06 NOTE — H&P (View-Only) (Signed)
Reason for Consult:  Right hip pain  Referring Physician: EDP  [name] [name] is an 84 y.o. female.  HPI: [place]. Right hip fracture  Past Medical History:  Diagnosis Date  . Airway obstruction   . Allergic rhinitis   . Anal stricture   . ANXIETY 08/16/2008  . Atrial fibrillation ([place])    she denies known hx of afib 02/24/13  . CAD, UNSPECIFIED SITE 10/17/2008  . Candida esophagitis ([place])   . CHOLELITHIASIS 08/23/2008  . Chronic constipation   . Colitis    sigmoid  . Complication of anesthesia    difficult intubation  . COPD 01/04/2010   FeV1 101%, DLCO64% 2008  . COPD (chronic obstructive pulmonary disease) ([place]) 01/04/2010  . 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 Diverticulitis   . DIVERTICULOSIS-COLON 04/07/2008  . Emphysema of lung ([place])   . Esophageal stricture   . Fatty liver   . GERD 04/19/2007  . Hearing aid worn    bilateral  . Hiatal hernia   . History of diverticulitis of colon   . HYPERSOMNIA, ASSOCIATED WITH SLEEP APNEA 02/01/2007  . HYPERTENSION 11/12/2006  . IBS (irritable bowel syndrome)   . MITRAL VALVE PROLAPSE, HX OF 10/07/2008  . NEOPLASM, MALIGNANT, BREAST, RIGHT 02/15/2008  . OSTEOPOROSIS 04/04/2008  . Pulmonary fibrosis ([place])   . Raynaud's syndrome 04/25/2009  . Sleep apnea   . Tortuous colon   . UTI (urinary tract infection)     Past Surgical History:  Procedure Laterality Date  . ABDOMINAL HYSTERECTOMY    . APPENDECTOMY    . BILATERAL SALPINGOOPHORECTOMY     s/p prolapsed bladder  . BLADDER SURGERY     prolapsed  . BREAST CYST EXCISION Left   . BREAST SURGERY  07-21-07   mastectomy (right), all margins neg and LNs neg   . EYE SURGERY     cataract removal bilateral  . LUNG SURGERY    . MASTECTOMY Right 07/21/2007   all margins neg and LNs neg   . MELANOMA EXCISION     rt clavicle Dr Rhoderick [facility] office)  . MOUTH SURGERY    . SHOULDER OPEN ROTATOR CUFF REPAIR Right   . THORACOTOMY Left    granuloma, pulmonary-thoracotomy  .  TONSILLECTOMY      Family History  Problem Relation Age of Onset  . Parkinsonism Father   . Coronary artery disease Father   . Stroke Mother   . Heart failure Mother   . Breast cancer Unknown        aunt  . Colon cancer Unknown        aunt, age 68  . Uterine cancer Unknown        aunt  . Breast cancer Maternal Aunt   . Colon cancer Maternal Aunt   . Uterine cancer Maternal Aunt   . Lung disease Neg Hx     Social History:  reports that she quit smoking about 34 years ago. Her smoking use included cigarettes. She has a 60.00 pack-year smoking history. she has never used smokeless tobacco. She reports that she drinks alcohol. She reports that she does not use drugs.  Allergies:  Allergies  Allergen Reactions  . Azithromycin Hives, Diarrhea, Dermatitis and Rash  . Ciprofloxacin Swelling  . Levaquin [Levofloxacin] Hives  . Penicillins Hives    has had mild doses that she did not have reactions to  Has patient had a PCN reaction causing immediate rash, facial/tongue/throat swelling, SOB or lightheadedness with hypotension:Yes Has patient had a PCN  reaction causing severe rash involving mucus membranes or skin necrosis: No Has patient had a PCN reaction that required hospitalization: no Has patient had a PCN reaction occurring within the last 10 years: no If all of the above answers are "NO", then may proceed with Cephalosporin use.   . Sulfamethoxazole Nausea And Vomiting    Medications: I have reviewed the patient's current medications.  Results for orders placed or performed during the hospital encounter of 06/06/17 (from the past 48 hour(s))  CBC with Differential/Platelet     Status: Abnormal   Collection Time: 06/06/17  1:31 AM  Result Value Ref Range   WBC 14.3 (H) 4.0 - 10.5 K/uL   RBC 3.87 3.87 - 5.11 MIL/uL   Hemoglobin 11.4 (L) 12.0 - 15.0 g/dL   HCT 34.5 (L) 36.0 - 46.0 %   MCV 89.1 78.0 - 100.0 fL   MCH 29.5 26.0 - 34.0 pg   MCHC 33.0 30.0 - 36.0 g/dL   RDW  13.8 11.5 - 15.5 %   Platelets 174 150 - 400 K/uL   Neutrophils Relative % 86 %   Neutro Abs 12.4 (H) 1.7 - 7.7 K/uL   Lymphocytes Relative 8 %   Lymphs Abs 1.1 0.7 - 4.0 K/uL   Monocytes Relative 6 %   Monocytes Absolute 0.8 0.1 - 1.0 K/uL   Eosinophils Relative 0 %   Eosinophils Absolute 0.0 0.0 - 0.7 K/uL   Basophils Relative 0 %   Basophils Absolute 0.0 0.0 - 0.1 K/uL  I-Stat Chem 8, ED     Status: Abnormal   Collection Time: 06/06/17  1:39 AM  Result Value Ref Range   Sodium 139 135 - 145 mmol/L   Potassium 3.8 3.5 - 5.1 mmol/L   Chloride 100 (L) 101 - 111 mmol/L   BUN 19 6 - 20 mg/dL   Creatinine, Ser 0.70 0.44 - 1.00 mg/dL   Glucose, Bld 152 (H) 65 - 99 mg/dL   Calcium, Ion 1.12 (L) 1.15 - 1.40 mmol/L   TCO2 30 22 - 32 mmol/L   Hemoglobin 11.9 (L) 12.0 - 15.0 g/dL   HCT 35.0 (L) 36.0 - 46.0 %    Dg Chest 2 View  Result Date: 06/06/2017 CLINICAL DATA:  Right hip swelling and pain after a fall. History of COPD, emphysema, hypertension, atrial fibrillation, former smoker. EXAM: CHEST  2 VIEW COMPARISON:  CT chest 04/25/2016.  Chest radiograph 04/24/2016. FINDINGS: Hyperinflation consistent with emphysema. Interstitial fibrosis and bronchial wall thickening consistent with chronic bronchitis. Left upper lung nodule measuring 1.7 cm diameter. This is shown to represent a calcified granuloma on the previous CT. No focal consolidation or airspace disease. Cardiac enlargement. No vascular congestion or edema. Compression of midthoracic vertebrae similar to prior study. Calcification of the aorta. Postoperative right mastectomy with surgical clips in the right axilla. IMPRESSION: Emphysematous and chronic bronchitic changes in the lungs. Stable appearance of a left lung nodule consistent with calcified granuloma. Cardiac enlargement. No focal consolidation or edema. Aortic atherosclerosis. Electronically Signed   By: Lucienne Capers M.D.   On: 06/06/2017 02:12   Dg Hip Unilat W Or Wo  Pelvis 2-3 Views Right  Result Date: 06/06/2017 CLINICAL DATA:  Right hip pain after a fall. EXAM: DG HIP (WITH OR WITHOUT PELVIS) 2-3V RIGHT COMPARISON:  None. FINDINGS: Inter trochanteric fracture of the proximal right femur with mild varus angulation. No significant displacement. Mild impaction. No dislocation of the right hip. Degenerative changes are seen in the lower lumbar  spine and in both hips. Pelvis appears intact. Vascular calcifications are present. IMPRESSION: Inter trochanteric fracture of the right proximal femur with mild impaction and varus angulation. Electronically Signed   By: Lucienne Capers M.D.   On: 06/06/2017 02:13    Review of Systems  Respiratory: Positive for shortness of breath.   Musculoskeletal: Positive for joint pain.   Blood pressure (!) 153/65, pulse 78, temperature 99 F (37.2 C), temperature source Oral, resp. rate 15, height 5\' 2"  (1.575 m), weight 40.8 kg (90 lb), SpO2 90 %. Physical Exam  Vitals reviewed. Constitutional: She is oriented to person, place, and time. She appears well-developed.  HENT:  Head: Normocephalic.  Eyes: Pupils are equal, round, and reactive to light.  Neck: Normal range of motion.  Cardiovascular: Normal rate.  Respiratory: Effort normal.  GI: Soft.  Musculoskeletal: She exhibits tenderness and deformity.  Right LE slightly short with ER. NVI. Compartments soft. Good capillary refill. No DVT  Neurological: She is alert and oriented to person, place, and time.  Skin: Skin is warm and dry.  Psychiatric: She has a normal mood and affect.    Assessment/Plan:  Right intertrochanteric hip fracture. COPD. Plan IM nail following clearance.  Gaither Biehn C 06/06/2017, 7:32 AM

## 2017-06-06 NOTE — ED Triage Notes (Signed)
Pt BIB [facility]. Pt got up from bed to use the restroom and was not using her walker. She fell from a standing position. Pt is not on blood thinners. No signs of trauma on the pts head. Pt has swelling on the Right hip, PMS in tact, pt remembers the entire event, CAOx4. EMS administered 41mcg of fentanyl and 4mg  of zofran IV en route. Pt o2 was initially 98 after fentanyl the pt spo2 dropped to low 80s 4 L [place] placed and pt sats rose to 100%. Hx of alzheimer's.

## 2017-06-06 NOTE — ED Notes (Signed)
ED TO INPATIENT HANDOFF REPORT  Name/Age/Gender Dow [name] [name] 84 y.o. female  Code Status Code Status History    Date Active Date Inactive Code Status Order ID Comments User Context   04/28/2016 14:19 05/02/2016 17:59 Full Code [id number]  Waldemar Dickens, MD Inpatient   04/13/2016 17:20 04/14/2016 18:11 Full Code [id number]  Debbe Odea, MD ED   02/23/2016 05:42 02/26/2016 19:06 Full Code [id number]  Edwin Dada, MD Inpatient   07/25/2014 21:10 07/26/2014 18:56 Full Code [id number]  Etta Quill, DO ED   11/16/2013 21:51 11/20/2013 17:27 Full Code [id number]  Allyne Gee, MD ED   08/09/2013 10:36 11/16/2013 21:51 Full Code [id number]  Hennie Duos, MD Outpatient   07/29/2013 23:38 08/09/2013 10:36 Full Code [id number]  Hennie Duos, MD Outpatient   07/24/2013 21:23 07/27/2013 18:10 Full Code [id number]  Rise Patience, MD Inpatient   07/16/2013 17:16 07/18/2013 19:44 Full Code [id number]  Robbie Lis, MD Inpatient   07/13/2013 18:35 07/15/2013 17:22 Full Code [id number]  Hosie Poisson, MD Inpatient   03/30/2013 12:56 04/01/2013 21:11 Full Code [id number]  Elsie Stain, MD Inpatient   03/14/2013 21:10 03/17/2013 20:33 Full Code [id number]  Aldean Jewett, MD Inpatient    Advance Directive Documentation     Most Recent Value  Type of Advance Directive  Out of facility DNR (pink MOST or yellow form)  Pre-existing out of facility DNR order (yellow form or pink MOST form)  Pink MOST form placed in chart (order not valid for inpatient use), Yellow form placed in chart (order not valid for inpatient use)  "MOST" Form in Place?  No data      Home/SNF/Other Rehab  Chief Complaint Fall  Level of Care/Admitting Diagnosis ED Disposition    ED Disposition Condition Comment   Admit  Hospital Area: [facility] [[id number]]  Level of Care: Med-Surg [16]  Diagnosis: Closed right hip fracture, initial encounter [facility]) [[id number]]  Admitting Physician: Rise Patience  [phone]  Attending Physician: Rise Patience [phone]  Estimated length of stay: past midnight tomorrow  Certification:: I certify this patient will need inpatient services for at least 2 midnights  PT Class (Do Not Modify): Inpatient [101]  PT Acc Code (Do Not Modify): Private [1]       Medical History Past Medical History:  Diagnosis Date  . Airway obstruction   . Allergic rhinitis   . Anal stricture   . ANXIETY 08/16/2008  . Atrial fibrillation ([place])    she denies known hx of afib 02/24/13  . CAD, UNSPECIFIED SITE 10/17/2008  . Candida esophagitis ([place])   . CHOLELITHIASIS 08/23/2008  . Chronic constipation   . Colitis    sigmoid  . Complication of anesthesia    difficult intubation  . COPD 01/04/2010   FeV1 101%, DLCO64% 2008  . COPD (chronic obstructive pulmonary disease) ([place]) 01/04/2010  . 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 Diverticulitis   . DIVERTICULOSIS-COLON 04/07/2008  . Emphysema of lung ([place])   . Esophageal stricture   . Fatty liver   . GERD 04/19/2007  . Hearing aid worn    bilateral  . Hiatal hernia   . History of diverticulitis of colon   . HYPERSOMNIA, ASSOCIATED WITH SLEEP APNEA 02/01/2007  . HYPERTENSION 11/12/2006  . IBS (irritable bowel syndrome)   . MITRAL VALVE PROLAPSE, HX OF 10/07/2008  . NEOPLASM, MALIGNANT, BREAST, RIGHT 02/15/2008  . OSTEOPOROSIS 04/04/2008  . Pulmonary fibrosis ([place])   . Raynaud's  syndrome 04/25/2009  . Sleep apnea   . Tortuous colon   . UTI (urinary tract infection)     Allergies Allergies  Allergen Reactions  . Azithromycin Hives, Diarrhea, Dermatitis and Rash  . Ciprofloxacin Swelling  . Levaquin [Levofloxacin] Hives  . Penicillins Hives    has had mild doses that she did not have reactions to  Has patient had a PCN reaction causing immediate rash, facial/tongue/throat swelling, SOB or lightheadedness with hypotension:Yes Has patient had a PCN reaction causing severe rash involving mucus membranes  or skin necrosis: No Has patient had a PCN reaction that required hospitalization: no Has patient had a PCN reaction occurring within the last 10 years: no If all of the above answers are "NO", then may proceed with Cephalosporin use.   . Sulfamethoxazole Nausea And Vomiting    IV Location/Drains/Wounds Patient Lines/Drains/Airways Status   Active Line/Drains/Airways    Name:   Placement date:   Placement time:   Site:   Days:   Peripheral IV 06/06/17 Left Antecubital   06/06/17    -    Antecubital   less than 1          Labs/Imaging Results for orders placed or performed during the hospital encounter of 06/06/17 (from the past 48 hour(s))  CBC with Differential/Platelet     Status: Abnormal   Collection Time: 06/06/17  1:31 AM  Result Value Ref Range   WBC 14.3 (H) 4.0 - 10.5 K/uL   RBC 3.87 3.87 - 5.11 MIL/uL   Hemoglobin 11.4 (L) 12.0 - 15.0 g/dL   HCT 34.5 (L) 36.0 - 46.0 %   MCV 89.1 78.0 - 100.0 fL   MCH 29.5 26.0 - 34.0 pg   MCHC 33.0 30.0 - 36.0 g/dL   RDW 13.8 11.5 - 15.5 %   Platelets 174 150 - 400 K/uL   Neutrophils Relative % 86 %   Neutro Abs 12.4 (H) 1.7 - 7.7 K/uL   Lymphocytes Relative 8 %   Lymphs Abs 1.1 0.7 - 4.0 K/uL   Monocytes Relative 6 %   Monocytes Absolute 0.8 0.1 - 1.0 K/uL   Eosinophils Relative 0 %   Eosinophils Absolute 0.0 0.0 - 0.7 K/uL   Basophils Relative 0 %   Basophils Absolute 0.0 0.0 - 0.1 K/uL  I-Stat Chem 8, ED     Status: Abnormal   Collection Time: 06/06/17  1:39 AM  Result Value Ref Range   Sodium 139 135 - 145 mmol/L   Potassium 3.8 3.5 - 5.1 mmol/L   Chloride 100 (L) 101 - 111 mmol/L   BUN 19 6 - 20 mg/dL   Creatinine, Ser 0.70 0.44 - 1.00 mg/dL   Glucose, Bld 152 (H) 65 - 99 mg/dL   Calcium, Ion 1.12 (L) 1.15 - 1.40 mmol/L   TCO2 30 22 - 32 mmol/L   Hemoglobin 11.9 (L) 12.0 - 15.0 g/dL   HCT 35.0 (L) 36.0 - 46.0 %   Dg Chest 2 View  Result Date: 06/06/2017 CLINICAL DATA:  Right hip swelling and pain after a fall.  History of COPD, emphysema, hypertension, atrial fibrillation, former smoker. EXAM: CHEST  2 VIEW COMPARISON:  CT chest 04/25/2016.  Chest radiograph 04/24/2016. FINDINGS: Hyperinflation consistent with emphysema. Interstitial fibrosis and bronchial wall thickening consistent with chronic bronchitis. Left upper lung nodule measuring 1.7 cm diameter. This is shown to represent a calcified granuloma on the previous CT. No focal consolidation or airspace disease. Cardiac enlargement. No vascular congestion or edema.  Compression of midthoracic vertebrae similar to prior study. Calcification of the aorta. Postoperative right mastectomy with surgical clips in the right axilla. IMPRESSION: Emphysematous and chronic bronchitic changes in the lungs. Stable appearance of a left lung nodule consistent with calcified granuloma. Cardiac enlargement. No focal consolidation or edema. Aortic atherosclerosis. Electronically Signed   By: Lucienne Capers M.D.   On: 06/06/2017 02:12   Dg Hip Unilat W Or Wo Pelvis 2-3 Views Right  Result Date: 06/06/2017 CLINICAL DATA:  Right hip pain after a fall. EXAM: DG HIP (WITH OR WITHOUT PELVIS) 2-3V RIGHT COMPARISON:  None. FINDINGS: Inter trochanteric fracture of the proximal right femur with mild varus angulation. No significant displacement. Mild impaction. No dislocation of the right hip. Degenerative changes are seen in the lower lumbar spine and in both hips. Pelvis appears intact. Vascular calcifications are present. IMPRESSION: Inter trochanteric fracture of the right proximal femur with mild impaction and varus angulation. Electronically Signed   By: Lucienne Capers M.D.   On: 06/06/2017 02:13    Pending Labs Unresulted Labs (From admission, onward)   Start     Ordered   06/06/17 0313  Urinalysis, Routine w reflex microscopic  Once,   R     06/06/17 0313      Vitals/Pain Today's Vitals   06/06/17 0056 06/06/17 0100 06/06/17 0200 06/06/17 0400  BP:   (!) 164/72 (!)  159/71  Pulse:   70 79  Resp:   16 16  Temp:      TempSrc:      SpO2:   100% 99%  PainSc: 8  10-Worst pain ever      Isolation Precautions No active isolations  Medications Medications - No data to display  Mobility non-ambulatory uses wheelchair 90% of the time.

## 2017-06-06 NOTE — Plan of Care (Signed)
  Safety: Ability to remain free from injury will improve 06/06/2017 2027 - Progressing by Mickie Kay, RN

## 2017-06-06 NOTE — Consult Note (Signed)
Name: [name] [name] MRN: [id number] DOB: 1923/06/11    ADMISSION DATE:  06/06/2017 CONSULTATION DATE:  06/06/2017  REFERRING MD :  Hartford Poli  CHIEF COMPLAINT:  Fall, hip fracture, pre-op   HISTORY OF PRESENT ILLNESS: 84 year old nursing home resident admitted after an accidental fall and right hip fracture.  We are consulted for preop pulmonary clearance  I have reviewed all her prior records and obtain history mostly am speaking to her daughter who was able to provide details.  She used to see my partner Dr. Joya Gaskins and has last seen Dr. Ashok Cordia on 09/2015 She smoked for less than 10 pack years. PFTs 09/2015 showed no evidence of airway obstruction with FEV1 of 1.34-103% with a ratio of 80.  It was felt that her prior symptoms may be related to asthma.  She was maintained on 2 L oxygen up until then but nocturnal oximetry did not show any desaturations and she was taken off oxygen and has done well since.  She has only needed albuterol on a as needed basis and not any in the last year. CT chest from 09/2015 was reviewed which shows mild to seem.  She has a  history of thoracotomy for lung nodule which was benign.  She may have had a mycobacterial infection then.  She had severe subpharyngeal stenosis and has been labeled as a difficult airway, her last surgery was on her breast 8 years ago  She was on hospice about 2 years ago but did well and came off in 3 months.  She has reasonable quality of life and transfers from bed to wheelchair and takes a few steps  PAST MEDICAL HISTORY :   has a past medical history of Airway obstruction, Allergic rhinitis, Anal stricture, ANXIETY (08/16/2008), Atrial fibrillation ([place]), CAD, UNSPECIFIED SITE (10/17/2008), Candida esophagitis ([place]), CHOLELITHIASIS (08/23/2008), Chronic constipation, Colitis, Complication of anesthesia, COPD (01/04/2010), COPD (chronic obstructive pulmonary disease) ([place]) (01/04/2010), DISEASE, PULMONARY D/T MYCOBACTERIA (02/01/2007),  Diverticulitis, DIVERTICULOSIS-COLON (04/07/2008), Emphysema of lung ([place]), Esophageal stricture, Fatty liver, GERD (04/19/2007), Hearing aid worn, Hiatal hernia, History of diverticulitis of colon, HYPERSOMNIA, ASSOCIATED WITH SLEEP APNEA (02/01/2007), HYPERTENSION (11/12/2006), IBS (irritable bowel syndrome), MITRAL VALVE PROLAPSE, HX OF (10/07/2008), NEOPLASM, MALIGNANT, BREAST, RIGHT (02/15/2008), OSTEOPOROSIS (04/04/2008), Pulmonary fibrosis ([place]), Raynaud's syndrome (04/25/2009), Sleep apnea, Tortuous colon, and UTI (urinary tract infection).  has a past surgical history that includes Bilateral salpingoophorectomy; Bladder surgery; Thoracotomy (Left); Appendectomy; Tonsillectomy; Mouth surgery; Melanoma excision; Lung surgery; Breast surgery (07-21-07); Breast cyst excision (Left); Mastectomy (Right, 07/21/2007); Shoulder open rotator cuff repair (Right); Abdominal hysterectomy; and Eye surgery. Prior to Admission medications   Medication Sig Start Date End Date Taking? Authorizing Provider  fluticasone (FLONASE) 50 MCG/ACT nasal spray Place 2 sprays into both nostrils daily. Patient taking differently: Place 2 sprays into both nostrils 2 (two) times daily.  11/19/15  Yes Kordsmeier, Gregary Signs, FNP  mirtazapine (REMERON) 7.5 MG tablet Take 1 tablet (7.5 mg total) by mouth at bedtime. Patient taking differently: Take 15 mg by mouth at bedtime.  07/04/16  Yes Marin Olp, MD  senna-docusate (SENOKOT-S) 8.6-50 MG tablet Take 1 tablet by mouth daily.   Yes [provider]  acetaminophen (TYLENOL) 325 MG tablet Take 2 tablets (650 mg total) by mouth every 6 (six) hours as needed for mild pain (or Fever >/= 101). Patient not taking: Reported on 06/06/2017 05/02/16   Cristal Ford, DO  albuterol (PROVENTIL HFA;VENTOLIN HFA) 108 [phone] Base) MCG/ACT inhaler Inhale 2 puffs into the lungs every 6 (six)  hours as needed for wheezing or shortness of breath. Patient not taking: Reported on 06/06/2017 11/23/15    Dorothyann Peng, NP  apixaban (ELIQUIS) 5 MG TABS tablet Take 1 tablet (5 mg total) by mouth 2 (two) times daily. Start on 05/05/2016 Patient not taking: Reported on 06/06/2017 07/25/16   Marin Olp, MD  budesonide-formoterol [facility]) 160-4.5 MCG/ACT inhaler Inhale 2 puffs into the lungs 2 (two) times daily. Patient not taking: Reported on 06/06/2017 02/19/16   Marin Olp, MD  diazepam (VALIUM) 5 MG tablet TAKE 1 TABLET BY MOUTH AT BEDTIME Patient not taking: Reported on 06/06/2017 07/01/16   Marin Olp, MD  nitroGLYCERIN (NITROSTAT) 0.4 MG SL tablet Place 1 tablet (0.4 mg total) under the tongue every 5 (five) minutes as needed for chest pain (take as directed). Patient not taking: Reported on 06/06/2017 04/12/15   Fay Records, MD  ondansetron (ZOFRAN) 4 MG tablet TAKE 1 TABLET (4 MG TOTAL) BY MOUTH EVERY 8 (EIGHT) HOURS AS NEEDED FOR NAUSEA OR VOMITING. Patient not taking: Reported on 06/06/2017 07/01/16   Marin Olp, MD   Allergies  Allergen Reactions  . Azithromycin Hives, Diarrhea, Dermatitis and Rash  . Ciprofloxacin Swelling  . Levaquin [Levofloxacin] Hives  . Penicillins Hives    has had mild doses that she did not have reactions to  Has patient had a PCN reaction causing immediate rash, facial/tongue/throat swelling, SOB or lightheadedness with hypotension:Yes Has patient had a PCN reaction causing severe rash involving mucus membranes or skin necrosis: No Has patient had a PCN reaction that required hospitalization: no Has patient had a PCN reaction occurring within the last 10 years: no If all of the above answers are "NO", then may proceed with Cephalosporin use.   . Sulfamethoxazole Nausea And Vomiting    FAMILY HISTORY:  family history includes Breast cancer in her maternal aunt and unknown relative; Colon cancer in her maternal aunt and unknown relative; Coronary artery disease in her father; Heart failure in her mother; Parkinsonism in her father;  Stroke in her mother; Uterine cancer in her maternal aunt and unknown relative. SOCIAL HISTORY:  reports that she quit smoking about 34 years ago. Her smoking use included cigarettes. She has a 60.00 pack-year smoking history. she has never used smokeless tobacco. She reports that she drinks alcohol. She reports that she does not use drugs.  REVIEW OF SYSTEMS:   Constitutional: Negative for fever, chills, weight loss, malaise/fatigue and diaphoresis.  HENT: Negative for hearing loss, ear pain, nosebleeds, congestion, sore throat, neck pain, tinnitus and ear discharge.   Eyes: Negative for blurred vision, double vision, photophobia, pain, discharge and redness.  Respiratory: Negative for cough, hemoptysis, sputum production, shortness of breath, wheezing and stridor.   Cardiovascular: Negative for chest pain, palpitations, orthopnea, claudication, leg swelling and PND.  Gastrointestinal: Negative for heartburn, nausea, vomiting, abdominal pain, diarrhea, constipation, blood in stool and melena.  Genitourinary: Negative for dysuria, urgency, frequency, hematuria and flank pain.  Skin: Negative for itching and rash.  Neurological: Negative for dizziness, tingling, tremors, sensory change, speech change, focal weakness, seizures, loss of consciousness, weakness and headaches.  Endo/Heme/Allergies: Negative for environmental allergies and polydipsia. Does not bruise/bleed easily.   VITAL SIGNS: Temp:  [97.8 F (36.6 C)-99 F (37.2 C)] 99 F (37.2 C) (01/26 0443) Pulse Rate:  [70-79] 78 (01/26 0443) Resp:  [15-20] 15 (01/26 0443) BP: (153-189)/(65-94) 153/65 (01/26 0443) SpO2:  [90 %-100 %] 90 % (01/26 0443) Weight:  [90 lb (  40.8 kg)] 90 lb (40.8 kg) (01/26 0500)  PHYSICAL EXAMINATION: Gen. Pleasant, thin, frail,, in no distress, anxious affect , repeats herself ENT - no thrush, no post nasal drip Neck: No JVD, no thyromegaly, no carotid bruits Lungs: no use of accessory muscles, no  dullness to percussion, clear without rales or rhonchi  Cardiovascular: Rhythm regular, heart sounds  normal, no murmurs or gallops, no peripheral edema Abdomen: soft and non-tender, no hepatosplenomegaly, BS normal. Musculoskeletal: No deformities, no cyanosis or clubbing Neuro:  alert, non focal   Recent Labs  Lab 06/06/17 0139  NA 139  K 3.8  CL 100*  BUN 19  CREATININE 0.70  GLUCOSE 152*   Recent Labs  Lab 06/06/17 0131 06/06/17 0139  HGB 11.4* 11.9*  HCT 34.5* 35.0*  WBC 14.3*  --   PLT 174  --    Dg Chest 2 View  Result Date: 06/06/2017 CLINICAL DATA:  Right hip swelling and pain after a fall. History of COPD, emphysema, hypertension, atrial fibrillation, former smoker. EXAM: CHEST  2 VIEW COMPARISON:  CT chest 04/25/2016.  Chest radiograph 04/24/2016. FINDINGS: Hyperinflation consistent with emphysema. Interstitial fibrosis and bronchial wall thickening consistent with chronic bronchitis. Left upper lung nodule measuring 1.7 cm diameter. This is shown to represent a calcified granuloma on the previous CT. No focal consolidation or airspace disease. Cardiac enlargement. No vascular congestion or edema. Compression of midthoracic vertebrae similar to prior study. Calcification of the aorta. Postoperative right mastectomy with surgical clips in the right axilla. IMPRESSION: Emphysematous and chronic bronchitic changes in the lungs. Stable appearance of a left lung nodule consistent with calcified granuloma. Cardiac enlargement. No focal consolidation or edema. Aortic atherosclerosis. Electronically Signed   By: Lucienne Capers M.D.   On: 06/06/2017 02:12   Dg Hip Unilat W Or Wo Pelvis 2-3 Views Right  Result Date: 06/06/2017 CLINICAL DATA:  Right hip pain after a fall. EXAM: DG HIP (WITH OR WITHOUT PELVIS) 2-3V RIGHT COMPARISON:  None. FINDINGS: Inter trochanteric fracture of the proximal right femur with mild varus angulation. No significant displacement. Mild impaction. No  dislocation of the right hip. Degenerative changes are seen in the lower lumbar spine and in both hips. Pelvis appears intact. Vascular calcifications are present. IMPRESSION: Inter trochanteric fracture of the right proximal femur with mild impaction and varus angulation. Electronically Signed   By: Lucienne Capers M.D.   On: 06/06/2017 02:13    ASSESSMENT / PLAN:  Preop clearance for hip surgery- She only has history of asthma which seems to be well controlled, she does not have any bronchospasm today.  She may have mycobacteria AVM diagnosed in the past but no progression is noted on her chest x-ray today  As such she is very low risk from a pulmonary standpoint for surgery however most important issue here is that she was labeled as a difficult airway after her prior surgery due to pharyngeal stenosis.  As such I would advise to proceed with surgery if this can be done under spinal anesthesia.  If endotracheal intubation is required, then I would defer to anesthesia to clear her from the standpoint of difficulty only  Discussed with daughter and Dr. Lance Coon CCM available as needed  Kara Mead MD. [facility]. [place] [facility] Pager [phone] If no response call [phone]    06/06/2017, 8:59 AM

## 2017-06-07 ENCOUNTER — Inpatient Hospital Stay (HOSPITAL_COMMUNITY): Payer: Medicare Other | Admitting: Certified Registered Nurse Anesthetist

## 2017-06-07 ENCOUNTER — Inpatient Hospital Stay (HOSPITAL_COMMUNITY): Payer: Medicare Other

## 2017-06-07 ENCOUNTER — Encounter (HOSPITAL_COMMUNITY)
Admission: EM | Disposition: A | Discharge status: Skilled Nursing Facility | Payer: Self-pay | Source: Home / Self Care | Attending: Internal Medicine

## 2017-06-07 ENCOUNTER — Encounter (HOSPITAL_COMMUNITY): Payer: Self-pay | Admitting: Certified Registered Nurse Anesthetist

## 2017-06-07 HISTORY — PX: INTRAMEDULLARY (IM) NAIL INTERTROCHANTERIC: SHX5875

## 2017-06-07 LAB — CBC WITH DIFFERENTIAL/PLATELET
BASOS ABS: 0 10*3/uL (ref 0.0–0.1)
BASOS PCT: 0 %
EOS ABS: 0.1 10*3/uL (ref 0.0–0.7)
EOS PCT: 1 %
HCT: 31 % — ABNORMAL LOW (ref 36.0–46.0)
HEMOGLOBIN: 10.5 g/dL — AB (ref 12.0–15.0)
Lymphocytes Relative: 23 %
Lymphs Abs: 1.9 10*3/uL (ref 0.7–4.0)
MCH: 30.4 pg (ref 26.0–34.0)
MCHC: 33.9 g/dL (ref 30.0–36.0)
MCV: 89.9 fL (ref 78.0–100.0)
Monocytes Absolute: 0.8 10*3/uL (ref 0.1–1.0)
Monocytes Relative: 10 %
NEUTROS PCT: 66 %
Neutro Abs: 5.4 10*3/uL (ref 1.7–7.7)
PLATELETS: 137 10*3/uL — AB (ref 150–400)
RBC: 3.45 MIL/uL — AB (ref 3.87–5.11)
RDW: 14 % (ref 11.5–15.5)
WBC: 8.2 10*3/uL (ref 4.0–10.5)

## 2017-06-07 LAB — BASIC METABOLIC PANEL
ANION GAP: 5 (ref 5–15)
BUN: 14 mg/dL (ref 6–20)
CHLORIDE: 102 mmol/L (ref 101–111)
CO2: 28 mmol/L (ref 22–32)
Calcium: 8.4 mg/dL — ABNORMAL LOW (ref 8.9–10.3)
Creatinine, Ser: 0.62 mg/dL (ref 0.44–1.00)
Glucose, Bld: 112 mg/dL — ABNORMAL HIGH (ref 65–99)
POTASSIUM: 4.2 mmol/L (ref 3.5–5.1)
SODIUM: 135 mmol/L (ref 135–145)

## 2017-06-07 LAB — MRSA PCR SCREENING: MRSA BY PCR: NEGATIVE

## 2017-06-07 LAB — MAGNESIUM: MAGNESIUM: 1.7 mg/dL (ref 1.7–2.4)

## 2017-06-07 SURGERY — FIXATION, FRACTURE, INTERTROCHANTERIC, WITH INTRAMEDULLARY ROD
Anesthesia: Spinal | Site: Hip | Laterality: Right

## 2017-06-07 MED ORDER — DOCUSATE SODIUM 100 MG PO CAPS
100.0000 mg | ORAL_CAPSULE | Freq: Two times a day (BID) | ORAL | Status: DC
  Administered 2017-06-08 – 2017-06-09 (×4): 100 mg via ORAL
  Filled 2017-06-07 (×4): qty 1

## 2017-06-07 MED ORDER — DEXTROSE 5 % IV SOLN
500.0000 mg | Freq: Four times a day (QID) | INTRAVENOUS | Status: DC | PRN
  Administered 2017-06-07: 500 mg via INTRAVENOUS
  Filled 2017-06-07: qty 550

## 2017-06-07 MED ORDER — CEFAZOLIN SODIUM-DEXTROSE 2-4 GM/100ML-% IV SOLN
INTRAVENOUS | Status: AC
  Filled 2017-06-07: qty 100

## 2017-06-07 MED ORDER — FENTANYL CITRATE (PF) 100 MCG/2ML IJ SOLN
INTRAMUSCULAR | Status: DC | PRN
  Administered 2017-06-07: 25 ug via INTRAVENOUS

## 2017-06-07 MED ORDER — SODIUM CHLORIDE 0.9 % IV SOLN
INTRAVENOUS | Status: DC
  Administered 2017-06-07 – 2017-06-09 (×4): via INTRAVENOUS

## 2017-06-07 MED ORDER — PROMETHAZINE HCL 25 MG/ML IJ SOLN: 6.2500 mg | INTRAMUSCULAR | Status: DC | PRN

## 2017-06-07 MED ORDER — BISACODYL 10 MG RE SUPP
10.0000 mg | Freq: Every day | RECTAL | Status: DC | PRN
Start: 2017-06-07 — End: 2017-06-09

## 2017-06-07 MED ORDER — LACTATED RINGERS IV SOLN
INTRAVENOUS | Status: DC | PRN
  Administered 2017-06-07: 07:00:00 via INTRAVENOUS

## 2017-06-07 MED ORDER — HYDROCODONE-ACETAMINOPHEN 7.5-325 MG PO TABS: 1.0000 | ORAL_TABLET | Freq: Once | ORAL | Status: DC | PRN

## 2017-06-07 MED ORDER — CEFAZOLIN SODIUM-DEXTROSE 1-4 GM/50ML-% IV SOLN
1.0000 g | Freq: Four times a day (QID) | INTRAVENOUS | Status: AC
  Administered 2017-06-07 (×2): 1 g via INTRAVENOUS
  Filled 2017-06-07 (×2): qty 50

## 2017-06-07 MED ORDER — POLYETHYLENE GLYCOL 3350 17 G PO PACK: 17.0000 g | PACK | Freq: Every day | ORAL | Status: DC | PRN

## 2017-06-07 MED ORDER — FLEET ENEMA 7-19 GM/118ML RE ENEM: 1.0000 | ENEMA | Freq: Once | RECTAL | Status: DC | PRN

## 2017-06-07 MED ORDER — ENOXAPARIN SODIUM 30 MG/0.3ML ~~LOC~~ SOLN
30.0000 mg | SUBCUTANEOUS | Status: DC
  Administered 2017-06-08 – 2017-06-09 (×2): 30 mg via SUBCUTANEOUS
  Filled 2017-06-07 (×2): qty 0.3

## 2017-06-07 MED ORDER — MEPERIDINE HCL 50 MG/ML IJ SOLN: 6.2500 mg | INTRAMUSCULAR | Status: DC | PRN

## 2017-06-07 MED ORDER — ACETAMINOPHEN 650 MG RE SUPP: 650.0000 mg | Freq: Four times a day (QID) | RECTAL | Status: DC | PRN

## 2017-06-07 MED ORDER — PHENOL 1.4 % MT LIQD
1.0000 | OROMUCOSAL | Status: DC | PRN
Start: 2017-06-07 — End: 2017-06-09
  Filled 2017-06-07: qty 177

## 2017-06-07 MED ORDER — MENTHOL 3 MG MT LOZG: 1.0000 | LOZENGE | OROMUCOSAL | Status: DC | PRN

## 2017-06-07 MED ORDER — KETAMINE HCL 10 MG/ML IJ SOLN
INTRAMUSCULAR | Status: AC
  Filled 2017-06-07: qty 1

## 2017-06-07 MED ORDER — FENTANYL CITRATE (PF) 100 MCG/2ML IJ SOLN
INTRAMUSCULAR | Status: AC
  Filled 2017-06-07: qty 2

## 2017-06-07 MED ORDER — 0.9 % SODIUM CHLORIDE (POUR BTL) OPTIME
TOPICAL | Status: DC | PRN
  Administered 2017-06-07: 1000 mL

## 2017-06-07 MED ORDER — ACETAMINOPHEN 325 MG PO TABS: 650.0000 mg | ORAL_TABLET | Freq: Four times a day (QID) | ORAL | Status: DC | PRN

## 2017-06-07 MED ORDER — PHENYLEPHRINE 40 MCG/ML (10ML) SYRINGE FOR IV PUSH (FOR BLOOD PRESSURE SUPPORT)
PREFILLED_SYRINGE | INTRAVENOUS | Status: DC | PRN
  Administered 2017-06-07 (×3): 40 ug via INTRAVENOUS

## 2017-06-07 MED ORDER — KETAMINE HCL 10 MG/ML IJ SOLN
INTRAMUSCULAR | Status: DC | PRN
  Administered 2017-06-07 (×3): 5 mg via INTRAVENOUS

## 2017-06-07 MED ORDER — METHOCARBAMOL 500 MG PO TABS
500.0000 mg | ORAL_TABLET | Freq: Four times a day (QID) | ORAL | Status: DC | PRN
  Administered 2017-06-08: 13:00:00 500 mg via ORAL
  Filled 2017-06-07: qty 1

## 2017-06-07 MED ORDER — CEFAZOLIN SODIUM-DEXTROSE 2-3 GM-%(50ML) IV SOLR
INTRAVENOUS | Status: DC | PRN
  Administered 2017-06-07: 2 g via INTRAVENOUS

## 2017-06-07 MED ORDER — HYDROMORPHONE HCL 1 MG/ML IJ SOLN: 0.2500 mg | INTRAMUSCULAR | Status: DC | PRN

## 2017-06-07 MED ORDER — PROPOFOL 10 MG/ML IV BOLUS
INTRAVENOUS | Status: AC
  Filled 2017-06-07: qty 20

## 2017-06-07 MED ORDER — ETOMIDATE 2 MG/ML IV SOLN
INTRAVENOUS | Status: AC
  Filled 2017-06-07: qty 10

## 2017-06-07 MED ORDER — HYDROCODONE-ACETAMINOPHEN 5-325 MG PO TABS
1.0000 | ORAL_TABLET | Freq: Four times a day (QID) | ORAL | Status: DC | PRN
  Administered 2017-06-07 – 2017-06-09 (×5): 1 via ORAL
  Filled 2017-06-07 (×5): qty 1

## 2017-06-07 MED ORDER — ACETAMINOPHEN 10 MG/ML IV SOLN
1000.0000 mg | Freq: Once | INTRAVENOUS | Status: DC | PRN
Start: 2017-06-07 — End: 2017-06-07

## 2017-06-07 MED ORDER — ETOMIDATE 2 MG/ML IV SOLN
INTRAVENOUS | Status: DC | PRN
  Administered 2017-06-07: 2 mg via INTRAVENOUS
  Administered 2017-06-07: 4 mg via INTRAVENOUS

## 2017-06-07 SURGICAL SUPPLY — 43 items
BAG SPEC THK2 15X12 ZIP CLS (MISCELLANEOUS) ×1
BAG URINE DRAINAGE (UROLOGICAL SUPPLIES) ×2 IMPLANT
BAG ZIPLOCK 12X15 (MISCELLANEOUS) ×3 IMPLANT
BIT DRILL CANN LG 4.3MM (BIT) IMPLANT
BNDG GAUZE ELAST 4 BULKY (GAUZE/BANDAGES/DRESSINGS) ×3 IMPLANT
CLOSURE WOUND 1/2 X4 (GAUZE/BANDAGES/DRESSINGS) ×1
COVER PERINEAL POST (MISCELLANEOUS) ×3 IMPLANT
COVER SURGICAL LIGHT HANDLE (MISCELLANEOUS) ×3 IMPLANT
DRAPE INCISE IOBAN 66X45 STRL (DRAPES) ×3 IMPLANT
DRILL BIT CANN LG 4.3MM (BIT) ×3
DRSG MEPILEX BORDER 4X4 (GAUZE/BANDAGES/DRESSINGS) ×4 IMPLANT
DRSG MEPILEX BORDER 4X8 (GAUZE/BANDAGES/DRESSINGS) ×3 IMPLANT
DURAPREP 26ML APPLICATOR (WOUND CARE) ×3 IMPLANT
ELECT REM PT RETURN 15FT ADLT (MISCELLANEOUS) ×3 IMPLANT
FACESHIELD WRAPAROUND (MASK) ×3 IMPLANT
FACESHIELD WRAPAROUND OR TEAM (MASK) ×2 IMPLANT
GLOVE BIO SURGEON STRL SZ7.5 (GLOVE) ×1 IMPLANT
GLOVE BIO SURGEON STRL SZ8 (GLOVE) ×4 IMPLANT
GLOVE BIOGEL PI IND STRL 8 (GLOVE) ×2 IMPLANT
GLOVE BIOGEL PI INDICATOR 8 (GLOVE) ×2
GOWN STRL REUS W/TWL LRG LVL3 (GOWN DISPOSABLE) ×3 IMPLANT
GOWN STRL REUS W/TWL XL LVL3 (GOWN DISPOSABLE) ×3 IMPLANT
GUIDEPIN 3.2X17.5 THRD DISP (PIN) ×2 IMPLANT
HIP FRA NAIL LAG SCREW 10.5X90 (Orthopedic Implant) ×3 IMPLANT
KIT BASIN OR (CUSTOM PROCEDURE TRAY) ×3 IMPLANT
MANIFOLD NEPTUNE II (INSTRUMENTS) ×3 IMPLANT
NAIL HIP FRACT 130D 11X180 (Screw) ×2 IMPLANT
NS IRRIG 1000ML POUR BTL (IV SOLUTION) ×3 IMPLANT
PACK GENERAL/GYN (CUSTOM PROCEDURE TRAY) ×3 IMPLANT
POSITIONER SURGICAL ARM (MISCELLANEOUS) ×3 IMPLANT
SCREW BONE CORTICAL 5.0X3 (Screw) ×2 IMPLANT
SCREW LAG HIP FRA NAIL 10.5X90 (Orthopedic Implant) IMPLANT
STRIP CLOSURE SKIN 1/2X4 (GAUZE/BANDAGES/DRESSINGS) ×1 IMPLANT
SUT VIC AB 0 CT1 27 (SUTURE) ×3
SUT VIC AB 0 CT1 27XBRD ANTBC (SUTURE) ×1 IMPLANT
SUT VIC AB 1 CT1 27 (SUTURE) ×3
SUT VIC AB 1 CT1 27XBRD ANTBC (SUTURE) ×1 IMPLANT
SUT VIC AB 2-0 CT1 27 (SUTURE) ×3
SUT VIC AB 2-0 CT1 TAPERPNT 27 (SUTURE) ×1 IMPLANT
SUT VIC AB 4-0 PS2 18 (SUTURE) ×3 IMPLANT
TOWEL OR 17X26 10 PK STRL BLUE (TOWEL DISPOSABLE) ×3 IMPLANT
TRAY FOLEY CATH 14FRSI W/METER (CATHETERS) ×2 IMPLANT
TRAY FOLEY W/METER SILVER 16FR (SET/KITS/TRAYS/PACK) IMPLANT

## 2017-06-07 NOTE — Transfer of Care (Signed)
Immediate Anesthesia Transfer of Care Note  Patient: [name] [name]  Procedure(s) Performed: INTRAMEDULLARY (IM) NAIL INTERTROCHANTRIC (Right Hip)  Patient Location: PACU  Anesthesia Type:Spinal  Level of Consciousness: awake, alert , oriented and patient cooperative  Airway & Oxygen Therapy: Patient Spontanous Breathing and Patient connected to face mask  Post-op Assessment: Report given to RN and Post -op Vital signs reviewed and stable  Post vital signs: Reviewed and stable  Last Vitals:  Vitals:   06/06/17 1359 06/06/17 2116  BP: (!) 121/53 (!) 120/56  Pulse: 72 85  Resp: 16 16  Temp: 36.7 C 37 C  SpO2: 100% 98%    Last Pain:  Vitals:   06/06/17 2226  TempSrc:   PainSc: Asleep      Patients Stated Pain Goal: 2 (40/34/74 2595)  Complications: No apparent anesthesia complications

## 2017-06-07 NOTE — Anesthesia Postprocedure Evaluation (Signed)
Anesthesia Post Note  Patient: [name] [name]  Procedure(s) Performed: INTRAMEDULLARY (IM) NAIL INTERTROCHANTRIC (Right Hip)     Patient location during evaluation: PACU Anesthesia Type: Spinal Level of consciousness: oriented and awake and alert Pain management: pain level controlled Vital Signs Assessment: post-procedure vital signs reviewed and stable Respiratory status: spontaneous breathing, respiratory function stable and patient connected to nasal cannula oxygen Cardiovascular status: blood pressure returned to baseline and stable Postop Assessment: no headache, no backache and no apparent nausea or vomiting Anesthetic complications: no    Last Vitals:  Vitals:   06/07/17 1008 06/07/17 1103  BP: 124/61 116/68  Pulse: 72 79  Resp: 14 13  Temp: 36.5 C 36.7 C  SpO2: 100% 96%    Last Pain:  Vitals:   06/07/17 1103  TempSrc: Axillary  PainSc:                  Barnet Glasgow

## 2017-06-07 NOTE — Op Note (Signed)
  OPERATIVE REPORT   PREOPERATIVE DIAGNOSIS: Right intertrochanteric femur fracture.   POSTOP DIAGNOSIS: Right intertrochanteric femur fracture.   PROCEDURE: Intramedullary nailing, Right intertrochanteric femur  fracture.   SURGEON: Gaynelle Arabian, M.D.   ASSISTANT: none  ANESTHESIA:Spinal  Estimated BLOOD LOSS: 100 ml  DRAINS: None.   COMPLICATIONS:   None  CONDITION: -PACU - hemodynamically stable.    CLINICAL NOTE: [name] [name] is an 84 y.o. female, who had a fall 2  days ago sustaining a displaced  Right intertrochanteric femur fracture. They have been cleared medically and present for operative fixation   PROCEDURE IN DETAIL: After successful administration of  Spinal,  the patient was placed on the fracture table with Right lower extremity in a well-padded traction boot,  Left lower extremity in a well-padded leg holder. Under fluoroscopic guidance, the fracture wasreduced. The traction was locked in this position. Thigh was prepped  and draped in the usual sterile fashion. The guide pin for the [employer]  Affixus was then passed percutaneously to the tip of the greater  trochanter, and then entered into the femoral canal. It was passed into the  canal. The small incision was made and the starter reamer passed over  the guide pin. This was then removed. The nail which was an 11  mm  diameter short trochanteric nail with 130 degrees angle was attached to  the external guide and then passed into the femoral canal, impacted to  the appropriate depth in the canal, then we used the external guide to  place the lag screw. Through the external guide, a guide pin was  passed. Small incision made, and the guide pin was in the center of the  femoral head on the AP and slightly center to posterior on the lateral.  Length was 90 mm. Triple reamer was passed over the guide pin. 90 mm  lag screw was placed. It was then locked down with a locking screw.  Through the external guide, the  distal interlock was placed through the  static hole and this was 34 mm in length with excellent bicortical  purchase. The external guide was then removed. Hardware was in good  position and fracture was well reduced. Wound was copiously irrigated with saline  solution, and  closed deep with interrupted 1 Vicryl, subcu  interrupted 2-0 Vicryl, subcuticular running 4-0 Monocryl. Incision was  cleaned and dried and sterile dressings applied. The patient was awakened and  transported to recovery in stable condition.   [name] Plover Zahraa Bhargava, MD    06/07/2017, 8:42 AM

## 2017-06-07 NOTE — Plan of Care (Signed)
Plan of care reviewed. 

## 2017-06-07 NOTE — Anesthesia Procedure Notes (Signed)
Spinal  Patient location during procedure: OR Start time: 06/07/2017 7:30 AM End time: 06/07/2017 7:42 AM Staffing Performed: anesthesiologist  Preanesthetic Checklist Completed: patient identified, surgical consent, pre-op evaluation, timeout performed, IV checked, risks and benefits discussed and monitors and equipment checked Spinal Block Patient position: left lateral decubitus Prep: DuraPrep Patient monitoring: heart rate, cardiac monitor, continuous pulse ox and blood pressure Approach: midline Location: L3-4 Injection technique: single-shot Needle Needle type: Spinocan  Needle gauge: 22 G Needle length: 9 cm

## 2017-06-07 NOTE — Progress Notes (Addendum)
PROGRESS NOTE  [name] [name] CHE:[id number] DOB: 1923/10/30 DOA: 06/06/2017 PCP: Marin Olp, MD  HPI/Recap of past 24 hours:  Returned from [place], still drowsy from the procedure,  No family in room  Assessment/Plan: Principal Problem:   Closed right hip fracture, initial encounter [facility]) Active Problems:   Essential hypertension   COPD with emphysema Gold B   History of breast cancer, T1b, N0, Lumpectomy 07/21/2007.   DNR (do not resuscitate)   Hip fracture ([place])  Right hip fracture status post mechanical fall  -preop eval by pulmonology cleared patient to undergo surgery with spinal anesthesia due to h/o pharyngeal stenosis.  -she underwent Intramedullary nailing, Right intertrochanteric femur fracture with spinal anesthesia on 1/27 - post op pain control, wound care, weight bearing status and dvt prophylasix per ortho.  H/o RML PE -04/25/2016 CTA chest-- She is started on apixaban since Ortho please resume apixaban if ok from surgical stand point.    H/o copd/astham/pulmonary fibrosis h/o pulmonary MAC:  Stable, pulmonary consulted  H/o CAD, MVP, EKG with sinus rhythm Stable, no chest pain  H/o HTN; not on meds at home, on prn hydralazine here.  Normocytic normochromic anemia -hgb stable.   Dementia: at baseline. She has been on hospice.  Body mass index is 16.46 kg/m.   History of breast cancer in remission.   Code Status: DNR  Family Communication: patient   Disposition Plan: return to SNF with hospice   Consultants:  Ortho  Social worker  Procedures: Intramedullary nailing, Right intertrochanteric femur fracture on 1/27 by ortho Dr Wynelle Link  Antibiotics:  Perioperative cefazolin   Objective: BP (!) 148/73 (BP Location: Left Arm) Comment: notified RN  Pulse 79   Temp 98.2 F (36.8 C) (Axillary)   Resp 13   Ht 5' 2"  (5.361 m)   Wt 40.8 kg (90 lb)   SpO2 91%   BMI 16.46 kg/m   Intake/Output Summary (Last 24 hours) at 06/07/2017  1253 Last data filed at 06/07/2017 1222 Gross per 24 hour  Intake 480 ml  Output 240 ml  Net 240 ml   Filed Weights   06/06/17 0500  Weight: 40.8 kg (90 lb)    Exam: Patient is examined daily including today on 06/07/2017, exams remain the same as of yesterday except that has changed    General:  Frail elderly, thin, drowsy  Cardiovascular: RRR  Respiratory: CTABL  Abdomen: Soft/ND/NT, positive BS  Musculoskeletal: right hip post op changes  Neuro: drowsy  Data Reviewed: Basic Metabolic Panel: Recent Labs  Lab 06/06/17 0139 06/07/17 0511  NA 139 135  K 3.8 4.2  CL 100* 102  CO2  --  28  GLUCOSE 152* 112*  BUN 19 14  CREATININE 0.70 0.62  CALCIUM  --  8.4*  MG  --  1.7   Liver Function Tests: No results for input(s): AST, ALT, ALKPHOS, BILITOT, PROT, ALBUMIN in the last 168 hours. No results for input(s): LIPASE, AMYLASE in the last 168 hours. No results for input(s): AMMONIA in the last 168 hours. CBC: Recent Labs  Lab 06/06/17 0131 06/06/17 0139 06/07/17 0511  WBC 14.3*  --  8.2  NEUTROABS 12.4*  --  5.4  HGB 11.4* 11.9* 10.5*  HCT 34.5* 35.0* 31.0*  MCV 89.1  --  89.9  PLT 174  --  137*   Cardiac Enzymes:   No results for input(s): CKTOTAL, CKMB, CKMBINDEX, TROPONINI in the last 168 hours. BNP (last 3 results) No results for input(s): BNP in  the last 8760 hours.  ProBNP (last 3 results) No results for input(s): PROBNP in the last 8760 hours.  CBG: No results for input(s): GLUCAP in the last 168 hours.  Recent Results (from the past 240 hour(s))  MRSA PCR Screening     Status: None   Collection Time: 06/06/17 10:32 PM  Result Value Ref Range Status   MRSA by PCR NEGATIVE NEGATIVE Final    Comment:        The GeneXpert MRSA Assay (FDA approved for NASAL specimens only), is one component of a comprehensive MRSA colonization surveillance program. It is not intended to diagnose MRSA infection nor to guide or monitor treatment for MRSA  infections.      Studies: Dg C-arm 1-60 Min-no Report  Result Date: 06/07/2017 Fluoroscopy was utilized by the requesting physician.  No radiographic interpretation.   Dg Hip Operative Unilat With Pelvis Right  Result Date: 06/07/2017 CLINICAL DATA:  84 year old female with history of hip fixation status post fracture EXAM: OPERATIVE RIGHT HIP (WITH PELVIS IF PERFORMED)  VIEWS TECHNIQUE: Fluoroscopic spot image(s) were submitted for interpretation post-operatively. COMPARISON:  None. FINDINGS: Intraoperative fluoroscopic spot images of ORIF right hip. Surgical changes of antegrade intramedullary rod of the right femur with cannulated interlocking screw of the right femoral neck and single distal interlocking screw. No immediate complicating features. IMPRESSION: Limited intraoperative fluoroscopic spot images of right hip ORIF with antegrade intramedullary rod placement, interlocking cannulated screw the femoral neck, and single interlocking screw distally without complicating feature. Please refer to the dictated operative report for full details of intraoperative findings and procedure. Electronically Signed   By: Corrie Mckusick D.O.   On: 06/07/2017 09:12    Scheduled Meds: . docusate sodium  100 mg Oral BID  . [START ON 06/08/2017] enoxaparin (LOVENOX) injection  30 mg Subcutaneous Q24H  . fluticasone  2 spray Each Nare BID  . mirtazapine  15 mg Oral QHS  . senna-docusate  1 tablet Oral Daily    Continuous Infusions: . sodium chloride 75 mL/hr at 06/07/17 0945  . ceFAZolin    .  ceFAZolin (ANCEF) IV    . methocarbamol (ROBAXIN)  IV Stopped (06/07/17 0935)     Time spent: 35 mins I have personally reviewed and interpreted on  06/07/2017 daily labs,imagings as discussed above under date review session and assessment and plans.  I reviewed all nursing notes, pharmacy notes, consultant notes,  vitals, pertinent old records  I have discussed plan of care as described above with RN ,  patient  on 06/07/2017   Florencia Reasons MD, PhD  [facility] Pager [phone]. If 7PM-7AM, please contact night-coverage at [email or web address], password [facility] 06/07/2017, 12:53 PM  LOS: 1 day

## 2017-06-07 NOTE — Interval H&P Note (Signed)
History and Physical Interval Note:  06/07/2017 7:05 AM  [name] [name]  has presented today for surgery, with the diagnosis of right hip fracture  The various methods of treatment have been discussed with the patient and family. After consideration of risks, benefits and other options for treatment, the patient has consented to  Procedure(s): INTRAMEDULLARY (IM) NAIL INTERTROCHANTRIC (Right) as a surgical intervention .  The patient's history has been reviewed, patient examined, no change in status, stable for surgery.  I have reviewed the patient's chart and labs.  Questions were answered to the patient's satisfaction.     Pilar Plate Gwyn Mehring

## 2017-06-08 ENCOUNTER — Encounter (HOSPITAL_COMMUNITY): Payer: Self-pay | Admitting: Orthopedic Surgery

## 2017-06-08 LAB — BASIC METABOLIC PANEL
ANION GAP: 4 — AB (ref 5–15)
BUN: 13 mg/dL (ref 6–20)
CALCIUM: 7.9 mg/dL — AB (ref 8.9–10.3)
CHLORIDE: 103 mmol/L (ref 101–111)
CO2: 29 mmol/L (ref 22–32)
Creatinine, Ser: 0.6 mg/dL (ref 0.44–1.00)
GFR calc Af Amer: >60 mL/min (ref >60)
GFR calc non Af Amer: >60 mL/min (ref >60)
GLUCOSE: 112 mg/dL — AB (ref 65–99)
POTASSIUM: 4.2 mmol/L (ref 3.5–5.1)
Sodium: 136 mmol/L (ref 135–145)

## 2017-06-08 LAB — CBC
HEMATOCRIT: 28 % — AB (ref 36.0–46.0)
HEMOGLOBIN: 9.2 g/dL — AB (ref 12.0–15.0)
MCH: 29.1 pg (ref 26.0–34.0)
MCHC: 32.9 g/dL (ref 30.0–36.0)
MCV: 88.6 fL (ref 78.0–100.0)
Platelets: 74 10*3/uL — ABNORMAL LOW (ref 150–400)
RBC: 3.16 MIL/uL — ABNORMAL LOW (ref 3.87–5.11)
RDW: 13.7 % (ref 11.5–15.5)
WBC: 9 10*3/uL (ref 4.0–10.5)

## 2017-06-08 MED ORDER — ENOXAPARIN SODIUM 30 MG/0.3ML ~~LOC~~ SOLN: 30.0000 mg | SUBCUTANEOUS | 0 refills | Status: AC

## 2017-06-08 MED ORDER — BISACODYL 10 MG RE SUPP: 10.0000 mg | Freq: Every day | RECTAL | 0 refills | Status: AC | PRN

## 2017-06-08 MED ORDER — APIXABAN 5 MG PO TABS: 5.0000 mg | ORAL_TABLET | Freq: Two times a day (BID) | ORAL | 0 refills | Status: AC

## 2017-06-08 MED ORDER — POLYETHYLENE GLYCOL 3350 17 G PO PACK: 17.0000 g | PACK | Freq: Every day | ORAL | 0 refills | Status: AC | PRN

## 2017-06-08 MED ORDER — ENSURE ENLIVE PO LIQD
120.0000 mL | Freq: Two times a day (BID) | ORAL | Status: DC
  Administered 2017-06-08 – 2017-06-09 (×2): 120 mL via ORAL

## 2017-06-08 MED ORDER — HYDROCODONE-ACETAMINOPHEN 5-325 MG PO TABS: 1.0000 | ORAL_TABLET | Freq: Four times a day (QID) | ORAL | 0 refills | Status: AC | PRN

## 2017-06-08 MED ORDER — FLEET ENEMA 7-19 GM/118ML RE ENEM: 1.0000 | ENEMA | Freq: Every day | RECTAL | 0 refills | Status: AC | PRN

## 2017-06-08 NOTE — Progress Notes (Signed)
PROGRESS NOTE  [name] [name]:[id number] DOB: 19-Sep-1923 DOA: 06/06/2017 PCP: Marin Olp, MD  HPI/Recap of past 24 hours:  POD#1 Demented elderly not able to provide history She denies pain  Assessment/Plan: Principal Problem:   Closed right hip fracture, initial encounter [facility]) Active Problems:   Essential hypertension   COPD with emphysema Gold B   History of breast cancer, T1b, N0, Lumpectomy 07/21/2007.   DNR (do not resuscitate)   Hip fracture ([place])  Right hip fracture status post mechanical fall  preop eval by pulmonology cleared patient to undergo surgery with spinal anesthesia due to h/o pharyngeal stenosis.  -she underwentIntramedullary nailing,Rightintertrochanteric femur fracturewith spinal anesthesia on 1/27 - post op pain control, wound care, weight bearing status and dvt prophylasix per ortho. -case discussed with ortho PA Mr Arlee Muslim who recommend patient to received lovenox on 1/28 and 1/29, resume home dose apixaban on 1/30.   H/o RML PE -04/25/2016 CTA chest-- Currently on lovenox, resume apixaban on 1/30    H/o copd/astham/pulmonary fibrosis h/o pulmonary MAC:  Stable, pulmonary consulted  H/o CAD, MVP, EKG with sinus rhythm Stable, no chest pain  H/o HTN; not on meds at home, on prn hydralazine here.  Normocytic normochromic anemia -hgb stable.   Dementia: at baseline. She has been on hospice.  Body mass index is 16.46 kg/m.   History of breast cancer in remission.   Code Status: DNR  Family Communication: patient in room, daughter over the phone.  Disposition Plan: return to SNF on 1/29   Consultants:  Ortho  Social worker  Procedures: Intramedullary nailing, Right intertrochanteric femur fracture on 1/27 by ortho Dr Wynelle Link  Antibiotics:  Perioperative cefazolin   Objective: BP (!) 98/49 (BP Location: Left Arm)   Pulse 79   Temp 98 F (36.7 C) (Axillary)   Resp 16   Ht _0  (1.575 m)   Wt 40.8 kg (90  lb)   SpO2 96%   BMI 16.46 kg/m   Intake/Output Summary (Last 24 hours) at 06/08/2017 1137 Last data filed at 06/08/2017 1100 Gross per 24 hour  Intake 1045 ml  Output 625 ml  Net 420 ml   Filed Weights   06/06/17 0500  Weight: 40.8 kg (90 lb)    Exam: Patient is examined daily including today on 06/08/2017, exams remain the same as of yesterday except that has changed    General:  Frail elderly, thin, alert, pleasantly demented   Cardiovascular: RRR  Respiratory: CTABL  Abdomen: Soft/ND/NT, positive BS  Musculoskeletal: right hip post op changes  Neuro: alert  Data Reviewed: Basic Metabolic Panel: Recent Labs  Lab 06/06/17 0139 06/07/17 0511 06/08/17 0529  NA 139 135 136  K 3.8 4.2 4.2  CL 100* 102 103  CO2  --  28 29  GLUCOSE 152* 112* 112*  BUN _1 CREATININE 0.70 0.62 0.60  CALCIUM  --  8.4* 7.9*  MG  --  1.7  --    Liver Function Tests: No results for input(s): AST, ALT, ALKPHOS, BILITOT, PROT, ALBUMIN in the last 168 hours. No results for input(s): LIPASE, AMYLASE in the last 168 hours. No results for input(s): AMMONIA in the last 168 hours. CBC: Recent Labs  Lab 06/06/17 0131 06/06/17 0139 06/07/17 0511 06/08/17 0529  WBC 14.3*  --  8.2 9.0  NEUTROABS 12.4*  --  5.4  --   HGB 11.4* 11.9* 10.5* 9.2*  HCT 34.5* 35.0* 31.0* 28.0*  MCV 89.1  --  89.9 88.6  PLT 174  --  137* 74*   Cardiac Enzymes:   No results for input(s): CKTOTAL, CKMB, CKMBINDEX, TROPONINI in the last 168 hours. BNP (last 3 results) No results for input(s): BNP in the last 8760 hours.  ProBNP (last 3 results) No results for input(s): PROBNP in the last 8760 hours.  CBG: No results for input(s): GLUCAP in the last 168 hours.  Recent Results (from the past 240 hour(s))  MRSA PCR Screening     Status: None   Collection Time: 06/06/17 10:32 PM  Result Value Ref Range Status   MRSA by PCR NEGATIVE NEGATIVE Final    Comment:        The GeneXpert MRSA Assay  (FDA approved for NASAL specimens only), is one component of a comprehensive MRSA colonization surveillance program. It is not intended to diagnose MRSA infection nor to guide or monitor treatment for MRSA infections.      Studies: No results found.  Scheduled Meds: . docusate sodium  100 mg Oral BID  . enoxaparin (LOVENOX) injection  30 mg Subcutaneous Q24H  . fluticasone  2 spray Each Nare BID  . mirtazapine  15 mg Oral QHS  . senna-docusate  1 tablet Oral Daily    Continuous Infusions: . sodium chloride 75 mL/hr at 06/08/17 0024  . methocarbamol (ROBAXIN)  IV Stopped (06/07/17 0935)     Time spent: 35 mins, case discussed with social worker, ortho PA, and daughter. I have personally reviewed and interpreted on  06/08/2017 daily labs.  I reviewed all nursing notes, consultant notes,  vitals, pertinent old records  I have discussed plan of care as described above with RN , patient  on 06/08/2017   Florencia Reasons MD, PhD  [facility] Pager [phone]. If 7PM-7AM, please contact night-coverage at [email or web address], password [facility] 06/08/2017, 11:37 AM  LOS: 2 days

## 2017-06-08 NOTE — Progress Notes (Signed)
CSW following for discharge needs back to [facility].  Hospice is following and providing care at facility.  The patient daughter has revoked hospice in hopes the patient will rehab at facility at discharge.  CSW sent clinical information to [place] via [place], [place], MSW Clinical Social Worker  [phone] 06/08/2017  11:22 AM

## 2017-06-08 NOTE — Progress Notes (Signed)
Initial Nutrition Assessment  DOCUMENTATION CODES:   Underweight  INTERVENTION:  RD to order Ensure Enlive po 1x/day, each supplement provides 350kcals and 20g of protein.   NUTRITION DIAGNOSIS:   Inadequate oral intake related to poor appetite as evidenced by meal completion < 50%, per patient/family report.  GOAL:   Patient will meet greater than or equal to 90% of their needs  MONITOR:   PO intake, Supplement acceptance, Weight trends, I & O's  REASON FOR ASSESSMENT:   Consult Hip fracture protocol  ASSESSMENT:   84 y.o. F with PMHx of dementia, COPD, and HTN. Admitted for hip fracture following a fall at pt's SNF. S/p IM nailing.  Pt reports not having a good appetite since admission. She denies pain with chewing/swallowing and also denies N/V. Pt states she has no issues with her teeth and appears to have good dentition. Pt reports not having a bowel movement since admission.   PTA pt reports she typically has a good appetite and eats 3 meals a day.   Pt reports her daughter has been bringing her food since admission but had not visited today to be with another family member in surgery.    Due to pt's dementia, dietary recall was confirmed with the pt's RN. For breakfast, the pt had a couple pieces of bacon, a bite or two of french toast, and 2 glasses of orange juice. For lunch the pt had 50% of a burger without sides. RN reports the pt is easily distracted at mealtimes and has to be encouraged to continue eating.   The pt was amenable to trying Ensure (chocolate) but only 50% at a time.   Pt's weight seems to be trending downward for the past year. Pt has lost 16lb (15%) since 05/30/16 and 8lbs (9%) since last recorded weight on 07/16/16. Neither weight loss is significant for time frame. Further weight history could not be obtained.   Labs reviewed.  Medications: Colace, senokot.   NUTRITION - FOCUSED PHYSICAL EXAM:    Most Recent Value  Orbital Region  Mild  depletion  Upper Arm Region  Moderate depletion  Thoracic and Lumbar Region  Unable to assess  Buccal Region  Mild depletion  Temple Region  Moderate depletion  Clavicle Bone Region  Moderate depletion  Clavicle and Acromion Bone Region  Severe depletion  Scapular Bone Region  Severe depletion  Dorsal Hand  Moderate depletion  Patellar Region  Unable to assess  Anterior Thigh Region  Unable to assess  Posterior Calf Region  Unable to assess  Edema (RD Assessment)  Unable to assess     Pt is sensitive to touch in the shoulder area, hand, and could not move left leg without discomfort.  Muscle and fat depletion likely due to advanced age and altered mental status rather than malnutrition.  Diet Order:  Diet regular Room service appropriate? Yes; Fluid consistency: Thin Diet general  EDUCATION NEEDS:   No education needs have been identified at this time  Skin:  Skin Assessment: Skin Integrity Issues: Skin Integrity Issues:: Incisions Incisions: Right leg  Last BM:  PTA  Height:   Ht Readings from Last 1 Encounters:  06/06/17 5\' 2"  (1.575 m)    Weight:  Wt Readings from Last 3 Encounters:  06/06/17 90 lb (40.8 kg)  07/16/16 98 lb 12.8 oz (44.8 kg)  06/27/16 104 lb 6.4 oz (47.4 kg)    Ideal Body Weight:  50 kg  BMI:  Body mass index is 16.46 kg/m.  Estimated Nutritional Needs:   Kcal:  1300-1500  Protein:  60-70g  Fluid:  >1.3L    Ercie Eliasen, MS, Dietetic Intern Pager # [phone]

## 2017-06-08 NOTE — Progress Notes (Addendum)
Subjective: 1 Day Post-Op Procedure(s) (LRB): INTRAMEDULLARY (IM) NAIL INTERTROCHANTRIC (Right) Patient reports pain as moderate.   Patient seen in rounds by Dr. Wynelle Link. Patient is having problems with pain in the hip, requiring pain medications We will start therapy today.  Plan is to go Skilled nursing facility after hospital stay.  Objective: Vital signs in last 24 hours: Temp:  [97.6 F (36.4 C)-98.8 F (37.1 C)] 98.5 F (36.9 C) (01/28 0551) Pulse Rate:  [60-90] 78 (01/28 0551) Resp:  [13-19] 16 (01/28 0551) BP: (95-156)/(45-73) 114/51 (01/28 0551) SpO2:  [91 %-100 %] 98 % (01/28 0551)  Intake/Output from previous day: 01/27 0701 - 01/28 0700 In: 1285 [P.O.:60; I.V.:1225] Out: 615 [Urine:565; Blood:50] Intake/Output this shift: No intake/output data recorded.  Recent Labs    06/06/17 0131 06/06/17 0139 06/07/17 0511 06/08/17 0529  HGB 11.4* 11.9* 10.5* 9.2*   Recent Labs    06/07/17 0511 06/08/17 0529  WBC 8.2 9.0  RBC 3.45* 3.16*  HCT 31.0* 28.0*  PLT 137* 74*   Recent Labs    06/07/17 0511 06/08/17 0529  NA 135 136  K 4.2 4.2  CL 102 103  CO2 28 29  BUN 14 13  CREATININE 0.62 0.60  GLUCOSE 112* 112*  CALCIUM 8.4* 7.9*   No results for input(s): LABPT, INR in the last 72 hours.  EXAM General - Patient is Alert, Appropriate and Oriented Extremity - Neurovascular intact Sensation intact distally Dressing - dressing C/D/I Motor Function - intact, moving foot and toes well on exam.   Past Medical History:  Diagnosis Date  . Airway obstruction   . Allergic rhinitis   . Anal stricture   . ANXIETY 08/16/2008  . Atrial fibrillation ([place])    she denies known hx of afib 02/24/13  . CAD, UNSPECIFIED SITE 10/17/2008  . Candida esophagitis ([place])   . CHOLELITHIASIS 08/23/2008  . Chronic constipation   . Colitis    sigmoid  . Complication of anesthesia    difficult intubation  . COPD 01/04/2010   FeV1 101%, DLCO64% 2008  . COPD (chronic  obstructive pulmonary disease) ([place]) 01/04/2010  . 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 Diverticulitis   . DIVERTICULOSIS-COLON 04/07/2008  . Emphysema of lung ([place])   . Esophageal stricture   . Fatty liver   . GERD 04/19/2007  . Hearing aid worn    bilateral  . Hiatal hernia   . History of diverticulitis of colon   . HYPERSOMNIA, ASSOCIATED WITH SLEEP APNEA 02/01/2007  . HYPERTENSION 11/12/2006  . IBS (irritable bowel syndrome)   . MITRAL VALVE PROLAPSE, HX OF 10/07/2008  . NEOPLASM, MALIGNANT, BREAST, RIGHT 02/15/2008  . OSTEOPOROSIS 04/04/2008  . Pulmonary fibrosis ([place])   . Raynaud's syndrome 04/25/2009  . Sleep apnea   . Tortuous colon   . UTI (urinary tract infection)     Assessment/Plan: 1 Day Post-Op Procedure(s) (LRB): INTRAMEDULLARY (IM) NAIL INTERTROCHANTRIC (Right) Principal Problem:   Closed right hip fracture, initial encounter [facility]) Active Problems:   Essential hypertension   COPD with emphysema Gold B   History of breast cancer, T1b, N0, Lumpectomy 07/21/2007.   DNR (do not resuscitate)   Hip fracture ([place])  Estimated body mass index is 16.46 kg/m as calculated from the following:   Height as of this encounter: 5\' 2"  (1.575 m).   Weight as of this encounter: 40.8 kg (90 lb). Up with therapy Discharge to SNF once she is stable  DVT Prophylaxis - Lovenox Weight-Bearing as tolerated  to right leg D/C O2 and Pulse OX and try on Room Air  Addendum -  Patient is being followed by Hospice and Palliative Care.  Patient underwent surgery for pain control of the unstable fracture and surgical fixation to allow for even simple bed mobility for basic hygiene and care of this patient.  This surgery was done to improve pain measures and allow her for basic bathroom and personal hygiene.  Arlee Muslim, PA-C Orthopaedic Surgery 06/08/2017, 7:13 AM

## 2017-06-08 NOTE — Clinical Social Work Note (Signed)
Clinical Social Work Assessment  Patient Details  Name: [name] [name] MRN: [id number] Date of Birth: 15-Feb-1924  Date of referral:  06/08/17               Reason for consult:  Facility Placement                Permission sought to share information with:  Family Supports, Chartered certified accountant granted to share information::     Name::        Agency::  [facility]  Relationship::  daughter   Contact Information:     Housing/Transportation Living arrangements for the past 2 months:  [place] of Information:  Adult Children Patient Interpreter Needed:  None Criminal Activity/Legal Involvement Pertinent to Current Situation/Hospitalization:    Significant Relationships:  Adult Children Lives with:  Facility Resident Do you feel safe going back to the place where you live?  Yes Need for family participation in patient care:  Yes (Demented. Dependent w/ Mobility) Care giving concerns:  Patient admitted after fall at facility. Patient underwent surgical intervention.    Social Worker assessment / plan:  CSW discuss discharge planning with patient daughter. Right Fracture and hip pain. The patient is from [place] and the plan is to return. The patient was active with Hospice services but has temporarily  revoked services. The patient daughter is hopeful the patient will benefit from skill rehab.  CSW discussed with facility liaison.   Plan: d/c SNF to continue rehab  Employment status:  Retired Forensic scientist:  Medicare PT Recommendations:  [place] / Referral to community resources:  [place]  Patient/Family's Response to care:  Agreeable and Responding well to care.   Patient/Family's Understanding of and Emotional Response to Diagnosis, Current Treatment, and Prognosis: Patient daughter reports, " I want her to progress with physical therapy, I want  to give her a  chance." Patient daughter very involved in patient care and knowledgeable of diagnosis.  Emotional Assessment Appearance:  Appears stated age Attitude/Demeanor/Rapport:    Affect (typically observed):  Accepting, Appropriate Orientation:  Oriented to Self, Oriented to Place, Oriented to  Time, Oriented to Situation Alcohol / Substance use:  Not Applicable Psych involvement (Current and /or in the community):  No (Comment)  Discharge Needs  Concerns to be addressed:  Discharge Planning Concerns Readmission within the last 30 days:  No Current discharge risk:  Dependent with Mobility Barriers to Discharge:  Continued Medical Work up   [employer], LCSW 06/08/2017, 12:50 PM

## 2017-06-08 NOTE — Evaluation (Signed)
Physical Therapy Evaluation Patient Details Name: [name] [name] MRN: [id number] DOB: May 15, 1923 Today's Date: 06/08/2017   History of Present Illness  [name] [name] is a 84 y.o. female with history of COPD, hypertension presently not on medications had a fall at the skilled nursing facility after slipping on her socks;  ER x-rays revealed right hip fracture, s/p IM nail per Dr. Wynelle Link on 06/07/17  Clinical Impression  Pt admitted with above diagnosis. Pt currently with functional limitations due to the deficits listed below (see PT Problem List). * Pt will benefit from skilled PT to increase their independence and safety with mobility to allow discharge to the venue listed below.  Pt will need SNF level therapy post acute, session I slimited by pain and pt becoming agitated with initiation of movement/mobility     Follow Up Recommendations SNF    Equipment Recommendations  None recommended by PT    Recommendations for Other Services       Precautions / Restrictions Precautions Precautions: Fall Restrictions Weight Bearing Restrictions: No      Mobility  Bed Mobility Overal bed mobility: Needs Assistance Bed Mobility: Supine to Sit;Sit to Supine     Supine to sit: Total assist;+2 for safety/equipment Sit to supine: +2 for physical assistance;+2 for safety/equipment;Total assist   General bed mobility comments: pt is resistant to imposed movement, able to assist pt to EOB however  pt with incr agitation, returned to supine with +2 assist, bed pad used to position pt  Transfers                 General transfer comment: unable  Ambulation/Gait             General Gait Details: unable  Stairs            Wheelchair Mobility    Modified Rankin (Stroke Patients Only)       Balance Overall balance assessment: Needs assistance;History of Falls Sitting-balance support: Single extremity supported;Feet supported Sitting balance-Leahy Scale: Poor   Postural  control: Posterior lean     Standing balance comment: unable                             Pertinent Vitals/Pain Pain Assessment: Faces Faces Pain Scale: Hurts worst Pain Location: right hip with movement Pain Descriptors / Indicators: Grimacing;Operative site guarding;Moaning Pain Intervention(s): Limited activity within patient's tolerance;Monitored during session;Premedicated before session;Repositioned    Home Living Family/patient expects to be discharged to:: Skilled nursing facility                 Additional Comments: pt is unable to give hx d/t dementia/cognitive status, no family present; per last PT notes from last hospital admission pt was ambulatory    Prior Function                 Hand Dominance        Extremity/Trunk Assessment   Upper Extremity Assessment Upper Extremity Assessment: Generalized weakness    Lower Extremity Assessment Lower Extremity Assessment: Generalized weakness;RLE deficits/detail RLE: Unable to fully assess due to pain       Communication   Communication: HOH  Cognition Arousal/Alertness: Awake/alert Behavior During Therapy: WFL for tasks assessed/performed Overall Cognitive Status: Within Functional Limits for tasks assessed  General Comments      Exercises     Assessment/Plan    PT Assessment Patient needs continued PT services  PT Problem List Decreased strength;Decreased range of motion;Decreased activity tolerance;Decreased balance;Decreased knowledge of use of DME;Decreased cognition;Decreased mobility;Decreased safety awareness;Decreased knowledge of precautions;Pain       PT Treatment Interventions DME instruction;Gait training;Functional mobility training;Therapeutic exercise;Therapeutic activities;Patient/family education;Balance training    PT Goals (Current goals can be found in the Care Plan section)  Acute Rehab PT Goals Patient  Stated Goal: none stated PT Goal Formulation: Patient unable to participate in goal setting Potential to Achieve Goals: Good    Frequency Min 3X/week   Barriers to discharge        Co-evaluation               AM-PAC PT "6 Clicks" Daily Activity  Outcome Measure Difficulty turning over in bed (including adjusting bedclothes, sheets and blankets)?: Unable Difficulty moving from lying on back to sitting on the side of the bed? : Unable Difficulty sitting down on and standing up from a chair with arms (e.g., wheelchair, bedside commode, etc,.)?: Unable Help needed moving to and from a bed to chair (including a wheelchair)?: Total Help needed walking in hospital room?: Total Help needed climbing 3-5 steps with a railing? : Total 6 Click Score: 6    End of Session   Activity Tolerance: Treatment limited secondary to agitation Patient left: in bed;with call bell/phone within reach;with bed alarm set Nurse Communication: Mobility status PT Visit Diagnosis: Muscle weakness (generalized) (M62.81);Pain Pain - Right/Left: Right Pain - part of body: Hip    Time: 3545-6256 PT Time Calculation (min) (ACUTE ONLY): 15 min   Charges:   PT Evaluation $PT Eval Moderate Complexity: 1 Mod     PT G CodesKenyon Ana, PT Pager: [phone] 06/08/2017   [facility] 06/08/2017, 11:32 AM

## 2017-06-08 NOTE — Progress Notes (Signed)
Plan for d/c to SNF, discharge planning per CSW. [phone] for d/c to SNF, discharge planning per CSW. [phone]

## 2017-06-08 NOTE — Discharge Summary (Addendum)
Discharge Summary  [name] [name] UEA:[id number] DOB: [birth year]  PCP: Marin Olp, MD  Admit date: 06/06/2017 Discharge date: 06/09/2017  Time spent: >53mns, more than 50% time spent on coordination of care.  Recommendations for Outpatient Follow-up:  1. F/u with SNF MD for hospital discharge follow up 2. F/u with ortho Dr AWynelle Link Discharge Diagnoses:  Active Hospital Problems   Diagnosis Date Noted  . [place] [place]  . [place] 06/[place]  . DNR (do not resuscitate) 07/16/2016  . History of breast cancer, T1b, N0, Lumpectomy 07/21/2007. 04/21/2011  . COPD with emphysema Gold B 01/04/2010  . Essential hypertension 11/12/2006    Resolved Hospital Problems  No resolved problems to display.    Discharge Condition: stable  Diet recommendation: regular diet  Filed Weights   06/06/17 0500  Weight: 40.8 kg (90 lb)    History of present illness: (per admitting MD) PCP: HMarin Olp MD  Patient coming from: Skilled nursing facility.  Chief Complaint: Fall.  HPI: MMarvie Brevikis a 84y.o. female with history of COPD, hypertension presently not on medications had a fall at the skilled nursing facility after slipping on her socks.  Patient denies hitting her head or losing consciousness.  Denies any chest pain or shortness of breath.  ED Course: In the ER x-rays revealed right hip fracture and on-call orthopedic surgeon Dr. AElmyra Rickswas consulted.  Patient is being admitted for further management of right hip fracture.  As per patient's daughter was at the bedside patient has a difficult airway and there is concern about any surgery requiring intubation.  At the time of my exam patient is not wheezing and denies any chest pain or shortness of breath.    Hospital Course:  Principal Problem:   [facility] Active Problems:   Essential hypertension   COPD with emphysema Gold  B   History of breast cancer, T1b, N0, Lumpectomy 07/21/2007.   DNR (do not resuscitate)   [place]  Right hip fracture status post mechanical fall -preop eval by pulmonology cleared patient to undergo surgery with spinal anesthesia due to h/o pharyngeal stenosis.  -she underwent Intramedullary nailing,Rightintertrochanteric femur fracture with spinal anesthesia on 1/27 - post op pain control, wound care, weight bearing status and dvt prophylasix per ortho. -case discussed with ortho PA Mr DArlee Muslimwho recommend patient to received lovenox on 1/28 and 1/29, resume home dose apixaban on 1/30.  H/o RML PE -04/25/2016 CTA chest-- Resume  apixaban on 1/30.   H/o copd/astham/pulmonary fibrosis h/o pulmonary MAC:  Stable, pulmonary consulted, input appreciated  H/o CAD, MVP, EKG with sinus rhythm Stable, no chest pain  H/o HTN; not on meds at home, on prn hydralazine here.  Normocytic normochromic anemia-hgb stable.   Dementia: at baseline. She has been on hospice previously.  Body mass index is 16.46 kg/m.   History of breast cancer in remission.   Code Status: DNR  Family Communication: patient   Disposition Plan: return to SNF    Consultants:  Ortho  Social worker  Procedures: Intramedullary nailing,Rightintertrochanteric femur fracture on 1/27 by ortho Dr AWynelle Link Antibiotics:  Perioperative cefazolin     Discharge Exam: BP (!) 131/50 (BP Location: Left Arm)   Pulse 77   Temp 99.1 F (37.3 C) (Oral)   Resp 15   Ht _0  (1.575 m)   Wt 40.8 kg (90 lb)   SpO2 99%  BMI 16.46 kg/m   General: demented elderly, NAD Cardiovascular: RRR Respiratory: CTABL Musculoskeletal: right hip post op changes      Discharge Instructions You were cared for by a hospitalist during your hospital stay. If you have any questions about your discharge medications or the care you received while you were in the hospital after  you are discharged, you can call the unit and asked to speak with the hospitalist on call if the hospitalist that took care of you is not available. Once you are discharged, your primary care physician will handle any further medical issues. Please note that NO REFILLS for any discharge medications will be authorized once you are discharged, as it is imperative that you return to your primary care physician (or establish a relationship with a primary care physician if you do not have one) for your aftercare needs so that they can reassess your need for medications and monitor your lab values.  Discharge Instructions    Diet general   Complete by:  As directed    Diet general   Complete by:  As directed    Increase activity slowly   Complete by:  As directed    Increase activity slowly   Complete by:  As directed      Allergies as of 06/09/2017      Reactions   Azithromycin Hives, Diarrhea, Dermatitis, Rash   Ciprofloxacin Swelling   Levaquin [levofloxacin] Hives   Penicillins Hives   has had mild doses that she did not have reactions to Has patient had a PCN reaction causing immediate rash, facial/tongue/throat swelling, SOB or lightheadedness with hypotension:Yes Has patient had a PCN reaction causing severe rash involving mucus membranes or skin necrosis: No Has patient had a PCN reaction that required hospitalization: no Has patient had a PCN reaction occurring within the last 10 years: no If all of the above answers are "NO", then may proceed with Cephalosporin use.   Sulfamethoxazole Nausea And Vomiting      Medication List    STOP taking these medications   diazepam 5 MG tablet Commonly known as:  VALIUM     TAKE these medications   acetaminophen 325 MG tablet Commonly known as:  TYLENOL Take 2 tablets (650 mg total) by mouth every 6 (six) hours as needed for mild pain (or Fever >/= 101).   albuterol 108 (90 Base) MCG/ACT inhaler Commonly known as:  PROVENTIL  HFA;VENTOLIN HFA Inhale 2 puffs into the lungs every 6 (six) hours as needed for wheezing or shortness of breath.   apixaban 5 MG Tabs tablet Commonly known as:  ELIQUIS Take 1 tablet (5 mg total) by mouth 2 (two) times daily. Start on 05/05/2016 Start taking on:  06/10/2017   bisacodyl 10 MG suppository Commonly known as:  DULCOLAX Place 1 suppository (10 mg total) rectally daily as needed for moderate constipation.   budesonide-formoterol 160-4.5 MCG/ACT inhaler Commonly known as:  SYMBICORT Inhale 2 puffs into the lungs 2 (two) times daily.   enoxaparin 30 MG/0.3ML injection Commonly known as:  LOVENOX Inject 0.3 mLs (30 mg total) into the skin daily for 1 day.   fluticasone 50 MCG/ACT nasal spray Commonly known as:  FLONASE Place 2 sprays into both nostrils daily. What changed:  when to take this   HYDROcodone-acetaminophen 5-325 MG tablet Commonly known as:  NORCO/VICODIN Take 1 tablet by mouth every 6 (six) hours as needed for moderate pain.   mirtazapine 7.5 MG tablet Commonly known as:  REMERON Take  1 tablet (7.5 mg total) by mouth at bedtime. What changed:  how much to take   nitroGLYCERIN 0.4 MG SL tablet Commonly known as:  NITROSTAT Place 1 tablet (0.4 mg total) under the tongue every 5 (five) minutes as needed for chest pain (take as directed).   ondansetron 4 MG tablet Commonly known as:  ZOFRAN TAKE 1 TABLET (4 MG TOTAL) BY MOUTH EVERY 8 (EIGHT) HOURS AS NEEDED FOR NAUSEA OR VOMITING.   polyethylene glycol packet Commonly known as:  MIRALAX / GLYCOLAX Take 17 g by mouth daily as needed for mild constipation.   senna-docusate 8.6-50 MG tablet Commonly known as:  Senokot-S Take 1 tablet by mouth daily.   sodium phosphate 7-19 GM/118ML Enem Place 133 mLs (1 enema total) rectally daily as needed for severe constipation.      Allergies  Allergen Reactions  . Azithromycin Hives, Diarrhea, Dermatitis and Rash  . Ciprofloxacin Swelling  . Levaquin  [Levofloxacin] Hives  . Penicillins Hives    has had mild doses that she did not have reactions to  Has patient had a PCN reaction causing immediate rash, facial/tongue/throat swelling, SOB or lightheadedness with hypotension:Yes Has patient had a PCN reaction causing severe rash involving mucus membranes or skin necrosis: No Has patient had a PCN reaction that required hospitalization: no Has patient had a PCN reaction occurring within the last 10 years: no If all of the above answers are "NO", then may proceed with Cephalosporin use.   . Sulfamethoxazole Nausea And Vomiting   Contact information for after-discharge care    Destination    [facility] .   Service:  Skilled Nursing Contact information: [address] [place] [phone]               The results of significant diagnostics from this hospitalization (including imaging, microbiology, ancillary and laboratory) are listed below for reference.    Significant Diagnostic Studies: Dg Chest 2 View  Result Date: 06/06/2017 CLINICAL DATA:  Right hip swelling and pain after a fall. History of COPD, emphysema, hypertension, atrial fibrillation, former smoker. EXAM: CHEST  2 VIEW COMPARISON:  CT chest 04/25/2016.  Chest radiograph 04/24/2016. FINDINGS: Hyperinflation consistent with emphysema. Interstitial fibrosis and bronchial wall thickening consistent with chronic bronchitis. Left upper lung nodule measuring 1.7 cm diameter. This is shown to represent a calcified granuloma on the previous CT. No focal consolidation or airspace disease. Cardiac enlargement. No vascular congestion or edema. Compression of midthoracic vertebrae similar to prior study. Calcification of the aorta. Postoperative right mastectomy with surgical clips in the right axilla. IMPRESSION: Emphysematous and chronic bronchitic changes in the lungs. Stable appearance of a left lung nodule consistent with  calcified granuloma. Cardiac enlargement. No focal consolidation or edema. Aortic atherosclerosis. Electronically Signed   By: Lucienne Capers M.D.   On: 06/06/2017 02:12   Dg C-arm 1-60 Min-no Report  Result Date: 06/07/2017 CLINICAL DATA:  84 year old female with history of hip fixation status post fracture EXAM: OPERATIVE RIGHT HIP (WITH PELVIS IF PERFORMED)  VIEWS TECHNIQUE: Fluoroscopic spot image(s) were submitted for interpretation post-operatively. COMPARISON:  None. FINDINGS: Intraoperative fluoroscopic spot images of ORIF right hip. Surgical changes of antegrade intramedullary rod of the right femur with cannulated interlocking screw of the right femoral neck and single distal interlocking screw. No immediate complicating features. IMPRESSION: Limited intraoperative fluoroscopic spot images of right hip ORIF with antegrade intramedullary rod placement, interlocking cannulated screw the femoral neck, and single interlocking screw distally  without complicating feature. Please refer to the dictated operative report for full details of intraoperative findings and procedure. Electronically Signed   By: Corrie Mckusick D.O.   On: 06/07/2017 09:12   Dg Hip Operative Unilat With Pelvis Right  Result Date: 06/07/2017 CLINICAL DATA:  84 year old female with history of hip fixation status post fracture EXAM: OPERATIVE RIGHT HIP (WITH PELVIS IF PERFORMED)  VIEWS TECHNIQUE: Fluoroscopic spot image(s) were submitted for interpretation post-operatively. COMPARISON:  None. FINDINGS: Intraoperative fluoroscopic spot images of ORIF right hip. Surgical changes of antegrade intramedullary rod of the right femur with cannulated interlocking screw of the right femoral neck and single distal interlocking screw. No immediate complicating features. IMPRESSION: Limited intraoperative fluoroscopic spot images of right hip ORIF with antegrade intramedullary rod placement, interlocking cannulated screw the femoral neck, and  single interlocking screw distally without complicating feature. Please refer to the dictated operative report for full details of intraoperative findings and procedure. Electronically Signed   By: Corrie Mckusick D.O.   On: 06/07/2017 09:12   Dg Hip Unilat W Or Wo Pelvis 2-3 Views Right  Result Date: 06/06/2017 CLINICAL DATA:  Right hip pain after a fall. EXAM: DG HIP (WITH OR WITHOUT PELVIS) 2-3V RIGHT COMPARISON:  None. FINDINGS: Inter trochanteric fracture of the proximal right femur with mild varus angulation. No significant displacement. Mild impaction. No dislocation of the right hip. Degenerative changes are seen in the lower lumbar spine and in both hips. Pelvis appears intact. Vascular calcifications are present. IMPRESSION: Inter trochanteric fracture of the right proximal femur with mild impaction and varus angulation. Electronically Signed   By: Lucienne Capers M.D.   On: 06/06/2017 02:13    Microbiology: Recent Results (from the past 240 hour(s))  MRSA PCR Screening     Status: None   Collection Time: 06/06/17 10:32 PM  Result Value Ref Range Status   MRSA by PCR NEGATIVE NEGATIVE Final    Comment:        The GeneXpert MRSA Assay (FDA approved for NASAL specimens only), is one component of a comprehensive MRSA colonization surveillance program. It is not intended to diagnose MRSA infection nor to guide or monitor treatment for MRSA infections.      Labs: Basic Metabolic Panel: Recent Labs  Lab 06/06/17 0139 06/07/17 0511 06/08/17 0529 06/09/17 0545  NA 139 135 136 135  K 3.8 4.2 4.2 4.1  CL 100* 102 103 102  CO2  --  _0 GLUCOSE 152* 112* 112* 119*  BUN _1 CREATININE 0.70 0.62 0.60 0.53  CALCIUM  --  8.4* 7.9* 7.9*  MG  --  1.7  --   --    Liver Function Tests: No results for input(s): AST, ALT, ALKPHOS, BILITOT, PROT, ALBUMIN in the last 168 hours. No results for input(s): LIPASE, AMYLASE in the last 168 hours. No results for input(s):  AMMONIA in the last 168 hours. CBC: Recent Labs  Lab 06/06/17 0131 06/06/17 0139 06/07/17 0511 06/08/17 0529 06/09/17 0545  WBC 14.3*  --  8.2 9.0 7.4  NEUTROABS 12.4*  --  5.4  --   --   HGB 11.4* 11.9* 10.5* 9.2* 8.4*  HCT 34.5* 35.0* 31.0* 28.0* 24.8*  MCV 89.1  --  89.9 88.6 89.9  PLT 174  --  137* 74* 119*   Cardiac Enzymes: No results for input(s): CKTOTAL, CKMB, CKMBINDEX, TROPONINI in the last 168 hours. BNP: BNP (last 3 results) No results for input(s): BNP in  the last 8760 hours.  ProBNP (last 3 results) No results for input(s): PROBNP in the last 8760 hours.  CBG: No results for input(s): GLUCAP in the last 168 hours.     Signed:  Florencia Reasons MD, PhD  [facility] 06/09/2017, 9:29 AM

## 2017-06-08 NOTE — Progress Notes (Signed)
OT Cancellation Note and Discharge  Patient Details Name: [name] [name] MRN: [id number] DOB: [birth year]   Cancelled Treatment:    Reason Eval/Treat Not Completed: Other (comment) Pt is Medicare and current D/C plan is back to SNF under hospice. No apparent immediate acute care OT needs, therefore will defer OT to SNF. If OT eval is needed please call Acute Rehab Dept. at [phone] or text page OT at [phone].    Almon Register [phone] 06/08/2017, 7:02 AM

## 2017-06-09 ENCOUNTER — Encounter (HOSPITAL_COMMUNITY): Payer: Self-pay

## 2017-06-09 DIAGNOSIS — J449 Chronic obstructive pulmonary disease, unspecified: Secondary | ICD-10-CM | POA: Diagnosis not present

## 2017-06-09 DIAGNOSIS — R1310 Dysphagia, unspecified: Secondary | ICD-10-CM | POA: Diagnosis not present

## 2017-06-09 DIAGNOSIS — R2689 Other abnormalities of gait and mobility: Secondary | ICD-10-CM | POA: Diagnosis not present

## 2017-06-09 DIAGNOSIS — Z9181 History of falling: Secondary | ICD-10-CM | POA: Diagnosis not present

## 2017-06-09 DIAGNOSIS — R41841 Cognitive communication deficit: Secondary | ICD-10-CM | POA: Diagnosis not present

## 2017-06-09 DIAGNOSIS — E46 Unspecified protein-calorie malnutrition: Secondary | ICD-10-CM | POA: Diagnosis not present

## 2017-06-09 DIAGNOSIS — S72001A Fracture of unspecified part of neck of right femur, initial encounter for closed fracture: Secondary | ICD-10-CM | POA: Diagnosis not present

## 2017-06-09 DIAGNOSIS — G8911 Acute pain due to trauma: Secondary | ICD-10-CM | POA: Diagnosis not present

## 2017-06-09 DIAGNOSIS — R627 Adult failure to thrive: Secondary | ICD-10-CM | POA: Diagnosis not present

## 2017-06-09 DIAGNOSIS — D509 Iron deficiency anemia, unspecified: Secondary | ICD-10-CM | POA: Diagnosis not present

## 2017-06-09 DIAGNOSIS — F0390 Unspecified dementia without behavioral disturbance: Secondary | ICD-10-CM | POA: Diagnosis not present

## 2017-06-09 DIAGNOSIS — G4733 Obstructive sleep apnea (adult) (pediatric): Secondary | ICD-10-CM | POA: Diagnosis not present

## 2017-06-09 DIAGNOSIS — G309 Alzheimer's disease, unspecified: Secondary | ICD-10-CM | POA: Diagnosis not present

## 2017-06-09 DIAGNOSIS — Z8673 Personal history of transient ischemic attack (TIA), and cerebral infarction without residual deficits: Secondary | ICD-10-CM | POA: Diagnosis not present

## 2017-06-09 DIAGNOSIS — S72001D Fracture of unspecified part of neck of right femur, subsequent encounter for closed fracture with routine healing: Secondary | ICD-10-CM | POA: Diagnosis not present

## 2017-06-09 DIAGNOSIS — F329 Major depressive disorder, single episode, unspecified: Secondary | ICD-10-CM | POA: Diagnosis not present

## 2017-06-09 DIAGNOSIS — S79911A Unspecified injury of right hip, initial encounter: Secondary | ICD-10-CM | POA: Diagnosis not present

## 2017-06-09 DIAGNOSIS — S72141D Displaced intertrochanteric fracture of right femur, subsequent encounter for closed fracture with routine healing: Secondary | ICD-10-CM | POA: Diagnosis not present

## 2017-06-09 DIAGNOSIS — I2609 Other pulmonary embolism with acute cor pulmonale: Secondary | ICD-10-CM | POA: Diagnosis not present

## 2017-06-09 DIAGNOSIS — Z4789 Encounter for other orthopedic aftercare: Secondary | ICD-10-CM | POA: Diagnosis not present

## 2017-06-09 DIAGNOSIS — S72144D Nondisplaced intertrochanteric fracture of right femur, subsequent encounter for closed fracture with routine healing: Secondary | ICD-10-CM | POA: Diagnosis not present

## 2017-06-09 LAB — CBC
HEMATOCRIT: 24.8 % — AB (ref 36.0–46.0)
Hemoglobin: 8.4 g/dL — ABNORMAL LOW (ref 12.0–15.0)
MCH: 30.4 pg (ref 26.0–34.0)
MCHC: 33.9 g/dL (ref 30.0–36.0)
MCV: 89.9 fL (ref 78.0–100.0)
Platelets: 119 10*3/uL — ABNORMAL LOW (ref 150–400)
RBC: 2.76 MIL/uL — AB (ref 3.87–5.11)
RDW: 13.7 % (ref 11.5–15.5)
WBC: 7.4 10*3/uL (ref 4.0–10.5)

## 2017-06-09 LAB — BASIC METABOLIC PANEL
Anion gap: 4 — ABNORMAL LOW (ref 5–15)
BUN: 10 mg/dL (ref 6–20)
CHLORIDE: 102 mmol/L (ref 101–111)
CO2: 29 mmol/L (ref 22–32)
Calcium: 7.9 mg/dL — ABNORMAL LOW (ref 8.9–10.3)
Creatinine, Ser: 0.53 mg/dL (ref 0.44–1.00)
GFR calc Af Amer: >60 mL/min (ref >60)
GFR calc non Af Amer: >60 mL/min (ref >60)
GLUCOSE: 119 mg/dL — AB (ref 65–99)
POTASSIUM: 4.1 mmol/L (ref 3.5–5.1)
Sodium: 135 mmol/L (ref 135–145)

## 2017-06-09 NOTE — Progress Notes (Signed)
Attempted to called report twice to [facility] for patient transfer. Patient is from the facility and I spoke with the charge nurse this morning explaining medication changes but received voicemail when I tried to call back. Will try again.

## 2017-06-09 NOTE — Progress Notes (Addendum)
CSW confirmed facility is ready for the patient to return [facility]. Patient will go to room 4012.  Nurse given number to call report.  D/C Summary sent. DNR placed.  CSW notified the patient daughter, she plans to meet the patient at facility SNF this evening.  12:40Pm [facility] called for transport.   Kathrin Greathouse, Latanya Presser, MSW  Clinical Social Worker  [phone] 06/09/2017  12:37 PM

## 2017-06-09 NOTE — Care Management Important Message (Signed)
Important Message  Patient Details  Name: [name] [name] MRN: [id number] Date of Birth: [birth year]   Medicare Important Message Given:  Yes    Kerin Salen 06/09/2017, 10:09 AMImportant Message  Patient Details  Name: [name] [name] MRN: [id number] Date of Birth: Aug 24, 1923   Medicare Important Message Given:  Yes    Kerin Salen 06/09/2017, 10:09 AM

## 2017-06-09 NOTE — Progress Notes (Signed)
Subjective: 2 Days Post-Op Procedure(s) (LRB): INTRAMEDULLARY (IM) NAIL INTERTROCHANTRIC (Right) Patient alert this morning but pleasantly confused.  She does point to her right leg when asked if she is in pain. Patient seen in rounds for Dr. Wynelle Link. Patient is well, but has had some minor complaints of pain in the right leg that she points to, requiring pain medications We will resume therapy today.  Plan is to go Skilled nursing facility after hospital stay.  Objective: Vital signs in last 24 hours: Temp:  [97.8 F (36.6 C)-99.1 F (37.3 C)] 99.1 F (37.3 C) (01/29 7654) Pulse Rate:  [77-83] 77 (01/29 0637) Resp:  [15-18] 15 (01/29 0637) BP: (94-131)/(49-52) 131/50 (01/29 0637) SpO2:  [96 %-99 %] 99 % (01/29 0637)  Intake/Output from previous day: 01/28 0701 - 01/29 0700 In: 2305 [P.O.:450; I.V.:1800; IV Piggyback:55] Out: 300 [Urine:300] Intake/Output this shift: Total I/O In: 60 [P.O.:60] Out: 150 [Urine:150]  Recent Labs    06/07/17 0511 06/08/17 0529 06/09/17 0545  HGB 10.5* 9.2* 8.4*   Recent Labs    06/08/17 0529 06/09/17 0545  WBC 9.0 7.4  RBC 3.16* 2.76*  HCT 28.0* 24.8*  PLT 74* 119*   Recent Labs    06/08/17 0529 06/09/17 0545  NA 136 135  K 4.2 4.1  CL 103 102  CO2 29 29  BUN 13 10  CREATININE 0.60 0.53  GLUCOSE 112* 119*  CALCIUM 7.9* 7.9*   No results for input(s): LABPT, INR in the last 72 hours.  EXAM General - Patient is Alert Extremity - Neurovascular intact Sensation intact distally Dressing - dressing C/D/I, Incisions look good and dressings are changed on rounds. Motor Function - intact, moving foot and toes well on exam.   Past Medical History:  Diagnosis Date  . Airway obstruction   . Allergic rhinitis   . Anal stricture   . ANXIETY 08/16/2008  . Atrial fibrillation ([place])    she denies known hx of afib 02/24/13  . CAD, UNSPECIFIED SITE 10/17/2008  . Candida esophagitis ([place])   . CHOLELITHIASIS 08/23/2008  . Chronic  constipation   . Colitis    sigmoid  . Complication of anesthesia    difficult intubation  . COPD 01/04/2010   FeV1 101%, DLCO64% 2008  . COPD (chronic obstructive pulmonary disease) ([place]) 01/04/2010  . 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 Diverticulitis   . DIVERTICULOSIS-COLON 04/07/2008  . Emphysema of lung ([place])   . Esophageal stricture   . Fatty liver   . GERD 04/19/2007  . Hearing aid worn    bilateral  . Hiatal hernia   . History of diverticulitis of colon   . HYPERSOMNIA, ASSOCIATED WITH SLEEP APNEA 02/01/2007  . HYPERTENSION 11/12/2006  . IBS (irritable bowel syndrome)   . MITRAL VALVE PROLAPSE, HX OF 10/07/2008  . NEOPLASM, MALIGNANT, BREAST, RIGHT 02/15/2008  . OSTEOPOROSIS 04/04/2008  . Pulmonary fibrosis ([place])   . Raynaud's syndrome 04/25/2009  . Sleep apnea   . Tortuous colon   . UTI (urinary tract infection)     Assessment/Plan: 2 Days Post-Op Procedure(s) (LRB): INTRAMEDULLARY (IM) NAIL INTERTROCHANTRIC (Right) Principal Problem:   Closed right hip fracture, initial encounter [facility]) Active Problems:   Essential hypertension   COPD with emphysema Gold B   History of breast cancer, T1b, N0, Lumpectomy 07/21/2007.   DNR (do not resuscitate)   Hip fracture ([place])  Estimated body mass index is 16.46 kg/m as calculated from the following:   Height as of this encounter:  5\' 2"  (1.575 m).   Weight as of this encounter: 40.8 kg (90 lb). Up with therapy  DVT Prophylaxis - Lovenox for today (for 48 hours folloiwing surgery) and then switch back to her Eliquis dosing. Weight-Bearing as tolerated to right leg D/C O2 and Pulse OX and try on Room Air  Arlee Muslim, PA-C Orthopaedic Surgery 06/09/2017, 9:10 AM

## 2017-06-12 DIAGNOSIS — J449 Chronic obstructive pulmonary disease, unspecified: Secondary | ICD-10-CM | POA: Diagnosis not present

## 2017-06-12 DIAGNOSIS — S72141D Displaced intertrochanteric fracture of right femur, subsequent encounter for closed fracture with routine healing: Secondary | ICD-10-CM | POA: Diagnosis not present

## 2017-06-12 DIAGNOSIS — D509 Iron deficiency anemia, unspecified: Secondary | ICD-10-CM | POA: Diagnosis not present

## 2017-06-12 DIAGNOSIS — R627 Adult failure to thrive: Secondary | ICD-10-CM | POA: Diagnosis not present

## 2017-06-23 DIAGNOSIS — S72144D Nondisplaced intertrochanteric fracture of right femur, subsequent encounter for closed fracture with routine healing: Secondary | ICD-10-CM | POA: Diagnosis not present

## 2017-06-23 DIAGNOSIS — Z4789 Encounter for other orthopedic aftercare: Secondary | ICD-10-CM | POA: Diagnosis not present

## 2017-07-01 DIAGNOSIS — F0390 Unspecified dementia without behavioral disturbance: Secondary | ICD-10-CM | POA: Diagnosis not present

## 2017-07-01 DIAGNOSIS — F329 Major depressive disorder, single episode, unspecified: Secondary | ICD-10-CM | POA: Diagnosis not present

## 2017-07-14 DIAGNOSIS — S7221XD Displaced subtrochanteric fracture of right femur, subsequent encounter for closed fracture with routine healing: Secondary | ICD-10-CM | POA: Diagnosis not present

## 2017-07-14 DIAGNOSIS — Z4789 Encounter for other orthopedic aftercare: Secondary | ICD-10-CM | POA: Diagnosis not present

## 2017-07-30 DIAGNOSIS — F0390 Unspecified dementia without behavioral disturbance: Secondary | ICD-10-CM | POA: Diagnosis not present

## 2017-07-30 DIAGNOSIS — E46 Unspecified protein-calorie malnutrition: Secondary | ICD-10-CM | POA: Diagnosis not present

## 2017-07-30 DIAGNOSIS — D649 Anemia, unspecified: Secondary | ICD-10-CM | POA: Diagnosis not present

## 2017-07-30 DIAGNOSIS — J305 Allergic rhinitis due to food: Secondary | ICD-10-CM | POA: Diagnosis not present

## 2017-08-18 DIAGNOSIS — F0390 Unspecified dementia without behavioral disturbance: Secondary | ICD-10-CM | POA: Diagnosis not present

## 2017-08-18 DIAGNOSIS — F329 Major depressive disorder, single episode, unspecified: Secondary | ICD-10-CM | POA: Diagnosis not present

## 2017-08-24 DIAGNOSIS — J449 Chronic obstructive pulmonary disease, unspecified: Secondary | ICD-10-CM | POA: Diagnosis not present

## 2017-08-24 DIAGNOSIS — D509 Iron deficiency anemia, unspecified: Secondary | ICD-10-CM | POA: Diagnosis not present

## 2017-08-24 DIAGNOSIS — Z9181 History of falling: Secondary | ICD-10-CM | POA: Diagnosis not present

## 2017-08-24 DIAGNOSIS — R627 Adult failure to thrive: Secondary | ICD-10-CM | POA: Diagnosis not present

## 2017-09-01 DIAGNOSIS — Z4789 Encounter for other orthopedic aftercare: Secondary | ICD-10-CM | POA: Diagnosis not present

## 2017-09-01 DIAGNOSIS — S72141D Displaced intertrochanteric fracture of right femur, subsequent encounter for closed fracture with routine healing: Secondary | ICD-10-CM | POA: Diagnosis not present

## 2017-09-22 DIAGNOSIS — Z9181 History of falling: Secondary | ICD-10-CM | POA: Diagnosis not present

## 2017-09-22 DIAGNOSIS — F0390 Unspecified dementia without behavioral disturbance: Secondary | ICD-10-CM | POA: Diagnosis not present

## 2017-09-22 DIAGNOSIS — D649 Anemia, unspecified: Secondary | ICD-10-CM | POA: Diagnosis not present

## 2017-11-17 DIAGNOSIS — F0390 Unspecified dementia without behavioral disturbance: Secondary | ICD-10-CM | POA: Diagnosis not present

## 2017-11-17 DIAGNOSIS — F329 Major depressive disorder, single episode, unspecified: Secondary | ICD-10-CM | POA: Diagnosis not present

## 2017-11-25 DIAGNOSIS — R627 Adult failure to thrive: Secondary | ICD-10-CM | POA: Diagnosis not present

## 2017-11-25 DIAGNOSIS — J449 Chronic obstructive pulmonary disease, unspecified: Secondary | ICD-10-CM | POA: Diagnosis not present

## 2017-11-25 DIAGNOSIS — D509 Iron deficiency anemia, unspecified: Secondary | ICD-10-CM | POA: Diagnosis not present

## 2017-11-25 DIAGNOSIS — Z9181 History of falling: Secondary | ICD-10-CM | POA: Diagnosis not present

## 2017-12-01 DIAGNOSIS — F0390 Unspecified dementia without behavioral disturbance: Secondary | ICD-10-CM | POA: Diagnosis not present

## 2017-12-01 DIAGNOSIS — F329 Major depressive disorder, single episode, unspecified: Secondary | ICD-10-CM | POA: Diagnosis not present

## 2017-12-15 DIAGNOSIS — F0390 Unspecified dementia without behavioral disturbance: Secondary | ICD-10-CM | POA: Diagnosis not present

## 2017-12-15 DIAGNOSIS — F329 Major depressive disorder, single episode, unspecified: Secondary | ICD-10-CM | POA: Diagnosis not present

## 2018-02-17 DIAGNOSIS — Z23 Encounter for immunization: Secondary | ICD-10-CM | POA: Diagnosis not present

## 2018-04-02 DIAGNOSIS — J309 Allergic rhinitis, unspecified: Secondary | ICD-10-CM | POA: Diagnosis not present

## 2018-04-02 DIAGNOSIS — J449 Chronic obstructive pulmonary disease, unspecified: Secondary | ICD-10-CM | POA: Diagnosis not present

## 2018-04-02 DIAGNOSIS — E43 Unspecified severe protein-calorie malnutrition: Secondary | ICD-10-CM | POA: Diagnosis not present

## 2018-04-02 DIAGNOSIS — Z9181 History of falling: Secondary | ICD-10-CM | POA: Diagnosis not present

## 2018-04-02 DIAGNOSIS — R627 Adult failure to thrive: Secondary | ICD-10-CM | POA: Diagnosis not present

## 2018-04-06 DIAGNOSIS — N390 Urinary tract infection, site not specified: Secondary | ICD-10-CM | POA: Diagnosis not present

## 2018-04-06 DIAGNOSIS — F0390 Unspecified dementia without behavioral disturbance: Secondary | ICD-10-CM | POA: Diagnosis not present

## 2018-04-06 DIAGNOSIS — F329 Major depressive disorder, single episode, unspecified: Secondary | ICD-10-CM | POA: Diagnosis not present

## 2018-05-27 DIAGNOSIS — F0390 Unspecified dementia without behavioral disturbance: Secondary | ICD-10-CM | POA: Diagnosis not present

## 2018-05-27 DIAGNOSIS — D649 Anemia, unspecified: Secondary | ICD-10-CM | POA: Diagnosis not present

## 2018-05-27 DIAGNOSIS — J449 Chronic obstructive pulmonary disease, unspecified: Secondary | ICD-10-CM | POA: Diagnosis not present

## 2018-05-27 DIAGNOSIS — E46 Unspecified protein-calorie malnutrition: Secondary | ICD-10-CM | POA: Diagnosis not present

## 2018-07-24 DIAGNOSIS — M25511 Pain in right shoulder: Secondary | ICD-10-CM | POA: Diagnosis not present

## 2018-07-24 DIAGNOSIS — M25531 Pain in right wrist: Secondary | ICD-10-CM | POA: Diagnosis not present

## 2018-07-24 DIAGNOSIS — M79621 Pain in right upper arm: Secondary | ICD-10-CM | POA: Diagnosis not present

## 2018-07-28 DIAGNOSIS — S52501A Unspecified fracture of the lower end of right radius, initial encounter for closed fracture: Secondary | ICD-10-CM | POA: Diagnosis not present

## 2018-07-28 DIAGNOSIS — M25531 Pain in right wrist: Secondary | ICD-10-CM | POA: Diagnosis not present

## 2018-08-06 DIAGNOSIS — F419 Anxiety disorder, unspecified: Secondary | ICD-10-CM | POA: Diagnosis not present

## 2018-08-06 DIAGNOSIS — G301 Alzheimer's disease with late onset: Secondary | ICD-10-CM | POA: Diagnosis not present

## 2018-08-06 DIAGNOSIS — F0280 Dementia in other diseases classified elsewhere without behavioral disturbance: Secondary | ICD-10-CM | POA: Diagnosis not present

## 2018-08-06 DIAGNOSIS — R627 Adult failure to thrive: Secondary | ICD-10-CM | POA: Diagnosis not present

## 2018-08-06 DIAGNOSIS — D649 Anemia, unspecified: Secondary | ICD-10-CM | POA: Diagnosis not present

## 2018-08-11 DIAGNOSIS — R0602 Shortness of breath: Secondary | ICD-10-CM | POA: Diagnosis not present

## 2018-08-11 DIAGNOSIS — R4182 Altered mental status, unspecified: Secondary | ICD-10-CM | POA: Diagnosis not present

## 2018-08-11 DIAGNOSIS — I10 Essential (primary) hypertension: Secondary | ICD-10-CM | POA: Diagnosis not present

## 2018-08-24 DIAGNOSIS — M25531 Pain in right wrist: Secondary | ICD-10-CM | POA: Diagnosis not present

## 2018-08-24 DIAGNOSIS — S52501A Unspecified fracture of the lower end of right radius, initial encounter for closed fracture: Secondary | ICD-10-CM | POA: Diagnosis not present

## 2018-09-14 DIAGNOSIS — Z4789 Encounter for other orthopedic aftercare: Secondary | ICD-10-CM | POA: Diagnosis not present

## 2018-09-14 DIAGNOSIS — M25531 Pain in right wrist: Secondary | ICD-10-CM | POA: Diagnosis not present

## 2018-09-14 DIAGNOSIS — S6291XD Unspecified fracture of right wrist and hand, subsequent encounter for fracture with routine healing: Secondary | ICD-10-CM | POA: Diagnosis not present

## 2018-09-17 DIAGNOSIS — K5900 Constipation, unspecified: Secondary | ICD-10-CM | POA: Diagnosis not present

## 2018-09-17 DIAGNOSIS — D649 Anemia, unspecified: Secondary | ICD-10-CM | POA: Diagnosis not present

## 2018-09-17 DIAGNOSIS — E46 Unspecified protein-calorie malnutrition: Secondary | ICD-10-CM | POA: Diagnosis not present

## 2018-09-17 DIAGNOSIS — R627 Adult failure to thrive: Secondary | ICD-10-CM | POA: Diagnosis not present

## 2018-09-17 DIAGNOSIS — F419 Anxiety disorder, unspecified: Secondary | ICD-10-CM | POA: Diagnosis not present

## 2018-09-17 DIAGNOSIS — J449 Chronic obstructive pulmonary disease, unspecified: Secondary | ICD-10-CM | POA: Diagnosis not present

## 2018-09-17 DIAGNOSIS — S62101D Fracture of unspecified carpal bone, right wrist, subsequent encounter for fracture with routine healing: Secondary | ICD-10-CM | POA: Diagnosis not present

## 2018-09-17 DIAGNOSIS — F0390 Unspecified dementia without behavioral disturbance: Secondary | ICD-10-CM | POA: Diagnosis not present

## 2018-09-23 DIAGNOSIS — S52501A Unspecified fracture of the lower end of right radius, initial encounter for closed fracture: Secondary | ICD-10-CM | POA: Diagnosis not present

## 2018-12-01 DIAGNOSIS — L89152 Pressure ulcer of sacral region, stage 2: Secondary | ICD-10-CM | POA: Diagnosis not present

## 2018-12-03 DIAGNOSIS — R627 Adult failure to thrive: Secondary | ICD-10-CM | POA: Diagnosis not present

## 2018-12-03 DIAGNOSIS — G301 Alzheimer's disease with late onset: Secondary | ICD-10-CM | POA: Diagnosis not present

## 2018-12-03 DIAGNOSIS — J9610 Chronic respiratory failure, unspecified whether with hypoxia or hypercapnia: Secondary | ICD-10-CM | POA: Diagnosis not present

## 2018-12-03 DIAGNOSIS — L89159 Pressure ulcer of sacral region, unspecified stage: Secondary | ICD-10-CM | POA: Diagnosis not present

## 2018-12-03 DIAGNOSIS — F0280 Dementia in other diseases classified elsewhere without behavioral disturbance: Secondary | ICD-10-CM | POA: Diagnosis not present

## 2018-12-13 ENCOUNTER — Other Ambulatory Visit: Payer: Self-pay

## 2019-01-19 DIAGNOSIS — F0390 Unspecified dementia without behavioral disturbance: Secondary | ICD-10-CM | POA: Diagnosis not present

## 2019-01-19 DIAGNOSIS — E46 Unspecified protein-calorie malnutrition: Secondary | ICD-10-CM | POA: Diagnosis not present

## 2019-01-19 DIAGNOSIS — R627 Adult failure to thrive: Secondary | ICD-10-CM | POA: Diagnosis not present

## 2019-01-19 DIAGNOSIS — F419 Anxiety disorder, unspecified: Secondary | ICD-10-CM | POA: Diagnosis not present

## 2019-01-19 DIAGNOSIS — L89159 Pressure ulcer of sacral region, unspecified stage: Secondary | ICD-10-CM | POA: Diagnosis not present

## 2019-01-19 DIAGNOSIS — J449 Chronic obstructive pulmonary disease, unspecified: Secondary | ICD-10-CM | POA: Diagnosis not present

## 2019-02-08 DIAGNOSIS — F329 Major depressive disorder, single episode, unspecified: Secondary | ICD-10-CM | POA: Diagnosis not present

## 2019-02-08 DIAGNOSIS — R634 Abnormal weight loss: Secondary | ICD-10-CM | POA: Diagnosis not present

## 2019-02-08 DIAGNOSIS — F0390 Unspecified dementia without behavioral disturbance: Secondary | ICD-10-CM | POA: Diagnosis not present

## 2019-04-01 DIAGNOSIS — J449 Chronic obstructive pulmonary disease, unspecified: Secondary | ICD-10-CM | POA: Diagnosis not present

## 2019-04-01 DIAGNOSIS — F0390 Unspecified dementia without behavioral disturbance: Secondary | ICD-10-CM | POA: Diagnosis not present

## 2019-04-01 DIAGNOSIS — R627 Adult failure to thrive: Secondary | ICD-10-CM | POA: Diagnosis not present

## 2019-05-13 DEATH — deceased
